# Patient Record
Sex: Female | Born: 1960 | Race: Black or African American | Hispanic: No
Health system: Midwestern US, Community
[De-identification: ages and names within clinical notes are randomized; demographics above are authoritative.]

## PROBLEM LIST (undated history)

## (undated) DIAGNOSIS — T451X5A Adverse effect of antineoplastic and immunosuppressive drugs, initial encounter: Secondary | ICD-10-CM

## (undated) DIAGNOSIS — J9801 Acute bronchospasm: Secondary | ICD-10-CM

## (undated) DIAGNOSIS — R8290 Unspecified abnormal findings in urine: Secondary | ICD-10-CM

## (undated) DIAGNOSIS — J4521 Mild intermittent asthma with (acute) exacerbation: Secondary | ICD-10-CM

## (undated) DIAGNOSIS — G620 Drug-induced polyneuropathy: Secondary | ICD-10-CM

## (undated) DIAGNOSIS — Z794 Long term (current) use of insulin: Secondary | ICD-10-CM

## (undated) DIAGNOSIS — R928 Other abnormal and inconclusive findings on diagnostic imaging of breast: Secondary | ICD-10-CM

## (undated) DIAGNOSIS — R748 Abnormal levels of other serum enzymes: Secondary | ICD-10-CM

## (undated) DIAGNOSIS — E1140 Type 2 diabetes mellitus with diabetic neuropathy, unspecified: Secondary | ICD-10-CM

## (undated) DIAGNOSIS — Z1231 Encounter for screening mammogram for malignant neoplasm of breast: Secondary | ICD-10-CM

## (undated) DIAGNOSIS — G8929 Other chronic pain: Secondary | ICD-10-CM

## (undated) DIAGNOSIS — Z79899 Other long term (current) drug therapy: Secondary | ICD-10-CM

## (undated) DIAGNOSIS — R600 Localized edema: Secondary | ICD-10-CM

## (undated) DIAGNOSIS — M79645 Pain in left finger(s): Secondary | ICD-10-CM

## (undated) DIAGNOSIS — D61818 Other pancytopenia: Secondary | ICD-10-CM

## (undated) DIAGNOSIS — R2689 Other abnormalities of gait and mobility: Secondary | ICD-10-CM

## (undated) DIAGNOSIS — E1165 Type 2 diabetes mellitus with hyperglycemia: Secondary | ICD-10-CM

## (undated) DIAGNOSIS — Z5181 Encounter for therapeutic drug level monitoring: Principal | ICD-10-CM

## (undated) DIAGNOSIS — C9101 Acute lymphoblastic leukemia, in remission: Secondary | ICD-10-CM

## (undated) DIAGNOSIS — E785 Hyperlipidemia, unspecified: Secondary | ICD-10-CM

## (undated) DIAGNOSIS — I10 Essential (primary) hypertension: Secondary | ICD-10-CM

## (undated) DIAGNOSIS — G4700 Insomnia, unspecified: Secondary | ICD-10-CM

## (undated) DIAGNOSIS — E559 Vitamin D deficiency, unspecified: Secondary | ICD-10-CM

## (undated) DIAGNOSIS — IMO0001 Reserved for inherently not codable concepts without codable children: Secondary | ICD-10-CM

## (undated) DIAGNOSIS — E119 Type 2 diabetes mellitus without complications: Secondary | ICD-10-CM

## (undated) DIAGNOSIS — J45909 Unspecified asthma, uncomplicated: Secondary | ICD-10-CM

## (undated) HISTORY — PX: MYOMECTOMY: SHX85

## (undated) HISTORY — PX: LIPOSUCTION: SHX10

---

## 2015-07-11 ENCOUNTER — Observation Stay (HOSPITAL_COMMUNITY)
Admission: EM | Admit: 2015-07-11 | Discharge: 2015-07-12 | Disposition: A | Discharge status: Home / Self Care | Payer: BC Managed Care – PPO | Attending: Internal Medicine | Admitting: Internal Medicine

## 2015-07-11 ENCOUNTER — Encounter (HOSPITAL_COMMUNITY): Payer: Self-pay | Admitting: Neurology

## 2015-07-11 ENCOUNTER — Observation Stay (HOSPITAL_BASED_OUTPATIENT_CLINIC_OR_DEPARTMENT_OTHER): Payer: BC Managed Care – PPO

## 2015-07-11 ENCOUNTER — Emergency Department (HOSPITAL_COMMUNITY): Payer: BC Managed Care – PPO

## 2015-07-11 DIAGNOSIS — R0609 Other forms of dyspnea: Secondary | ICD-10-CM | POA: Insufficient documentation

## 2015-07-11 DIAGNOSIS — R9431 Abnormal electrocardiogram [ECG] [EKG]: Secondary | ICD-10-CM | POA: Diagnosis not present

## 2015-07-11 DIAGNOSIS — I110 Hypertensive heart disease with heart failure: Secondary | ICD-10-CM | POA: Diagnosis not present

## 2015-07-11 DIAGNOSIS — R0789 Other chest pain: Secondary | ICD-10-CM | POA: Diagnosis not present

## 2015-07-11 DIAGNOSIS — I517 Cardiomegaly: Secondary | ICD-10-CM

## 2015-07-11 DIAGNOSIS — R0602 Shortness of breath: Secondary | ICD-10-CM

## 2015-07-11 DIAGNOSIS — R0600 Dyspnea, unspecified: Secondary | ICD-10-CM

## 2015-07-11 DIAGNOSIS — R079 Chest pain, unspecified: Secondary | ICD-10-CM | POA: Diagnosis present

## 2015-07-11 DIAGNOSIS — I5030 Unspecified diastolic (congestive) heart failure: Secondary | ICD-10-CM | POA: Diagnosis not present

## 2015-07-11 DIAGNOSIS — Z794 Long term (current) use of insulin: Secondary | ICD-10-CM | POA: Insufficient documentation

## 2015-07-11 DIAGNOSIS — Z7984 Long term (current) use of oral hypoglycemic drugs: Secondary | ICD-10-CM

## 2015-07-11 DIAGNOSIS — J4520 Mild intermittent asthma, uncomplicated: Secondary | ICD-10-CM | POA: Diagnosis not present

## 2015-07-11 DIAGNOSIS — I10 Essential (primary) hypertension: Secondary | ICD-10-CM

## 2015-07-11 DIAGNOSIS — Z8249 Family history of ischemic heart disease and other diseases of the circulatory system: Secondary | ICD-10-CM | POA: Diagnosis not present

## 2015-07-11 DIAGNOSIS — E119 Type 2 diabetes mellitus without complications: Secondary | ICD-10-CM

## 2015-07-11 DIAGNOSIS — E1165 Type 2 diabetes mellitus with hyperglycemia: Secondary | ICD-10-CM | POA: Diagnosis not present

## 2015-07-11 DIAGNOSIS — J45909 Unspecified asthma, uncomplicated: Secondary | ICD-10-CM

## 2015-07-11 HISTORY — DX: Reserved for inherently not codable concepts without codable children: IMO0001

## 2015-07-11 HISTORY — DX: Unspecified asthma, uncomplicated: J45.909

## 2015-07-11 HISTORY — DX: Essential (primary) hypertension: I10

## 2015-07-11 HISTORY — DX: Type 2 diabetes mellitus without complications: E11.9

## 2015-07-11 LAB — BASIC METABOLIC PANEL
ANION GAP: 12 (ref 5–15)
BUN: 12 mg/dL (ref 6–20)
CALCIUM: 9.3 mg/dL (ref 8.9–10.3)
CO2: 28 mmol/L (ref 22–32)
Chloride: 100 mmol/L — ABNORMAL LOW (ref 101–111)
Creatinine, Ser: 0.94 mg/dL (ref 0.44–1.00)
GFR calc Af Amer: >60 mL/min (ref >60)
GLUCOSE: 197 mg/dL — AB (ref 65–99)
Potassium: 3.9 mmol/L (ref 3.5–5.1)
Sodium: 140 mmol/L (ref 135–145)

## 2015-07-11 LAB — TSH: TSH: 1.962 u[IU]/mL (ref 0.350–4.500)

## 2015-07-11 LAB — GLUCOSE, CAPILLARY
GLUCOSE-CAPILLARY: 237 mg/dL — AB (ref 65–99)
GLUCOSE-CAPILLARY: 221 mg/dL — AB (ref 65–99)

## 2015-07-11 LAB — CBC
HEMATOCRIT: 39.8 % (ref 36.0–46.0)
HEMOGLOBIN: 13.8 g/dL (ref 12.0–15.0)
MCH: 29.9 pg (ref 26.0–34.0)
MCHC: 34.7 g/dL (ref 30.0–36.0)
MCV: 86.1 fL (ref 78.0–100.0)
PLATELETS: 216 10*3/uL (ref 150–400)
RBC: 4.62 MIL/uL (ref 3.87–5.11)
RDW: 14.5 % (ref 11.5–15.5)
WBC: 11.9 10*3/uL — ABNORMAL HIGH (ref 4.0–10.5)

## 2015-07-11 LAB — BRAIN NATRIURETIC PEPTIDE: B NATRIURETIC PEPTIDE 5: 16.3 pg/mL (ref 0.0–100.0)

## 2015-07-11 LAB — TROPONIN I

## 2015-07-11 LAB — I-STAT TROPONIN, ED: Troponin i, poc: 0 ng/mL (ref 0.00–0.08)

## 2015-07-11 LAB — D-DIMER, QUANTITATIVE (NOT AT ARMC): D DIMER QUANT: 0.35 ug{FEU}/mL (ref 0.00–0.50)

## 2015-07-11 MED ORDER — NITROGLYCERIN 0.4 MG SL SUBL
0.4000 mg | SUBLINGUAL_TABLET | SUBLINGUAL | Status: DC | PRN
Start: 2015-07-11 — End: 2015-07-12

## 2015-07-11 MED ORDER — INSULIN DEGLUDEC 200 UNIT/ML ~~LOC~~ SOPN: 20.0000 [IU] | PEN_INJECTOR | Freq: Every evening | SUBCUTANEOUS | Status: DC

## 2015-07-11 MED ORDER — GI COCKTAIL ~~LOC~~
30.0000 mL | Freq: Once | ORAL | Status: AC
  Administered 2015-07-11: 30 mL via ORAL
  Filled 2015-07-11: qty 30

## 2015-07-11 MED ORDER — ONDANSETRON HCL 4 MG/2ML IJ SOLN
4.0000 mg | Freq: Once | INTRAMUSCULAR | Status: AC
  Administered 2015-07-11: 4 mg via INTRAVENOUS
  Filled 2015-07-11: qty 2

## 2015-07-11 MED ORDER — HYDROCHLOROTHIAZIDE 25 MG PO TABS
25.0000 mg | ORAL_TABLET | Freq: Every day | ORAL | Status: DC
  Administered 2015-07-12: 25 mg via ORAL
  Filled 2015-07-11: qty 1

## 2015-07-11 MED ORDER — ENOXAPARIN SODIUM 40 MG/0.4ML ~~LOC~~ SOLN
40.0000 mg | SUBCUTANEOUS | Status: DC
  Administered 2015-07-11: 40 mg via SUBCUTANEOUS
  Filled 2015-07-11: qty 0.4

## 2015-07-11 MED ORDER — ASPIRIN 325 MG PO TABS
325.0000 mg | ORAL_TABLET | Freq: Once | ORAL | Status: AC
  Administered 2015-07-11: 325 mg via ORAL
  Filled 2015-07-11: qty 1

## 2015-07-11 MED ORDER — ALBUTEROL SULFATE (2.5 MG/3ML) 0.083% IN NEBU
2.5000 mg | INHALATION_SOLUTION | Freq: Four times a day (QID) | RESPIRATORY_TRACT | Status: DC | PRN
Start: 2015-07-11 — End: 2015-07-12
  Administered 2015-07-11: 2.5 mg via RESPIRATORY_TRACT
  Filled 2015-07-11: qty 3

## 2015-07-11 MED ORDER — MORPHINE SULFATE (PF) 4 MG/ML IV SOLN
4.0000 mg | Freq: Once | INTRAVENOUS | Status: AC
  Administered 2015-07-11: 4 mg via INTRAVENOUS
  Filled 2015-07-11: qty 1

## 2015-07-11 MED ORDER — IRBESARTAN 300 MG PO TABS
300.0000 mg | ORAL_TABLET | Freq: Every day | ORAL | Status: DC
  Administered 2015-07-12: 300 mg via ORAL
  Filled 2015-07-11: qty 1

## 2015-07-11 MED ORDER — ALBUTEROL SULFATE (2.5 MG/3ML) 0.083% IN NEBU
2.5000 mg | INHALATION_SOLUTION | Freq: Once | RESPIRATORY_TRACT | Status: AC
  Administered 2015-07-11: 2.5 mg via RESPIRATORY_TRACT
  Filled 2015-07-11: qty 3

## 2015-07-11 MED ORDER — MORPHINE SULFATE (PF) 2 MG/ML IV SOLN
2.0000 mg | INTRAVENOUS | Status: DC | PRN
  Administered 2015-07-11: 2 mg via INTRAVENOUS
  Filled 2015-07-11: qty 1

## 2015-07-11 MED ORDER — INSULIN ASPART 100 UNIT/ML ~~LOC~~ SOLN
0.0000 [IU] | Freq: Three times a day (TID) | SUBCUTANEOUS | Status: DC
  Administered 2015-07-11: 3 [IU] via SUBCUTANEOUS
  Administered 2015-07-12: 1 [IU] via SUBCUTANEOUS

## 2015-07-11 MED ORDER — ACETAMINOPHEN 325 MG PO TABS
650.0000 mg | ORAL_TABLET | Freq: Four times a day (QID) | ORAL | Status: DC | PRN
Start: 2015-07-11 — End: 2015-07-12

## 2015-07-11 MED ORDER — SODIUM CHLORIDE 0.9% FLUSH
3.0000 mL | Freq: Two times a day (BID) | INTRAVENOUS | Status: DC
  Administered 2015-07-11 – 2015-07-12 (×2): 3 mL via INTRAVENOUS

## 2015-07-11 MED ORDER — OLMESARTAN MEDOXOMIL-HCTZ 40-25 MG PO TABS: 1.0000 | ORAL_TABLET | Freq: Every day | ORAL | Status: DC

## 2015-07-11 MED ORDER — INSULIN GLARGINE 100 UNIT/ML ~~LOC~~ SOLN
20.0000 [IU] | Freq: Every evening | SUBCUTANEOUS | Status: DC
  Administered 2015-07-11: 20 [IU] via SUBCUTANEOUS
  Filled 2015-07-11 (×2): qty 0.2

## 2015-07-11 MED ORDER — ACETAMINOPHEN 650 MG RE SUPP: 650.0000 mg | Freq: Four times a day (QID) | RECTAL | Status: DC | PRN

## 2015-07-11 MED ORDER — ASPIRIN EC 81 MG PO TBEC
81.0000 mg | DELAYED_RELEASE_TABLET | Freq: Every day | ORAL | Status: DC
  Administered 2015-07-12: 81 mg via ORAL
  Filled 2015-07-11: qty 1

## 2015-07-11 MED ORDER — SODIUM CHLORIDE 0.9% FLUSH: 3.0000 mL | INTRAVENOUS | Status: DC | PRN

## 2015-07-11 NOTE — ED Notes (Signed)
Pt just now reports to RN 6-7 episodes diarrhea per day x 4 days. Now on enteric precautions.

## 2015-07-11 NOTE — ED Notes (Signed)
Pt also reports to RN that switched diabetes insulin pen 1 week ago and associated adverse effect is diarrhea. Ok to discontinue per [place], [place]

## 2015-07-11 NOTE — H&P (Signed)
Date: 07/11/2015               Patient Name:  Shelley Hendricks MRN: [id number]  DOB: 11-05-1960 Age / Sex: 55 y.o., female   PCP: No primary care provider on file.         Medical Service: Internal Medicine Teaching Service         Attending Physician: Dr. Oval Linsey, MD    First Contact: Dr. Viviano Simas Pager: [phone]  Second Contact: Dr. Jacques Earthly Pager: [phone]       After Hours (After 5p/  First Contact Pager: [phone]  weekends / holidays): Second Contact Pager: [phone]   Chief Complaint: chest pain and dyspnea  History of Present Illness: Shelley Hendricks is a 55 year old woman with PMH of HTN, DM and asthma here with c/o of chest pain and dyspnea.  Chest pain started 3-4 days ago.  Left sided, sharp, 8/10 intermittent pain beneath left breast.  Would occur q36min and then resolve. Also w/ central non-radiating chest tightness, dry cough, dyspnea (at rest and with exertion), diaphoresis since yesterday.  The pain is not positional or pleuritic.  She denies sore throat, rhinorrhea, wheezing, fever/chills, PND, orthopnea, LE edema, calf pain, lightheadedness or syncope.  Sharp pain resolved in ED.  She intially thought this was an asthma attack.  She has not had an asthma attack in about 3 years. She has not been hospitalized or intubated for asthma since childhood.  She has not required maintenance medications for asthma in over 20 years.  She has old Qvar and albuterol at home that are expired.    Highest value was 212. Recently changed from Lantus to [place] 1 week ago (insurance formulary change; this was approved by her previous PCP in [place]). She denies tobacco, alcholol or drug use.  Moved from [place] 2 months ago for new job.  Made the 5 hour drive at that time but no long drives or plane trips since then.  She denies cardiac hx, autoimmune hx, radiation exposure, occupational exposure to fumes.  She is menopausal.  No family hx of cardiac problem.    Meds: Current  Facility-Administered Medications  Medication Dose Route Frequency Provider Last Rate Last Dose  . nitroGLYCERIN (NITROSTAT) SL tablet 0.4 mg  0.4 mg Sublingual Q5 min PRN Mercedes Camprubi-Soms, PA-C       Current Outpatient Prescriptions  Medication Sig Dispense Refill  . BLACK COHOSH PO Take 1 tablet by mouth daily.    . cholecalciferol (VITAMIN D) 1000 units tablet Take 1,000 Units by mouth daily.    . Evening Primrose Oil 500 MG CAPS Take 500 mg by mouth daily.    . Insulin Degludec (TRESIBA FLEXTOUCH) 200 UNIT/ML SOPN Inject 30 Units into the skin daily.    . metFORMIN (GLUCOPHAGE-XR) 500 MG 24 hr tablet Take 500 mg by mouth 2 (two) times daily.    Marland Kitchen olmesartan-hydrochlorothiazide (BENICAR HCT) 40-25 MG tablet Take 1 tablet by mouth daily.    Marland Kitchen pyridOXINE (VITAMIN B-6) 100 MG tablet Take 100 mg by mouth daily.    Marland Kitchen saccharomyces boulardii (FLORASTOR) 250 MG capsule Take 250 mg by mouth 2 (two) times daily.    . vitamin C (ASCORBIC ACID) 250 MG tablet Take 250 mg by mouth daily.      Allergies: Allergies as of 07/11/2015 - Review Complete 07/11/2015  Allergen Reaction Noted  . Penicillins Hives 07/11/2015   Past Medical History  Diagnosis Date  . Diabetes mellitus without complication ([place])   .  Hypertension   . Asthma    Family Hx:  No heart attack, heart failure or sudden cardiac death.  Social History   Social History  . Marital Status: Single    Spouse Name: N/A  . Number of Children: N/A  . Years of Education: N/A   Occupational History  . Not on file.   Social History Main Topics  . Smoking status: Never Smoker   . Smokeless tobacco: Not on file  . Alcohol Use: No  . Drug Use: Not on file  . Sexual Activity: Not on file   Other Topics Concern  . Not on file   Social History Narrative  . No narrative on file    Review of Systems: General:  Denies fever, decreased appetite, weight loss, night sweats, fatigue.  She has gained about 7 pounds in past  month. HEENT:  Denies URI symptoms Cards:  Per HPI Pulm:  Per HPI GI:  Denies abdominal pain, N/V, dark or bloody stools; she reports diarrhea with BID metformin only GU:  Denies decreased urination, dysuria or hematuria. Neuro:  Denies weakness, numbness/tingling, gait abnormalities.  Physical Exam: Blood pressure 122/85, pulse 61, temperature 98.1 F (36.7 C), temperature source Oral, resp. rate 13, height 5' (1.524 m), weight 205 lb (92.987 kg), SpO2 100 %. General: resting in bed in NAD HEENT: PERRL, EOMI, no scleral icterus, OP clear and moist, no thyromegaly Cardiac: RRR, no rubs, murmurs or gallops, no chest tenderness, no carotid bruits, no JVD Pulm: speaking in full sentences without problem, no accessory muscle use or respiratory distress, clear to auscultation bilaterally, moving normal volumes of air Abd: soft, obese, nontender, nondistended, BS present Ext: warm and well perfused, no pedal edema, 2+ radials and DPs Neuro: alert and oriented X3, cranial nerves II-XII grossly intact, responding appropriately, following commands, 5/5 upper and lower extremity strength B/L, sensation grossly intact B/L  Lab results: Basic Metabolic Panel:  Recent Labs  07/11/15 0912  NA 140  K 3.9  CL 100*  CO2 28  GLUCOSE 197*  BUN 12  CREATININE 0.94  CALCIUM 9.3   CBC:  Recent Labs  07/11/15 0912  WBC 11.9*  HGB 13.8  HCT 39.8  MCV 86.1  PLT 216   Cardiac Enzymes:  poc troponin 0.00  BNP:  16.3  D-Dimer:  Recent Labs  07/11/15 0900  DDIMER 0.35   Imaging results:  Dg Chest 2 View  07/11/2015  CLINICAL DATA:  Chest pains. EXAM: CHEST  2 VIEW COMPARISON:  No prior. FINDINGS: Mediastinum and hilar structures normal. Low lung volumes. Cardiomegaly with normal pulmonary vascularity. No pleural effusion or pneumothorax. Degenerative changes thoracic spine. IMPRESSION: 1. Cardiomegaly.  No pulmonary venous congestion. 2. No acute pulmonary disease. Electronically Signed    By: Marcello Moores  Register   On: 07/11/2015 09:12    Other results: EKG:  NSR, 73 bpm, normal intervals, non-specific TW flattening inferior and lateral leads.  No prior EKG for comparison.  Assessment & Plan by Problem: 55 year old woman with PMH of HTN, DM and asthma here with c/o of chest pain and dyspnea.  Chest pain:  She describes two different types of pain:  Left sided sharp pain that has resolved and central chest pressure that is still present and associated with dyspnea.  HEART score 4 (12-16% six week MACE).  No tachypnea or wheezing to suggest asthma exacerbation.  D-dimer is negative which makes PE very unlikely.  No abnormalities on CXR to suggest pneumonia.  No GI  symptoms to suggest GI etiology.   Initial trop neg and EKG non-diagnostic but description of chest tightness and risk factors for ACS warranting admission for observation, trending troponin and consideration of inpatient stress. - telemetry monitoring  - trend troponin, check TSH and UDS - AM EKG - prn morphine, NTG - daily ASA - checking lipid panel, likely need statin in this patient with DM - 2D ECHO   Mild intermittent asthma:  No wheezing or vital sign abnormalities to suggest exacerbation.  Certainly asthma exacerbation could explain her chest tightness and dyspnea but she reports well controlled asthma w/o need for maintenance or rescue med in many years.  She says her "attacks" usually resolve with drinking water which would not be c/w true bronchospasm. - check peak flow - prn supplemental oxygen - prn albuterol nebs  Type 2 diabetes mellitus:  On Tresiba and metformin at home.  Last A1c 8.13 Apr 2015 and insulin increased 16-->30 and metformin. Skips 2nd metformin on most days due to diarrhea.  AM CBGs have been 150-200s.  She reports diarrhea when she takes BID metformin so she just takes it daily on most days of the week. - continue Tresiba at 2/3 home dose - add SSI-S - hold metformin in case she requires  contrast studies/intervention  HTN:  Normotensive - continue home [id number]  Diet:  Carb mod VTE ppx: [place] Lovenox Code status:  Full code confirmed with the patient.   Dispo: Disposition is deferred at this time, awaiting improvement of current medical problems. Anticipated discharge in approximately 1-2 day(s).   The patient does not have a current PCP (No primary care provider on file.) and does need an [facility] hospital follow-up appointment after discharge.  The patient does not have transportation limitations that hinder transportation to clinic appointments.  Signed: Francesca Oman, DO 07/11/2015, 10:43 AM

## 2015-07-11 NOTE — ED Notes (Signed)
Pt made aware of bed assignment 

## 2015-07-11 NOTE — Progress Notes (Signed)
Echocardiogram 2D Echocardiogram has been performed.  Shelley Hendricks 07/11/2015, 3:04 PM

## 2015-07-11 NOTE — ED Notes (Addendum)
Pt reports left sided cp under her left breast for 2 days, no radiation, feeling nauseated and sweaty. Pt is ambulatory, is a x 4. Denies cardiac hx. Is not having any cp at current.

## 2015-07-11 NOTE — ED Provider Notes (Signed)
CSN: [id number]     Arrival date & time 07/11/15  [id number] History   First MD Initiated Contact with Patient 07/11/15 [phone]     Chief Complaint  Patient presents with  . Chest Pain     (Consider location/radiation/quality/duration/timing/severity/associated sxs/prior Treatment) HPI Comments: Shelley Hendricks is a 55 y.o. female with a PMHx of DM2, HTN, and asthma, who presents to the ED with complaints of gradual onset chest pain 2-3 days. She states she cannot recall what she was doing when the pain started, but describes the pain as 8/10 sharp left-sided chest pain underneath her left breast, intermittent and nonradiating, currently resolved, with no known aggravating factors, unchanged with exertion/inspiration or movement, and with no treatments tried prior to arrival. Associated symptoms include constant shortness of breath, nausea, and intermittent diaphoresis which mostly occurs with exertion. She also reports 4 days of diarrhea which she describes as having 6-7 episodes daily of looser than normal but not watery, nonbloody stools. She is a nonsmoker. Positive family history of MI in her maternal aunt but no other family history of cardiac disease.  She denies any fevers, chills, cough, lightheadedness, claudication, orthopnea, leg swelling, recent travel/surgery/immobilization, estrogen use, personal or family history of DVT/PE, abdominal pain, vomiting, constipation, obstipation, melena, hematochezia, dysuria, hematuria, numbness, tingling, or weakness. She has no personal hx of cardiac disease that she knows of. She just recently moved to the area, does not have a PCP here yet.  Of note, after interview, pt told the nurse that she just started on a new diabetes med, which causes diarrhea, which would explain her diarrhea.  Patient is a 55 y.o. female presenting with chest pain. The history is provided by the patient. No language interpreter was used.  Chest Pain Pain location:  L chest Pain  quality: sharp   Pain radiates to:  Does not radiate Pain radiates to the back: no   Pain severity:  Moderate Onset quality:  Gradual Duration:  2 days Timing:  Intermittent Progression:  Waxing and waning Chronicity:  New Relieved by:  None tried Worsened by:  Nothing tried Ineffective treatments:  None tried Associated symptoms: diaphoresis (intermittent, mostly with exertion), nausea and shortness of breath   Associated symptoms: no abdominal pain, no claudication, no cough, no fever, no lower extremity edema, no numbness, no orthopnea, not vomiting and no weakness   Risk factors: diabetes mellitus, hypertension and obesity   Risk factors: no birth control, no coronary artery disease, no immobilization, no prior DVT/PE, no smoking and no surgery     Past Medical History  Diagnosis Date  . Diabetes mellitus without complication ([place])   . Hypertension   . Asthma    History reviewed. No pertinent past surgical history. No family history on file. Social History  Substance Use Topics  . Smoking status: Never Smoker   . Smokeless tobacco: None  . Alcohol Use: No   OB History    No data available     Review of Systems  Constitutional: Positive for diaphoresis (intermittent, mostly with exertion). Negative for fever and chills.  Respiratory: Positive for shortness of breath. Negative for cough.   Cardiovascular: Positive for chest pain. Negative for orthopnea, claudication and leg swelling.  Gastrointestinal: Positive for nausea and diarrhea. Negative for vomiting, abdominal pain, constipation and blood in stool.  Genitourinary: Negative for dysuria and hematuria.  Musculoskeletal: Negative for myalgias and arthralgias.  Skin: Negative for color change.  Allergic/Immunologic: Positive for immunocompromised state (diabetic).  Neurological: Negative for weakness,  light-headedness and numbness.  Psychiatric/Behavioral: Negative for confusion.   10 Systems reviewed and are  negative for acute change except as noted in the HPI.    Allergies  Penicillins  Home Medications   Prior to Admission medications   Medication Sig Start Date End Date Taking? Authorizing Provider  BLACK COHOSH PO Take 1 tablet by mouth daily.   Yes Historical Provider, MD  cholecalciferol (VITAMIN D) 1000 units tablet Take 1,000 Units by mouth daily.   Yes Historical Provider, MD  Evening Primrose Oil 500 MG CAPS Take 500 mg by mouth daily.   Yes Historical Provider, MD  Insulin Degludec (TRESIBA FLEXTOUCH) 200 UNIT/ML SOPN Inject 30 Units into the skin daily.   Yes Historical Provider, MD  metFORMIN (GLUCOPHAGE-XR) 500 MG 24 hr tablet Take 500 mg by mouth 2 (two) times daily.   Yes Historical Provider, MD  olmesartan-hydrochlorothiazide (BENICAR HCT) 40-25 MG tablet Take 1 tablet by mouth daily.   Yes Historical Provider, MD  pyridOXINE (VITAMIN B-6) 100 MG tablet Take 100 mg by mouth daily.   Yes Historical Provider, MD  saccharomyces boulardii (FLORASTOR) 250 MG capsule Take 250 mg by mouth 2 (two) times daily.   Yes Historical Provider, MD  vitamin C (ASCORBIC ACID) 250 MG tablet Take 250 mg by mouth daily.   Yes Historical Provider, MD   BP 129/83 mmHg  Pulse 75  Temp(Src) 98.1 F (36.7 C) (Oral)  Resp 14  Ht 5' (1.524 m)  Wt 92.987 kg  BMI 40.04 kg/m2  SpO2 95% Physical Exam  Constitutional: She is oriented to person, place, and time. Vital signs are normal. She appears well-developed and well-nourished.  Non-toxic appearance. No distress.  Afebrile, nontoxic, NAD  HENT:  Head: Normocephalic and atraumatic.  Mouth/Throat: Oropharynx is clear and moist and mucous membranes are normal.  Eyes: Conjunctivae and EOM are normal. Right eye exhibits no discharge. Left eye exhibits no discharge.  Neck: Normal range of motion. Neck supple.  Cardiovascular: Normal rate, regular rhythm, normal heart sounds and intact distal pulses.  Exam reveals no gallop and no friction rub.   No  murmur heard. RRR, nl s1/s2, no m/r/g, distal pulses intact, no pedal edema   Pulmonary/Chest: Effort normal and breath sounds normal. No respiratory distress. She has no decreased breath sounds. She has no wheezes. She has no rhonchi. She has no rales. She exhibits no tenderness, no crepitus, no deformity and no retraction.  CTAB in all lung fields, no w/r/r, no hypoxia or increased WOB, speaking in full sentences, SpO2 95-98% on RA  Chest wall nonTTP without crepitus, deformities, or retractions   Abdominal: Soft. Normal appearance and bowel sounds are normal. She exhibits no distension. There is no tenderness. There is no rigidity, no rebound, no guarding, no CVA tenderness, no tenderness at McBurney's point and negative Murphy's sign.  Musculoskeletal: Normal range of motion.  MAE x4 Strength and sensation grossly intact Distal pulses intact No pedal edema, neg homan's bilaterally   Neurological: She is alert and oriented to person, place, and time. She has normal strength. No sensory deficit.  Skin: Skin is warm, dry and intact. No rash noted.  Psychiatric: She has a normal mood and affect.  Nursing note and vitals reviewed.   ED Course  Procedures (including critical care time) Labs Review Labs Reviewed  BASIC METABOLIC PANEL - Abnormal; Notable for the following:    Chloride 100 (*)    Glucose, Bld 197 (*)    All other components within  normal limits  CBC - Abnormal; Notable for the following:    WBC 11.9 (*)    All other components within normal limits  D-DIMER, QUANTITATIVE (NOT AT [facility])  BRAIN NATRIURETIC PEPTIDE  Randolm Idol, ED    Imaging Review Dg Chest 2 View  07/11/2015  CLINICAL DATA:  Chest pains. EXAM: CHEST  2 VIEW COMPARISON:  No prior. FINDINGS: Mediastinum and hilar structures normal. Low lung volumes. Cardiomegaly with normal pulmonary vascularity. No pleural effusion or pneumothorax. Degenerative changes thoracic spine. IMPRESSION: 1. Cardiomegaly.  No  pulmonary venous congestion. 2. No acute pulmonary disease. Electronically Signed   By: Marcello Moores  Register   On: 07/11/2015 09:12   I have personally reviewed and evaluated these images and lab results as part of my medical decision-making.   EKG Interpretation   Date/Time:  Tuesday July 11 2015 08:51:52 EST Ventricular Rate:  73 PR Interval:  166 QRS Duration: 70 QT Interval:  372 QTC Calculation: 409 R Axis:   75 Text Interpretation:  Normal sinus rhythm Nonspecific T wave abnormality  Inferolateral leads Abnormal ECG No previous tracing Confirmed by KNOTT  MD, DANIEL [id number]) on 07/11/2015 9:23:16 AM      MDM   Final diagnoses:  Chest pain of uncertain etiology  SOB (shortness of breath)  Cardiomegaly  Type 2 diabetes mellitus with hyperglycemia, with long-term current use of insulin (HCC)  Abnormal EKG    55 y.o. female here with CP that began 2 days ago, gradual onset, with associated nausea and intermittent diaphoresis, has also had diarrhea x4 days (unclear if this is even related). EKG with NSR but flat T waves in inferolateral leads, some TWI in LL3, no prior EKG to compare. Pt with no hypoxia or tachycardia, no LE swelling, no RFs for PE, therefore low-risk, but given the CP and SOB will obtain Ddimer. Thus far, CBC with mildly elevated WBC at 11.9 (unclear etiology), trop neg (but pain has been intermittent so one troponin doesn't necessarily rule her out). BMP pending. CXR with mild cardiomegaly but no pulm congestion. Will add on BNP, dimer, and stool PCR if possible. Will give ASA and GI cocktail, then try morphine and NTG if pain persists (although pt with no pain at this time). +FHx of MI in her maternal aunt, and pt with several RFs for cardiac disease, therefore may consider overnight OBS for ACS r/o since pt's HEART score is 4, but will reassess shortly to see how she feels after remaining work up completed.   9:56 AM Pt just told the nurse that she just started on  a new diabetes med, which causes diarrhea, which would explain her diarrhea. Will cancel GI panel and enteric precautions. BMP returning which shows gluc 197s but otherwise WNL. Awaiting other labs. Of note, nurse also reports that when she went to medicate pt, the sharp CP had returned again. Will continue to monitor.   10:27 AM D-dimer neg, which is reassuring. BNP still pending, but given the HEART score of 4, with several cardiac risk factors, and intermittent CP, would like to admit for OBS for ACS r/o. Will page out for unassigned admission. Of note, pain is resolved after ASA/morphine/GI cocktail, still feels SOB but no pain returned. Discussed case with attending Dr. Laneta Simmers who agrees with plan  10:43 AM Dr. Redmond Pulling of IM residency practice returning page, agrees with admission to obs, tele bed requested. Holding orders placed. Please see admission notes for further documentation of care. Pt stable at this time.  BP 122/85 mmHg  Pulse 61  Temp(Src) 98.1 F (36.7 C) (Oral)  Resp 13  Ht 5' (1.524 m)  Wt 92.987 kg  BMI 40.04 kg/m2  SpO2 100%  Meds ordered this encounter  Medications  . aspirin tablet 325 mg    Sig:   . gi cocktail (Maalox,Lidocaine,Donnatal)    Sig:   . morphine 4 MG/ML injection 4 mg    Sig:   . nitroGLYCERIN (NITROSTAT) SL tablet 0.4 mg    Sig:   . ondansetron (ZOFRAN) injection 4 mg    Sig:      Shelley Santa Camprubi-Soms, PA-C 07/11/15 1045  Leo Grosser, MD 07/11/15 1104

## 2015-07-11 NOTE — ED Notes (Signed)
Attempted report x1. 

## 2015-07-12 ENCOUNTER — Telehealth: Payer: Self-pay | Admitting: Internal Medicine

## 2015-07-12 DIAGNOSIS — Z794 Long term (current) use of insulin: Secondary | ICD-10-CM

## 2015-07-12 DIAGNOSIS — E119 Type 2 diabetes mellitus without complications: Secondary | ICD-10-CM

## 2015-07-12 DIAGNOSIS — I5030 Unspecified diastolic (congestive) heart failure: Secondary | ICD-10-CM

## 2015-07-12 DIAGNOSIS — J45909 Unspecified asthma, uncomplicated: Secondary | ICD-10-CM | POA: Diagnosis not present

## 2015-07-12 DIAGNOSIS — R0789 Other chest pain: Secondary | ICD-10-CM

## 2015-07-12 DIAGNOSIS — R079 Chest pain, unspecified: Secondary | ICD-10-CM | POA: Insufficient documentation

## 2015-07-12 DIAGNOSIS — I110 Hypertensive heart disease with heart failure: Secondary | ICD-10-CM | POA: Diagnosis not present

## 2015-07-12 DIAGNOSIS — E1165 Type 2 diabetes mellitus with hyperglycemia: Secondary | ICD-10-CM | POA: Insufficient documentation

## 2015-07-12 LAB — LIPID PANEL
CHOL/HDL RATIO: 3.2 ratio
CHOLESTEROL: 154 mg/dL (ref 0–200)
HDL: 48 mg/dL (ref >40)
LDL Cholesterol: 82 mg/dL (ref 0–99)
TRIGLYCERIDES: 119 mg/dL (ref <150)
VLDL: 24 mg/dL (ref 0–40)

## 2015-07-12 LAB — HEMOGLOBIN A1C
Hgb A1c MFr Bld: 8.4 % — ABNORMAL HIGH (ref 4.8–5.6)
Mean Plasma Glucose: 194 mg/dL

## 2015-07-12 LAB — TROPONIN I: Troponin I: <0.03 ng/mL (ref <0.031)

## 2015-07-12 LAB — GLUCOSE, CAPILLARY: GLUCOSE-CAPILLARY: 141 mg/dL — AB (ref 65–99)

## 2015-07-12 MED ORDER — ALBUTEROL SULFATE HFA 108 (90 BASE) MCG/ACT IN AERS: 2.0000 | INHALATION_SPRAY | Freq: Four times a day (QID) | RESPIRATORY_TRACT | Status: AC | PRN

## 2015-07-12 MED ORDER — ASPIRIN 81 MG PO TBEC: 81.0000 mg | DELAYED_RELEASE_TABLET | Freq: Every day | ORAL | Status: AC

## 2015-07-12 MED ORDER — ATORVASTATIN CALCIUM 20 MG PO TABS: 20.0000 mg | ORAL_TABLET | Freq: Every day | ORAL | Status: DC

## 2015-07-12 NOTE — Discharge Instructions (Signed)
1. Use Albuterol 2 puffs every 6 hours as needed for difficulty breathing or chest tightness. 2. Start Atorvastatin 20 mg every evening for cholesterol. 3. Take Aspirin 81 mg daily. 4. Follow up with Cardiologist at [facility] for stress test. 5. Continue Metformin.  Try to take the medication twice daily.

## 2015-07-12 NOTE — Progress Notes (Signed)
Subjective: [name]. Patient reports resolution of breathing symptoms after her albuterol nebulizer.  She denies repeat sharp chest pain.  She feels good and is ready for discharge.  Objective: Vital signs in last 24 hours: Filed Vitals:   07/11/15 1359 07/11/15 2003 07/11/15 2217 07/12/15 0432  BP:  129/65  108/70  Pulse:  72  73  Temp:  98.4 F (36.9 C)  97.8 F (36.6 C)  TempSrc:  Oral  Oral  Resp:  16  16  Height:      Weight:      SpO2: 98% 97% 98% 97%   Weight change:   Intake/Output Summary (Last 24 hours) at 07/12/15 1105 Last data filed at 07/12/15 1023  Gross per 24 hour  Intake    480 ml  Output      0 ml  Net    480 ml   Physical Exam  Constitutional: She is oriented to person, place, and time and well-developed, well-nourished, and in no distress. No distress.  HENT:  Head: Normocephalic and atraumatic.  Eyes: EOM are normal. No scleral icterus.  Neck: No JVD present. No tracheal deviation present.  Cardiovascular: Normal rate, regular rhythm, normal heart sounds and intact distal pulses.   Pulmonary/Chest: Effort normal and breath sounds normal. No stridor. No respiratory distress. She has no wheezes.  Abdominal: Soft. She exhibits no distension. There is no tenderness. There is no rebound and no guarding.  Musculoskeletal: She exhibits no edema.  Neurological: She is alert and oriented to person, place, and time.  Skin: Skin is warm and dry. No rash noted. She is not diaphoretic.    Lab Results: Basic Metabolic Panel:  Recent Labs Lab 07/11/15 0912  NA 140  K 3.9  CL 100*  CO2 28  GLUCOSE 197*  BUN 12  CREATININE 0.94  CALCIUM 9.3   Liver Function Tests: No results for input(s): AST, ALT, ALKPHOS, BILITOT, PROT, ALBUMIN in the last 168 hours. No results for input(s): LIPASE, AMYLASE in the last 168 hours. No results for input(s): AMMONIA in the last 168 hours. CBC:  Recent Labs Lab 07/11/15 0912  WBC 11.9*  HGB 13.8  HCT 39.8  MCV  86.1  PLT 216   Cardiac Enzymes:  Recent Labs Lab 07/11/15 1229 07/11/15 1712 07/12/15  TROPONINI <0.03 <0.03 <0.03   BNP: No results for input(s): PROBNP in the last 168 hours. D-Dimer:  Recent Labs Lab 07/11/15 0900  DDIMER 0.35   CBG:  Recent Labs Lab 07/11/15 1624 07/11/15 2002 07/12/15 0600  GLUCAP 237* 221* 141*   Hemoglobin A1C:  Recent Labs Lab 07/11/15 1229  HGBA1C 8.4*   Fasting Lipid Panel:  Recent Labs Lab 07/12/15 0312  CHOL 154  HDL 48  LDLCALC 82  TRIG 119  CHOLHDL 3.2   Thyroid Function Tests:  Recent Labs Lab 07/11/15 1229  TSH 1.962   Coagulation: No results for input(s): LABPROT, INR in the last 168 hours. Anemia Panel: No results for input(s): VITAMINB12, FOLATE, FERRITIN, TIBC, IRON, RETICCTPCT in the last 168 hours. Urine Drug Screen: Drugs of Abuse  No results found for: LABOPIA, COCAINSCRNUR, LABBENZ, AMPHETMU, THCU, LABBARB  Alcohol Level: No results for input(s): ETH in the last 168 hours. Urinalysis: No results for input(s): COLORURINE, LABSPEC, PHURINE, GLUCOSEU, HGBUR, BILIRUBINUR, KETONESUR, PROTEINUR, UROBILINOGEN, NITRITE, LEUKOCYTESUR in the last 168 hours.  Invalid input(s): APPERANCEUR Misc. Labs:   Micro Results: No results found for this or any previous visit (from the past 240 hour(s)). Studies/Results:  Dg Chest 2 View  07/11/2015  CLINICAL DATA:  Chest pains. EXAM: CHEST  2 VIEW COMPARISON:  No prior. FINDINGS: Mediastinum and hilar structures normal. Low lung volumes. Cardiomegaly with normal pulmonary vascularity. No pleural effusion or pneumothorax. Degenerative changes thoracic spine. IMPRESSION: 1. Cardiomegaly.  No pulmonary venous congestion. 2. No acute pulmonary disease. Electronically Signed   By: Marcello Moores  Register   On: 07/11/2015 09:12   Medications: I have reviewed the patient's current medications. Scheduled Meds: . aspirin EC  81 mg Oral Daily  . enoxaparin (LOVENOX) injection  40 mg  Subcutaneous Q24H  . irbesartan  300 mg Oral Daily   And  . hydrochlorothiazide  25 mg Oral Daily  . insulin aspart  0-9 Units Subcutaneous TID WC  . insulin glargine  20 Units Subcutaneous QPM  . sodium chloride flush  3 mL Intravenous Q12H   Continuous Infusions:  PRN Meds:.acetaminophen **OR** acetaminophen, albuterol, morphine injection, nitroGLYCERIN, sodium chloride flush Assessment/Plan: Active Problems:   Chest pain   Asthma   Type 2 diabetes mellitus ([place])   Essential hypertension  [name] is a 55 year old woman with PMH of HTN, DM and asthma here with c/o of chest pain and dyspnea.  Atypical Chest pain: Troponins negative and ECG normal.  Chest pain resolved today.  Etiologies likely two different sources: asthma causing chest tightness and possibly resulting in the sharp, nonradiating, MSK chest pain.  However, echo concerning for grade 1 diastolic dysfunction.  Lipid panel is normal (ASCVD risk 5.4%).  - Cardiology outpatient follow up for possible stress test and management of HFpEF - daily ASA - Start Atorvastatin 20 mg daily  HFpEF: Echo showing grade 1 diastolic dysfunction and EF 65-70%.  Patient has h/o HTN, currently well controlled on Olmesartan-HCTZ.  Will have patient follow up with Cardiology. - ASA - Atorvastatin 20 mg - Resume Olmesartan-HCTZ on discharge  Mild intermittent asthma:Chest tightness relieved with albuterol.  Less likely asthma exacerbation than poor control.   Will discharge with new prescription for albuterol. - prn albuterol nebs  Type 2 diabetes mellitus: On Tresiba and metformin at home. Last A1c 8.13 Apr 2015 and insulin increased 16-->30 and metformin. Skips 2nd metformin on most days due to diarrhea. AM CBGs have been 150-200s. She reports diarrhea when she takes BID metformin so she just takes it daily on most days of the week. - continue Tresiba at 2/3 home dose and SSI  HTN: Normotensive - continue home ARB-HCTZ  Diet:  Carb mod VTE ppx: [place] Lovenox Code status: Full code confirmed with the patient.   Dispo: Disposition is deferred at this time, awaiting improvement of current medical problems.  Anticipated discharge in approximately 0 day(s).   The patient does not have a current PCP (No primary care provider on file.) and does need an [facility] hospital follow-up appointment after discharge.  The patient does not have transportation limitations that hinder transportation to clinic appointments.  .Services Needed at time of discharge: Y = Yes, Blank = No PT:   OT:   RN:   Equipment:   Other:       Shelley Oven, MD 07/12/2015, 11:05 AM

## 2015-07-12 NOTE — Discharge Summary (Signed)
Name: Shelley Hendricks MRN: [id number] DOB: 24-May-1960 55 y.o. PCP: No primary care provider on file.  Date of Admission: 07/11/2015  8:59 AM Date of Discharge: 07/12/2015 Attending Physician: Oval Linsey, MD  Discharge Diagnosis: 1. Atypical chest pain 2. Asthma 3. HFpEF   Active Problems:   Chest pain   Asthma   Type 2 diabetes mellitus ([place])   Essential hypertension  Discharge Medications:   Medication List    TAKE these medications        albuterol 108 (90 Base) MCG/ACT inhaler  Commonly known as:  PROVENTIL HFA;VENTOLIN HFA  Inhale 2 puffs into the lungs every 6 (six) hours as needed for wheezing or shortness of breath.     aspirin 81 MG EC tablet  Take 1 tablet (81 mg total) by mouth daily.     atorvastatin 20 MG tablet  Commonly known as:  LIPITOR  Take 1 tablet (20 mg total) by mouth daily.     BLACK COHOSH PO  Take 1 tablet by mouth daily.     cholecalciferol 1000 units tablet  Commonly known as:  VITAMIN D  Take 1,000 Units by mouth daily.     Evening Primrose Oil 500 MG Caps  Take 500 mg by mouth daily.     metFORMIN 500 MG 24 hr tablet  Commonly known as:  GLUCOPHAGE-XR  Take 500 mg by mouth 2 (two) times daily.     olmesartan-hydrochlorothiazide 40-25 MG tablet  Commonly known as:  BENICAR HCT  Take 1 tablet by mouth daily.     pyridOXINE 100 MG tablet  Commonly known as:  VITAMIN B-6  Take 100 mg by mouth daily.     saccharomyces boulardii 250 MG capsule  Commonly known as:  FLORASTOR  Take 250 mg by mouth 2 (two) times daily.     TRESIBA FLEXTOUCH 200 UNIT/ML Sopn  Generic drug:  Insulin Degludec  Inject 30 Units into the skin daily.     vitamin C 250 MG tablet  Commonly known as:  ASCORBIC ACID  Take 250 mg by mouth daily.        Disposition and follow-up:   Shelley Hendricks was discharged from [facility] in Good condition.  At the hospital follow up visit please address:  1.  Diabetes control, asthma  control, diabetes management, Cardiology follow up  2.  Labs / imaging needed at time of follow-up: BMP  3.  Pending labs/ test needing follow-up: none  Follow-up Appointments:     Follow-up Information    Follow up with Shelley Hendricks On 07/27/2015.   Why:  9:00 a for stress test   Contact information:   Shel[facility] [phone]      Follow up with Nahser, Wonda Cheng, MD On 08/02/2015.   Specialty:  Cardiology   Why:  2:30 pm   Contact information:   [facility] [address] [address] [phone]       Follow up with Shelley Nims, MD On 07/26/2015.   Specialty:  Internal Medicine   Why:  2:15 pm   Contact information:   [place] [address] [phone]       Discharge Instructions: Discharge Instructions    Call MD for:  difficulty breathing, headache or visual disturbances    Complete by:  As directed      Call MD for:  redness, tenderness, or signs of infection (pain,  swelling, redness, odor or green/yellow discharge around incision site)    Complete by:  As directed      Diet - low sodium heart healthy    Complete by:  As directed      Increase activity slowly    Complete by:  As directed            Consultations:    Procedures Performed:  Dg Chest 2 View  07/11/2015  CLINICAL DATA:  Chest pains. EXAM: CHEST  2 VIEW COMPARISON:  No prior. FINDINGS: Mediastinum and hilar structures normal. Low lung volumes. Cardiomegaly with normal pulmonary vascularity. No pleural effusion or pneumothorax. Degenerative changes thoracic spine. IMPRESSION: 1. Cardiomegaly.  No pulmonary venous congestion. 2. No acute pulmonary disease. Electronically Signed   By: Marcello Moores  Register   On: 07/11/2015 09:12    2D Echo: Study Conclusions  - Left ventricle: The cavity size was normal. Wall thickness was normal. Systolic function was vigorous. The estimated  ejection fraction was in the range of 65% to 70%. Wall motion was normal; there were no regional wall motion abnormalities. Doppler parameters are consistent with abnormal left ventricular relaxation (grade 1 diastolic dysfunction). LV mid-cavity gradient to peak 30 mmHg. - Aortic valve: There was no stenosis. - Mitral valve: There was no significant regurgitation. - Right ventricle: The cavity size was normal. Systolic function was normal. - Tricuspid valve: Peak RV-RA gradient (S): 31 mm Hg. - Pulmonary arteries: PA peak pressure: 34 mm Hg (S). - Inferior vena cava: The vessel was normal in size. The respirophasic diameter changes were in the normal range (>= 50%), consistent with normal central venous pressure.  Impressions:  - Normal LV size with vigorous systolic function, EF [id number]. There was an LV mid-cavity gradient to 30 mmHg. Normal RV size and systolic function. No valvular aortic stenosis.   Cardiac Cath:   Admission HPI: [name] is a 55 year old woman with PMH of HTN, DM and asthma here with c/o of chest pain and dyspnea. Chest pain started 3-4 days ago. Left sided, sharp, 8/10 intermittent pain beneath left breast. Would occur q83min and then resolve. Also w/ central non-radiating chest tightness, dry cough, dyspnea (at rest and with exertion), diaphoresis since yesterday. The pain is not positional or pleuritic. She denies sore throat, rhinorrhea, wheezing, fever/chills, PND, orthopnea, LE edema, calf pain, lightheadedness or syncope. Sharp pain resolved in ED. She intially thought this was an asthma attack. She has not had an asthma attack in about 3 years. She has not been hospitalized or intubated for asthma since childhood. She has not required maintenance medications for asthma in over 20 years. She has old Qvar and albuterol at home that are expired.   Highest value was 212. Recently changed from Lantus to [place] 1 week ago (insurance  formulary change; this was approved by her previous PCP in [place]). She denies tobacco, alcholol or drug use. Moved from [place] 2 months ago for new job. Made the 5 hour drive at that time but no long drives or plane trips since then. She denies cardiac hx, autoimmune hx, radiation exposure, occupational exposure to fumes. She is menopausal. No family hx of cardiac problem.    Hospital Course by problem list: Active Problems:   Chest pain   Asthma   Type 2 diabetes mellitus ([facility])   Essential hypertension   Atypical Chest Pain: Patient's chest tightness resolved with albuterol nebulizer.  Etiology of patient's left sided, intermittent, sharp chest pain unclear, but likely MSK  in etiology.  Troponin and ECG were negative, ruling out ACS.  Her chest pain had resolved by the following morning.  She was discharged with Cardiology follow up for possible stress test.  Asthma: Patient's chest tightness resolved with albuterol nebulizer.  She was discharged with albuterol inhaler.  At follow up, please address patient's asthma control and need for increased therapy.  HFpEF: Echo revealed EF 65-70% and grade 1 diastolic dysfunction.  Patient's HTN managed with ARB-HCTZ.  Lipid panel was normal and ASCVD risk was 5.4%.  Patient was discharged on her home medications, with the addition of Atorvastatin 20 mg daily.  DMII: Patient's A1c on admission 8.4%.  She was recently switched to [place] insulin, with ongoing titration.  Patient does not currently have a PCP in the area for further management. She was scheduled in [facility] for further insulin/medication of her DMII.  Discharge Vitals:   BP 108/70 mmHg  Pulse 73  Temp(Src) 97.8 F (36.6 C) (Oral)  Resp 16  Ht 5' (1.524 m)  Wt 205 lb (92.987 kg)  BMI 40.04 kg/m2  SpO2 97%  Discharge Labs:  Results for orders placed or performed during the hospital encounter of 07/11/15 (from the past 24 hour(s))  Troponin I (q 6hr x 3)     Status:  None   Collection Time: 07/11/15 12:29 PM  Result Value Ref Range   Troponin I <0.03 <0.031 ng/mL  Hemoglobin A1c     Status: Abnormal   Collection Time: 07/11/15 12:29 PM  Result Value Ref Range   Hgb A1c MFr Bld 8.4 (H) 4.8 - 5.6 %   Mean Plasma Glucose 194 mg/dL  TSH     Status: None   Collection Time: 07/11/15 12:29 PM  Result Value Ref Range   TSH 1.962 0.350 - 4.500 uIU/mL  Glucose, capillary     Status: Abnormal   Collection Time: 07/11/15  4:24 PM  Result Value Ref Range   Glucose-Capillary 237 (H) 65 - 99 mg/dL   Comment 1 Notify RN    Comment 2 Document in Chart   Troponin I (q 6hr x 3)     Status: None   Collection Time: 07/11/15  5:12 PM  Result Value Ref Range   Troponin I <0.03 <0.031 ng/mL  Glucose, capillary     Status: Abnormal   Collection Time: 07/11/15  8:02 PM  Result Value Ref Range   Glucose-Capillary 221 (H) 65 - 99 mg/dL  Troponin I (q 6hr x 3)     Status: None   Collection Time: 07/12/15 12:00 AM  Result Value Ref Range   Troponin I <0.03 <0.031 ng/mL  Lipid panel     Status: None   Collection Time: 07/12/15  3:12 AM  Result Value Ref Range   Cholesterol 154 0 - 200 mg/dL   Triglycerides 119 <150 mg/dL   HDL 48 >40 mg/dL   Total CHOL/HDL Ratio 3.2 RATIO   VLDL 24 0 - 40 mg/dL   LDL Cholesterol 82 0 - 99 mg/dL  Glucose, capillary     Status: Abnormal   Collection Time: 07/12/15  6:00 AM  Result Value Ref Range   Glucose-Capillary 141 (H) 65 - 99 mg/dL    Signed: Iline Oven, MD 07/12/2015, 11:23 AM    Services Ordered on Discharge: none Equipment Ordered on Discharge: none

## 2015-07-12 NOTE — Telephone Encounter (Signed)
Pt needs TOC. Pt scheduled for 07/26/15 at 2:15 with dr Genene Churn

## 2015-07-14 NOTE — Telephone Encounter (Signed)
No answer

## 2015-07-17 NOTE — Telephone Encounter (Signed)
Mailbox full today when called

## 2015-07-19 NOTE — Telephone Encounter (Signed)
Mailbox is full.

## 2015-07-26 ENCOUNTER — Ambulatory Visit: Payer: BC Managed Care – PPO | Admitting: Internal Medicine

## 2015-07-27 ENCOUNTER — Encounter (INDEPENDENT_AMBULATORY_CARE_PROVIDER_SITE_OTHER): Payer: BC Managed Care – PPO

## 2015-07-27 DIAGNOSIS — R079 Chest pain, unspecified: Secondary | ICD-10-CM | POA: Diagnosis not present

## 2015-07-27 DIAGNOSIS — R9431 Abnormal electrocardiogram [ECG] [EKG]: Secondary | ICD-10-CM | POA: Diagnosis not present

## 2015-07-27 DIAGNOSIS — R0602 Shortness of breath: Secondary | ICD-10-CM | POA: Diagnosis not present

## 2015-07-27 LAB — EXERCISE TOLERANCE TEST
CHL CUP STRESS STAGE 1 DBP: 82 mmHg
CHL CUP STRESS STAGE 1 SPEED: 0 mph
CHL CUP STRESS STAGE 2 HR: 96 {beats}/min
CHL CUP STRESS STAGE 3 GRADE: 0.1 %
CHL CUP STRESS STAGE 3 HR: 95 {beats}/min
CHL CUP STRESS STAGE 4 DBP: 80 mmHg
CHL CUP STRESS STAGE 5 GRADE: 12 %
CHL CUP STRESS STAGE 7 GRADE: 0 %
CHL CUP STRESS STAGE 7 HR: 115 {beats}/min
CHL CUP STRESS STAGE 7 SBP: 210 mmHg
CHL CUP STRESS STAGE 7 SPEED: 0 mph
CHL CUP STRESS STAGE 8 DBP: 83 mmHg
CHL CUP STRESS STAGE 8 SBP: 128 mmHg
CHL RATE OF PERCEIVED EXERTION: 17
CSEPHR: 89 %
Estimated workload: 7 METS
Exercise duration (min): 6 min
Exercise duration (sec): 0 s
MPHR: 166 {beats}/min
Peak HR: 148 {beats}/min
Percent of predicted max HR: 89 %
Rest HR: 75 {beats}/min
Stage 1 Grade: 0 %
Stage 1 HR: 86 {beats}/min
Stage 1 SBP: 139 mmHg
Stage 2 Grade: 0 %
Stage 2 Speed: 1 mph
Stage 3 Speed: 1 mph
Stage 4 Grade: 10 %
Stage 4 HR: 126 {beats}/min
Stage 4 SBP: 169 mmHg
Stage 4 Speed: 1.7 mph
Stage 5 DBP: 96 mmHg
Stage 5 HR: 148 {beats}/min
Stage 5 SBP: 216 mmHg
Stage 5 Speed: 2.5 mph
Stage 6 Grade: 12 %
Stage 6 HR: 148 {beats}/min
Stage 6 Speed: 2.5 mph
Stage 7 DBP: 93 mmHg
Stage 8 Grade: 0 %
Stage 8 HR: 96 {beats}/min
Stage 8 Speed: 0 mph

## 2015-07-28 ENCOUNTER — Encounter: Payer: Self-pay | Admitting: *Deleted

## 2015-08-02 ENCOUNTER — Encounter: Payer: BC Managed Care – PPO | Admitting: Cardiovascular Disease

## 2015-08-08 ENCOUNTER — Telehealth: Payer: Self-pay | Admitting: General Practice

## 2015-08-08 NOTE — Telephone Encounter (Signed)
APPT. REMINDER CALL, NO ANSWER, VOICE MAIL FULL °

## 2015-08-10 ENCOUNTER — Ambulatory Visit: Payer: BC Managed Care – PPO | Admitting: Internal Medicine

## 2015-08-18 ENCOUNTER — Ambulatory Visit: Payer: BC Managed Care – PPO | Admitting: Internal Medicine

## 2015-08-18 ENCOUNTER — Encounter: Payer: BC Managed Care – PPO | Admitting: Cardiovascular Disease

## 2015-09-01 ENCOUNTER — Ambulatory Visit: Payer: BC Managed Care – PPO | Admitting: Internal Medicine

## 2016-04-16 ENCOUNTER — Emergency Department (HOSPITAL_COMMUNITY): Payer: BC Managed Care – PPO

## 2016-04-16 ENCOUNTER — Encounter (HOSPITAL_COMMUNITY): Payer: Self-pay | Admitting: Emergency Medicine

## 2016-04-16 ENCOUNTER — Emergency Department (HOSPITAL_COMMUNITY)
Admission: EM | Admit: 2016-04-16 | Discharge: 2016-04-16 | Disposition: A | Discharge status: Home / Self Care | Payer: BC Managed Care – PPO | Attending: Emergency Medicine | Admitting: Emergency Medicine

## 2016-04-16 DIAGNOSIS — Z7982 Long term (current) use of aspirin: Secondary | ICD-10-CM | POA: Diagnosis not present

## 2016-04-16 DIAGNOSIS — Z794 Long term (current) use of insulin: Secondary | ICD-10-CM | POA: Insufficient documentation

## 2016-04-16 DIAGNOSIS — R1031 Right lower quadrant pain: Secondary | ICD-10-CM | POA: Insufficient documentation

## 2016-04-16 DIAGNOSIS — E119 Type 2 diabetes mellitus without complications: Secondary | ICD-10-CM | POA: Insufficient documentation

## 2016-04-16 DIAGNOSIS — I10 Essential (primary) hypertension: Secondary | ICD-10-CM | POA: Insufficient documentation

## 2016-04-16 DIAGNOSIS — R109 Unspecified abdominal pain: Secondary | ICD-10-CM

## 2016-04-16 DIAGNOSIS — J45909 Unspecified asthma, uncomplicated: Secondary | ICD-10-CM | POA: Diagnosis not present

## 2016-04-16 LAB — COMPREHENSIVE METABOLIC PANEL
ALBUMIN: 4.1 g/dL (ref 3.5–5.0)
ALT: 19 U/L (ref 14–54)
AST: 24 U/L (ref 15–41)
Alkaline Phosphatase: 83 U/L (ref 38–126)
Anion gap: 10 (ref 5–15)
BUN: 15 mg/dL (ref 6–20)
CHLORIDE: 101 mmol/L (ref 101–111)
CO2: 28 mmol/L (ref 22–32)
Calcium: 9.1 mg/dL (ref 8.9–10.3)
Creatinine, Ser: 0.86 mg/dL (ref 0.44–1.00)
GFR calc Af Amer: >60 mL/min (ref >60)
GLUCOSE: 128 mg/dL — AB (ref 65–99)
POTASSIUM: 3 mmol/L — AB (ref 3.5–5.1)
SODIUM: 139 mmol/L (ref 135–145)
Total Bilirubin: 0.6 mg/dL (ref 0.3–1.2)
Total Protein: 7.1 g/dL (ref 6.5–8.1)

## 2016-04-16 LAB — URINALYSIS, ROUTINE W REFLEX MICROSCOPIC
BACTERIA UA: NONE SEEN
BILIRUBIN URINE: NEGATIVE
Glucose, UA: NEGATIVE mg/dL
Hgb urine dipstick: NEGATIVE
KETONES UR: NEGATIVE mg/dL
Nitrite: NEGATIVE
Protein, ur: NEGATIVE mg/dL
SPECIFIC GRAVITY, URINE: 1.026 (ref 1.005–1.030)
pH: 5 (ref 5.0–8.0)

## 2016-04-16 LAB — CBC
HEMATOCRIT: 39.5 % (ref 36.0–46.0)
Hemoglobin: 13.3 g/dL (ref 12.0–15.0)
MCH: 28.1 pg (ref 26.0–34.0)
MCHC: 33.7 g/dL (ref 30.0–36.0)
MCV: 83.3 fL (ref 78.0–100.0)
Platelets: 262 10*3/uL (ref 150–400)
RBC: 4.74 MIL/uL (ref 3.87–5.11)
RDW: 14.6 % (ref 11.5–15.5)
WBC: 11.1 10*3/uL — AB (ref 4.0–10.5)

## 2016-04-16 LAB — LIPASE, BLOOD: LIPASE: 31 U/L (ref 11–51)

## 2016-04-16 MED ORDER — OXYCODONE-ACETAMINOPHEN 5-325 MG PO TABS
1.0000 | ORAL_TABLET | ORAL | Status: DC | PRN
  Administered 2016-04-16: 1 via ORAL
  Filled 2016-04-16: qty 1

## 2016-04-16 MED ORDER — HYDROCODONE-ACETAMINOPHEN 5-325 MG PO TABS: 1.0000 | ORAL_TABLET | Freq: Four times a day (QID) | ORAL | 0 refills | Status: DC | PRN

## 2016-04-16 NOTE — ED Notes (Signed)
Pt ambulated to the restroom with a steady gait to obtain urine sample.

## 2016-04-16 NOTE — ED Notes (Signed)
Pt being sent by PCP.  C/o R flank pain starting today.  Pain score 9/10.  PCP reports UA was negative.

## 2016-04-16 NOTE — Discharge Instructions (Signed)
Ibuprofen 600 mg 3 times daily for the next several days.  Hydrocodone as prescribed as needed for pain not relieved with ibuprofen.  Return to the emergency department if you develop worsening pain, high fever, bloody stools, or other new and concerning symptoms.

## 2016-04-16 NOTE — ED Triage Notes (Signed)
Patient is complaining of right lower abdominal pain and right flank pain starting yesterday morning. Denies n/v/d.

## 2016-04-16 NOTE — ED Provider Notes (Signed)
[facility] DEPT Provider Note   CSN: [id number] Arrival date & time: 04/16/16  1538     History   Chief Complaint Chief Complaint  Patient presents with  . Flank Pain  . Abdominal Pain    HPI Shelley Hendricks is a 55 y.o. female.  Patient is a 55 year old female with history of hypertension, diabetes. She presents for evaluation of right lower quadrant pain that started yesterday morning. This started suddenly and is worsening. Her pain is constant. She denies any fevers or chills. She denies any bowel or bladder complaints. She reports being menopausal for several years.   The history is provided by the patient.  Flank Pain  This is a new problem. The current episode started yesterday. The problem occurs constantly. The problem has been gradually worsening. Exacerbated by: Movement and palpation. Nothing relieves the symptoms. She has tried nothing for the symptoms.    Past Medical History:  Diagnosis Date  . Asthma   . Diabetes mellitus without complication ([place])    INSULIN DEPENDENT  . Hypertension   . Shortness of breath dyspnea     Patient Active Problem List   Diagnosis Date Noted  . Chest pain of uncertain etiology   . Type 2 diabetes mellitus with hyperglycemia, with long-term current use of insulin ([place])   . Chest pain 07/11/2015  . Asthma 07/11/2015  . Type 2 diabetes mellitus ([place]) 07/11/2015  . Essential hypertension 07/11/2015    Past Surgical History:  Procedure Laterality Date  . LIPOSUCTION    . MYOMECTOMY      OB History    No data available       Home Medications    Prior to Admission medications   Medication Sig Start Date End Date Taking? Authorizing Provider  albuterol (PROVENTIL HFA;VENTOLIN HFA) 108 (90 Base) MCG/ACT inhaler Inhale 2 puffs into the lungs every 6 (six) hours as needed for wheezing or shortness of breath. 07/12/15   Iline Oven, MD  aspirin EC 81 MG EC tablet Take 1 tablet (81 mg total) by mouth daily. 07/12/15    Iline Oven, MD  atorvastatin (LIPITOR) 20 MG tablet Take 1 tablet (20 mg total) by mouth daily. 07/12/15   Iline Oven, MD  BLACK COHOSH PO Take 1 tablet by mouth daily.    Historical Provider, MD  cholecalciferol (VITAMIN D) 1000 units tablet Take 1,000 Units by mouth daily.    Historical Provider, MD  Evening Primrose Oil 500 MG CAPS Take 500 mg by mouth daily.    Historical Provider, MD  Insulin Degludec (TRESIBA FLEXTOUCH) 200 UNIT/ML SOPN Inject 30 Units into the skin daily.    Historical Provider, MD  metFORMIN (GLUCOPHAGE-XR) 500 MG 24 hr tablet Take 500 mg by mouth 2 (two) times daily.    Historical Provider, MD  olmesartan-hydrochlorothiazide (BENICAR HCT) 40-25 MG tablet Take 1 tablet by mouth daily.    Historical Provider, MD  pyridOXINE (VITAMIN B-6) 100 MG tablet Take 100 mg by mouth daily.    Historical Provider, MD  saccharomyces boulardii (FLORASTOR) 250 MG capsule Take 250 mg by mouth 2 (two) times daily.    Historical Provider, MD  vitamin C (ASCORBIC ACID) 250 MG tablet Take 250 mg by mouth daily.    Historical Provider, MD    Family History Family History  Problem Relation Age of Onset  . Hypertension Mother   . Diabetes Mother   . Hypertension Father     Social History Social History  Substance Use  Topics  . Smoking status: Never Smoker  . Smokeless tobacco: Never Used  . Alcohol use No     Allergies   Penicillins   Review of Systems Review of Systems  Genitourinary: Positive for flank pain.  All other systems reviewed and are negative.    Physical Exam Updated Vital Signs BP 142/79 (BP Location: Left Arm)   Pulse 64   Temp 98.3 F (36.8 C) (Oral)   Resp 18   Ht 5' (1.524 m)   Wt 197 lb (89.4 kg)   SpO2 99%   BMI 38.47 kg/m   Physical Exam  Constitutional: She is oriented to person, place, and time. She appears well-developed and well-nourished. No distress.  HENT:  Head: Normocephalic and atraumatic.  Neck: Normal range of  motion. Neck supple.  Cardiovascular: Normal rate and regular rhythm.  Exam reveals no gallop and no friction rub.   No murmur heard. Pulmonary/Chest: Effort normal and breath sounds normal. No respiratory distress. She has no wheezes.  Abdominal: Soft. Bowel sounds are normal. She exhibits no distension. There is tenderness. There is no rebound and no guarding.  There is tenderness to palpation in the right lower quadrant and right lateral abdomen.  Musculoskeletal: Normal range of motion.  Neurological: She is alert and oriented to person, place, and time.  Skin: Skin is warm and dry. She is not diaphoretic.  Nursing note and vitals reviewed.    ED Treatments / Results  Labs (all labs ordered are listed, but only abnormal results are displayed) Labs Reviewed  CBC - Abnormal; Notable for the following:       Result Value   WBC 11.1 (*)    All other components within normal limits  LIPASE, BLOOD  COMPREHENSIVE METABOLIC PANEL  URINALYSIS, ROUTINE W REFLEX MICROSCOPIC    EKG  EKG Interpretation None       Radiology No results found.  Procedures Procedures (including critical care time)  Medications Ordered in ED Medications  oxyCODONE-acetaminophen (PERCOCET/ROXICET) 5-325 MG per tablet 1 tablet (1 tablet Oral Given 04/16/16 1628)     Initial Impression / Assessment and Plan / ED Course  I have reviewed the triage vital signs and the nursing notes.  Pertinent labs & imaging results that were available during my care of the patient were reviewed by me and considered in my medical decision making (see chart for details).  Clinical Course     Patient sent here from urgent care for further evaluation of right lower quadrant and flank pain. She was given a Percocet in triage and is now feeling somewhat better. Her workup reveals a slight leukocytosis, however there is no evidence for appendicitis, renal calculus, or other intra-abdominal pathology in her CAT scan. There  is also no evidence for infection in the urine. I am uncertain as to what is causing her pain, however nothing appears emergent at this time. She will be treated with pain medication, rest, and as needed return.  Final Clinical Impressions(s) / ED Diagnoses   Final diagnoses:  Right flank pain    New Prescriptions New Prescriptions   No medications on file     Veryl Speak, MD 04/16/16 1819

## 2016-04-16 NOTE — ED Notes (Signed)
Patient made aware medication can make her drowsy and informed patient it is not advised to drive after medication intake. Patient states she will have someone pick her up for the hospital when she leaves.

## 2016-07-15 ENCOUNTER — Encounter: Payer: BC Managed Care – PPO | Admitting: Obstetrics and Gynecology

## 2016-12-09 ENCOUNTER — Other Ambulatory Visit: Payer: Self-pay | Admitting: Family Medicine

## 2016-12-09 DIAGNOSIS — Z1231 Encounter for screening mammogram for malignant neoplasm of breast: Secondary | ICD-10-CM

## 2016-12-16 ENCOUNTER — Ambulatory Visit: Payer: BC Managed Care – PPO

## 2016-12-19 ENCOUNTER — Ambulatory Visit: Payer: BC Managed Care – PPO

## 2017-02-10 ENCOUNTER — Ambulatory Visit (HOSPITAL_BASED_OUTPATIENT_CLINIC_OR_DEPARTMENT_OTHER): Payer: BC Managed Care – PPO

## 2017-02-10 ENCOUNTER — Other Ambulatory Visit: Payer: Self-pay | Admitting: Family

## 2017-02-10 ENCOUNTER — Ambulatory Visit (HOSPITAL_BASED_OUTPATIENT_CLINIC_OR_DEPARTMENT_OTHER): Payer: BC Managed Care – PPO | Admitting: Family

## 2017-02-10 VITALS — BP 121/78 | HR 110 | Temp 98.2°F | Resp 16 | Wt 193.0 lb

## 2017-02-10 DIAGNOSIS — R799 Abnormal finding of blood chemistry, unspecified: Secondary | ICD-10-CM

## 2017-02-10 DIAGNOSIS — M79605 Pain in left leg: Secondary | ICD-10-CM

## 2017-02-10 DIAGNOSIS — I10 Essential (primary) hypertension: Secondary | ICD-10-CM

## 2017-02-10 DIAGNOSIS — R7989 Other specified abnormal findings of blood chemistry: Secondary | ICD-10-CM

## 2017-02-10 DIAGNOSIS — M79604 Pain in right leg: Secondary | ICD-10-CM

## 2017-02-10 DIAGNOSIS — R509 Fever, unspecified: Secondary | ICD-10-CM | POA: Diagnosis not present

## 2017-02-10 DIAGNOSIS — E119 Type 2 diabetes mellitus without complications: Secondary | ICD-10-CM

## 2017-02-10 DIAGNOSIS — C9590 Leukemia, unspecified not having achieved remission: Secondary | ICD-10-CM

## 2017-02-10 DIAGNOSIS — R0602 Shortness of breath: Secondary | ICD-10-CM | POA: Diagnosis not present

## 2017-02-10 LAB — CMP (CANCER CENTER ONLY)
ALK PHOS: 78 U/L (ref 26–84)
ALT: 24 U/L (ref 10–47)
AST: 30 U/L (ref 11–38)
Albumin: 3.6 g/dL (ref 3.3–5.5)
BILIRUBIN TOTAL: 1.5 mg/dL (ref 0.20–1.60)
BUN: 14 mg/dL (ref 7–22)
CALCIUM: 10.1 mg/dL (ref 8.0–10.3)
CO2: 29 mEq/L (ref 18–33)
CREATININE: 1.2 mg/dL (ref 0.6–1.2)
Chloride: 98 mEq/L (ref 98–108)
GLUCOSE: 175 mg/dL — AB (ref 73–118)
Potassium: 3.3 mEq/L (ref 3.3–4.7)
Sodium: 137 mEq/L (ref 128–145)
Total Protein: 7.4 g/dL (ref 6.4–8.1)

## 2017-02-10 LAB — CHCC SATELLITE - SMEAR

## 2017-02-10 LAB — MANUAL DIFFERENTIAL (CHCC SATELLITE)
ALC: 2.9 10*3/uL — AB (ref 0.6–2.2)
ANC (CHCC HP manual diff): 3.1 10*3/uL (ref 1.5–6.7)
BAND NEUTROPHILS: 5 % (ref 0–10)
BLASTS: 12 % — AB (ref 0–0)
LYMPH: 41 % (ref 14–48)
MONO: 2 % (ref 0–13)
Metamyelocytes: 4 % — ABNORMAL HIGH (ref 0–0)
Myelocytes: 2 % — ABNORMAL HIGH (ref 0–0)
PLT EST ~~LOC~~: ADEQUATE
PROMYELO: 1 % — ABNORMAL HIGH (ref 0–0)
SEG: 33 % — AB (ref 40–75)
nRBC: 1 % — ABNORMAL HIGH (ref 0–0)

## 2017-02-10 LAB — CBC WITH DIFFERENTIAL (CANCER CENTER ONLY)
HEMATOCRIT: 29.4 % — AB (ref 34.8–46.6)
HEMOGLOBIN: 10 g/dL — AB (ref 11.6–15.9)
MCH: 27.5 pg (ref 26.0–34.0)
MCHC: 34 g/dL (ref 32.0–36.0)
MCV: 81 fL (ref 81–101)
Platelets: 149 10*3/uL (ref 145–400)
RBC: 3.64 10*6/uL — ABNORMAL LOW (ref 3.70–5.32)
RDW: 16.1 % — AB (ref 11.1–15.7)
WBC: 7.1 10*3/uL (ref 3.9–10.0)

## 2017-02-10 MED ORDER — INDOMETHACIN ER 75 MG PO CPCR: 75.0000 mg | ORAL_CAPSULE | Freq: Two times a day (BID) | ORAL | 1 refills | Status: AC | PRN

## 2017-02-10 MED ORDER — TRAMADOL HCL 50 MG PO TABS: 50.0000 mg | ORAL_TABLET | Freq: Four times a day (QID) | ORAL | 1 refills | Status: AC | PRN

## 2017-02-10 NOTE — Progress Notes (Signed)
Hematology/Oncology Consultation   Name: Shelley Hendricks      MRN: [id number]    Location: Room/bed info not found  Date: 02/10/2017 Time:2:36 PM   REFERRING PHYSICIAN: Rochel Brome, MD  REASON FOR CONSULT: Abnormal blood smear   DIAGNOSIS: Blasts present on smear, working up for possible leukemia   HISTORY OF PRESENT ILLNESS: Shelley Hendricks is a very pleasant 56 yo African American female who was recently found to have blasts on her blood smear while at a visit with her PCP.  She is symptomatic with fatigue, weakness, SOB with any exertion, dizziness, bloated abdomen, recurring fever (up to 101) with chills, chest tightness, tingling in her her extremities and muscle/nerve pain and cramping.  She takes advil several times a day to help with the pain and fever.  Hgb is 10.0 with an MCV of 81. Platelets are 149, WBC count 7.1.  She denies n/v, cough, rash, chest pain, palpitations, abdominal pain or changes in bowel or bladder habits. She hs occasional constipation.  No past history of cancer. Her mother had breast cancer with bilateral mastectomy.  She had a biopsy of the right breast several years ago which was negative and states that she has not had a mammogram since. I advised that she follow-up with her PCP and schedule her annual exam.  Her appetite comes and goes. She is staying well hydrated. Her weight is stable.  She has history of 2 miscarriages and 1 abortion.  She is diabetic on both Janumet and Tresiba. Her blood sugars are not well controlled.  No sickle cell disease or trait.  Both her mother and sister have history of iron deficiency anemia.  She is not a smoker and does not drink alcohol.  She moved to [place] a year and a half ago from [place] for work. 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   PAST MEDICAL HISTORY:   Past Medical History:  Diagnosis  Date  . Asthma   . Diabetes mellitus without complication ([place])    INSULIN DEPENDENT  . Hypertension   . Shortness of breath dyspnea     ALLERGIES: Allergies  Allergen Reactions  . Penicillins Hives    Has patient had a PCN reaction causing immediate rash, facial/tongue/throat swelling, SOB or lightheadedness with hypotension: Yes Has patient had a PCN reaction causing severe rash involving mucus membranes or skin necrosis: No Has patient had a PCN reaction that required hospitalization No Has patient had a PCN reaction occurring within the last 10 years: No If all of the above answers are "NO", then may proceed with Cephalosporin use.       MEDICATIONS:  Current Outpatient Prescriptions on File Prior to Visit  Medication Sig Dispense Refill  . albuterol (PROVENTIL HFA;VENTOLIN HFA) 108 (90 Base) MCG/ACT inhaler Inhale 2 puffs into the lungs every 6 (six) hours as needed for wheezing or shortness of breath. 1 Inhaler 2  . aspirin EC 81 MG EC tablet Take 1 tablet (81 mg total) by mouth daily. 30 tablet 3  . atorvastatin (LIPITOR) 20 MG tablet Take 1 tablet (20 mg total) by mouth daily. 30 tablet 3  . BLACK COHOSH PO Take 1 tablet by mouth daily.    . cholecalciferol (VITAMIN D) 1000 units tablet Take 1,000 Units by mouth daily.    . Evening Primrose Oil 500 MG CAPS Take 500 mg by  mouth daily.    Marland Kitchen HYDROcodone-acetaminophen (NORCO) 5-325 MG tablet Take 1-2 tablets by mouth every 6 (six) hours as needed. 12 tablet 0  . Insulin Degludec (TRESIBA FLEXTOUCH) 200 UNIT/ML SOPN Inject 30 Units into the skin daily.    . metFORMIN (GLUCOPHAGE-XR) 500 MG 24 hr tablet Take 500 mg by mouth 2 (two) times daily.    Marland Kitchen olmesartan-hydrochlorothiazide (BENICAR HCT) 40-25 MG tablet Take 1 tablet by mouth daily.    Marland Kitchen pyridOXINE (VITAMIN B-6) 100 MG tablet Take 100 mg by mouth daily.    Marland Kitchen saccharomyces boulardii (FLORASTOR) 250 MG capsule Take 250 mg by mouth 2 (two) times daily.    . vitamin C (ASCORBIC  ACID) 250 MG tablet Take 250 mg by mouth daily.     No current facility-administered medications on file prior to visit.      PAST SURGICAL HISTORY Past Surgical History:  Procedure Laterality Date  . LIPOSUCTION    . MYOMECTOMY      FAMILY HISTORY: Family History  Problem Relation Age of Onset  . Hypertension Mother   . Diabetes Mother   . Hypertension Father     SOCIAL HISTORY:  reports that she has never smoked. She has never used smokeless tobacco. She reports that she does not drink alcohol or use drugs.  PERFORMANCE STATUS: The patient's performance status is 1 - Symptomatic but completely ambulatory  PHYSICAL EXAM: Most Recent Vital Signs: There were no vitals taken for this visit. There were no vitals taken for this visit.  General Appearance:    Alert, cooperative, no distress, appears stated age  Head:    Normocephalic, without obvious abnormality, atraumatic  Eyes:    PERRL, conjunctiva/corneas clear, EOM's intact, fundi    benign, both eyes        Throat:   Lips, mucosa, and tongue normal; teeth and gums normal  Neck:   Supple, symmetrical, trachea midline, no adenopathy;    thyroid:  no enlargement/tenderness/nodules; no carotid   bruit or JVD  Back:     Symmetric, no curvature, ROM normal, no CVA tenderness  Lungs:     Clear to auscultation bilaterally, respirations unlabored  Chest Wall:    No tenderness or deformity   Heart:    Regular rate and rhythm, S1 and S2 normal, no murmur, rub   or gallop     Abdomen:     Soft, non-tender, bowel sounds active all four quadrants,    no masses, no organomegaly        Extremities:   Extremities normal, atraumatic, no cyanosis or edema  Pulses:   2+ and symmetric all extremities  Skin:   Skin color, texture, turgor normal, no rashes or lesions  Lymph nodes:   Cervical, supraclavicular, and axillary nodes normal  Neurologic:   CNII-XII intact, normal strength, sensation and reflexes    throughout     LABORATORY DATA:  No results found for this or any previous visit (from the past 48 hour(s)).    RADIOGRAPHY: No results found.     PATHOLOGY: None   ASSESSMENT/PLAN: Shelley Hendricks is a very pleasant 56 yo [place] American female who was recently found to have blasts indicative of an acute leukemia. She is symptomatic as mentioned above.  We will get her set up for a bone marrow biopsy next week.  If this in fact an acute leukemia she would like to go back home to [place] and be seen at [facility].  She was given prescriptions today  for Ultram for pain as well as Indocin. She understands that she is to take the Indocin with food.  We will scheduled her follow-up once we have her biopsy report. She is in agreement with the plan  All questions were answered and she verbalized understanding. She will contact our office with any other questions or concerns. We can certainly see her sooner if needed.  The patient was discussed with and also seen by Dr. Marin Olp and he is in agreement with the aforementioned.   [facility]    ADDENDUM:  I saw and examined the patient with Shelley Hendricks. I agree with the above assessment. 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 I looked at her blood smear. She had blast. They look like myeloblasts. I do not see any Auer rods.  She really looks good. She has no leukopenia or thrombocytopenia.  I think that a bone marrow test is mandatory. This is only way that we will be able to tell what we are dealing with.  I cannot imagine that this is chronic myeloid leukemia. I suppose that ALL could always be considered.  We spent about 45 minutes with her. We gave her a prayer blanket.  She lives by herself down here. Her family is up in [place]. If we do find that she has acute leukemia, then we will see about referring her up to [facility].  She understands what might be going on. She is very intelligent. She is  very nice. I must say that she has a very strong faith.  We will try to get the bone marrow biopsy this week. The cytogenetics will be critical.  Lattie Haw, MD

## 2017-02-10 NOTE — Patient Instructions (Signed)
Chronic Lymphocytic Leukemia Chronic lymphocytic leukemia (CLL) is a type of cancer of the blood cells and soft tissue inside bones (bone marrow). CLL happens when your bone marrow makes too many abnormal white blood cells. The cells, called leukemia cells, do not function normally and accumulate in the blood. Eventually they crowd out other healthy blood cells. CLL usually gets worse slowly. It can cause complications in your organs, such as in your spleen. It can also weaken your immune system and lead to conditions in which your immune system attacks your body (autoimmune conditions). What are the causes? The cause of this condition is not known. What increases the risk? You are more likely to develop this condition if:  You are older than 50 years.  You are white.  You are female.  You have a family history of CLL or other cancers of the lymph system.  You are of [place] or [place] descent.  You have been exposed to certain chemicals, such as: ? Insecticides. ? Herbicides. These include Agent Orange, a herbicide used in the [place] war.  What are the signs or symptoms? At first, there may be no symptoms. After a while, symptoms may include:  Feeling more tired than usual, even after rest.  Unplanned weight loss.  Heavy sweating at night.  Fever.  Shortness of breath.  Paleness.  Painless, swollen lymph nodes.  A feeling of fullness in the upper left part of the abdomen.  Easy bruising or bleeding.  Frequent infections.  How is this diagnosed? This condition is diagnosed based on:  A physical exam to check for an enlarged spleen, liver, or lymph nodes.  Blood and bone marrow tests to check for leukemia cells. Tests may include: ? A complete blood count. ? Flow cytometry. This method uses light sensors and dyes to figure out the number of cells as well as their size, structure, and general health. ? Immunophenotyping. This method is used to  diagnose leukemia by identifying specific antibodies found in white blood cells. The test is used when a complete blood count shows the presence of immature cells or a high number of white blood cells. ? Fluorescence in situ hybridization (FISH). This test is used to examine defects in chromosomes and how those defects affect the functioning of the cell. Results from a [place] test will be used to determine treatment and assess the outcome of that treatment.  A CT scan to check for swelling or anything abnormal in your spleen, liver, and lymph nodes.  How is this treated? Treatment for this condition depends on the stage of the leukemia and whether you have symptoms. Treatment may include:  Observation.  Targeted drugs. These are medicines that interfere with the way leukemia cells grow and multiply. They identify and attack specific leukemia cells without harming normal cells.  Chemotherapy drugs. These are medicines that kill leukemia cells that are multiplying quickly.  Radiation.  Surgery to remove the spleen.  Biological therapy (immunotherapy). This treatment boosts the ability of your immune system to fight the leukemia cells.  Bone marrow or peripheral blood stem cell transplant. This treatment replaces your own bone marrow or stem cells with bone marrow or stem cells from a donor. This treatment may be done after you receive very high doses of chemotherapy or radiation that kill your stem cells and bone marrow.  New treatments through clinical trials.  Additional medicines may be needed to help manage symptoms. Follow these instructions at home: Medicines  Take  over-the-counter and prescription medicines only as told by your health care provider.  If you were prescribed an antibiotic medicine, take it as told by your health care provider. Do not stop taking the antibiotic even if you start to feel better. If you are on chemotherapy:  Wash your hands often, especially before  meals, after being outside, and after using the toilet. Have visitors do the same.  Keep your teeth and gums clean and well cared for. Use soft toothbrushes.  Protect your skin from the sun by using sunscreen and wearing protective clothing. General instructions  Avoid contact sports or other rough activities. Ask your health care provider what activities are safe for you.  Avoid crowded places and people who are sick.  Tell your cancer care team if you develop side effects. They may be able to recommend ways to relieve them.  Try to eat regular, healthy meals. Some of your treatments might affect your appetite.  Find healthy ways of coping with stress, such as by doing yoga or meditation or by joining a support group.  Keep all follow-up visits as told by your health care provider. This is important. Where to find more information:  American Cancer Society: www.cancer.org  Leukemia and Lymphoma Society: PreviewPal.pl  National Cancer Institute ([place]): www.cancer.gov Contact a health care provider if:  You have pain in your abdomen.  You develop new bruises that are getting bigger.  You have painful or more swollen lymph nodes.  You develop bleeding from your gums or nose.  You cannot eat or drink without vomiting.  You feel lightheaded. Get help right away if:  You have a fever or chills.  You develop chest pain.  You have trouble breathing or feel short of breath.  You faint.  There is blood in your urine or stool.  You have excessive bleeding.  You have any symptoms that are severe or uncontrolled. Summary  Chronic lymphocytic leukemia (CLL) is a type of cancer of the blood cells and bone marrow.  This condition can cause an enlarged spleen, swollen lymph nodes, a weakened immune system, low red blood cell and platelets counts, and autoimmune conditions.  Treatment for this condition depends on the stage of the cancer and whether you have  symptoms.  Chemotherapy, radiation, surgery to remove the spleen, and bone marrow transplant are some of the ways to treat CLL. This information is not intended to replace advice given to you by your health care provider. Make sure you discuss any questions you have with your health care provider. Document Released: 09/15/2008 Document Revised: 04/10/2016 Document Reviewed: 04/10/2016 Elsevier Interactive Patient Education  2017 [employer] Chronic Myelogenous Leukemia Chronic myelogenous leukemia (CML) is a rare form of cancer of the blood cells. The term "chronic" means that the leukemia develops more slowly than other kinds of leukemia. CML results from an abnormal chromosome called the [place] chromosome. The chromosome causes the body to make too many abnormal white blood cells, which keeps the body from making enough healthy blood cells such as red blood cells, white blood cells, and platelets. There are three phases of this condition:  Chronic phase. During this phase, leukemia cells grow slowly. There are minimal symptoms during this phase.  Accelerated phase. During this phase, there is an increase in the percentage of the leukemia cells in the bone marrow.  Blast phase. During this phase, there are many leukemia cells in the bone marrow. The leukemia cells are most aggressive and difficult to control during  this phase.  What are the causes? The cause of this condition is not known. What increases the risk? You are more likely to develop this condition if:  You are 75 years and older.  You are female.  You have had radiation treatment before.  You have the [place] chromosome.  What are the signs or symptoms? Symptoms of this condition include:  Feeling more tired than usual.  Pain in the joints.  Unplanned weight loss.  Feeling tired even after resting.  Heavy sweating at night.  Fever.  A feeling of fullness in the upper left part of the  abdomen.  Paleness.  Easy bruising or bleeding.  Frequent infections.  It takes a while for symptoms to occur. There may be no symptoms in the early stages of this condition. How is this diagnosed? This condition may be diagnosed with:  A physical exam. This will check for an enlarged spleen, liver, or lymph nodes.  Complete blood count with differential. This test measures the characteristics of white blood cells, red blood cells, and platelets.  Bone marrow tests.  Imaging test. A CT scan is used to check for swelling or anything abnormal in your spleen, liver, and lymph nodes.  Tests to screen for the [place] chromosome and other genetic abnormalities. These may include: ? Cytogenetic analysis. This is a test that is used to show the number and the appearance of chromosomes in the cell. Any damage to the chromosome can be discovered by this test. ? Fluorescence in situ hybridization. This test is used to examine defects in chromosomes and how those defects affect the functioning of the cell. Results from a [place] test will be used to determine treatment and assess the outcome of that treatment. ? The reverse transcription polymerase chain reaction test. This test is used to detect problems in the chromosome, including the presence of the [place] chromosome in the cell.  How is this treated? Treatment for this condition depends on the phase of the disease. Treatment may include:  Targeted medicines to treat specific gene mutations. These are medicines that work to stop the leukemia cells from growing and multiplying. They identify and attack specific leukemia cells without harming normal cells.  Chemotherapy. This treatment uses medicines to kill leukemia cells.  Biological therapy (immunotherapy). This treatment boosts the ability of your immune system to fight the leukemia cells.  Bone marrow or peripheral blood stem cell transplant. This treatment replaces your own bone  marrow or stem cells with bone marrow or stem cells from a donor. This treatment may be done if you received very high doses of chemotherapy or radiation that killed your stem cells and bone marrow.  Surgery to remove the spleen.  New treatments through clinical trials.  Additional medicines may be needed to help manage symptoms.  Additional medicines may be needed to help manage symptoms. Follow these instructions at home: Medicines  Take over-the-counter and prescription medicines only as told by your health care provider.  If you were prescribed an antibiotic medicine, take it as told by your health care provider. Do not stop taking the antibiotic even if you start to feel better. If you are on chemotherapy:  Wash your hands often, especially before meals, after being outside, and after using the toilet. Have visitors do the same.  Keep your teeth and gums clean and well cared for. Use soft toothbrushes.  Protect your skin from the sun by using sunscreen and wearing protective clothing.  Make sure that your family  members get a flu shot (influenza vaccine) every year. General instructions  Avoid contact sports or other rough activities. Ask your health care provider what activities are safe for you.  Avoid crowded places and people who are sick.  Tell your cancer care team if you develop side effects. They may be able to recommend ways to relieve them.  Try to eat regular, healthy meals. Some of your treatments might affect your appetite.  Find healthy ways of coping with stress, such as by doing yoga or meditation or by joining a support group.  Keep all follow-up visits as told by your health care provider. This is important. Where to find more information:  American Cancer Society: www.cancer.org  Leukemia and Lymphoma Society: PreviewPal.pl  National Cancer Institute ([place]): www.cancer.gov Contact a health care provider if:  You feel lightheaded.  You develop  bleeding from your gums or nose.  You cannot eat or drink without vomiting. Get help right away if:  You have a fever or chills.  You develop chest pain.  You have trouble breathing, or you feel short of breath.  You faint.  There is blood in your urine or stool.  You have excessive bleeding.  Have any severe or uncontrolled symptoms. Summary  CML is a chronic leukemia that produces abnormal white blood cells in the body.  This condition results from an abnormal chromosome called the [place] chromosome. The chromosome causes the body to make too many abnormal white blood cells, which keeps healthy blood cells from developing and working well in the body.  Symptoms are a result of a decreased number of normal blood cells and an enlarged spleen. Treatment is based on the phase of the disease. This information is not intended to replace advice given to you by your health care provider. Make sure you discuss any questions you have with your health care provider. Document Released: 04/11/2008 Document Revised: 04/10/2016 Document Reviewed: 04/10/2016 Elsevier Interactive Patient Education  2017 [employer] Inc. Acute Myelogenous Leukemia, Adult Acute myelogenous leukemia (AML) is a cancer of the blood and the soft tissue inside the bones (bone marrow). AML happens when your bone marrow makes abnormal myeloid cells, which are immature blood cells that go on to develop into blood cells that help fight infection, carry oxygen, and stop bleeding. The abnormal myeloid cells, called leukemia cells, grow rapidly and take up space in the blood, displacing healthy blood cells in the process. This cancer can grow quickly. There are several types of AML. The types vary depending on the characteristics of the leukemia cells and the genetic mutations of the cells. Other types of AML vary depending on whether the leukemia is related to a therapy, blood disorder, or Down syndrome. What are the  causes? The exact cause of this condition is not known. What increases the risk? You are more likely to develop this condition if:  You are 26 years and older.  You are female.  You smoke.  You have had chemotherapy or radiation therapy.  You have been exposed to certain chemicals, such as benzene.  You have other blood disorders.  You have a genetic disorder, such as Down syndrome.  What are the signs or symptoms? Symptoms of this condition include:  Poor appetite.  Tiring easily.  Weakness.  Shortness of breath.  Repeat infections.  Fever.  Nosebleeds and easy bleeding from minor cuts.  Slow healing of cuts.  Bruising.  Bone pain or aches.  Joint pain or aches.  Abdominal pain.  Pale skin.  Spots on the skin.  Weight loss.  Swollen gums.  How is this diagnosed? This condition is diagnosed with tests, such as:  Blood tests to check blood cell counts and the shape of the blood cells.  Genetic tests to help determine the best way to treat the disease.  Bone marrow aspiration test to check for leukemia cells.  Spinal fluid tests to check for leukemia cells.  Imaging tests, such as an X-ray, an ultrasound, or a CT scans.  How is this treated? Treatment for this condition depends on the type of AML. Treatment can last for several months, and for up to 2-3 years. Treatment aims to destroy leukemia cells as well as stop new diseased cells from growing. Treatment may include:  Chemotherapy. This is the most common treatment.  Targeted medicines to treat specific gene mutations. These are medicines that work to stop the leukemia cells from growing and multiplying. They identify and attack specific leukemia cells without harming normal cells.  High dose chemotherapy followed by a stem cell transplant to replace diseased bone marrow with healthy donor bone marrow.  Radiation therapy to kill cancer cells.  New treatments through clinical  trials.  Additional medicines may be needed to help manage symptoms. Follow these instructions at home: Medicines  Take over-the-counter and prescription medicines only as told by your health care provider.  If you were prescribed an antibiotic medicine, take it as told by your health care provider. Do not stop taking the antibiotic even if you start to feel better. If you are on chemotherapy:  Wash your hands often, especially before meals, after being outside, and after using the toilet. Have visitors do the same.  Keep your teeth and gums clean and well cared for. Use soft toothbrushes.  Protect your skin from the sun by using sunscreen and wearing protective clothing.  Make sure that your family members get a flu shot (influenza vaccine) every year. General instructions  Avoid contact sports or other rough activities. Ask your health care provider what activities are safe for you.  Avoid crowded places and people who are sick.  Tell your cancer care team if you develop side effects. They may be able to recommend ways to relieve them.  Try to eat regular, healthy meals. Some of your treatments might affect your appetite.  Find healthy ways of coping with stress, such as by doing yoga or meditation or by joining a support group.  Keep all follow-up visits as told by your health care provider. This is important. Where to find more information:  American Cancer Society: www.cancer.org  Leukemia and Lymphoma Society: PreviewPal.pl  National Cancer Institute ([place]): www.cancer.gov Contact a health care provider if:  You have a cough or cold symptoms.  You have a sore throat.  You have painful urination.  You have frequent diarrhea.  You cannot eat or drink without vomiting.  You have a skin rash.  You have been exposed to chickenpox or measles, especially if you are not immune to these illnesses. Get help right away if:  You have a fever or chills.  You have  trouble breathing.  You have blood in your urine or stool.  Your vision gets blurry.  You feel confused. Summary  AML is cancer of the blood and bone marrow. This cancer can grow quickly.  This condition is more likely to develop in older adults and in people who have other blood disorders, people who have received chemotherapy or radiation, people  who have had exposure to certain chemicals, or those who have Down syndrome.  This condition is usually treated with chemotherapy. Treatment may continue for years.  Some people with this condition require a stem cell transplant.  It is important to know your symptoms, and especially the symptoms that should prompt you to call your health care provider or to go to the emergency room. This information is not intended to replace advice given to you by your health care provider. Make sure you discuss any questions you have with your health care provider. Document Released: 02/17/2013 Document Revised: 04/10/2016 Document Reviewed: 04/10/2016 Elsevier Interactive Patient Education  2017 [employer].

## 2017-02-11 ENCOUNTER — Other Ambulatory Visit: Payer: Self-pay | Admitting: Radiology

## 2017-02-11 LAB — LACTATE DEHYDROGENASE: LDH: 474 U/L — ABNORMAL HIGH (ref 125–245)

## 2017-02-11 NOTE — Addendum Note (Signed)
Addended by: Burney Gauze R on: 02/11/2017 01:09 PM   Modules accepted: Level of Service

## 2017-02-12 ENCOUNTER — Encounter: Payer: Self-pay | Admitting: Hematology & Oncology

## 2017-02-12 ENCOUNTER — Other Ambulatory Visit: Payer: Self-pay | Admitting: Radiology

## 2017-02-12 NOTE — Progress Notes (Unsigned)
Faxed medical records to: [facility] P: [phone]     COPY SCANNED

## 2017-02-13 ENCOUNTER — Ambulatory Visit (HOSPITAL_COMMUNITY): Payer: BC Managed Care – PPO

## 2017-02-13 ENCOUNTER — Ambulatory Visit (HOSPITAL_COMMUNITY): Admission: RE | Admit: 2017-02-13 | Payer: BC Managed Care – PPO | Source: Ambulatory Visit

## 2017-02-15 LAB — APTT: aPTT: 32 s (ref 24–33)

## 2017-02-15 LAB — PROTHROMBIN TIME (PT)
INR: 1.1 (ref 0.8–1.2)
PROTHROMBIN TIME: 11.2 s (ref 9.1–12.0)

## 2017-02-20 ENCOUNTER — Encounter: Payer: Self-pay | Admitting: *Deleted

## 2017-02-24 ENCOUNTER — Encounter: Payer: Self-pay | Admitting: Hematology & Oncology

## 2017-04-01 ENCOUNTER — Telehealth: Payer: Self-pay | Admitting: Hematology & Oncology

## 2017-04-01 NOTE — Telephone Encounter (Signed)
Faxed medical records to: [facility]: [id number] [place]: [phone] F: [phone]       COPY SCANNED

## 2017-04-24 ENCOUNTER — Emergency Department: Admit: 2017-04-25 | Payer: BLUE CROSS/BLUE SHIELD | Primary: Internal Medicine

## 2017-04-24 ENCOUNTER — Inpatient Hospital Stay
Admit: 2017-04-24 | Discharge: 2017-04-25 | Disposition: A | Discharge status: Other Acute Inpatient Hospital | Payer: BLUE CROSS/BLUE SHIELD | Attending: Emergency Medicine

## 2017-04-24 ENCOUNTER — Emergency Department: Payer: BLUE CROSS/BLUE SHIELD | Primary: Internal Medicine

## 2017-04-24 DIAGNOSIS — I249 Acute ischemic heart disease, unspecified: Secondary | ICD-10-CM

## 2017-04-24 MED ORDER — OXYCODONE-ACETAMINOPHEN 5 MG-325 MG TAB
5-325 mg | ORAL | Status: AC
Start: 2017-04-24 — End: 2017-04-24
  Administered 2017-04-24: via ORAL

## 2017-04-24 MED ORDER — SODIUM CHLORIDE 0.9% BOLUS IV
0.9 % | Freq: Once | INTRAVENOUS | Status: AC
Start: 2017-04-24 — End: 2017-04-24
  Administered 2017-04-25: via INTRAVENOUS

## 2017-04-24 MED ORDER — KETOROLAC TROMETHAMINE 30 MG/ML INJECTION
30 mg/mL (1 mL) | INTRAMUSCULAR | Status: AC
Start: 2017-04-24 — End: 2017-04-24
  Administered 2017-04-25: via INTRAVENOUS

## 2017-04-24 MED FILL — KETOROLAC TROMETHAMINE 30 MG/ML INJECTION: 30 mg/mL (1 mL) | INTRAMUSCULAR | Qty: 1

## 2017-04-24 MED FILL — OXYCODONE-ACETAMINOPHEN 5 MG-325 MG TAB: 5-325 mg | ORAL | Qty: 1

## 2017-04-24 MED FILL — SODIUM CHLORIDE 0.9 % IV: INTRAVENOUS | Qty: 1000

## 2017-04-24 NOTE — ED Triage Notes (Signed)
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  States last chemo treatment was 2 weeks ago. Advises that medications via mail delivery have been delayed and have not arrived in past two days.  C/o generalized body pain.  Advises that her oncologist advised her to report to ER for hydration and pain control.

## 2017-04-24 NOTE — ED Notes (Signed)
Consult:  Discussed care with Dr. Hart Carwin Standard discussion; including history of patient???s chief complaint, available diagnostic results, and treatment course. Agrees to administer Lovenox 80 since plts are above 30.  11:31 PM, 04/24/2017

## 2017-04-24 NOTE — ED Provider Notes (Addendum)
EMERGENCY DEPARTMENT HISTORY AND PHYSICAL EXAM    7:18 PM      Date: 04/24/2017  Patient Name: [name] Wu    History of Presenting Illness     Chief Complaint   Patient presents with   ??? Generalized Body Aches   ??? Dehydration         History Provided By: Patient    7:18 PM Edelyn Heidel is a 56 y.o. female with h/o HTN, DM, leukemia who presents to ED complaining of acute on chronic Myalgia of moderate severity with associated symptoms of general abdominal pain (onset by therapy injections), dehydration, and mouth sores onset days ago. Patient is a chemotherapy patient who presents for therapy every 21 days, last treatment was 12/8. Patient notes running out of her Oxycodone yesterday causing her to be in constant pain. Magic mouth wash may help alleviate mouth sores. Patient denies fever, cough, N/V/D. Patient began chemotherapy in October. No other concerns or symptoms at this time.      PCP: Vergie Living, MD        Current Outpatient Medications   Medication Sig Dispense Refill   ??? oxyCODONE IR (ROXICODONE) 10 mg tab immediate release tablet Take 10 mg by mouth every four (4) hours as needed.         Past History     Past Medical History:  Past Medical History:   Diagnosis Date   ??? Diabetes ([place])    ??? Hx antineoplastic chemotherapy    ??? Hypertension    ??? Leukemia ([place])        Past Surgical History:  History reviewed. No pertinent surgical history.    Family History:  History reviewed. No pertinent family history.    Social History:  Social History     Tobacco Use   ??? Smoking status: Never Smoker   ??? Smokeless tobacco: Never Used   Substance Use Topics   ??? Alcohol use: No     Frequency: Never   ??? Drug use: No       Allergies:  Allergies   Allergen Reactions   ??? Pcn [Penicillins] Hives and Itching         Review of Systems     Review of Systems   Constitutional: Negative for fever.   HENT: Positive for mouth sores. Negative for congestion.    Respiratory: Negative for cough and shortness of breath.     Cardiovascular: Negative for chest pain.   Gastrointestinal: Positive for abdominal pain. Negative for vomiting.   Musculoskeletal: Positive for myalgias.   Skin: Negative for rash.   Neurological: Negative for light-headedness.   All other systems reviewed and are negative.        Physical Exam     Visit Vitals  BP 146/70   Pulse (!) 106   Temp 99.3 ??F (37.4 ??C)   Resp 22   Ht 5' (1.524 m)   Wt 77.1 kg (170 lb)   SpO2 100%   BMI 33.20 kg/m??       Physical Exam   Constitutional: She is oriented to person, place, and time. She appears well-developed.   HENT:   Head: Normocephalic.   Mouth/Throat: Oropharynx is clear and moist.   Diffuse ulcers   Eyes: Pupils are equal, round, and reactive to light.   Neck: Normal range of motion.   Cardiovascular: Regular rhythm and normal heart sounds. Tachycardia present.   No murmur heard.  Pulmonary/Chest: Effort normal. She has no wheezes. She has no rales.  Abdominal: Soft. There is no tenderness.   Musculoskeletal: Normal range of motion. She exhibits no edema.   Neurological: She is alert and oriented to person, place, and time.   Skin: Skin is warm and dry.   Nursing note and vitals reviewed.        Diagnostic Study Results     Vitals:  Patient Vitals for the past 12 hrs:   Temp Pulse Resp BP SpO2   04/24/17 2100 ??? (!) 106 22 146/70 100 %   04/24/17 2030 ??? (!) 105 22 145/73 98 %   04/24/17 1827 99.3 ??F (37.4 ??C) (!) 112 18 136/80 100 %       Medications ordered:   Medications   sodium chloride 0.9 % bolus infusion 1,000 mL (1,000 mL IntraVENous New Bag 04/24/17 1901)   oxyCODONE-acetaminophen (PERCOCET) 5-325 mg per tablet 1 Tab (1 Tab Oral Given 04/24/17 1843)   ketorolac (TORADOL) injection 15 mg (15 mg IntraVENous Given 04/24/17 1901)   oxyCODONE-acetaminophen (PERCOCET) 5-325 mg per tablet 1 Tab (1 Tab Oral Given 04/24/17 1927)   aspirin chewable tablet 81 mg (81 mg Oral Given 04/24/17 2159)   nitroglycerin (NITROBID) 2 % ointment 0.5 Inch (0.5 Inches Topical Given  04/24/17 2159)       Lab findings:  Recent Results (from the past 12 hour(s))   CBC WITH AUTOMATED DIFF    Collection Time: 04/24/17  7:05 PM   Result Value Ref Range    WBC 67.5 (HH) 4.6 - 13.2 K/uL    RBC 3.06 (L) 4.20 - 5.30 M/uL    HGB 9.2 (L) 12.0 - 16.0 g/dL    HCT 27.5 (L) 35.0 - 45.0 %    MCV 89.9 74.0 - 97.0 FL    MCH 30.1 24.0 - 34.0 PG    MCHC 33.5 31.0 - 37.0 g/dL    RDW 15.3 (H) 11.6 - 14.5 %    PLATELET 43 (L) 135 - 420 K/uL    MPV 8.7 (L) 9.2 - 11.8 FL    NEUTROPHILS 76 (H) 42 - 75 %    BAND NEUTROPHILS 6 (H) 0 - 5 %    LYMPHOCYTES 9 (L) 20 - 51 %    MONOCYTES 9 2 - 9 %    EOSINOPHILS 0 0 - 5 %    BASOPHILS 0 0 - 3 %    ABS. NEUTROPHILS 55.3 (H) 1.8 - 8.0 K/UL    ABS. LYMPHOCYTES 6.1 (H) 0.8 - 3.5 K/UL    ABS. MONOCYTES 6.1 (H) 0 - 1.0 K/UL    ABS. EOSINOPHILS 0.0 0.0 - 0.4 K/UL    ABS. BASOPHILS 0.0 0.0 - 0.06 K/UL    DF MANUAL      PLATELET COMMENTS DECREASED PLATELETS      RBC COMMENTS NORMOCYTIC, NORMOCHROMIC     METABOLIC PANEL, COMPREHENSIVE    Collection Time: 04/24/17  7:05 PM   Result Value Ref Range    Sodium 139 136 - 145 mmol/L    Potassium 3.8 3.5 - 5.5 mmol/L    Chloride 103 100 - 108 mmol/L    CO2 26 21 - 32 mmol/L    Anion gap 10 3.0 - 18 mmol/L    Glucose 151 (H) 74 - 99 mg/dL    BUN 8 7.0 - 18 MG/DL    Creatinine 0.85 0.6 - 1.3 MG/DL    BUN/Creatinine ratio 9 (L) 12 - 20      GFR est AA >60 >60 ml/min/1.55m  GFR est non-AA >60 >60 ml/min/1.28m    Calcium 9.6 8.5 - 10.1 MG/DL    Bilirubin, total 0.4 0.2 - 1.0 MG/DL    ALT (SGPT) 23 13 - 56 U/L    AST (SGOT) 43 (H) 15 - 37 U/L    Alk. phosphatase 166 (H) 45 - 117 U/L    Protein, total 6.1 (L) 6.4 - 8.2 g/dL    Albumin 3.3 (L) 3.4 - 5.0 g/dL    Globulin 2.8 2.0 - 4.0 g/dL    A-G Ratio 1.2 0.8 - 1.7     CARDIAC PANEL,(CK, CKMB & TROPONIN)    Collection Time: 04/24/17  7:05 PM   Result Value Ref Range    CK 91 26 - 192 U/L    CK - MB 1.9 <3.6 ng/ml    CK-MB Index 2.1 0.0 - 4.0 %    Troponin-I, Qt. 0.65 (H) 0.00 - 0.06 NG/ML    EKG, 12 LEAD, INITIAL    Collection Time: 04/24/17  9:07 PM   Result Value Ref Range    Ventricular Rate 107 BPM    Atrial Rate 107 BPM    P-R Interval 128 ms    QRS Duration 66 ms    Q-T Interval 358 ms    QTC Calculation (Bezet) 477 ms    Calculated P Axis 66 degrees    Calculated R Axis 23 degrees    Calculated T Axis 49 degrees    Diagnosis       Sinus tachycardia  Septal infarct , age undetermined  Abnormal ECG  No previous ECGs available           X-Ray, CT or other radiology findings or impressions:  No orders to display       Progress notes, Consult notes or additional Procedure notes:   Consult:  Discussed care with Dr. AHart Carwin PCP. Standard discussion; including history of patient???s chief complaint, available diagnostic results, and treatment course. Discussed prior history. Recommends holding Dasatatimib for 1-2 days and rec admitting physician to call him as needed, at his cell phone [phone] 9:47 PM, 04/24/2017     Pt w no chest pain.   Pt request tx to [facility] for [facility]    Consult:  Discussed care with Dr. HSandi Mealy EM Standard discussion; including history of patient???s chief complaint, available diagnostic results, and treatment course. [facility]  9:59 PM, 04/24/2017       11:32 PM d/w dr AHart Carwin agrees w lovenox even w slight dec plt  Disposition:  Diagnosis:   1. ACS [place]    2. Myalgia        Disposition: tx to [facility]    Follow-up Information    None             Medication List      ASK your doctor about these medications    oxyCODONE IR 10 mg Tab immediate release tablet  Commonly known as:  ROXICODONE              Scribe Attestation     Alaaeddine Talab acting as a sEducation administratorfor and in the presence of JDarlina Rumpf MD      April 24, 2017 at 7:18 PM       Provider Attestation:      I personally performed the services described in the documentation,  reviewed the documentation, as recorded by the scribe in my presence, and it accurately and completely  records my words and actions. April 24, 2017 at 7:18 PM - Darlina Rumpf, MD

## 2017-04-24 NOTE — ED Notes (Signed)
Patient instructed by Dr Earley Brooke not to take Dasatinib 140mg  on 04/25/2017.

## 2017-04-24 NOTE — ED Notes (Signed)
Bedside shift change report given to Joe RN  (oncoming nurse) by Nyoka Lint (offgoing nurse). Report included the following information SBAR.

## 2017-04-24 NOTE — ED Notes (Cosign Needed)
I performed a brief evaluation, including history and physical, of the patient here in triage and I have determined that pt will need further treatment and evaluation from the main side ER physician.  I have placed initial orders to help in expediting patients care.     April 24, 2017 at 6:34 PM - August Saucer, PA-C        Visit Vitals  BP 136/80 (BP 1 Location: Left arm, BP Patient Position: At rest)   Pulse (!) 112   Temp 99.3 ??F (37.4 ??C)   Resp 18   Ht 5' (1.524 m)   Wt 77.1 kg (170 lb)   SpO2 100%   BMI 33.20 kg/m??

## 2017-04-24 NOTE — ED Notes (Signed)
Socks given to patient at patient request.

## 2017-04-24 NOTE — ED Notes (Signed)
TRANSFER - OUT REPORT:    Verbal report given to Carlyon Shadow RN(name) on [name] Wu  being transferred to [facility], Emergency Department (unit) for routine progression of care       Report consisted of patient???s Situation, Background, Assessment and   Recommendations(SBAR).     Information from the following report(s) SBAR, Kardex, MAR, Recent Results and Cardiac Rhythm Sinus Tachycardia was reviewed with the receiving nurse.    Lines:   Peripheral IV 04/24/17 Left Antecubital (Active)   Site Assessment Clean, dry, & intact 04/24/2017  7:08 PM   Phlebitis Assessment 0 04/24/2017  7:08 PM   Infiltration Assessment 0 04/24/2017  7:08 PM   Dressing Status Clean, dry, & intact 04/24/2017  7:08 PM   Dressing Type Transparent 04/24/2017  7:08 PM   Hub Color/Line Status Blue;Patent 04/24/2017  7:08 PM        Opportunity for questions and clarification was provided.      Patient transported with:   Monitor  O2 @ 2 liters via Nasal Cannula.

## 2017-04-25 ENCOUNTER — Inpatient Hospital Stay
Admit: 2017-04-25 | Discharge: 2017-04-26 | Disposition: A | Discharge status: Home / Self Care | Payer: BLUE CROSS/BLUE SHIELD | Source: Other Acute Inpatient Hospital | Attending: Internal Medicine | Admitting: Internal Medicine

## 2017-04-25 ENCOUNTER — Emergency Department: Admit: 2017-04-25 | Payer: BLUE CROSS/BLUE SHIELD | Primary: Internal Medicine

## 2017-04-25 DIAGNOSIS — I248 Other forms of acute ischemic heart disease: Secondary | ICD-10-CM

## 2017-04-25 LAB — METABOLIC PANEL, COMPREHENSIVE
A-G Ratio: 1.2 (ref 0.8–1.7)
ALT (SGPT): 23 U/L (ref 13–56)
ALT (SGPT): 18 U/L (ref 12–78)
ALT (SGPT): 19 U/L (ref 12–78)
AST (SGOT): 43 U/L — ABNORMAL HIGH (ref 15–37)
AST (SGOT): 37 U/L (ref 15–37)
Albumin: 3.3 g/dL — ABNORMAL LOW (ref 3.4–5.0)
Albumin: 3.1 gm/dl — ABNORMAL LOW (ref 3.4–5.0)
Albumin: 3.2 gm/dl — ABNORMAL LOW (ref 3.4–5.0)
Alk. phosphatase: 166 U/L — ABNORMAL HIGH (ref 45–117)
Alk. phosphatase: 160 U/L — ABNORMAL HIGH (ref 45–117)
Alk. phosphatase: 211 U/L — ABNORMAL HIGH (ref 45–117)
Anion gap: 10 mmol/L (ref 3.0–18)
Anion gap: 10 mmol/L (ref 5–15)
Anion gap: 9 mmol/L (ref 5–15)
BUN/Creatinine ratio: 9 — ABNORMAL LOW (ref 12–20)
BUN: 8 MG/DL (ref 7.0–18)
BUN: 7 mg/dl (ref 7–25)
BUN: 4 mg/dl — ABNORMAL LOW (ref 7–25)
Bilirubin, total: 0.4 MG/DL (ref 0.2–1.0)
Bilirubin, total: 0.5 mg/dl (ref 0.2–1.0)
Bilirubin, total: 0.4 mg/dl (ref 0.2–1.0)
CO2: 26 mmol/L (ref 21–32)
CO2: 24 mEq/L (ref 21–32)
CO2: 26 mEq/L (ref 21–32)
Calcium: 9.6 MG/DL (ref 8.5–10.1)
Calcium: 8.3 mg/dl — ABNORMAL LOW (ref 8.5–10.1)
Calcium: 8.2 mg/dl — ABNORMAL LOW (ref 8.5–10.1)
Chloride: 103 mmol/L (ref 100–108)
Chloride: 107 mEq/L (ref 98–107)
Chloride: 106 mEq/L (ref 98–107)
Creatinine: 0.85 MG/DL (ref 0.6–1.3)
Creatinine: 0.7 mg/dl (ref 0.6–1.3)
Creatinine: 0.6 mg/dl (ref 0.6–1.3)
GFR est AA: >60 mL/min/{1.73_m2} (ref >60)
GFR est AA: >60
GFR est AA: >60
GFR est non-AA: >60 mL/min/{1.73_m2} (ref >60)
GFR est non-AA: >60
GFR est non-AA: >60
Globulin: 2.8 g/dL (ref 2.0–4.0)
Glucose: 151 mg/dL — ABNORMAL HIGH (ref 74–99)
Glucose: 156 mg/dl — ABNORMAL HIGH (ref 74–106)
Glucose: 162 mg/dl — ABNORMAL HIGH (ref 74–106)
Potassium: 3.8 mmol/L (ref 3.5–5.5)
Potassium: 3.8 mEq/L (ref 3.5–5.1)
Protein, total: 6.1 g/dL — ABNORMAL LOW (ref 6.4–8.2)
Protein, total: 5.7 gm/dl — ABNORMAL LOW (ref 6.4–8.2)
Protein, total: 5.5 gm/dl — ABNORMAL LOW (ref 6.4–8.2)
Sodium: 139 mmol/L (ref 136–145)
Sodium: 140 mEq/L (ref 136–145)
Sodium: 141 mEq/L (ref 136–145)

## 2017-04-25 LAB — URINALYSIS W/ RFLX MICROSCOPIC
Bilirubin: NEGATIVE
Glucose: NEGATIVE mg/dl
Ketone: 15 mg/dl — AB
Leukocyte Esterase: NEGATIVE
Nitrites: NEGATIVE
Protein: 30 mg/dl — AB
Specific gravity: 1.02 (ref 1.005–1.030)
Urobilinogen: 0.2 mg/dl (ref 0.0–1.0)
pH (UA): 5.5 (ref 5.0–9.0)

## 2017-04-25 LAB — CBC WITH AUTOMATED DIFF
ABS. BASOPHILS: 0 10*3/uL (ref 0.0–0.06)
ABS. EOSINOPHILS: 0 10*3/uL (ref 0.0–0.4)
ABS. LYMPHOCYTES: 6.1 10*3/uL — ABNORMAL HIGH (ref 0.8–3.5)
ABS. MONOCYTES: 6.1 10*3/uL — ABNORMAL HIGH (ref 0–1.0)
ABS. NEUTROPHILS: 55.3 10*3/uL — ABNORMAL HIGH (ref 1.8–8.0)
BAND NEUTROPHILS: 6 % — ABNORMAL HIGH (ref 0–5)
BAND NEUTROPHILS: 7.5 % (ref 0–11)
BAND NEUTROPHILS: 15.2 % — ABNORMAL HIGH (ref 0–11)
BASOPHILS: 0 % (ref 0–3)
BASOPHILS: 0.1 % (ref 0–3)
EOSINOPHILS: 0 % (ref 0–5)
EOSINOPHILS: 0 % (ref 0–5)
HCT: 27.5 % — ABNORMAL LOW (ref 35.0–45.0)
HCT: 24.5 % — ABNORMAL LOW (ref 37.0–50.0)
HCT: 24.2 % — ABNORMAL LOW (ref 37.0–50.0)
HGB: 9.2 g/dL — ABNORMAL LOW (ref 12.0–16.0)
HGB: 8.1 gm/dl — ABNORMAL LOW (ref 13.0–17.2)
HGB: 8.1 gm/dl — ABNORMAL LOW (ref 13.0–17.2)
IMMATURE GRANULOCYTES: 13.2 % — CR (ref 0.0–3.0)
LYMPHOCYTES: 9 % — ABNORMAL LOW (ref 20–51)
LYMPHOCYTES: 3.8 % — ABNORMAL LOW (ref 28–48)
LYMPHOCYTES: 0.9 % — ABNORMAL LOW (ref 28–48)
MCH: 30.1 PG (ref 24.0–34.0)
MCH: 29.7 pg (ref 25.4–34.6)
MCH: 29.6 pg (ref 25.4–34.6)
MCHC: 33.5 g/dL (ref 31.0–37.0)
MCHC: 33.1 gm/dl (ref 30.0–36.0)
MCHC: 33.5 gm/dl (ref 30.0–36.0)
MCV: 89.9 FL (ref 74.0–97.0)
MCV: 89.7 fL (ref 80.0–98.0)
MCV: 88.3 fL (ref 80.0–98.0)
METAMYELOCYTES: 3.8 % — ABNORMAL HIGH (ref 0–0)
METAMYELOCYTES: 6.7 % — ABNORMAL HIGH (ref 0–0)
MONOCYTES: 9 % (ref 2–9)
MONOCYTES: 3.8 % (ref 1–13)
MONOCYTES: 4.8 % (ref 1–13)
MPV: 8.7 FL — ABNORMAL LOW (ref 9.2–11.8)
MPV: 10.8 fL — ABNORMAL HIGH (ref 6.0–10.0)
MPV: 9.5 fL (ref 6.0–10.0)
MYELOCYTES: 2.8 % — ABNORMAL HIGH (ref 0–0)
MYELOCYTES: 1.9 % — ABNORMAL HIGH (ref 0–0)
Macrocytes: 1
Microcytes: 2
NEUTROPHILS: 76 % — ABNORMAL HIGH (ref 42–75)
NEUTROPHILS: 72.6 % — ABNORMAL HIGH (ref 34–64)
NEUTROPHILS: 69.5 % — ABNORMAL HIGH (ref 34–64)
NRBC: 2
PLATELET COMMENTS: DECREASED
PLATELET COMMENTS: DECREASED
PLATELET COMMENTS: DECREASED
PLATELET: 43 10*3/uL — ABNORMAL LOW (ref 135–420)
PLATELET: 35 10*3/uL — CL (ref 140–450)
PLATELET: 34 10*3/uL — CL (ref 140–450)
PROMYELOCYTES: 4
RBC: 3.06 M/uL — ABNORMAL LOW (ref 4.20–5.30)
RBC: 2.73 M/uL — ABNORMAL LOW (ref 3.60–5.20)
RBC: 2.74 M/uL — ABNORMAL LOW (ref 3.60–5.20)
RDW-SD: 48.6 — ABNORMAL HIGH (ref 36.4–46.3)
RDW-SD: 49.3 — ABNORMAL HIGH (ref 36.4–46.3)
RDW: 15.3 % — ABNORMAL HIGH (ref 11.6–14.5)
Smudge cells: 4.8
UNIDENTIFIED MONONUCLEAR CELL: 1.9 % — ABNORMAL HIGH (ref 0–0)
UNIDENTIFIED MONONUCLEAR CELL: 1 % — ABNORMAL HIGH (ref 0–0)
WBC: 67.5 10*3/uL — CR (ref 4.6–13.2)
WBC: 50.4 10*3/uL — CR (ref 4.0–11.0)
WBC: 70.2 10*3/uL — CR (ref 4.0–11.0)

## 2017-04-25 LAB — EKG, 12 LEAD, INITIAL
Atrial Rate: 107 {beats}/min
Calculated P Axis: 66 degrees
Calculated R Axis: 23 degrees
Calculated T Axis: 49 degrees
P-R Interval: 128 ms
Q-T Interval: 358 ms
QRS Duration: 66 ms
QTC Calculation (Bezet): 477 ms
Ventricular Rate: 107 {beats}/min

## 2017-04-25 LAB — HEMOGLOBIN A1C W/O EAG: Hemoglobin A1c: 6.4 % — ABNORMAL HIGH (ref 4.2–6.3)

## 2017-04-25 LAB — PROTHROMBIN TIME + INR
INR: 1.1 (ref 0.1–1.1)
Prothrombin time: 12.5 seconds (ref 10.2–12.9)

## 2017-04-25 LAB — MAGNESIUM: Magnesium: 1.9 mg/dl (ref 1.6–2.6)

## 2017-04-25 LAB — LACTIC ACID: Lactic Acid: 0.8 mmol/L (ref 0.4–2.0)

## 2017-04-25 LAB — LIPID PANEL
CHOL/HDL Ratio: 1.6 Ratio (ref 0.0–4.4)
Cholesterol, total: 110 mg/dl — ABNORMAL LOW (ref 140–199)
HDL Cholesterol: 68 mg/dl (ref 40–96)
LDL, calculated: 5 mg/dl (ref 0–130)
Triglyceride: 185 mg/dl — ABNORMAL HIGH (ref 29–150)

## 2017-04-25 LAB — TROPONIN I
Troponin-I, QT: 0.53 NG/ML — ABNORMAL HIGH (ref 0.00–0.06)
Troponin-I: 0.52 ng/ml — ABNORMAL HIGH (ref 0.000–0.045)
Troponin-I: 0.282 ng/ml — ABNORMAL HIGH (ref 0.000–0.045)

## 2017-04-25 LAB — POC URINE MICROSCOPIC

## 2017-04-25 LAB — PROCALCITONIN: PROCALCITONIN: 0.88 ng/ml — ABNORMAL HIGH (ref 0.00–0.50)

## 2017-04-25 LAB — CARDIAC PANEL,(CK, CKMB & TROPONIN)
CK - MB: 1.9 ng/ml (ref <3.6)
CK-MB Index: 2.1 % (ref 0.0–4.0)
CK: 91 U/L (ref 26–192)
Troponin-I, QT: 0.65 NG/ML — ABNORMAL HIGH (ref 0.00–0.06)

## 2017-04-25 LAB — PHOSPHORUS: Phosphorus: 3.7 mg/dl (ref 2.5–4.9)

## 2017-04-25 LAB — D DIMER: D DIMER: 3.7 ug/ml(FEU) — ABNORMAL HIGH (ref <0.46)

## 2017-04-25 LAB — CALCIUM: Calcium: 7.4 mg/dl — ABNORMAL LOW (ref 8.5–10.1)

## 2017-04-25 LAB — T4, FREE: Free T4: 1.14 ng/dl (ref 0.76–1.46)

## 2017-04-25 LAB — TSH 3RD GENERATION: TSH: 2.34 u[IU]/mL (ref 0.358–3.740)

## 2017-04-25 LAB — EKG 12-LEAD
Atrial Rate: 107 {beats}/min
P Axis: 66 degrees
P-R Interval: 128 ms
Q-T Interval: 358 ms
QRS Duration: 66 ms
QTc Calculation (Bazett): 477 ms
R Axis: 23 degrees
T Axis: 49 degrees
Ventricular Rate: 107 {beats}/min

## 2017-04-25 LAB — D-DIMER, QUANTITATIVE: D-Dimer, Quant: 3.7 ug/ml(FEU) — ABNORMAL HIGH (ref <0.46)

## 2017-04-25 MED ORDER — ENOXAPARIN 80 MG/0.8 ML SUB-Q SYRINGE
80 mg/0.8 mL | SUBCUTANEOUS | Status: AC
Start: 2017-04-25 — End: 2017-04-24
  Administered 2017-04-25: 05:00:00 via SUBCUTANEOUS

## 2017-04-25 MED ORDER — OXYCODONE 5 MG TAB
5 mg | ORAL | Status: AC
Start: 2017-04-25 — End: 2017-04-25
  Administered 2017-04-25: 07:00:00 via ORAL

## 2017-04-25 MED ORDER — ELECTROLYTE REPLACEMENT PROTOCOL
Status: DC | PRN
Start: 2017-04-25 — End: 2017-04-26

## 2017-04-25 MED ORDER — MORPHINE 4 MG/ML SYRINGE
4 mg/mL | Freq: Once | INTRAMUSCULAR | Status: AC
Start: 2017-04-25 — End: 2017-04-25
  Administered 2017-04-25: 14:00:00 via INTRAVENOUS

## 2017-04-25 MED ORDER — IOPAMIDOL 76 % IV SOLN
370 mg iodine /mL (76 %) | Freq: Once | INTRAVENOUS | Status: AC
Start: 2017-04-25 — End: 2017-04-25
  Administered 2017-04-25: 07:00:00 via INTRAVENOUS

## 2017-04-25 MED ORDER — OXYCODONE 5 MG TAB
5 mg | Freq: Four times a day (QID) | ORAL | Status: DC | PRN
Start: 2017-04-25 — End: 2017-04-25
  Administered 2017-04-25: 18:00:00 via ORAL

## 2017-04-25 MED ORDER — ACYCLOVIR 200 MG CAP
200 mg | Freq: Two times a day (BID) | ORAL | Status: DC
Start: 2017-04-25 — End: 2017-04-26
  Administered 2017-04-25: 18:00:00 via ORAL

## 2017-04-25 MED ORDER — ACYCLOVIR 800 MG TAB
800 mg | Freq: Two times a day (BID) | ORAL | Status: DC
Start: 2017-04-25 — End: 2017-04-25

## 2017-04-25 MED ORDER — NITROGLYCERIN 2 % TRANSDERMAL OINTMENT
2 % | TRANSDERMAL | Status: AC
Start: 2017-04-25 — End: 2017-04-24
  Administered 2017-04-25: 03:00:00 via TOPICAL

## 2017-04-25 MED ORDER — NALOXONE 0.4 MG/ML INJECTION
0.4 mg/mL | INTRAMUSCULAR | Status: DC | PRN
Start: 2017-04-25 — End: 2017-04-26

## 2017-04-25 MED ORDER — SODIUM CHLORIDE 0.9% BOLUS IV
0.9 % | INTRAVENOUS | Status: AC
Start: 2017-04-25 — End: 2017-04-25
  Administered 2017-04-25: 08:00:00 via INTRAVENOUS

## 2017-04-25 MED ORDER — OXYCODONE-ACETAMINOPHEN 5 MG-325 MG TAB
5-325 mg | Freq: Four times a day (QID) | ORAL | Status: DC | PRN
Start: 2017-04-25 — End: 2017-04-25
  Administered 2017-04-26: via ORAL

## 2017-04-25 MED ORDER — OXYCODONE-ACETAMINOPHEN 5 MG-325 MG TAB
5-325 mg | ORAL | Status: AC
Start: 2017-04-25 — End: 2017-04-24
  Administered 2017-04-25: via ORAL

## 2017-04-25 MED ORDER — ASPIRIN 81 MG CHEWABLE TAB
81 mg | ORAL | Status: AC
Start: 2017-04-25 — End: 2017-04-24
  Administered 2017-04-25: 03:00:00 via ORAL

## 2017-04-25 MED ORDER — MORPHINE 4 MG/ML SYRINGE
4 mg/mL | INTRAMUSCULAR | Status: AC
Start: 2017-04-25 — End: 2017-04-25
  Administered 2017-04-25: 08:00:00 via INTRAVENOUS

## 2017-04-25 MED FILL — ACYCLOVIR 200 MG CAP: 200 mg | ORAL | Qty: 2

## 2017-04-25 MED FILL — ACYCLOVIR 800 MG TAB: 800 mg | ORAL | Qty: 1

## 2017-04-25 MED FILL — ELECTROLYTE REPLACEMENT PROTOCOL: Qty: 1

## 2017-04-25 MED FILL — NITRO-BID 2 % TRANSDERMAL OINTMENT: 2 % | TRANSDERMAL | Qty: 1

## 2017-04-25 MED FILL — OXYCODONE 5 MG TAB: 5 mg | ORAL | Qty: 2

## 2017-04-25 MED FILL — MORPHINE 4 MG/ML SYRINGE: 4 mg/mL | INTRAMUSCULAR | Qty: 2

## 2017-04-25 MED FILL — LOVENOX 80 MG/0.8 ML SUBCUTANEOUS SYRINGE: 80 mg/0.8 mL | SUBCUTANEOUS | Qty: 0.8

## 2017-04-25 MED FILL — MORPHINE 4 MG/ML SYRINGE: 4 mg/mL | INTRAMUSCULAR | Qty: 1

## 2017-04-25 MED FILL — ISOVUE-370  76 % INTRAVENOUS SOLUTION: 370 mg iodine /mL (76 %) | INTRAVENOUS | Qty: 80

## 2017-04-25 MED FILL — ASPIRIN 81 MG CHEWABLE TAB: 81 mg | ORAL | Qty: 1

## 2017-04-25 MED FILL — OXYCODONE-ACETAMINOPHEN 5 MG-325 MG TAB: 5-325 mg | ORAL | Qty: 1

## 2017-04-25 NOTE — ED Notes (Signed)
Had the secretary page Dr. Maebelle Munroe and we spoke on the phone about he patient pain complaint. He came down and saw the patient and prescribe some pain medication. He is currently talking to the patient.

## 2017-04-25 NOTE — ED Notes (Signed)
Patient refuse ultrasound because of the pressure that was being applied druing the procedure to her thigh. She states that her oncologist told her to no apply pressure to her bone because she is neutropenic.

## 2017-04-25 NOTE — ED Provider Notes (Signed)
[facility]  Emergency Department Treatment Report      Patient: [name] Wu Age: 56 y.o. Sex: female    Date of Birth: 01/21/1961 Admit Date: 04/25/2017 PCP: Lacey Jensen, MD   MRN: [id number]  CSN: [id number]     Room: ER32/ER32 Time Dictated: 1:19 AM      Attending MD: Minna Merritts, MD  APC:  Finis Bud, NP-C    Chief Complaint   Chief Complaint   Patient presents with   ??? Generalized Body Aches   ??? Abnormal Lab Results       History of Present Illness   56 y.o. female Who originally presented to the emergency department at [facility] with concern for body aches, dehydration and mouth sores.  She is a chemotherapy patient with a history of AML who gets her treatment in [place].  She states that her pain medication is managed to her doctors in [place] and she ran out of her oxycodone yesterday and now she is in constant pain.  She denies any recent fever cough, nausea or vomiting.  Also states that she had been having some intermittent chest pain on the left side that she describes as a pressure sensation that was nonradiating and not causing any shortness of breath.  At [facility] labs were obtained that did show she had an elevated white blood cell count over 60,000 and an elevated d-dimer and troponin.  She was sent to our emergency department for CTA of her chest and further evaluation and management.    Review of Systems   Constitutional: No fever or chills  ENT: No sore throat, difficulty swallowing, runny nose or ear pain.  Neck: No pain, stiffness, or swollen glands.   Respiratory: No cough, dyspnea or wheezing.  Cardiovascular: Chest pressure  Gastrointestinal: No vomiting, diarrhea or abdominal pain.  Genitourinary: No dysuria, frequency, or urgency.   Musculoskeletal: Generalized body aches  Integumentary: No rashes or wounds.  Neurological: No headaches or sensory motor symptoms    Denies complaints in all other systems.    Past Medical/Surgical History      Past Medical History:   Diagnosis Date   ??? Diabetes ([place])    ??? Hx antineoplastic chemotherapy    ??? Hypertension    ??? Leukemia ([place])      History reviewed. No pertinent surgical history.    Social History     Social History     Socioeconomic History   ??? Marital status: DIVORCED     Spouse name: Not on file   ??? Number of children: Not on file   ??? Years of education: Not on file   ??? Highest education level: Not on file   Social Needs   ??? Financial resource strain: Not on file   ??? Food insecurity - worry: Not on file   ??? Food insecurity - inability: Not on file   ??? Transportation needs - medical: Not on file   ??? Transportation needs - non-medical: Not on file   Occupational History   ??? Not on file   Tobacco Use   ??? Smoking status: Never Smoker   ??? Smokeless tobacco: Never Used   Substance and Sexual Activity   ??? Alcohol use: No     Frequency: Never   ??? Drug use: No   ??? Sexual activity: Not on file   Other Topics Concern   ??? Not on file   Social History Narrative   ??? Not on file  Family History   History reviewed. No pertinent family history.    Current Medications     Prior to Admission Medications   Prescriptions Last Dose Informant Patient Reported? Taking?   oxyCODONE IR (ROXICODONE) 10 mg tab immediate release tablet   Yes No   Sig: Take 10 mg by mouth every four (4) hours as needed.      Facility-Administered Medications: None       Allergies     Allergies   Allergen Reactions   ??? Pcn [Penicillins] Hives and Itching       Physical Exam      ED Triage Vitals [04/25/17 0045]   ED Encounter Vitals Group      BP 142/85      Pulse (Heart Rate) 90      Resp Rate 18      Temp 98.8 ??F (37.1 ??C)      Temp src       O2 Sat (%) 100 %      Weight 170 lb      Height 5'       Constitutional: Patient appears well developed and well nourished. Appearance and behavior are age and situation appropriate.   HEENT: Conjunctiva clear. EOMs intact.  PERRL. Mucous membranes moist, non-erythematous.    Respiratory: lungs clear to auscultation, nonlabored respirations. No tachypnea or accessory muscle use.  Cardiovascular: heart regular rate tachycardic and rhythm without murmur rubs or gallops. No peripheral edema. Distal pulses intact +2 bilaterally.   Gastrointestinal:  Abdomen soft, bowel sounds present x4, nontender without complaint of pain to palpation  Musculoskeletal:  No joint erythema or edema. Nail beds pink with prompt capillary refill  Integumentary: warm and dry without rashes or lesions  Neurologic: alert and oriented.     Impression and Management Plan   Patient with generalized pain due to her cancer diagnosis and chemotherapy treatments.  First 2 troponins were elevated, we will obtain a third troponin.  We will also obtain CT of chest that she had an elevated d-dimer at the previous hospital.  Will medicate supportively.    Diagnostic Studies   Lab:   Recent Results (from the past 12 hour(s))   CBC WITH AUTOMATED DIFF    Collection Time: 04/24/17  7:05 PM   Result Value Ref Range    WBC 67.5 (HH) 4.6 - 13.2 K/uL    RBC 3.06 (L) 4.20 - 5.30 M/uL    HGB 9.2 (L) 12.0 - 16.0 g/dL    HCT 27.5 (L) 35.0 - 45.0 %    MCV 89.9 74.0 - 97.0 FL    MCH 30.1 24.0 - 34.0 PG    MCHC 33.5 31.0 - 37.0 g/dL    RDW 15.3 (H) 11.6 - 14.5 %    PLATELET 43 (L) 135 - 420 K/uL    MPV 8.7 (L) 9.2 - 11.8 FL    NEUTROPHILS 76 (H) 42 - 75 %    BAND NEUTROPHILS 6 (H) 0 - 5 %    LYMPHOCYTES 9 (L) 20 - 51 %    MONOCYTES 9 2 - 9 %    EOSINOPHILS 0 0 - 5 %    BASOPHILS 0 0 - 3 %    ABS. NEUTROPHILS 55.3 (H) 1.8 - 8.0 K/UL    ABS. LYMPHOCYTES 6.1 (H) 0.8 - 3.5 K/UL    ABS. MONOCYTES 6.1 (H) 0 - 1.0 K/UL    ABS. EOSINOPHILS 0.0 0.0 - 0.4 K/UL    ABS. BASOPHILS 0.0  0.0 - 0.06 K/UL    DF MANUAL      PLATELET COMMENTS DECREASED PLATELETS      RBC COMMENTS NORMOCYTIC, NORMOCHROMIC     METABOLIC PANEL, COMPREHENSIVE    Collection Time: 04/24/17  7:05 PM   Result Value Ref Range    Sodium 139 136 - 145 mmol/L     Potassium 3.8 3.5 - 5.5 mmol/L    Chloride 103 100 - 108 mmol/L    CO2 26 21 - 32 mmol/L    Anion gap 10 3.0 - 18 mmol/L    Glucose 151 (H) 74 - 99 mg/dL    BUN 8 7.0 - 18 MG/DL    Creatinine 0.85 0.6 - 1.3 MG/DL    BUN/Creatinine ratio 9 (L) 12 - 20      GFR est AA >60 >60 ml/min/1.34m    GFR est non-AA >60 >60 ml/min/1.734m   Calcium 9.6 8.5 - 10.1 MG/DL    Bilirubin, total 0.4 0.2 - 1.0 MG/DL    ALT (SGPT) 23 13 - 56 U/L    AST (SGOT) 43 (H) 15 - 37 U/L    Alk. phosphatase 166 (H) 45 - 117 U/L    Protein, total 6.1 (L) 6.4 - 8.2 g/dL    Albumin 3.3 (L) 3.4 - 5.0 g/dL    Globulin 2.8 2.0 - 4.0 g/dL    A-G Ratio 1.2 0.8 - 1.7     CARDIAC PANEL,(CK, CKMB & TROPONIN)    Collection Time: 04/24/17  7:05 PM   Result Value Ref Range    CK 91 26 - 192 U/L    CK - MB 1.9 <3.6 ng/ml    CK-MB Index 2.1 0.0 - 4.0 %    Troponin-I, Qt. 0.65 (H) 0.00 - 0.06 NG/ML   EKG, 12 LEAD, INITIAL    Collection Time: 04/24/17  9:07 PM   Result Value Ref Range    Ventricular Rate 107 BPM    Atrial Rate 107 BPM    P-R Interval 128 ms    QRS Duration 66 ms    Q-T Interval 358 ms    QTC Calculation (Bezet) 477 ms    Calculated P Axis 66 degrees    Calculated R Axis 23 degrees    Calculated T Axis 49 degrees    Diagnosis       Sinus tachycardia  Septal infarct , age undetermined  Abnormal ECG  No previous ECGs available     D DIMER    Collection Time: 04/24/17 10:30 PM   Result Value Ref Range    D DIMER 3.70 (H) <0.46 ug/ml(FEU)   TROPONIN I    Collection Time: 04/24/17 10:30 PM   Result Value Ref Range    Troponin-I, Qt. 0.53 (H) 0.00 - 0.06 NG/ML   TROPONIN I    Collection Time: 04/25/17  1:25 AM   Result Value Ref Range    Troponin-I 0.520 (H) 0.000 - 0.045 ng/ml     Labs Reviewed   TROPONIN I - Abnormal; Notable for the following components:       Result Value    Troponin-I 0.520 (*)     All other components within normal limits       Imaging:    No results found.  CTA Chest  FINDINGS:   Pulmonary arteries: No pulmonary embolism identified.  Aorta: No thoracic aortic aneurysm or dissection.  Thyroid: Thyroid gland partially excluded from view but normal in size and appearance through its  visualized portion.  Lungs: Mild dependent atelectasis at the lung bases. No pulmonary consolidation.  Pleural space: No pleural effusion or pneumothorax.  Heart: Cardiomegaly.  Lymph nodes: No pathologically enlarged mediastinal or hilar lymph nodes.  Bones/joints: Lower ribs partially excluded from view and incompletely evaluated. Otherwise, no  acute fracture seen among the bones of the chest. Spinal degenerative changes with anterior  osteophytes at several mid-lower thoracic levels.  Soft tissues: Unremarkable.  IMPRESSION:  No active disease is seen in the chest.      ED Course/ Medical Decision Making   Patient remained stable in the emergency department.  She was given a dose of her home oxycodone to help with her pain.  CTA was obtained which was negative for any active disease and pulmonary embolus.  Third troponin was obtained which was 0.52 so this level is downtrending.  Patient had no further complaints of chest pressure in the emergency department.  She had no posterior calf pain or swelling.  Initially was tachycardic upon arrival but this is decreased after the ministration of the oxycodone.  However feel she require inpatient admission for further cardiac evaluation.  Her case was discussed with Dr. Lynnette Caffey who was accepted her for admission.  Patient has been concerned about giving herself her dose of Neupogen, however her white count is 67,000 at this time.  Will discuss with her oncologist if she needs her dose of this medication tonight.  She will be admitted in stable condition.  Medications   morphine injection 6 mg (not administered)   oxyCODONE IR (ROXICODONE) tablet 10 mg (10 mg Oral Given 04/25/17 0151)   iopamidol (ISOVUE-370) 76 % injection 80 mL (80 mL IntraVENous Given  04/25/17 0133)   sodium chloride 0.9 % bolus infusion 1,000 mL (1,000 mL IntraVENous New Bag 04/25/17 0300)         Final Diagnosis       ICD-10-CM ICD-9-CM   1. Chest pressure R07.89 786.59   2. Elevated troponin R74.8 790.6         Disposition   AdmissionCurrent Discharge Medication List        The patient was personally evaluated by myself and [facility], EMILY A, MD who agrees with the above assessment and plan.    Finis Bud, NP-C  April 25, 2017    My signature above authenticates this document and my orders, the final ??  diagnosis (es), discharge prescription (s), and instructions in the Epic ??  record.  If you have any questions please contact [phone].  ??  Nursing notes have been reviewed by the physician/ advanced practice ??  Clinician.    Dragon medical dictation software was used for portions of this report. Unintended voice recognition errors may occur.

## 2017-04-25 NOTE — ED Notes (Signed)
Rounded on patient and she is in bed in a position of comfort. Cal light is within reach and family is at bedside. Patient complain of a 7 out of 10 body pain. Will let the doctor know.

## 2017-04-25 NOTE — Progress Notes (Signed)
CD06 Ms Adderley - Can I pls have a diet order?  Also her WBC now is 70.2 and Platelet is 34  Thank you  Lerma

## 2017-04-25 NOTE — ED Notes (Signed)
Pt refusing redraw of labs. Dr. Lynnette Caffey paged.

## 2017-04-25 NOTE — Progress Notes (Signed)
Text page sent to MD    CD06, [name] Wu -   The brother came with her pain med bottle that just came in the mail. It is Oxycodone HCL 5 mg - 2 tabs every 6 hrs.   They also want to let you know that she has a flight scheduled at 12 noon Sunday to [facility] for her chemo. Wondering if she can be dcd tonight????  Thank you

## 2017-04-25 NOTE — ED Notes (Signed)
[name] husband at the nurses station states the patient feels sob. Pt was repositioned in bed. VSS. Pt is 100% on RA. Pt placed on [facility] for comfort and states she felt better after that.

## 2017-04-25 NOTE — Progress Notes (Signed)
Problem: Pain  Goal: *Control of Pain  Outcome: Progressing Towards Goal  Monitor pain and medicate patient to keep her comfortable

## 2017-04-25 NOTE — Other (Signed)
TRANSFER - OUT REPORT:    Verbal report given to [place](name) on [name] Wu  being transferred to CD6(unit) for routine progression of care       Report consisted of patient???s Situation, Background, Assessment and   Recommendations(SBAR).     Information from the following report(s) SBAR was reviewed with the receiving nurse.    Lines:   Peripheral IV 04/24/17 Left Antecubital (Active)   Site Assessment Clean, dry, & intact 04/25/2017  5:02 AM   Phlebitis Assessment 0 04/25/2017  5:02 AM   Infiltration Assessment 0 04/25/2017  5:02 AM   Dressing Status Clean, dry, & intact 04/25/2017  5:02 AM   Hub Color/Line Status Blue 04/25/2017  5:02 AM   Alcohol Cap Used Yes 04/25/2017  5:02 AM        Opportunity for questions and clarification was provided.      Patient transported with:   Registered Nurse

## 2017-04-25 NOTE — Progress Notes (Signed)
Attempted PVL exam at 8:45a.  Patient refused compressions and then refused exam until the hospital physician calls her oncologist to get confirmation that she can have exam. Patient is worried about her low white blood count.  Cara Dzendzel RVT,RVS

## 2017-04-25 NOTE — Progress Notes (Signed)
Please see Dr. Lynnette Caffey H&P from this morning.  Patient examined. Restarted morphine -- patient's brother when home to get her medical records including her medication list from her oncologist.  Repeated full labs due to incomplete results.  Talked with and consulted Dr. Edd Arbour from oncology.  PVL negative for DVT.  Patient afebrile and her BP stable -- although HR slightly high.

## 2017-04-25 NOTE — H&P (Signed)
Chart reviewed.  Patient seen and examined on 04/25/2017.  Complete H&P to follow.

## 2017-04-25 NOTE — Progress Notes (Signed)
PAGER ID: [phone]   MESSAGE: CD 06 Wynn, Shaylon(elevated troponin/chest pressure) pt states she takes oxycodone 10 mg q4 hrs prn. she has order Percocet 5/325 2 tablets Q 4 hrs prn. could it be change to just the oxycodone. [phone]. Thanks. Kenney Houseman RN

## 2017-04-25 NOTE — ED Triage Notes (Signed)
Pt arrives via medical transport from [place] er. Pt is currently under chemo treatment for leukemia. Pt has to be treated in [place]. Pt states she was prescribed oxycodone and was given a verbal order to increase her oxycodone, pt ran out of oxycodone early, the doctor in [place] placed a shipment in for her oxycodone to be delivered on Wednesday, which had still not arrived. Pt states her last dose of oxycodone was on Wednesday. Pt states she was advised to go to the er. Pt went to [facility] to c/o general abd pain, dehydration and mouth sores.. [facility] placed a 22G to the L AC. Pt states she does not have a port. Pt was transferred to this er due to ACS, elevated trop of 0.65 which was drawn at 1905, repeat trop @ 2230 was 0.53, D-Dimer was elevated 3.70. Pt was given 103ml NS bolus, 5/325 percocet x 2 doses (last dose @ 1927), 15mg  toradol, 81mg  asa, 0.5 inch nitro paste which is present to the R chest. Pt denies any chest pain, pt states that she has a chest pressure which started 3 days ago when she ran out of her oxycodone. Pt is NSR/Sinus Tach on the CM.

## 2017-04-25 NOTE — H&P (Signed)
History and Physical    DOS: April 25, 2017    Patient: [name] Wu               Sex: female          DOA: 04/25/2017       Date of Birth:  05/23/1960      Age:  56 y.o.        LOS:  LOS: 0 days       MRN: [id number]       Impression/Plan       56 y.o. female with hx of HTN, DM, and AML on Chemotherapy with the following:       1.  Leukocytosis   2.  Elevated D Dimer    3.  Elevated Troponin, suspect demand ischemia r/o NSTEMI   4.  Thrombocytopenia    5.  AML on Chemotherapy   6.  Generalized Myalgias      PLAN:    Admit to INPT, Stepdown for critical care management and monitoring.  O2 as needed.  GI and DVT prophylaxis.  Replace electrolytes as needed.  Cultures have been sent and are pending.  Trend lactic acid levels.  Monitor for signs of a developing infectious process and add antibiotic therapy if needed.  A hematology oncology consult has been initiated in the ED - pt is being treated at [facility] in [place] and ED provider will attempt to contact her provider  Consider local heme onc consult in AM if ED unable to reach pt's heme onc provider.                                                                               Serial cardiac isoenzymes.  Check 2-D echocardiogram.  Consider cardiology consult if cardiac enzymes remain elevated.  Check bilateral lower extremity PVL studies in the a.m.  Monitor platelets.  Transfuse if needed.  Symptomatic treatment for pain and nausea.  Continue home medications when possible.  Further recommendations based on test results, response to treatment, and clinical course.        Chief Complaint:     Chief Complaint   Patient presents with   ??? Generalized Body Aches   ??? Abnormal Lab Results       HPI:       [name] Wu is a 56 y.o. female with hx of HTN, DM, and AML on Chemotherapy who presents to the ED on transfer from [facility] after being evaluated initially for body aches, dehydration, mouth sores.   Patient has a history of AML and she is on chemotherapy through hematology oncology in [place].  She states that she ran out of her oxycodone yesterday and is now in constant pain.  She complains of chest pressure to the left side of her chest which is nonradiating.  She denies any fever or chills.  No nausea, vomiting, or diarrhea.  She denies any shortness of breath.  Her labs from [facility] demonstrate a white blood count of 67.5 and an elevated d-dimer and troponin level.  Pt states that she has been taking Neupogen s/p Chemotherapy treatments.  She was sent to [facility] for CTA of  her chest in addition to further evaluation and treatment.      Past Medical History:   Diagnosis Date   ??? Diabetes ([place])    ??? Hx antineoplastic chemotherapy    ??? Hypertension    ??? Leukemia ([place])        History reviewed. No pertinent surgical history.    History reviewed. No pertinent family history.    Social History     Socioeconomic History   ??? Marital status: DIVORCED     Spouse name: Not on file   ??? Number of children: Not on file   ??? Years of education: Not on file   ??? Highest education level: Not on file   Tobacco Use   ??? Smoking status: Never Smoker   ??? Smokeless tobacco: Never Used   Substance and Sexual Activity   ??? Alcohol use: No     Frequency: Never   ??? Drug use: No       Prior to Admission medications    Medication Sig Start Date End Date Taking? Authorizing Provider   acyclovir (ZOVIRAX) 400 mg tablet Take 400 mg by mouth two (2) times a day.   Yes Other, Phys, MD   tbo-filgrastim (GRANIX) 480 mcg/0.8 mL syrg injection 480 mcg by SubCUTAneous route once.   Yes Other, Phys, MD   insulin lispro (HUMALOG JUNIOR KWIKPEN U-100) 100 unit/mL inph by SubCUTAneous route three (3) times daily.   Yes Other, Phys, MD   potassium chloride SR (K-TAB) 20 mEq tablet Take 20 mEq by mouth two (2) times a day.   Yes Other, Phys, MD   levoFLOXacin (LEVAQUIN) 500 mg tablet Take 500 mg by mouth daily.   Yes  Other, Phys, MD   posaconazole (NOXAFIL) 100 mg DR tablet Take 300 mg by mouth daily (with breakfast).   Yes Other, Phys, MD   traMADol (ULTRAM) 50 mg tablet Take 50 mg by mouth every eight (8) hours as needed for Pain.   Yes Other, Phys, MD   insulin degludec (TRESIBA FLEXTOUCH U-200) 200 unit/mL (3 mL) inpn 20 Units by SubCUTAneous route daily.   Yes Other, Phys, MD   zolpidem (AMBIEN) 5 mg tablet Take 5 mg by mouth nightly.   Yes Other, Phys, MD   oxyCODONE IR (ROXICODONE) 10 mg tab immediate release tablet Take 10 mg by mouth every four (4) hours as needed.    Other, Phys, MD       Allergies   Allergen Reactions   ??? Pcn [Penicillins] Hives and Itching       Review of Systems:    1) See above. 2) A 12 point review of systems has been obtained. ROS is otherwise noncontributory.      Physical Exam:      Visit Vitals  BP 144/61   Pulse 96   Temp 98.8 ??F (37.1 ??C)   Resp 12   Ht 5' (1.524 m)   Wt 77.1 kg (170 lb)   SpO2 100%   BMI 33.20 kg/m??       No intake or output data in the 24 hours ending 04/25/17 0314    Physical Exam:    HEENT: NC/AT, PERRLA, EOMI, dry mucous membranes  Neck: No JVD no bruits, supple, nontender, no significant LAD  LYMPH: No supraclavicular or cervical or axillary nodes on both sides  Cardiovascular: Heart, RRR, no M, R, G  Lungs: decreased breath sounds at bases, coarse upper airway sounds, no wheezes or crackles  Abd: Soft, non  tender, not distended, No guarding, No rigidity, BS normal  NEURO:  Non focal, normal strength. CN II - XII intact bilaterally   Extrm: no leg edema   Skin: No rashes or lesions     Labs Reviewed:     Recent Results (from the past 24 hour(s))   CBC WITH AUTOMATED DIFF    Collection Time: 04/24/17  7:05 PM   Result Value Ref Range    WBC 67.5 (HH) 4.6 - 13.2 K/uL    RBC 3.06 (L) 4.20 - 5.30 M/uL    HGB 9.2 (L) 12.0 - 16.0 g/dL    HCT 27.5 (L) 35.0 - 45.0 %    MCV 89.9 74.0 - 97.0 FL    MCH 30.1 24.0 - 34.0 PG    MCHC 33.5 31.0 - 37.0 g/dL     RDW 15.3 (H) 11.6 - 14.5 %    PLATELET 43 (L) 135 - 420 K/uL    MPV 8.7 (L) 9.2 - 11.8 FL    NEUTROPHILS 76 (H) 42 - 75 %    BAND NEUTROPHILS 6 (H) 0 - 5 %    LYMPHOCYTES 9 (L) 20 - 51 %    MONOCYTES 9 2 - 9 %    EOSINOPHILS 0 0 - 5 %    BASOPHILS 0 0 - 3 %    ABS. NEUTROPHILS 55.3 (H) 1.8 - 8.0 K/UL    ABS. LYMPHOCYTES 6.1 (H) 0.8 - 3.5 K/UL    ABS. MONOCYTES 6.1 (H) 0 - 1.0 K/UL    ABS. EOSINOPHILS 0.0 0.0 - 0.4 K/UL    ABS. BASOPHILS 0.0 0.0 - 0.06 K/UL    DF MANUAL      PLATELET COMMENTS DECREASED PLATELETS      RBC COMMENTS NORMOCYTIC, NORMOCHROMIC     METABOLIC PANEL, COMPREHENSIVE    Collection Time: 04/24/17  7:05 PM   Result Value Ref Range    Sodium 139 136 - 145 mmol/L    Potassium 3.8 3.5 - 5.5 mmol/L    Chloride 103 100 - 108 mmol/L    CO2 26 21 - 32 mmol/L    Anion gap 10 3.0 - 18 mmol/L    Glucose 151 (H) 74 - 99 mg/dL    BUN 8 7.0 - 18 MG/DL    Creatinine 0.85 0.6 - 1.3 MG/DL    BUN/Creatinine ratio 9 (L) 12 - 20      GFR est AA >60 >60 ml/min/1.44m    GFR est non-AA >60 >60 ml/min/1.772m   Calcium 9.6 8.5 - 10.1 MG/DL    Bilirubin, total 0.4 0.2 - 1.0 MG/DL    ALT (SGPT) 23 13 - 56 U/L    AST (SGOT) 43 (H) 15 - 37 U/L    Alk. phosphatase 166 (H) 45 - 117 U/L    Protein, total 6.1 (L) 6.4 - 8.2 g/dL    Albumin 3.3 (L) 3.4 - 5.0 g/dL    Globulin 2.8 2.0 - 4.0 g/dL    A-G Ratio 1.2 0.8 - 1.7     CARDIAC PANEL,(CK, CKMB & TROPONIN)    Collection Time: 04/24/17  7:05 PM   Result Value Ref Range    CK 91 26 - 192 U/L    CK - MB 1.9 <3.6 ng/ml    CK-MB Index 2.1 0.0 - 4.0 %    Troponin-I, Qt. 0.65 (H) 0.00 - 0.06 NG/ML   EKG, 12 LEAD, INITIAL    Collection Time: 04/24/17  9:07 PM  Result Value Ref Range    Ventricular Rate 107 BPM    Atrial Rate 107 BPM    P-R Interval 128 ms    QRS Duration 66 ms    Q-T Interval 358 ms    QTC Calculation (Bezet) 477 ms    Calculated P Axis 66 degrees    Calculated R Axis 23 degrees    Calculated T Axis 49 degrees    Diagnosis       Sinus tachycardia   Septal infarct , age undetermined  Abnormal ECG  No previous ECGs available     D DIMER    Collection Time: 04/24/17 10:30 PM   Result Value Ref Range    D DIMER 3.70 (H) <0.46 ug/ml(FEU)   TROPONIN I    Collection Time: 04/24/17 10:30 PM   Result Value Ref Range    Troponin-I, Qt. 0.53 (H) 0.00 - 0.06 NG/ML   TROPONIN I    Collection Time: 04/25/17  1:25 AM   Result Value Ref Range    Troponin-I 0.520 (H) 0.000 - 0.045 ng/ml           Diagnostic:       CTA Chest:     CTA Chest  FINDINGS:  Pulmonary arteries: No pulmonary embolism identified.  Aorta: No thoracic aortic aneurysm or dissection.  Thyroid: Thyroid gland partially excluded from view but normal in size and appearance through its  visualized portion.  Lungs: Mild dependent atelectasis at the lung bases. No pulmonary consolidation.  Pleural space: No pleural effusion or pneumothorax.  Heart: Cardiomegaly.  Lymph nodes: No pathologically enlarged mediastinal or hilar lymph nodes.  Bones/joints: Lower ribs partially excluded from view and incompletely evaluated. Otherwise, no  acute fracture seen among the bones of the chest. Spinal degenerative changes with anterior  osteophytes at several mid-lower thoracic levels.  Soft tissues: Unremarkable.    IMPRESSION:  No active disease is seen in the chest.      Dragon medical dictation software was used for portions of this report.  Efforts have been made to edit the dictations, but occasionally words are mis-transcribed.    Critical Care time:  50 minutes       Knox Saliva, MD  04/25/2017  3:14 AM

## 2017-04-25 NOTE — ED Notes (Signed)
Ultrasound is in the room

## 2017-04-25 NOTE — Progress Notes (Signed)
PVL Bilateral Lower Extremity Venous Preliminary Results    Bilateral:  No evidence of thrombus identified in the deep veins and the proximal greater saphenous vein.    Final MD report to follow.  Exam Performed by: Cara Dzendzel, RVT,RVS

## 2017-04-25 NOTE — ED Notes (Signed)
Bedside and Verbal shift change report given to [place] (Soil scientist) by Colletta Dunkirk (offgoing nurse). Report included the following information SBAR.

## 2017-04-26 LAB — CULTURE, URINE
CULTURE RESULT: 25000 — AB
Culture result: 25000 — AB

## 2017-04-26 LAB — GLUCOSE, POC
Glucose (POC): 210 mg/dL — ABNORMAL HIGH (ref 65–105)
Glucose (POC): 177 mg/dL — ABNORMAL HIGH (ref 65–105)

## 2017-04-26 MED ORDER — ONDANSETRON (PF) 4 MG/2 ML INJECTION
4 mg/2 mL | INTRAMUSCULAR | Status: DC | PRN
Start: 2017-04-26 — End: 2017-04-26
  Administered 2017-04-26 (×2): via INTRAVENOUS

## 2017-04-26 MED ORDER — ZOLPIDEM 5 MG TAB
5 mg | Freq: Every evening | ORAL | Status: DC | PRN
Start: 2017-04-26 — End: 2017-04-26

## 2017-04-26 MED ORDER — OXYCODONE 5 MG TAB
5 mg | ORAL_TABLET | ORAL | 0 refills | Status: DC | PRN
Start: 2017-04-26 — End: 2019-12-28

## 2017-04-26 MED ORDER — TRAMADOL 50 MG TAB
50 mg | Freq: Four times a day (QID) | ORAL | Status: DC | PRN
Start: 2017-04-26 — End: 2017-04-26

## 2017-04-26 MED ORDER — OXYCODONE-ACETAMINOPHEN 5 MG-325 MG TAB
5-325 mg | ORAL | Status: DC | PRN
Start: 2017-04-26 — End: 2017-04-25

## 2017-04-26 MED ORDER — PANTOPRAZOLE 40 MG TAB, DELAYED RELEASE
40 mg | Freq: Every day | ORAL | Status: DC
Start: 2017-04-26 — End: 2017-04-25

## 2017-04-26 MED ORDER — ONDANSETRON (PF) 4 MG/2 ML INJECTION
4 mg/2 mL | Freq: Once | INTRAMUSCULAR | Status: AC | PRN
Start: 2017-04-26 — End: 2017-04-26
  Administered 2017-04-26: 08:00:00 via INTRAVENOUS

## 2017-04-26 MED ORDER — SODIUM CHLORIDE 0.9 % IV
INTRAVENOUS | Status: DC
Start: 2017-04-26 — End: 2017-04-26
  Administered 2017-04-26: 15:00:00 via INTRAVENOUS

## 2017-04-26 MED ORDER — DEXTROSE 5%-1/2 NORMAL SALINE IV
INTRAVENOUS | Status: DC
Start: 2017-04-26 — End: 2017-04-26
  Administered 2017-04-26: 02:00:00 via INTRAVENOUS

## 2017-04-26 MED ORDER — OXYCODONE 5 MG TAB
5 mg | ORAL | Status: DC | PRN
Start: 2017-04-26 — End: 2017-04-25

## 2017-04-26 MED ORDER — PANTOPRAZOLE 40 MG IV SOLR
40 mg | Freq: Two times a day (BID) | INTRAVENOUS | Status: DC
Start: 2017-04-26 — End: 2017-04-25

## 2017-04-26 MED ORDER — PANTOPRAZOLE 40 MG IV SOLR
40 mg | Freq: Two times a day (BID) | INTRAVENOUS | Status: DC
Start: 2017-04-26 — End: 2017-04-26
  Administered 2017-04-26 (×2): via INTRAVENOUS

## 2017-04-26 MED ORDER — MORPHINE 4 MG/ML SYRINGE
4 mg/mL | INTRAMUSCULAR | Status: DC | PRN
Start: 2017-04-26 — End: 2017-04-26
  Administered 2017-04-26 (×5): via INTRAVENOUS

## 2017-04-26 MED ORDER — OXYCODONE 5 MG TAB
5 mg | ORAL | Status: DC | PRN
Start: 2017-04-26 — End: 2017-04-26
  Administered 2017-04-26 (×4): via ORAL

## 2017-04-26 MED FILL — ONDANSETRON (PF) 4 MG/2 ML INJECTION: 4 mg/2 mL | INTRAMUSCULAR | Qty: 2

## 2017-04-26 MED FILL — OXYCODONE 5 MG TAB: 5 mg | ORAL | Qty: 1

## 2017-04-26 MED FILL — OXYCODONE 5 MG TAB: 5 mg | ORAL | Qty: 2

## 2017-04-26 MED FILL — OXYCODONE-ACETAMINOPHEN 5 MG-325 MG TAB: 5-325 mg | ORAL | Qty: 2

## 2017-04-26 MED FILL — MORPHINE 4 MG/ML SYRINGE: 4 mg/mL | INTRAMUSCULAR | Qty: 1

## 2017-04-26 MED FILL — DEXTROSE 5%-1/2 NORMAL SALINE IV: INTRAVENOUS | Qty: 1000

## 2017-04-26 MED FILL — PANTOPRAZOLE 40 MG IV SOLR: 40 mg | INTRAVENOUS | Qty: 40

## 2017-04-26 MED FILL — ACYCLOVIR 200 MG CAP: 200 mg | ORAL | Qty: 2

## 2017-04-26 MED FILL — SODIUM CHLORIDE 0.9 % IV: INTRAVENOUS | Qty: 1000

## 2017-04-26 NOTE — Discharge Summary (Signed)
Discharge Summary    Patient: [name] Wu Age: 56 y.o. Sex: female    Date of Birth: 06-17-1960 Admit Date: 04/25/2017 PCP: Lacey Jensen, MD   MRN: [id number]  CSN: [id number]       Author: Hilton Cork. [place] Peacock, MD  Service: [facility]  Pager: [phone]  Date: April 26, 2017    Encounter Summary:     Admission Date: 04/25/2017  Discharge Date: April 26, 2017  Length of Stay: 1 Days  Encounter Type: Inpatient  Consultants: Oncology  Procedures: None  Disposition: Home  Condition: Stable  Diet: Low-salt, low fat  Activities: No restrictions  Code Status: Full-Code  Follow-up: Oncologist in [place]  Discharge Time: < 30 minutes.    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  Patient was found to have elevated D-dimer and underwent PE workup.  Had a slightly elevated Troponin I and was not considered AMI. Patient responded to IV fluids and resumption of pain medications. Scheduled to fly to [facility] tomorrow for chemotherapy.    Per Oncology: "The patient is a 56 year old female who is from [place], [place] and was diagnosed to have acute lymphoblastic leukemia.  The patient went to [place] because she has family over there and her diagnosis was confirmed.  She was seen and treated at [facility] in [place].  She was started on Rituxan and Hyper-CVAD, also together with Sprycel.  She has so far received 2 cycles of her chemotherapy.  She is also being given spine intrathecal chemotherapy, which I presume to be methotrexate. The patient has just received her second cycle of chemotherapy.  She is now back here to be with her family and has been getting daily Neupogen.  She received her  7th dose of Neupogen on 04/25/2017.  She came in because of increasing chest pain.  She was found to have a white count of 50,000.  A CT scan of her chest was unremarkable with no evidence of PE.  The patient is now being evaluated to make sure her cardiac status is okay.  She is on telemetry.  She is also being given pain medicine for her bone pains.The patient is now referred for evaluation regarding her ALL and her severe pain which is secondary to her Neupogen."    T98.2, BP 172/85, HR 107  Gen: Alert x 3  Chest: CTA  Cardiac: Rapid rate, regular  GI: Soft, NT/ND, +BS  Ext: No edema    12/14: Na 141, K 3.8, Cl 106, CO2 26, BUN 4, Cr 0.6, Glu 162, AG 9, eGFR >60  12/14: Ca 8.2, Mg 1.9, TB 0.4, ALT 19, AST 37, A0 211, TP 5.5, Alb 3.2  12/14: TrpI 0.282  12/14: WBC 70.2, Hb 8.1, Hct 24.2, Plts 34 (LL), MCV 88.3, N% BAND 15.2, L% 0.9, E% 0.0, Bands 15.2  12/14: PT 12.5, INR 1.1  12/14: U/A: SG 1.020, pH 5.5, Protein 30, Blood Sml, LE -, NI -, WBC 1-4, Bacteria 1+  12/14: Glu 156  12/14: Na 140, K FTNT, Cl 107, CO2 24, BUN 7, Cr 0.7, Glu 156, AG 10, eGFR >60  12/14: Ca 8.3, Mg FTNT, TB 0.5, ALT  18, AST FTNT, A0 160, TP 5.7, Alb 3.1  12/14: Cholesterol 110, LDL 5, HDL 68, Triglycerides 185, Chol/HDL Ratio 1.6  12/14: HbA1c 6.4  12/14: TSH 2.340, Free T4 1.14  12/14: WBC 50.4, Hb 8.1, Hct 24.5, Plts 35 (LL), MCV 89.7, N% BAND 7.5, L% 3.8, Bands 7.5  12/14: WBC 50.4, Hb 8.1, Hct 24.5, Plts 35 (LL), MCV 89.7, N% BAND 7.5, L% 3.8, Bands 7.5  12/14: Lactate 0.8, Procalcitonin 0.88  12/14: TrpI 0.520  12/13: D-Dimer 3.70    2D ECHO COMPLETE WO CONTRAST (04/25/17 16:12)    The left ventricle is normal in size with moderate concentric left ventricular hypertrophy.  Left ventricular systolic function is normal. Ejection fraction is 63%.  Grade I diastolic dysfunction.  Normal right ventricular size and systolic function.  Mild pulmonic valvular regurgitation.  Mildly stenotic gradient noted on aortic valve. Peak gradient is  70mHg, mean 131mg, however valve opens well. Valve area calculated at 3.3cm2  Insufficient Tricuspid regurgitation to evaluate for PASP/ RVSP.    EKG, 12 LEAD (04/24/17 21:12)    Sinus tachycardia  Cannot exclude Septal infarct , age undetermined  Abnormal ECG  No previous ECGs available      DUPLEX LOWER EXT VENOUS BILAT (04/25/17 17:12)    No evidence of acute superficial or deep vein thrombosis noted in either lower extremity.    CTA CHEST W OR W WO CONT (04/25/17 01:12)    No pulmonary embolism.  Mild cardiomegaly.  Bibasilar discoid atelectasis.    XR CHEST PORT (04/24/17 23:12)    Both lungs are clear.  No pleural effusion or pneumothorax is detected.  The cardiac silhouette, mediastinum and hilar regions are unremarkable.    CULTURE, URINE (04/25/17 05:12)    25,000 CFU/mL  Skin and/or genital contamination    Discharge Medications:     Current Discharge Medication List      CONTINUE these medications which have CHANGED    Details   oxyCODONE IR (ROXICODONE) 5 mg immediate release tablet Take 2 Tabs by mouth every four (4) hours as needed. Max Daily Amount: 60 mg.  Qty: 60 Tab, Refills: 0    Associated Diagnoses: Chest pressure         CONTINUE these medications which have NOT CHANGED    Details   acyclovir (ZOVIRAX) 400 mg tablet Take 400 mg by mouth two (2) times a day.      tbo-filgrastim (GRANIX) 480 mcg/0.8 mL syrg injection 480 mcg by SubCUTAneous route once.      insulin lispro (HUMALOG JUNIOR KWIKPEN U-100) 100 unit/mL inph by SubCUTAneous route three (3) times daily.      potassium chloride SR (K-TAB) 20 mEq tablet Take 20 mEq by mouth two (2) times a day.      levoFLOXacin (LEVAQUIN) 500 mg tablet Take 500 mg by mouth daily.      posaconazole (NOXAFIL) 100 mg DR tablet Take 300 mg by mouth daily (with breakfast).      insulin degludec (TRESIBA FLEXTOUCH U-200) 200 unit/mL (3 mL) inpn 20 Units by SubCUTAneous route daily.      zolpidem (AMBIEN) 5 mg tablet Take 5 mg by mouth nightly.              Discharge Instructions:     Call Dr. TaEdd Arbourf have any issues while here locally.    Lab Results (Most Recent):      Results:   Chemistry Recent Labs     04/25/17  1709 04/25/17  0455 04/24/17  1905   HBA1C  --  6.4*  --    GLU 162* 156* 151*   NA 141 140 139   K 3.8 FTNT 3.8   CL 106 107 103   CO2 26 24 26    BUN 4* 7 8   CREA 0.6 0.7 0.85   CA 8.2* 8.3*   7.4* 9.6   AGAP 9 10 10    BUCR  --   --  9*   AP 211* 160* 166*   TP 5.5* 5.7* 6.1*   ALB 3.2* 3.1* 3.3*   GLOB  --   --  2.8   AGRAT  --   --  1.2      CBC w/Diff No results found for this visit on 04/25/17 (from the past 12 hour(s)).   Cardiac Enzymes Lab Results   Component Value Date/Time    CK 91 04/24/2017 07:05 PM    CK - MB 1.9 04/24/2017 07:05 PM    CK-MB Index 2.1 04/24/2017 07:05 PM    Troponin-I 0.282 (H) 04/25/2017 05:09 PM    Troponin-I, Qt. 0.53 (H) 04/24/2017 10:30 PM      Coagulation Lab Results   Component Value Date/Time    INR 1.1 04/25/2017 07:04 AM    Prothrombin time 12.5 04/25/2017 07:04 AM      Lipid Panel Lab Results   Component Value Date/Time    Cholesterol, total 110 (L) 04/25/2017 04:55 AM    HDL Cholesterol 68 04/25/2017 04:55 AM    LDL, calculated 5 04/25/2017 04:55 AM    Triglyceride 185 (H) 04/25/2017 04:55 AM    CHOL/HDL Ratio 1.6 04/25/2017 04:55 AM      BNP No results found for: BNP, BNPP, BNPPPOC, XBNPT, BNPNT   Liver Enzymes Recent Labs     04/25/17  1709   TP 5.5*   ALB 3.2*   AP 211*   SGOT 37      Vitamins No results found for: VITD3, XQVID2, XQVID3, XQVID, VD3RIA    No results found for: B12LT, FOL, RBCF   Thyroid Studies Lab Results   Component Value Date/Time    TSH 2.340 04/25/2017 04:55 AM            Radiology Results (Most Recent):     X-Ray:  Results from [place] encounter on 04/24/17   XR CHEST PORT    Narrative EXAM:  Chest Portable.    INDICATION:  Acute on chronic myalgia.  General abdominal pain.  Chemotherapy.     COMPARISON:  None.     TECHNIQUE:  Portable AP chest study obtained in upright  position.    FINDINGS:      - Both lungs are clear.   - No pleural effusion or pneumothorax is detected.   - The cardiac silhouette, mediastinum and hilar regions are unremarkable.       Impression IMPRESSION:    Negative study.          Ultrasound:  No results found for this or any previous visit.    Vascular / Ultrasound:  Results from [place] encounter on 04/25/17   DUPLEX LOWER EXT VENOUS BILAT    Narrative  Study ID: [facility]                                            [address] [place],                                         [place]                                  Lower Extremity Venous Report    Name: [name] Wu, [name] Wu            Study Date: 04/25/2017 08:46 AM  MRN: [id number]                     Patient Location: CD06  DOB: 1960-12-16                  Age: 23 yrs  Gender: Female                   Account #: [id number]  Reason For Study: Elevated D-dimer  Ordering Physician: Meredeth Ide  Performed By: Wannetta Sender    Interpretation Summary  No evidence of acute superficial or deep vein thrombosis noted in either  lower extremity .    _____________________________________________________________________________  Verneda Skill  Complete bilateral venous duplex performed. ICD 10: R78.87.    HISTORY/SYMPTOMS  Cancer. Elevated d-dimer.    RIGHT LEG  The right common femoral, femoral, popliteal, posterior tibial and peroneal  veins  were examined with duplex ultrasound. The deep veins were patent and  compressible with no evidence of intraluminal thrombus. Spontaneous, phasic  venous flow with  normal augmentation was noted throughout the right leg.  RIGHT SAPHENOUS VEINS  Compression of the right proximal greater saphenous vein is complete.  Spectral Doppler exam demonstrates normal and spontaneous venous flow in the  right proximal greater saphenous vein.    LEFT LEG  The left common femoral, femoral, popliteal, posterior tibial and peroneal  veins were examined with duplex ultrasound. The deep veins were patent and  compressible with no evidence of intraluminal thrombus. Spontaneous, phasic  venous flow with normal augmentation was noted throughout the left leg.    LEFT LEG SAPHENOUS VEINS  Compression of the left proximal greater saphenous vein is complete. Spectral  Doppler exam demonstrates normal and spontaneous venous flow in the left  proximal greater saphenous vein.      Electronically signed byDr. Judithe Modest, M.D   04/25/2017 05:31 PM         CT:  Results from Hospital Encounter encounter on 04/25/17   CTA CHEST W OR W WO CONT    Narrative Indication: Dyspnea. Leukemia. Diabetes. Hypertension. Chemotherapy. Elevated  troponin and chest pressure.      Impression IMPRESSION: No pulmonary embolism. Mild cardiomegaly. Bibasilar discoid  atelectasis.    Comment: 3-D CTA of the chest was performed following administration of  intravenous contrast. Multiplanar PE protocol and MIPS projections were  obtained. The initial report was rendered by [facility] radiology services.    The heart is mildly enlarged. There is no pulmonary embolism or aortic  dissection. The aorta is of normal caliber.    No pleural or pericardial effusion.    There is dependent subsegmental atelectasis at both lung bases. No pulmonary  consolidation or mass.    No adenopathy.         MRI:  No results found for this or any previous visit.    Nuclear Medicine:  No results found for this or any previous visit.    Interventional Radiology:  No results found for this or any previous visit.

## 2017-04-26 NOTE — Consults (Signed)
[facility] REPORT  NAME:  [name] Wu, [name] Wu  SEX:   F  ADMIT: 04/25/2017  DATE OF CONSULT: 04/26/2017  REFERRING PHYSICIAN:    DOB: 20-Apr-1961  MR#    [id number]  ROOM:  CD06  ACCT#  [id number]        HISTORY OF PRESENT ILLNESS:  The patient is a 56 year old female who is from [place], [place] and was diagnosed to have acute lymphoblastic leukemia.  The patient went to [place] because she has family over there and her diagnosis was confirmed.  She was seen and treated at [facility] in [place].  She was started on Rituxan and Hyper-CVAD, also together with Sprycel.  She has so far received 2 cycles of her chemotherapy.  She is also being given spine intrathecal chemotherapy, which I presume to be methotrexate.    The patient has just received her second cycle of chemotherapy.  She is now back here to be with her family and has been getting daily Neupogen.  She received her 7th dose of Neupogen on 04/25/2017.  She came in because of increasing chest pain.  She was found to have a white count of 50,000.  A CT scan of her chest was unremarkable with no evidence of PE.  The patient is now being evaluated to make sure her cardiac status is okay.  She is on telemetry.  She is also being given pain medicine for her bone pains.    The patient is now referred for evaluation regarding her ALL and her severe pain which is secondary to her Neupogen.    REVIEW OF SYSTEMS:  The patient has no fever, chills or night sweats.  She has fatigue, decreased appetite, has some headaches.  Had some mouth sore, which is now better.  No dizziness, no diplopia.  Had some chest pain and rib pain.  No hemoptysis, pleurisy or wheezing.  Had some palpitations.  No angina, no syncope, no orthopnea.  Had some nausea and vomiting, had some abdominal pain and reflux.  No rectal bleeding, melena or hematemesis.  No dysuria or hematuria, no incontinence.  No delusion or hallucination, no seizure.  Has diffuse bone aches  and pains.    PAST MEDICAL HISTORY:  Diabetes, hypertension and ALL.    SOCIAL HISTORY:  The patient lives with her family.  She is a nonsmoker, no alcohol abuse.    FAMILY HISTORY:  Mother had breast cancer.    CURRENT MEDICATIONS:  Oxycodone, Protonix, acyclovir and her Sprycel is currently on hold.    PHYSICAL EXAMINATION:  GENERAL APPEARANCE:  A middle-aged female who looks uncomfortable and in pain, not in respiratory distress.  HEENT:  PERRLA, anicteric sclerae, no oral thrush, no mucositis.  NECK:  No palpable cervical or supraclavicular lymph nodes.  CHEST AND LUNGS:  Clear breath sounds.  No rales, no wheezing.  HEART:  S1, S2, no murmur.  ABDOMEN:  Soft, nontender, no hepatosplenomegaly.  EXTREMITIES:  No pedal edema.  NEUROLOGIC:  No focal deficits.    PERTINENT LABORATORY DATA:  WBC 17, hemoglobin 8, hematocrit 24, platelet count of 34,000 with 69% neutrophils and 15% bands.  Has 6% metamyelocytes and 2% myelocytes.    Sodium 141, potassium 3.8, BUN 4, creatinine 0.6, ALT 19, AST 37, alkaline phosphatase 211.    Venous Doppler of bilateral lower extremity was negative.    A 2-D echocardiogram showed normal ejection fraction of 63%.    Troponin is mildly elevated to 2.28.    ASSESSMENT:  A 57 year old female with acute lymphoblastic leukemia and currently has leukocytosis secondary to her Neupogen.  She also has significant bone pains due to her Neupogen.    PLAN:  1.  Will continue giving her pain medication.    2.  Add Claritin to help with her bone pain.    3.  Give her IV fluid hydration to help.    4.  Continue symptomatic treatment for her nausea, vomiting, dehydration and bone pains.  5.  The patient will fly back to [facility] to resume her treatment once she is feeling better for her next cycle of chemotherapy.  6.  Continue cardiac workup and monitor as per hospitalist.    Thank you for requesting consultation on this patient.      ___________________  [name] M.D.  Dictated By:.    SC  D:04/26/2017 09:14:42  T: 04/26/2017 10:27:01  [id number]

## 2017-04-26 NOTE — Progress Notes (Signed)
PAGER ID: [phone]   MESSAGE: [phone] Howeth, Jazmarie(chest pressure/high trop) pt c/o upset stomach/feeling like want to throw up. Could we have Zofran order prn please. [phone]. Thanks. Kenney Houseman RN

## 2017-04-26 NOTE — Discharge Summary (Signed)
Discharge Summary  Patient: [name] Wu Age: 56 y.o. Sex: female    Date of Birth: 1961-02-07 Admit Date: 04/25/2017 PCP: Lacey Jensen, MD   MRN: [id number]  CSN: [id number]       Author: Hilton Cork. Jemila Camille, MD  Service: [facility]  Pager: [phone]  Date: April 26, 2017    Encounter Summary:     Admission Date: 04/25/2017  Discharge Date: April 26, 2017  Length of Stay: 1 Days  Encounter Type: Inpatient  Consultants: Oncology  Procedures: None  Disposition: Home  Condition: Stable  Diet: Low-salt, low fat  Activities: No restrictions  Code Status: Full-Code  Follow-up: Oncologist in [place]  Discharge Time: < 30 minutes.    Discharge Diagnoses:     Acute lymphoblastic leukemia on Chemotherapy  Generalized Myalgias  Elevated D Dimer   Elevated Troponin, suspect demand ischemia r/o NSTEMI  Thrombocytopenia     Hospital Course:   Patient is a 56 year old with AML on chemotherapy (see below) who presented with myalgias and bone pain after starting neupogen and running out of oxycodone.  Patient was found to have elevated D-dimer and underwent PE workup.  Had a slightly elevated Troponin I and was not considered AMI. Patient responded to IV fluids and resumption of pain medications. Scheduled to fly to [facility] tomorrow for chemotherapy.    Per Oncology: "The patient is a 56 year old female who is from [place], [place] and was diagnosed to have acute lymphoblastic leukemia.  The patient went to [place] because she has family over there and her diagnosis was confirmed.  She was seen and treated at [facility] in [place].  She was started on Rituxan and Hyper-CVAD, also together with Sprycel.  She has so far received 2 cycles of her chemotherapy.  She is also being given spine intrathecal chemotherapy, which I presume to be methotrexate. The patient has just received her second cycle of chemotherapy.  She is now back here to be with her family and has been getting daily Neupogen.   She received her 7th dose of Neupogen on 04/25/2017.  She came in because of increasing chest pain.  She was found to have a white count of 50,000.  A CT scan of her chest was unremarkable with no evidence of PE.  The patient is now being evaluated to make sure her cardiac status is okay.  She is on telemetry.  She is also being given pain medicine for her bone pains.The patient is now referred for evaluation regarding her ALL and her severe pain which is secondary to her Neupogen."    T98.2, BP 172/85, HR 107  Gen: Alert x 3  Chest: CTA  Cardiac: Rapid rate, regular  GI: Soft, NT/ND, +BS  Ext: No edema    12/14: Na 141, K 3.8, Cl 106, CO2 26, BUN 4, Cr 0.6, Glu 162, AG 9, eGFR >60  12/14: Ca 8.2, Mg 1.9, TB 0.4, ALT 19, AST 37, A0 211, TP 5.5, Alb 3.2  12/14: TrpI 0.282  12/14: WBC 70.2, Hb 8.1, Hct 24.2, Plts 34 (LL), MCV 88.3, N% BAND 15.2, L% 0.9, E% 0.0, Bands 15.2  12/14: PT 12.5, INR 1.1  12/14: U/A: SG 1.020, pH 5.5, Protein 30, Blood Sml, LE -, NI -, WBC 1-4, Bacteria 1+  12/14: Glu 156  12/14: Na 140, K FTNT, Cl 107, CO2 24, BUN 7, Cr 0.7, Glu 156, AG 10, eGFR >60  12/14: Ca 8.3, Mg FTNT, TB 0.5, ALT 18, AST FTNT, A0  160, TP 5.7, Alb 3.1  12/14: Cholesterol 110, LDL 5, HDL 68, Triglycerides 185, Chol/HDL Ratio 1.6  12/14: HbA1c 6.4  12/14: TSH 2.340, Free T4 1.14  12/14: WBC 50.4, Hb 8.1, Hct 24.5, Plts 35 (LL), MCV 89.7, N% BAND 7.5, L% 3.8, Bands 7.5  12/14: WBC 50.4, Hb 8.1, Hct 24.5, Plts 35 (LL), MCV 89.7, N% BAND 7.5, L% 3.8, Bands 7.5  12/14: Lactate 0.8, Procalcitonin 0.88  12/14: TrpI 0.520  12/13: D-Dimer 3.70    2D ECHO COMPLETE WO CONTRAST (04/25/17 16:12)    The left ventricle is normal in size with moderate concentric left ventricular hypertrophy.  Left ventricular systolic function is normal. Ejection fraction is 63%.  Grade I diastolic dysfunction.  Normal right ventricular size and systolic function.  Mild pulmonic valvular regurgitation.   Mildly stenotic gradient noted on aortic valve. Peak gradient is 34mHg, mean 129mg, however valve opens well. Valve area calculated at 3.3cm2  Insufficient Tricuspid regurgitation to evaluate for PASP/ RVSP.    EKG, 12 LEAD (04/24/17 21:12)    Sinus tachycardia  Cannot exclude Septal infarct , age undetermined  Abnormal ECG  No previous ECGs available      DUPLEX LOWER EXT VENOUS BILAT (04/25/17 17:12)    No evidence of acute superficial or deep vein thrombosis noted in either lower extremity.    CTA CHEST W OR W WO CONT (04/25/17 01:12)    No pulmonary embolism.  Mild cardiomegaly.  Bibasilar discoid atelectasis.    XR CHEST PORT (04/24/17 23:12)    Both lungs are clear.  No pleural effusion or pneumothorax is detected.  The cardiac silhouette, mediastinum and hilar regions are unremarkable.    CULTURE, URINE (04/25/17 05:12)    25,000 CFU/mL  Skin and/or genital contamination    Discharge Medications:     Current Discharge Medication List      CONTINUE these medications which have CHANGED    Details   oxyCODONE IR (ROXICODONE) 5 mg immediate release tablet Take 2 Tabs by mouth every four (4) hours as needed. Max Daily Amount: 60 mg.  Qty: 60 Tab, Refills: 0    Associated Diagnoses: Chest pressure         CONTINUE these medications which have NOT CHANGED    Details   acyclovir (ZOVIRAX) 400 mg tablet Take 400 mg by mouth two (2) times a day.      tbo-filgrastim (GRANIX) 480 mcg/0.8 mL syrg injection 480 mcg by SubCUTAneous route once.      insulin lispro (HUMALOG JUNIOR KWIKPEN U-100) 100 unit/mL inph by SubCUTAneous route three (3) times daily.      potassium chloride SR (K-TAB) 20 mEq tablet Take 20 mEq by mouth two (2) times a day.      levoFLOXacin (LEVAQUIN) 500 mg tablet Take 500 mg by mouth daily.      posaconazole (NOXAFIL) 100 mg DR tablet Take 300 mg by mouth daily (with breakfast).      insulin degludec (TRESIBA FLEXTOUCH U-200) 200 unit/mL (3 mL) inpn 20 Units by SubCUTAneous route daily.       zolpidem (AMBIEN) 5 mg tablet Take 5 mg by mouth nightly.             Discharge Instructions:     Call Dr. TaEdd Arbourf have any issues while here locally.    Lab Results (Most Recent):      Results:   Chemistry Recent Labs     04/25/17  1709 04/25/17  0455 04/24/17  1905  HBA1C  --  6.4*  --    GLU 162* 156* 151*   NA 141 140 139   K 3.8 FTNT 3.8   CL 106 107 103   CO2 '26 24 26   '$ BUN 4* 7 8   CREA 0.6 0.7 0.85   CA 8.2* 8.3*   7.4* 9.6   AGAP '9 10 10   '$ BUCR  --   --  9*   AP 211* 160* 166*   TP 5.5* 5.7* 6.1*   ALB 3.2* 3.1* 3.3*   GLOB  --   --  2.8   AGRAT  --   --  1.2      CBC w/Diff No results found for this visit on 04/25/17 (from the past 12 hour(s)).   Cardiac Enzymes Lab Results   Component Value Date/Time    CK 91 04/24/2017 07:05 PM    CK - MB 1.9 04/24/2017 07:05 PM    CK-MB Index 2.1 04/24/2017 07:05 PM    Troponin-I 0.282 (H) 04/25/2017 05:09 PM    Troponin-I, Qt. 0.53 (H) 04/24/2017 10:30 PM      Coagulation Lab Results   Component Value Date/Time    INR 1.1 04/25/2017 07:04 AM    Prothrombin time 12.5 04/25/2017 07:04 AM      Lipid Panel Lab Results   Component Value Date/Time    Cholesterol, total 110 (L) 04/25/2017 04:55 AM    HDL Cholesterol 68 04/25/2017 04:55 AM    LDL, calculated 5 04/25/2017 04:55 AM    Triglyceride 185 (H) 04/25/2017 04:55 AM    CHOL/HDL Ratio 1.6 04/25/2017 04:55 AM      BNP No results found for: BNP, BNPP, BNPPPOC, XBNPT, BNPNT   Liver Enzymes Recent Labs     04/25/17  1709   TP 5.5*   ALB 3.2*   AP 211*   SGOT 37      Vitamins No results found for: VITD3, XQVID2, XQVID3, XQVID, VD3RIA    No results found for: B12LT, FOL, RBCF   Thyroid Studies Lab Results   Component Value Date/Time    TSH 2.340 04/25/2017 04:55 AM            Radiology Results (Most Recent):     X-Ray:  Results from [facility] encounter on 04/24/17   XR CHEST PORT    Narrative EXAM:  Chest Portable.    INDICATION:  Acute on chronic myalgia.  General abdominal pain.  Chemotherapy.      COMPARISON:  None.     TECHNIQUE:  Portable AP chest study obtained in upright position.    FINDINGS:      - Both lungs are clear.   - No pleural effusion or pneumothorax is detected.   - The cardiac silhouette, mediastinum and hilar regions are unremarkable.       Impression IMPRESSION:    Negative study.          Ultrasound:  No results found for this or any previous visit.    Vascular / Ultrasound:  Results from [facility] encounter on 04/25/17   DUPLEX LOWER EXT VENOUS BILAT    Narrative  Study ID: [facility]                                            [address] [place],                                         [place]                                  Lower Extremity Venous Report    Name: MARTIZA, SPETH            Study Date: 04/25/2017 08:46 AM  MRN: [id number]                     Patient Location: CD06  DOB: 1960-12-16                  Age: 23 yrs  Gender: Female                   Account #: [id number]  Reason For Study: Elevated D-dimer  Ordering Physician: Meredeth Ide  Performed By: Wannetta Sender    Interpretation Summary  No evidence of acute superficial or deep vein thrombosis noted in either  lower extremity .    _____________________________________________________________________________  Verneda Skill  Complete bilateral venous duplex performed. ICD 10: R78.87.    HISTORY/SYMPTOMS  Cancer. Elevated d-dimer.    RIGHT LEG  The right common femoral, femoral, popliteal, posterior tibial and peroneal  veins  were examined with duplex ultrasound. The deep veins were patent and   compressible with no evidence of intraluminal thrombus. Spontaneous, phasic  venous flow with normal augmentation was noted throughout the right leg.  RIGHT SAPHENOUS VEINS  Compression of the right proximal greater saphenous vein is complete.  Spectral Doppler exam demonstrates normal and spontaneous venous flow in the  right proximal greater saphenous vein.    LEFT LEG  The left common femoral, femoral, popliteal, posterior tibial and peroneal  veins were examined with duplex ultrasound. The deep veins were patent and  compressible with no evidence of intraluminal thrombus. Spontaneous, phasic  venous flow with normal augmentation was noted throughout the left leg.    LEFT LEG SAPHENOUS VEINS  Compression of the left proximal greater saphenous vein is complete. Spectral  Doppler exam demonstrates normal and spontaneous venous flow in the left  proximal greater saphenous vein.      Electronically signed byDr. Judithe Modest, M.D   04/25/2017 05:31 PM         CT:  Results from Hospital Encounter encounter on 04/25/17   CTA CHEST W OR W WO CONT    Narrative Indication: Dyspnea. Leukemia. Diabetes. Hypertension. Chemotherapy. Elevated  troponin and chest pressure.      Impression IMPRESSION: No pulmonary embolism. Mild cardiomegaly. Bibasilar discoid  atelectasis.    Comment: 3-D CTA of the chest was performed following administration of  intravenous contrast. Multiplanar PE protocol and MIPS projections were  obtained. The initial report was rendered by [facility] radiology services.    The heart is mildly enlarged. There is no pulmonary embolism or aortic  dissection. The aorta is of normal caliber.    No pleural or pericardial effusion.    There is dependent subsegmental atelectasis at both lung bases. No pulmonary  consolidation or mass.    No adenopathy.         MRI:  No results found for this or any previous visit.    Nuclear Medicine:  No results found for this or any previous visit.    Interventional Radiology:   No results found for this or any previous visit.

## 2017-04-26 NOTE — Progress Notes (Signed)
Problem: Falls - Risk of  Goal: *Absence of Falls  Document Schmid Fall Risk and appropriate interventions in the flowsheet.  Outcome: Progressing Towards Goal  Fall Risk Interventions:            Medication Interventions: Evaluate medications/consider consulting pharmacy, Patient to call before getting OOB, Teach patient to arise slowly

## 2017-04-26 NOTE — Consults (Signed)
[facility] REPORT  NAME:  [name] Wu, [name] Wu  SEX:   F  ADMIT: 04/25/2017  DATE OF CONSULT: 04/26/2017  REFERRING PHYSICIAN:    DOB: 1961/01/04  MR#    [id number]  ROOM:  CD06  ACCT#  [id number]        HISTORY OF PRESENT ILLNESS:  The patient is a 56 year old female who is from [place], [place] and was diagnosed to have acute lymphoblastic leukemia.  The patient went to [place] because she has family over there and her diagnosis was confirmed.  She was seen and treated at [facility] in [place].  She was started on Rituxan and Hyper-CVAD, also together with Sprycel.  She has so far received 2 cycles of her chemotherapy.  She is also being given spine intrathecal chemotherapy, which I presume to be methotrexate.    The patient has just received her second cycle of chemotherapy.  She is now back here to be with her family and has been getting daily Neupogen.  She received her 7th dose of Neupogen on 04/25/2017.  She came in because of increasing chest pain.  She was found to have a white count of 50,000.  A CT scan of her chest was unremarkable with no evidence of PE.  The patient is now being evaluated to make sure her cardiac status is okay.  She is on telemetry.  She is also being given pain medicine for her bone pains.    The patient is now referred for evaluation regarding her ALL and her severe pain which is secondary to her Neupogen.    REVIEW OF SYSTEMS:  The patient has no fever, chills or night sweats.  She has fatigue, decreased appetite, has some headaches.  Had some mouth sore, which is now better.  No dizziness, no diplopia.  Had some chest pain and rib pain.  No hemoptysis, pleurisy or wheezing.  Had some palpitations.  No angina, no syncope, no orthopnea.  Had some nausea and vomiting, had some abdominal pain and reflux.  No rectal bleeding, melena or hematemesis.  No dysuria or hematuria, no incontinence.  No delusion or hallucination, no seizure.   Has diffuse bone aches and pains.    PAST MEDICAL HISTORY:  Diabetes, hypertension and ALL.    SOCIAL HISTORY:  The patient lives with her family.  She is a nonsmoker, no alcohol abuse.    FAMILY HISTORY:  Mother had breast cancer.    CURRENT MEDICATIONS:  Oxycodone, Protonix, acyclovir and her Sprycel is currently on hold.    PHYSICAL EXAMINATION:  GENERAL APPEARANCE:  A middle-aged female who looks uncomfortable and in pain, not in respiratory distress.  HEENT:  PERRLA, anicteric sclerae, no oral thrush, no mucositis.  NECK:  No palpable cervical or supraclavicular lymph nodes.  CHEST AND LUNGS:  Clear breath sounds.  No rales, no wheezing.  HEART:  S1, S2, no murmur.  ABDOMEN:  Soft, nontender, no hepatosplenomegaly.  EXTREMITIES:  No pedal edema.  NEUROLOGIC:  No focal deficits.    PERTINENT LABORATORY DATA:  WBC 17, hemoglobin 8, hematocrit 24, platelet count of 34,000 with 69% neutrophils and 15% bands.  Has 6% metamyelocytes and 2% myelocytes.    Sodium 141, potassium 3.8, BUN 4, creatinine 0.6, ALT 19, AST 37, alkaline phosphatase 211.    Venous Doppler of bilateral lower extremity was negative.    A 2-D echocardiogram showed normal ejection fraction of 63%.    Troponin is mildly elevated to 2.28.    ASSESSMENT:  A 56 year old female with acute lymphoblastic leukemia and currently has leukocytosis secondary to her Neupogen.  She also has significant bone pains due to her Neupogen.    PLAN:  1.  Will continue giving her pain medication.    2.  Add Claritin to help with her bone pain.    3.  Give her IV fluid hydration to help.    4.  Continue symptomatic treatment for her nausea, vomiting, dehydration and bone pains.  5.  The patient will fly back to [facility] to resume her treatment once she is feeling better for her next cycle of chemotherapy.  6.  Continue cardiac workup and monitor as per hospitalist.    Thank you for requesting consultation on this patient.      ___________________  [name] M.D.   Dictated By:.   SC  D:04/26/2017 09:14:42  T: 04/26/2017 10:27:01  [id number]

## 2017-04-26 NOTE — Progress Notes (Signed)
CM attempted to discharge plan interview with patient but she requested for CM to come back due to she was in pain and just started on fluids. Patient did inform she lives with family.

## 2017-04-26 NOTE — Progress Notes (Signed)
NUTRITION RECOMMENDATIONS:   Med CCHO diet (4 portions - medium)  Glucerna shake BID with breakfast/lunch (220 kcals, 10 gm protein each)   Magic Cup once daily (290 kcals, 9 gm protein each)     NUTRITION INITIAL EVALUATION    NUTRITION ASSESSMENT:       Reason for assessment: MST    Admitting diagnosis: Elevated troponin  Chest pressure      PMH:   Past Medical History:   Diagnosis Date   ??? Diabetes ([place])    ??? Hx antineoplastic chemotherapy    ??? Hypertension    ??? Leukemia ([place])        Code Status: Full Code      Anthropometrics:Height:   Ht Readings from Last 3 Encounters:   04/25/17 5' (1.524 m)   04/24/17 5' (1.524 m)       Weight:   Wt Readings from Last 3 Encounters:   04/26/17 83 kg (182 lb 15.7 oz)   04/24/17 77.1 kg (170 lb)       ?? IBW: 46 kg      ?? % IBW: 180%    ?? BMI: Body mass index is 35.74 kg/m??.    ?? UBW: unknown     ?? Wt change: pt reports she thinks she has lost weight recently, but could not quantify how much (perhaps would meet ASPEN criteria for malnutrition; however, pt is unsure of extent of wt loss at this time)          []  significant       []  not significant         []  intended         []  not intended Diet and intake history:?? Current diet order: DIET REGULAR    ?? Food allergies: none noted at this time, wants to continue with regular diet; pt notes prior hx about 2-3 years ago of doing elimination diet for about 6 months, reports hives rxn after consuming wheat/gluten/soy/milk, had been avoiding some of these foods more recently 2/2 prior rxn; however, pt currently wants to continue w/ reg diet, no restrictions     ?? Diet/intake history: po intake very poor over past 1 week 2/2 mouth sores and muscle soreness/weakness; had chemo couple of weeks ago. Prior to a  Week ago, pt's po intake seemed more adequate, was eating 2-3 meals per day, feels she would benefit from additional supplement            [x]  <50% intake x >5 days        []  <50% intake x >1 month         []  <75% intake x >7 days         []  <75% intake x 1 month         []  <75% intake x 3 months    ?? Current appetite/PO intake:         [x]   Poor - pt reports "not ready" to eat yet, in a lot of pain          []   fair        []   good        []   N/A- NPO    ?? Assessment of current MNT: Current diet appropriate for patient, adequate to meet nutrition goals if po intake improves to 75% or more with meals; will add high calorie/protein supplements with meals for additional intake opportunity ([employer] [place] and [employer] per request)     ??  Cultural, religious, and ethnic food preferences identified: none     Physical Assessment:GI symptoms: mouth sores x 1 week; abd soft, active BS; LBM 12/10 ; c/o nausea since admission, vomited yesterday after drinking liquid    ?? Chewing/swallowing issues: none noted     ?? Skin integrity: intact     ?? Muscle wasting:  [x]  none  []  mild:  []  moderate:  []  severe:    ?? Fat wasting:  [x]  none  []  mild:  []  moderate:  []  severe:    ?? Fluid accumulation: none    ?? Mental status: alert/oriented  Intake and output:    Intake/Output Summary (Last 24 hours) at 04/26/2017 1132  Last data filed at 04/25/2017 1643  Gross per 24 hour   Intake 240 ml   Output ???   Net 240 ml     Estimated daily nutrition intake needs:  ?? 1250 - 1600 kcals (27-35 kcals/kg IBW)    ?? 85 - 100 g protein (1-1.2 gm/kg ABW)    ?? 2100-2500 ml fluid (25-30 ml/kg ABW)     Living situation: []  alone, little/no support    [x]  alone, family nearby/supportive      []  with family/caregiver     []  in NH/SNF     []  homeless    Current pertinent medications: acyclovir, protonix, D5/0.45% NaCl @ 75 ml/hr     Pertinent labs: 12/14: Glu 162-210 (no insulin regimen ordered at this time, hx DM), A1C 6.4    Does patient meet ASPEN/AND criteria for malnutrition diagnosis: insufficient evidence to meet ASPEN criteria at this time - can update or dx more properly if further evidence of quantity of wt loss found/gathered      NUTRITION DIAGNOSIS:     1. 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    2. 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      NUTRITION INTERVENTION:     Recommended diet:   Med CCHO diet (4 portions - medium)  Glucerna shake BID with breakfast/lunch (220 kcals, 10 gm protein each)   Magic Cup once daily (290 kcals, 9 gm protein each)    Recommended nutrition supplement: Glucerna shake (provides 220 kcals, 10 g protein each)  Magic Cup (provides 290 kcals, 9 gm protein each)    NUTRITION MONITORING AND EVALUATION:     Nutrition level of care:  []  low       [x]  moderate      []  high    Nutrition monitoring: PO intake, diet tolerance and compliance, wt, BS, CMP, hydration, medical changes    Nutrition goals: PO intake >75% with  Meals/supplements, wt stable through LOS, nutrition related lab values WNL, BS 140-180    Murlean Hark, RD  04/26/17

## 2017-04-26 NOTE — Other (Signed)
Pt SBP running 170's-180's MD pages and made aware. PT currently not on any BP meds.

## 2017-04-26 NOTE — Other (Addendum)
----------  DocumentID: [id number]------------------------------------------------              [facility]                       Patient Education Report         Name: IKESHA, SILLER                  Date: 04/25/2017    MRN: [id number]                    Time: 1:45:39 PM         Patient ordered video: 'Patient Safety: Stay Safe While you are in the Hospital'    from [id number] via phone number: [phone] at 1:45:39 PM    Description: This program outlines some of the precautions patients can take to ensure a speedy recovery without extra complications. The video emphasizes the importance of communicating with the healthcare team.    ----------DocumentID: [id number]------------------------------------------------                       [facility]          Patient Education Report - Discharge Summary        Date: 04/26/2017   Time: 5:51:27 PM   Name: CLEMENTINE, SOULLIERE   MRN: [id number]      Account Number: [id number]      Education History:        Patient ordered video: 'Patient Safety: Stay Safe While you are in the Hospital' from [id number] on 04/25/2017 01:45:39 PM

## 2017-05-13 ENCOUNTER — Emergency Department: Admit: 2017-05-13 | Payer: BLUE CROSS/BLUE SHIELD | Primary: Internal Medicine

## 2017-05-13 ENCOUNTER — Inpatient Hospital Stay
Admit: 2017-05-13 | Discharge: 2017-05-15 | Disposition: A | Discharge status: Home / Self Care | Payer: BLUE CROSS/BLUE SHIELD | Attending: Internal Medicine | Admitting: Internal Medicine

## 2017-05-13 DIAGNOSIS — A419 Sepsis, unspecified organism: Principal | ICD-10-CM

## 2017-05-13 LAB — CBC WITH AUTOMATED DIFF
HCT: 16.1 % — CL (ref 37.0–50.0)
HGB: 5.6 gm/dl — CL (ref 13.0–17.2)
LYMPHOCYTES: 90.9 % — ABNORMAL HIGH (ref 28–48)
MCH: 30.3 pg (ref 25.4–34.6)
MCHC: 34.8 gm/dl (ref 30.0–36.0)
MCV: 87 fL (ref 80.0–98.0)
NEUTROPHILS: 9.1 % — ABNORMAL LOW (ref 34–64)
PLATELET COMMENTS: DECREASED
PLATELET: 1 10*3/uL — CL (ref 140–450)
RBC Morphology: NORMAL
RBC: 1.85 M/uL — ABNORMAL LOW (ref 3.60–5.20)
RDW-SD: 50.4 — ABNORMAL HIGH (ref 36.4–46.3)
WBC: 0.1 10*3/uL — CL (ref 4.0–11.0)

## 2017-05-13 LAB — URINALYSIS W/ RFLX MICROSCOPIC
Bilirubin: NEGATIVE
Glucose: NEGATIVE mg/dl
Ketone: NEGATIVE mg/dl
Nitrites: POSITIVE — AB
Protein: >=300 mg/dl — CR
Specific gravity: 1.01 (ref 1.005–1.030)
Urobilinogen: 1 mg/dl (ref 0.0–1.0)
pH (UA): 7.5 (ref 5.0–9.0)

## 2017-05-13 LAB — METABOLIC PANEL, BASIC
Anion gap: 7 mmol/L (ref 5–15)
BUN: 13 mg/dl (ref 7–25)
CO2: 26 mEq/L (ref 21–32)
Calcium: 8.9 mg/dl (ref 8.5–10.1)
Chloride: 108 mEq/L — ABNORMAL HIGH (ref 98–107)
Creatinine: 0.8 mg/dl (ref 0.6–1.3)
GFR est AA: >60
GFR est non-AA: >60
Glucose: 151 mg/dl — ABNORMAL HIGH (ref 74–106)
Potassium: 3.6 mEq/L (ref 3.5–5.1)
Sodium: 141 mEq/L (ref 136–145)

## 2017-05-13 LAB — LACTIC ACID: Lactic Acid: 1.1 mmol/L (ref 0.4–2.0)

## 2017-05-13 LAB — PROTHROMBIN TIME + INR
INR: 1.2 — ABNORMAL HIGH (ref 0.1–1.1)
Prothrombin time: 14.1 seconds — ABNORMAL HIGH (ref 10.2–12.9)

## 2017-05-13 LAB — POC URINE MICROSCOPIC: RBC: >50 /HPF

## 2017-05-13 LAB — INFLUENZA A/B, BY PCR
Influenza A PCR: NEGATIVE
Influenza B PCR: NEGATIVE

## 2017-05-13 LAB — ANTIBODY SCREEN: Antibody screen: NEGATIVE

## 2017-05-13 LAB — BLOOD TYPE, (ABO+RH)
ABO/Rh(D): A POS
ABO/Rh: A POS

## 2017-05-13 LAB — PROCALCITONIN: PROCALCITONIN: 0.63 ng/ml — ABNORMAL HIGH (ref 0.00–0.50)

## 2017-05-13 LAB — GLUCOSE, POC: Glucose (POC): 96 mg/dL (ref 65–105)

## 2017-05-13 MED ORDER — DIPHENHYDRAMINE HCL 50 MG/ML IJ SOLN
50 mg/mL | Freq: Once | INTRAMUSCULAR | Status: AC
Start: 2017-05-13 — End: 2017-05-13
  Administered 2017-05-13: 22:00:00 via INTRAVENOUS

## 2017-05-13 MED ORDER — ZOLPIDEM 5 MG TAB
5 mg | Freq: Every evening | ORAL | Status: DC
Start: 2017-05-13 — End: 2017-05-15
  Administered 2017-05-14 – 2017-05-15 (×2): via ORAL

## 2017-05-13 MED ORDER — ACYCLOVIR 200 MG CAP
200 mg | Freq: Two times a day (BID) | ORAL | Status: DC
Start: 2017-05-13 — End: 2017-05-15
  Administered 2017-05-14 – 2017-05-15 (×4): via ORAL

## 2017-05-13 MED ORDER — INSULIN LISPRO 100 UNIT/ML INJECTION
100 unit/mL | SUBCUTANEOUS | Status: DC | PRN
Start: 2017-05-13 — End: 2017-05-15

## 2017-05-13 MED ORDER — INSULIN GLARGINE 100 UNIT/ML INJECTION
100 unit/mL | Freq: Every evening | SUBCUTANEOUS | Status: DC
Start: 2017-05-13 — End: 2017-05-15
  Administered 2017-05-14 – 2017-05-15 (×2): via SUBCUTANEOUS

## 2017-05-13 MED ORDER — SODIUM CHLORIDE 0.9 % IV
INTRAVENOUS | Status: DC
Start: 2017-05-13 — End: 2017-05-15
  Administered 2017-05-13 – 2017-05-15 (×3): via INTRAVENOUS

## 2017-05-13 MED ORDER — PANTOPRAZOLE 40 MG IV SOLR
40 mg | INTRAVENOUS | Status: AC
Start: 2017-05-13 — End: 2017-05-13
  Administered 2017-05-13: 14:00:00 via INTRAVENOUS

## 2017-05-13 MED ORDER — PANTOPRAZOLE 40 MG IV SOLR
40 mg | INTRAVENOUS | Status: DC
Start: 2017-05-13 — End: 2017-05-13
  Administered 2017-05-13: 15:00:00 via INTRAVENOUS

## 2017-05-13 MED ORDER — ACETAMINOPHEN (TYLENOL) SOLUTION 32MG/ML
ORAL | Status: DC | PRN
Start: 2017-05-13 — End: 2017-05-15

## 2017-05-13 MED ORDER — PANTOPRAZOLE 40 MG IV SOLR
40 mg | Freq: Every day | INTRAVENOUS | Status: DC
Start: 2017-05-13 — End: 2017-05-13

## 2017-05-13 MED ORDER — SODIUM CHLORIDE 0.9 % INJECTION
40 mg | Freq: Two times a day (BID) | INTRAMUSCULAR | Status: DC
Start: 2017-05-13 — End: 2017-05-15
  Administered 2017-05-14 – 2017-05-15 (×4): via INTRAVENOUS

## 2017-05-13 MED ORDER — CEFEPIME 2 GRAM SOLUTION FOR INJECTION
2 gram | Freq: Three times a day (TID) | INTRAMUSCULAR | Status: DC
Start: 2017-05-13 — End: 2017-05-15
  Administered 2017-05-13 – 2017-05-15 (×7): via INTRAVENOUS

## 2017-05-13 MED ORDER — SODIUM CHLORIDE 0.9 % IJ SYRG
Freq: Three times a day (TID) | INTRAMUSCULAR | Status: DC
Start: 2017-05-13 — End: 2017-05-15
  Administered 2017-05-13 – 2017-05-15 (×6): via INTRAVENOUS

## 2017-05-13 MED ORDER — DEXTROSE 50% IN WATER (D50W) IV SYRG
INTRAVENOUS | Status: DC | PRN
Start: 2017-05-13 — End: 2017-05-15

## 2017-05-13 MED ORDER — SODIUM CHLORIDE 0.9% BOLUS IV
0.9 % | INTRAVENOUS | Status: AC
Start: 2017-05-13 — End: 2017-05-13
  Administered 2017-05-13: 14:00:00 via INTRAVENOUS

## 2017-05-13 MED ORDER — SODIUM CHLORIDE 0.9 % IV
10 gram | Freq: Once | INTRAVENOUS | Status: AC
Start: 2017-05-13 — End: 2017-05-13
  Administered 2017-05-13: 16:00:00 via INTRAVENOUS

## 2017-05-13 MED ORDER — ACETAMINOPHEN 500 MG TAB
500 mg | ORAL | Status: AC
Start: 2017-05-13 — End: 2017-05-13
  Administered 2017-05-13: 16:00:00 via ORAL

## 2017-05-13 MED ORDER — SODIUM CHLORIDE 0.9 % IJ SYRG
Freq: Once | INTRAMUSCULAR | Status: AC
Start: 2017-05-13 — End: 2017-05-13
  Administered 2017-05-13: 14:00:00 via INTRAVENOUS

## 2017-05-13 MED ORDER — NALOXONE 0.4 MG/ML INJECTION
0.4 mg/mL | INTRAMUSCULAR | Status: DC | PRN
Start: 2017-05-13 — End: 2017-05-15

## 2017-05-13 MED ORDER — INSULIN LISPRO 100 UNIT/ML INJECTION
100 unit/mL | Freq: Four times a day (QID) | SUBCUTANEOUS | Status: DC
Start: 2017-05-13 — End: 2017-05-15
  Administered 2017-05-13 – 2017-05-15 (×4): via SUBCUTANEOUS

## 2017-05-13 MED ORDER — PHARMACY VANCOMYCIN NOTE
Freq: Once | Status: DC
Start: 2017-05-13 — End: 2017-05-15

## 2017-05-13 MED ORDER — GLUCAGON 1 MG INJECTION
1 mg | INTRAMUSCULAR | Status: DC | PRN
Start: 2017-05-13 — End: 2017-05-15

## 2017-05-13 MED ORDER — ACYCLOVIR 800 MG TAB
800 mg | Freq: Two times a day (BID) | ORAL | Status: DC
Start: 2017-05-13 — End: 2017-05-13

## 2017-05-13 MED ORDER — OXYCODONE 5 MG TAB
5 mg | ORAL | Status: DC | PRN
Start: 2017-05-13 — End: 2017-05-15
  Administered 2017-05-13 – 2017-05-15 (×8): via ORAL

## 2017-05-13 MED ORDER — OXYCODONE 5 MG TAB
5 mg | ORAL | Status: AC
Start: 2017-05-13 — End: 2017-05-13
  Administered 2017-05-13: 16:00:00 via ORAL

## 2017-05-13 MED ORDER — POTASSIUM CHLORIDE SR 10 MEQ TAB
10 mEq | Freq: Two times a day (BID) | ORAL | Status: DC
Start: 2017-05-13 — End: 2017-05-15
  Administered 2017-05-13 – 2017-05-15 (×4): via ORAL

## 2017-05-13 MED ORDER — ACETAMINOPHEN 650 MG RECTAL SUPPOSITORY
650 mg | RECTAL | Status: DC | PRN
Start: 2017-05-13 — End: 2017-05-15

## 2017-05-13 MED ORDER — SODIUM CHLORIDE 0.9 % IJ SYRG
INTRAMUSCULAR | Status: DC | PRN
Start: 2017-05-13 — End: 2017-05-15

## 2017-05-13 MED ORDER — TBO-FILGRASTIM 300 MCG/0.5 ML SUBCUTANEOUS SYRINGE
300 mcg/0.5 mL | Freq: Every day | SUBCUTANEOUS | Status: DC
Start: 2017-05-13 — End: 2017-05-14

## 2017-05-13 MED ORDER — VANCOMYCIN 10 GRAM IV SOLR
10 gram | Freq: Two times a day (BID) | INTRAVENOUS | Status: DC
Start: 2017-05-13 — End: 2017-05-15
  Administered 2017-05-14 – 2017-05-15 (×3): via INTRAVENOUS

## 2017-05-13 MED ORDER — ONDANSETRON (PF) 4 MG/2 ML INJECTION
4 mg/2 mL | Freq: Four times a day (QID) | INTRAMUSCULAR | Status: DC | PRN
Start: 2017-05-13 — End: 2017-05-15

## 2017-05-13 MED ORDER — ACETAMINOPHEN 325 MG TABLET
325 mg | ORAL | Status: DC | PRN
Start: 2017-05-13 — End: 2017-05-15
  Administered 2017-05-13 – 2017-05-14 (×4): via ORAL

## 2017-05-13 MED ORDER — PHARMACY VANCOMYCIN NOTE
Status: DC
Start: 2017-05-13 — End: 2017-05-15

## 2017-05-13 MED FILL — OXYCODONE 5 MG TAB: 5 mg | ORAL | Qty: 2

## 2017-05-13 MED FILL — POTASSIUM CHLORIDE SR 10 MEQ TAB: 10 mEq | ORAL | Qty: 2

## 2017-05-13 MED FILL — GRANIX 300 MCG/0.5 ML SUBCUTANEOUS SYRINGE: 300 mcg/0.5 mL | SUBCUTANEOUS | Qty: 0.5

## 2017-05-13 MED FILL — PHARMACY VANCOMYCIN NOTE: Qty: 1

## 2017-05-13 MED FILL — CEFEPIME 2 GRAM SOLUTION FOR INJECTION: 2 gram | INTRAMUSCULAR | Qty: 2

## 2017-05-13 MED FILL — DIPHENHYDRAMINE HCL 50 MG/ML IJ SOLN: 50 mg/mL | INTRAMUSCULAR | Qty: 1

## 2017-05-13 MED FILL — VANCOMYCIN 10 GRAM IV SOLR: 10 gram | INTRAVENOUS | Qty: 1250

## 2017-05-13 MED FILL — ACETAMINOPHEN 500 MG TAB: 500 mg | ORAL | Qty: 2

## 2017-05-13 MED FILL — VANCOMYCIN 10 GRAM IV SOLR: 10 gram | INTRAVENOUS | Qty: 1500

## 2017-05-13 MED FILL — ACYCLOVIR 800 MG TAB: 800 mg | ORAL | Qty: 1

## 2017-05-13 MED FILL — ACETAMINOPHEN 325 MG TABLET: 325 mg | ORAL | Qty: 2

## 2017-05-13 MED FILL — PANTOPRAZOLE 40 MG IV SOLR: 40 mg | INTRAVENOUS | Qty: 80

## 2017-05-13 MED FILL — PROTONIX 40 MG INTRAVENOUS SOLUTION: 40 mg | INTRAVENOUS | Qty: 80

## 2017-05-13 MED FILL — SODIUM CHLORIDE 0.9 % IV: INTRAVENOUS | Qty: 1000

## 2017-05-13 MED FILL — ACYCLOVIR 200 MG CAP: 200 mg | ORAL | Qty: 2

## 2017-05-13 NOTE — H&P (Signed)
Admission History and Physical      Date of note:      May 13, 2017  Patient:               [name] Wu, 57 y.o., female  Admit Date:        05/13/2017    Chief Complaint: Blood in urine    History of present illness:  Patient is a very pleasant 57 year old [place] American female, she has a history of known acute lymphoblastic leukemia. She presents to hospital with chief complaint of blood in urine. She was admitted to hospital roughly 2 weeks ago for evaluation of shortness of breath. She was discharged without any clear findings. She is currently receiving chemotherapy and oncology care in [place] at [facility] [name]. She last received her chemotherapy dose on 12/25. She will return to [place] on 12/26 and has since felt unwell, weakness, anorexia, poor by mouth intake. She has noticed some blood in her sputum after cough, did notice some darkness to her stools. Yesterday she had onset of bright red blood in the urine which alarmed her so she presented to the emergency room. In the emergency room CBC showed severe pancytopenia with a platelet count of 1000. She was also noted to spike a fever in the emergency room. Patient without diarrhea, no burning on urination, no fever or chills at home. No productive cough. No sick contacts. Her next chemotherapy is scheduled for either January 10 or January 18 and back in [place].    Assessment:     1. Neutropenic fever/severe sepsis (POA)  2. Spontaneous bleeding due to thrombocytopenia  3. Severe pancytopenia  1. Severe thrombocytopenia - 1K on admit  2. Severe leukopenia - 0.1 on admit  3. Severe anemia - Hb 5.6 on admit  4. Abnormal procalcitonin  5. Cannot r/o UTI  6. Hematuria, likely spontaneous due to platelet  7. ALL, on chemotherapy  8. DM 2  9. HTN    Plan:     - Patient spiked fever in emergency room. Treat empirically for neutropenic fever, continue IV vancomycin, IV cefepime. Patient has a  normal chest x-ray. Influenza test is negative. Blood cultures have been obtained and will follow. Urine culture also has been obtained. Her urinalysis is abnormal but suspect this to be secondary to spontaneous hematuria.    - Severe pancytopenia, 2 units of platelets as well as 2 units of red blood cells have been ordered. Likely lingering effect of recent chemotherapy. Repeat CBC after transfusion. Oncology consultation.    - Hold oral chemotherapy.    - Suspect multiple sites of blood loss due to spontaneous bleeding from severe thrombocytopenia. Continue to monitor stool and urine. If blood loss continues will follow up appropriately. Suspect this should improve after platelet transfusion.    - Glucomander    - Pain control    - Admitted to stepdown    - Discussed with patient and her brother at bedside.    DVT Prophylaxis: SCD's or Sequential Compression Device    Dispo: Stepdown unit    Code Status: Full Code    Estimated Discharge Date: 3-4 days      Past Medical History:   Diagnosis Date   ??? Diabetes ([place])    ??? Hx antineoplastic chemotherapy    ??? Hypertension    ??? Leukemia ([place])      History reviewed. No pertinent surgical history.  Social History     Socioeconomic History   ??? Marital status:  DIVORCED     Spouse name: Not on file   ??? Number of children: Not on file   ??? Years of education: Not on file   ??? Highest education level: Not on file   Social Needs   ??? Financial resource strain: Not on file   ??? Food insecurity - worry: Not on file   ??? Food insecurity - inability: Not on file   ??? Transportation needs - medical: Not on file   ??? Transportation needs - non-medical: Not on file   Occupational History   ??? Not on file   Tobacco Use   ??? Smoking status: Never Smoker   ??? Smokeless tobacco: Never Used   Substance and Sexual Activity   ??? Alcohol use: No     Frequency: Never   ??? Drug use: No   ??? Sexual activity: Not on file   Other Topics Concern   ??? Not on file   Social History Narrative   ??? Not on file      Social History     Tobacco Use   Smoking Status Never Smoker   Smokeless Tobacco Never Used     History reviewed. No pertinent family history.  Allergies   Allergen Reactions   ??? Pcn [Penicillins] Hives and Itching       Home Medications:     Prior to Admission medications    Medication Sig Start Date End Date Taking? Authorizing Provider   dasatinib (SPRYCEL) 140 mg tab Take 140 mg by mouth daily.   Yes Other, Phys, MD   famotidine (PEPCID) 20 mg tablet Take 20 mg by mouth two (2) times a day.   Yes Other, Phys, MD   prochlorperazine (COMPAZINE) 10 mg tablet Take 5 mg by mouth every six (6) hours as needed.   Yes Other, Phys, MD   polyethylene glycol (MIRALAX) 17 gram/dose powder Take 17 g by mouth daily.   Yes Other, Phys, MD   oxyCODONE IR (ROXICODONE) 5 mg immediate release tablet Take 2 Tabs by mouth every four (4) hours as needed. Max Daily Amount: 60 mg. 04/26/17  Yes Tonny Bollman, MD   acyclovir (ZOVIRAX) 400 mg tablet Take 400 mg by mouth two (2) times a day.   Yes Other, Phys, MD   tbo-filgrastim (GRANIX) 480 mcg/0.8 mL syrg injection 480 mcg by SubCUTAneous route once.   Yes Other, Phys, MD   insulin lispro (HUMALOG JUNIOR KWIKPEN U-100) 100 unit/mL inph by SubCUTAneous route three (3) times daily.   Yes Other, Phys, MD   potassium chloride SR (K-TAB) 20 mEq tablet Take 20 mEq by mouth two (2) times a day.   Yes Other, Phys, MD   levoFLOXacin (LEVAQUIN) 500 mg tablet Take 500 mg by mouth daily.   Yes Other, Phys, MD   posaconazole (NOXAFIL) 100 mg DR tablet Take 300 mg by mouth daily (with breakfast).   Yes Other, Phys, MD   insulin degludec (TRESIBA FLEXTOUCH U-200) 200 unit/mL (3 mL) inpn 20 Units by SubCUTAneous route daily.   Yes Other, Phys, MD   zolpidem (AMBIEN) 5 mg tablet Take 5 mg by mouth nightly.   Yes Other, Phys, MD       Review of Systems:     POSITIVES ARE HIGHLIGHTED IN RED    General: fevers, chills, sweats, fatigue, malaise, wt loss, wt gain, weakness  Skin: skin changes   HEENT: bleeding gums, cataracts, decreased hearing, dizziness, dry mouth, ear discharge, ear pain, epistaxis, eye discharge, eye itching,  eye pain, eye redness, headache, head injury, hoarseness, nasal congestion, oral ulcers, postnasal drip, rhinitis, ringing in the ears, sinus pain, throat itchiness, throat pain, throat soreness, toothache, vertigo, vision changes  Neck: decreased ROM, neck mass, neck pain, neck stiffness, swollen glands  Respiratory: cough, dyspnea, sputum, wheezing, pleurisy  CV: chest pain, edema, orthopnea, palpitations, PND  GI: abd pain, anorexia, constipation, diarrhea, dysphagia, hemorrhoids, hematochezia, melena, nausea, vomiting, reflux  GU: dysuria, frequency, hesitancy, incontinence, nocturia, polyuria, urgency, hematuria  MS:  pain, stiffness, decreased ROM, leg cramps  Neuro: LOC, paralysis, paresthesias, seizures, syncope, stroke, tremor    Physical Assessment:     Visit Vitals  BP 129/60   Pulse (!) 116   Temp 99.4 ??F (37.4 ??C)   Resp 18   Ht 5' (1.524 m)   Wt 76.7 kg (169 lb)   SpO2 100%   BMI 33.01 kg/m??     GEN - AAOx3, NAD  HEENT - NC/AT, PERRL, EOMI, mucous membranes moist  Neck - supple, no JVD  Cardiac - RRR, S1, S2, no murmurs  Chest/Lungs - clear to auscultation without wheezes or rhonchi  Abdomen - soft, nontender, nondistended, normal bowel sounds  Extremities - no C/C/E  Neuro - nonfocal, intact; CN2-12 normal; motor - equal, symmetric strength; sensation - intact; cerebellar function intact finger to nose and heel to shin; patellar DTR 2/4  Skin - no rashes or lesions    Labs:   Recent Results (from the past 24 hour(s))   PROTHROMBIN TIME + INR    Collection Time: 05/13/17  8:33 AM   Result Value Ref Range    Prothrombin time 14.1 (H) 10.2 - 12.9 seconds    INR 1.2 (H) 0.1 - 1.1     METABOLIC PANEL, BASIC    Collection Time: 05/13/17  8:33 AM   Result Value Ref Range    Sodium 141 136 - 145 mEq/L    Potassium 3.6 3.5 - 5.1 mEq/L    Chloride 108 (H) 98 - 107 mEq/L     CO2 26 21 - 32 mEq/L    Glucose 151 (H) 74 - 106 mg/dl    BUN 13 7 - 25 mg/dl    Creatinine 0.8 0.6 - 1.3 mg/dl    GFR est AA >60.0      GFR est non-AA >60      Calcium 8.9 8.5 - 10.1 mg/dl    Anion gap 7 5 - 15 mmol/L   LACTIC ACID    Collection Time: 05/13/17  8:33 AM   Result Value Ref Range    Lactic Acid 1.1 0.4 - 2.0 mmol/L   PROCALCITONIN    Collection Time: 05/13/17  8:33 AM   Result Value Ref Range    PROCALCITONIN 0.63 (H) 0.00 - 0.50 ng/ml   URINALYSIS W/ RFLX MICROSCOPIC    Collection Time: 05/13/17  8:33 AM   Result Value Ref Range    Color BLOODY (A) YELLOW,STRAW      Appearance GROSSLY BLOODY (A) CLEAR      Glucose NEGATIVE NEGATIVE,Negative mg/dl    Bilirubin NEGATIVE NEGATIVE,Negative      Ketone NEGATIVE NEGATIVE,Negative mg/dl    Specific gravity 1.010 1.005 - 1.030      Blood LARGE (A) NEGATIVE,Negative      pH (UA) 7.5 5.0 - 9.0      Protein >=300 (AA) NEGATIVE,Negative mg/dl    Urobilinogen 1.0 0.0 - 1.0 mg/dl    Nitrites POSITIVE (A) NEGATIVE,Negative  Leukocyte Esterase TRACE (A) NEGATIVE,Negative     POC URINE MICROSCOPIC    Collection Time: 05/13/17  8:33 AM   Result Value Ref Range    Epithelial cells, squamous 1-4 /LPF    RBC >50 /HPF    Bacteria OCCASIONAL /HPF   INFLUENZA A/B, BY PCR    Collection Time: 05/13/17  9:20 AM   Result Value Ref Range    Influenza A PCR NEGATIVE NEGATIVE      Influenza B PCR NEGATIVE NEGATIVE     CBC WITH AUTOMATED DIFF    Collection Time: 05/13/17  9:50 AM   Result Value Ref Range    WBC 0.1 (LL) 4.0 - 11.0 1000/mm3    NEUTROPHILS 9.1 (L) 34 - 64 %    LYMPHOCYTES 90.9 (H) 28 - 48 %    RBC 1.85 (L) 3.60 - 5.20 M/uL    HGB 5.6 (LL) 13.0 - 17.2 gm/dl    HCT 16.1 (LL) 37.0 - 50.0 %    MCV 87.0 80.0 - 98.0 fL    MCH 30.3 25.4 - 34.6 pg    MCHC 34.8 30.0 - 36.0 gm/dl    PLATELET 1 (LL) 140 - 450 1000/mm3    RDW-SD 50.4 (H) 36.4 - 46.3      RBC Morphology NORMAL      PLATELET COMMENTS DECREASED      Smudge cells MODERATE         Radiology:     XR Results:   Results from Hospital Encounter encounter on 05/13/17   XR CHEST PA LAT    Narrative PA and lateral chest    Comparison: None    Indication: Fever    Findings: Chest reveals normal Cardiac silhouette. Lungs are clear of any  failure, acute infiltrates, or other abnormality. Bony structures are normal.  Costophrenic angles are clear.       Impression Impression: Negative chest.         CT Results:  Results from Hospital Encounter encounter on 04/25/17   CTA CHEST W OR W WO CONT    Narrative Indication: Dyspnea. Leukemia. Diabetes. Hypertension. Chemotherapy. Elevated  troponin and chest pressure.      Impression IMPRESSION: No pulmonary embolism. Mild cardiomegaly. Bibasilar discoid  atelectasis.    Comment: 3-D CTA of the chest was performed following administration of  intravenous contrast. Multiplanar PE protocol and MIPS projections were  obtained. The initial report was rendered by [facility] radiology services.    The heart is mildly enlarged. There is no pulmonary embolism or aortic  dissection. The aorta is of normal caliber.    No pleural or pericardial effusion.    There is dependent subsegmental atelectasis at both lung bases. No pulmonary  consolidation or mass.    No adenopathy.         MRI Results:  No results found for this or any previous visit.    Nuclear Medicine Results:  No results found for this or any previous visit.    [place] Results:  No results found for this or any previous visit.    IR Results (maximum last 3):  No results found for this or any previous visit.    VAS/US Results (maximum last 3):  Results from [place] encounter on 04/25/17   DUPLEX LOWER EXT VENOUS BILAT    Narrative  Study ID: [facility]                                            [address] Home,                                         [place]                                  Lower Extremity Venous Report    Name: ADREANNE, YONO            Study Date: 04/25/2017 08:46 AM  MRN: [id number]                     Patient Location: CD06  DOB: 07-12-1960                  Age: 58 yrs  Gender: Female                   Account #: [id number]  Reason For Study: Elevated D-dimer  Ordering Physician: Meredeth Ide  Performed By: Wannetta Sender    Interpretation Summary  No evidence of acute superficial or deep vein thrombosis noted in either  lower extremity .    _____________________________________________________________________________  Verneda Skill  Complete bilateral venous duplex performed. ICD 10: R78.87.    HISTORY/SYMPTOMS  Cancer. Elevated d-dimer.    RIGHT LEG  The right common femoral, femoral, popliteal, posterior tibial and peroneal  veins were examined with duplex ultrasound. The deep veins were patent and  compressible with no evidence of intraluminal thrombus. Spontaneous, phasic  venous flow with normal augmentation was noted throughout the right leg.  RIGHT SAPHENOUS VEINS  Compression of the right proximal greater saphenous vein is complete.  Spectral Doppler exam demonstrates normal and spontaneous venous flow in the  right proximal greater saphenous vein.    LEFT LEG  The left common femoral, femoral, popliteal, posterior tibial and peroneal  veins were examined with duplex ultrasound. The deep veins were patent and  compressible with no evidence of intraluminal thrombus. Spontaneous, phasic  venous flow with normal augmentation was noted throughout the left leg.    LEFT LEG SAPHENOUS VEINS  Compression of the left proximal greater saphenous vein is complete. Spectral   Doppler exam demonstrates normal and spontaneous venous flow in the left  proximal greater saphenous vein.      Electronically signed byDr. Judithe Modest, M.D   04/25/2017 05:31 PM           Total Critical Care clinical care time is 60 minutes, including review of chart including all labs, radiology, past medical history, and discussion with patient and family.     Sundra Aland, MD  [facility]  May 13, 2017, 12:52 PM

## 2017-05-13 NOTE — ED Triage Notes (Signed)
Fever x 2 days and this am had slight blood noted in stool, sputum and nasal drainage.  Also states her gums are bleeding

## 2017-05-13 NOTE — Progress Notes (Signed)
Problem: Falls - Risk of  Goal: *Absence of Falls  Document Schmid Fall Risk and appropriate interventions in the flowsheet.  Outcome: Progressing Towards Goal  Fall Risk Interventions:            Medication Interventions: Bed/chair exit alarm, Patient to call before getting OOB, Teach patient to arise slowly

## 2017-05-13 NOTE — Progress Notes (Addendum)
2030 Patient requesting review of some of her PTA meds which are not ordered to be added to current medication list. Page sent to [facility] hospitalist. Awaiting return call. [place] RN    PAGER ID: [phone]   MESSAGE: [facility] ICU4 bed 11-Admitted for pancytopenia. Requesting PTA meds Levaquin 500mg  QD, Noxafil 300mg  qAC, Senna 2 tabs QHS PRN, Magic Mouthwash 35ml QID PRN. Ext Marge Duncans    2110 Return call from Dr. Lynnette Caffey. Orders received for PTA meds to be ordered. Continue to monitor. DCJ RN

## 2017-05-13 NOTE — ED Provider Notes (Addendum)
[facility]  Emergency Department Treatment Report    Patient: [name] Wu Age: 57 y.o. Sex: female    Date of Birth: 04-07-1961 Admit Date: 05/13/2017 PCP: Lacey Jensen, MD   MRN: [id number]  CSN: [id number]  Attending: Martin Majestic, MD   Room: WG95/AO13 Time Dictated: 7:04 AM APP: Enrique Sack, PA-C     I hereby certify this patient for admission based upon medical necessity as ??  noted below:     Chief Complaint   Chief Complaint   Patient presents with   ??? Fever   ??? Cough       History of Present Illness   57 y.o. 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  Fever was as high as 102.7 ??F.  She did not take any medication this morning for symptoms.  She states she has not had a bowel movement in approximately 6 days but she also has not been eating and drinking well.  Denies abdominal pain, chest pain, shortness of breath or other complaints.  She is not on anticoagulants.    Review of Systems   Review of Systems   Constitutional: Positive for fever and malaise/fatigue. Negative for chills.   HENT: Negative for congestion and sore throat.    Eyes: Negative for discharge and redness.   Respiratory: Positive for cough. Negative for hemoptysis, sputum production and shortness of breath.    Cardiovascular: Negative for chest pain.   Gastrointestinal: Positive for constipation, nausea and vomiting. Negative for abdominal pain, blood in stool, diarrhea and melena.   Genitourinary: Positive for hematuria. Negative for dysuria and frequency.   Musculoskeletal: Negative for myalgias.   Skin: Negative for rash.   Neurological: Negative for dizziness and headaches.       Past Medical/Surgical History     Past Medical History:   Diagnosis Date   ??? Diabetes ([place])    ??? Hx antineoplastic chemotherapy    ??? Hypertension     ??? Leukemia ([place])      History reviewed. No pertinent surgical history.    Social History     Social History     Socioeconomic History   ??? Marital status: DIVORCED     Spouse name: Not on file   ??? Number of children: Not on file   ??? Years of education: Not on file   ??? Highest education level: Not on file   Tobacco Use   ??? Smoking status: Never Smoker   ??? Smokeless tobacco: Never Used   Substance and Sexual Activity   ??? Alcohol use: No     Frequency: Never   ??? Drug use: No       Family History   History reviewed. No pertinent family history.    Current Medications     Prior to Admission Medications   Prescriptions Last Dose Informant Patient Reported? Taking?   acyclovir (ZOVIRAX) 400 mg tablet 05/12/2017 at Unknown time  Yes Yes   Sig: Take 400 mg by mouth two (2) times a day.   dasatinib (SPRYCEL) 140 mg tab 05/12/2017 at Unknown time  Yes Yes   Sig: Take 140 mg by mouth daily.   famotidine (PEPCID) 20 mg tablet 05/12/2017 at Unknown time  Yes Yes   Sig: Take 20 mg by mouth two (2) times a day.   insulin degludec (TRESIBA  FLEXTOUCH U-200) 200 unit/mL (3 mL) inpn 05/12/2017 at Unknown time  Yes Yes   Sig: 20 Units by SubCUTAneous route daily.   insulin lispro (HUMALOG JUNIOR KWIKPEN U-100) 100 unit/mL inph 05/12/2017 at Unknown time  Yes Yes   Sig: by SubCUTAneous route three (3) times daily.   levoFLOXacin (LEVAQUIN) 500 mg tablet 05/12/2017 at Unknown time  Yes Yes   Sig: Take 500 mg by mouth daily.   oxyCODONE IR (ROXICODONE) 5 mg immediate release tablet 05/13/2017 at Unknown time  No Yes   Sig: Take 2 Tabs by mouth every four (4) hours as needed. Max Daily Amount: 60 mg.   posaconazole (NOXAFIL) 100 mg DR tablet 04/12/2017 at Unknown time  Yes Yes   Sig: Take 300 mg by mouth daily (with breakfast).   potassium chloride SR (K-TAB) 20 mEq tablet 05/12/2017 at Unknown time  Yes Yes   Sig: Take 20 mEq by mouth two (2) times a day.   prochlorperazine (COMPAZINE) 10 mg tablet 05/12/2017 at Unknown time  Yes Yes    Sig: Take 5 mg by mouth every six (6) hours as needed.   tbo-filgrastim (GRANIX) 480 mcg/0.8 mL syrg injection 04/12/2017 at Unknown time  Yes Yes   Sig: 480 mcg by SubCUTAneous route once.   zolpidem (AMBIEN) 5 mg tablet 05/12/2017 at Unknown time  Yes Yes   Sig: Take 5 mg by mouth nightly.      Facility-Administered Medications: None       Allergies     Allergies   Allergen Reactions   ??? Pcn [Penicillins] Hives and Itching       Physical Exam     ED Triage Vitals [05/13/17 0648]   Enc Vitals Group      BP 130/59      Pulse (Heart Rate) (!) 116      Resp Rate 18      Temp (!) 101.6 ??F (38.7 ??C)      Temp src       O2 Sat (%) 100 %      Weight 169 lb      Height 5'     Physical Exam   Constitutional: She is oriented to person, place, and time.   Well developed and well nourished. Appearance and behavior are appropriate for age and situation.  Nontoxic in appearance.   HENT:   Head: Normocephalic.   Nose: No rhinorrhea. No epistaxis.   Mouth/Throat: Uvula is midline, oropharynx is clear and moist and mucous membranes are normal. Mucous membranes are not pale, not dry and not cyanotic. No oral lesions. No trismus in the jaw. No uvula swelling. No oropharyngeal exudate, posterior oropharyngeal edema, posterior oropharyngeal erythema or tonsillar abscesses.   Cerumen in ear canals bilaterally.  TMs not visible.  External ears normal.   Eyes: Conjunctivae are normal. No scleral icterus.   Neck: Normal range of motion. Neck supple.   Cardiovascular: Regular rhythm and normal heart sounds. Tachycardia present.   No murmur heard.  Pulses:       Radial pulses are 2+ on the right side, and 2+ on the left side.   No peripheral edema   Pulmonary/Chest: Effort normal and breath sounds normal. No accessory muscle usage. No tachypnea. No respiratory distress. She has no decreased breath sounds. She has no wheezes. She has no rhonchi. She has no rales.   Abdominal: Soft. She exhibits no distension and no mass. There is no  hepatosplenomegaly. There is no tenderness. There is no  rigidity, no rebound and no guarding.   Musculoskeletal: She exhibits no deformity.   Lymphadenopathy:     She has no cervical adenopathy.   Neurological: She is alert and oriented to person, place, and time. She displays facial symmetry and normal speech.   Answering questions appropriately.    Skin: Skin is warm and dry. No rash noted. She is not diaphoretic.   Nursing note and vitals reviewed.      Impression and Management Plan   This is a 57 y.o. female presenting to the ED for evaluation of fevers, epistaxis, bleeding gums, cough, hematuria, and hematochezia.  She is febrile.  She is tachycardic.  Given that she is a cancer patient, septic workup will be initiated.    Diagnostic Studies   Lab:   Recent Results (from the past 12 hour(s))   PROTHROMBIN TIME + INR    Collection Time: 05/13/17  8:33 AM   Result Value Ref Range    Prothrombin time 14.1 (H) 10.2 - 12.9 seconds    INR 1.2 (H) 0.1 - 1.1     METABOLIC PANEL, BASIC    Collection Time: 05/13/17  8:33 AM   Result Value Ref Range    Sodium 141 136 - 145 mEq/L    Potassium 3.6 3.5 - 5.1 mEq/L    Chloride 108 (H) 98 - 107 mEq/L    CO2 26 21 - 32 mEq/L    Glucose 151 (H) 74 - 106 mg/dl    BUN 13 7 - 25 mg/dl    Creatinine 0.8 0.6 - 1.3 mg/dl    GFR est AA >60.0      GFR est non-AA >60      Calcium 8.9 8.5 - 10.1 mg/dl    Anion gap 7 5 - 15 mmol/L   LACTIC ACID    Collection Time: 05/13/17  8:33 AM   Result Value Ref Range    Lactic Acid 1.1 0.4 - 2.0 mmol/L   PROCALCITONIN    Collection Time: 05/13/17  8:33 AM   Result Value Ref Range    PROCALCITONIN 0.63 (H) 0.00 - 0.50 ng/ml   URINALYSIS W/ RFLX MICROSCOPIC    Collection Time: 05/13/17  8:33 AM   Result Value Ref Range    Color BLOODY (A) YELLOW,STRAW      Appearance GROSSLY BLOODY (A) CLEAR      Glucose NEGATIVE NEGATIVE,Negative mg/dl    Bilirubin NEGATIVE NEGATIVE,Negative      Ketone NEGATIVE NEGATIVE,Negative mg/dl     Specific gravity 1.010 1.005 - 1.030      Blood LARGE (A) NEGATIVE,Negative      pH (UA) 7.5 5.0 - 9.0      Protein >=300 (AA) NEGATIVE,Negative mg/dl    Urobilinogen 1.0 0.0 - 1.0 mg/dl    Nitrites POSITIVE (A) NEGATIVE,Negative      Leukocyte Esterase TRACE (A) NEGATIVE,Negative     POC URINE MICROSCOPIC    Collection Time: 05/13/17  8:33 AM   Result Value Ref Range    Epithelial cells, squamous 1-4 /LPF    RBC >50 /HPF    Bacteria OCCASIONAL /HPF   INFLUENZA A/B, BY PCR    Collection Time: 05/13/17  9:20 AM   Result Value Ref Range    Influenza A PCR NEGATIVE NEGATIVE      Influenza B PCR NEGATIVE NEGATIVE     CBC WITH AUTOMATED DIFF    Collection Time: 05/13/17  9:50 AM   Result Value Ref Range  WBC 0.1 (LL) 4.0 - 11.0 1000/mm3    NEUTROPHILS 9.1 (L) 34 - 64 %    LYMPHOCYTES 90.9 (H) 28 - 48 %    RBC 1.85 (L) 3.60 - 5.20 M/uL    HGB 5.6 (LL) 13.0 - 17.2 gm/dl    HCT 16.1 (LL) 37.0 - 50.0 %    MCV 87.0 80.0 - 98.0 fL    MCH 30.3 25.4 - 34.6 pg    MCHC 34.8 30.0 - 36.0 gm/dl    PLATELET 1 (LL) 140 - 450 1000/mm3    RDW-SD 50.4 (H) 36.4 - 46.3      RBC Morphology NORMAL      PLATELET COMMENTS DECREASED      Smudge cells MODERATE     TYPE + CROSSMATCH    Collection Time: 05/13/17 11:45 AM   Result Value Ref Range    Product ID Red Blood Cells      Unit number [id number]      Crossmatch result Compatible      Status info. Ready      Product code [phone]      Blood type code 6200      Specimen expiration date [id number]     PLATELETS, ALLOCATE    Collection Time: 05/13/17 11:45 AM   Result Value Ref Range    Product ID Platelets      Unit number [id number]      Status info. Ready      Product code [phone]      Blood type code 6200     BLOOD TYPE, (ABO+RH)    Collection Time: 05/13/17 12:15 PM   Result Value Ref Range    ABO/Rh(D) A Rh Positive     ANTIBODY SCREEN    Collection Time: 05/13/17 12:15 PM   Result Value Ref Range    Antibody screen NEG     TYPE + CROSSMATCH     Collection Time: 05/13/17  1:50 PM   Result Value Ref Range    Product ID Red Blood Cells      Unit number [id number]      Crossmatch result Compatible      Status info. Ready      Product code [phone]      Blood type code 6200      Specimen expiration date [id number]     PLATELETS, ALLOCATE    Collection Time: 05/13/17  2:14 PM   Result Value Ref Range    Product ID Platelets      Unit number [id number]      Status info. Issued      Product code [phone]      Blood type code 6200      Issue date/time [id number]         Imaging:    Xr Chest Pa Lat    Result Date: 05/13/2017  PA and lateral chest Comparison: None Indication: Fever Findings: Chest reveals normal Cardiac silhouette. Lungs are clear of any failure, acute infiltrates, or other abnormality. Bony structures are normal. Costophrenic angles are clear.     Impression: Negative chest.       ED Course / Medical Decision Making     ED Course as of May 14 1607   Tue May 13, 2017   1301 I spoke with Dr. Josie Saunders who agrees to admit to inpatient stepdown and asked that we get oncology on board.  Patient saw Dr. Edd Arbour when she was admitted in the past.   [KC]  ED Course User Index  [KC] Gwenith Daily, PA-C       Medications   cefepime (MAXIPIME) 2 g in 0.9% sodium chloride (MBP/ADV) 100 mL MBP (2 g IntraVENous New Bag 05/13/17 1040)   acyclovir (ZOVIRAX) tablet 400 mg (not administered)   oxyCODONE IR (ROXICODONE) tablet 10 mg (10 mg Oral Given 05/13/17 1545)   potassium chloride SR (KLOR-CON 10) tablet 20 mEq (not administered)   zolpidem (AMBIEN) tablet 5 mg (not administered)   ondansetron (ZOFRAN) injection 4 mg (not administered)   pantoprazole (PROTONIX) 40 mg in sodium chloride 0.9% 10 mL injection (not administered)   0.9% sodium chloride infusion (not administered)   sodium chloride (NS) flush 5-10 mL (not administered)   sodium chloride (NS) flush 5-10 mL (not administered)   acetaminophen (TYLENOL) tablet 650 mg (not administered)     Or    acetaminophen (TYLENOL) solution 650 mg (not administered)     Or   acetaminophen (TYLENOL) suppository 650 mg (not administered)   naloxone (NARCAN) injection 0.1 mg (not administered)   dextrose (D50W) injection syrg 5-25 g (not administered)   glucagon (GLUCAGEN) injection 1 mg (not administered)   insulin glargine (LANTUS) injection 1-100 Units (not administered)   insulin lispro (HUMALOG) injection 1-100 Units (not administered)   insulin lispro (HUMALOG) injection 1-100 Units (not administered)   tbo-filgrastim (GRANIX) injection 300 mcg (300 mcg SubCUTAneous Refused 05/13/17 1432)   vancomycin (VANCOCIN) 1,250 mg in 0.9% sodium chloride 250 mL IVPB (not administered)   Vanco trough due (not administered)   *Vancomycin per Pharmacy Dosing (not administered)   sodium chloride (NS) flush 5-10 mL (10 mL IntraVENous Given 05/13/17 0913)   sodium chloride 0.9 % bolus infusion 1,000 mL (1,000 mL IntraVENous New Bag 05/13/17 0913)   pantoprazole (PROTONIX) 80 mg in sodium chloride 0.9% 20 mL injection (80 mg IntraVENous Given 05/13/17 0913)   vancomycin (VANCOCIN) 1,500 mg in 0.9% sodium chloride 500 mL IVPB (1,500 mg IntraVENous New Bag 05/13/17 1115)   acetaminophen (TYLENOL) tablet 1,000 mg (1,000 mg Oral Given 05/13/17 1041)   oxyCODONE IR (ROXICODONE) tablet 10 mg (10 mg Oral Given 05/13/17 1041)        Patient remained stable during her stay in the ED, she not develop any new or worsening symptoms.  Laboratory studies and radiology results reviewed.  WBC count of 0.1, hemoglobin of 5.6, hematocrit of 16.1, platelets of 1, neutrophils and 9.1 and lymphocytes of 90.9.  Neutropenic precautions started.  She was started on broad-spectrum antibiotics.  Tylenol given for fever which improved.  Patient signed informed consent for platelets and packed red blood cells.  2 units of each ordered.  She does have a urinary tract infection.  She is already been started on broad-spectrum  antibiotics.  Culture pending.  Influenza negative.  She was started on IV Protonix.  Patient later denied having vomiting or hematemesis.  PT and INR slightly elevated.  Pro-calcitonin slightly elevated with a normal lactic acid.  Results of lab/radiology tests were discussed with the patient.  All questions were answered and concerns addressed.  I discussed the patient's case with Dr. Josie Saunders who agrees to admit to inpatient stepdown.  I spoke with oncology who will consult on the patient.  Patient agrees with the plan for admission.    Chart completion by Dr. Lenoard Aden:  I, Martin Majestic, MD , have personally seen and examined this patient; I have fully participated in the care of this patient with the advanced  practice provider.  I have reviewed and agree with all pertinent clinical information including history, physical exam, labs, radiographic studies and the plan.  I have also reviewed and agree with the medications, allergies and past medical history sections for this patient.      Briefly, this is a 57 y.o. female who presents to the ED with fever.  She is on chemo.  Also hematuria and gum bleeding.  On physical exam, patient Is lying in bed and looks like she does not feel well.  She is febrile and tachycardic.  Heart is tachycardic but regular.  Lungs are clear to auscultation bilaterally.  Abdomen is soft and nontender.  Given that she is febrile and tachycardic and is on chemo, she was treated empirically with vancomycin and Zosyn.  Blood and urine cultures sent.  Patient is found to be thrombocytopenic as well as neutropenic and anemic.  She is already treated with antibiotics for her neutropenic fever.  She was given 2 units of packed red blood cells as well as 2 units of platelets.  She has a UTI which is treated with the above antibiotics.  She'll be admitted for ongoing    The total Critical Care time caring for this patient with a life  threatening illness is 35 minutes.  This includes direct patient care, reviewing medical records, evaluating results of diagnostic testing, discussions with family members, and consulting with physicians, and excludes separately billable procedures.    Please note that portions of this note were completed with a voice recognition program.  Efforts were made to edit the dictations but occasionally words are mis-transcribed.    Martin Majestic, MD  May 13, 2017        Final Diagnosis       ICD-10-CM ICD-9-CM   1. Neutropenic fever ([place]) D70.9 288.00    R50.81 780.61   2. Thrombocytopenia ([facility]) D69.6 287.5   3. Anemia, unspecified type D64.9 285.9   4. Cough R05 786.2   5. Tachycardia R00.0 785.0       Disposition     Admission      The patient was personally evaluated by myself and Loleta Frommelt, Clent Ridges, MD who agrees with the above assessment and plan.    Dragon medical dictation software was used for portions of this report. Unintended transcription errors may occur.     Gwenith Daily, PA-C  May 13, 2017      My signature above authenticates this document and my orders, the final ??  diagnosis (es), discharge prescription (s), and instructions in the Epic record.  If you have any questions please contact [phone].  ??  Nursing notes have been reviewed by the physician/ advanced practice Clinician.

## 2017-05-13 NOTE — Other (Signed)
Bedside shift change report given to [facility] (Soil scientist) by Cassell Smiles (offgoing nurse). Report included the following information SBAR.

## 2017-05-13 NOTE — Other (Signed)
Bedside and Verbal shift change report given to [place] (oncoming nurse) by Cassell Smiles RN (offgoing nurse). Report included the following information SBAR, Kardex, Intake/Output, MAR and Accordion.

## 2017-05-13 NOTE — ED Notes (Signed)
TRANSFER - OUT REPORT:    Verbal report given to Cassell Smiles, Therapist, sports (name) on Maryfrances Portugal  being transferred to 1411 (unit) for routine progression of care       Report consisted of patient???s Situation, Background, Assessment and   Recommendations(SBAR).     Information from the following report(s) SBAR was reviewed with the receiving nurse.    Lines:   Peripheral IV 05/13/17 Left Antecubital (Active)   Site Assessment Clean, dry, & intact 05/13/2017  8:30 AM   Phlebitis Assessment 0 05/13/2017  8:30 AM   Infiltration Assessment 0 05/13/2017  8:30 AM   Dressing Status Clean, dry, & intact 05/13/2017  8:30 AM   Alcohol Cap Used Yes 05/13/2017  8:30 AM       Peripheral IV 05/13/17 Left;Distal Forearm (Active)        Opportunity for questions and clarification was provided.      Patient transported with:   Registered Nurse  Tech

## 2017-05-13 NOTE — Progress Notes (Signed)
[facility] Dosing Services: Vancomycin    Consult for Vancomycin Dosing by Pharmacy by Enrique Sack, PA  Consult provided for this 57 y.o. year old female , for indication of sepsis.  Day of Therapy New Start    Ht Readings from Last 1 Encounters:   05/13/17 152.4 cm (60")        Wt Readings from Last 1 Encounters:   05/13/17 76.7 kg (169 lb)        Previous Regimen    Last Level    Other Current Antibiotics Cefepime, Acyclovir   Significant Cultures    Serum Creatinine Lab Results   Component Value Date/Time    Creatinine 0.8 05/13/2017 08:33 AM      Creatinine Clearance Estimated Creatinine Clearance: 71.9 mL/min (based on SCr of 0.8 mg/dL).   BUN Lab Results   Component Value Date/Time    BUN 13 05/13/2017 08:33 AM      WBC Lab Results   Component Value Date/Time    WBC 0.1 (LL) 05/13/2017 09:50 AM      H/H Lab Results   Component Value Date/Time    HGB 5.6 (LL) 05/13/2017 09:50 AM      Platelets Lab Results   Component Value Date/Time    PLATELET 1 (LL) 05/13/2017 09:50 AM      Temp 99.4 ??F (37.4 ??C)     Start Vancomycin therapy, with loading dose of 1500 (mg) at 05/13/17 at 1100 (time/date).  Follow with maintenance dose of 1250(mg) at 05/13/17 at 2300 (time/date), every 12 hours (frequency).    Dose calculated to approximate a therapeutic trough of 15-20 mcg/mL.      Pharmacy to follow daily and will make changes to dose and/or frequency based on clinical status.    Additional recommendations regarding antibiotic therapy:  _________________________________     Pharmacist Livingston Diones, PHARMD

## 2017-05-14 LAB — CBC WITH AUTOMATED DIFF
BASOPHILS: 0 % (ref 0–3)
EOSINOPHILS: 6.7 % — ABNORMAL HIGH (ref 0–5)
HCT: 20.1 % — CL (ref 37.0–50.0)
HGB: 6.9 gm/dl — CL (ref 13.0–17.2)
IMMATURE GRANULOCYTES: 0 % (ref 0.0–3.0)
LYMPHOCYTES: 66.7 % — ABNORMAL HIGH (ref 28–48)
MCH: 29.1 pg (ref 25.4–34.6)
MCHC: 34.3 gm/dl (ref 30.0–36.0)
MCV: 84.8 fL (ref 80.0–98.0)
MONOCYTES: 0 % — ABNORMAL LOW (ref 1–13)
MPV: 9.4 fL (ref 6.0–10.0)
NEUTROPHILS: 26.6 % — ABNORMAL LOW (ref 34–64)
NRBC: 0 (ref 0–0)
PLATELET COMMENTS: DECREASED
PLATELET: 37 10*3/uL — CL (ref 140–450)
RBC: 2.37 M/uL — ABNORMAL LOW (ref 3.60–5.20)
RDW-SD: 48.6 — ABNORMAL HIGH (ref 36.4–46.3)
Smudge cells: 6.7
WBC: 0.2 10*3/uL — CL (ref 4.0–11.0)

## 2017-05-14 LAB — TYPE + CROSSMATCH
Blood type code: 6200
Blood type code: 6200
Issue date/time: 201901011812
Issue date/time: 201901012101
Specimen expiration date: 201901042359
Specimen expiration date: 201901042359
Status info.: TRANSFUSED
Status info.: TRANSFUSED

## 2017-05-14 LAB — METABOLIC PANEL, COMPREHENSIVE
ALT (SGPT): 21 U/L (ref 12–78)
AST (SGOT): 15 U/L (ref 15–37)
Albumin: 2.7 gm/dl — ABNORMAL LOW (ref 3.4–5.0)
Alk. phosphatase: 70 U/L (ref 45–117)
Anion gap: 7 mmol/L (ref 5–15)
BUN: 10 mg/dl (ref 7–25)
Bilirubin, total: 1.4 mg/dl — ABNORMAL HIGH (ref 0.2–1.0)
CO2: 25 mEq/L (ref 21–32)
Calcium: 8.4 mg/dl — ABNORMAL LOW (ref 8.5–10.1)
Chloride: 113 mEq/L — ABNORMAL HIGH (ref 98–107)
Creatinine: 0.7 mg/dl (ref 0.6–1.3)
GFR est AA: >60
GFR est non-AA: >60
Glucose: 118 mg/dl — ABNORMAL HIGH (ref 74–106)
Potassium: 3.7 mEq/L (ref 3.5–5.1)
Protein, total: 5.6 gm/dl — ABNORMAL LOW (ref 6.4–8.2)
Sodium: 145 mEq/L (ref 136–145)

## 2017-05-14 LAB — PLATELETS, ALLOCATE
Blood type code: 6200
Blood type code: 6200
Issue date/time: 201901011527
Issue date/time: 201901011720
Status info.: TRANSFUSED
Status info.: TRANSFUSED

## 2017-05-14 LAB — CULTURE, URINE
CULTURE RESULT: NO GROWTH
Culture result: NO GROWTH

## 2017-05-14 LAB — GLUCOSE, POC
Glucose (POC): 205 mg/dL — ABNORMAL HIGH (ref 65–105)
Glucose (POC): 121 mg/dL — ABNORMAL HIGH (ref 65–105)
Glucose (POC): 181 mg/dL — ABNORMAL HIGH (ref 65–105)
Glucose (POC): 157 mg/dL — ABNORMAL HIGH (ref 65–105)

## 2017-05-14 LAB — BLOOD TYPE, (ABO+RH)
ABO/Rh(D): A POS
ABO/Rh: A POS

## 2017-05-14 LAB — ANTIBODY SCREEN: Antibody screen: NEGATIVE

## 2017-05-14 MED ORDER — SENNOSIDES-DOCUSATE SODIUM 8.6 MG-50 MG TAB
Freq: Four times a day (QID) | ORAL | Status: DC | PRN
Start: 2017-05-14 — End: 2017-05-15
  Administered 2017-05-14: 16:00:00 via ORAL

## 2017-05-14 MED ORDER — MAGIC MOUTHWASH COMPOUNDING KIT
25-200-400-40 mg/30mL | Freq: Four times a day (QID) | Status: DC | PRN
Start: 2017-05-14 — End: 2017-05-15

## 2017-05-14 MED ORDER — DIPHENHYDRAMINE HCL 50 MG/ML IJ SOLN
50 mg/mL | INTRAMUSCULAR | Status: DC | PRN
Start: 2017-05-14 — End: 2017-05-15

## 2017-05-14 MED ORDER — DEXTROSE 50% IN WATER (D50W) IV SYRG
INTRAVENOUS | Status: DC | PRN
Start: 2017-05-14 — End: 2017-05-14

## 2017-05-14 MED ORDER — DIPHENHYDRAMINE 25 MG CAP
25 mg | Freq: Four times a day (QID) | ORAL | Status: DC | PRN
Start: 2017-05-14 — End: 2017-05-14

## 2017-05-14 MED ORDER — POSACONAZOLE 100 MG TABLET,DELAYED RELEASE
100 mg | Freq: Every day | ORAL | Status: DC
Start: 2017-05-14 — End: 2017-05-15
  Administered 2017-05-15: 03:00:00 via ORAL

## 2017-05-14 MED ORDER — TBO-FILGRASTIM 480 MCG/0.8 ML SUBCUTANEOUS SYRINGE
480 mcg/0.8 mL | Freq: Every day | SUBCUTANEOUS | Status: DC
Start: 2017-05-14 — End: 2017-05-15

## 2017-05-14 MED ORDER — DIPHENHYDRAMINE HCL 50 MG/ML IJ SOLN
50 mg/mL | Freq: Four times a day (QID) | INTRAMUSCULAR | Status: DC | PRN
Start: 2017-05-14 — End: 2017-05-14
  Administered 2017-05-14: 20:00:00 via INTRAVENOUS

## 2017-05-14 MED ORDER — SODIUM CHLORIDE 0.9 % IV
INTRAVENOUS | Status: DC | PRN
Start: 2017-05-14 — End: 2017-05-15

## 2017-05-14 MED ORDER — DIPHENHYDRAMINE HCL 50 MG/ML IJ SOLN
50 mg/mL | Freq: Once | INTRAMUSCULAR | Status: AC
Start: 2017-05-14 — End: 2017-05-14
  Administered 2017-05-14: via INTRAVENOUS

## 2017-05-14 MED ORDER — LEVOFLOXACIN 500 MG TAB
500 mg | ORAL | Status: DC
Start: 2017-05-14 — End: 2017-05-15
  Administered 2017-05-14 – 2017-05-15 (×2): via ORAL

## 2017-05-14 MED FILL — PANTOPRAZOLE 40 MG IV SOLR: 40 mg | INTRAVENOUS | Qty: 40

## 2017-05-14 MED FILL — CEFEPIME 2 GRAM SOLUTION FOR INJECTION: 2 gram | INTRAMUSCULAR | Qty: 2

## 2017-05-14 MED FILL — OXYCODONE 5 MG TAB: 5 mg | ORAL | Qty: 2

## 2017-05-14 MED FILL — DIPHENHYDRAMINE HCL 50 MG/ML IJ SOLN: 50 mg/mL | INTRAMUSCULAR | Qty: 1

## 2017-05-14 MED FILL — ACYCLOVIR 200 MG CAP: 200 mg | ORAL | Qty: 2

## 2017-05-14 MED FILL — ACETAMINOPHEN 325 MG TABLET: 325 mg | ORAL | Qty: 2

## 2017-05-14 MED FILL — GRANIX 480 MCG/0.8 ML SUBCUTANEOUS SYRINGE: 480 mcg/0.8 mL | SUBCUTANEOUS | Qty: 0.8

## 2017-05-14 MED FILL — VANCOMYCIN 10 GRAM IV SOLR: 10 gram | INTRAVENOUS | Qty: 1250

## 2017-05-14 MED FILL — POTASSIUM CHLORIDE SR 10 MEQ TAB: 10 mEq | ORAL | Qty: 2

## 2017-05-14 MED FILL — ZOLPIDEM 5 MG TAB: 5 mg | ORAL | Qty: 1

## 2017-05-14 MED FILL — DOK PLUS 8.6 MG-50 MG TABLET: ORAL | Qty: 1

## 2017-05-14 MED FILL — BD POSIFLUSH NORMAL SALINE 0.9 % INJECTION SYRINGE: INTRAMUSCULAR | Qty: 40

## 2017-05-14 MED FILL — LEVOFLOXACIN 500 MG TAB: 500 mg | ORAL | Qty: 1

## 2017-05-14 MED FILL — NOXAFIL 100 MG TABLET,DELAYED RELEASE: 100 mg | ORAL | Qty: 3

## 2017-05-14 MED FILL — FIRST-MOUTHWASH BLM 200 MG-25 MG-400 MG-40MG/30ML: 200-25-400-40 mg/30 mL | Qty: 237

## 2017-05-14 MED FILL — GRANIX 300 MCG/0.5 ML SUBCUTANEOUS SYRINGE: 300 mcg/0.5 mL | SUBCUTANEOUS | Qty: 0.5

## 2017-05-14 MED FILL — SODIUM CHLORIDE 0.9 % IV: INTRAVENOUS | Qty: 250

## 2017-05-14 NOTE — Other (Signed)
Bedside shift change report given to Maria Nena D Snak Rn   (oncoming nurse) by Lauren Rn (offgoing nurse). Report included the following information SBAR, Kardex, Intake/Output, MAR and Recent Results.

## 2017-05-14 NOTE — Other (Signed)
Bedside and Verbal shift change report given to Amy RN (oncoming nurse) by Cheryle Horsfall RN (offgoing nurse). Report included the following information SBAR, Kardex, Intake/Output, MAR, Accordion, Recent Results and Cardiac Rhythm ST.

## 2017-05-14 NOTE — Progress Notes (Signed)
Problem: Falls - Risk of  Goal: *Absence of Falls  Document Schmid Fall Risk and appropriate interventions in the flowsheet.  Outcome: Progressing Towards Goal  Fall Risk Interventions:            Medication Interventions: Bed/chair exit alarm, Patient to call before getting OOB, Teach patient to arise slowly         History of Falls Interventions: Bed/chair exit alarm

## 2017-05-14 NOTE — Other (Addendum)
0315 Temp 101.3 tympanic. S/P 2 unit PRBCs. Patient room temperature has been 85+ degrees due to complaints of being cold. Page out to hospitalist. Awaiting return call. Continue to monitor. [place] RN    PAGER ID: [phone]   MESSAGE: [facility] ICU4bed 11-Temp 101.3 s/p 2 unit PRBCs. Patient has been in room with temperature 85+ degrees with door closed due to complaint of being cold. Admitted for pancytopenia. Ext. [phone], Dee RN    0400 Return call from Dr. Valetta Mole. Orders to complete workup for delayed transfusion reaction. Blood bank notified and will begin transfusion reaction process. Will medicate with Tylenol. Continue to monitor. DCJ RN

## 2017-05-14 NOTE — Progress Notes (Signed)
INTERNAL MEDICINE PROGRESS NOTE    Date of note:      May 14, 2017    Patient:               [name] Wu, 57 y.o., female  Admit Date:        05/13/2017  Length of Stay:  1 day(s)    Subjective/24 hr Interval events:     Clinically appears better, no bleeding since admit.     Assessment:     1. Neutropenic fever/severe sepsis (POA)  2. Spontaneous bleeding due to thrombocytopenia  3. Severe pancytopenia  1. Severe thrombocytopenia - 1K on admit  2. Severe leukopenia - 0.1 on admit  3. Severe anemia - Hb 5.6 on admit  4. Abnormal procalcitonin  5. Cannot r/o UTI  6. Hematuria, likely spontaneous due to platelet  7. ALL, on chemotherapy  8. DM 2  9. HTN    Plan:     - Repeat 2 unit pRBC; repeat 2 unit platelet    - Broad spectum IV ABx Vanco/Cefepime, oral noxafil/levaquin (per patient's outpatient Onc recommendations). Cultures NGTD, await another 24 hours to determine ABx course.    - Oncology consulted    - Bleeding has stopped, suspect related to low platelets on admission.    - Transfer to medsurg    - Neutropenic precautions    Objective:     BP Readings from Last 1 Encounters:   05/14/17 170/66     Pulse Readings from Last 1 Encounters:   05/14/17 (!) 108     Resp Readings from Last 1 Encounters:   05/14/17 10     Temp Readings from Last 1 Encounters:   05/14/17 98.4 ??F (36.9 ??C)     '@LASTSAO2' (1)@       Intake/Output Summary (Last 24 hours) at 05/14/2017 1125  Last data filed at 05/14/2017 1100  Gross per 24 hour   Intake 3865.35 ml   Output 1950 ml   Net 1915.35 ml       Physical Exam:     GEN - AAOx3, NAD  HEENT - NC/AT, PERRL, EOMI, mucous membranes moist  Neck - supple, no JVD  Cardiac - RRR, S1, S2, no murmurs  Chest/Lungs - clear to auscultation without wheezes or rhonchi  Abdomen - soft, nontender, nondistended, normal bowel sounds  Extremities - no C/C/E  Neuro - CN 2-12 intact. No focal deficits. No motor or sensory deficit appreciated.   Skin - no rashes or lesions    Current medications:        Current Facility-Administered Medications:   ???  tbo-filgrastim (GRANIX) injection 480 mcg, 480 mcg, SubCUTAneous, DAILY, Tan, Valiant D, MD  ???  0.9% sodium chloride infusion 250 mL, 250 mL, IntraVENous, PRN, Sundra Aland, MD  ???  cefepime (MAXIPIME) 2 g in 0.9% sodium chloride (MBP/ADV) 100 mL MBP, 2 g, IntraVENous, Q8H, Callens, Kate E, PA-C, Last Rate: 200 mL/hr at 05/14/17 1036, 2 g at 05/14/17 1036  ???  oxyCODONE IR (ROXICODONE) tablet 10 mg, 10 mg, Oral, Q4H PRN, Sundra Aland, MD, 10 mg at 05/14/17 1123  ???  potassium chloride SR (KLOR-CON 10) tablet 20 mEq, 20 mEq, Oral, BID, Sundra Aland, MD, 20 mEq at 05/14/17 0851  ???  zolpidem (AMBIEN) tablet 5 mg, 5 mg, Oral, QHS, Sundra Aland, MD, 5 mg at 05/13/17 2138  ???  ondansetron (ZOFRAN) injection 4 mg, 4 mg, IntraVENous, Q6H PRN, Josie Saunders, Anari Evitt, MD  ???  pantoprazole (PROTONIX) 40 mg in  sodium chloride 0.9% 10 mL injection, 40 mg, IntraVENous, Q12H, Josie Saunders, Aja Whitehair, MD, 40 mg at 05/14/17 0851  ???  0.9% sodium chloride infusion, 100 mL/hr, IntraVENous, CONTINUOUS, Sundra Aland, MD, Last Rate: 100 mL/hr at 05/14/17 1037, 100 mL/hr at 05/14/17 1037  ???  sodium chloride (NS) flush 5-10 mL, 5-10 mL, IntraVENous, Q8H, Josie Saunders, Zaylei Mullane, MD, 10 mL at 05/13/17 2351  ???  sodium chloride (NS) flush 5-10 mL, 5-10 mL, IntraVENous, PRN, Sundra Aland, MD  ???  acetaminophen (TYLENOL) tablet 650 mg, 650 mg, Oral, Q4H PRN, 650 mg at 05/14/17 0408 **OR** acetaminophen (TYLENOL) solution 650 mg, 650 mg, Oral, Q4H PRN **OR** acetaminophen (TYLENOL) suppository 650 mg, 650 mg, Rectal, Q4H PRN, Sundra Aland, MD  ???  naloxone (NARCAN) injection 0.1 mg, 0.1 mg, IntraVENous, PRN, Sundra Aland, MD  ???  dextrose (D50W) injection syrg 5-25 g, 10-50 mL, IntraVENous, PRN, Josie Saunders, Xena Propst, MD  ???  glucagon (GLUCAGEN) injection 1 mg, 1 mg, IntraMUSCular, PRN, Sundra Aland, MD  ???  insulin glargine (LANTUS) injection 1-100 Units, 1-100 Units, SubCUTAneous, QHS, Deairra Halleck, MD, 12 Units at 05/13/17 2223   ???  insulin lispro (HUMALOG) injection 1-100 Units, 1-100 Units, SubCUTAneous, AC&HS, Sundra Aland, MD, Stopped at 05/14/17 0730  ???  insulin lispro (HUMALOG) injection 1-100 Units, 1-100 Units, SubCUTAneous, PRN, Sundra Aland, MD  ???  vancomycin (VANCOCIN) 1,250 mg in 0.9% sodium chloride 250 mL IVPB, 1,250 mg, IntraVENous, Q12H, Callens, Verlan Friends, PA-C, Last Rate: 166.7 mL/hr at 05/14/17 1123, 1,250 mg at 05/14/17 1123  ???  [START ON 05/15/2017] Vanco trough due, , Other, ONCE, Callens, Kate E, PA-C  ???  *Vancomycin per Pharmacy Dosing, 1 Each, Other, Rx Dosing/Monitoring, Callens, Verlan Friends, PA-C  ???  acyclovir (ZOVIRAX) capsule 400 mg, 400 mg, Oral, BID, Sundra Aland, MD, 400 mg at 05/14/17 0907  ???  senna-docusate (PERICOLACE) 8.6-50 mg per tablet 1 Tab, 1 Tab, Oral, QID PRN, Knox Saliva, MD, 1 Tab at 05/14/17 1123  ???  posaconazole (NOXAFIL) DR tablet 300 mg (Patient Supplied), 300 mg, Oral, DAILY WITH BREAKFAST, Morris, Derrek Gu, MD  ???  magic mouthwash (FIRST-MOUTHWASH BLM) oral suspension 5 mL, 5 mL, Oral, QID PRN, Knox Saliva, MD  ???  diphenhydrAMINE (BENADRYL) capsule 25 mg, 25 mg, Oral, Q6H PRN, Jasarevic, Muhamed, MD    Labs:     Recent Results (from the past 24 hour(s))   TYPE + CROSSMATCH    Collection Time: 05/13/17 11:45 AM   Result Value Ref Range    Product ID Red Blood Cells      Unit number [id number]      Crossmatch result Compatible      Status info. Transfused      Product code [id number]      Blood type code 6200      Issue date/time 440347425956      Specimen expiration date [id number]     PLATELETS, ALLOCATE    Collection Time: 05/13/17 11:45 AM   Result Value Ref Range    Product ID Platelets      Unit number [id number]      Status info. Transfused      Product code [id number]      Blood type code 6200      Issue date/time 732202542706     BLOOD TYPE, (ABO+RH)    Collection Time: 05/13/17 12:15 PM   Result Value Ref Range    ABO/Rh(D) A Rh Positive     ANTIBODY SCREEN  Collection Time: 05/13/17 12:15 PM   Result Value Ref Range    Antibody screen NEG     TYPE + CROSSMATCH    Collection Time: 05/13/17  1:50 PM   Result Value Ref Range    Product ID Red Blood Cells      Unit number [id number]      Crossmatch result Compatible      Status info. Transfused      Product code [id number]      Blood type code 6200      Issue date/time 630160109323      Specimen expiration date [id number]     PLATELETS, ALLOCATE    Collection Time: 05/13/17  2:14 PM   Result Value Ref Range    Product ID Platelets      Unit number [id number]      Status info. Transfused      Product code [id number]      Blood type code 6200      Issue date/time 062694854627     GLUCOSE, POC    Collection Time: 05/13/17  4:40 PM   Result Value Ref Range    Glucose (POC) 96 65 - 105 mg/dL   GLUCOSE, POC    Collection Time: 05/13/17 10:11 PM   Result Value Ref Range    Glucose (POC) 205 (H) 65 - 105 mg/dL   CBC WITH AUTOMATED DIFF    Collection Time: 05/14/17  4:41 AM   Result Value Ref Range    WBC 0.2 (LL) 4.0 - 11.0 1000/mm3    RBC 2.37 (L) 3.60 - 5.20 M/uL    HGB 6.9 (LL) 13.0 - 17.2 gm/dl    HCT 20.1 (LL) 37.0 - 50.0 %    MCV 84.8 80.0 - 98.0 fL    MCH 29.1 25.4 - 34.6 pg    MCHC 34.3 30.0 - 36.0 gm/dl    PLATELET 37 (LL) 140 - 450 1000/mm3    MPV 9.4 6.0 - 10.0 fL    RDW-SD 48.6 (H) 36.4 - 46.3      NRBC 0 0 - 0      IMMATURE GRANULOCYTES 0.0 0.0 - 3.0 %    NEUTROPHILS 26.6 (L) 34 - 64 %    LYMPHOCYTES 66.7 (H) 28 - 48 %    MONOCYTES 0.0 (L) 1 - 13 %    EOSINOPHILS 6.7 (H) 0 - 5 %    BASOPHILS 0.0 0 - 3 %    Smudge cells 6.7      PLATELET COMMENTS DECREASED     METABOLIC PANEL, COMPREHENSIVE    Collection Time: 05/14/17  4:41 AM   Result Value Ref Range    Sodium 145 136 - 145 mEq/L    Potassium 3.7 3.5 - 5.1 mEq/L    Chloride 113 (H) 98 - 107 mEq/L    CO2 25 21 - 32 mEq/L    Glucose 118 (H) 74 - 106 mg/dl    BUN 10 7 - 25 mg/dl    Creatinine 0.7 0.6 - 1.3 mg/dl    GFR est AA >60.0      GFR est non-AA >60       Calcium 8.4 (L) 8.5 - 10.1 mg/dl    AST (SGOT) 15 15 - 37 U/L    ALT (SGPT) 21 12 - 78 U/L    Alk. phosphatase 70 45 - 117 U/L    Bilirubin, total 1.4 (H) 0.2 - 1.0 mg/dl    Protein, total 5.6 (L) 6.4 -  8.2 gm/dl    Albumin 2.7 (L) 3.4 - 5.0 gm/dl    Anion gap 7 5 - 15 mmol/L   GLUCOSE, POC    Collection Time: 05/14/17  8:59 AM   Result Value Ref Range    Glucose (POC) 121 (H) 65 - 105 mg/dL       XR Results:  Results from [facility] encounter on 05/13/17   XR CHEST PA LAT    Narrative PA and lateral chest    Comparison: None    Indication: Fever    Findings: Chest reveals normal Cardiac silhouette. Lungs are clear of any  failure, acute infiltrates, or other abnormality. Bony structures are normal.  Costophrenic angles are clear.       Impression Impression: Negative chest.         CT Results:  Results from Hospital Encounter encounter on 04/25/17   CTA CHEST W OR W WO CONT    Narrative Indication: Dyspnea. Leukemia. Diabetes. Hypertension. Chemotherapy. Elevated  troponin and chest pressure.      Impression IMPRESSION: No pulmonary embolism. Mild cardiomegaly. Bibasilar discoid  atelectasis.    Comment: 3-D CTA of the chest was performed following administration of  intravenous contrast. Multiplanar PE protocol and MIPS projections were  obtained. The initial report was rendered by [facility] radiology services.    The heart is mildly enlarged. There is no pulmonary embolism or aortic  dissection. The aorta is of normal caliber.    No pleural or pericardial effusion.    There is dependent subsegmental atelectasis at both lung bases. No pulmonary  consolidation or mass.    No adenopathy.         MRI Results:  No results found for this or any previous visit.    Nuclear Medicine Results:  No results found for this or any previous visit.    [place] Results:  No results found for this or any previous visit.    IR Results:  No results found for this or any previous visit.    VAS/US Results:   Results from Hospital Encounter encounter on 04/25/17   DUPLEX LOWER EXT VENOUS BILAT    Narrative                                                              Study ID: [id number]                                                [facility]  [place],                                         [place]                                  Lower Extremity Venous Report    Name: THEDA, PAYER            Study Date: 04/25/2017 08:46 AM  MRN: [id number]                     Patient Location: CD06  DOB: Apr 01, 1961                  Age: 38 yrs  Gender: Female                   Account #: [id number]  Reason For Study: Elevated D-dimer  Ordering Physician: Meredeth Ide  Performed By: Wannetta Sender    Interpretation Summary  No evidence of acute superficial or deep vein thrombosis noted in either  lower extremity .    _____________________________________________________________________________  Verneda Skill  Complete bilateral venous duplex performed. ICD 10: R78.87.    HISTORY/SYMPTOMS  Cancer. Elevated d-dimer.    RIGHT LEG  The right common femoral, femoral, popliteal, posterior tibial and peroneal  veins were examined with duplex ultrasound. The deep veins were patent and  compressible with no evidence of intraluminal thrombus. Spontaneous, phasic  venous flow with normal augmentation was noted throughout the right leg.  RIGHT SAPHENOUS VEINS  Compression of the right proximal greater saphenous vein is complete.  Spectral Doppler exam demonstrates normal and spontaneous venous flow in the  right proximal greater saphenous vein.    LEFT LEG   The left common femoral, femoral, popliteal, posterior tibial and peroneal  veins were examined with duplex ultrasound. The deep veins were patent and  compressible with no evidence of intraluminal thrombus. Spontaneous, phasic  venous flow with normal augmentation was noted throughout the left leg.    LEFT LEG SAPHENOUS VEINS  Compression of the left proximal greater saphenous vein is complete. Spectral  Doppler exam demonstrates normal and spontaneous venous flow in the left  proximal greater saphenous vein.      Electronically signed byDr. Judithe Modest, M.D   04/25/2017 05:31 PM  Total Clinical time 35 minutes, including examining patient, review of current and past medical history in chart (labs, imaging, notes) and extensive discussion with family.       Sundra Aland, MD  Hospitalist  [facility]  May 14, 2017  11:25 AM

## 2017-05-14 NOTE — Progress Notes (Signed)
NUTRITION RECOMMENDATIONS:   ?? Rec adjusting diet to NDD1 w/ Diabetic with 4 portions w/ Chocolate Glucerna TID and Magic Cup, No Sugar Added, BID     ?? Will honor snack preferences     NUTRITION INITIAL EVALUATION    NUTRITION ASSESSMENT:       Reason for assessment: Verbal Consult per RN    Admitting diagnosis: Neutropenic fever ([place])  Thrombocytopenia ([place])     PMH:   Past Medical History:   Diagnosis Date   ??? Diabetes ([place])    ??? Hx antineoplastic chemotherapy    ??? Hypertension    ??? Leukemia ([place])        Code Status: Full Code      Anthropometrics:Height:   Ht Readings from Last 3 Encounters:   05/13/17 5' (1.524 m)   04/25/17 5' (1.524 m)   04/24/17 5' (1.524 m)       Weight:   Wt Readings from Last 3 Encounters:   05/14/17 85.7 kg (188 lb 15 oz)   04/26/17 83 kg (182 lb 15.7 oz)   04/24/17 77.1 kg (170 lb)      ?? IBW: 45 kg    ?? % IBW: 190%    ?? BMI: Body mass index is 36.9 kg/m??.    ?? UBW: 76 kg    ?? Wt change: pt reports a 13.6 kg wt loss x  ~1-week; wt hx not consistent with reported wt loss         []  significant       []  not significant         []  intended         []  not intended Diet and intake history:?? Current diet order: DIET NUTRITIONAL SUPPLEMENTS All Meals; Ensure Enlive  ?? DIET PUREED High Pro/Cal Protein; Neutropenic    ?? Food allergies: soy, gluten, unable to drink milk but can tolerate milk-containing products    ?? Diet/intake history: consumes 6 small meals a day + 2 protein premiers. After chemo, pt usually starts to have a poor appetite sec to  mouth sores & dysgeusia (diminished taste buds and/or heightened metallic taste) for up to 2-weeks after tx's. Pt will therefore substitute a meal with a protein primer.           [x]  <50% intake x >5 days        []  <50% intake x >1 month        []  <75% intake x >7 days         []  <75% intake x 1 month         []  <75% intake x 3 months    ?? Current appetite/PO intake:         []    N/A- NPO        []    Very poor (<25% of meals)         []    Poor (<50% of meals)        []    Fair (50-75% of meals)         []    Good (>75% of meals)    Assessment of current MNT: Diet is adequate if intake averages 75% at most meals - recommend adding oral supplement x5, to increase protein -calorie intake opportunity.     ?? Cultural, religious, and ethnic food preferences identified: none   Physical Assessment:?? GI symptoms: LBM PTA    ?? Chewing/swallowing issues: after chemo, pt will usually start to experience mouth sores for  up to 2-weeks after tx's. Pt requesting pureed diet d/t healing mouth sores    ?? Skin integrity: Braden scale 23-no risk     ?? Muscle wasting:  [x]  none  []  mild:  []  moderate:  []  severe:    ?? Fat wasting:  [x]  none  []  mild:  []  moderate:  []  severe:    ?? Fluid accumulation: none noted   ?? Mental status: A&O x4    ?? Other: Intake and output:    Intake/Output Summary (Last 24 hours) at 05/14/2017 1303  Last data filed at 05/14/2017 1200  Gross per 24 hour   Intake 4115.35 ml   Output 2250 ml   Net 1865.35 ml     Estimated daily nutrition intake needs:  ?? ~1215 - 1575 kcals (27-35 kcal/kg IBW)    ?? ~85 - 105 g protein (1.0-1.2 g/kg BW)    ?? ~2140-2570 ml fluid (25-30 mL/kg BW)     Living situation: []  alone, little/no support    []  alone, family nearby/supportive      [x]  with family/caregiver     []  in NH/SNF     []  homeless    Current pertinent medications: lantus, SSI, protonix, KCI, vanco, granix, IVF @ 100 mL/hr    Pertinent labs: 1/2  BS 118-WNL; on Glucommander  Corrected Cal 9.4-WNL  H&H- s/p PRBC  Procal 0.63-leukemia/neutropenic fever   HbA1c 6.4     Does patient meet ASPEN/AND criteria for malnutrition diagnosis: no (meets only one out of two needed criteria for malnutrition diagnosis)    NUTRITION DIAGNOSIS:     1. 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    NUTRITION INTERVENTION:   Recommended diet: NDD1 w/ Diabetic with 4 portions  ??   Recommended nutrition supplement: Chocolate Glucerna TID (provides 220 kcals, 10 g protein each) and Magic Cup, No Sugar Added, BID (provides 250 kcals, 9 g protein each)    NUTRITION MONITORING AND EVALUATION:     Nutrition level of care:  []  low       [x]  moderate      []  high    Nutrition monitoring: PO intake, nutrition-related labs, wt, medical change of condition    Nutrition goals:  PO intake >75%, nutrition-related labs WNL, wt stable through LOS    NUTRITION EDUCATION:   Diet instructed on: Oncology Nutrition Therapy w/ focus on tips for managing mouth sores/dysguesia     Person(s) educated:  [x]  patient  []  family/signficant other  []  guardian/caregiver  []  other:  Education modifications:   []  visual (font size)     []  language     []  age appropriate  []  other:    Barriers to learning/intervention:   []  emotional     []  desire/motivation      []  financial  []  physical and/or cognitive barriers     []  other:   Method of Instruction:   [x]  verbal      [x]  written         [x]  teachback       []  interpreter:          []  other:     Patient comprehension:    []  poor     [x]  fair    [x]  good    Expected compliance:   []  poor      [x]  fair        [x]  good  Wallie Renshaw, RD   Pager # [phone]  05/14/17

## 2017-05-14 NOTE — Progress Notes (Signed)
TRANSFER - OUT REPORT:    Verbal report given to [name] Baas, RN(name) on [name] Wu  being transferred to 5 east(unit) for routine progression of care       Report consisted of patient???s Situation, Background, Assessment and   Recommendations(SBAR).     Information from the following report(s) SBAR, Kardex, ED Summary, OR Summary, Procedure Summary, Intake/Output, MAR and Recent Results was reviewed with the receiving nurse.    Lines:   Peripheral IV 05/13/17 Left Antecubital (Active)   Site Assessment Clean, dry, & intact 05/14/2017  8:00 AM   Phlebitis Assessment 0 05/14/2017  8:00 AM   Infiltration Assessment 0 05/14/2017  8:00 AM   Dressing Status Clean, dry, & intact 05/14/2017  8:00 AM   Dressing Type Transparent 05/14/2017  8:00 AM   Hub Color/Line Status Pink;Flushed 05/14/2017  8:00 AM   Action Taken Open ports on tubing capped 05/14/2017  4:01 AM   Alcohol Cap Used Yes 05/14/2017  8:00 AM       Peripheral IV 05/13/17 Left;Distal Forearm (Active)   Site Assessment Clean, dry, & intact 05/14/2017  8:00 AM   Phlebitis Assessment 0 05/14/2017  8:00 AM   Infiltration Assessment 0 05/14/2017  8:00 AM   Dressing Status Clean, dry, & intact 05/14/2017  8:00 AM   Dressing Type Transparent 05/14/2017  8:00 AM   Hub Color/Line Status Pink;Infusing 05/14/2017  8:00 AM   Action Taken Open ports on tubing capped 05/14/2017  8:00 AM   Alcohol Cap Used Yes 05/14/2017  8:00 AM        Opportunity for questions and clarification was provided.      Patient transported with:   Patient-specific medications from Pharmacy  [facility]

## 2017-05-14 NOTE — Progress Notes (Addendum)
0532 Critical lab values from lab. Oncall hospitalist paged with results. Awaiting return call. Kalona RN    PAGER ID: [phone]   MESSAGE: [facility] ICU4 bed 11-HGB/HCT 6.9/20.1. Platelets 37. No active bleeding noted. Admitted for pancytopenia. Ext [id number], Dee RN    [phone] Received return call from Dr. Valetta Mole. No new orders received. Will discuss with Oncologist on rounds. Continue to monitor. DCJ RN

## 2017-05-15 LAB — TYPE + CROSSMATCH
Blood type code: 6200
Blood type code: 6200
Issue date/time: 201901021518
Issue date/time: 201901021840
Specimen expiration date: 201901052359
Specimen expiration date: 201901052359
Status info.: TRANSFUSED
Status info.: TRANSFUSED

## 2017-05-15 LAB — GLUCOSE, POC
Glucose (POC): 143 mg/dL — ABNORMAL HIGH (ref 65–105)
Glucose (POC): 118 mg/dL — ABNORMAL HIGH (ref 65–105)
Glucose (POC): 164 mg/dL — ABNORMAL HIGH (ref 65–105)

## 2017-05-15 LAB — CBC WITH AUTOMATED DIFF
EOSINOPHILS: 6.3 % — ABNORMAL HIGH (ref 0–5)
HCT: 27.1 % — ABNORMAL LOW (ref 37.0–50.0)
HGB: 9.2 gm/dl — ABNORMAL LOW (ref 13.0–17.2)
IMMATURE GRANULOCYTES: 0 % (ref 0.0–3.0)
LYMPHOCYTES: 87.5 % — ABNORMAL HIGH (ref 28–48)
MCH: 28.8 pg (ref 25.4–34.6)
MCHC: 33.9 gm/dl (ref 30.0–36.0)
MCV: 85 fL (ref 80.0–98.0)
MPV: 9.3 fL (ref 6.0–10.0)
NEUTROPHILS: 6.2 % — ABNORMAL LOW (ref 34–64)
NRBC: 0 (ref 0–0)
PLATELET COMMENTS: DECREASED
PLATELET: 79 10*3/uL — ABNORMAL LOW (ref 140–450)
RBC Morphology: NORMAL
RBC: 3.19 M/uL — ABNORMAL LOW (ref 3.60–5.20)
RDW-SD: 47.8 — ABNORMAL HIGH (ref 36.4–46.3)
WBC: 0.2 10*3/uL — CL (ref 4.0–11.0)

## 2017-05-15 LAB — HGB & HCT
HCT: 26 % — ABNORMAL LOW (ref 37.0–50.0)
HGB: 9 gm/dl — ABNORMAL LOW (ref 13.0–17.2)

## 2017-05-15 MED ORDER — POLYETHYLENE GLYCOL 3350 17 GRAM (100 %) ORAL POWDER PACKET
17 gram | Freq: Once | ORAL | Status: AC
Start: 2017-05-15 — End: 2017-05-15
  Administered 2017-05-15: 11:00:00 via ORAL

## 2017-05-15 MED FILL — PANTOPRAZOLE 40 MG IV SOLR: 40 mg | INTRAVENOUS | Qty: 40

## 2017-05-15 MED FILL — BD POSIFLUSH NORMAL SALINE 0.9 % INJECTION SYRINGE: INTRAMUSCULAR | Qty: 10

## 2017-05-15 MED FILL — VANCOMYCIN 10 GRAM IV SOLR: 10 gram | INTRAVENOUS | Qty: 1250

## 2017-05-15 MED FILL — POLYETHYLENE GLYCOL 3350 17 GRAM (100 %) ORAL POWDER PACKET: 17 gram | ORAL | Qty: 1

## 2017-05-15 MED FILL — ACYCLOVIR 200 MG CAP: 200 mg | ORAL | Qty: 2

## 2017-05-15 MED FILL — OXYCODONE 5 MG TAB: 5 mg | ORAL | Qty: 2

## 2017-05-15 MED FILL — ZOLPIDEM 5 MG TAB: 5 mg | ORAL | Qty: 1

## 2017-05-15 MED FILL — POTASSIUM CHLORIDE SR 10 MEQ TAB: 10 mEq | ORAL | Qty: 2

## 2017-05-15 MED FILL — GRANIX 480 MCG/0.8 ML SUBCUTANEOUS SYRINGE: 480 mcg/0.8 mL | SUBCUTANEOUS | Qty: 0.8

## 2017-05-15 MED FILL — CEFEPIME 2 GRAM SOLUTION FOR INJECTION: 2 gram | INTRAMUSCULAR | Qty: 2

## 2017-05-15 MED FILL — ACETAMINOPHEN 325 MG TABLET: 325 mg | ORAL | Qty: 2

## 2017-05-15 MED FILL — DIPHENHYDRAMINE HCL 50 MG/ML IJ SOLN: 50 mg/mL | INTRAMUSCULAR | Qty: 1

## 2017-05-15 MED FILL — LEVOFLOXACIN 500 MG TAB: 500 mg | ORAL | Qty: 1

## 2017-05-15 NOTE — Discharge Summary (Signed)
Discharge Summary by Sundra Aland, MD at 05/15/17 1212                Author: Sundra Aland, MD  Service: Hospitalist  Author Type: Physician       Filed: 05/16/17 1410  Date of Service: 05/15/17 1212  Status: Signed          Editor: Sundra Aland, MD (Physician)                  Discharge Summary                   Patient ID:   [name] Wu, 57 y.o.,  female   DOB: 07/22/1960      PCP:  Lacey Jensen, MD      Admit Date: 05/13/2017  6:57 AM   Discharge Date:  05/15/2017  2:00 PM   Length of stay: 2 day(s)   Discharge Disposition: Home or Self Care          Chief Complaint       Patient presents with        ?  Fever        ?  Cough           Discharge Diagnosis:    1.  Neutropenic fever/severe sepsis (POA) - CULTURES NEGATIVE   2.  Spontaneous bleeding due to thrombocytopenia   3.  Severe pancytopenia   1.  Severe thrombocytopenia - 1K on admit   2.  Severe leukopenia - 0.1 on admit   3.  Severe anemia - Hb 5.6 on admit   4.  UTI Ruled out   5.  Hematuria, likely spontaneous due to platelet   6.  ALL, on chemotherapy   7.  DM 2   8.  HTN      CONCERNS WHICH NEED CLOSE FOLLOW-UP:        Follow-up Information               Follow up With  Specialties  Details  Why  Contact Info              [name], MD  Internal Medicine, Hematology, Oncology  Call  office will call , for follow-up appointment withy our Oncology   [address]   [address]   [facility]   [place] [address]   [phone]                 Transitional [facility] Clinic    Go on 05/22/2017  Follow up appointment January 10 at 8:45AM.  Bring ID, insurance card and all medications  [place].   [facility])   [place]   [phone]                HPI on Admission (per admitting physician):   Patient is a very pleasant 57 year old African American female, she has a history of known acute lymphoblastic leukemia. She presents to hospital with chief complaint  of blood in urine. She was admitted to  hospital roughly 2 weeks ago for evaluation of shortness of breath. She was discharged without any clear findings. She is currently receiving chemotherapy and oncology care in [place] at [facility] [name].  She last received her chemotherapy dose on 12/25. She will return to [place] on 12/26 and has since felt unwell, weakness, anorexia, poor by mouth intake. She has noticed some blood in her sputum after cough, did notice some darkness  to her stools.  Yesterday she had onset of bright red blood in the urine which alarmed her so she presented to the emergency room. In the emergency room CBC showed severe pancytopenia with a platelet count of 1000. She was also noted to spike a fever in the emergency  room. Patient without diarrhea, no burning on urination, no fever or chills at home. No productive cough. No sick contacts. Her next chemotherapy is scheduled for either January 10 or January 18 and back in [facility] Course:     Patient admitted to hospital with fever and blood in urine. On admission to hospital she was febrile and markedly pancytopenic. On admission it was suspected that this is likely related to recent chemotherapy on 12/25, however she was admitted for 48  hours for evaluation. She was started on broad-spectrum antibiotics. By the time of discharge culture remained negative and no source of fever found. She was markedly pancytopenic, she was followed by in-house oncology. Patient transfused packed blood  cells as well as multiple bags of platelets. These counts have improved. Patient had neutropenia, however with discussion with oncology she did not wish to take further GSF stimulating factors at this time. She prefers to follow up with [facility] [name] whom she follows with in [place], and frequent travels back and forth. Attempt to discuss with her oncologist in [place] was made, however no return call received. Stable for discharge with outpatient follow-up.       Condition at discharge:   Afebrile   Ambulating   Eating, Drinking, Voiding   Stable      Operative Procedures:     None.      Consultants/Treatment Team:     Treatment Team: Consulting Provider: Other, Phys, MD; Consulting Provider: Sundra Aland, MD; Consulting Provider: Dorrene German, MD      Physical Exam on Discharge:   Visit Vitals      BP  136/82 (BP 1 Location: Right arm, BP Patient Position: Sitting)     Pulse  (!) 106     Temp  98.8 ??F (37.1 ??C)     Resp  16     Ht  5' (1.524 m)     Wt  85.7 kg (188 lb 15 oz)     SpO2  100%     Breastfeeding?  No        BMI  36.90 kg/m??        Gen - AAOx3, NAD   HEENT - NC/AT, PERRL, EOMI, mucous membranes moist   Neck - supple, no JVD   Cardiac - RRR, S1, S2, no murmurs   Chest/Lungs - clear to auscultation without wheezes or rhonchi   Abdomen - soft, nontender, nondistended, normal bowel sounds   Extremities - no C/C/E   Neuro - CN 2-12 intact. No motor or sensory deficit.   Skin - no rashes or lesions        Discharge Medication List as of 05/15/2017  1:29 PM              CONTINUE these medications which have NOT CHANGED          Details        famotidine (PEPCID) 20 mg tablet  Take 20 mg by mouth two (2) times a day., Historical Med               prochlorperazine (COMPAZINE) 10 mg tablet  Take 5 mg by mouth every  six (6) hours as needed., Historical Med               polyethylene glycol (MIRALAX) 17 gram/dose powder  Take 17 g by mouth daily., Historical Med               oxyCODONE IR (ROXICODONE) 5 mg immediate release tablet  Take 2 Tabs by mouth every four (4) hours as needed. Max Daily Amount: 60 mg., Print, Disp-60 Tab, R-0               acyclovir (ZOVIRAX) 400 mg tablet  Take 400 mg by mouth two (2) times a day., Historical Med               tbo-filgrastim (GRANIX) 480 mcg/0.8 mL syrg injection  480 mcg by SubCUTAneous route once., Historical Med               insulin lispro (HUMALOG JUNIOR KWIKPEN U-100) 100 unit/mL inph  by SubCUTAneous route three (3) times  daily., Historical Med               potassium chloride SR (K-TAB) 20 mEq tablet  Take 20 mEq by mouth two (2) times a day., Historical Med               levoFLOXacin (LEVAQUIN) 500 mg tablet  Take 500 mg by mouth daily., Historical Med               posaconazole (NOXAFIL) 100 mg DR tablet  Take 300 mg by mouth daily (with breakfast)., Historical Med               insulin degludec (TRESIBA FLEXTOUCH U-200) 200 unit/mL (3 mL) inpn  20 Units by SubCUTAneous route daily., Historical Med               zolpidem (AMBIEN) 5 mg tablet  Take 5 mg by mouth nightly., Historical Med                     STOP taking these medications                  dasatinib (SPRYCEL) 140 mg tab  Comments:    Reason for Stopping:                             Most Recent Labs:   No results found for this or any previous visit (from the past 24 hour(s)).         XR Results:     Results from Hospital Encounter encounter on 05/13/17     XR CHEST PA LAT           Narrative  PA and lateral chest      Comparison: None      Indication: Fever      Findings: Chest reveals normal Cardiac silhouette. Lungs are clear of any   failure, acute infiltrates, or other abnormality. Bony structures are normal.   Costophrenic angles are clear.               Impression  Impression: Negative chest.              CT Results:     Results from Hospital Encounter encounter on 04/25/17     CTA CHEST W OR W WO CONT           Narrative  Indication: Dyspnea. Leukemia. Diabetes. Hypertension. Chemotherapy. Elevated  troponin and chest pressure.              Impression  IMPRESSION: No pulmonary embolism. Mild cardiomegaly. Bibasilar discoid   atelectasis.      Comment: 3-D CTA of the chest was performed following administration of   intravenous contrast. Multiplanar PE protocol and MIPS projections were   obtained. The initial report was rendered by [facility] radiology services.      The heart is mildly enlarged. There is no pulmonary embolism or aortic   dissection. The aorta  is of normal caliber.      No pleural or pericardial effusion.      There is dependent subsegmental atelectasis at both lung bases. No pulmonary   consolidation or mass.      No adenopathy.              MRI Results:   No results found for this or any previous visit.      Nuclear Medicine Results:   No results found for this or any previous visit.      [place] Results:   No results found for this or any previous visit.      IR Results:   No results found for this or any previous visit.      VAS/US Results:     Results from Hospital Encounter encounter on 04/25/17     DUPLEX LOWER EXT VENOUS BILAT           Narrative                                                               Study ID: [id number]                                                     [facility]                                             [address] [employer]  [place],                                          [place]                                       Lower Extremity Venous Report      Name: SENTORIA, BRENT Date: 04/25/2017 08:46 AM   MRN: [id number]                     Patient Location: CD06   DOB: 01/11/1961                  Age: 81 yrs   Gender: Female                   Account #: [id number]   Reason For Study: Elevated D-dimer   Ordering Physician: Meredeth Ide   Performed By: Wannetta Sender      Interpretation Summary   No evidence of acute superficial or deep vein thrombosis noted in either   lower extremity .      _____________________________________________________________________________   Verneda Skill   Complete bilateral venous duplex performed. ICD 10: R78.87.      HISTORY/SYMPTOMS   Cancer. Elevated d-dimer.      RIGHT LEG   The right  common femoral, femoral, popliteal, posterior tibial and peroneal   veins were examined with duplex ultrasound. The deep veins were patent and   compressible with no evidence of intraluminal thrombus. Spontaneous, phasic   venous flow with normal augmentation was noted throughout the right leg.   RIGHT SAPHENOUS VEINS   Compression of the right proximal greater saphenous vein is complete.   Spectral Doppler exam demonstrates normal and spontaneous venous flow in the   right proximal greater saphenous vein.      LEFT LEG   The left common femoral, femoral, popliteal, posterior tibial and peroneal   veins were examined with duplex ultrasound. The deep veins were patent and   compressible with no evidence of intraluminal thrombus. Spontaneous, phasic   venous flow with normal augmentation was noted throughout the left leg.      LEFT LEG SAPHENOUS VEINS   Compression of the left proximal greater saphenous vein is complete. Spectral   Doppler exam demonstrates normal and spontaneous venous flow in the left   proximal greater saphenous vein.         Electronically signed byDr. Judithe Modest, M.D   04/25/2017 05:31 PM                 Total discharge time 45 minutes.          Sundra Aland, MD   Hospitalist   [facility]   May 16, 2017   12:13 PM

## 2017-05-15 NOTE — Progress Notes (Signed)
Bedside and Verbal shift change report given to [place] (oncoming nurse) by Jobie Quaker RN (offgoing nurse). Report included the following information SBAR and Kardex.

## 2017-05-15 NOTE — Progress Notes (Signed)
NUTRITION glycemic control evaluation    RD consult for DM diet education - for full initial RD assessment see RD note 1/2     NUTRITION RECOMMENDATIONS: D/C "pureed" diet texture - pt did not tolerate yesterday, is able to tolerate reg textures and is requesting diet adjustment. Note intolerance to soy and gluten. Also avoids fluid milk to drink, but can tolerate milk containing products.    Education history: Has seen RD in the past for DM diet education, has had DM for past 10 or so years     Current diet order: DIET NUTRITIONAL SUPPLEMENTS Lunch, Holiday representative; Optician, dispensing  DIET NUTRITIONAL SUPPLEMENTS All Meals; Glucerna Shake  DIET DIABETIC WITH OPTIONS 4 Portions, Med; Neutropenic    Diet history: 6 small meals per day + Premier protein shake x2; diminished appetite s/p chemo 2/2 mouth sores and dysgeusia (pt still currently c/o metallic taste with foods), pt may substitute meal with Premier Protein shake if not eating well    [name] po intake much improved yesterday - ate full breakfast (cream of wheat x2, Ensure Enlive shake, yogurt, apple juice), disliked and did not tolerate pureed food, was brought [employer] from outside hospital - tried to eat small order of french fries - tasted metallic, but was able to tolerate chicken tortilla soup. Tried [employer] (vanilla) in the past, but thinks it tasted "like paste," may be willing to try alternate flavor (if available).    Compliance history:    [x]  Compliant     []  Inconsistent/limited compliance     []  Noncompliant  []  Unknown     Biochemical data:  ?? BS at hospital: Glu 1/2-1/3: range of 118-181 mg/dl   HgbA1c:   Lab Results   Component Value Date/Time    Hemoglobin A1c 6.4 (H) 04/25/2017 04:55 AM   ??   ?? Average BS at home: Pt reports her BG levels vary since dx leukemia and chemo tx; however, avg reading in AM is 110 mg/dl, avg reading at night is 130 mg/dl (checks BG regularly, at least once at night time, if not twice  per day). Compliant with insulin regimen at home.    Current meds affecting BS:   []  Insulin (drip)  [x]  Insulin (subq)  []  Insulin (pump)  []  Oral meds  []  Steroids  []  IV dextrose fluids  []  Other:    Home meds affecting BS:   [x]  Insulin (long acting)  [x]  Insulin (short/intermediate acting)  []  Insulin (pump)  []  Oral meds  []  Steroids  []  Other:  Education needed:  [x]  yes - although, DM diet education is mostly review for patient, is knowledgeable about DM diet guidelines  []  no______________________________________________________________________  Diet instructed on: CCHO diet     Person(s) educated:  [x]  patient  []  family/signficant other  []  guardian/caregiver  []  other:  Education modifications:   []  visual (font size)     []  language     []  age appropriate  []  other:    Barriers to learning/intervention:   []  emotional     []  desire/motivation      []  financial  []  physical and/or cognitive barriers     []  other:   Method of Instruction:   [x]  verbal      [x]  written         []  teachback       []  interpreter:          []  other:     Patient comprehension:    []   poor     []  fair    [x]  good    Expected compliance:   []  poor      []  fair        [x]  good       Murlean Hark, RD  05/15/17

## 2017-05-15 NOTE — Other (Addendum)
----------DocumentID: [id number]------------------------------------------------              [facility]                       Patient Education Report         Name: [name] Wu, [name] Wu                  Date: 05/13/2017    MRN: [id number]                    Time: 6:42:06 PM         Patient ordered video: 'Patient Safety: Stay Safe While you are in the Hospital'    from [id number] via phone number: [phone] at 6:42:06 PM    Description: This program outlines some of the precautions patients can take to ensure a speedy recovery without extra complications. The video emphasizes the importance of communicating with the healthcare team.    ----------DocumentID: [id number]------------------------------------------------              [facility]                       Patient Education Report         Name: [name] Wu, [name] Wu                  Date: 05/14/2017    MRN: [id number]                    Time: 12:51:48 PM         Patient ordered video: 'Patient Safety: Stay Safe While you are in the Hospital'    from [id number] via phone number: [phone] at 12:51:48 PM    Description: This program outlines some of the precautions patients can take to ensure a speedy recovery without extra complications. The video emphasizes the importance of communicating with the healthcare team.    ----------DocumentID: [id number]------------------------------------------------              [facility]                       Patient Education Report         Name: [name] Wu, [name] Wu                  Date: 05/14/2017    MRN: [id number]                    Time: 1:11:33 PM         Patient ordered video: 'Patient Safety: Stay Safe While you are in the Hospital'    from [id number] via phone number: [phone] at 1:11:33 PM    Description: This program outlines some of the precautions patients can take to ensure a speedy recovery without extra complications. The video  emphasizes the importance of communicating with the healthcare team.    ----------DocumentID: [id number]------------------------------------------------                       [facility]          Patient Education Report - Discharge Summary        Date: 05/15/2017   Time: 1:54:09 PM   Name: [name] Wu, [name] Wu   MRN: [id number]      Account Number: [id number]      Education History:        Patient ordered  video: 'Patient Safety: Stay Safe While you are in the Hospital' from [id number] on 05/13/2017 06:42:06 PM    Patient ordered video: 'Patient Safety: Stay Safe While you are in the Hospital' from [id number] on 05/14/2017 12:51:48 PM    Patient ordered video: 'Patient Safety: Stay Safe While you are in the Hospital' from [id number] on 05/14/2017 01:11:33 PM

## 2017-05-15 NOTE — Progress Notes (Signed)
Hematology / Oncology Progress Note  [facility]        Patient: [name] Wu  Gender: female   MRN: [id number]    Date of Birth: [birth year] Age: 57 y.o.   CSN: [id number]    LOS:  LOS: 2 days   Admit Date: 05/13/2017    Assessment:     Active Problems:    Thrombocytopenia ([place]) (05/13/2017)      Neutropenic fever ([place]) (05/13/2017)        Plan:     Mucositis- better.  Low plts- also recovering.  Neutropenia- had neulasta. Does not want neupogen, will DC.  Pt insists on going home.  If so, will cont po levaquin at home.  She will go back to [facility] for her transplant soon.    Subjective:     Admitted for low plt, mucositis and NF. Now feels better.    Objective:     Visit Vitals  BP 129/66 (BP 1 Location: Right arm, BP Patient Position: Supine)   Pulse (!) 104   Temp 98.8 ??F (37.1 ??C)   Resp 17   Ht 5' (1.524 m)   Wt 85.7 kg (188 lb 15 oz)   SpO2 97%   Breastfeeding? No   BMI 36.90 kg/m??             Temp (24hrs), Avg:98.9 ??F (37.2 ??C), Min:98.3 ??F (36.8 ??C), Max:99.6 ??F (37.6 ??C)        Intake/Output Summary (Last 24 hours) at 05/15/2017 0910  Last data filed at 05/15/2017 0610  Gross per 24 hour   Intake 2749.6 ml   Output 300 ml   Net 2449.6 ml       Review of Systems:   Constitutional: had fevers, chills, sweats and fatigue. Now better  Eyes: negative for visual disturbance, redness and icterus  Ears, Nose, Mouth, Throat, and Face: negative for tinnitus, epistaxis, sore mouth and hoarseness  Respiratory: negative for cough, sputum, hemoptysis, pleurisy/chest pain or wheezing  Cardiovascular: negative for chest pain, chest pressure/discomfort, palpitations, irregular heart beats, syncope, paroxysmal nocturnal dyspnea  Gastrointestinal: negative for reflux symptoms, nausea, vomiting, change in bowel habits, melena, diarrhea, constipation and abdominal pain  Genitourinary:negative for dysuria, nocturia, urinary incontinence, hesitancy. No more hematuria   Integument: negative for rash, skin lesion(s) and pruritus  Hematologic/Lymphatic: negative for easy bruising, bleeding and lymphadenopathy  Musculoskeletal:negative for myalgias, arthralgias and bone pain  Neurological: negative for headaches, dizziness, seizures, paresthesia and weakness    Physical Exam:  Constitutional: Alert, oriented, not in distress  Eyes: PERRLA, anicteric, no redness  Ears, nose, mouth, throat, and face: no palpable Lymph nodes, no mucositis, no thrush.  Respiratory: symmetrical expansion, no rales, no rhonchi, no wheezing.  Cardiovascular: S1S2, no pathologic murmur, no rub.  Gastrointestinal: soft, benign, non tender, no HSM, normal bowel sounds, no mass.  Integument: no rash, no petechiae, no ecchymosis.  Musculoskeletal: no edema, no cyanosis, no clubbing.  Neurological: intact, cranial nerves, no focal motor or sensory deficits.    Labs:  Basic Metabolic Profile   Recent Labs     05/14/17  0441 05/13/17  0833   NA 145 141   K 3.7 3.6   CL 113* 108*   CO2 25 26   BUN 10 13   CREA 0.7 0.8   GLU 118* 151*   CA 8.4* 8.9        CBC w/Diff    Recent Labs     05/15/17  0557 05/14/17  2343 05/14/17  0441 05/13/17  0950   WBC 0.2*  --  0.2* 0.1*   HGB 9.2* 9.0* 6.9* 5.6*   HCT 27.1* 26.0* 20.1* 16.1*   PLT 79*  --  37* 1*    Recent Labs     05/15/17  0557 05/14/17  0441 05/13/17  0950   GRANS 6.2* 26.6* 9.1*        CHEMISTRY   Lab Results   Component Value Date    ALB 2.7 (L) 05/14/2017    TP 5.6 (L) 05/14/2017    TBILI 1.4 (H) 05/14/2017    ALT 21 05/14/2017    SGOT 15 05/14/2017    AP 70 05/14/2017         Coagulation   Lab Results   Component Value Date/Time    Prothrombin time 14.1 (H) 05/13/2017 08:33 AM    Prothrombin time 12.5 04/25/2017 07:04 AM    INR 1.2 (H) 05/13/2017 08:33 AM    INR 1.1 04/25/2017 07:04 AM          Current Medications:   Current Facility-Administered Medications:   ???  tbo-filgrastim (GRANIX) injection 480 mcg, 480 mcg, SubCUTAneous, DAILY, Tayquan Gassman D, MD   ???  0.9% sodium chloride infusion 250 mL, 250 mL, IntraVENous, PRN, Josie Saunders, Anuj, MD  ???  levoFLOXacin (LEVAQUIN) tablet 500 mg, 500 mg, Oral, Q24H, Josie Saunders, Anuj, MD, 500 mg at 05/14/17 1215  ???  diphenhydrAMINE (BENADRYL) injection 12.5 mg, 12.5 mg, IntraVENous, Q4H PRN, Sundra Aland, MD  ???  cefepime (MAXIPIME) 2 g in 0.9% sodium chloride (MBP/ADV) 100 mL MBP, 2 g, IntraVENous, Q8H, Callens, Kate E, PA-C, Last Rate: 200 mL/hr at 05/15/17 0235, 2 g at 05/15/17 0235  ???  oxyCODONE IR (ROXICODONE) tablet 10 mg, 10 mg, Oral, Q4H PRN, Sundra Aland, MD, 10 mg at 05/15/17 0544  ???  potassium chloride SR (KLOR-CON 10) tablet 20 mEq, 20 mEq, Oral, BID, Sundra Aland, MD, 20 mEq at 05/14/17 1754  ???  zolpidem (AMBIEN) tablet 5 mg, 5 mg, Oral, QHS, Sundra Aland, MD, 5 mg at 05/14/17 2136  ???  ondansetron (ZOFRAN) injection 4 mg, 4 mg, IntraVENous, Q6H PRN, Sundra Aland, MD  ???  pantoprazole (PROTONIX) 40 mg in sodium chloride 0.9% 10 mL injection, 40 mg, IntraVENous, Q12H, Josie Saunders, Anuj, MD, 40 mg at 05/14/17 2135  ???  0.9% sodium chloride infusion, 100 mL/hr, IntraVENous, CONTINUOUS, Sundra Aland, MD, Last Rate: 100 mL/hr at 05/15/17 0239, 100 mL/hr at 05/15/17 0239  ???  sodium chloride (NS) flush 5-10 mL, 5-10 mL, IntraVENous, Q8H, Josie Saunders, Anuj, MD, 10 mL at 05/15/17 0614  ???  sodium chloride (NS) flush 5-10 mL, 5-10 mL, IntraVENous, PRN, Sundra Aland, MD  ???  acetaminophen (TYLENOL) tablet 650 mg, 650 mg, Oral, Q4H PRN, 650 mg at 05/14/17 1831 **OR** acetaminophen (TYLENOL) solution 650 mg, 650 mg, Oral, Q4H PRN **OR** acetaminophen (TYLENOL) suppository 650 mg, 650 mg, Rectal, Q4H PRN, Sundra Aland, MD  ???  naloxone (NARCAN) injection 0.1 mg, 0.1 mg, IntraVENous, PRN, Sundra Aland, MD  ???  dextrose (D50W) injection syrg 5-25 g, 10-50 mL, IntraVENous, PRN, Josie Saunders, Anuj, MD  ???  glucagon (GLUCAGEN) injection 1 mg, 1 mg, IntraMUSCular, PRN, Sundra Aland, MD  ???  insulin glargine (LANTUS) injection 1-100 Units, 1-100 Units,  SubCUTAneous, QHS, Khanna, Anuj, MD, 22 Units at 05/14/17 2149  ???  insulin lispro (HUMALOG) injection 1-100 Units, 1-100 Units, SubCUTAneous, AC&HS, Sundra Aland, MD, 2 Units at 05/14/17 2150  ???  insulin lispro (HUMALOG) injection  1-100 Units, 1-100 Units, SubCUTAneous, PRN, Sundra Aland, MD  ???  vancomycin (VANCOCIN) 1,250 mg in 0.9% sodium chloride 250 mL IVPB, 1,250 mg, IntraVENous, Q12H, Callens, Verlan Friends, PA-C, Last Rate: 166.7 mL/hr at 05/14/17 2320, 1,250 mg at 05/14/17 2320  ???  Vanco trough due, , Other, ONCE, Callens, Kate E, PA-C  ???  *Vancomycin per Pharmacy Dosing, 1 Each, Other, Rx Dosing/Monitoring, Callens, Verlan Friends, PA-C  ???  acyclovir (ZOVIRAX) capsule 400 mg, 400 mg, Oral, BID, Sundra Aland, MD, 400 mg at 05/14/17 2136  ???  senna-docusate (PERICOLACE) 8.6-50 mg per tablet 1 Tab, 1 Tab, Oral, QID PRN, Knox Saliva, MD, 1 Tab at 05/14/17 1123  ???  posaconazole (NOXAFIL) DR tablet 300 mg (Patient Supplied), 300 mg, Oral, DAILY WITH BREAKFAST, Knox Saliva, MD, 300 mg at 05/14/17 2134  ???  magic mouthwash (FIRST-MOUTHWASH BLM) oral suspension 5 mL, 5 mL, Oral, QID PRN, Morris, Derrek Gu, MD    [name], MD   [facility]  Office [phone]

## 2017-05-15 NOTE — Progress Notes (Signed)
Discharge Plan:   Home    Discharge Date:     05/15/2017     [facility] given : Yes    Home Health Needed:     No    DME needed and ordered for Discharge:    No    TCC Referral:     Yes, pt instructed to call and make appt., if appt not scheduled prior to dc.    Transportation: Family or friend.

## 2017-05-15 NOTE — Other (Signed)
Pt requesting Miralax po no meds ordered .Dr Lynnette Caffey made aware.Orders received Milarax po 17 g See MAR  .Will continue to monitor pt.

## 2017-05-15 NOTE — Other (Signed)
Pt received 2" U"Platelets for,Plt 37.V/S were stable before,during,after blood  Administration.Will continue to monitor pt.

## 2017-05-15 NOTE — Progress Notes (Signed)
Vanc trough and dose this am was refused- being discharged pending RN.  No further level ordered by me- I'm following tomorrow if she's still here.

## 2017-05-15 NOTE — Progress Notes (Signed)
Patient admitted on 05/13/2017 from Home with   Chief Complaint   Patient presents with   ??? Fever   ??? Cough        The patient has been admitted to the hospital 1 times in the past 12 months.    Previous 4 Admission Dates Admission and Discharge Diagnosis Interventions Barriers Disposition                                   Tentative dc plan:    Home, no dc anticipated at this time.  Referral put in for TCC as pt doesn't have a PCP.    Anticipated Discharge Date:   Today    PCP: Other, Phys, MD     Specialists:     [facility]    Face sheet information, address, contact info and insurance verified Yes    Pharmacy:     [facility]  Any issues getting medications-No    Home Environment:    Lives at [address]  [place] [address]    [email or web address] @.     Prior to admission open services:     No    Extended Emergency Contact Information  Primary Emergency ContactLorelle Formosa  Mobile Phone: [phone]  Relation: Brother      Transportation:     Family or friend will transport home    Case Management Assessment    ABUSE/NEGLECT SCREENING   Physical Abuse/Neglect: Denies   Sexual Abuse: Denies   Sexual Abuse: Denies   Other Abuse/Issues: Denies          PRIMARY DECISION MAKER                                   CARE MANAGEMENT INTERVENTIONS       PCP Verified by CM: No           Mode of Transport at Discharge: Self       Transition of Care Consult (CM Consult): Discharge Planning           MyChart Signup: No   Discharge Durable Medical Equipment: No   Physical Therapy Consult: No   Occupational Therapy Consult: No   Speech Therapy Consult: No       Reason for Referral: DCP Rounds   History Provided By: Patient   Patient Orientation: Alert and Oriented   Cognition: Alert   Support System Response: Concerned           Prior Functional Level: Independent in ADLs/IADLs   Current Functional Level: Independent in ADLs/IADLs   Primary Language: English   Can patient return to prior living arrangement: Yes    Ability to make needs known:: Good   Family able to assist with home care needs:: Yes               Types of Needs Identified: Disease Management Education, Treatment Education   Anticipated Discharge Needs: TCC   Confirm Follow Up Transport: Self       Plan discussed with Pt/Family/Caregiver: Yes(Pt)   Freedom of Choice Offered: Yes      DISCHARGE LOCATION   Discharge Placement: Home

## 2017-05-15 NOTE — Discharge Summary (Signed)
Discharge Summary         Patient ID:  [name] Wu, 57 y.o., female  DOB: [birth year]    PCP:  Lacey Jensen, MD    Admit Date: 05/13/2017  6:57 AM  Discharge Date:  05/15/2017  2:00 PM  Length of stay: 2 day(s)  Discharge Disposition: Home or Self Care     Chief Complaint   Patient presents with   ??? Fever   ??? Cough       Discharge Diagnosis:   1. Neutropenic fever/severe sepsis (POA) - CULTURES NEGATIVE  2. Spontaneous bleeding due to thrombocytopenia  3. Severe pancytopenia  1. Severe thrombocytopenia - 1K on admit  2. Severe leukopenia - 0.1 on admit  3. Severe anemia - Hb 5.6 on admit  4. UTI Ruled out  5. Hematuria, likely spontaneous due to platelet  6. ALL, on chemotherapy  7. DM 2  8. HTN    CONCERNS WHICH NEED CLOSE FOLLOW-UP:    Follow-up Information     Follow up With Specialties Details Why Contact Info    [name], MD Internal Medicine, Hematology, Oncology Call office will call , for follow-up appointment withy our Oncology  [address]  [address]  [facility]  [place] [address]  [phone]      Transitional Care Clinic  Go on 05/22/2017 Follow up appointment January 10 at 8:45AM.  Bring ID, insurance card and all medications [place].  [facility])  [place]  [phone]          HPI on Admission (per admitting physician):  Patient is a very pleasant 57 year old African American female, she has a history of known acute lymphoblastic leukemia. She presents to hospital with chief complaint of blood in urine. She was admitted to hospital roughly 2 weeks ago for evaluation of shortness of breath. She was discharged without any clear findings. She is currently receiving chemotherapy and oncology care in [place] at [facility] [name]. She last received her chemotherapy dose on 12/25. She will return to [place] on 12/26 and has since felt unwell, weakness, anorexia, poor by  mouth intake. She has noticed some blood in her sputum after cough, did notice some darkness to her stools. Yesterday she had onset of bright red blood in the urine which alarmed her so she presented to the emergency room. In the emergency room CBC showed severe pancytopenia with a platelet count of 1000. She was also noted to spike a fever in the emergency room. Patient without diarrhea, no burning on urination, no fever or chills at home. No productive cough. No sick contacts. Her next chemotherapy is scheduled for either January 10 or January 18 and back in [facility] Course:    Patient admitted to hospital with fever and blood in urine. On admission to hospital she was febrile and markedly pancytopenic. On admission it was suspected that this is likely related to recent chemotherapy on 12/25, however she was admitted for 48 hours for evaluation. She was started on broad-spectrum antibiotics. By the time of discharge culture remained negative and no source of fever found. She was markedly pancytopenic, she was followed by in-house oncology. Patient transfused packed blood cells as well as multiple bags of platelets. These counts have improved. Patient had neutropenia, however with discussion with oncology she did not wish to take further GSF stimulating factors at this time. She prefers to follow up with [facility] [name] whom she  follows with in [place], and frequent travels back and forth. Attempt to discuss with her oncologist in [place] was made, however no return call received. Stable for discharge with outpatient follow-up.    Condition at discharge:  Afebrile  Ambulating  Eating, Drinking, Voiding  Stable    Operative Procedures:    None.    Consultants/Treatment Team:    Treatment Team: Consulting Provider: Other, Phys, MD; Consulting Provider: Sundra Aland, MD; Consulting Provider: Dorrene German, MD  Physical Exam on Discharge:  Visit Vitals   BP 136/82 (BP 1 Location: Right arm, BP Patient Position: Sitting)   Pulse (!) 106   Temp 98.8 ??F (37.1 ??C)   Resp 16   Ht 5' (1.524 m)   Wt 85.7 kg (188 lb 15 oz)   SpO2 100%   Breastfeeding? No   BMI 36.90 kg/m??     Gen - AAOx3, NAD  HEENT - NC/AT, PERRL, EOMI, mucous membranes moist  Neck - supple, no JVD  Cardiac - RRR, S1, S2, no murmurs  Chest/Lungs - clear to auscultation without wheezes or rhonchi  Abdomen - soft, nontender, nondistended, normal bowel sounds  Extremities - no C/C/E  Neuro - CN 2-12 intact. No motor or sensory deficit.  Skin - no rashes or lesions    Discharge Medication List as of 05/15/2017  1:29 PM      CONTINUE these medications which have NOT CHANGED    Details   famotidine (PEPCID) 20 mg tablet Take 20 mg by mouth two (2) times a day., Historical Med      prochlorperazine (COMPAZINE) 10 mg tablet Take 5 mg by mouth every six (6) hours as needed., Historical Med      polyethylene glycol (MIRALAX) 17 gram/dose powder Take 17 g by mouth daily., Historical Med      oxyCODONE IR (ROXICODONE) 5 mg immediate release tablet Take 2 Tabs by mouth every four (4) hours as needed. Max Daily Amount: 60 mg., Print, Disp-60 Tab, R-0      acyclovir (ZOVIRAX) 400 mg tablet Take 400 mg by mouth two (2) times a day., Historical Med      tbo-filgrastim (GRANIX) 480 mcg/0.8 mL syrg injection 480 mcg by SubCUTAneous route once., Historical Med      insulin lispro (HUMALOG JUNIOR KWIKPEN U-100) 100 unit/mL inph by SubCUTAneous route three (3) times daily., Historical Med      potassium chloride SR (K-TAB) 20 mEq tablet Take 20 mEq by mouth two (2) times a day., Historical Med      levoFLOXacin (LEVAQUIN) 500 mg tablet Take 500 mg by mouth daily., Historical Med      posaconazole (NOXAFIL) 100 mg DR tablet Take 300 mg by mouth daily (with breakfast)., Historical Med      insulin degludec (TRESIBA FLEXTOUCH U-200) 200 unit/mL (3 mL) inpn 20 Units by SubCUTAneous route daily., Historical Med       zolpidem (AMBIEN) 5 mg tablet Take 5 mg by mouth nightly., Historical Med         STOP taking these medications       dasatinib (SPRYCEL) 140 mg tab Comments:   Reason for Stopping:               Most Recent Labs:  No results found for this or any previous visit (from the past 24 hour(s)).      XR Results:  Results from [place] encounter on 05/13/17   XR CHEST PA LAT    Narrative PA and lateral  chest    Comparison: None    Indication: Fever    Findings: Chest reveals normal Cardiac silhouette. Lungs are clear of any  failure, acute infiltrates, or other abnormality. Bony structures are normal.  Costophrenic angles are clear.       Impression Impression: Negative chest.         CT Results:  Results from Hospital Encounter encounter on 04/25/17   CTA CHEST W OR W WO CONT    Narrative Indication: Dyspnea. Leukemia. Diabetes. Hypertension. Chemotherapy. Elevated  troponin and chest pressure.      Impression IMPRESSION: No pulmonary embolism. Mild cardiomegaly. Bibasilar discoid  atelectasis.    Comment: 3-D CTA of the chest was performed following administration of  intravenous contrast. Multiplanar PE protocol and MIPS projections were  obtained. The initial report was rendered by [facility] radiology services.    The heart is mildly enlarged. There is no pulmonary embolism or aortic  dissection. The aorta is of normal caliber.    No pleural or pericardial effusion.    There is dependent subsegmental atelectasis at both lung bases. No pulmonary  consolidation or mass.    No adenopathy.         MRI Results:  No results found for this or any previous visit.    Nuclear Medicine Results:  No results found for this or any previous visit.    [place] Results:  No results found for this or any previous visit.    IR Results:  No results found for this or any previous visit.    VAS/US Results:  Results from Hospital Encounter encounter on 04/25/17   DUPLEX LOWER EXT VENOUS BILAT     Narrative                                                              Study ID: [id number]                                                [facility]                                            [address]  [place]anne [place],                                         [place]                                  Lower Extremity Venous Report    Name: CARIN, SHIPP            Study Date: 04/25/2017 08:46 AM  MRN: [id number]                     Patient Location: CD06  DOB: 08-28-1960                  Age: 75 yrs  Gender: Female                   Account #: [id number]  Reason For Study: Elevated D-dimer  Ordering Physician: Meredeth Ide  Performed By: Wannetta Sender    Interpretation Summary  No evidence of acute superficial or deep vein thrombosis noted in either  lower extremity .    _____________________________________________________________________________  Verneda Skill  Complete bilateral venous duplex performed. ICD 10: R78.87.    HISTORY/SYMPTOMS  Cancer. Elevated d-dimer.    RIGHT LEG  The right common femoral, femoral, popliteal, posterior tibial and peroneal  veins were examined with duplex ultrasound. The deep veins were patent and  compressible with no evidence of intraluminal thrombus. Spontaneous, phasic  venous flow with normal augmentation was noted throughout the right leg.  RIGHT SAPHENOUS VEINS  Compression of the right proximal greater saphenous vein is complete.  Spectral Doppler exam demonstrates normal and spontaneous venous flow in the  right proximal greater saphenous vein.    LEFT LEG  The left common femoral, femoral, popliteal, posterior tibial and peroneal   veins were examined with duplex ultrasound. The deep veins were patent and  compressible with no evidence of intraluminal thrombus. Spontaneous, phasic  venous flow with normal augmentation was noted throughout the left leg.    LEFT LEG SAPHENOUS VEINS  Compression of the left proximal greater saphenous vein is complete. Spectral  Doppler exam demonstrates normal and spontaneous venous flow in the left  proximal greater saphenous vein.      Electronically signed byDr. Judithe Modest, M.D   04/25/2017 05:31 PM           Total discharge time 45 minutes.       Sundra Aland, MD  Hospitalist  [facility]  May 16, 2017  12:13 PM

## 2017-05-16 LAB — PLATELETS, ALLOCATE
Blood type code: 6200
Blood type code: 6200
Issue date/time: 201901022332
Issue date/time: 201901030341
Status info.: TRANSFUSED
Status info.: TRANSFUSED

## 2017-05-19 LAB — CULTURE, BLOOD
Blood Culture Result: NO GROWTH
Blood Culture Result: NO GROWTH

## 2017-05-20 ENCOUNTER — Encounter

## 2017-05-20 ENCOUNTER — Inpatient Hospital Stay: Admit: 2017-05-20 | Payer: BLUE CROSS/BLUE SHIELD | Attending: Nurse Practitioner | Primary: Internal Medicine

## 2017-05-20 DIAGNOSIS — R918 Other nonspecific abnormal finding of lung field: Secondary | ICD-10-CM

## 2017-05-26 IMAGING — CT CT RENAL STONE PROTOCOL
2 of 3 series · 17 of 46 positions shown, 19 images · non-contrast
Comparison: None.

CLINICAL DATA: Initial evaluation for acute right lower abdominal
pain and right flank pain since yesterday.

EXAM:
CT ABDOMEN AND PELVIS WITHOUT CONTRAST
TECHNIQUE: Multidetector CT imaging of the abdomen and pelvis was performed
following the standard protocol without IV contrast.

[Series 4: lung · axial · 0.83mm/px · z∈[-454,-350]mm · 14 of 60 slices shown, 16 images]
[im 4/60  soft-tissue]
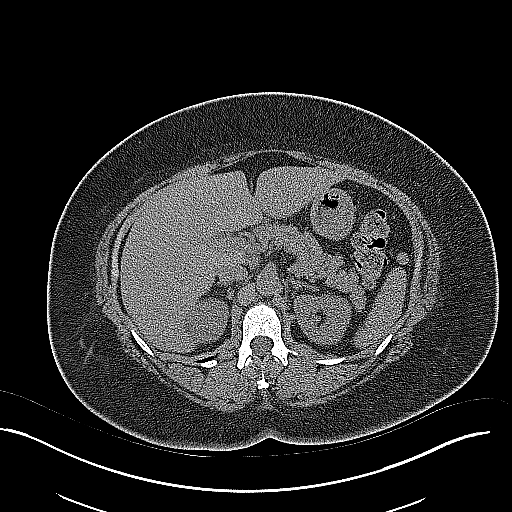
[im 4/60  bone]
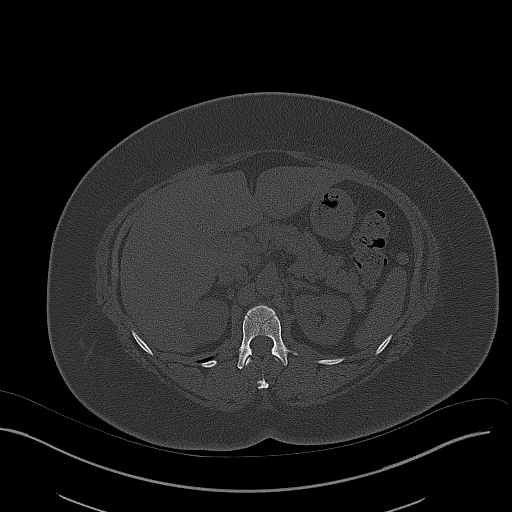
[im 8/60  soft-tissue]
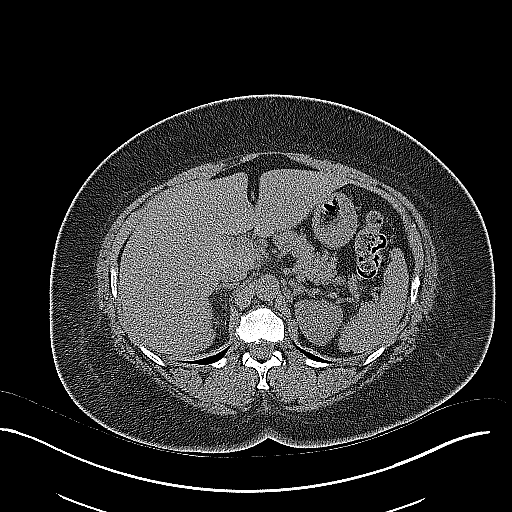
[im 12/60  soft-tissue]
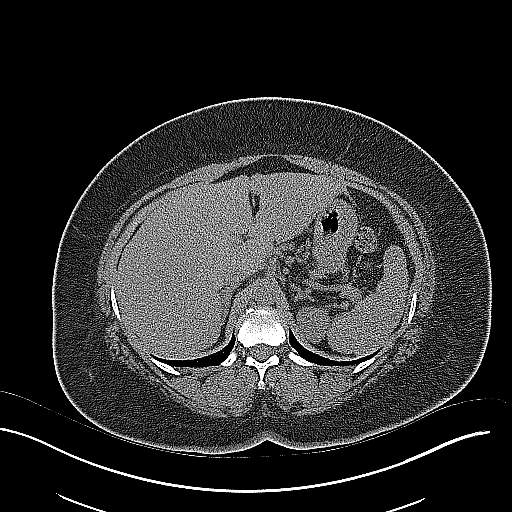
[im 16/60  soft-tissue]
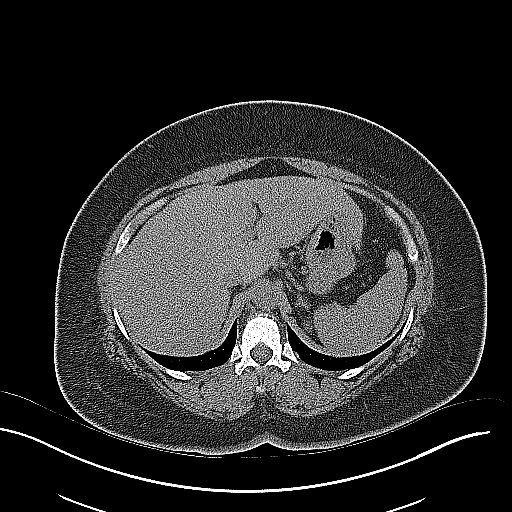
[im 20/60  soft-tissue]
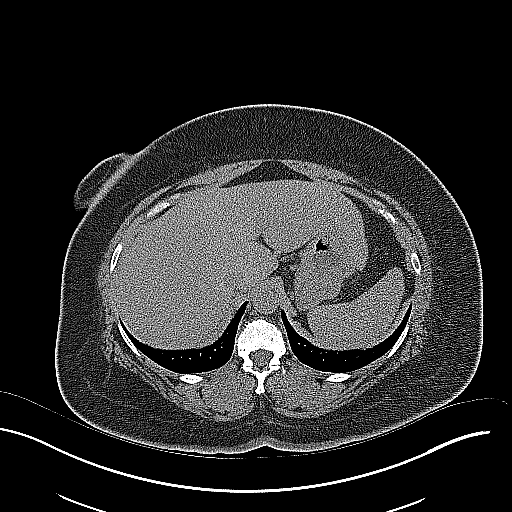
[im 23/60  soft-tissue]
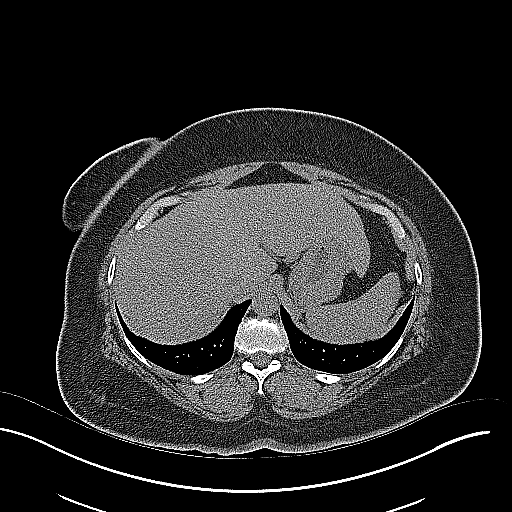
[im 27/60  soft-tissue]
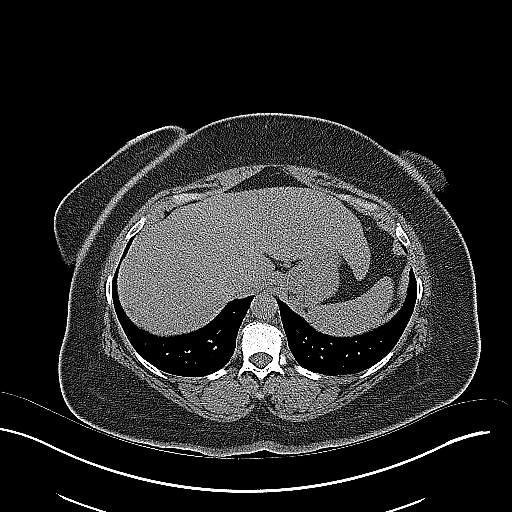
[im 33/60  soft-tissue]
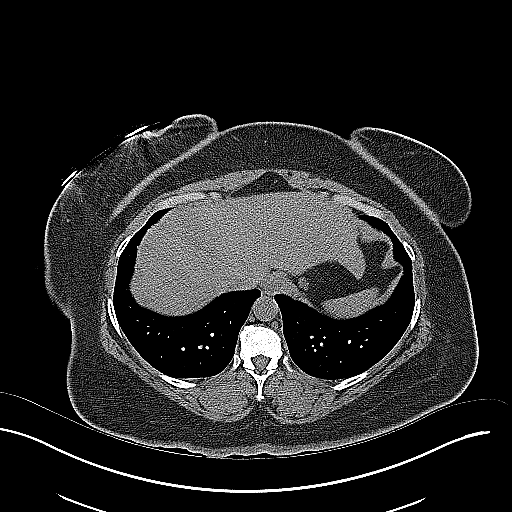
[im 37/60  soft-tissue]
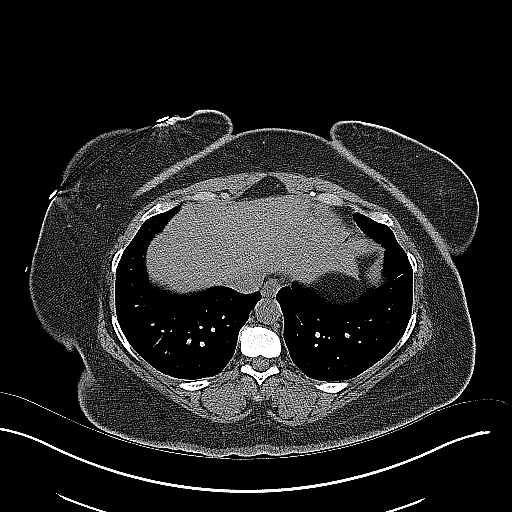
[im 37/60  bone]
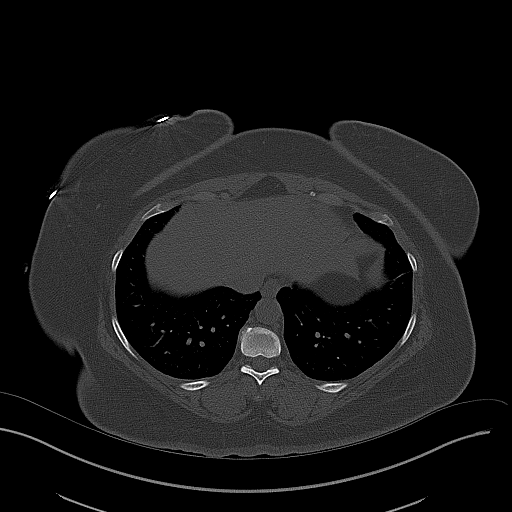
[im 40/60  soft-tissue]
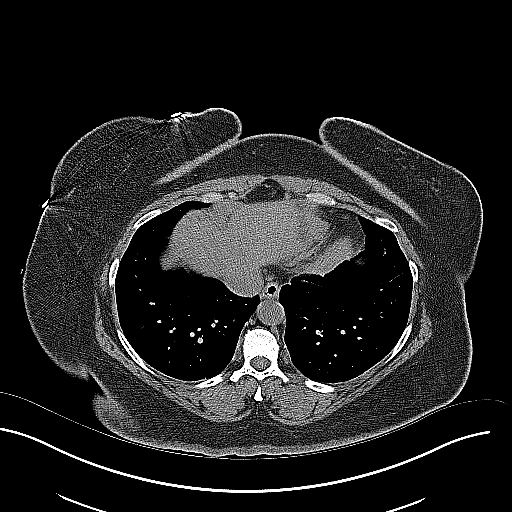
[im 44/60  soft-tissue]
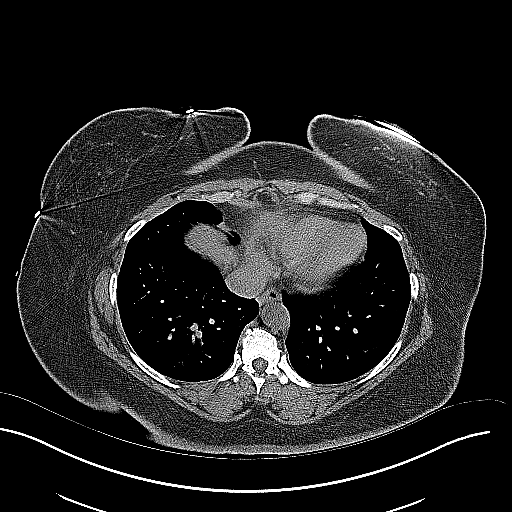
[im 48/60  soft-tissue]
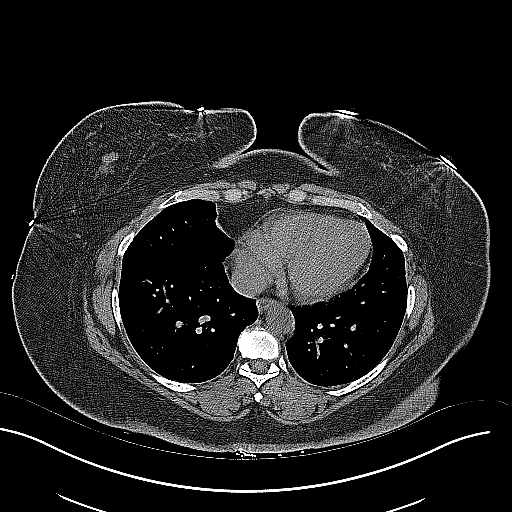
[im 52/60  soft-tissue]
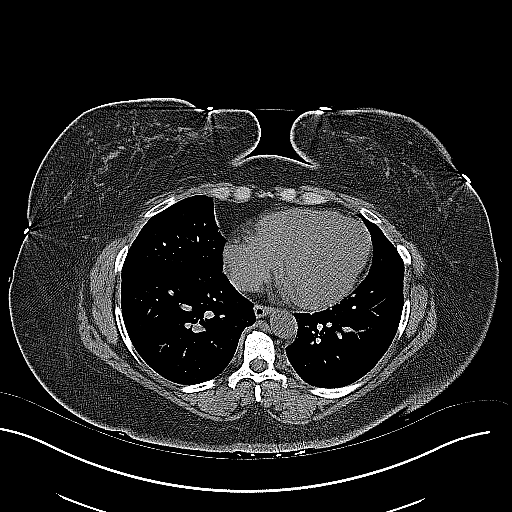
[im 56/60  soft-tissue]
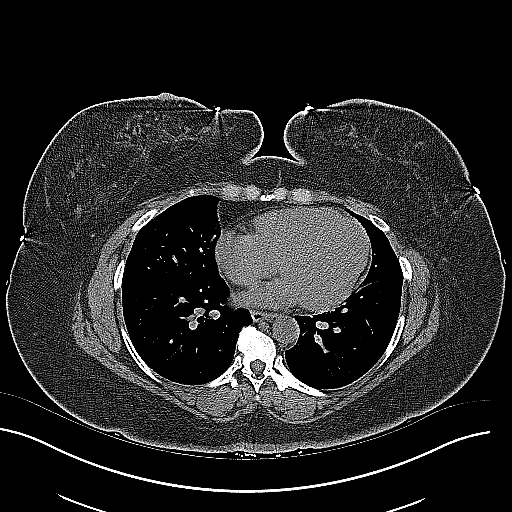

[Series 5: coronal · coronal · 0.82mm/px · 3 of 150 slices shown]
[im 50/150  soft-tissue]
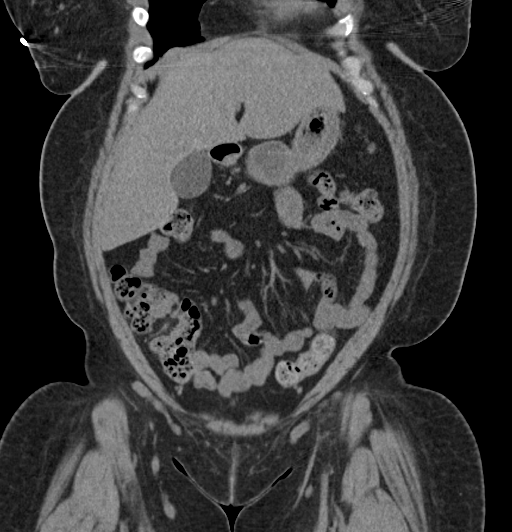
[im 67/150  soft-tissue]
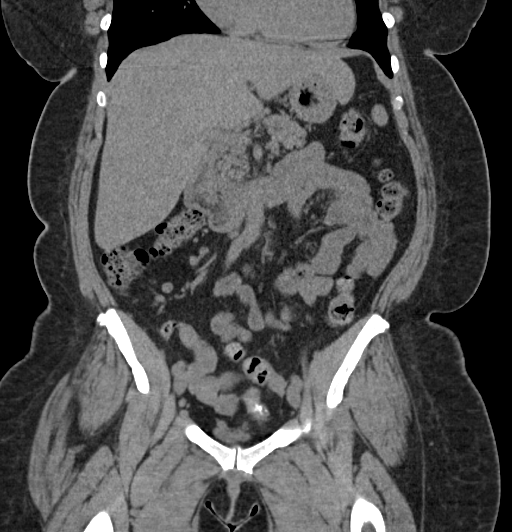
[im 83/150  soft-tissue]
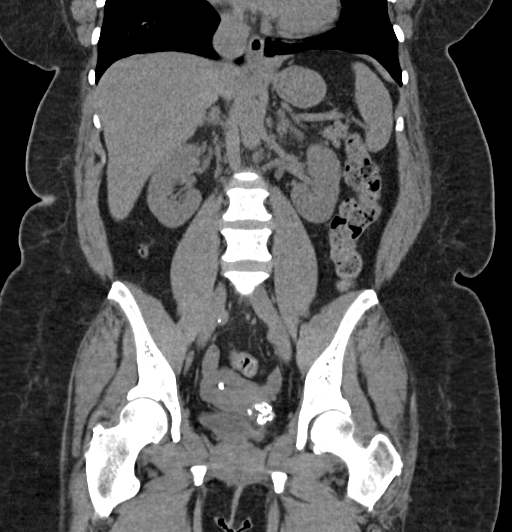

[17 of 46 positions shown; findings below may reference images not displayed]

FINDINGS: Lower chest: Minimal atelectatic changes present within the left
lung base. Visualized lungs are otherwise clear.

Hepatobiliary: Limited noncontrast evaluation of the liver is
unremarkable. Gallbladder within normal limits. No biliary
dilatation.

Pancreas: Pancreas within normal limits.

Spleen: Spleen within normal limits.

Adrenals/Urinary Tract: Adrenal glands within normal limits. Kidneys
equal in size. No nephrolithiasis or hydronephrosis. No radiopaque
calculi seen along the course of either ureter. There is no
hydroureter. Bladder largely decompressed without acute abnormality.
No definite layering stones within the bladder lumen.

Stomach/Bowel: Stomach within normal limits. Small bowel of normal
caliber without evidence for obstruction or acute inflammation.
Appendix visualized within the right lower quadrant and is of normal
caliber and appearance without associated inflammatory changes to
suggest acute appendicitis. No acute inflammatory changes seen
elsewhere about the bowels.

Vascular/Lymphatic: Minimal atheromatous plaque within the
intra-abdominal aorta. No aneurysm. No adenopathy.

Reproductive: Multiple calcified fibroids noted within the uterus.
Ovaries within normal limits.

Other: No free air or fluid. Small fat containing paraumbilical
hernia noted.

Musculoskeletal: No acute osseous abnormality. No worrisome lytic or
blastic osseous lesions. Bilateral facet arthropathy noted within
the lower lumbar spine.
IMPRESSION: 1. No CT evidence for acute intra-abdominal or pelvic process.
2. No nephrolithiasis or evidence for obstructive uropathy.
3. Normal appendix.
4. Fibroid uterus.

## 2017-12-04 ENCOUNTER — Inpatient Hospital Stay: Admit: 2017-12-04 | Payer: BLUE CROSS/BLUE SHIELD | Primary: Internal Medicine

## 2017-12-04 DIAGNOSIS — C9100 Acute lymphoblastic leukemia not having achieved remission: Secondary | ICD-10-CM

## 2017-12-04 LAB — METABOLIC PANEL, COMPREHENSIVE
ALT (SGPT): 38 U/L (ref 12–78)
AST (SGOT): 21 U/L (ref 15–37)
Albumin: 3 gm/dl — ABNORMAL LOW (ref 3.4–5.0)
Alk. phosphatase: 87 U/L (ref 45–117)
Anion gap: 8 mmol/L (ref 5–15)
BUN: 7 mg/dl (ref 7–25)
Bilirubin, total: 0.8 mg/dl (ref 0.2–1.0)
CO2: 24 mEq/L (ref 21–32)
Calcium: 8.6 mg/dl (ref 8.5–10.1)
Chloride: 110 mEq/L — ABNORMAL HIGH (ref 98–107)
Creatinine: 0.5 mg/dl — ABNORMAL LOW (ref 0.6–1.3)
GFR est AA: >60
GFR est non-AA: >60
Glucose: 156 mg/dl — ABNORMAL HIGH (ref 74–106)
Potassium: 3.4 mEq/L — ABNORMAL LOW (ref 3.5–5.1)
Protein, total: 5.8 gm/dl — ABNORMAL LOW (ref 6.4–8.2)
Sodium: 142 mEq/L (ref 136–145)

## 2017-12-04 LAB — CBC WITH AUTOMATED DIFF
HCT: 31.7 % — ABNORMAL LOW (ref 37.0–50.0)
HGB: 10.5 gm/dl — ABNORMAL LOW (ref 13.0–17.2)
IMMATURE GRANULOCYTES: 5.8 % — CR (ref 0.0–3.0)
LYMPHOCYTES: 23.8 % — ABNORMAL LOW (ref 28–48)
MCH: 33.7 pg (ref 25.4–34.6)
MCHC: 33.1 gm/dl (ref 30.0–36.0)
MCV: 101.6 fL — ABNORMAL HIGH (ref 80.0–98.0)
MONOCYTES: 3.8 % (ref 1–13)
MPV: 10.5 fL — ABNORMAL HIGH (ref 6.0–10.0)
NEUTROPHILS: 72.4 % — ABNORMAL HIGH (ref 34–64)
NRBC: 0 (ref 0–0)
PLATELET COMMENTS: DECREASED
PLATELET: 74 10*3/uL — ABNORMAL LOW (ref 140–450)
RBC: 3.12 M/uL — ABNORMAL LOW (ref 3.60–5.20)
RDW-SD: 69.7 — ABNORMAL HIGH (ref 36.4–46.3)
WBC: 3.6 10*3/uL — ABNORMAL LOW (ref 4.0–11.0)

## 2017-12-04 LAB — MAGNESIUM
Magnesium: 1.8 mg/dl (ref 1.6–2.6)
Magnesium: 1.8 mg/dl (ref 1.6–2.6)

## 2017-12-04 LAB — CBC WITH AUTO DIFFERENTIAL
Hematocrit: 31.7 % — ABNORMAL LOW (ref 37.0–50.0)
Hemoglobin: 10.5 gm/dl — ABNORMAL LOW (ref 13.0–17.2)
Immature Granulocytes %: 5.8 % (ref 0.0–3.0)
Lymphocytes %: 23.8 % — ABNORMAL LOW (ref 28–48)
MCH: 33.7 pg (ref 25.4–34.6)
MCHC: 33.1 gm/dl (ref 30.0–36.0)
MCV: 101.6 fL — ABNORMAL HIGH (ref 80.0–98.0)
MPV: 10.5 fL — ABNORMAL HIGH (ref 6.0–10.0)
Monocytes %: 3.8 % (ref 1–13)
Neutrophils %: 72.4 % — ABNORMAL HIGH (ref 34–64)
Nucleated RBCs: 0 (ref 0–0)
Platelet Comment: DECREASED
Platelets: 74 10*3/uL — ABNORMAL LOW (ref 140–450)
RBC: 3.12 M/uL — ABNORMAL LOW (ref 3.60–5.20)
RDW-SD: 69.7 — ABNORMAL HIGH (ref 36.4–46.3)
WBC: 3.6 10*3/uL — ABNORMAL LOW (ref 4.0–11.0)

## 2017-12-04 LAB — COMPREHENSIVE METABOLIC PANEL
ALT: 38 U/L (ref 12–78)
AST: 21 U/L (ref 15–37)
Albumin: 3 gm/dl — ABNORMAL LOW (ref 3.4–5.0)
Alkaline Phosphatase: 87 U/L (ref 45–117)
Anion Gap: 8 mmol/L (ref 5–15)
BUN: 7 mg/dl (ref 7–25)
CO2: 24 mEq/L (ref 21–32)
Calcium: 8.6 mg/dl (ref 8.5–10.1)
Chloride: 110 mEq/L — ABNORMAL HIGH (ref 98–107)
Creatinine: 0.5 mg/dl — ABNORMAL LOW (ref 0.6–1.3)
EGFR IF NonAfrican American: >60
GFR African American: >60
Glucose: 156 mg/dl — ABNORMAL HIGH (ref 74–106)
Potassium: 3.4 mEq/L — ABNORMAL LOW (ref 3.5–5.1)
Sodium: 142 mEq/L (ref 136–145)
Total Bilirubin: 0.8 mg/dl (ref 0.2–1.0)
Total Protein: 5.8 gm/dl — ABNORMAL LOW (ref 6.4–8.2)

## 2017-12-08 ENCOUNTER — Inpatient Hospital Stay: Admit: 2017-12-08 | Payer: BLUE CROSS/BLUE SHIELD | Primary: Internal Medicine

## 2017-12-08 DIAGNOSIS — C9100 Acute lymphoblastic leukemia not having achieved remission: Secondary | ICD-10-CM

## 2017-12-08 LAB — METABOLIC PANEL, COMPREHENSIVE
ALT (SGPT): 28 U/L (ref 12–78)
AST (SGOT): 18 U/L (ref 15–37)
Albumin: 3.2 gm/dl — ABNORMAL LOW (ref 3.4–5.0)
Alk. phosphatase: 96 U/L (ref 45–117)
Anion gap: 8 mmol/L (ref 5–15)
BUN: 7 mg/dL (ref 7–25)
Bilirubin, total: 0.7 mg/dL (ref 0.2–1.0)
CO2: 27 meq/L (ref 21–32)
Calcium: 8.7 mg/dL (ref 8.5–10.1)
Chloride: 109 mEq/L — ABNORMAL HIGH (ref 98–107)
Creatinine: 0.6 mg/dL (ref 0.6–1.3)
GFR est AA: >60
GFR est non-AA: >60
Glucose: 110 mg/dl — ABNORMAL HIGH (ref 74–106)
Potassium: 3.4 mEq/L — ABNORMAL LOW (ref 3.5–5.1)
Protein, total: 5.7 gm/dl — ABNORMAL LOW (ref 6.4–8.2)
Sodium: 144 meq/L (ref 136–145)

## 2017-12-08 LAB — CBC WITH AUTOMATED DIFF
ATYPICAL LYMPHS: 0.9 % — ABNORMAL HIGH (ref 0–0)
EOSINOPHILS: 0.9 % (ref 0–5)
HCT: 32.5 % — ABNORMAL LOW (ref 37.0–50.0)
HGB: 10.8 gm/dl — ABNORMAL LOW (ref 13.0–17.2)
LYMPHOCYTES: 13.3 % — ABNORMAL LOW (ref 28–48)
MCH: 33.8 pg (ref 25.4–34.6)
MCHC: 33.2 g/dL (ref 30.0–36.0)
MCV: 101.6 fL — ABNORMAL HIGH (ref 80.0–98.0)
METAMYELOCYTES: 1 % — ABNORMAL HIGH (ref 0–0)
MONOCYTES: 6.7 % (ref 1–13)
MPV: 10.6 fL — ABNORMAL HIGH (ref 6.0–10.0)
NEUTROPHILS: 78.1 % — ABNORMAL HIGH (ref 34–64)
NRBC: 0 (ref 0–0)
Ovalocytes-DIF: 1
PLATELET COMMENTS: DECREASED
PLATELET: 92 10*3/uL — ABNORMAL LOW (ref 140–450)
RBC: 3.2 M/uL — ABNORMAL LOW (ref 3.60–5.20)
RDW-SD: 67.1 — ABNORMAL HIGH (ref 36.4–46.3)
WBC: 3.3 10*3/uL — ABNORMAL LOW (ref 4.0–11.0)

## 2017-12-08 LAB — MAGNESIUM
Magnesium: 1.4 mg/dl — ABNORMAL LOW (ref 1.6–2.6)
Magnesium: 1.4 mg/dl — ABNORMAL LOW (ref 1.6–2.6)

## 2017-12-08 LAB — COMPREHENSIVE METABOLIC PANEL
ALT: 28 U/L (ref 12–78)
AST: 18 U/L (ref 15–37)
Albumin: 3.2 gm/dl — ABNORMAL LOW (ref 3.4–5.0)
Alkaline Phosphatase: 96 U/L (ref 45–117)
Anion Gap: 8 mmol/L (ref 5–15)
BUN: 7 mg/dl (ref 7–25)
CO2: 27 mEq/L (ref 21–32)
Calcium: 8.7 mg/dl (ref 8.5–10.1)
Chloride: 109 mEq/L — ABNORMAL HIGH (ref 98–107)
Creatinine: 0.6 mg/dl (ref 0.6–1.3)
GFR African American: >60
Glucose: 110 mg/dl — ABNORMAL HIGH (ref 74–106)
Potassium: 3.4 mEq/L — ABNORMAL LOW (ref 3.5–5.1)
Sodium: 144 mEq/L (ref 136–145)
Total Bilirubin: 0.7 mg/dl (ref 0.2–1.0)
Total Protein: 5.7 gm/dl — ABNORMAL LOW (ref 6.4–8.2)
eGFR NON-AA: >60

## 2017-12-08 LAB — CBC WITH AUTO DIFFERENTIAL
Atypical Lymphocytes: 0.9 % — ABNORMAL HIGH (ref 0–0)
Eosinophils %: 0.9 % (ref 0–5)
Hematocrit: 32.5 % — ABNORMAL LOW (ref 37.0–50.0)
Hemoglobin: 10.8 gm/dl — ABNORMAL LOW (ref 13.0–17.2)
Lymphocytes %: 13.3 % — ABNORMAL LOW (ref 28–48)
MCH: 33.8 pg (ref 25.4–34.6)
MCHC: 33.2 gm/dl (ref 30.0–36.0)
MCV: 101.6 fL — ABNORMAL HIGH (ref 80.0–98.0)
MPV: 10.6 fL — ABNORMAL HIGH (ref 6.0–10.0)
Metamyelocytes Relative: 1 % — ABNORMAL HIGH (ref 0–0)
Monocytes %: 6.7 % (ref 1–13)
Neutrophils %: 78.1 % — ABNORMAL HIGH (ref 34–64)
Nucleated RBCs: 0 (ref 0–0)
OVALOCYTES-DIF: 1
Platelet Comment: DECREASED
Platelets: 92 10*3/uL — ABNORMAL LOW (ref 140–450)
RBC: 3.2 M/uL — ABNORMAL LOW (ref 3.60–5.20)
RDW-SD: 67.1 — ABNORMAL HIGH (ref 36.4–46.3)
WBC: 3.3 10*3/uL — ABNORMAL LOW (ref 4.0–11.0)

## 2017-12-10 LAB — CMV BY PCR, QT
CMV IU/mL: <200 [IU]/mL (ref <200)
CMV log IU/mL: <2.3 {Log} (ref <2.30)

## 2017-12-10 LAB — CMV BY PCR QUANTITATIVE
CMV IU/ML: <200 IU/mL (ref <200)
CMV LOG IU/ML: <2.3 log IU/mL (ref <2.30)

## 2017-12-11 ENCOUNTER — Inpatient Hospital Stay: Admit: 2017-12-11 | Payer: BLUE CROSS/BLUE SHIELD | Primary: Internal Medicine

## 2017-12-11 DIAGNOSIS — C9100 Acute lymphoblastic leukemia not having achieved remission: Secondary | ICD-10-CM

## 2017-12-11 LAB — CBC WITH AUTOMATED DIFF
BAND NEUTROPHILS: 9.6 % (ref 0–11)
BASOPHILS: 1 % (ref 0–3)
EOSINOPHILS: 0.9 % (ref 0–5)
Elliptocytes: 1
HCT: 33.4 % — ABNORMAL LOW (ref 37.0–50.0)
HGB: 11.2 gm/dl — ABNORMAL LOW (ref 13.0–17.2)
IMMATURE GRANULOCYTES: 8 % — CR (ref 0.0–3.0)
LYMPHOCYTES: 18.3 % — ABNORMAL LOW (ref 28–48)
MCH: 33.8 pg (ref 25.4–34.6)
MCHC: 33.5 gm/dl (ref 30.0–36.0)
MCV: 100.9 fL — ABNORMAL HIGH (ref 80.0–98.0)
METAMYELOCYTES: 2.9 % — ABNORMAL HIGH (ref 0–0)
MONOCYTES: 9.6 % (ref 1–13)
MPV: 10.5 fL — ABNORMAL HIGH (ref 6.0–10.0)
MYELOCYTES: 1 % — ABNORMAL HIGH (ref 0–0)
NEUTROPHILS: 56.7 % (ref 34–64)
NRBC: 0 (ref 0–0)
PLATELET COMMENTS: DECREASED
PLATELET: 106 10*3/uL — ABNORMAL LOW (ref 140–450)
Poikilocytosis: 1
RBC: 3.31 M/uL — ABNORMAL LOW (ref 3.60–5.20)
RDW-SD: 64 — ABNORMAL HIGH (ref 36.4–46.3)
WBC: 3.9 10*3/uL — ABNORMAL LOW (ref 4.0–11.0)

## 2017-12-11 LAB — METABOLIC PANEL, COMPREHENSIVE
ALT (SGPT): 30 U/L (ref 12–78)
AST (SGOT): 17 U/L (ref 15–37)
Albumin: 3.1 gm/dl — ABNORMAL LOW (ref 3.4–5.0)
Alk. phosphatase: 104 U/L (ref 45–117)
Anion gap: 7 mmol/L (ref 5–15)
BUN: 8 mg/dl (ref 7–25)
Bilirubin, total: 0.7 mg/dl (ref 0.2–1.0)
CO2: 26 mEq/L (ref 21–32)
Calcium: 8.9 mg/dl (ref 8.5–10.1)
Chloride: 107 mEq/L (ref 98–107)
Creatinine: 0.6 mg/dl (ref 0.6–1.3)
GFR est AA: >60
GFR est non-AA: >60
Glucose: 251 mg/dl — ABNORMAL HIGH (ref 74–106)
Potassium: 3.7 mEq/L (ref 3.5–5.1)
Protein, total: 5.8 gm/dl — ABNORMAL LOW (ref 6.4–8.2)
Sodium: 140 mEq/L (ref 136–145)

## 2017-12-11 LAB — MAGNESIUM
Magnesium: 1.6 mg/dl (ref 1.6–2.6)
Magnesium: 1.6 mg/dl (ref 1.6–2.6)

## 2017-12-11 LAB — CBC WITH AUTO DIFFERENTIAL
Band Neutrophils: 9.6 % (ref 0–11)
Basophils %: 1 % (ref 0–3)
ELLIPTOCYTES: 1
Eosinophils %: 0.9 % (ref 0–5)
Hematocrit: 33.4 % — ABNORMAL LOW (ref 37.0–50.0)
Hemoglobin: 11.2 gm/dl — ABNORMAL LOW (ref 13.0–17.2)
Immature Granulocytes %: 8 % (ref 0.0–3.0)
Lymphocytes %: 18.3 % — ABNORMAL LOW (ref 28–48)
MCH: 33.8 pg (ref 25.4–34.6)
MCHC: 33.5 gm/dl (ref 30.0–36.0)
MCV: 100.9 fL — ABNORMAL HIGH (ref 80.0–98.0)
MPV: 10.5 fL — ABNORMAL HIGH (ref 6.0–10.0)
Metamyelocytes Relative: 2.9 % — ABNORMAL HIGH (ref 0–0)
Monocytes %: 9.6 % (ref 1–13)
Myelocyte Percent: 1 % — ABNORMAL HIGH (ref 0–0)
Neutrophils %: 56.7 % (ref 34–64)
Nucleated RBCs: 0 (ref 0–0)
Platelet Comment: DECREASED
Platelets: 106 10*3/uL — ABNORMAL LOW (ref 140–450)
Poikilocytosis: 1
RBC: 3.31 M/uL — ABNORMAL LOW (ref 3.60–5.20)
RDW-SD: 64 — ABNORMAL HIGH (ref 36.4–46.3)
WBC: 3.9 10*3/uL — ABNORMAL LOW (ref 4.0–11.0)

## 2017-12-11 LAB — COMPREHENSIVE METABOLIC PANEL
ALT: 30 U/L (ref 12–78)
AST: 17 U/L (ref 15–37)
Albumin: 3.1 gm/dl — ABNORMAL LOW (ref 3.4–5.0)
Alkaline Phosphatase: 104 U/L (ref 45–117)
Anion Gap: 7 mmol/L (ref 5–15)
BUN: 8 mg/dl (ref 7–25)
CO2: 26 mEq/L (ref 21–32)
Calcium: 8.9 mg/dl (ref 8.5–10.1)
Chloride: 107 mEq/L (ref 98–107)
Creatinine: 0.6 mg/dl (ref 0.6–1.3)
GFR African American: >60
Glucose: 251 mg/dl — ABNORMAL HIGH (ref 74–106)
Potassium: 3.7 mEq/L (ref 3.5–5.1)
Sodium: 140 mEq/L (ref 136–145)
Total Bilirubin: 0.7 mg/dl (ref 0.2–1.0)
Total Protein: 5.8 gm/dl — ABNORMAL LOW (ref 6.4–8.2)
eGFR NON-AA: >60

## 2017-12-15 ENCOUNTER — Inpatient Hospital Stay: Admit: 2017-12-15 | Payer: BLUE CROSS/BLUE SHIELD | Primary: Internal Medicine

## 2017-12-15 DIAGNOSIS — C9100 Acute lymphoblastic leukemia not having achieved remission: Secondary | ICD-10-CM

## 2017-12-15 LAB — METABOLIC PANEL, COMPREHENSIVE
ALT (SGPT): 25 U/L (ref 12–78)
AST (SGOT): 16 U/L (ref 15–37)
Albumin: 3.2 gm/dl — ABNORMAL LOW (ref 3.4–5.0)
Alk. phosphatase: 101 U/L (ref 45–117)
Anion gap: 9 mmol/L (ref 5–15)
BUN: 11 mg/dl (ref 7–25)
Bilirubin, total: 0.9 mg/dl (ref 0.2–1.0)
CO2: 24 mEq/L (ref 21–32)
Calcium: 9.4 mg/dl (ref 8.5–10.1)
Chloride: 108 mEq/L — ABNORMAL HIGH (ref 98–107)
Creatinine: 0.6 mg/dl (ref 0.6–1.3)
GFR est AA: >60
GFR est non-AA: >60
Glucose: 163 mg/dl — ABNORMAL HIGH (ref 74–106)
Potassium: 3.9 mEq/L (ref 3.5–5.1)
Protein, total: 6 gm/dl — ABNORMAL LOW (ref 6.4–8.2)
Sodium: 142 mEq/L (ref 136–145)

## 2017-12-15 LAB — CBC WITH AUTOMATED DIFF
Anisocytosis: 1
HCT: 33.4 % — ABNORMAL LOW (ref 37.0–50.0)
HGB: 11.3 gm/dl — ABNORMAL LOW (ref 13.0–17.2)
LYMPHOCYTES: 15.2 % — ABNORMAL LOW (ref 28–48)
MCH: 34.1 pg (ref 25.4–34.6)
MCHC: 33.8 gm/dl (ref 30.0–36.0)
MCV: 100.9 fL — ABNORMAL HIGH (ref 80.0–98.0)
MONOCYTES: 7.6 % (ref 1–13)
MPV: 9.8 fL (ref 6.0–10.0)
NEUTROPHILS: 77.2 % — ABNORMAL HIGH (ref 34–64)
NRBC: 0 (ref 0–0)
PLATELET COMMENTS: DECREASED
PLATELET: 108 10*3/uL — ABNORMAL LOW (ref 140–450)
RBC: 3.31 M/uL — ABNORMAL LOW (ref 3.60–5.20)
RDW-SD: 63.5 — ABNORMAL HIGH (ref 36.4–46.3)
WBC: 5.1 10*3/uL (ref 4.0–11.0)

## 2017-12-15 LAB — MAGNESIUM
Magnesium: 1.5 mg/dl — ABNORMAL LOW (ref 1.6–2.6)
Magnesium: 1.5 mg/dl — ABNORMAL LOW (ref 1.6–2.6)

## 2017-12-15 LAB — CBC WITH AUTO DIFFERENTIAL
Anisocytosis: 1
Hematocrit: 33.4 % — ABNORMAL LOW (ref 37.0–50.0)
Hemoglobin: 11.3 gm/dl — ABNORMAL LOW (ref 13.0–17.2)
Lymphocytes %: 15.2 % — ABNORMAL LOW (ref 28–48)
MCH: 34.1 pg (ref 25.4–34.6)
MCHC: 33.8 gm/dl (ref 30.0–36.0)
MCV: 100.9 fL — ABNORMAL HIGH (ref 80.0–98.0)
MPV: 9.8 fL (ref 6.0–10.0)
Monocytes %: 7.6 % (ref 1–13)
Neutrophils %: 77.2 % — ABNORMAL HIGH (ref 34–64)
Nucleated RBCs: 0 (ref 0–0)
Platelet Comment: DECREASED
Platelets: 108 10*3/uL — ABNORMAL LOW (ref 140–450)
RBC: 3.31 M/uL — ABNORMAL LOW (ref 3.60–5.20)
RDW-SD: 63.5 — ABNORMAL HIGH (ref 36.4–46.3)
WBC: 5.1 10*3/uL (ref 4.0–11.0)

## 2017-12-15 LAB — COMPREHENSIVE METABOLIC PANEL
ALT: 25 U/L (ref 12–78)
AST: 16 U/L (ref 15–37)
Albumin: 3.2 gm/dl — ABNORMAL LOW (ref 3.4–5.0)
Alkaline Phosphatase: 101 U/L (ref 45–117)
Anion Gap: 9 mmol/L (ref 5–15)
BUN: 11 mg/dl (ref 7–25)
CO2: 24 mEq/L (ref 21–32)
Calcium: 9.4 mg/dl (ref 8.5–10.1)
Chloride: 108 mEq/L — ABNORMAL HIGH (ref 98–107)
Creatinine: 0.6 mg/dl (ref 0.6–1.3)
GFR African American: >60
Glucose: 163 mg/dl — ABNORMAL HIGH (ref 74–106)
Potassium: 3.9 mEq/L (ref 3.5–5.1)
Sodium: 142 mEq/L (ref 136–145)
Total Bilirubin: 0.9 mg/dl (ref 0.2–1.0)
Total Protein: 6 gm/dl — ABNORMAL LOW (ref 6.4–8.2)
eGFR NON-AA: >60

## 2017-12-16 LAB — CMV BY PCR, QT
CMV IU/mL: <200 IU/mL (ref <200)
CMV log IU/mL: <2.3 log IU/mL (ref <2.30)

## 2017-12-16 LAB — CMV BY PCR QUANTITATIVE
CMV IU/ML: <200 IU/mL (ref <200)
CMV LOG IU/ML: <2.3 log IU/mL (ref <2.30)

## 2017-12-17 LAB — TACROLIMUS, WHOLE BLOOD
TACROLIMUS,XTACR1: 10.8 mcg/L
Tacrolimus level: 10.8 mcg/L

## 2017-12-22 ENCOUNTER — Inpatient Hospital Stay: Admit: 2017-12-22 | Payer: BLUE CROSS/BLUE SHIELD | Primary: Internal Medicine

## 2017-12-22 DIAGNOSIS — C9100 Acute lymphoblastic leukemia not having achieved remission: Secondary | ICD-10-CM

## 2017-12-22 LAB — CBC WITH AUTOMATED DIFF
BASOPHILS: 0.3 % (ref 0–3)
EOSINOPHILS: 0.6 % (ref 0–5)
HCT: 34.1 % — ABNORMAL LOW (ref 37.0–50.0)
HGB: 11.8 gm/dl — ABNORMAL LOW (ref 13.0–17.2)
IMMATURE GRANULOCYTES: 1.2 % (ref 0.0–3.0)
LYMPHOCYTES: 18.5 % — ABNORMAL LOW (ref 28–48)
MCH: 34.4 pg (ref 25.4–34.6)
MCHC: 34.6 gm/dl (ref 30.0–36.0)
MCV: 99.4 fL — ABNORMAL HIGH (ref 80.0–98.0)
MONOCYTES: 2.9 % (ref 1–13)
MPV: 10.2 fL — ABNORMAL HIGH (ref 6.0–10.0)
NEUTROPHILS: 76.5 % — ABNORMAL HIGH (ref 34–64)
NRBC: 0 (ref 0–0)
PLATELET: 107 10*3/uL — ABNORMAL LOW (ref 140–450)
RBC: 3.43 M/uL — ABNORMAL LOW (ref 3.60–5.20)
RDW-SD: 58.4 — ABNORMAL HIGH (ref 36.4–46.3)
WBC: 3.5 10*3/uL — ABNORMAL LOW (ref 4.0–11.0)

## 2017-12-22 LAB — METABOLIC PANEL, COMPREHENSIVE
ALT (SGPT): 36 U/L (ref 12–78)
AST (SGOT): 20 U/L (ref 15–37)
Albumin: 3.5 gm/dl (ref 3.4–5.0)
Alk. phosphatase: 103 U/L (ref 45–117)
Anion gap: 11 mmol/L (ref 5–15)
BUN: 12 mg/dl (ref 7–25)
Bilirubin, total: 1.1 mg/dl — ABNORMAL HIGH (ref 0.2–1.0)
CO2: 23 mEq/L (ref 21–32)
Calcium: 8.8 mg/dl (ref 8.5–10.1)
Chloride: 106 mEq/L (ref 98–107)
Creatinine: 0.9 mg/dl (ref 0.6–1.3)
GFR est AA: >60
GFR est non-AA: >60
Glucose: 155 mg/dl — ABNORMAL HIGH (ref 74–106)
Potassium: 4.1 mEq/L (ref 3.5–5.1)
Protein, total: 6 gm/dl — ABNORMAL LOW (ref 6.4–8.2)
Sodium: 140 mEq/L (ref 136–145)

## 2017-12-22 LAB — MAGNESIUM
Magnesium: 1.4 mg/dl — ABNORMAL LOW (ref 1.6–2.6)
Magnesium: 1.4 mg/dl — ABNORMAL LOW (ref 1.6–2.6)

## 2017-12-22 LAB — COMPREHENSIVE METABOLIC PANEL
ALT: 36 U/L (ref 12–78)
AST: 20 U/L (ref 15–37)
Albumin: 3.5 gm/dl (ref 3.4–5.0)
Alkaline Phosphatase: 103 U/L (ref 45–117)
Anion Gap: 11 mmol/L (ref 5–15)
BUN: 12 mg/dl (ref 7–25)
CO2: 23 mEq/L (ref 21–32)
Calcium: 8.8 mg/dl (ref 8.5–10.1)
Chloride: 106 mEq/L (ref 98–107)
Creatinine: 0.9 mg/dl (ref 0.6–1.3)
EGFR IF NonAfrican American: >60
GFR African American: >60
Glucose: 155 mg/dl — ABNORMAL HIGH (ref 74–106)
Potassium: 4.1 mEq/L (ref 3.5–5.1)
Sodium: 140 mEq/L (ref 136–145)
Total Bilirubin: 1.1 mg/dl — ABNORMAL HIGH (ref 0.2–1.0)
Total Protein: 6 gm/dl — ABNORMAL LOW (ref 6.4–8.2)

## 2017-12-22 LAB — CBC WITH AUTO DIFFERENTIAL
Basophils %: 0.3 % (ref 0–3)
Eosinophils %: 0.6 % (ref 0–5)
Hematocrit: 34.1 % — ABNORMAL LOW (ref 37.0–50.0)
Hemoglobin: 11.8 gm/dl — ABNORMAL LOW (ref 13.0–17.2)
Immature Granulocytes: 1.2 % (ref 0.0–3.0)
Lymphocytes %: 18.5 % — ABNORMAL LOW (ref 28–48)
MCH: 34.4 pg (ref 25.4–34.6)
MCHC: 34.6 gm/dl (ref 30.0–36.0)
MCV: 99.4 fL — ABNORMAL HIGH (ref 80.0–98.0)
MPV: 10.2 fL — ABNORMAL HIGH (ref 6.0–10.0)
Monocytes %: 2.9 % (ref 1–13)
Neutrophils %: 76.5 % — ABNORMAL HIGH (ref 34–64)
Nucleated RBCs: 0 (ref 0–0)
Platelets: 107 10*3/uL — ABNORMAL LOW (ref 140–450)
RBC: 3.43 M/uL — ABNORMAL LOW (ref 3.60–5.20)
RDW-SD: 58.4 — ABNORMAL HIGH (ref 36.4–46.3)
WBC: 3.5 10*3/uL — ABNORMAL LOW (ref 4.0–11.0)

## 2017-12-23 LAB — CMV BY PCR, QT
CMV IU/mL: <200 IU/mL (ref <200)
CMV log IU/mL: <2.3 log IU/mL (ref <2.30)

## 2017-12-23 LAB — CMV BY PCR QUANTITATIVE
CMV IU/ML: <200 IU/mL (ref <200)
CMV LOG IU/ML: <2.3 log IU/mL (ref <2.30)

## 2017-12-24 ENCOUNTER — Emergency Department: Payer: BLUE CROSS/BLUE SHIELD | Primary: Internal Medicine

## 2017-12-24 ENCOUNTER — Emergency Department: Admit: 2017-12-25 | Payer: BLUE CROSS/BLUE SHIELD | Primary: Internal Medicine

## 2017-12-24 DIAGNOSIS — T80211A Bloodstream infection due to central venous catheter, initial encounter: Secondary | ICD-10-CM

## 2017-12-24 LAB — TACROLIMUS, WHOLE BLOOD
TACROLIMUS,XTACR1: 8.4 mcg/L
Tacrolimus level: 8.4 mcg/L

## 2017-12-24 NOTE — ED Provider Notes (Signed)
ED Provider Notes by Rolinda Roan, DO at 12/24/17 2109                Author: Rolinda Roan, DO  Service: Emergency Medicine  Author Type: Resident       Filed: 12/25/17 0011  Date of Service: 12/24/17 2109  Status: Attested           Editor: Rolinda Roan, DO (Resident)  Cosigner: Lindajo Royal, MD at 12/25/17 1634          Attestation signed by Lindajo Royal, MD at 12/25/17 1634          I, Lindajo Royal, M.D., interviewed and examined the patient. I discussed with the resident and agree with the evaluation and plans as documented here.      Diplopia/vision changes beginning 2 days prior to arrival.  Patient also had transient speech difficulty and left-sided weakness/sensory change at onset.  The symptoms have since resolved.  Leukemia status post bone marrow transplant.  On immunosuppressants.   CT head shows no urgent pathology.  Concern with TIA/CVA.  Patient is admitted to hospitalist for further evaluation.                                 [facility]   Emergency Department Treatment Report          Patient: [name] Wu  Age: 57 y.o.  Sex: female          Date of Birth: 08/07/1960  Admit Date: 12/24/2017  PCP: Lacey Jensen, MD     MRN: [id number]   CSN: [id number]            Room: ER27/ER27  Time Dictated: 9:09 PM             Chief Complaint      Chief Complaint       Patient presents with        ?  Nausea     ?  Double Vision        ?  Diarrhea             History of Present Illness     57 y.o. female  presents with 72 hours of diplopia, N/V/D and throat pain.  She reports that 3 days ago she had acute onset of diplopia, difficulty speaking, and numbness involving the whole L-side of her body.  She took her blood sugar and it was 70, she had 2 glasses  of orange juice and reported improvement in her sx, although the diplopia persisted.  She has also had N/V/D that started 2 days prior to her neuro sx.  She endorses some throat pain as well.  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  She has had many complications with GVHD since, with both skin manifestations and esophagitis.  She follows with an oncologist here, Dr. Edd Arbour.        Review of Systems     Review of Systems    Constitutional: Negative for chills and fever.    HENT: Negative for facial swelling and voice change.     Eyes: Positive for visual disturbance.    Respiratory: Negative for cough, chest tightness, shortness of breath, wheezing and stridor.     Cardiovascular: Negative for chest pain and leg swelling.  Gastrointestinal: Positive for diarrhea, nausea  and vomiting. Negative for abdominal distention and abdominal pain.    Genitourinary: Negative for difficulty urinating, dysuria, flank pain and hematuria.    Musculoskeletal: Negative for arthralgias and myalgias.    Skin: Negative for rash.    Neurological: Negative for seizures and syncope.    Psychiatric/Behavioral: Negative for agitation and confusion.    All other systems reviewed and are negative.            Past Medical/Surgical History          Past Medical History:        Diagnosis  Date         ?  Diabetes ([place])       ?  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  H/O stem cell transplant ([place])  041/04/19     ?  Hx antineoplastic chemotherapy       ?  Hypertension           ?  Leukemia ([place])          History reviewed. No pertinent surgical history.        Social History          Social History          Socioeconomic History         ?  Marital status:  DIVORCED              Spouse name:  Not on file         ?  Number of children:  Not on file     ?  Years of education:  Not on file     ?  Highest education level:  Not on file       Tobacco Use         ?  Smoking status:  Never Smoker     ?  Smokeless tobacco:  Never Used       Substance and Sexual Activity         ?  Alcohol use:  No              Frequency:  Never         ?  Drug use:  No             Family History     History reviewed. No  pertinent family history.        Current Medications          Prior to Admission Medications     Prescriptions  Last Dose  Informant  Patient Reported?  Taking?      acyclovir (ZOVIRAX) 400 mg tablet  12/24/2017 at Unknown time    Yes  Yes      Sig: Take 400 mg by mouth two (2) times a day.      insulin degludec (TRESIBA FLEXTOUCH U-200) 200 unit/mL (3 mL) inpn  12/23/2017 at Unknown time    Yes  Yes      Sig: 30 Units by SubCUTAneous route daily.      insulin lispro (HUMALOG JUNIOR KWIKPEN U-100) 100 unit/mL inph  12/24/2017 at Unknown time    Yes  Yes      Sig: by SubCUTAneous route three (3) times daily.      oxyCODONE IR (ROXICODONE) 5 mg immediate release tablet  11/23/2017 at Unknown time    No  Yes      Sig: Take 2 Tabs by  mouth every four (4) hours as needed. Max Daily Amount: 60 mg.      potassium chloride SR (K-TAB) 20 mEq tablet  12/24/2017 at Unknown time    Yes  Yes      Sig: Take 40 mEq by mouth two (2) times a day.      zolpidem (AMBIEN) 5 mg tablet  11/23/2017 at Unknown time    Yes  Yes      Sig: Take 5 mg by mouth nightly.               Facility-Administered Medications: None          Allergies          Allergies        Allergen  Reactions         ?  Pcn [Penicillins]  Hives and Itching          Physical Exam          ED Triage Vitals     ED Encounter Vitals Group            BP  12/24/17 2051  113/71        Pulse (Heart Rate)  12/24/17 2051  (!) 112        Resp Rate  12/24/17 2051  24        Temp  12/24/17 2051  98.6 ??F (37 ??C)        Temp src  --          O2 Sat (%)  12/24/17 2051  100 %        Weight  12/24/17 2034  144 lb            Height  12/24/17 2034  5'            Physical Exam    Constitutional: She is oriented to person, place, and time. She appears well-developed and well-nourished.  Non-toxic appearance. She does not appear ill. No distress.    HENT:    Head: Normocephalic and atraumatic.    Eyes: Pupils are equal, round, and reactive to light. EOM are normal.    Neck: Neck supple. No JVD  present. No tracheal deviation present.    Cardiovascular: Regular rhythm, normal heart sounds and intact distal pulses. Tachycardia present.    Pulmonary/Chest: Effort normal and breath sounds normal. No stridor. No respiratory distress. She has no wheezes. She has no rales.    Abdominal: Soft. Normal appearance and bowel sounds are normal. She exhibits no distension. There is no tenderness. There is no rigidity, no rebound, no CVA tenderness, no tenderness at McBurney's point and negative Murphy's sign.   Musculoskeletal: She exhibits no edema.    Neurological: She is alert and oriented to person, place, and time. No cranial nerve deficit.  Coordination abnormal.   Ataxic finger to nose    Skin: Skin is warm and dry. No rash noted. She is not diaphoretic. No erythema.   Psychiatric: She has a normal mood and affect.     Nursing note and vitals reviewed.         Diagnostic Studies     Lab:      Recent Results (from the past 12 hour(s))     METABOLIC PANEL, BASIC          Collection Time: 12/24/17 10:30 PM         Result  Value  Ref Range  Sodium  139  136 - 145 mEq/L       Potassium  4.4  3.5 - 5.1 mEq/L       Chloride  105  98 - 107 mEq/L       CO2  25  21 - 32 mEq/L       Glucose  239 (H)  74 - 106 mg/dl       BUN  10  7 - 25 mg/dl       Creatinine  0.8  0.6 - 1.3 mg/dl       GFR est AA  >60.0          GFR est non-AA  >60          Calcium  8.4 (L)  8.5 - 10.1 mg/dl       Anion gap  9  5 - 15 mmol/L       HEPATIC FUNCTION PANEL          Collection Time: 12/24/17 10:30 PM         Result  Value  Ref Range            AST (SGOT)  16  15 - 37 U/L       ALT (SGPT)  29  12 - 78 U/L       Alk. phosphatase  96  45 - 117 U/L       Bilirubin, total  0.4  0.2 - 1.0 mg/dl       Protein, total  5.5 (L)  6.4 - 8.2 gm/dl       Albumin  3.3 (L)  3.4 - 5.0 gm/dl       Bilirubin, direct  0.1  0.0 - 0.2 mg/dl       LIPASE          Collection Time: 12/24/17 10:30 PM         Result  Value  Ref Range            Lipase  41 (L)   73 - 393 U/L       MAGNESIUM          Collection Time: 12/24/17 10:30 PM         Result  Value  Ref Range            Magnesium  2.2  1.6 - 2.6 mg/dl       LACTIC ACID          Collection Time: 12/24/17 10:30 PM         Result  Value  Ref Range            Lactic Acid  1.7  0.4 - 2.0 mmol/L       TROPONIN I          Collection Time: 12/24/17 10:30 PM         Result  Value  Ref Range            Troponin-I  <0.015  0.000 - 0.045 ng/ml       TSH 3RD GENERATION          Collection Time: 12/24/17 10:30 PM         Result  Value  Ref Range            TSH  1.050  0.358 - 3.740 uIU/mL       POC URINE MACROSCOPIC          Collection Time: 12/24/17 10:56 PM  Result  Value  Ref Range            Glucose  100 (A)  NEGATIVE,Negative mg/dl       Bilirubin  Negative  NEGATIVE,Negative         Ketone  Negative  NEGATIVE,Negative mg/dl       Specific gravity  1.015  1.005 - 1.030         Blood  Negative  NEGATIVE,Negative         pH (UA)  6.5  5 - 9         Protein  Negative  NEGATIVE,Negative mg/dl       Urobilinogen  0.2  0.0 - 1.0 EU/dl       Nitrites  Negative  NEGATIVE,Negative         Leukocyte Esterase  Trace (A)  NEGATIVE,Negative         Color  Yellow          Appearance  Clear          POC URINE MICROSCOPIC          Collection Time: 12/24/17 10:56 PM         Result  Value  Ref Range            Epithelial cells, squamous  5-9  /LPF       WBC  1-4  /HPF       Bacteria  OCCASIONAL  /HPF       Hyaline cast  OCCASIONAL  /LPF       POC CREATININE          Collection Time: 12/24/17 10:56 PM         Result  Value  Ref Range            Creatinine  0.7  0.6 - 1.3 mg/dl           Imaging:     Xr Chest Pa Lat      Result Date: 12/24/2017   Chest Indication: hx of GVHD with N/V/D.    Comparison: 05/20/2017 Findings: Frontal and lateral views of the chest demonstrate that the cardiac silhouette is not enlarged.  There is no focal infiltrate, pleural effusion, vascular congestion, or pneumothorax.   Left PICC with tip overlying the  distal superior vena cava.       Impression: No acute cardiopulmonary disease.          EKG: sinus tachycardia.        ED Course/Medical Decision Making        NIHSS 0, evaluated by stroke RN.  Sx onset 72 hours ago.      Pt well appearing and non-toxic on exam.      ECHO 04/2017, EF 17%, grade I diastolic dysfunction      Trop neg.  CMP WNL.  UA without evidence of UTI.  CXR without acute intrathoracic process.      Low suspicion for sepsis at this time.  CBC still pending.      D/W hospitalist for admission for TIA W/U.         Medications       iopamidol (ISOVUE-370) 76 % injection 80 mL (has no administration in time range)     aspirin chewable tablet 324 mg (has no administration in time range)       sodium chloride 0.9 % bolus infusion 1,000 mL (1,000 mL IntraVENous New Bag 12/24/17 2256)        Procedures  Final Diagnosis                 ICD-10-CM  ICD-9-CM          1.  Diplopia  H53.2  368.2          2.  TIA (transient ischemic attack)  G45.9  435.9             Disposition     Stable, admitted      Rolinda Roan, DO   December 25, 2017         Dragon dictation software was used for portions of this report. Unintended errors in transcription may occur.      My signature above authenticates this document and my orders, the final     diagnosis (es), discharge prescription (s), and instructions in the Epic     record.   If you have any questions please contact [phone].       Nursing notes have been reviewed by the physician/ advanced practice     Clinician.

## 2017-12-24 NOTE — ED Notes (Signed)
Patient is in remission from ALL and received stem cell transplant 08/14/17. 2 days ago had episode of nausea, sweating, vomiting, diarrhea, double vision, palpitations, imbalance, and throat constriction. BGL  Was 56. Also was unable to speak with left side of face and left arm numbness.

## 2017-12-24 NOTE — Progress Notes (Signed)
 Initial Stroke Team Assessment:  Patient arrived in the ER this PM with complaints of Diplopia, nausea, vomiting and diarrhea. States that she had not feeling well since Saturday, but that the stroke like symptoms began on Monday of this week. States that she had double vision, L sided weakness, difficulty with her speech, and had trouble maintaining her balance, but feels it was more related to her vision and not being able to see well. States that she checked her glucose levels and they were 70, she drank 2 glasses of OJ and symptoms seemed to improve, except for the diplopia, which she still states she is experiencing.   On assessment, patient is grossly intact neurologically, denies headache, is able to see without difficulty on exam except some squinting noted when she was signing her name. No ataxia noted, no speech related issues, and was able to follow all commands without difficulty.  Discussed assessment findings with Emeline Sobers, DO.    LKW: 12/22/2017    NIH = 0    Dysphagia screen passed, patient states that she has been having some difficulty swallowing recently. States that it feels like her throat is constricting sometimes when eating, drinking and sometimes just talking she has a hard time swallowing her own saliva.    Education completed.     Will follow up this admission.

## 2017-12-24 NOTE — Progress Notes (Signed)
Initial Stroke Team Assessment:  Patient arrived in the ER this PM with complaints of Diplopia, nausea, vomiting and diarrhea. States that she had not feeling well since Saturday, but that the "stroke like symptoms" began on Monday of this week. States that she had double vision, L sided weakness, difficulty with her speech, and had trouble maintaining her balance, but feels it was more related to her vision and not being able to see well. States that she checked her glucose levels and they were 70, she drank 2 glasses of OJ and symptoms seemed to improve, except for the diplopia, which she still states she is experiencing.   On assessment, patient is grossly intact neurologically, denies headache, is able to see without difficulty on exam except some squinting noted when she was signing her name. No ataxia noted, no speech related issues, and was able to follow all commands without difficulty.  Discussed assessment findings with Milly Jakob, DO.    LKW: 12/22/2017    NIH = 0    Dysphagia screen passed, patient states that she has been having some difficulty swallowing recently. States that it feels like her throat is constricting sometimes when eating, drinking and sometimes just talking she has a hard time swallowing her own saliva.    Education completed.     Will follow up this admission.

## 2017-12-24 NOTE — ED Triage Notes (Signed)
Patient is in remission from ALL and received stem cell transplant 08/14/17. 2 days ago had episode of nausea, sweating, vomiting, diarrhea, double vision, palpitations, imbalance, and throat constriction. BGL  Was 81. Also was unable to speak with left side of face and left arm numbness.

## 2017-12-24 NOTE — ED Provider Notes (Signed)
[place]  Emergency Department Treatment Report    Patient: [name] Wu Age: 57 y.o. Sex: female    Date of Birth: 08-Feb-1961 Admit Date: 12/24/2017 PCP: Lacey Jensen, MD   MRN: [id number]  CSN: [id number]     Room: ER27/ER27 Time Dictated: 9:09 PM        Chief Complaint   Chief Complaint   Patient presents with   ??? Nausea   ??? Double Vision   ??? Diarrhea       History of Present Illness   57 y.o. female presents with 72 hours of diplopia, N/V/D and throat pain.  She reports that 3 days ago she had acute onset of diplopia, difficulty speaking, and numbness involving the whole L-side of her body.  She took her blood sugar and it was 70, she had 2 glasses of orange juice and reported improvement in her sx, although the diplopia persisted.  She has also had N/V/D that started 2 days prior to her neuro sx.  She endorses some throat pain as well.  She has a hx of ALL, recently tx with BMT in April 2019 at [facility] in [place].  She has had many complications with GVHD since, with both skin manifestations and esophagitis.  She follows with an oncologist here, Dr. Edd Arbour.    Review of Systems   Review of Systems   Constitutional: Negative for chills and fever.   HENT: Negative for facial swelling and voice change.    Eyes: Positive for visual disturbance.   Respiratory: Negative for cough, chest tightness, shortness of breath, wheezing and stridor.    Cardiovascular: Negative for chest pain and leg swelling.   Gastrointestinal: Positive for diarrhea, nausea and vomiting. Negative for abdominal distention and abdominal pain.   Genitourinary: Negative for difficulty urinating, dysuria, flank pain and hematuria.   Musculoskeletal: Negative for arthralgias and myalgias.   Skin: Negative for rash.   Neurological: Negative for seizures and syncope.   Psychiatric/Behavioral: Negative for agitation and confusion.   All other systems reviewed and are negative.       Past Medical/Surgical History      Past Medical History:   Diagnosis Date   ??? Diabetes ([place])    ??? 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 H/O stem cell transplant ([place]) 041/04/19   ??? Hx antineoplastic chemotherapy    ??? Hypertension    ??? Leukemia ([place])      History reviewed. No pertinent surgical history.    Social History     Social History     Socioeconomic History   ??? Marital status: DIVORCED     Spouse name: Not on file   ??? Number of children: Not on file   ??? Years of education: Not on file   ??? Highest education level: Not on file   Tobacco Use   ??? Smoking status: Never Smoker   ??? Smokeless tobacco: Never Used   Substance and Sexual Activity   ??? Alcohol use: No     Frequency: Never   ??? Drug use: No       Family History   History reviewed. No pertinent family history.    Current Medications     Prior to Admission Medications   Prescriptions Last Dose Informant Patient Reported? Taking?   acyclovir (ZOVIRAX) 400 mg tablet 12/24/2017 at Unknown time  Yes Yes   Sig: Take 400 mg by mouth two (2) times a day.   insulin degludec (  TRESIBA FLEXTOUCH U-200) 200 unit/mL (3 mL) inpn 12/23/2017 at Unknown time  Yes Yes   Sig: 30 Units by SubCUTAneous route daily.   insulin lispro (HUMALOG JUNIOR KWIKPEN U-100) 100 unit/mL inph 12/24/2017 at Unknown time  Yes Yes   Sig: by SubCUTAneous route three (3) times daily.   oxyCODONE IR (ROXICODONE) 5 mg immediate release tablet 11/23/2017 at Unknown time  No Yes   Sig: Take 2 Tabs by mouth every four (4) hours as needed. Max Daily Amount: 60 mg.   potassium chloride SR (K-TAB) 20 mEq tablet 12/24/2017 at Unknown time  Yes Yes   Sig: Take 40 mEq by mouth two (2) times a day.   zolpidem (AMBIEN) 5 mg tablet 11/23/2017 at Unknown time  Yes Yes   Sig: Take 5 mg by mouth nightly.      Facility-Administered Medications: None     Allergies     Allergies   Allergen Reactions   ??? Pcn [Penicillins] Hives and Itching     Physical Exam     ED Triage Vitals   ED Encounter Vitals Group      BP 12/24/17 2051 113/71       Pulse (Heart Rate) 12/24/17 2051 (!) 112      Resp Rate 12/24/17 2051 24      Temp 12/24/17 2051 98.6 ??F (37 ??C)      Temp src --       O2 Sat (%) 12/24/17 2051 100 %      Weight 12/24/17 2034 144 lb      Height 12/24/17 2034 5'        Physical Exam   Constitutional: She is oriented to person, place, and time. She appears well-developed and well-nourished.  Non-toxic appearance. She does not appear ill. No distress.   HENT:   Head: Normocephalic and atraumatic.   Eyes: Pupils are equal, round, and reactive to light. EOM are normal.   Neck: Neck supple. No JVD present. No tracheal deviation present.   Cardiovascular: Regular rhythm, normal heart sounds and intact distal pulses. Tachycardia present.   Pulmonary/Chest: Effort normal and breath sounds normal. No stridor. No respiratory distress. She has no wheezes. She has no rales.   Abdominal: Soft. Normal appearance and bowel sounds are normal. She exhibits no distension. There is no tenderness. There is no rigidity, no rebound, no CVA tenderness, no tenderness at McBurney's point and negative Murphy's sign.   Musculoskeletal: She exhibits no edema.   Neurological: She is alert and oriented to person, place, and time. No cranial nerve deficit. Coordination abnormal.   Ataxic finger to nose   Skin: Skin is warm and dry. No rash noted. She is not diaphoretic. No erythema.   Psychiatric: She has a normal mood and affect.   Nursing note and vitals reviewed.     Diagnostic Studies   Lab:   Recent Results (from the past 12 hour(s))   METABOLIC PANEL, BASIC    Collection Time: 12/24/17 10:30 PM   Result Value Ref Range    Sodium 139 136 - 145 mEq/L    Potassium 4.4 3.5 - 5.1 mEq/L    Chloride 105 98 - 107 mEq/L    CO2 25 21 - 32 mEq/L    Glucose 239 (H) 74 - 106 mg/dl    BUN 10 7 - 25 mg/dl    Creatinine 0.8 0.6 - 1.3 mg/dl    GFR est AA >60.0      GFR est non-AA >60  Calcium 8.4 (L) 8.5 - 10.1 mg/dl    Anion gap 9 5 - 15 mmol/L   HEPATIC FUNCTION PANEL     Collection Time: 12/24/17 10:30 PM   Result Value Ref Range    AST (SGOT) 16 15 - 37 U/L    ALT (SGPT) 29 12 - 78 U/L    Alk. phosphatase 96 45 - 117 U/L    Bilirubin, total 0.4 0.2 - 1.0 mg/dl    Protein, total 5.5 (L) 6.4 - 8.2 gm/dl    Albumin 3.3 (L) 3.4 - 5.0 gm/dl    Bilirubin, direct 0.1 0.0 - 0.2 mg/dl   LIPASE    Collection Time: 12/24/17 10:30 PM   Result Value Ref Range    Lipase 41 (L) 73 - 393 U/L   MAGNESIUM    Collection Time: 12/24/17 10:30 PM   Result Value Ref Range    Magnesium 2.2 1.6 - 2.6 mg/dl   LACTIC ACID    Collection Time: 12/24/17 10:30 PM   Result Value Ref Range    Lactic Acid 1.7 0.4 - 2.0 mmol/L   TROPONIN I    Collection Time: 12/24/17 10:30 PM   Result Value Ref Range    Troponin-I <0.015 0.000 - 0.045 ng/ml   TSH 3RD GENERATION    Collection Time: 12/24/17 10:30 PM   Result Value Ref Range    TSH 1.050 0.358 - 3.740 uIU/mL   POC URINE MACROSCOPIC    Collection Time: 12/24/17 10:56 PM   Result Value Ref Range    Glucose 100 (A) NEGATIVE,Negative mg/dl    Bilirubin Negative NEGATIVE,Negative      Ketone Negative NEGATIVE,Negative mg/dl    Specific gravity 1.015 1.005 - 1.030      Blood Negative NEGATIVE,Negative      pH (UA) 6.5 5 - 9      Protein Negative NEGATIVE,Negative mg/dl    Urobilinogen 0.2 0.0 - 1.0 EU/dl    Nitrites Negative NEGATIVE,Negative      Leukocyte Esterase Trace (A) NEGATIVE,Negative      Color Yellow      Appearance Clear     POC URINE MICROSCOPIC    Collection Time: 12/24/17 10:56 PM   Result Value Ref Range    Epithelial cells, squamous 5-9 /LPF    WBC 1-4 /HPF    Bacteria OCCASIONAL /HPF    Hyaline cast OCCASIONAL /LPF   POC CREATININE    Collection Time: 12/24/17 10:56 PM   Result Value Ref Range    Creatinine 0.7 0.6 - 1.3 mg/dl       Imaging:    Xr Chest Pa Lat    Result Date: 12/24/2017  Chest Indication: hx of GVHD with N/V/D.    Comparison: 05/20/2017 Findings: Frontal and lateral views of the chest demonstrate that the cardiac  silhouette is not enlarged.  There is no focal infiltrate, pleural effusion, vascular congestion, or pneumothorax.  Left PICC with tip overlying the distal superior vena cava.     Impression: No acute cardiopulmonary disease.       EKG: sinus tachycardia.    ED Course/Medical Decision Making     NIHSS 0, evaluated by stroke RN.  Sx onset 72 hours ago.    Pt well appearing and non-toxic on exam.    ECHO 04/2017, EF 82%, grade I diastolic dysfunction    Trop neg.  CMP WNL.  UA without evidence of UTI.  CXR without acute intrathoracic process.    Low suspicion for sepsis at  this time.  CBC still pending.    D/W hospitalist for admission for TIA W/U.     Medications   iopamidol (ISOVUE-370) 76 % injection 80 mL (has no administration in time range)   aspirin chewable tablet 324 mg (has no administration in time range)   sodium chloride 0.9 % bolus infusion 1,000 mL (1,000 mL IntraVENous New Bag 12/24/17 2256)     Procedures   Final Diagnosis       ICD-10-CM ICD-9-CM   1. Diplopia H53.2 368.2   2. TIA (transient ischemic attack) G45.9 435.9       Disposition   Stable, admitted    Rolinda Roan, DO  December 25, 2017      Dragon dictation software was used for portions of this report. Unintended errors in transcription may occur.    My signature above authenticates this document and my orders, the final    diagnosis (es), discharge prescription (s), and instructions in the Epic    record.  If you have any questions please contact [phone].     Nursing notes have been reviewed by the physician/ advanced practice    Clinician.

## 2017-12-25 ENCOUNTER — Inpatient Hospital Stay
Admit: 2017-12-25 | Discharge: 2018-01-02 | Disposition: A | Discharge status: Home Health | Payer: BLUE CROSS/BLUE SHIELD | Attending: Internal Medicine | Admitting: Internal Medicine

## 2017-12-25 ENCOUNTER — Inpatient Hospital Stay: Payer: BLUE CROSS/BLUE SHIELD | Primary: Internal Medicine

## 2017-12-25 LAB — EKG, 12 LEAD, INITIAL
Atrial Rate: 104 {beats}/min
Calculated P Axis: 18 degrees
Calculated R Axis: 17 degrees
Calculated T Axis: 16 degrees
P-R Interval: 148 ms
Q-T Interval: 328 ms
QRS Duration: 66 ms
QTC Calculation (Bezet): 431 ms
Ventricular Rate: 104 {beats}/min

## 2017-12-25 LAB — HEPATIC FUNCTION PANEL
ALT (SGPT): 29 U/L (ref 12–78)
ALT: 29 U/L (ref 12–78)
AST (SGOT): 16 U/L (ref 15–37)
AST: 16 U/L (ref 15–37)
Albumin: 3.3 gm/dl — ABNORMAL LOW (ref 3.4–5.0)
Albumin: 3.3 gm/dl — ABNORMAL LOW (ref 3.4–5.0)
Alk. phosphatase: 96 U/L (ref 45–117)
Alkaline Phosphatase: 96 U/L (ref 45–117)
Bilirubin, Direct: 0.1 mg/dl (ref 0.0–0.2)
Bilirubin, direct: 0.1 mg/dl (ref 0.0–0.2)
Bilirubin, total: 0.4 mg/dl (ref 0.2–1.0)
Protein, total: 5.5 gm/dl — ABNORMAL LOW (ref 6.4–8.2)
Total Bilirubin: 0.4 mg/dl (ref 0.2–1.0)
Total Protein: 5.5 gm/dl — ABNORMAL LOW (ref 6.4–8.2)

## 2017-12-25 LAB — CBC WITH AUTOMATED DIFF
BASOPHILS: 0 % (ref 0–3)
EOSINOPHILS: 1 % (ref 0–5)
HCT: 29.5 % — ABNORMAL LOW (ref 37.0–50.0)
HGB: 10.4 gm/dl — ABNORMAL LOW (ref 13.0–17.2)
IMMATURE GRANULOCYTES: 1.4 % (ref 0.0–3.0)
LYMPHOCYTES: 25.2 % — ABNORMAL LOW (ref 28–48)
MCH: 34.4 pg (ref 25.4–34.6)
MCHC: 35.3 gm/dl (ref 30.0–36.0)
MCV: 97.7 fL (ref 80.0–98.0)
MONOCYTES: 9.1 % (ref 1–13)
MPV: 10.5 fL — ABNORMAL HIGH (ref 6.0–10.0)
NEUTROPHILS: 63.3 % (ref 34–64)
NRBC: 0 (ref 0–0)
PLATELET: 55 10*3/uL — ABNORMAL LOW (ref 140–450)
RBC: 3.02 M/uL — ABNORMAL LOW (ref 3.60–5.20)
RDW-SD: 54.7 — ABNORMAL HIGH (ref 36.4–46.3)
WBC: 2.9 10*3/uL — ABNORMAL LOW (ref 4.0–11.0)

## 2017-12-25 LAB — SED RATE (ESR): Sed rate (ESR): 45 mm/Hr — ABNORMAL HIGH (ref 0–30)

## 2017-12-25 LAB — POC URINE MICROSCOPIC

## 2017-12-25 LAB — METABOLIC PANEL, BASIC
Anion gap: 9 mmol/L (ref 5–15)
BUN: 10 mg/dl (ref 7–25)
CO2: 25 mEq/L (ref 21–32)
Calcium: 8.4 mg/dl — ABNORMAL LOW (ref 8.5–10.1)
Chloride: 105 mEq/L (ref 98–107)
Creatinine: 0.8 mg/dl (ref 0.6–1.3)
GFR est AA: >60
GFR est non-AA: >60
Glucose: 239 mg/dl — ABNORMAL HIGH (ref 74–106)
Potassium: 4.4 mEq/L (ref 3.5–5.1)
Sodium: 139 mEq/L (ref 136–145)

## 2017-12-25 LAB — POC URINE MACROSCOPIC
Bilirubin, Urine: NEGATIVE
Bilirubin: NEGATIVE
Blood, Urine: NEGATIVE
Blood: NEGATIVE
Glucose, Ur: 100 mg/dl — AB
Glucose: 100 mg/dl — AB
Ketone: NEGATIVE mg/dl
Ketones, Urine: NEGATIVE mg/dl
Nitrite, Urine: NEGATIVE
Nitrites: NEGATIVE
Protein, UA: NEGATIVE mg/dl
Protein: NEGATIVE mg/dl
Specific Gravity, UA: 1.015 (ref 1.005–1.030)
Specific gravity: 1.015 (ref 1.005–1.030)
Urobilinogen, UA, POCT: 0.2 EU/dl (ref 0.0–1.0)
Urobilinogen: 0.2 EU/dl (ref 0.0–1.0)
pH (UA): 6.5 (ref 5–9)
pH, UA: 6.5 (ref 5–9)

## 2017-12-25 LAB — TSH 3RD GENERATION
TSH: 1.05 u[IU]/mL (ref 0.358–3.740)
TSH: 1.05 u[IU]/mL (ref 0.358–3.740)

## 2017-12-25 LAB — GLUCOSE, POC
Glucose (POC): 133 mg/dL — ABNORMAL HIGH (ref 65–105)
Glucose (POC): 153 mg/dL — ABNORMAL HIGH (ref 65–105)
Glucose (POC): 213 mg/dL — ABNORMAL HIGH (ref 65–105)

## 2017-12-25 LAB — MAGNESIUM
Magnesium: 2.2 mg/dl (ref 1.6–2.6)
Magnesium: 2.2 mg/dl (ref 1.6–2.6)

## 2017-12-25 LAB — TACROLIMUS, WHOLE BLOOD
TACROLIMUS,XTACR1: 8.9 mcg/L
Tacrolimus level: 8.9 mcg/L

## 2017-12-25 LAB — CK
CK: 24 U/L — ABNORMAL LOW (ref 26–192)
Total CK: 24 U/L — ABNORMAL LOW (ref 26–192)

## 2017-12-25 LAB — LACTIC ACID
Lactic Acid: 1.7 mmol/L (ref 0.4–2.0)
Lactic Acid: 1.7 mmol/L (ref 0.4–2.0)

## 2017-12-25 LAB — PROTHROMBIN TIME + INR
INR: 1 (ref 0.1–1.1)
Prothrombin time: 12.1 seconds (ref 10.2–12.9)

## 2017-12-25 LAB — CREATININE, POC: Creatinine: 0.7 mg/dl (ref 0.6–1.3)

## 2017-12-25 LAB — LIPASE
Lipase: 41 U/L — ABNORMAL LOW (ref 73–393)
Lipase: 41 U/L — ABNORMAL LOW (ref 73–393)

## 2017-12-25 LAB — RHEUMATOID FACTOR, QL: Rheumatoid factor: <10 IU/ml (ref 0–14)

## 2017-12-25 LAB — TROPONIN I: Troponin-I: <0.015 ng/ml (ref 0.000–0.045)

## 2017-12-25 LAB — EKG 12-LEAD
Atrial Rate: 104 {beats}/min
P Axis: 18 degrees
P-R Interval: 148 ms
Q-T Interval: 328 ms
QRS Duration: 66 ms
QTc Calculation (Bazett): 431 ms
R Axis: 17 degrees
T Axis: 16 degrees
Ventricular Rate: 104 {beats}/min

## 2017-12-25 LAB — CBC WITH AUTO DIFFERENTIAL
Basophils %: 0 % (ref 0–3)
Eosinophils %: 1 % (ref 0–5)
Hematocrit: 29.5 % — ABNORMAL LOW (ref 37.0–50.0)
Hemoglobin: 10.4 gm/dl — ABNORMAL LOW (ref 13.0–17.2)
Immature Granulocytes %: 1.4 % (ref 0.0–3.0)
Lymphocytes %: 25.2 % — ABNORMAL LOW (ref 28–48)
MCH: 34.4 pg (ref 25.4–34.6)
MCHC: 35.3 gm/dl (ref 30.0–36.0)
MCV: 97.7 fL (ref 80.0–98.0)
MPV: 10.5 fL — ABNORMAL HIGH (ref 6.0–10.0)
Monocytes %: 9.1 % (ref 1–13)
Neutrophils %: 63.3 % (ref 34–64)
Nucleated RBCs: 0 (ref 0–0)
Platelets: 55 10*3/uL — ABNORMAL LOW (ref 140–450)
RBC: 3.02 M/uL — ABNORMAL LOW (ref 3.60–5.20)
RDW-SD: 54.7 — ABNORMAL HIGH (ref 36.4–46.3)
WBC: 2.9 10*3/uL — ABNORMAL LOW (ref 4.0–11.0)

## 2017-12-25 LAB — BASIC METABOLIC PANEL
Anion Gap: 9 mmol/L (ref 5–15)
BUN: 10 mg/dl (ref 7–25)
CO2: 25 mEq/L (ref 21–32)
Calcium: 8.4 mg/dl — ABNORMAL LOW (ref 8.5–10.1)
Chloride: 105 mEq/L (ref 98–107)
Creatinine: 0.8 mg/dl (ref 0.6–1.3)
GFR African American: >60
Glucose: 239 mg/dl — ABNORMAL HIGH (ref 74–106)
Potassium: 4.4 mEq/L (ref 3.5–5.1)
Sodium: 139 mEq/L (ref 136–145)
eGFR NON-AA: >60

## 2017-12-25 LAB — TROPONIN: Troponin I: <0.015 ng/ml (ref 0.000–0.045)

## 2017-12-25 LAB — POCT GLUCOSE
POC Glucose: 133 mg/dL — ABNORMAL HIGH (ref 65–105)
POC Glucose: 153 mg/dL — ABNORMAL HIGH (ref 65–105)
POC Glucose: 213 mg/dL — ABNORMAL HIGH (ref 65–105)

## 2017-12-25 LAB — RHEUMATOID FACTOR, QUALITATIVE: Rheumatoid Factor: <10 IU/ml (ref 0–14)

## 2017-12-25 LAB — SEDIMENTATION RATE: Sed Rate, Automated: 45 mm/h — ABNORMAL HIGH (ref 0–30)

## 2017-12-25 LAB — PROTIME-INR
INR: 1 (ref 0.1–1.1)
Protime: 12.1 seconds (ref 10.2–12.9)

## 2017-12-25 LAB — AMB POC CREATININE: Creatinine: 0.7 mg/dl (ref 0.6–1.3)

## 2017-12-25 MED ORDER — PHARMACY VANCOMYCIN NOTE
Freq: Once | Status: DC
Start: 2017-12-25 — End: 2017-12-26

## 2017-12-25 MED ORDER — ASPIRIN 81 MG CHEWABLE TAB
81 mg | ORAL | Status: AC
Start: 2017-12-25 — End: 2017-12-25
  Administered 2017-12-25: 05:00:00 via ORAL

## 2017-12-25 MED ORDER — LORAZEPAM 0.5 MG TAB
0.5 mg | ORAL | Status: DC
Start: 2017-12-25 — End: 2017-12-25

## 2017-12-25 MED ORDER — ACETAMINOPHEN 325 MG TABLET
325 mg | ORAL | Status: DC | PRN
Start: 2017-12-25 — End: 2018-01-02

## 2017-12-25 MED ORDER — VANCOMYCIN IN 0.9 % SODIUM CHLORIDE 1 GRAM/250 ML IV
1 gram/250 mL | Freq: Two times a day (BID) | INTRAVENOUS | Status: DC
Start: 2017-12-25 — End: 2017-12-28
  Administered 2017-12-26 – 2017-12-28 (×5): via INTRAVENOUS

## 2017-12-25 MED ORDER — BUDESONIDE SR 3 MG 24 HR CAP
3 mg | Freq: Three times a day (TID) | ORAL | Status: DC
Start: 2017-12-25 — End: 2017-12-25
  Administered 2017-12-25 (×2): via ORAL

## 2017-12-25 MED ORDER — VANCOMYCIN 10 GRAM IV SOLR
10 gram | Freq: Once | INTRAVENOUS | Status: AC
Start: 2017-12-25 — End: 2017-12-26

## 2017-12-25 MED ORDER — LORAZEPAM 0.5 MG TAB
0.5 mg | ORAL | Status: DC
Start: 2017-12-25 — End: 2018-01-02

## 2017-12-25 MED ORDER — INSULIN LISPRO 100 UNIT/ML INJECTION
100 unit/mL | SUBCUTANEOUS | Status: DC | PRN
Start: 2017-12-25 — End: 2018-01-02
  Administered 2017-12-26 – 2018-01-01 (×3): via SUBCUTANEOUS

## 2017-12-25 MED ORDER — INSULIN LISPRO 100 UNIT/ML INJECTION
100 unit/mL | Freq: Four times a day (QID) | SUBCUTANEOUS | Status: DC
Start: 2017-12-25 — End: 2018-01-02
  Administered 2017-12-26 – 2018-01-02 (×19): via SUBCUTANEOUS

## 2017-12-25 MED ORDER — NILOTINIB 200 MG CAPSULE
200 mg | Freq: Two times a day (BID) | ORAL | Status: DC
Start: 2017-12-25 — End: 2017-12-25

## 2017-12-25 MED ORDER — SODIUM CHLORIDE 0.9% BOLUS IV
0.9 % | Freq: Once | INTRAVENOUS | Status: AC
Start: 2017-12-25 — End: 2017-12-24
  Administered 2017-12-25: 03:00:00 via INTRAVENOUS

## 2017-12-25 MED ORDER — ACYCLOVIR 800 MG TAB
800 mg | Freq: Two times a day (BID) | ORAL | Status: DC
Start: 2017-12-25 — End: 2018-01-02
  Administered 2017-12-25 – 2018-01-02 (×16): via ORAL

## 2017-12-25 MED ORDER — IOPAMIDOL 76 % IV SOLN
370 mg iodine /mL (76 %) | Freq: Once | INTRAVENOUS | Status: AC
Start: 2017-12-25 — End: 2017-12-25

## 2017-12-25 MED ORDER — POTASSIUM CHLORIDE SR 20 MEQ TAB, PARTICLES/CRYSTALS
20 mEq | Freq: Two times a day (BID) | ORAL | Status: DC
Start: 2017-12-25 — End: 2018-01-02
  Administered 2017-12-25 – 2018-01-02 (×16): via ORAL

## 2017-12-25 MED ORDER — OXYCODONE 5 MG TAB
5 mg | ORAL | Status: DC | PRN
Start: 2017-12-25 — End: 2018-01-02
  Administered 2017-12-26: 19:00:00 via ORAL

## 2017-12-25 MED ORDER — BUDESONIDE SR 3 MG 24 HR CAP
3 mg | Freq: Three times a day (TID) | ORAL | Status: DC
Start: 2017-12-25 — End: 2018-01-02
  Administered 2017-12-25 – 2018-01-02 (×25): via ORAL

## 2017-12-25 MED ORDER — TACROLIMUS 1 MG CAP
1 mg | Freq: Two times a day (BID) | ORAL | Status: DC
Start: 2017-12-25 — End: 2018-01-02
  Administered 2017-12-25 – 2018-01-02 (×17): via ORAL

## 2017-12-25 MED ORDER — ZOLPIDEM 5 MG TAB
5 mg | Freq: Every evening | ORAL | Status: DC | PRN
Start: 2017-12-25 — End: 2018-01-02
  Administered 2017-12-26 – 2018-01-02 (×8): via ORAL

## 2017-12-25 MED ORDER — CHOLECALCIFEROL (VITAMIN D3) 1,000 UNIT (25 MCG) TAB
Freq: Every day | ORAL | Status: DC
Start: 2017-12-25 — End: 2018-01-02
  Administered 2017-12-25 – 2018-01-02 (×9): via ORAL

## 2017-12-25 MED ORDER — ACETAMINOPHEN 650 MG RECTAL SUPPOSITORY
650 mg | RECTAL | Status: DC | PRN
Start: 2017-12-25 — End: 2018-01-02

## 2017-12-25 MED ORDER — NILOTINIB 200 MG CAPSULE
200 mg | Freq: Two times a day (BID) | ORAL | Status: DC
Start: 2017-12-25 — End: 2018-01-02
  Administered 2017-12-25 – 2018-01-02 (×16): via ORAL

## 2017-12-25 MED ORDER — ACETAMINOPHEN (TYLENOL) SOLUTION 32MG/ML
ORAL | Status: DC | PRN
Start: 2017-12-25 — End: 2018-01-02

## 2017-12-25 MED ORDER — GLUCAGON 1 MG INJECTION
1 mg | INTRAMUSCULAR | Status: DC | PRN
Start: 2017-12-25 — End: 2018-01-02

## 2017-12-25 MED ORDER — DEXTROSE 50% IN WATER (D50W) IV
INTRAVENOUS | Status: DC | PRN
Start: 2017-12-25 — End: 2018-01-02

## 2017-12-25 MED ORDER — TRIMETHOPRIM-SULFAMETHOXAZOLE 160 MG-800 MG TAB
160-800 mg | ORAL | Status: DC
Start: 2017-12-25 — End: 2018-01-02
  Administered 2017-12-26 – 2018-01-02 (×4): via ORAL

## 2017-12-25 MED ORDER — SODIUM CHLORIDE 0.9 % IV PIGGY BACK
500 mg | Freq: Three times a day (TID) | INTRAVENOUS | Status: DC
Start: 2017-12-25 — End: 2017-12-26
  Administered 2017-12-26: 22:00:00 via INTRAVENOUS

## 2017-12-25 MED ORDER — NALOXONE 0.4 MG/ML INJECTION
0.4 mg/mL | INTRAMUSCULAR | Status: DC | PRN
Start: 2017-12-25 — End: 2018-01-02

## 2017-12-25 MED ORDER — PHARMACY VANCOMYCIN NOTE
Status: DC
Start: 2017-12-25 — End: 2018-01-02

## 2017-12-25 MED ORDER — PROCHLORPERAZINE MALEATE 5 MG TAB
5 mg | ORAL | Status: DC | PRN
Start: 2017-12-25 — End: 2018-01-02
  Administered 2017-12-25: 16:00:00 via ORAL

## 2017-12-25 MED ORDER — INSULIN GLARGINE 100 UNIT/ML INJECTION
100 unit/mL | Freq: Every evening | SUBCUTANEOUS | Status: DC
Start: 2017-12-25 — End: 2018-01-02
  Administered 2017-12-26 – 2018-01-02 (×8): via SUBCUTANEOUS

## 2017-12-25 MED FILL — VANCOMYCIN 10 GRAM IV SOLR: 10 gram | INTRAVENOUS | Qty: 1500

## 2017-12-25 MED FILL — PHARMACY VANCOMYCIN NOTE: Qty: 1

## 2017-12-25 MED FILL — BUDESONIDE SR 3 MG 24 HR CAP: 3 mg | ORAL | Qty: 2

## 2017-12-25 MED FILL — ACYCLOVIR 800 MG TAB: 800 mg | ORAL | Qty: 1

## 2017-12-25 MED FILL — POTASSIUM CHLORIDE SR 20 MEQ TAB, PARTICLES/CRYSTALS: 20 mEq | ORAL | Qty: 2

## 2017-12-25 MED FILL — TRIMETHOPRIM-SULFAMETHOXAZOLE 160 MG-800 MG TAB: 160-800 mg | ORAL | Qty: 1

## 2017-12-25 MED FILL — PROCHLORPERAZINE MALEATE 10 MG TAB: 10 mg | ORAL | Qty: 1

## 2017-12-25 MED FILL — ASPIRIN 81 MG CHEWABLE TAB: 81 mg | ORAL | Qty: 4

## 2017-12-25 MED FILL — TACROLIMUS 1 MG CAP: 1 mg | ORAL | Qty: 4

## 2017-12-25 MED FILL — VANCOMYCIN IN 0.9 % SODIUM CHLORIDE 1 GRAM/250 ML IV: 1 gram/250 mL | INTRAVENOUS | Qty: 250

## 2017-12-25 MED FILL — TASIGNA 200 MG CAPSULE: 200 mg | ORAL | Qty: 1

## 2017-12-25 MED FILL — ACYCLOVIR 800 MG TAB: 800 mg | ORAL | Qty: 0.5

## 2017-12-25 MED FILL — ISOVUE-370  76 % INTRAVENOUS SOLUTION: 370 mg iodine /mL (76 %) | INTRAVENOUS | Qty: 80

## 2017-12-25 MED FILL — CHOLECALCIFEROL (VITAMIN D3) 1,000 UNIT (25 MCG) TAB: ORAL | Qty: 5

## 2017-12-25 MED FILL — INSULIN LISPRO 100 UNIT/ML INJECTION: 100 unit/mL | SUBCUTANEOUS | Qty: 1

## 2017-12-25 NOTE — ED Notes (Signed)
[name] own supply of med is hand delivered by tech to pharmacy for verification.

## 2017-12-25 NOTE — ED Notes (Signed)
 TRANSFER - OUT REPORT:    Verbal report given to [name] on [name] Wu  being transferred to [facility](unit) for routine progression of care       Report consisted of patient's Situation, Background, Assessment and   Recommendations(SBAR).     Information from the following report(s) SBAR, Kardex, ED Summary, MAR, Recent Results and Cardiac Rhythm NSR was reviewed with the receiving nurse.    Lines:       Opportunity for questions and clarification was provided.      Patient transported with:   Patient-specific medications from Pharmacy  Tech

## 2017-12-25 NOTE — Consults (Signed)
patient seen, orders reviewed and new orders entered

## 2017-12-25 NOTE — Progress Notes (Signed)
Dr. Josie Saunders paged to let him know the blood culture report waiting for him to call back.

## 2017-12-25 NOTE — Progress Notes (Signed)
Pt has asked me to not wear mask in her room because she claims she is not neutropenic and is actually a bit angry about her isolation precautions. FYI.

## 2017-12-25 NOTE — ED Notes (Signed)
 TRANSFER - IN REPORT:    Verbal report received from Jaleesa(name) on [name] Wu  being received from ED OBS(unit) for routine progression of care      Report consisted of patient's Situation, Background, Assessment and   Recommendations(SBAR).     Information from the following report(s) SBAR, Kardex, ED Summary, MAR, Recent Results and Cardiac Rhythm NSR was reviewed with the receiving nurse.    Opportunity for questions and clarification was provided.      Assessment completed upon patient's care assumed.

## 2017-12-25 NOTE — ED Notes (Signed)
 TRANSFER - IN REPORT:    Verbal report received from Judy(name) on [name] Wu  being received from er (unit) for routine progression of care      Report consisted of patient's Situation, Background, Assessment and   Recommendations(SBAR).     Information from the following report(s) SBAR was reviewed with the receiving nurse.    Opportunity for questions and clarification was provided.      Assessment completed upon patient's arrival to unit and care assumed.

## 2017-12-25 NOTE — Progress Notes (Signed)
Bedside shift change report given to Dion RN (oncoming nurse) by Rebekah RN (offgoing nurse). Report included the following information SBAR, Kardex, MAR and Recent Results.

## 2017-12-25 NOTE — Progress Notes (Signed)
Progress Notes by Adele Schilder at 12/25/17 1847                Author: Adele Schilder  Service: --  Author Type: Registered Nurse       Filed: 12/25/17 1857  Date of Service: 12/25/17 1847  Status: Addendum          Editor: Adele Schilder (Registered Nurse)          Related Notes: Original Note by Adele Schilder (Registered Nurse) filed at 12/25/17  1847               1847  Page Sent             PAGER ID: [phone]   MESSAGE: [phone]. Laurina Bustle. FYI blood cultures + for Gram stain Gram Positive  Cocci In Clusters. Lupe Carney, RN [phone] Page Sent             PAGER ID: [phone]   MESSAGE: [phone]. Laurina Bustle. Dietary called to report patient has neurotronic  precautions for their dietary and can only have certain things. Is patient on neutropenic precautions? Lupe Carney, RN [phone]

## 2017-12-25 NOTE — Progress Notes (Signed)
 [facility]: Medication History    Medication History Completed Prior to Order Reconciliation?  YES  If no and discrepancies were noted please contact attending physician or pharmacist to follow-up.    Information obtained from (list all that apply, 2 sources preferred): Patient and Other: RX Query    If a history was not reviewed directly with patient/caregiver please comment with the reason why.     Antibiotic use in the last 90 days (3 months): YES      Missing Medication Identified  NO  Number of medications: -  Indicate action taken: Other -       Wrong Medication Identified NO   Number of medications: -  Indicate action taken: Other -       Wrong Dose/Interval/Route Identified NO              Number of medications: -   Indicate action taken: Other -         Warfarin   NO  Warfarin managed by: -  Most recent INR and date (if known): -  Warfarin tab strength: -  Warfarin dosing regimen: -  Last dose taken: -      Medication Compliance Issues and/or Medication Concerns: N/A      Allergies: Pcn [penicillins]    Prior to Admission Medications:    Prior to Admission Medications   Prescriptions Last Dose Informant Patient Reported? Taking?   0.9 % sodium chloride (SODIUM CHLORIDE 0.9 % IV)   Yes Yes   Sig: 1,000 mL by IntraVENous route See Admin Instructions. 1 L IV up to 2 times a week.  IVF to be given at 250 ml/hr over 4 hours if creatinine > or = 1.3 when drawn on Mondays and Thursdays   Magnesium  Oxide-Mg AA Chelate (MG-PLUS) 133 mg tab 12/24/2017 at Unknown time  Yes Yes   Sig: Take 1 Tab by mouth three (3) times daily.   acyclovir  (ZOVIRAX ) 400 mg tablet   Yes Yes   Sig: Take 400 mg by mouth two (2) times a day.   budesonide (ENTOCORT EC) 3 mg capsule 12/24/2017 at Unknown time  Yes Yes   Sig: Take 6 mg by mouth three (3) times daily.   cholecalciferol, VITAMIN D3, (VITAMIN D3) 5,000 unit tab tablet   Yes Yes   Sig: Take 5,000 Units by mouth daily.   insulin  degludec (TRESIBA  FLEXTOUCH U-200) 200  unit/mL (3 mL) inpn 12/23/2017 at Unknown time  Yes Yes   Sig: 30 Units by SubCUTAneous route daily.   insulin  lispro (HUMALOG  JUNIOR KWIKPEN U-100) 100 unit/mL inph 12/24/2017 at Unknown time  Yes Yes   Sig: by SubCUTAneous route three (3) times daily.   magnesium  sulfate in water (MAGNESIUM  SULFATE 2 G/50 ML) 2 gram/50 mL (4 %) IVPB   Yes Yes   Sig: 2-4 g by IntraVENous route as needed. 2 gm IV over 2 hours for magnesium  level 1.5-1.7  4gm IV over 4 hours for magnesium  level < 1.5  HOLD if magnesium  >= 1.8  Infusion based on results drawn on Mondays and Thursdays. May be given daily up to 2x/week   Indications: low amount of magnesium  in the blood   nilotinib (TASIGNA) 200 mg cap capsule 12/24/2017 at Unknown time  Yes Yes   Sig: Take 200 mg by mouth two (2) times a day.   oxyCODONE IR (ROXICODONE) 5 mg immediate release tablet 11/23/2017 at Unknown time  No Yes   Sig: Take 2 Tabs by mouth  every four (4) hours as needed. Max Daily Amount: 60 mg.   potassium chloride  SR (K-TAB ) 20 mEq tablet 12/24/2017 at Unknown time  Yes Yes   Sig: Take 40 mEq by mouth two (2) times a day.   prochlorperazine (COMPAZINE) 10 mg tablet 12/24/2017 at Unknown time  Yes Yes   Sig: Take 10 mg by mouth every four (4) hours as needed.   tacrolimus (PROGRAF) 0.5 mg capsule   Yes Yes   Sig: Take 4 mg by mouth two (2) times a day.   trimethoprim-sulfamethoxazole (BACTRIM DS) 160-800 mg per tablet   Yes Yes   Sig: Take 1 Tab by mouth See Admin Instructions. 1 tab orally Monday, Wednesday, Friday   zolpidem  (AMBIEN ) 5 mg tablet 11/23/2017 at Unknown time  Yes Yes   Sig: Take 5 mg by mouth nightly.      Facility-Administered Medications: None         Alexis N. Lang, CPHT   Contact: [phone]

## 2017-12-25 NOTE — Progress Notes (Signed)
Pt has meds that are missing from the [facility] - will pass on information to AM nurse.   Cymbalta 20mg   Mag OX 400mg   MagOx AA 133mg   Bactrim 160mg  MWF

## 2017-12-25 NOTE — Progress Notes (Signed)
Pt refused merrem and vanco tonight stating that she will not take any additional meds without authorization from her primary doctors caring for her leukemia. I spoke with Dr. Hart Carwin and he has asked to repeat her cultures. If labs still show gram positive cocci clusters he said we can proceed with antibiotics.     Lennie Muckle [phone] or [phone]  Abutalibe [phone]

## 2017-12-25 NOTE — Progress Notes (Signed)
Problem: Falls - Risk of  Goal: *Absence of Falls  Description  Document Schmid Fall Risk and appropriate interventions in the flowsheet.  Outcome: Progressing Towards Goal  Note:   Fall Risk Interventions:            Medication Interventions: Bed/chair exit alarm, Patient to call before getting OOB, Teach patient to arise slowly

## 2017-12-25 NOTE — ED Notes (Signed)
PIV inserted and locked.  MRI checklist completed.  Family brought in breakfast.  Pt denies any pain at this time.  Pt is awaiting on CT and MRI.  Call bell within reach and no distress noted.

## 2017-12-25 NOTE — H&P (Signed)
H&P by Einar Grad, MD at  12/25/17 [phone]                Author: Einar Grad, MD  Service: Hospitalist  Author Type: Physician       Filed: 12/25/17 2000  Date of Service: 12/25/17 0950  Status: Addendum          Editor: Einar Grad, MD (Physician)          Related Notes: Original Note by Einar Grad, MD (Physician) filed at 12/25/17 1011                          Admission History and Physical         Date of note:      December 25, 2017   Patient:               [name] Wu, 57 y.o., female   Admit Date:        12/24/2017        Chief Complaint:         Diplopia        History of Present Illness:         Patient is a 57 year old female with 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  She stated this started on Monday with initial blurry vision and left arm weakness with mild slurred speech.   At the same time, she noted blood sugar around 70.  She contacted her oncologist at the [facility] in [place] who thought this was hypoglycemic related and advised her to increase her glucose intake.  Her slurred speech and left  arm weakness improved however had continued blurred vision that transitioned to double vision. In addition, she has gait disturbances associated with her diplopia.  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  She follows up with an oncologist at [facility] in [place] and has seen Dr. Edd Arbour here at [facility].  No chest pain, shortness of breath, abdominal pain, vomiting, leg pain, or swelling.  No  rashes.        Assessment:        1.    Diplopia with transient slurred speech, left upper extremity weakness, gait disturbances.  Concern for TIA versus CVA   2.  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  Pancytopenia secondary to above   5.  Insulin-dependent type 2 diabetes   6.  Chronic pain          Plan:        ??    CT head negative however given ongoing diplopia, MRI brain ordered   ??  CTA head and neck pending.  Echocardiogram ordered.   ??  Neurology consulted.  Frequent neurochecks.   ??  Continue home immunosuppressive medications and antibiotic prophylaxis   ??  Oncology has been consulted since she has ALL and has complications from it.     ??  Given thrombocytopenia, defer subcu heparin at this time.  Continue SCDs      ADDENDUM: Patient's blood cultures grew GPC in clusters. I have started Vancomycin and Meropenem. Day hospitalist  to consult ID.       DVT Prophylaxis:  SCDs   Code status:  Full code   Disposition:  Placed in observation.   Estimated Discharge Date: 12/26/2017          Past Medical History:          Past Medical History:        Diagnosis  Date         ?  Diabetes ([facility])       ?  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  H/O stem cell transplant ([facility])  041/04/19     ?  Hx antineoplastic chemotherapy       ?  Hypertension           ?  Leukemia [facility])               Past Social History:        Patient denies any alcohol, tobacco, or illicit drug use. Patient is divorced and has no children.  She currently does not work.  She is a fan of the [employer] and does not like the [employer]        Family History:        Reviewed and noncontributory        Allergies:          Allergies        Allergen  Reactions         ?  Pcn [Penicillins]  Hives and Itching             Home Medications:          Prior to Admission medications             Medication  Sig  Start Date  End Date  Taking?  Authorizing Provider            prochlorperazine (COMPAZINE) 10 mg tablet  Take 10 mg by mouth every four (4) hours as needed.      Yes  Other, Phys, MD     budesonide (ENTOCORT EC) 3 mg capsule  Take 6 mg by  mouth three (3) times daily.      Yes  Other, Phys, MD     nilotinib (TASIGNA) 200 mg cap capsule  Take 200 mg by mouth two (2) times a day.      Yes  Other, Phys, MD     Magnesium Oxide-Mg AA Chelate (MG-PLUS) 133 mg tab  Take 1 Tab by mouth three (3) times daily.      Yes  Other, Phys, MD            magnesium sulfate in water (MAGNESIUM SULFATE 2 G/50 ML) 2 gram/50 mL (4 %) IVPB  2-4 g by IntraVENous route as needed. 2 gm IV over 2 hours for magnesium level 1.5-1.7   4gm IV over 4 hours for magnesium level < 1.5   HOLD if magnesium >= 1.8   Infusion based on results drawn on Mondays and Thursdays. May be given daily up to 2x/week   Indications: low amount of magnesium in the blood      Yes  Other, Phys, MD            cholecalciferol, VITAMIN D3, (VITAMIN D3) 5,000 unit tab tablet  Take 5,000 Units by mouth daily.      Yes  Other, Phys, MD     0.9 % sodium chloride (SODIUM CHLORIDE 0.9 %  IV)  1,000 mL by IntraVENous route See Admin Instructions. 1 L IV up to 2 times a week.   IVF to be given at 250 ml/hr over 4 hours if creatinine > or = 1.3 when drawn on Mondays and Thursdays      Yes  Other, Phys, MD     tacrolimus (PROGRAF) 0.5 mg capsule  Take 4 mg by mouth two (2) times a day.      Yes  Other, Phys, MD     trimethoprim-sulfamethoxazole (BACTRIM DS) 160-800 mg per tablet  Take 1 Tab by mouth See Admin Instructions. 1 tab orally Monday, Wednesday, Friday      Yes  Other, Phys, MD     oxyCODONE IR (ROXICODONE) 5 mg immediate release tablet  Take 2 Tabs by mouth every four (4) hours as needed. Max Daily Amount: 60 mg.  04/26/17    Yes  Tonny Bollman, MD     acyclovir (ZOVIRAX) 400 mg tablet  Take 400 mg by mouth two (2) times a day.      Yes  Other, Phys, MD     insulin lispro (HUMALOG JUNIOR KWIKPEN U-100) 100 unit/mL inph  by SubCUTAneous route three (3) times daily.      Yes  Other, Phys, MD     potassium chloride SR (K-TAB) 20 mEq tablet  Take 40 mEq by mouth two (2) times a day.      Yes  Other, Phys, MD      insulin degludec (TRESIBA FLEXTOUCH U-200) 200 unit/mL (3 mL) inpn  30 Units by SubCUTAneous route daily.      Yes  Other, Phys, MD            zolpidem (AMBIEN) 5 mg tablet  Take 5 mg by mouth nightly.      Yes  Other, Phys, MD             Review of Systems:        POSITIVES ARE HIGHLIGHTED IN RED      General: fevers, chills, sweats, fatigue, malaise, wt loss, wt gain, weakness   Skin: skin changes   HEENT: bleeding gums, cataracts, decreased hearing, dizziness, dry mouth, ear discharge, ear pain, epistaxis, eye discharge, eye itching,  eye pain, eye redness, headache, head injury, hoarseness, nasal congestion, oral ulcers, postnasal drip, rhinitis, ringing in the ears, sinus pain, throat itchiness, throat pain, throat soreness, toothache, vertigo,  vision changes   Neck: decreased ROM, neck mass, neck pain, neck stiffness, swollen glands   Respiratory: cough, dyspnea, sputum, wheezing, pleurisy   CV: chest pain, edema, orthopnea, palpitations, PND   GI: abd pain, anorexia, constipation, diarrhea, dysphagia, hemorrhoids, hematochezia, melena,  nausea, vomiting, reflux   GU: dysuria, frequency, hesitancy, incontinence, nocturia, polyuria, urgency   MS:  pain, stiffness, decreased ROM, leg cramps   Neuro: LOC, paralysis, paresthesias, seizures, syncope, stroke, tremor        Physical Examination:        Visit Vitals      BP  119/77     Pulse  89     Temp  98.8 ??F (37.1 ??C)     Resp  18     Ht  5' (1.524 m)     Wt  65.3 kg (144 lb)     SpO2  99%        BMI  28.12 kg/m??           General  No acute distress.  Alert and oriented x3  Head/face  Normocephalic.  Atraumatic     Neck  Supple.  No evidence of jugular venous distention     Eyes  No scleral icterus noted.  No conjunctival pallor observed     Cardiac  Regular rate and rhythm.  S1 and S2 heard.  No murmurs noted     Chest  Symmetrical chest rise.  No tenderness to palpation     Respiratory  Clear to auscultation bilaterally.  No wheezing or crackles noted      Abdomen  Soft.  Nontender and nondistended.  Bowel sounds present     Extremities  No gross lower or upper extremity edema noted.  No obvious erythema noted     Skin  No rashes or lesions noted on face, arms, or legs     Back  No focal back tenderness on palpation.  No gross bony deformities noted        Neurological  No focal neurological deficits noted. No cranial nerves grossly intact                 Labs:          Recent Results (from the past 24 hour(s))     METABOLIC PANEL, BASIC          Collection Time: 12/24/17 10:30 PM         Result  Value  Ref Range            Sodium  139  136 - 145 mEq/L       Potassium  4.4  3.5 - 5.1 mEq/L       Chloride  105  98 - 107 mEq/L       CO2  25  21 - 32 mEq/L       Glucose  239 (H)  74 - 106 mg/dl       BUN  10  7 - 25 mg/dl       Creatinine  0.8  0.6 - 1.3 mg/dl       GFR est AA  >60.0          GFR est non-AA  >60          Calcium  8.4 (L)  8.5 - 10.1 mg/dl       Anion gap  9  5 - 15 mmol/L       HEPATIC FUNCTION PANEL          Collection Time: 12/24/17 10:30 PM         Result  Value  Ref Range            AST (SGOT)  16  15 - 37 U/L       ALT (SGPT)  29  12 - 78 U/L       Alk. phosphatase  96  45 - 117 U/L       Bilirubin, total  0.4  0.2 - 1.0 mg/dl       Protein, total  5.5 (L)  6.4 - 8.2 gm/dl       Albumin  3.3 (L)  3.4 - 5.0 gm/dl       Bilirubin, direct  0.1  0.0 - 0.2 mg/dl       LIPASE          Collection Time: 12/24/17 10:30 PM         Result  Value  Ref Range            Lipase  41 (L)  73 - 393 U/L       MAGNESIUM          Collection Time: 12/24/17 10:30 PM         Result  Value  Ref Range            Magnesium  2.2  1.6 - 2.6 mg/dl       LACTIC ACID          Collection Time: 12/24/17 10:30 PM         Result  Value  Ref Range            Lactic Acid  1.7  0.4 - 2.0 mmol/L       TROPONIN I          Collection Time: 12/24/17 10:30 PM         Result  Value  Ref Range            Troponin-I  <0.015  0.000 - 0.045 ng/ml       TSH 3RD GENERATION          Collection Time:  12/24/17 10:30 PM         Result  Value  Ref Range            TSH  1.050  0.358 - 3.740 uIU/mL       CULTURE, BLOOD          Collection Time: 12/24/17 10:30 PM         Result  Value  Ref Range            Blood Culture Result  Culture In Progress, Daily Updates To Follow          CULTURE, BLOOD          Collection Time: 12/24/17 10:55 PM         Result  Value  Ref Range            Blood Culture Result  Culture In Progress, Daily Updates To Follow          CULTURE, URINE          Collection Time: 12/24/17 10:55 PM         Result  Value  Ref Range            Culture result  No Growth To Date          POC URINE MACROSCOPIC          Collection Time: 12/24/17 10:56 PM         Result  Value  Ref Range            Glucose  100 (A)  NEGATIVE,Negative mg/dl       Bilirubin  Negative  NEGATIVE,Negative         Ketone  Negative  NEGATIVE,Negative mg/dl       Specific gravity  1.015  1.005 - 1.030         Blood  Negative  NEGATIVE,Negative         pH (UA)  6.5  5 - 9         Protein  Negative  NEGATIVE,Negative mg/dl       Urobilinogen  0.2  0.0 - 1.0 EU/dl       Nitrites  Negative  NEGATIVE,Negative         Leukocyte Esterase  Trace (A)  NEGATIVE,Negative         Color  Yellow  Appearance  Clear          POC URINE MICROSCOPIC          Collection Time: 12/24/17 10:56 PM         Result  Value  Ref Range            Epithelial cells, squamous  5-9  /LPF       WBC  1-4  /HPF       Bacteria  OCCASIONAL  /HPF       Hyaline cast  OCCASIONAL  /LPF       POC CREATININE          Collection Time: 12/24/17 10:56 PM         Result  Value  Ref Range            Creatinine  0.7  0.6 - 1.3 mg/dl       EKG, 12 LEAD, INITIAL          Collection Time: 12/24/17 11:09 PM         Result  Value  Ref Range            Ventricular Rate  104  BPM       Atrial Rate  104  BPM       P-R Interval  148  ms       QRS Duration  66  ms       Q-T Interval  328  ms       QTC Calculation (Bezet)  431  ms       Calculated P Axis  18  degrees       Calculated R  Axis  17  degrees       Calculated T Axis  16  degrees       Diagnosis                 Sinus tachycardia   Otherwise normal ECG   No previous ECGs available   Confirmed by Marrion Coy, M.D., Ellin Saba. (30) on 12/25/2017 7:41:35 AM          CBC WITH AUTOMATED DIFF          Collection Time: 12/25/17 12:10 AM         Result  Value  Ref Range            WBC  2.9 (L)  4.0 - 11.0 1000/mm3       RBC  3.02 (L)  3.60 - 5.20 M/uL       HGB  10.4 (L)  13.0 - 17.2 gm/dl       HCT  29.5 (L)  37.0 - 50.0 %       MCV  97.7  80.0 - 98.0 fL       MCH  34.4  25.4 - 34.6 pg       MCHC  35.3  30.0 - 36.0 gm/dl       PLATELET  55 (L)  140 - 450 1000/mm3       MPV  10.5 (H)  6.0 - 10.0 fL       RDW-SD  54.7 (H)  36.4 - 46.3         NRBC  0  0 - 0         IMMATURE GRANULOCYTES  1.4  0.0 - 3.0 %       NEUTROPHILS  63.3  34 - 64 %       LYMPHOCYTES  25.2 (L)  28 - 48 %       MONOCYTES  9.1  1 - 13 %       EOSINOPHILS  1.0  0 - 5 %       BASOPHILS  0.0  0 - 3 %       PROTHROMBIN TIME + INR          Collection Time: 12/25/17  3:18 AM         Result  Value  Ref Range            Prothrombin time  12.1  10.2 - 12.9 seconds            INR  1.0  0.1 - 1.1               Radiology:        XR Results:     Results from Hospital Encounter encounter on 12/24/17     XR CHEST PA LAT           Narrative  Chest      Indication: hx of GVHD with N/V/D.       Comparison: 05/20/2017      Findings:      Frontal and lateral views of the chest demonstrate that the cardiac silhouette   is not enlarged.  There is no focal infiltrate, pleural effusion, vascular   congestion, or pneumothorax.  Left PICC with tip overlying the distal superior   vena cava.              Impression  Impression:       No acute cardiopulmonary disease.              CT Results:     Results from Hospital Encounter encounter on 12/24/17     CT HEAD WO CONT           Narrative  DICOM format image data is available to non-affiliated external healthcare   facilities or entities on a secure, media free,  reciprocally searchable basis   with patient authorization for 12 months following the date of the study.      Clinical history: Diplopia      EXAMINATION:   Noncontrast CT scan of the head 12/24/2017. 4 mm axial scanning is performed from   the skull base to the vertex. Coronal and sagittal reconstruction imaging has   been obtained.      Correlation: None      FINDINGS:   Visualized paranasal sinuses and mastoid air cells are clear. No acute   intracranial hemorrhage, mass or infarction.              Impression  IMPRESSION:   Normal unenhanced CT scan of the head.      Initial interpretation: [facility]              MRI Results:   No results found for this or any previous visit.      Nuclear Medicine Results:   No results found for this or any previous visit.      [place] Results:   No results found for this or any previous visit.      IR Results (maximum last 3):   No results found for this or any previous visit.      VAS/US Results (maximum last 3):     Results from [facility] encounter on 04/25/17     DUPLEX LOWER EXT VENOUS BILAT  Narrative                                                               Study ID: [id number]                                                     [facility]                                             [address] [place],                                          [place]                                       Lower Extremity Venous Report      Name: SERRIA, SLOMA Date: 04/25/2017 08:46 AM   MRN: [id number]                     Patient Location: CD06   DOB: [birth year]                  Age: 2 yrs   Gender: Female                   Account #:  [id number]   Reason For Study:  Elevated D-dimer   Ordering Physician: Meredeth Ide   Performed By: Wannetta Sender      Interpretation Summary   No evidence of acute superficial or deep vein thrombosis noted in either   lower extremity .      _____________________________________________________________________________   Verneda Skill   Complete bilateral venous duplex performed. ICD 10: R78.87.      HISTORY/SYMPTOMS   Cancer. Elevated d-dimer.      RIGHT LEG   The right common femoral, femoral, popliteal, posterior tibial and peroneal   veins were examined with duplex ultrasound. The deep veins were patent and   compressible with no evidence of intraluminal thrombus. Spontaneous, phasic   venous flow with normal augmentation was noted throughout the right leg.   RIGHT SAPHENOUS VEINS   Compression of the right proximal greater saphenous vein is complete.   Spectral Doppler exam demonstrates normal and spontaneous venous flow in the   right proximal greater saphenous vein.      LEFT LEG   The left common femoral, femoral, popliteal, posterior tibial and peroneal   veins were examined with duplex ultrasound. The deep veins were patent and   compressible with no evidence of intraluminal thrombus. Spontaneous, phasic   venous flow with normal augmentation was noted throughout the left leg.      LEFT LEG SAPHENOUS VEINS   Compression of the left proximal greater saphenous vein is complete. Spectral   Doppler exam demonstrates normal and spontaneous venous flow in the left   proximal greater saphenous vein.         Electronically signed byDr. Judithe Modest, M.D   04/25/2017 05:31 PM                 Total clinical care time is 70 minutes, including review of chart including all labs, radiology, past medical history, and discussion with patient and family. Greater than 50% of my time was spent  in [place] care and counseling      Portions of this electronic record were dictated using Systems analyst. Unintended errors  in translation may occur.            Einar Grad, MD   [facility]   December 25, 2017, 9:50 AM

## 2017-12-25 NOTE — ED Notes (Signed)
Dr. Posey Pronto is at the bedside with pt and family.

## 2017-12-25 NOTE — Progress Notes (Signed)
[facility] Dosing Services: Vancomycin    Consult for Vancomycin Dosing by Pharmacy by Dr. Linward Natal  Consult provided for this 58 y.o. year old female , for indication of bacteremia.  Day of Therapy new start    Ht Readings from Last 1 Encounters:   12/24/17 152.4 cm (60")        Wt Readings from Last 1 Encounters:   12/25/17 66.2 kg (145 lb 15.1 oz)        Previous Regimen    Last Level    Other Current Antibiotics merrem, bactrim for prophylaxis   Significant Cultures Gram positive cocci in clusters in blood cx   Serum Creatinine Lab Results   Component Value Date/Time    Creatinine 0.7 12/24/2017 10:56 PM      Creatinine Clearance Estimated Creatinine Clearance: 75.3 mL/min (based on SCr of 0.7 mg/dL).   BUN Lab Results   Component Value Date/Time    BUN 10 12/24/2017 10:30 PM      WBC Lab Results   Component Value Date/Time    WBC 2.9 (L) 12/25/2017 12:10 AM      H/H Lab Results   Component Value Date/Time    HGB 10.4 (L) 12/25/2017 12:10 AM      Platelets Lab Results   Component Value Date/Time    PLATELET 55 (L) 12/25/2017 12:10 AM      Temp 98.7 F (37.1 C)     Start Vancomycin therapy, with loading dose of 1500 mg at 2000 on 12/25/17.  Follow with maintenance dose of 1000 mg at 0800 on 12/26/17, every 12 hours.      Pharmacy to follow daily and will make changes to dose and/or frequency based on clinical status.    Additional recommendations regarding antibiotic therapy:  Vanco trough pending on 8/17 @ 0700.    Thank you for the consult,   Marguerita Merles, PharmD

## 2017-12-25 NOTE — ED Notes (Signed)
Pt arrived rm 16. Via wheelchair. NAd. Ambulatory to Br. Pt placed on monitor.

## 2017-12-25 NOTE — Progress Notes (Signed)
Paged Dr. Allena Katz about the blood culture results and ordered vancomycin and zosyn pharmacy to dose.

## 2017-12-25 NOTE — Progress Notes (Addendum)
[facility] Pharmacy Dosing Services: Vancomycin    Consult for Vancomycin Dosing by Pharmacy by Dr. Einar Grad  Consult provided for this 57 y.o. year old female , for indication of bacteremia.  Day of Therapy new start    Ht Readings from Last 1 Encounters:   12/24/17 152.4 cm (60")        Wt Readings from Last 1 Encounters:   12/25/17 66.2 kg (145 lb 15.1 oz)        Previous Regimen    Last Level    Other Current Antibiotics merrem, bactrim for prophylaxis   Significant Cultures Gram positive cocci in clusters in blood cx   Serum Creatinine Lab Results   Component Value Date/Time    Creatinine 0.7 12/24/2017 10:56 PM      Creatinine Clearance Estimated Creatinine Clearance: 75.3 mL/min (based on SCr of 0.7 mg/dL).   BUN Lab Results   Component Value Date/Time    BUN 10 12/24/2017 10:30 PM      WBC Lab Results   Component Value Date/Time    WBC 2.9 (L) 12/25/2017 12:10 AM      H/H Lab Results   Component Value Date/Time    HGB 10.4 (L) 12/25/2017 12:10 AM      Platelets Lab Results   Component Value Date/Time    PLATELET 55 (L) 12/25/2017 12:10 AM      Temp 98.7 ??F (37.1 ??C)     Start Vancomycin therapy, with loading dose of 1500 mg at 2000 on 12/25/17.  Follow with maintenance dose of 1000 mg at 0800 on 12/26/17, every 12 hours.      Pharmacy to follow daily and will make changes to dose and/or frequency based on clinical status.    Additional recommendations regarding antibiotic therapy:  Vanco trough pending on 8/17 @ 0700.    Thank you for the consult,   Al Corpus, PharmD

## 2017-12-25 NOTE — ED Notes (Signed)
TRANSFER - OUT REPORT:    Verbal report given to [name] Wu(name) on [name] Wu  being transferred to [address](unit) for routine progression of care       Report consisted of patient???s Situation, Background, Assessment and   Recommendations(SBAR).     Information from the following report(s) SBAR, Kardex, ED Summary, MAR, Recent Results and Cardiac Rhythm NSR was reviewed with the receiving nurse.    Lines:       Opportunity for questions and clarification was provided.      Patient transported with:   Patient-specific medications from Pharmacy  [facility]

## 2017-12-25 NOTE — Progress Notes (Signed)
Pt has meds that are missing from the [facility] - will pass on information to AM nurse.   Cymbalta 20mg   Mag OX 400mg   MagOx AA 133mg   Bactrim 160mg  MWF

## 2017-12-25 NOTE — H&P (Addendum)
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  She stated this started on Monday with initial blurry vision and left arm weakness with mild slurred speech.  At the same time, she noted blood sugar around 70.  She contacted her oncologist at the [facility] in [place] who thought this was hypoglycemic related and advised her to increase her glucose intake.  Her slurred speech and left arm weakness improved however had continued blurred vision that transitioned to double vision. In addition, she has gait disturbances associated with her diplopia.  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  She follows up with an oncologist at [facility] in [place] and has seen Dr. Edd Arbour here at [facility].  No chest pain, shortness of breath, abdominal pain, vomiting, leg pain, or swelling.  No rashes.    Assessment:     1. Diplopia with transient slurred speech, left upper extremity weakness, gait disturbances.  Concern for TIA versus CVA  2. 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 Pancytopenia secondary to above  5. Insulin-dependent type 2 diabetes  6. Chronic pain     Plan:     ?? CT head negative however given ongoing diplopia, MRI brain ordered  ?? CTA head and neck pending.  Echocardiogram ordered.  ?? Neurology consulted.  Frequent neurochecks.   ?? Continue home immunosuppressive medications and antibiotic prophylaxis  ?? Oncology has been consulted since she has ALL and has complications from it.    ?? Given thrombocytopenia, defer subcu heparin at this time.  Continue SCDs    ADDENDUM: Patient's blood cultures grew GPC in clusters. I have started Vancomycin and Meropenem. Day hospitalist to consult ID.     DVT Prophylaxis:  SCDs  Code status:  Full code  Disposition:  Placed in observation.  Estimated Discharge Date: 12/26/2017     Past Medical History:     Past Medical History:   Diagnosis Date   ??? Diabetes ([place])    ??? 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 H/O stem cell transplant ([place]) 041/04/19   ??? Hx antineoplastic chemotherapy    ??? Hypertension    ??? Leukemia ([place])        Past Social History:     Patient denies any alcohol, tobacco, or illicit drug use. Patient is divorced and has no children.  She currently does not work.  She is a fan of the [employer] and does not like the [employer]    Family History:     Reviewed and noncontributory    Allergies:     Allergies   Allergen Reactions   ??? Pcn [Penicillins] Hives and Itching       Home  Medications:     Prior to Admission medications    Medication Sig Start Date End Date Taking? Authorizing Provider   prochlorperazine (COMPAZINE) 10 mg tablet Take 10 mg by mouth every four (4) hours as needed.   Yes Other, Phys, MD   budesonide (ENTOCORT EC) 3 mg capsule Take 6 mg by mouth three (3) times daily.   Yes Other, Phys, MD   nilotinib (TASIGNA) 200 mg cap capsule Take 200 mg by mouth two (2) times a day.   Yes Other, Phys, MD   Magnesium Oxide-Mg AA Chelate (MG-PLUS) 133 mg tab Take 1 Tab by mouth three (3) times daily.   Yes Other, Phys, MD   magnesium sulfate in water (MAGNESIUM SULFATE 2 G/50 ML) 2 gram/50 mL (4 %) IVPB 2-4 g by IntraVENous route as needed. 2 gm IV over 2 hours for magnesium level 1.5-1.7  4gm IV over 4 hours for magnesium level < 1.5  HOLD if magnesium >= 1.8   Infusion based on results drawn on Mondays and Thursdays. May be given daily up to 2x/week   Indications: low amount of magnesium in the blood   Yes Other, Phys, MD   cholecalciferol, VITAMIN D3, (VITAMIN D3) 5,000 unit tab tablet Take 5,000 Units by mouth daily.   Yes Other, Phys, MD   0.9 % sodium chloride (SODIUM CHLORIDE 0.9 % IV) 1,000 mL by IntraVENous route See Admin Instructions. 1 L IV up to 2 times a week.  IVF to be given at 250 ml/hr over 4 hours if creatinine > or = 1.3 when drawn on Mondays and Thursdays   Yes Other, Phys, MD   tacrolimus (PROGRAF) 0.5 mg capsule Take 4 mg by mouth two (2) times a day.   Yes Other, Phys, MD   trimethoprim-sulfamethoxazole (BACTRIM DS) 160-800 mg per tablet Take 1 Tab by mouth See Admin Instructions. 1 tab orally Monday, Wednesday, Friday   Yes Other, Phys, MD   oxyCODONE IR (ROXICODONE) 5 mg immediate release tablet Take 2 Tabs by mouth every four (4) hours as needed. Max Daily Amount: 60 mg. 04/26/17  Yes Tonny Bollman, MD   acyclovir (ZOVIRAX) 400 mg tablet Take 400 mg by mouth two (2) times a day.   Yes Other, Phys, MD   insulin lispro (HUMALOG JUNIOR KWIKPEN U-100) 100 unit/mL inph by SubCUTAneous route three (3) times daily.   Yes Other, Phys, MD   potassium chloride SR (K-TAB) 20 mEq tablet Take 40 mEq by mouth two (2) times a day.   Yes Other, Phys, MD   insulin degludec (TRESIBA FLEXTOUCH U-200) 200 unit/mL (3 mL) inpn 30 Units by SubCUTAneous route daily.   Yes Other, Phys, MD   zolpidem (AMBIEN) 5 mg tablet Take 5 mg by mouth nightly.   Yes Other, Phys, MD       Review of Systems:     POSITIVES ARE HIGHLIGHTED IN RED    General: fevers, chills, sweats, fatigue, malaise, wt loss, wt gain, weakness  Skin: skin changes  HEENT: bleeding gums, cataracts, decreased hearing, dizziness, dry mouth, ear discharge, ear pain, epistaxis, eye discharge, eye itching, eye pain, eye redness, headache, head injury, hoarseness, nasal congestion, oral  ulcers, postnasal drip, rhinitis, ringing in the ears, sinus pain, throat itchiness, throat pain, throat soreness, toothache, vertigo, vision changes  Neck: decreased ROM, neck mass, neck pain, neck stiffness, swollen glands  Respiratory: cough, dyspnea, sputum, wheezing, pleurisy  CV: chest pain, edema, orthopnea, palpitations, PND  GI: abd pain, anorexia,  constipation, diarrhea, dysphagia, hemorrhoids, hematochezia, melena, nausea, vomiting, reflux  GU: dysuria, frequency, hesitancy, incontinence, nocturia, polyuria, urgency  MS:  pain, stiffness, decreased ROM, leg cramps  Neuro: LOC, paralysis, paresthesias, seizures, syncope, stroke, tremor    Physical Examination:     Visit Vitals  BP 119/77   Pulse 89   Temp 98.8 ??F (37.1 ??C)   Resp 18   Ht 5' (1.524 m)   Wt 65.3 kg (144 lb)   SpO2 99%   BMI 28.12 kg/m??     General No acute distress.  Alert and oriented x3   Head/face Normocephalic.  Atraumatic   Neck Supple.  No evidence of jugular venous distention   Eyes No scleral icterus noted.  No conjunctival pallor observed   Cardiac Regular rate and rhythm.  S1 and S2 heard.  No murmurs noted   Chest Symmetrical chest rise.  No tenderness to palpation   Respiratory Clear to auscultation bilaterally.  No wheezing or crackles noted   Abdomen Soft.  Nontender and nondistended.  Bowel sounds present   Extremities No gross lower or upper extremity edema noted.  No obvious erythema noted   Skin No rashes or lesions noted on face, arms, or legs   Back No focal back tenderness on palpation.  No gross bony deformities noted   Neurological No focal neurological deficits noted. No cranial nerves grossly intact          Labs:     Recent Results (from the past 24 hour(s))   METABOLIC PANEL, BASIC    Collection Time: 12/24/17 10:30 PM   Result Value Ref Range    Sodium 139 136 - 145 mEq/L    Potassium 4.4 3.5 - 5.1 mEq/L    Chloride 105 98 - 107 mEq/L    CO2 25 21 - 32 mEq/L    Glucose 239 (H) 74 - 106 mg/dl     BUN 10 7 - 25 mg/dl    Creatinine 0.8 0.6 - 1.3 mg/dl    GFR est AA >60.0      GFR est non-AA >60      Calcium 8.4 (L) 8.5 - 10.1 mg/dl    Anion gap 9 5 - 15 mmol/L   HEPATIC FUNCTION PANEL    Collection Time: 12/24/17 10:30 PM   Result Value Ref Range    AST (SGOT) 16 15 - 37 U/L    ALT (SGPT) 29 12 - 78 U/L    Alk. phosphatase 96 45 - 117 U/L    Bilirubin, total 0.4 0.2 - 1.0 mg/dl    Protein, total 5.5 (L) 6.4 - 8.2 gm/dl    Albumin 3.3 (L) 3.4 - 5.0 gm/dl    Bilirubin, direct 0.1 0.0 - 0.2 mg/dl   LIPASE    Collection Time: 12/24/17 10:30 PM   Result Value Ref Range    Lipase 41 (L) 73 - 393 U/L   MAGNESIUM    Collection Time: 12/24/17 10:30 PM   Result Value Ref Range    Magnesium 2.2 1.6 - 2.6 mg/dl   LACTIC ACID    Collection Time: 12/24/17 10:30 PM   Result Value Ref Range    Lactic Acid 1.7 0.4 - 2.0 mmol/L   TROPONIN I    Collection Time: 12/24/17 10:30 PM   Result Value Ref Range    Troponin-I <0.015 0.000 - 0.045 ng/ml   TSH 3RD GENERATION    Collection Time: 12/24/17 10:30 PM   Result Value Ref Range  TSH 1.050 0.358 - 3.740 uIU/mL   CULTURE, BLOOD    Collection Time: 12/24/17 10:30 PM   Result Value Ref Range    Blood Culture Result Culture In Progress, Daily Updates To Follow     CULTURE, BLOOD    Collection Time: 12/24/17 10:55 PM   Result Value Ref Range    Blood Culture Result Culture In Progress, Daily Updates To Follow     CULTURE, URINE    Collection Time: 12/24/17 10:55 PM   Result Value Ref Range    Culture result No Growth To Date     POC URINE MACROSCOPIC    Collection Time: 12/24/17 10:56 PM   Result Value Ref Range    Glucose 100 (A) NEGATIVE,Negative mg/dl    Bilirubin Negative NEGATIVE,Negative      Ketone Negative NEGATIVE,Negative mg/dl    Specific gravity 1.015 1.005 - 1.030      Blood Negative NEGATIVE,Negative      pH (UA) 6.5 5 - 9      Protein Negative NEGATIVE,Negative mg/dl    Urobilinogen 0.2 0.0 - 1.0 EU/dl    Nitrites Negative NEGATIVE,Negative       Leukocyte Esterase Trace (A) NEGATIVE,Negative      Color Yellow      Appearance Clear     POC URINE MICROSCOPIC    Collection Time: 12/24/17 10:56 PM   Result Value Ref Range    Epithelial cells, squamous 5-9 /LPF    WBC 1-4 /HPF    Bacteria OCCASIONAL /HPF    Hyaline cast OCCASIONAL /LPF   POC CREATININE    Collection Time: 12/24/17 10:56 PM   Result Value Ref Range    Creatinine 0.7 0.6 - 1.3 mg/dl   EKG, 12 LEAD, INITIAL    Collection Time: 12/24/17 11:09 PM   Result Value Ref Range    Ventricular Rate 104 BPM    Atrial Rate 104 BPM    P-R Interval 148 ms    QRS Duration 66 ms    Q-T Interval 328 ms    QTC Calculation (Bezet) 431 ms    Calculated P Axis 18 degrees    Calculated R Axis 17 degrees    Calculated T Axis 16 degrees    Diagnosis       Sinus tachycardia  Otherwise normal ECG  No previous ECGs available  Confirmed by Marrion Coy, M.D., Ellin Saba. ([id number]) on 12/25/2017 7:41:35 AM     CBC WITH AUTOMATED DIFF    Collection Time: 12/25/17 12:10 AM   Result Value Ref Range    WBC 2.9 (L) 4.0 - 11.0 1000/mm3    RBC 3.02 (L) 3.60 - 5.20 M/uL    HGB 10.4 (L) 13.0 - 17.2 gm/dl    HCT 29.5 (L) 37.0 - 50.0 %    MCV 97.7 80.0 - 98.0 fL    MCH 34.4 25.4 - 34.6 pg    MCHC 35.3 30.0 - 36.0 gm/dl    PLATELET 55 (L) 140 - 450 1000/mm3    MPV 10.5 (H) 6.0 - 10.0 fL    RDW-SD 54.7 (H) 36.4 - 46.3      NRBC 0 0 - 0      IMMATURE GRANULOCYTES 1.4 0.0 - 3.0 %    NEUTROPHILS 63.3 34 - 64 %    LYMPHOCYTES 25.2 (L) 28 - 48 %    MONOCYTES 9.1 1 - 13 %    EOSINOPHILS 1.0 0 - 5 %    BASOPHILS 0.0 0 - 3 %  PROTHROMBIN TIME + INR    Collection Time: 12/25/17  3:18 AM   Result Value Ref Range    Prothrombin time 12.1 10.2 - 12.9 seconds    INR 1.0 0.1 - 1.1         Radiology:     XR Results:  Results from [facility] encounter on 12/24/17   XR CHEST PA LAT    Narrative Chest    Indication: hx of GVHD with N/V/D.      Comparison: 05/20/2017    Findings:    Frontal and lateral views of the chest demonstrate that the cardiac silhouette   is not enlarged.  There is no focal infiltrate, pleural effusion, vascular  congestion, or pneumothorax.  Left PICC with tip overlying the distal superior  vena cava.      Impression Impression:     No acute cardiopulmonary disease.         CT Results:  Results from Hospital Encounter encounter on 12/24/17   CT HEAD WO CONT    Narrative DICOM format image data is available to non-affiliated external healthcare  facilities or entities on a secure, media free, reciprocally searchable basis  with patient authorization for 12 months following the date of the study.    Clinical history: Diplopia    EXAMINATION:  Noncontrast CT scan of the head 12/24/2017. 4 mm axial scanning is performed from  the skull base to the vertex. Coronal and sagittal reconstruction imaging has  been obtained.    Correlation: None    FINDINGS:  Visualized paranasal sinuses and mastoid air cells are clear. No acute  intracranial hemorrhage, mass or infarction.      Impression IMPRESSION:  Normal unenhanced CT scan of the head.    Initial interpretation: [facility]         MRI Results:  No results found for this or any previous visit.    Nuclear Medicine Results:  No results found for this or any previous visit.    [place] Results:  No results found for this or any previous visit.    IR Results (maximum last 3):  No results found for this or any previous visit.    VAS/US Results (maximum last 3):  Results from [facility] encounter on 04/25/17   DUPLEX LOWER EXT VENOUS BILAT    Narrative                                                              Study ID: [id number]                                                [facility]  [place],                                          [place]                                  Lower Extremity Venous Report    Name: JOSELYNN, AMOROSO            Study Date: 04/25/2017 08:46 AM  MRN: [id number]                     Patient Location: CD06  DOB: 01/25/1961                  Age: 51 yrs  Gender: Female                   Account #: [id number]  Reason For Study: Elevated D-dimer  Ordering Physician: Meredeth Ide  Performed By: Wannetta Sender    Interpretation Summary  No evidence of acute superficial or deep vein thrombosis noted in either  lower extremity .    _____________________________________________________________________________  Verneda Skill  Complete bilateral venous duplex performed. ICD 10: R78.87.    HISTORY/SYMPTOMS  Cancer. Elevated d-dimer.    RIGHT LEG  The right common femoral, femoral, popliteal, posterior tibial and peroneal  veins were examined with duplex ultrasound. The deep veins were patent and  compressible with no evidence of intraluminal thrombus. Spontaneous, phasic  venous flow with normal augmentation was noted throughout the right leg.  RIGHT SAPHENOUS VEINS  Compression of the right proximal greater saphenous vein is complete.  Spectral Doppler exam demonstrates normal and spontaneous venous flow in the  right proximal greater saphenous vein.    LEFT LEG  The left common femoral, femoral, popliteal, posterior tibial and peroneal  veins were examined with duplex ultrasound. The deep veins were patent and  compressible with no evidence of intraluminal thrombus. Spontaneous, phasic  venous flow with normal augmentation was noted throughout the left leg.    LEFT LEG SAPHENOUS VEINS  Compression of the left proximal greater saphenous vein is complete. Spectral  Doppler exam demonstrates normal and spontaneous venous flow in the left  proximal greater saphenous vein.       Electronically signed byDr. Judithe Modest, M.D   04/25/2017 05:31 PM  Total clinical care time is 70 minutes, including review of chart including all labs, radiology, past medical history, and discussion with patient and family. Greater than 50% of my time was spent in [place] care and counseling    Portions of this electronic record were dictated using Systems analyst. Unintended errors in translation may occur.        Einar Grad, MD  [facility]  December 25, 2017, 9:50 AM

## 2017-12-25 NOTE — ED Notes (Signed)
TRANSFER - IN REPORT:    Verbal report received from Judy(name) on [name] Wu  being received from er (unit) for routine progression of care      Report consisted of patient???s Situation, Background, Assessment and   Recommendations(SBAR).     Information from the following report(s) SBAR was reviewed with the receiving nurse.    Opportunity for questions and clarification was provided.      Assessment completed upon patient???s arrival to unit and care assumed.

## 2017-12-25 NOTE — ED Notes (Signed)
TRANSFER - IN REPORT:    Verbal report received from Jaleesa(name) on [name] Wu  being received from ED OBS(unit) for routine progression of care      Report consisted of patient???s Situation, Background, Assessment and   Recommendations(SBAR).     Information from the following report(s) SBAR, Kardex, ED Summary, MAR, Recent Results and Cardiac Rhythm NSR was reviewed with the receiving nurse.    Opportunity for questions and clarification was provided.      Assessment completed upon patient???s care assumed.

## 2017-12-25 NOTE — Other (Signed)
TRANSFER - OUT REPORT:    Verbal report given to [place], RN(name) on [name] Wu  being transferred to ED OBS(unit) for routine progression of care       Report consisted of patient???s Situation, Background, Assessment and   Recommendations(SBAR).     Information from the following report(s) SBAR was reviewed with the receiving nurse.    Lines:       Opportunity for questions and clarification was provided.      Patient transported with:   [employer]

## 2017-12-25 NOTE — Progress Notes (Addendum)
[id number]  Page Sent      PAGER ID: [phone]   MESSAGE: [phone]. Marlyn Corporal. FYI blood cultures + for Gram stain Gram Positive Cocci In Clusters. Eugene Garnet, RN [phone] Page Sent      PAGER ID: [phone]   MESSAGE: [phone]. Marlyn Corporal. Dietary called to report patient has neurotronic precautions for their dietary and can only have certain things. Is patient on neutropenic precautions? Eugene Garnet, [place]

## 2017-12-25 NOTE — Progress Notes (Signed)
[facility] Services: Medication History    Medication History Completed Prior to Order Reconciliation?  YES  If "no" and discrepancies were noted please contact attending physician or pharmacist to follow-up.    Information obtained from (list all that apply, 2 sources preferred): Patient and Other: RX Query    If a history was not reviewed directly with patient/caregiver please comment with the reason why.     Antibiotic use in the last 90 days (3 months): YES      Missing Medication Identified  NO  Number of medications: -  Indicate action taken: Other -       Wrong Medication Identified NO   Number of medications: -  Indicate action taken: Other -       Wrong Dose/Interval/Route Identified NO              Number of medications: -   Indicate action taken: Other -         Warfarin   NO  Warfarin managed by: -  Most recent INR and date (if known): -  Warfarin tab strength: -  Warfarin dosing regimen: -  Last dose taken: -      Medication Compliance Issues and/or Medication Concerns: N/A      Allergies: Pcn [penicillins]    Prior to Admission Medications:    Prior to Admission Medications   Prescriptions Last Dose Informant Patient Reported? Taking?   0.9 % sodium chloride (SODIUM CHLORIDE 0.9 % IV)   Yes Yes   Sig: 1,000 mL by IntraVENous route See Admin Instructions. 1 L IV up to 2 times a week.  IVF to be given at 250 ml/hr over 4 hours if creatinine > or = 1.3 when drawn on Mondays and Thursdays   Magnesium Oxide-Mg AA Chelate (MG-PLUS) 133 mg tab 12/24/2017 at Unknown time  Yes Yes   Sig: Take 1 Tab by mouth three (3) times daily.   acyclovir (ZOVIRAX) 400 mg tablet   Yes Yes   Sig: Take 400 mg by mouth two (2) times a day.   budesonide (ENTOCORT EC) 3 mg capsule 12/24/2017 at Unknown time  Yes Yes   Sig: Take 6 mg by mouth three (3) times daily.   cholecalciferol, VITAMIN D3, (VITAMIN D3) 5,000 unit tab tablet   Yes Yes   Sig: Take 5,000 Units by mouth daily.    insulin degludec (TRESIBA FLEXTOUCH U-200) 200 unit/mL (3 mL) inpn 12/23/2017 at Unknown time  Yes Yes   Sig: 30 Units by SubCUTAneous route daily.   insulin lispro (HUMALOG JUNIOR KWIKPEN U-100) 100 unit/mL inph 12/24/2017 at Unknown time  Yes Yes   Sig: by SubCUTAneous route three (3) times daily.   magnesium sulfate in water (MAGNESIUM SULFATE 2 G/50 ML) 2 gram/50 mL (4 %) IVPB   Yes Yes   Sig: 2-4 g by IntraVENous route as needed. 2 gm IV over 2 hours for magnesium level 1.5-1.7  4gm IV over 4 hours for magnesium level < 1.5  HOLD if magnesium >= 1.8  Infusion based on results drawn on Mondays and Thursdays. May be given daily up to 2x/week   Indications: low amount of magnesium in the blood   nilotinib (TASIGNA) 200 mg cap capsule 12/24/2017 at Unknown time  Yes Yes   Sig: Take 200 mg by mouth two (2) times a day.   oxyCODONE IR (ROXICODONE) 5 mg immediate release tablet 11/23/2017 at Unknown time  No Yes   Sig: Take 2 Tabs by mouth  every four (4) hours as needed. Max Daily Amount: 60 mg.   potassium chloride SR (K-TAB) 20 mEq tablet 12/24/2017 at Unknown time  Yes Yes   Sig: Take 40 mEq by mouth two (2) times a day.   prochlorperazine (COMPAZINE) 10 mg tablet 12/24/2017 at Unknown time  Yes Yes   Sig: Take 10 mg by mouth every four (4) hours as needed.   tacrolimus (PROGRAF) 0.5 mg capsule   Yes Yes   Sig: Take 4 mg by mouth two (2) times a day.   trimethoprim-sulfamethoxazole (BACTRIM DS) 160-800 mg per tablet   Yes Yes   Sig: Take 1 Tab by mouth See Admin Instructions. 1 tab orally Monday, Wednesday, Friday   zolpidem (AMBIEN) 5 mg tablet 11/23/2017 at Unknown time  Yes Yes   Sig: Take 5 mg by mouth nightly.      Facility-Administered Medications: None         Alexis N. Quentin Cornwall, CPHT   Contact: [phone]

## 2017-12-25 NOTE — Progress Notes (Signed)
Paged Dr. Posey Pronto about the blood culture results and ordered vancomycin and zosyn pharmacy to dose.

## 2017-12-25 NOTE — Progress Notes (Signed)
Pt refused merrem and vanco tonight stating that she will not take any additional meds without authorization from her primary doctors caring for her leukemia. I spoke with Dr. Hart Carwin and he has asked to repeat her cultures. If labs still show gram positive cocci clusters he said we can proceed with antibiotics.     Lennie Muckle [phone] or [phone]  Abutalibe [phone]

## 2017-12-26 ENCOUNTER — Observation Stay: Admit: 2017-12-26 | Payer: BLUE CROSS/BLUE SHIELD | Primary: Internal Medicine

## 2017-12-26 LAB — CBC W/O DIFF
HCT: 29 % — ABNORMAL LOW (ref 37.0–50.0)
HGB: 9.8 gm/dl — ABNORMAL LOW (ref 13.0–17.2)
MCH: 34.6 pg (ref 25.4–34.6)
MCHC: 33.8 gm/dl (ref 30.0–36.0)
MCV: 102.5 fL — ABNORMAL HIGH (ref 80.0–98.0)
MPV: 9.3 fL (ref 6.0–10.0)
PLATELET: 53 10*3/uL — ABNORMAL LOW (ref 140–450)
RBC: 2.83 M/uL — ABNORMAL LOW (ref 3.60–5.20)
RDW-SD: 56.6 — ABNORMAL HIGH (ref 36.4–46.3)
WBC: 3.6 10*3/uL — ABNORMAL LOW (ref 4.0–11.0)

## 2017-12-26 LAB — METABOLIC PANEL, BASIC
Anion gap: 6 mmol/L (ref 5–15)
BUN: 6 mg/dl — ABNORMAL LOW (ref 7–25)
CO2: 27 mEq/L (ref 21–32)
Calcium: 8.9 mg/dl (ref 8.5–10.1)
Chloride: 108 mEq/L — ABNORMAL HIGH (ref 98–107)
Creatinine: 0.7 mg/dl (ref 0.6–1.3)
GFR est AA: >60
GFR est non-AA: >60
Glucose: 207 mg/dl — ABNORMAL HIGH (ref 74–106)
Potassium: 4.7 mEq/L (ref 3.5–5.1)
Sodium: 140 mEq/L (ref 136–145)

## 2017-12-26 LAB — GLUCOSE, POC
Glucose (POC): 180 mg/dL — ABNORMAL HIGH (ref 65–105)
Glucose (POC): 198 mg/dL — ABNORMAL HIGH (ref 65–105)
Glucose (POC): 214 mg/dL — ABNORMAL HIGH (ref 65–105)
Glucose (POC): 282 mg/dL — ABNORMAL HIGH (ref 65–105)

## 2017-12-26 LAB — LIPID PANEL
CHOL/HDL Ratio: 3.1 Ratio (ref 0.0–4.4)
Chol/HDL Ratio: 3.1 Ratio (ref 0.0–4.4)
Cholesterol, Total: 174 mg/dl (ref 140–199)
Cholesterol, total: 174 mg/dl (ref 140–199)
HDL Cholesterol: 57 mg/dl (ref 40–96)
HDL: 57 mg/dl (ref 40–96)
LDL Calculated: 88 mg/dl (ref 0–130)
LDL, calculated: 88 mg/dl (ref 0–130)
Triglyceride: 145 mg/dl (ref 29–150)
Triglycerides: 145 mg/dl (ref 29–150)

## 2017-12-26 LAB — CULTURE, URINE
CULTURE RESULT: 30000 — AB
Culture result: 30000 — AB

## 2017-12-26 LAB — CULTURE, BLOOD FOR MRSA/SA BY PCR
Blood culture S. aureus by PCR: NEGATIVE
Blood culture, MRSA by PCR: NEGATIVE
Blood culture, MRSA by PCR: NEGATIVE
Blood culture, S. aureus by PCR: NEGATIVE

## 2017-12-26 LAB — HEMOGLOBIN A1C W/O EAG
Hemoglobin A1C: 7 % — ABNORMAL HIGH (ref 4.2–6.3)
Hemoglobin A1c: 7 % — ABNORMAL HIGH (ref 4.2–6.3)

## 2017-12-26 LAB — MAGNESIUM
Magnesium: 1.4 mg/dl — ABNORMAL LOW (ref 1.6–2.6)
Magnesium: 1.4 mg/dl — ABNORMAL LOW (ref 1.6–2.6)

## 2017-12-26 LAB — BASIC METABOLIC PANEL
Anion Gap: 6 mmol/L (ref 5–15)
BUN: 6 mg/dl — ABNORMAL LOW (ref 7–25)
CO2: 27 mEq/L (ref 21–32)
Calcium: 8.9 mg/dl (ref 8.5–10.1)
Chloride: 108 mEq/L — ABNORMAL HIGH (ref 98–107)
Creatinine: 0.7 mg/dl (ref 0.6–1.3)
GFR African American: >60
Glucose: 207 mg/dl — ABNORMAL HIGH (ref 74–106)
Potassium: 4.7 mEq/L (ref 3.5–5.1)
Sodium: 140 mEq/L (ref 136–145)
eGFR NON-AA: >60

## 2017-12-26 LAB — CBC
Hematocrit: 29 % — ABNORMAL LOW (ref 37.0–50.0)
Hemoglobin: 9.8 gm/dl — ABNORMAL LOW (ref 13.0–17.2)
MCH: 34.6 pg (ref 25.4–34.6)
MCHC: 33.8 gm/dl (ref 30.0–36.0)
MCV: 102.5 fL — ABNORMAL HIGH (ref 80.0–98.0)
MPV: 9.3 fL (ref 6.0–10.0)
Platelets: 53 10*3/uL — ABNORMAL LOW (ref 140–450)
RBC: 2.83 M/uL — ABNORMAL LOW (ref 3.60–5.20)
RDW-SD: 56.6 — ABNORMAL HIGH (ref 36.4–46.3)
WBC: 3.6 10*3/uL — ABNORMAL LOW (ref 4.0–11.0)

## 2017-12-26 LAB — POCT GLUCOSE
POC Glucose: 180 mg/dL — ABNORMAL HIGH (ref 65–105)
POC Glucose: 198 mg/dL — ABNORMAL HIGH (ref 65–105)
POC Glucose: 214 mg/dL — ABNORMAL HIGH (ref 65–105)
POC Glucose: 282 mg/dL — ABNORMAL HIGH (ref 65–105)

## 2017-12-26 MED ORDER — MAGNESIUM OXIDE 400 MG TAB
400 mg | Freq: Two times a day (BID) | ORAL | Status: DC
Start: 2017-12-26 — End: 2018-01-02
  Administered 2017-12-26 – 2018-01-02 (×15): via ORAL

## 2017-12-26 MED ORDER — DULOXETINE 20 MG CAP, DELAYED RELEASE
20 mg | Freq: Every day | ORAL | Status: DC
Start: 2017-12-26 — End: 2018-01-02
  Administered 2017-12-27 – 2017-12-31 (×4): via ORAL

## 2017-12-26 MED ORDER — PANTOPRAZOLE 40 MG TAB, DELAYED RELEASE
40 mg | Freq: Every day | ORAL | Status: DC
Start: 2017-12-26 — End: 2018-01-02
  Administered 2017-12-26 – 2018-01-02 (×8): via ORAL

## 2017-12-26 MED ORDER — SODIUM CHLORIDE 0.9 % IJ SYRG
Freq: Every day | INTRAMUSCULAR | Status: DC
Start: 2017-12-26 — End: 2017-12-31
  Administered 2017-12-26 – 2017-12-31 (×4)

## 2017-12-26 MED ORDER — MEROPENEM 500 MG IV SOLR
500 mg | Freq: Three times a day (TID) | INTRAVENOUS | Status: DC
Start: 2017-12-26 — End: 2017-12-27
  Administered 2017-12-27 (×2): via INTRAVENOUS

## 2017-12-26 MED ORDER — LORAZEPAM 2 MG/ML IJ SOLN
2 mg/mL | Freq: Once | INTRAMUSCULAR | Status: AC
Start: 2017-12-26 — End: 2017-12-26
  Administered 2017-12-26: 13:00:00 via INTRAVENOUS

## 2017-12-26 MED ORDER — SODIUM CHLORIDE 0.9 % IJ SYRG
INTRAMUSCULAR | Status: DC | PRN
Start: 2017-12-26 — End: 2018-01-02
  Administered 2018-01-01: 18:00:00

## 2017-12-26 MED ORDER — HEPARIN, PORCINE (PF) 10 UNIT/ML IV SYRINGE
10 unit/mL | INTRAVENOUS | Status: DC | PRN
Start: 2017-12-26 — End: 2018-01-02

## 2017-12-26 MED ORDER — HEPARIN, PORCINE (PF) 10 UNIT/ML IV SYRINGE
10 unit/mL | Freq: Every day | INTRAVENOUS | Status: DC
Start: 2017-12-26 — End: 2017-12-31
  Administered 2017-12-26 – 2017-12-31 (×3)

## 2017-12-26 MED ORDER — IOPAMIDOL 76 % IV SOLN
370 mg iodine /mL (76 %) | Freq: Once | INTRAVENOUS | Status: AC
Start: 2017-12-26 — End: 2017-12-26
  Administered 2017-12-26: 14:00:00 via INTRAVENOUS

## 2017-12-26 MED ORDER — .PHARMACY TO SUBSTITUTE PER PROTOCOL
Status: DC | PRN
Start: 2017-12-26 — End: 2017-12-26

## 2017-12-26 MED ORDER — IOPAMIDOL 76 % IV SOLN
370 mg iodine /mL (76 %) | Freq: Once | INTRAVENOUS | Status: AC
Start: 2017-12-26 — End: 2017-12-26
  Administered 2017-12-26: 15:00:00 via INTRAVENOUS

## 2017-12-26 MED FILL — BD POSIFLUSH NORMAL SALINE 0.9 % INJECTION SYRINGE: INTRAMUSCULAR | Qty: 10

## 2017-12-26 MED FILL — MAGNESIUM OXIDE 400 MG TAB: 400 mg | ORAL | Qty: 1

## 2017-12-26 MED FILL — VANCOMYCIN IN 0.9 % SODIUM CHLORIDE 1 GRAM/250 ML IV: 1 gram/250 mL | INTRAVENOUS | Qty: 250

## 2017-12-26 MED FILL — BUDESONIDE SR 3 MG 24 HR CAP: 3 mg | ORAL | Qty: 2

## 2017-12-26 MED FILL — HEPARIN, PORCINE (PF) 10 UNIT/ML IV SYRINGE: 10 unit/mL | INTRAVENOUS | Qty: 5

## 2017-12-26 MED FILL — MEROPENEM 500 MG IV SOLR: 500 mg | INTRAVENOUS | Qty: 500

## 2017-12-26 MED FILL — POTASSIUM CHLORIDE SR 20 MEQ TAB, PARTICLES/CRYSTALS: 20 mEq | ORAL | Qty: 2

## 2017-12-26 MED FILL — ISOVUE-370  76 % INTRAVENOUS SOLUTION: 370 mg iodine /mL (76 %) | INTRAVENOUS | Qty: 80

## 2017-12-26 MED FILL — PANTOPRAZOLE 40 MG TAB, DELAYED RELEASE: 40 mg | ORAL | Qty: 1

## 2017-12-26 MED FILL — ACYCLOVIR 800 MG TAB: 800 mg | ORAL | Qty: 1

## 2017-12-26 MED FILL — LORAZEPAM 2 MG/ML IJ SOLN: 2 mg/mL | INTRAMUSCULAR | Qty: 1

## 2017-12-26 MED FILL — TACROLIMUS 1 MG CAP: 1 mg | ORAL | Qty: 4

## 2017-12-26 MED FILL — TRIMETHOPRIM-SULFAMETHOXAZOLE 160 MG-800 MG TAB: 160-800 mg | ORAL | Qty: 1

## 2017-12-26 MED FILL — ISOVUE-370  76 % INTRAVENOUS SOLUTION: 370 mg iodine /mL (76 %) | INTRAVENOUS | Qty: 70

## 2017-12-26 MED FILL — CHOLECALCIFEROL (VITAMIN D3) 1,000 UNIT (25 MCG) TAB: ORAL | Qty: 5

## 2017-12-26 MED FILL — OXYCODONE 5 MG TAB: 5 mg | ORAL | Qty: 2

## 2017-12-26 MED FILL — ZOLPIDEM 5 MG TAB: 5 mg | ORAL | Qty: 1

## 2017-12-26 NOTE — Progress Notes (Signed)
Problem: Falls - Risk of  Goal: *Absence of Falls  Description  Document Schmid Fall Risk and appropriate interventions in the flowsheet.  Outcome: Progressing Towards Goal  Note: Fall Risk Interventions:            Medication Interventions: Patient to call before getting OOB

## 2017-12-26 NOTE — Progress Notes (Signed)
Stroke rounds:    VTE Prophylaxis: Yes: SCD's    Antiplatelet: No    Statin if LDL Greater Than or Equal [id number]: No    BP Parameters: Notify MD for systolic blood pressure greater than 180 mmHg or less than 90 mmHg    Controlled With: None    Dysphagia Screen Completed: Yes: Pass  Dysphagia Screening  Vocal Quality/Secretions: Normal  History of Dysphagia: (!) Yes(states she has difficulty swallowing at times)  O2 Saturation: Normal  Alertness: Normal  Pre-Swallow Assessment Score: 1  Purees: No difficulty noted  Water by Cup: No difficulty noted  Water by Straw: No difficulty noted    Patient has PEG, NG Tube, Feeding Tube: No    Medication orders per above route: Yes    Nutrition Status: PO    NIH Stroke Scale Complete: Yes: 0    Frequency of Vital Signs: Every 4 hours     Frequency of Neuro Checks: Every 4 hours    Daily Education/Care Plan Updated: Yes    Rinaldo Cloud, RN

## 2017-12-26 NOTE — Progress Notes (Signed)
Neurology Progress Note    Patient ID:  [name] [name]  [id number]  57 y.o.  1960/12/21    Subjective:   HISTORY:    57 years old female admitted to the hospital with complaint of 3 to 4 days history of blurred vision double vision, left face and left arm numbness and unsteady gait. The symptoms had improved immediately after taking glucose except had continued having double vision.     INTERVAL CHANGE/ROS:  Since admission she feels better she did not have any further episode of facial left arm numbness or unsteady gait. She continues to have double vision change when she looks to either side up or down.  She also complains of mild dull aching pressure type headache over her forehead but denies vomiting or dizziness. She denies any symptoms of speech disturbances confusion numbness or weakness in the arms or legs or significant gait or sphincter disturbances. She denies droopiness of eyelids, difficulty chewing, swallowing or breathing.    Physical examination:  HEENT normocephalic atraumatic  General appearance well-built well-nourished  Neck supple  Cardiovascular rate and rhythm regular  Dermatology: no skin rashes seen   Musculoskeletal no joint deformities seen  Psychiatric mood and affect appropriate    Neurological examination:  She is awake alert and oriented x3 her speech is clear comprehension is intact cranial nerves II through XII are intact field of vision is intact bilaterally pupils both equal and reacting to light extraocular movements are full no ptosis or nystagmus is seen no facial weakness is the remainder of cranial nerves are intact she has normal muscle tone and normal muscle strength in all 4 extremities no arm drift is seen no sensory deficit is elicited deep tendon reflexes 1+ bilateral symmetrical both plantar reflexes are flexors gait not tested    Data review:  MRI brain without contrast on 12/26/2017 has revealed nonspecific white matter hypo  Intensities from chronic microvascular ischemic  changes other differentials include graft versus host, inflammatory versus infectious, drug toxicity demyelinating process trauma chronic headaches or vasculitis.  I personally reviewed MRI films and agree with the interpretation  CTA of neck and head has not revealed any significant large vessel stenosis on either side  Sed rate 45 mm/h; BMP normal; lipid panel triglycerides 145 cholesterol 174 HDL 57 LDL 88 CPK 24 hemoglobin A1c 7.0 RA test negative ANA pending myasthenia gravis panel pending musk antibodies pending    Impression:  3 to 4 days history of blurred vision double vision left face and left arm numbness and unsteady gait of unclear etiology objective neurological examination remains normal at present MRI brain has not revealed any acute brainstem ischemic infarct or mass lesion with given history of leukemia and bone marrow transplantation possibility of meningeal carcinomatosis needs to be ruled out  Possible etiology for a double vision will include ocular myasthenia gravis or typical inflammatory radicular neuritis likely Idamae Schuller syndrome not be excluded  Muscle contraction headaches    Plan:  Discussed the findings of lab work, MRI of brain and CTA of neck and head with her  Check pending lab reports  She is seen by Dr. Kellie Moor from infectious disease for positive blood cultures in immunocompromised patient.  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 Edd Arbour oncologist  Dr. Harriette Ohara will take over neurology services from this afternoon and will continue to follow her      Objective:     Current Facility-Administered Medications   Medication Dose Route Frequency   ???  sodium chloride (NS) flush 10 mL  10 mL InterCATHeter DAILY   ??? sodium chloride (NS) flush 10-20 mL  10-20 mL InterCATHeter PRN   ??? heparin (porcine) pf 50 Units  50 Units InterCATHeter PRN   ??? heparin (porcine) pf 50 Units  50 Units InterCATHeter DAILY   ??? iopamidol (ISOVUE-370) 76 % injection 80 mL  80 mL IntraVENous RAD ONCE   ??? DULoxetine (CYMBALTA) capsule 20 mg  20 mg Oral DAILY   ??? magnesium oxide (MAG-OX) tablet 400 mg  400 mg Oral BID   ??? pantoprazole (PROTONIX) tablet 40 mg  40 mg Oral DAILY   ??? acyclovir (ZOVIRAX) tablet 400 mg  400 mg Oral BID   ??? cholecalciferol (VITAMIN D3) tablet 5,000 Units  5,000 Units Oral DAILY   ??? dextrose (D50) infusion 5-25 g  10-50 mL IntraVENous PRN   ??? glucagon (GLUCAGEN) injection 1 mg  1 mg IntraMUSCular PRN   ??? insulin glargine (LANTUS) injection 1-100 Units  1-100 Units SubCUTAneous QHS   ??? insulin lispro (HUMALOG) injection 1-100 Units  1-100 Units SubCUTAneous AC&HS   ??? insulin lispro (HUMALOG) injection 1-100 Units  1-100 Units SubCUTAneous PRN   ??? oxyCODONE IR (ROXICODONE) tablet 10 mg  10 mg Oral Q4H PRN   ??? potassium chloride (K-DUR, KLOR-CON) SR tablet 40 mEq  40 mEq Oral BID   ??? tacrolimus (PROGRAF) capsule 4 mg  4 mg Oral BID   ??? prochlorperazine (COMPAZINE) tablet 10 mg  10 mg Oral Q4H PRN   ??? zolpidem (AMBIEN) tablet 5 mg  5 mg Oral QHS PRN   ??? trimethoprim-sulfamethoxazole (BACTRIM DS, SEPTRA DS) 160-800 mg per tablet 1 Tab  1 Tab Oral Q MON, WED & FRI   ??? naloxone (NARCAN) injection 0.1 mg  0.1 mg IntraVENous PRN   ??? acetaminophen (TYLENOL) tablet 650 mg  650 mg Oral Q4H PRN    Or   ??? acetaminophen (TYLENOL) solution 650 mg  650 mg Oral Q4H PRN    Or   ??? acetaminophen (TYLENOL) suppository 650 mg  650 mg Rectal Q4H PRN   ??? nilotinib (TASIGNA) capsule cap 200 mg  (Patient Supplied)  200 mg Oral BID   ??? LORazepam (ATIVAN) tablet 1 mg  1 mg Oral ON CALL   ??? budesonide (ENTOCORT EC) capsule 6 mg  6 mg Oral TID   ??? vancomycin (VANCOCIN) 1000 mg in NS 250 ml  infusion  1,000 mg IntraVENous Q12H   ??? *Pharmacy dosing vancomycin  1 Each Other Rx Dosing/Monitoring   ??? meropenem (MERREM) 500 mg in 0.9% sodium chloride (MBP/ADV) 50 mL MBP  0.5 g IntraVENous Q8H        Patient Vitals for the past 8 hrs:   BP Temp Pulse Resp SpO2   12/26/17 1600 132/79 98.6 ??F (37 ??C) 94 13 100 %   12/26/17 1109 138/76 97.7 ??F (36.5 ??C) 94 14 100 %       Visit Vitals  BP 132/79 (BP 1 Location: Right arm, BP Patient Position: Supine)   Pulse 94   Temp 98.6 ??F (37 ??C)   Resp 13   Ht 5' (1.524 m)   Wt 66.1 kg (145 lb 11.6 oz)  SpO2 100%   BMI 22.16 kg/m??       Lab Review   Recent Results (from the past 24 hour(s))   SED RATE (ESR)    Collection Time: 12/25/17  5:25 PM   Result Value Ref Range    Sed rate (ESR) 45 (H) 0 - 30 mm/Hr   RHEUMATOID FACTOR, QL    Collection Time: 12/25/17  5:25 PM   Result Value Ref Range    Rheumatoid factor <10 0 - 14 IU/ml   CK    Collection Time: 12/25/17  5:25 PM   Result Value Ref Range    CK 24 (L) 26 - 192 U/L   GLUCOSE, POC    Collection Time: 12/25/17  7:44 PM   Result Value Ref Range    Glucose (POC) 213 (H) 65 - 105 mg/dL   CBC W/O DIFF    Collection Time: 12/26/17  4:40 AM   Result Value Ref Range    WBC 3.6 (L) 4.0 - 11.0 1000/mm3    RBC 2.83 (L) 3.60 - 5.20 M/uL    HGB 9.8 (L) 13.0 - 17.2 gm/dl    HCT 29.0 (L) 37.0 - 50.0 %    MCV 102.5 (H) 80.0 - 98.0 fL    MCH 34.6 25.4 - 34.6 pg    MCHC 33.8 30.0 - 36.0 gm/dl    PLATELET 53 (L) 140 - 450 1000/mm3    MPV 9.3 6.0 - 10.0 fL    RDW-SD 56.6 (H) 36.4 - 37.6     METABOLIC PANEL, BASIC    Collection Time: 12/26/17  4:40 AM   Result Value Ref Range    Sodium 140 136 - 145 mEq/L    Potassium 4.7 3.5 - 5.1 mEq/L    Chloride 108 (H) 98 - 107 mEq/L    CO2 27 21 - 32 mEq/L    Glucose 207 (H) 74 - 106 mg/dl    BUN 6 (L) 7 - 25 mg/dl    Creatinine 0.7 0.6 - 1.3 mg/dl    GFR est AA >60.0      GFR est non-AA >60      Calcium 8.9 8.5 - 10.1 mg/dl    Anion gap 6 5 - 15 mmol/L   MAGNESIUM    Collection Time: 12/26/17  4:40  AM   Result Value Ref Range    Magnesium 1.4 (L) 1.6 - 2.6 mg/dl   LIPID PANEL    Collection Time: 12/26/17  4:40 AM   Result Value Ref Range    Cholesterol, total 174 140 - 199 mg/dl    HDL Cholesterol 57 40 - 96 mg/dl    Triglyceride 145 29 - 150 mg/dl    LDL, calculated 88 0 - 130 mg/dl    CHOL/HDL Ratio 3.1 0.0 - 4.4 Ratio   HEMOGLOBIN A1C W/O EAG    Collection Time: 12/26/17  4:40 AM   Result Value Ref Range    Hemoglobin A1c 7.0 (H) 4.2 - 6.3 %   GLUCOSE, POC    Collection Time: 12/26/17 11:10 AM   Result Value Ref Range    Glucose (POC) 180 (H) 65 - 105 mg/dL   GLUCOSE, POC    Collection Time: 12/26/17  2:43 PM   Result Value Ref Range    Glucose (POC) 198 (H) 65 - 105 mg/dL     Results from Hospital Encounter encounter on 12/24/17   MRI BRAIN WO CONT    Narrative EXAMINATION: MRI BRAIN WO CONT  CLINICAL INDICATION: Diplopia; TIA bone marrow transplant. Blurred vision     COMPARISON:  None.    TECHNIQUE: T1 and T2-weighted sequences of the brain were obtained in multiple  planes without contrast.    FINDINGS:   No restricted diffusion.  No masses, no midline shift. No hydrocephalus. No extra-axial fluid collections.  Visualized portions of the orbits grossly unremarkable.  Mastoid air cells well aerated. Visualized portions of paranasal sinuses  unremarkable.    Intracranial flow voids of great vessels, grossly intact. Diffuse low signal  marrow.  Age-appropriate atrophy. No susceptibility artifact on gradient echo imaging to  suggest hemorrhage.  Cerebellar tonsil extends into the foramen magnum 2 mm or so. Pituitary gland  normal. Orbits appear normal. No compression of optic chiasm extraocular muscles  are normal in size. No orbital masses. Several foci of T2 prolongation in corona  radiata and centrum semiovale. Largest in the right frontal lobe measures 8 mm  and in the left parietal lobe millimeters 10 mm. Several additional smaller  lesions are noted in the frontal and parietal lobes.        Impression IMPRESSION:  1.   Nonspecific white matter hyperintensities likely due to chronic  microvascular ischemic changes. Iatrogenic drug toxicity, graft-versus-host,  inflammatory/infectious, hypertension, demyelinating process, trauma, chronic  headaches, sequelae of vasculitis are other considerations.  2. Low-lying cerebellar tonsils.  3. No orbital masses. No infarction.         Mri Brain Wo Cont    Result Date: 12/26/2017  EXAMINATION: MRI BRAIN WO CONT CLINICAL INDICATION: Diplopia; TIA bone marrow transplant. Blurred vision COMPARISON:  None. TECHNIQUE: T1 and T2-weighted sequences of the brain were obtained in multiple planes without contrast. FINDINGS: No restricted diffusion. No masses, no midline shift. No hydrocephalus. No extra-axial fluid collections. Visualized portions of the orbits grossly unremarkable. Mastoid air cells well aerated. Visualized portions of paranasal sinuses unremarkable. Intracranial flow voids of great vessels, grossly intact. Diffuse low signal marrow. Age-appropriate atrophy. No susceptibility artifact on gradient echo imaging to suggest hemorrhage. Cerebellar tonsil extends into the foramen magnum 2 mm or so. Pituitary gland normal. Orbits appear normal. No compression of optic chiasm extraocular muscles are normal in size. No orbital masses. Several foci of T2 prolongation in corona radiata and centrum semiovale. Largest in the right frontal lobe measures 8 mm and in the left parietal lobe millimeters 10 mm. Several additional smaller lesions are noted in the frontal and parietal lobes.     IMPRESSION: 1.   Nonspecific white matter hyperintensities likely due to chronic microvascular ischemic changes. Iatrogenic drug toxicity, graft-versus-host, inflammatory/infectious, hypertension, demyelinating process, trauma, chronic headaches, sequelae of vasculitis are other considerations. 2. Low-lying cerebellar tonsils. 3. No orbital masses. No infarction.     Cta Head    Result  Date: 12/26/2017  CTA OF THE HEAD INDICATION: Nausea, sweating, vomiting, diarrhea COMPARISON: MRI 12/26/2017, CT 12/24/2017 TECHNIQUE: Noncontrast CT the head performed followed by CTA of the head with intravenous contrast. Multiplanar two-dimensional and three-dimensional maximum intensity projection reformats performed and reviewed. DICOM format image data is available to non-affiliated external healthcare facilities or entities on a secure, media free, reciprocally searchable basis with patient authorization for 12 months following the date of the study. FINDINGS: Noncontrast CT of the head demonstrates no acute segmental infarct, intracranial hemorrhage, mass, midline shift, extra-axial fluid collection, or hydrocephalus. The vertebral arteries are codominant. There is no focal hemodynamically significant stenosis within the anterior or posterior circulation. The anterior and posterior communicating arteries appear  patent. There is no definitive aneurysm.     IMPRESSION: No focal hemodynamically significant stenosis.     Cta Neck    Result Date: 12/26/2017  CTA OF THE NECK WITH CONTRAST INDICATION: Speech difficulty, and balance COMPARISON: No relevant studies. TECHNIQUE: CTA of the neck was performed with nonionic intravenous contrast with multiplanar 2-dimensional and maximum intensity projection three-dimensional images. Stenosis was evaluated using the NASCET criteria. DICOM format image data is available to non-affiliated external healthcare facilities or entities on a secure, media free, reciprocally searchable basis with patient authorization for 12 months following the date of the study. FINDINGS: There is no hemodynamically significant stenosis within the carotid or vertebral arteries. There is no evidence of dissection. CAROTID STENOSIS REFERENCE USING NASCET CRITERIA % Stenosis = (1 - narrowest diameter/diameter of distal artery) x100 Mild: < 50% stenosis Moderate: 50-69% stenosis. Severe: 70-94%  stenosis. Near occlusion: 95-99% stenosis. Occluded: 100% stenosis     IMPRESSION: No focal hemodynamically significant stenosis.         Signed:  Clifton Custard, MD  12/26/2017  5:04 PM

## 2017-12-26 NOTE — Consults (Signed)
[place] REPORT  NAME:  [name], [name]  SEX:   F  ADMIT: 12/24/2017  DATE OF CONSULT: 12/26/2017  REFERRING PHYSICIAN:    DOB: Sep 20, 1960  MR#    [id number]  ROOM:  [id number]  ACCT#  [id number]    cc: [name] Aland MD    ATTENDING PHYSICIAN REQUESTING CONSULTATION:  Dr. Josie [name].    PATIENT LOCATION:  [place].    REASON FOR CONSULTATION:  Positive blood cultures immunocompromised patient.    IMPRESSION:  1.  Positive blood cultures, gram-positive cocci in clusters, 2 of 2 sets from 08/14 -- labs indicates from peripheral, patient does not recall but does have a PICC line.  She apparently refused to start IV vancomycin and meropenem ordered yesterday.  In addition to her PICC line with CNS symptoms, agree with need to rule out a CVA, possibly embolic from line or true endovascular such as endocarditis.  Agree also with Dr. Edd [name] that we will need LP if no CVA found.  2.  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  The chart indicates treaters there   [name] [name] [phone] or [phone], and Dr. Hart [name] [phone].  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  It appears she is also on Bactrim 3 times a week and acyclovir prophylaxis.  She presents with pancytopenia, chronic thrombocytopenia, but is not neutropenic with ANC yesterday of 1800.  3.  Left numbness, weakness, nausea and vision change with persistent diplopia beginning Monday, 08/12 -- CT head negative.  Further workup in progress.  4.  Diabetes mellitus on insulin.  5.  Hypertension.  6.  PENICILLIN ALLERGY.    RECOMMENDATIONS:  1.  Repeat blood cultures, line and peripheral, now and label site.  2.  Discussed with the patient.  She now agrees to start antibiotics pending more information.  3.  Discussed with the  microbiology labs with requested a PCR on gram-positive cocci in clusters for Staphylococcus aureus and MRSA per routine.  4.  Await MRI of brain, echocardiogram and neurology consult, which I understand is in progress.  5.  Agree with Dr. Edd [name] of oncology would pursue LP after platelets if workup negative for CVA.  Would include in CSF studies, AFB fungal, Niger ink and cryptococcal as well as routine cultures along with other studies as per oncology and neurology.  6.  Discussed with Dr. Josie [name].    Thank you for this consultation.  [facility] will follow with you.    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  The patient apparently had last intrathecal chemotherapy 1 month ago.  She tells me her PICC line has been in place since April.  Approximately 3 days ago, she developed nausea, vomiting, some sweating but also left arm numbness and weakness and vision change.  The symptoms improved somewhat with glucose but the diplopia persisted and she presented to the emergency room 8/14.  Blood cultures were drawn.  She was admitted by Dr. Morrie [name] who noted a negative echocardiogram, consulted neurology and oncology.  Her blood cultures were positive for gram-positive cocci in  clusters and he ordered vancomycin and meropenem.  The patient apparently declined antibiotics and an outside physician was contacted who recommended repeat blood cultures.  Today, infectious disease consultation was requested.  The patient is seen after MRI and echocardiogram.  She received Ativan apparently and is very drowsy now.  She does report some headache but no photophobia or neck pain or stiffness but persistent diplopia.  She also had some  abdominal discomfort but no shaking chills or rigors.    PAST MEDICAL HISTORY:  Medical illnesses:  Diabetes mellitus, AML, GVH, hypertension.    PAST SURGICAL HISTORY:  Left arm PICC April.    ALLERGIES:  PENICILLIN WITH ITCHING, RASH REPORTED.    MEDICATIONS:  Vancomycin, acyclovir, Entocort, vitamin D, insulin, meropenem which patient has not yet started, tacrolimus, Bactrim and vancomycin.      FAMILY HISTORY:  Noncontributory.    SOCIAL HISTORY:  The patient apparently lives at home.    REVIEW OF SYSTEMS:  Limited by patient's current sedation.    HEENT:  She reports some headache, no photophobia, no mouth ulcerations.  PULMONARY:  The patient denies shortness of breath or cough.  CARDIOVASCULAR:  The patient denies chest pain.  The patient denies history of murmur.  GASTROINTESTINAL:  The patient had some nausea and vomiting, no diarrhea, no cramping.  GENITOURINARY:  The patient denies dysuria.  NEUROMUSCULOSKELETAL:  The patient denies new rash or joint pain.    PHYSICAL EXAMINATION:  GENERAL:  The patient is a sedated woman who appears stated age, lying in bed in no acute distress.  VITAL SIGNS:  Temperature max 98.8, temperature 98.6, blood pressure 106/67, pulse 87, respiratory rate 14, O2 sat 100%.  HEENT:  Sclerae anicteric.  Conjunctivae pink.  Fundi not visualized.  Oropharynx with moist oral mucosa without ulcerations.  NECK:  Supple, without lymphadenopathy or thyromegaly.  CHEST:  Clear to auscultation and percussion.  CARDIOVASCULAR:  Regular rate and rhythm without rub, murmur or gallop.  ABDOMEN:  Soft and nontender, without hepatosplenomegaly, masses, CVA or suprapubic tenderness.  EXTREMITIES:  No clubbing, cyanosis or edema.  NEUROLOGIC:  The patient is easily aroused but drifts back to sleep.    LABORATORY DATA:  Sodium 140, potassium 4.7, chloride 108, bicarbonate 27, glucose 207, BUN 6, creatinine 0.7, AST 16, ALT 29, alkaline phosphatase 96, bilirubin 0.4, total protein 5.5, albumin 3.3,  ESR 45.  Hemoglobin A1c is 7, white count 3600, hematocrit 29, platelet count 53.  Differential yesterday 63 polys, 25 lymphs, 9 monos, 1 eosinophil.  08/14 blood cultures times 2 gram-positive cocci in clusters times 2, PCR requested.  Urine culture no growth to date.  CT of head normal 08/14.  Chest x-ray showed no acute disease with PICC in place.  Urinalysis showed 1 to 4 white cells, occasional bacteria.      ___________________  Lenna Sciara. Lovett Calender MD  Dictated By:.   [place]  D:12/26/2017 12:10:35  T: 12/26/2017 13:24:58  [id number]

## 2017-12-26 NOTE — Progress Notes (Signed)
Bedside shift change report given to Dion RN (oncoming nurse) by Rebekah RN (offgoing nurse). Report included the following information SBAR, Kardex, MAR and Recent Results.

## 2017-12-26 NOTE — Progress Notes (Signed)
Progress  Notes by Sundra Aland, MD at 12/26/17 [phone]                Author: Sundra Aland, MD  Service: Hospitalist  Author Type: Physician       Filed: 12/26/17 1254  Date of Service: 12/26/17 0835  Status: Addendum          Editor: Sundra Aland, MD (Physician)          Related Notes: Original Note by Sundra Aland, MD (Physician) filed at 12/26/17 1111                          INTERNAL MEDICINE PROGRESS NOTE      Date of note:      December 26, 2017      Patient:               [name] [name],  57 y.o., female   Admit Date:        12/24/2017   Length of Stay:  0 day(s)        Subjective/24 hr Interval events:        Stable.         Assessment:        1.  Diplopia with transient slurred speech, left upper extremity weakness, gait disturbances.  Concern for TIA/CVA, consider meningeal  malignancy   2.  Bacteremia, GPC clusters x 2 bottles, concern for blood stream infection   3.  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   5.  SIRS   6.  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  Pancytopenia secondary to above   8.  Insulin-dependent type 2 diabetes   9.  Chronic pain        Plan:        - MRI Brain      - Oncology and Neurology consulted      - IV ABx empirically for 2 of 2 GPC clusters. ID consulted.      - PTOT      - Continue home medications      - Patient follows at [place], Dr. Vinnie Level        Objective:          BP Readings from Last 1 Encounters:        12/26/17  106/67          Pulse Readings from Last 1 Encounters:        12/26/17  87          Resp Readings from Last 1 Encounters:        12/26/17  14          Temp Readings from Last 1 Encounters:        12/26/17  98.6 ??F (37 ??C)        @LASTSAO2 (1)@          Intake/Output Summary (Last 24 hours) at 12/26/2017  0835   Last data filed at 12/26/2017 0616     Gross per 24 hour        Intake  720 ml        Output  --        Net  720 ml  Physical Exam:        GEN - AAOx3, NAD   HEENT - NC/AT, PERRL, EOMI, mucous membranes moist   Neck - supple, no JVD   Cardiac - RRR, S1, S2, no murmurs   Chest/Lungs - clear to auscultation without wheezes or rhonchi   Abdomen - soft, nontender, nondistended, normal bowel sounds   Extremities - no C/C/E   Neuro - CN 2-12 intact. No focal deficits. No motor or sensory deficit appreciated.    Skin - no rashes or lesions        Current medications:           Current Facility-Administered Medications:    ?  acyclovir (ZOVIRAX) tablet 400 mg, 400 mg, Oral, BID, Einar Grad, MD, 400 mg at 12/25/17 2036   ?  cholecalciferol (VITAMIN D3) tablet 5,000 Units, 5,000 Units, Oral, DAILY, Einar Grad, MD, 5,000 Units at 12/25/17 1206   ?  dextrose (D50) infusion 5-25 g, 10-50 mL, IntraVENous, PRN, Einar Grad, MD   ?  glucagon (GLUCAGEN) injection 1 mg, 1 mg, IntraMUSCular, PRN, Einar Grad, MD   ?  insulin glargine (LANTUS) injection 1-100 Units, 1-100 Units, SubCUTAneous, QHS, Einar Grad, MD, 17 Units at 12/25/17 2129   ?  insulin lispro (HUMALOG) injection 1-100 Units, 1-100 Units, SubCUTAneous, AC&HS, Einar Grad, MD, Stopped at 12/25/17 1203   ?  insulin lispro (HUMALOG) injection 1-100 Units, 1-100 Units, SubCUTAneous, PRN, Einar Grad, MD, 1 Units at 12/25/17 2129   ?  oxyCODONE IR (ROXICODONE) tablet 10 mg, 10 mg, Oral, Q4H PRN, Einar Grad, MD   ?  potassium chloride (K-DUR, KLOR-CON) SR tablet 40 mEq, 40 mEq, Oral, BID, Einar Grad, MD, 40 mEq at 12/25/17 2035   ?  tacrolimus (PROGRAF) capsule 4 mg, 4 mg, Oral, BID, Einar Grad, MD, 4 mg at 12/25/17 2035   ?  prochlorperazine (COMPAZINE) tablet 10 mg, 10 mg, Oral, Q4H PRN, Einar Grad, MD, 10 mg at 12/25/17 1228   ?  zolpidem (AMBIEN) tablet 5 mg, 5 mg, Oral, QHS PRN, Einar Grad, MD, 5 mg at 12/25/17 2035   ?   trimethoprim-sulfamethoxazole (BACTRIM DS, SEPTRA DS) 160-800 mg per tablet 1 Tab, 1 Tab, Oral, Q MON, WED & Ludwig Clarks, Einar Grad, MD   ?  naloxone [facility]) injection 0.1 mg, 0.1 mg, IntraVENous, PRN, Einar Grad, MD   ?  acetaminophen (TYLENOL) tablet 650 mg, 650 mg, Oral, Q4H PRN **OR** acetaminophen (TYLENOL) solution 650 mg, 650 mg, Oral, Q4H PRN **OR** acetaminophen (TYLENOL) suppository 650 mg, 650 mg, Rectal, Q4H PRN, Einar Grad, MD   ?  nilotinib (TASIGNA) capsule cap 200 mg  (Patient Supplied), 200 mg, Oral, BID, Einar Grad, MD, 200 mg at 12/25/17 2100   ?  LORazepam (ATIVAN) tablet 1 mg, 1 mg, Oral, ON CALL, Einar Grad, MD   ?  budesonide (ENTOCORT EC) capsule 6 mg, 6 mg, Oral, TID, Jasarevic, Muhamed, MD, 6 mg at 12/25/17 2130   ?  vancomycin (VANCOCIN) 1000 mg in NS 250 ml infusion, 1,000 mg, IntraVENous, Q12H, Einar Grad, MD   ?  Derrill Memo ON 12/27/2017] Vancomycin trough due, , Other, ONCE, Einar Grad, MD   ?  *Pharmacy dosing vancomycin, 1 Each, Other, Rx Dosing/Monitoring, Einar Grad, MD   ?  meropenem (MERREM) 500 mg in 0.9% sodium chloride (MBP/ADV) 50 mL MBP, 0.5 g, IntraVENous, Q8H, Einar Grad, MD        Labs:          Recent Results (  from the past 24 hour(s))     GLUCOSE, POC          Collection Time: 12/25/17 10:53 AM         Result  Value  Ref Range            Glucose (POC)  133 (H)  65 - 105 mg/dL       GLUCOSE, POC          Collection Time: 12/25/17  4:42 PM         Result  Value  Ref Range            Glucose (POC)  153 (H)  65 - 105 mg/dL       SED RATE (ESR)          Collection Time: 12/25/17  5:25 PM         Result  Value  Ref Range            Sed rate (ESR)  45 (H)  0 - 30 mm/Hr       RHEUMATOID FACTOR, QL          Collection Time: 12/25/17  5:25 PM         Result  Value  Ref Range            Rheumatoid factor  <10  0 - 14 IU/ml       CK          Collection Time: 12/25/17  5:25 PM         Result  Value  Ref Range            CK  24 (L)  26 - 192 U/L       GLUCOSE, POC           Collection Time: 12/25/17  7:44 PM         Result  Value  Ref Range            Glucose (POC)  213 (H)  65 - 105 mg/dL       CBC W/O DIFF          Collection Time: 12/26/17  4:40 AM         Result  Value  Ref Range            WBC  3.6 (L)  4.0 - 11.0 1000/mm3       RBC  2.83 (L)  3.60 - 5.20 M/uL       HGB  9.8 (L)  13.0 - 17.2 gm/dl       HCT  29.0 (L)  37.0 - 50.0 %       MCV  102.5 (H)  80.0 - 98.0 fL       MCH  34.6  25.4 - 34.6 pg       MCHC  33.8  30.0 - 36.0 gm/dl       PLATELET  53 (L)  140 - 450 1000/mm3       MPV  9.3  6.0 - 10.0 fL       RDW-SD  56.6 (H)  36.4 - 84.1         METABOLIC PANEL, BASIC          Collection Time: 12/26/17  4:40 AM         Result  Value  Ref Range            Sodium  140  136 - 145 mEq/L  Potassium  4.7  3.5 - 5.1 mEq/L       Chloride  108 (H)  98 - 107 mEq/L       CO2  27  21 - 32 mEq/L       Glucose  207 (H)  74 - 106 mg/dl       BUN  6 (L)  7 - 25 mg/dl       Creatinine  0.7  0.6 - 1.3 mg/dl       GFR est AA  >60.0          GFR est non-AA  >60          Calcium  8.9  8.5 - 10.1 mg/dl       Anion gap  6  5 - 15 mmol/L       MAGNESIUM          Collection Time: 12/26/17  4:40 AM         Result  Value  Ref Range            Magnesium  1.4 (L)  1.6 - 2.6 mg/dl       LIPID PANEL          Collection Time: 12/26/17  4:40 AM         Result  Value  Ref Range            Cholesterol, total  174  140 - 199 mg/dl       HDL Cholesterol  57  40 - 96 mg/dl       Triglyceride  145  29 - 150 mg/dl       LDL, calculated  88  0 - 130 mg/dl       CHOL/HDL Ratio  3.1  0.0 - 4.4 Ratio       HEMOGLOBIN A1C W/O EAG          Collection Time: 12/26/17  4:40 AM         Result  Value  Ref Range            Hemoglobin A1c  7.0 (H)  4.2 - 6.3 %           XR Results:     Results from Hospital Encounter encounter on 12/24/17     XR CHEST PA LAT           Narrative  Chest      Indication: hx of GVHD with N/V/D.       Comparison: 05/20/2017      Findings:      Frontal and lateral views of the chest demonstrate  that the cardiac silhouette   is not enlarged.  There is no focal infiltrate, pleural effusion, vascular   congestion, or pneumothorax.  Left PICC with tip overlying the distal superior   vena cava.              Impression  Impression:       No acute cardiopulmonary disease.              CT Results:     Results from Hospital Encounter encounter on 12/24/17     CT HEAD WO CONT           Narrative  DICOM format image data is available to non-affiliated external healthcare   facilities or entities on a secure, media free, reciprocally searchable basis   with patient authorization for 12 months following the date of the study.  Clinical history: Diplopia      EXAMINATION:   Noncontrast CT scan of the head 12/24/2017. 4 mm axial scanning is performed from   the skull base to the vertex. Coronal and sagittal reconstruction imaging has   been obtained.      Correlation: None      FINDINGS:   Visualized paranasal sinuses and mastoid air cells are clear. No acute   intracranial hemorrhage, mass or infarction.              Impression  IMPRESSION:   Normal unenhanced CT scan of the head.      Initial interpretation: [facility]              MRI Results:   No results found for this or any previous visit.      Nuclear Medicine Results:   No results found for this or any previous visit.      [place] Results:   No results found for this or any previous visit.      IR Results:   No results found for this or any previous visit.      VAS/US Results:     Results from Hospital Encounter encounter on 04/25/17     DUPLEX LOWER EXT VENOUS BILAT           Narrative                                                               Study ID: [id number]                                                     [facility]                                             [address] [employer]  [place],                                          Needles                                       Lower Extremity Venous Report      Name: FELISE, GEORGIA Date: 04/25/2017 08:46 AM   MRN: [id number]                     Patient Location: CD06   DOB: [birth year]                  Age: 24 yrs   Gender: Female                   Account #: [id number]   Reason For Study: Elevated D-dimer   Ordering Physician: Meredeth Ide   Performed By: Wannetta Sender      Interpretation Summary   No evidence of acute superficial or deep vein thrombosis noted in either   lower extremity .      _____________________________________________________________________________   Verneda Skill   Complete bilateral venous duplex performed. ICD 10: R78.87.      HISTORY/SYMPTOMS   Cancer. Elevated d-dimer.      RIGHT LEG   The right common femoral, femoral, popliteal, posterior tibial and peroneal   veins were examined with duplex ultrasound. The deep veins were patent and   compressible with no evidence of intraluminal thrombus. Spontaneous, phasic   venous flow with normal augmentation was noted throughout the right leg.   RIGHT SAPHENOUS VEINS   Compression of the right proximal greater saphenous vein is complete.   Spectral Doppler exam demonstrates normal and spontaneous venous flow in the   right proximal greater saphenous vein.      LEFT LEG   The left common femoral, femoral, popliteal, posterior tibial and peroneal   veins were examined with duplex ultrasound. The deep veins were patent and   compressible with no evidence of intraluminal thrombus. Spontaneous, phasic   venous flow with normal augmentation was noted throughout the left leg.      LEFT LEG SAPHENOUS VEINS   Compression of the left proximal greater saphenous vein is complete. Spectral   Doppler exam demonstrates normal and spontaneous venous  flow in the left   proximal greater saphenous vein.         Electronically signed byDr. Judithe Modest, M.D   04/25/2017 05:31 PM                 Total Clinical time 25 minutes, including examining patient, review of current and past medical history in chart (labs, imaging, notes) and extensive discussion with family.          Sundra Aland, MD   Hospitalist   [facility]   December 26, 2017   8:35 AM

## 2017-12-26 NOTE — Progress Notes (Signed)
Prelim ID Consult (see Dictation [id number] [phone])    Patient: [name] [name] CSN: [id number]     Date of Birth: 1960-10-29  Age: 57 y.o.  Sex: female        Current abx Prior abx   Prophylactic Bactrim tiw, ACV vanco/meropenem 8/16-0       Assessment:     Pos bctx's GPC cl 2 of 2 8/14- lab indicates from peripheral, pt not recall   -refused iv vanco/meropenem ordered yesterday  -has picc, with CNS sx need to r/o CVA poss embolic, line infection or true endovascular; agree with Dr. Edd Arbour will need LP if no CVA found   AML sp chemo/stem cell xplant  -h/o meningeal leukemia on IT chemo in [place] in [place], last ~1 mo ago per Tan's note -- Lennie Muckle [phone] or [phone]; Dr. Hart Carwin [phone]  -on tassigra comp GVH skin, esophagus   -bactrim tiw, acyclovir prophylaxis  -pancytopenia ch thrombocytopenia, not neutropenic (ANC 8/15 1800)    L numbness, weakness n/v vision change diplopia began Monday 8/12- CTH neg   DM on insulin    HTN             PCN allergy       Rec:   ->repeat bctx's line and peripheral now  And label site  ->d/w patient now agree to start abx  ->d/w lab to do PCR on GPC cl   ->await MRI  ->await echo   ->Neurol to see  ->agree with Dr. Edd Arbour would pursue LP p plt if w/u neg for CVA.   Include afb fungal II CrAG routine cultures along with other studies per Onc/Neurology   -d/w Dr. Josie Saunders     MICROBIOLOGY:   8/14 bctx x 2 GPC cl   uctx ngtd    LINES AND CATHETERS:   PICC    Thanks -- [facility] will follow with you    Enijah Furr. Lovett Calender, MD  [facility]  December 26, 2017  [phone]

## 2017-12-26 NOTE — Progress Notes (Signed)
2D echocardiogram was completed.

## 2017-12-26 NOTE — Progress Notes (Signed)
1922: RN took patient's blood sugar. RN educated patient to notify oncoming RN when she is finished eating so she can receive her dinner insulin. Patient stated she did not want to receive two different shots (one to cover meal and one to cover correction for her blood sugar). Oncoming RN made aware.

## 2017-12-26 NOTE — Consults (Signed)
[facility] REPORT  NAME:  [name] [name], [name] [name]  SEX:   F  ADMIT: 12/24/2017  DATE OF CONSULT: 12/25/2017  REFERRING PHYSICIAN:    DOB: 17-Jun-1960  MR#    [id number]  ROOM:  [id number]  ACCT#  [id number]        NEUROLOGY CONSULTATION    HISTORY OF PRESENT ILLNESS:  The patient is a 57 year old female with history of AML, status post bone marrow transplant in April 2019 who presented to the hospital with 3 to 4 days' history of blurred vision, double vision, left face and left arm numbness, nausea and vomiting.  When her symptoms started on Monday with blurry vision, slurred speech and left arm numbness and weakness, she noted her blood sugar was around 70.  She contacted her oncologist at the [facility] in [place], who thought she may have hypoglycemia related symptoms and advised to increase glucose intake.  Her slurred speech and left arm weakness had gone away.  However, she continued to have blurred vision that gradually transitioned to double vision.  She was also having some unsteady gait.  She therefore presented to the emergency room and admitted for further management.    REVIEW OF SYSTEMS:  NEUROLOGY:  She also complains of dull, aching, pressure type headache over her forehead.  She continues to have double vision.  Looking on either gaze, up or down, the images are side by side.  She denies droopiness of her eyelids or ocular pain.  She denies any symptoms of speech disturbances like difficulty expressing herself or understanding others, numbness or weakness in her arms or legs, any significant gait or sphincter disturbances, blackout episode or seizure activity.  EYES:  Double vision as described.  ENT:  No dizziness.  CARDIOLOGY:  No chest pain.  RESPIRATORY:  No shortness of breath.  GASTROENTEROLOGY:  Nausea and vomiting as described.    GENITOURINARY:  History of diabetes.  HEMATOLOGY:  No bleeding disorder.  DERMATOLOGY:  No skin rash.  CONSTITUTIONAL:  No  recent infection or trauma.  PSYCHIATRY:  No history of anxiety or depression.    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  She does not have any history of hypertension, hyperlipidemia, coronary artery disease, stroke or seizure disorder.    PAST SURGICAL HISTORY:  Bone marrow transplantation in April 2019.    PERSONAL AND SOCIAL HISTORY:  No smoking, alcohol or recreational drug use.    CURRENT MEDICATIONS:  Zovirax, Cymbalta, Lantus and Humalog insulin, Ativan, meropenem, Prograf, Bactrim DS, oxycodone and Compazine.    DRUG ALLERGIES:  PENICILLIN.    FAMILY HISTORY:  Noncontributory.    PHYSICAL EXAMINATION:  VITAL SIGNS:  Blood pressure 63, pulse 91, respirations 16, temperature 97.7 degrees Fahrenheit.  GENERAL APPEARANCE:  Well built, well nourished.  HEENT:  Normocephalic, atraumatic.  NECK:  Supple.  DERMATOLOGIC:  No skin rash is seen.  MUSCULOSKELETAL:  No joint deformity is seen.  PSYCHIATRY:  Mood and affect appropriate.    NEUROLOGICAL EXAMINATION:    She is awake, alert and oriented times 3.  Her speech is clear.  Her comprehension is intact.  No aphasia is elicited.  Cranial nerves II-XII are intact, both pupils equal and reacting to light.  Field of vision is intact bilaterally.  Extraocular movements are full.  No nystagmus is seen.  No ptosis is seen.  No facial weakness is seen.  The remainder of  cranial nerves are intact.  She had normal muscle tone and normal muscle strength in all 4 extremities.  No sensory deficit is elicited.  Deep tendon reflexes are 1+ bilaterally and symmetrical.  Both plantar reflexes are flexor.  Finger-to-nose and heel-to-shin test intact on both sides.  Gait not tested.    DATA REVIEW:  CBC:  WBC 2.9, hemoglobin 10.4, hematocrit 29.5, platelets 55.  Comprehensive metabolic panel normal except glucose 239.  Hemoglobin A1c 7.  Lipid profile:   Triglycerides 145, cholesterol 174, HDL 57, LDL 88.    CT scan of brain without contrast was obtained on 12/24/2017 that was normal.  No acute ischemic infarct, bleed or mass lesion was seen.    IMPRESSION:  1.  Acute onset of left face and left arm numbness, some unsteadiness of gait and double vision, etiology probably vascular in nature at the level of brainstem in the vertebrobasilar artery distribution.  However, peripheral etiology for her symptoms like ocular myasthenia gravis or atypical Idamae Schuller syndrome cannot be excluded.  1.  History of diabetes mellitus.  2.  History of acute myelocytic leukemia, status post bone marrow transplantation, followed by oncologist at [facility].    PLAN:  1.  Neuro checks and seizure precautions.  2.  I discussed with her about various possible etiology for symptoms.  3.  I will recommend obtaining MRI of brain without contrast to look for any evidence of acute ischemic infarct in the brainstem.  4.  I will recommend to obtain CT angiogram of neck and head for evaluation of intra- and extracranial circulation.  5.  I will recommend to check her blood for CPK, TSH, RA test, ANA, myasthenia gravis panel and MuSK antibody titer.  6.  Continue other medical management as per Dr. Morrie Sheldon, the hospitalist.    I once again thank you for requesting me to see the patient on neurological consultation for my neurological opinion and advice.      ___________________  Clifton Custard MD  Dictated By:.   MLT  D:12/26/2017 17:03:14  T: 12/26/2017 17:31:23  [id number]

## 2017-12-26 NOTE — Progress Notes (Signed)
 Progress Notes by Conny Pleasant Anette Jenkins at 12/26/17 1333                Author: Conny Pleasant Anette Jenkins  Service: --  Author Type: Registered Nurse       Filed: 12/26/17 1659  Date of Service: 12/26/17 1333  Status: Addendum          Editor: Conny Pleasant Anette Jenkins (Registered Nurse)          Related Notes: Original Note by Conny Pleasant Anette Jenkins (Registered Nurse) filed at 12/26/17  1659               Page Sent 1333            PAGER ID: [phone]   MESSAGE: [phone]. Jerrilyn Holzer. Pt. agitated, stating she is not neutropenic.  Demanding to have certain foods that she cannot have on neutropenic precautions? Rebekah, RN [phone]         -->Patient is refusing all PO medications until she gets her trey.       1339: MD ordered regular diet and to d/c neutropenic precautions. Patient on phone with patient advocate.       1342: RN called dietary to inform them patient's diet had been changed.       1533 Page Sent             PAGER ID: [phone]   MESSAGE: [phone]. Amery Keim.Pt. is requesting to have Cymbalta , protonix,  magnesium  ox and MG-PLUS ordered. RN updated med list. Pt. med list however states the MG-PLUS is discontinued. Patient states she still takes this. Thanks. Pleasant, RN [phone]      1535 Page Sent             PAGER ID: [phone]   MESSAGE: [phone]. Alzena Studnicka. Pt. requesting for someone who knows how to  do it insert her IV. IV she has now is infiltrated and she states we are not allowed to use PICC d/t her chemo. Thanks. Pleasant, RN [phone]      1659   Page Sent             PAGER ID: [phone]   MESSAGE: [phone]. Margi Tobler. IV poss. infiltrate. Pt. c/o pain every time  RN flushes and no blood return. PICC cannot place any more IVs d/t pt. condition. Patient refusing to allow use of PICC. IV antibiotics cannot be administered. Pleasant, RN [phone]

## 2017-12-26 NOTE — Progress Notes (Signed)
 Patient admitted on 12/24/2017 from home with   Chief Complaint   Patient presents with   . Nausea   . Double Vision   . Diarrhea        The patient has been admitted to the hospital 2 times in the past 12 months.    Tentative dc plan:    home    Anticipated Discharge Date:   12/28/2017    Would you like any one involved in your discharge plan Brother and mother.    PCP: Declined TCC referral     Face sheet information, address, contact info and insurance verified  yes    [facility]  Any issues getting medications  no    Home Environment:    Lives at [address] [place] [address]    @[email or web address] @.     Prior to admission open services:     na    Extended Emergency Contact Information  Primary Emergency Contact: LAMB, THOMAS  Mobile Phone: [phone]  Relation: Brother      Transportation:     Family will transport home    Therapy Recommendations: na    Case Management Assessment    ABUSE/NEGLECT SCREENING   Physical Abuse/Neglect: Denies   Sexual Abuse: Denies   Sexual Abuse: Denies   Other Abuse/Issues: Denies          PRIMARY DECISION MAKER                                   CARE MANAGEMENT INTERVENTIONS       PCP Verified by CM: No(Declined TCC referral)                                                   Current Support Network: Relative's Home   Reason for Referral: DCP Rounds   History Provided By: Patient   Patient Orientation: Alert and Oriented   Cognition: Alert   Support System Response: Concerned   Previous Living Arrangement: Lives with Family Independent       Prior Functional Level: Independent in ADLs/IADLs   Current Functional Level: Independent in ADLs/IADLs   Primary Language: English   Can patient return to prior living arrangement: Yes   Ability to make needs known:: Good   Family able to assist with home care needs:: Yes                       Confirm Follow Up Transport: Family       Plan discussed with Pt/Family/Caregiver: Yes          DISCHARGE LOCATION   Discharge Placement: Home

## 2017-12-26 NOTE — Progress Notes (Signed)
Progress  Notes by [name], MD at 12/26/17 [phone]                Author: [name], MD  Service: ONCOLOGY  Author Type: Physician       Filed: 12/26/17 0817  Date of Service: 12/26/17 0814  Status: Signed          Editor: [name], MD (Physician)                     Hematology / Oncology Progress Note         Patient: [name] [name]  Gender:  female   MRN: [id number]      Date of Birth: [birth year] Age: 57 y.o.    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  Currently on to tasiigna.   History of prior meningeal  leukemia.  Currently on intrathecal chemotherapy being given in cancer treatment [facility] of [name] in [place].  Last dose was about a month or so ago.   ? TIA/stroke.  Continue treatment as per medicine and neurology.  If work-up is negative she will need a spinal tap to rule out meningeal leukemia.   Thrombocytopenia chronic.  If she needs LP she will need platelet transfusion prior to the spinal tap.        Subjective:     Admitted for diplopia and possible stroke.        Objective:        Visit Vitals      BP  106/67 (BP 1 Location: Right arm, BP Patient Position: Supine)     Pulse  87     Temp  98.6 ??F (37 ??C)     Resp  14     Ht  5' (1.524 m)     Wt  66.1 kg (145 lb 11.6 oz)     SpO2  100%        BMI  22.16 kg/m??                 Intake/Output Summary (Last 24 hours) at 12/26/2017 0815   Last data filed at 12/26/2017 0616     Gross per 24 hour        Intake  720 ml        Output  --        Net  720 ml             Review of Systems:     Constitutional: negative for fevers, chills, sweats. some fatigue   Eyes: negative for visual disturbance, redness and icterus   Ears, Nose, Mouth, Throat, and Face: negative for tinnitus, epistaxis,  sore mouth and hoarseness   Respiratory: negative for cough, sputum, hemoptysis, pleurisy/chest pain or wheezing   Cardiovascular: negative for chest pain, chest pressure/discomfort, palpitations, irregular heart beats, syncope, paroxysmal nocturnal dyspnea   Gastrointestinal: negative for reflux symptoms, vomiting, change in bowel habits, melena, diarrhea, constipation and abdominal pain.  Has some nausea.   Genitourinary:negative for dysuria, nocturia, urinary incontinence, hesitancy and hematuria  Integument: negative for rash, skin lesion(s) and pruritus   Hematologic/Lymphatic: negative for easy bruising, bleeding and lymphadenopathy   Musculoskeletal:negative for myalgias, arthralgias and bone pain   Neurological: negative for seizures, paresthesia and weakness.  Has headaches dizziness and also diplopia.  No blind spots no blurred vision.        Physical Assessment:     Constitutional: Alert, oriented, not in distress   Eyes: PERRLA, anicteric, no redness   Ears, nose, mouth, throat, and face: no palpable Lymph nodes, no mucositis, no thrush.   Respiratory: symmetrical expansion, no rales, no rhonchi, no wheezing.   Cardiovascular: S1S2, no pathologic murmur, no rub.   Gastrointestinal: soft, benign, non tender, no HSM, normal bowel sounds, no mass.   Integument: no rash, no petechiae, no ecchymosis.   Musculoskeletal: no edema, no cyanosis, no clubbing.   Neurological: intact, cranial nerves, no focal motor or sensory deficits.        Diagnostic test:          Basic Metabolic Profile     Recent Labs         12/26/17   0440  12/24/17   2256  12/24/17   2230      NA  140   --   139      K  4.7   --   4.4      CL  108*   --   105      CO2  27   --   25      BUN  6*   --   10      CREA  0.7  0.7  0.8      GLU  207*   --   239*      CA  8.9   --   8.4*      MG  1.4*   --   2.2                   CBC w/Diff       Recent Labs         12/26/17   0440  12/25/17   0010      WBC  3.6*  2.9*      HGB  9.8*  10.4*       HCT  29.0*  29.5*      PLT  53*  55*           Recent Labs         12/25/17   0010      GRANS  63.3                   CHEMISTRY     Lab Results      Component  Value  Date        ALB  3.3 (L)  12/24/2017        TP  5.5 (L)  12/24/2017        CBIL  0.1  12/24/2017        TBILI  0.4  12/24/2017        ALT  29  12/24/2017        SGOT  16  12/24/2017        AP  96  12/24/2017        LPSE  41 (L)  12/24/2017                   Coagulation  Lab Results      Component  Value  Date/Time        Prothrombin time  12.1  12/25/2017 03:18 AM        Prothrombin time  14.1 (H)  05/13/2017 08:33 AM        Prothrombin time  12.5  04/25/2017 07:04 AM        INR  1.0  12/25/2017 03:18 AM        INR  1.2 (H)  05/13/2017 08:33 AM        INR  1.1  04/25/2017 07:04 AM                  Current Medications:        Current Facility-Administered Medications:    ?  acyclovir (ZOVIRAX) tablet 400 mg, 400 mg, Oral, BID, Einar Grad, MD, 400 mg at 12/25/17 2036   ?  cholecalciferol (VITAMIN D3) tablet 5,000 Units, 5,000 Units, Oral, DAILY, Einar Grad, MD, 5,000 Units at 12/25/17 1206   ?  dextrose (D50) infusion 5-25 g, 10-50 mL, IntraVENous, PRN, Einar Grad, MD   ?  glucagon (GLUCAGEN) injection 1 mg, 1 mg, IntraMUSCular, PRN, Einar Grad, MD   ?  insulin glargine (LANTUS) injection 1-100 Units, 1-100 Units, SubCUTAneous, QHS, Einar Grad, MD, 17 Units at 12/25/17 2129   ?  insulin lispro (HUMALOG) injection 1-100 Units, 1-100 Units, SubCUTAneous, AC&HS, Einar Grad, MD, Stopped at 12/25/17 1203   ?  insulin lispro (HUMALOG) injection 1-100 Units, 1-100 Units, SubCUTAneous, PRN, Einar Grad, MD, 1 Units at 12/25/17 2129   ?  oxyCODONE IR (ROXICODONE) tablet 10 mg, 10 mg, Oral, Q4H PRN, Einar Grad, MD   ?  potassium chloride (K-DUR, KLOR-CON) SR tablet 40 mEq, 40 mEq, Oral, BID, Einar Grad, MD, 40 mEq at 12/25/17 2035   ?  tacrolimus (PROGRAF) capsule 4 mg, 4 mg, Oral, BID, Einar Grad, MD, 4 mg at 12/25/17 2035   ?   prochlorperazine (COMPAZINE) tablet 10 mg, 10 mg, Oral, Q4H PRN, Einar Grad, MD, 10 mg at 12/25/17 1228   ?  zolpidem (AMBIEN) tablet 5 mg, 5 mg, Oral, QHS PRN, Einar Grad, MD, 5 mg at 12/25/17 2035   ?  trimethoprim-sulfamethoxazole (BACTRIM DS, SEPTRA DS) 160-800 mg per tablet 1 Tab, 1 Tab, Oral, Q MON, WED & Ludwig Clarks, Einar Grad, MD   ?  naloxone [facility]) injection 0.1 mg, 0.1 mg, IntraVENous, PRN, Einar Grad, MD   ?  acetaminophen (TYLENOL) tablet 650 mg, 650 mg, Oral, Q4H PRN **OR** acetaminophen (TYLENOL) solution 650 mg, 650 mg, Oral, Q4H PRN **OR** acetaminophen (TYLENOL) suppository 650 mg, 650 mg, Rectal, Q4H PRN, Einar Grad, MD   ?  nilotinib (TASIGNA) capsule cap 200 mg  (Patient Supplied), 200 mg, Oral, BID, Einar Grad, MD, 200 mg at 12/25/17 2100   ?  LORazepam (ATIVAN) tablet 1 mg, 1 mg, Oral, ON CALL, Einar Grad, MD   ?  budesonide (ENTOCORT EC) capsule 6 mg, 6 mg, Oral, TID, Jasarevic, Muhamed, MD, 6 mg at 12/25/17 2130   ?  vancomycin (VANCOCIN) 1000 mg in NS 250 ml infusion, 1,000 mg, IntraVENous, Q12H, Einar Grad, MD   ?  Derrill Memo ON 12/27/2017] Vancomycin trough due, , Other, ONCE, Einar Grad, MD   ?  *Pharmacy dosing vancomycin, 1 Each, Other, Rx Dosing/Monitoring, Einar Grad, MD   ?  meropenem (MERREM) 500 mg in 0.9% sodium chloride (MBP/ADV) 50 mL MBP, 0.5 g, IntraVENous, Q8H, Einar Grad, MD  [name], MD    [facility]   Office [phone]

## 2017-12-26 NOTE — Progress Notes (Addendum)
INTERNAL MEDICINE PROGRESS NOTE    Date of note:      December 26, 2017    Patient:               [name] [name], 57 y.o., female  Admit Date:        12/24/2017  Length of Stay:  0 day(s)    Subjective/24 hr Interval events:     Stable.     Assessment:     1. Diplopia with transient slurred speech, left upper extremity weakness, gait disturbances.  Concern for TIA/CVA, consider meningeal malignancy  2. Bacteremia, GPC clusters x 2 bottles, concern for blood stream infection  3. 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  5. SIRS  6. 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 Pancytopenia secondary to above  8. Insulin-dependent type 2 diabetes  9. Chronic pain    Plan:     - MRI Brain    - Oncology and Neurology consulted    - IV ABx empirically for 2 of 2 GPC clusters. ID consulted.    - PTOT    - Continue home medications    - Patient follows at [facility], Dr. Vinnie Level    Objective:     BP Readings from Last 1 Encounters:   12/26/17 106/67     Pulse Readings from Last 1 Encounters:   12/26/17 87     Resp Readings from Last 1 Encounters:   12/26/17 14     Temp Readings from Last 1 Encounters:   12/26/17 98.6 ??F (37 ??C)     '@LASTSAO2'$ (1)@       Intake/Output Summary (Last 24 hours) at 12/26/2017 0835  Last data filed at 12/26/2017 0616  Gross per 24 hour   Intake 720 ml   Output ???   Net 720 ml       Physical Exam:     GEN - AAOx3, NAD  HEENT - NC/AT, PERRL, EOMI, mucous membranes moist  Neck - supple, no JVD  Cardiac - RRR, S1, S2, no murmurs  Chest/Lungs - clear to auscultation without wheezes or rhonchi  Abdomen - soft, nontender, nondistended, normal bowel sounds  Extremities - no C/C/E  Neuro - CN 2-12 intact. No focal deficits. No motor or sensory deficit appreciated.    Skin - no rashes or lesions    Current medications:       Current Facility-Administered Medications:   ???  acyclovir (ZOVIRAX) tablet 400 mg, 400 mg, Oral, BID, Einar Grad, MD, 400 mg at 12/25/17 2036  ???  cholecalciferol (VITAMIN D3) tablet 5,000 Units, 5,000 Units, Oral, DAILY, Einar Grad, MD, 5,000 Units at 12/25/17 1206  ???  dextrose (D50) infusion 5-25 g, 10-50 mL, IntraVENous, PRN, Einar Grad, MD  ???  glucagon (GLUCAGEN) injection 1 mg, 1 mg, IntraMUSCular, PRN, Einar Grad, MD  ???  insulin glargine (LANTUS) injection 1-100 Units, 1-100 Units, SubCUTAneous, QHS, Einar Grad, MD, 17 Units at 12/25/17 2129  ???  insulin lispro (HUMALOG) injection 1-100 Units, 1-100 Units, SubCUTAneous, AC&HS, Einar Grad, MD, Stopped at 12/25/17 1203  ???  insulin lispro (HUMALOG) injection 1-100 Units, 1-100 Units, SubCUTAneous, PRN, Einar Grad, MD, 1 Units at 12/25/17 2129  ???  oxyCODONE IR (ROXICODONE) tablet 10 mg, 10 mg, Oral, Q4H PRN, Einar Grad, MD  ???  potassium chloride (  K-DUR, KLOR-CON) SR tablet 40 mEq, 40 mEq, Oral, BID, Einar Grad, MD, 40 mEq at 12/25/17 2035  ???  tacrolimus (PROGRAF) capsule 4 mg, 4 mg, Oral, BID, Einar Grad, MD, 4 mg at 12/25/17 2035  ???  prochlorperazine (COMPAZINE) tablet 10 mg, 10 mg, Oral, Q4H PRN, Einar Grad, MD, 10 mg at 12/25/17 1228  ???  zolpidem (AMBIEN) tablet 5 mg, 5 mg, Oral, QHS PRN, Einar Grad, MD, 5 mg at 12/25/17 2035  ???  trimethoprim-sulfamethoxazole (BACTRIM DS, SEPTRA DS) 160-800 mg per tablet 1 Tab, 1 Tab, Oral, Q MON, WED & Gara Kroner, MD  ???  naloxone (NARCAN) injection 0.1 mg, 0.1 mg, IntraVENous, PRN, Einar Grad, MD  ???  acetaminophen (TYLENOL) tablet 650 mg, 650 mg, Oral, Q4H PRN **OR** acetaminophen (TYLENOL) solution 650 mg, 650 mg, Oral, Q4H PRN **OR** acetaminophen (TYLENOL) suppository 650 mg, 650 mg, Rectal, Q4H PRN, Einar Grad, MD  ???  nilotinib (TASIGNA) capsule cap 200 mg  (Patient Supplied), 200 mg,  Oral, BID, Einar Grad, MD, 200 mg at 12/25/17 2100  ???  LORazepam (ATIVAN) tablet 1 mg, 1 mg, Oral, ON CALL, Einar Grad, MD  ???  budesonide (ENTOCORT EC) capsule 6 mg, 6 mg, Oral, TID, Dawson Bills, Muhamed, MD, 6 mg at 12/25/17 2130  ???  vancomycin (VANCOCIN) 1000 mg in NS 250 ml infusion, 1,000 mg, IntraVENous, Q12H, Einar Grad, MD  ???  [START ON 12/27/2017] Vancomycin trough due, , Other, ONCE, Einar Grad, MD  ???  *Pharmacy dosing vancomycin, 1 Each, Other, Rx Dosing/Monitoring, Einar Grad, MD  ???  meropenem (MERREM) 500 mg in 0.9% sodium chloride (MBP/ADV) 50 mL MBP, 0.5 g, IntraVENous, Q8H, Einar Grad, MD    Labs:     Recent Results (from the past 24 hour(s))   GLUCOSE, POC    Collection Time: 12/25/17 10:53 AM   Result Value Ref Range    Glucose (POC) 133 (H) 65 - 105 mg/dL   GLUCOSE, POC    Collection Time: 12/25/17  4:42 PM   Result Value Ref Range    Glucose (POC) 153 (H) 65 - 105 mg/dL   SED RATE (ESR)    Collection Time: 12/25/17  5:25 PM   Result Value Ref Range    Sed rate (ESR) 45 (H) 0 - 30 mm/Hr   RHEUMATOID FACTOR, QL    Collection Time: 12/25/17  5:25 PM   Result Value Ref Range    Rheumatoid factor <10 0 - 14 IU/ml   CK    Collection Time: 12/25/17  5:25 PM   Result Value Ref Range    CK 24 (L) 26 - 192 U/L   GLUCOSE, POC    Collection Time: 12/25/17  7:44 PM   Result Value Ref Range    Glucose (POC) 213 (H) 65 - 105 mg/dL   CBC W/O DIFF    Collection Time: 12/26/17  4:40 AM   Result Value Ref Range    WBC 3.6 (L) 4.0 - 11.0 1000/mm3    RBC 2.83 (L) 3.60 - 5.20 M/uL    HGB 9.8 (L) 13.0 - 17.2 gm/dl    HCT 29.0 (L) 37.0 - 50.0 %    MCV 102.5 (H) 80.0 - 98.0 fL    MCH 34.6 25.4 - 34.6 pg    MCHC 33.8 30.0 - 36.0 gm/dl    PLATELET 53 (L) 140 - 450 1000/mm3    MPV 9.3 6.0 - 10.0 fL    RDW-SD 56.6 (H) 36.4 - 46.3  METABOLIC PANEL, BASIC    Collection Time: 12/26/17  4:40 AM   Result Value Ref Range    Sodium 140 136 - 145 mEq/L    Potassium 4.7 3.5 - 5.1 mEq/L    Chloride 108 (H) 98 - 107 mEq/L     CO2 27 21 - 32 mEq/L    Glucose 207 (H) 74 - 106 mg/dl    BUN 6 (L) 7 - 25 mg/dl    Creatinine 0.7 0.6 - 1.3 mg/dl    GFR est AA >60.0      GFR est non-AA >60      Calcium 8.9 8.5 - 10.1 mg/dl    Anion gap 6 5 - 15 mmol/L   MAGNESIUM    Collection Time: 12/26/17  4:40 AM   Result Value Ref Range    Magnesium 1.4 (L) 1.6 - 2.6 mg/dl   LIPID PANEL    Collection Time: 12/26/17  4:40 AM   Result Value Ref Range    Cholesterol, total 174 140 - 199 mg/dl    HDL Cholesterol 57 40 - 96 mg/dl    Triglyceride 145 29 - 150 mg/dl    LDL, calculated 88 0 - 130 mg/dl    CHOL/HDL Ratio 3.1 0.0 - 4.4 Ratio   HEMOGLOBIN A1C W/O EAG    Collection Time: 12/26/17  4:40 AM   Result Value Ref Range    Hemoglobin A1c 7.0 (H) 4.2 - 6.3 %       XR Results:  Results from Hospital Encounter encounter on 12/24/17   XR CHEST PA LAT    Narrative Chest    Indication: hx of GVHD with N/V/D.      Comparison: 05/20/2017    Findings:    Frontal and lateral views of the chest demonstrate that the cardiac silhouette  is not enlarged.  There is no focal infiltrate, pleural effusion, vascular  congestion, or pneumothorax.  Left PICC with tip overlying the distal superior  vena cava.      Impression Impression:     No acute cardiopulmonary disease.         CT Results:  Results from Hospital Encounter encounter on 12/24/17   CT HEAD WO CONT    Narrative DICOM format image data is available to non-affiliated external healthcare  facilities or entities on a secure, media free, reciprocally searchable basis  with patient authorization for 12 months following the date of the study.    Clinical history: Diplopia    EXAMINATION:  Noncontrast CT scan of the head 12/24/2017. 4 mm axial scanning is performed from  the skull base to the vertex. Coronal and sagittal reconstruction imaging has  been obtained.    Correlation: None    FINDINGS:  Visualized paranasal sinuses and mastoid air cells are clear. No acute  intracranial hemorrhage, mass or infarction.       Impression IMPRESSION:  Normal unenhanced CT scan of the head.    Initial interpretation: [facility]         MRI Results:  No results found for this or any previous visit.    Nuclear Medicine Results:  No results found for this or any previous visit.    [place] Results:  No results found for this or any previous visit.    IR Results:  No results found for this or any previous visit.    VAS/US Results:  Results from Hospital Encounter encounter on 04/25/17   DUPLEX LOWER EXT VENOUS BILAT    Narrative  Study ID: [facility]                                            [address] [place],                                         [place]                                  Lower Extremity Venous Report    Name: MARTIZA, SPETH            Study Date: 04/25/2017 08:46 AM  MRN: [id number]                     Patient Location: CD06  DOB: 1960-12-16                  Age: 23 yrs  Gender: Female                   Account #: [id number]  Reason For Study: Elevated D-dimer  Ordering Physician: Meredeth Ide  Performed By: Wannetta Sender    Interpretation Summary  No evidence of acute superficial or deep vein thrombosis noted in either  lower extremity .    _____________________________________________________________________________  Verneda Skill  Complete bilateral venous duplex performed. ICD 10: R78.87.    HISTORY/SYMPTOMS  Cancer. Elevated d-dimer.    RIGHT LEG  The right common femoral, femoral, popliteal, posterior tibial and peroneal  veins  were examined with duplex ultrasound. The deep veins were patent and   compressible with no evidence of intraluminal thrombus. Spontaneous, phasic  venous flow with normal augmentation was noted throughout the right leg.  RIGHT SAPHENOUS VEINS  Compression of the right proximal greater saphenous vein is complete.  Spectral Doppler exam demonstrates normal and spontaneous venous flow in the  right proximal greater saphenous vein.    LEFT LEG  The left common femoral, femoral, popliteal, posterior tibial and peroneal  veins were examined with duplex ultrasound. The deep veins were patent and  compressible with no evidence of intraluminal thrombus. Spontaneous, phasic  venous flow with normal augmentation was noted throughout the left leg.    LEFT LEG SAPHENOUS VEINS  Compression of the left proximal greater saphenous vein is complete. Spectral  Doppler exam demonstrates normal and spontaneous venous flow in the left  proximal greater saphenous vein.      Electronically signed byDr. Judithe Modest, M.D   04/25/2017 05:31 PM           Total Clinical time 25 minutes, including examining patient, review of current and past medical history in chart (labs, imaging, notes) and extensive discussion with family.       Sundra Aland, MD  Hospitalist  [facility]  December 26, 2017  8:35 AM

## 2017-12-26 NOTE — Consults (Signed)
[place] REPORT  NAME:  [name] [name], [name] [name]  SEX:   F  ADMIT: 12/24/2017  DATE OF CONSULT: 12/26/2017  REFERRING PHYSICIAN:    DOB: 1961-04-28  MR#    [id number]  ROOM:  [id number]  ACCT#  [id number]    cc: Sundra Aland MD    ATTENDING PHYSICIAN REQUESTING CONSULTATION:  Dr. Josie Saunders.    PATIENT LOCATION:  [facility].    REASON FOR CONSULTATION:  Positive blood cultures immunocompromised patient.    IMPRESSION:  1.  Positive blood cultures, gram-positive cocci in clusters, 2 of 2 sets from 08/14 -- labs indicates from peripheral, patient does not recall but does have a PICC line.  She apparently refused to start IV vancomycin and meropenem ordered yesterday.  In addition to her PICC line with CNS symptoms, agree with need to rule out a CVA, possibly embolic from line or true endovascular such as endocarditis.  Agree also with Dr. Edd Arbour that we will need LP if no CVA found.  2.  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  The chart indicates treaters there   Noemi Chapel [phone] or [phone], and Dr. Hart Carwin [phone].  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  It appears she is also on Bactrim 3 times a week and acyclovir prophylaxis.  She presents with pancytopenia, chronic thrombocytopenia, but is not neutropenic with ANC yesterday of 1800.  3.  Left numbness, weakness, nausea and vision change with persistent diplopia beginning Monday, 08/12 -- CT head negative.  Further workup in progress.  4.  Diabetes mellitus on insulin.  5.  Hypertension.  6.  PENICILLIN ALLERGY.    RECOMMENDATIONS:  1.  Repeat blood cultures, line and peripheral, now and label site.  2.  Discussed with the patient.  She now agrees to start antibiotics pending more information.   3.  Discussed with the microbiology labs with requested a PCR on gram-positive cocci in clusters for Staphylococcus aureus and MRSA per routine.  4.  Await MRI of brain, echocardiogram and neurology consult, which I understand is in progress.  5.  Agree with Dr. Edd Arbour of oncology would pursue LP after platelets if workup negative for CVA.  Would include in CSF studies, AFB fungal, Niger ink and cryptococcal as well as routine cultures along with other studies as per oncology and neurology.  6.  Discussed with Dr. Josie Saunders.    Thank you for this consultation.  [facility] will follow with you.    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  The patient apparently had last intrathecal chemotherapy 1 month ago.  She tells me her PICC line has been in place since April.  Approximately 3 days ago, she developed nausea, vomiting, some sweating but also left arm numbness and weakness and vision change.  The symptoms improved somewhat with glucose but the diplopia persisted and she presented to the emergency room 8/14.  Blood cultures were drawn.  She was admitted by Dr. Morrie Sheldon who noted a negative echocardiogram, consulted neurology and oncology.  Her blood cultures were positive for gram-positive cocci in  clusters and he ordered vancomycin and meropenem.  The patient apparently declined antibiotics and an outside physician was contacted who recommended repeat blood cultures.  Today, infectious disease consultation was requested.  The patient is seen after MRI and echocardiogram.  She received Ativan apparently and is very drowsy now.  She does report some headache but no photophobia or neck pain or stiffness  but persistent diplopia.  She also had some abdominal discomfort but no shaking chills or rigors.    PAST MEDICAL HISTORY:  Medical illnesses:  Diabetes mellitus, AML, GVH, hypertension.    PAST SURGICAL HISTORY:  Left arm PICC April.    ALLERGIES:  PENICILLIN WITH ITCHING, RASH REPORTED.    MEDICATIONS:  Vancomycin, acyclovir, Entocort, vitamin D, insulin, meropenem which patient has not yet started, tacrolimus, Bactrim and vancomycin.      FAMILY HISTORY:  Noncontributory.    SOCIAL HISTORY:  The patient apparently lives at home.    REVIEW OF SYSTEMS:  Limited by patient's current sedation.    HEENT:  She reports some headache, no photophobia, no mouth ulcerations.  PULMONARY:  The patient denies shortness of breath or cough.  CARDIOVASCULAR:  The patient denies chest pain.  The patient denies history of murmur.  GASTROINTESTINAL:  The patient had some nausea and vomiting, no diarrhea, no cramping.  GENITOURINARY:  The patient denies dysuria.  NEUROMUSCULOSKELETAL:  The patient denies new rash or joint pain.    PHYSICAL EXAMINATION:  GENERAL:  The patient is a sedated woman who appears stated age, lying in bed in no acute distress.  VITAL SIGNS:  Temperature max 98.8, temperature 98.6, blood pressure 106/67, pulse 87, respiratory rate 14, O2 sat 100%.  HEENT:  Sclerae anicteric.  Conjunctivae pink.  Fundi not visualized.  Oropharynx with moist oral mucosa without ulcerations.  NECK:  Supple, without lymphadenopathy or thyromegaly.  CHEST:  Clear to auscultation and percussion.  CARDIOVASCULAR:  Regular rate and rhythm without rub, murmur or gallop.  ABDOMEN:  Soft and nontender, without hepatosplenomegaly, masses, CVA or suprapubic tenderness.  EXTREMITIES:  No clubbing, cyanosis or edema.  NEUROLOGIC:  The patient is easily aroused but drifts back to sleep.    LABORATORY DATA:  Sodium 140, potassium 4.7, chloride 108, bicarbonate 27, glucose 207, BUN  6, creatinine 0.7, AST 16, ALT 29, alkaline phosphatase 96, bilirubin 0.4, total protein 5.5, albumin 3.3, ESR 45.  Hemoglobin A1c is 7, white count 3600, hematocrit 29, platelet count 53.  Differential yesterday 63 polys, 25 lymphs, 9 monos, 1 eosinophil.  08/14 blood cultures times 2 gram-positive cocci in clusters times 2, PCR requested.  Urine culture no growth to date.  CT of head normal 08/14.  Chest x-ray showed no acute disease with PICC in place.  Urinalysis showed 1 to 4 white cells, occasional bacteria.      ___________________  Lenna Sciara. Lovett Calender MD  Dictated By:.   [place]  D:12/26/2017 12:10:35  T: 12/26/2017 13:24:58  [id number]

## 2017-12-26 NOTE — Progress Notes (Addendum)
Page Sent 1333     PAGER ID: [phone]   MESSAGE: [phone]. [name] [name]. Pt. agitated, stating she is not neutropenic. Demanding to have certain foods that she cannot have on neutropenic precautions? Rebekah, [place]      -->Patient is refusing all PO medications until she gets her trey.     1339: MD ordered regular diet and to d/c neutropenic precautions. Patient on phone with patient advocate.     1342: RN called dietary to inform them patient's diet had been changed.     [place] Page Sent      PAGER ID: [phone]   MESSAGE: [phone]. [name] [name].Pt. is requesting to have Cymbalta, protonix, magnesium ox and MG-PLUS ordered. RN updated med list. Pt. med list however states the MG-PLUS is discontinued. Patient states she still takes this. Thanks. Eugene Garnet, [place]    [place] Page Sent      PAGER ID: [phone]   MESSAGE: [phone]. [name] [name]. Pt. requesting for "someone who knows how to do it" insert her IV. IV she has now is infiltrated and she states we are not allowed to use PICC d/t her chemo. Thanks. Eugene Garnet, [place]    [place]   Page Sent      PAGER ID: [phone]   MESSAGE: [phone]. [name] [name]. IV poss. infiltrate. Pt. c/o pain every time RN flushes and no blood return. PICC cannot place any more IVs d/t pt. condition. Patient refusing to allow use of PICC. IV antibiotics cannot be administered. Eugene Garnet, [place]

## 2017-12-26 NOTE — Progress Notes (Signed)
Hematology / Oncology Progress Note      Patient: [name] [name]  Gender: female   MRN: [id number]    Date of Birth: 1960/12/06 Age: 57 y.o.   CSN: [id number]    LOS:  LOS: 0 days   Admit Date: 12/24/2017     PCP: Other, Phys, MD    Assessment:   Active Problems:    TIA (transient ischemic attack) (12/25/2017)        Plan:   [place] chromosome positive AML.  Status post high-dose chemotherapy and stem cell transplant.  Currently on to tasiigna.  History of prior meningeal  leukemia.  Currently on intrathecal chemotherapy being given in cancer treatment centers of [name] in [place].  Last dose was about a month or so ago.  ? TIA/stroke.  Continue treatment as per medicine and neurology.  If work-up is negative she will need a spinal tap to rule out meningeal leukemia.  Thrombocytopenia chronic.  If she needs LP she will need platelet transfusion prior to the spinal tap.    Subjective:   Admitted for diplopia and possible stroke.    Objective:     Visit Vitals  BP 106/67 (BP 1 Location: Right arm, BP Patient Position: Supine)   Pulse 87   Temp 98.6 ??F (37 ??C)   Resp 14   Ht 5' (1.524 m)   Wt 66.1 kg (145 lb 11.6 oz)   SpO2 100%   BMI 22.16 kg/m??             Intake/Output Summary (Last 24 hours) at 12/26/2017 0815  Last data filed at 12/26/2017 0616  Gross per 24 hour   Intake 720 ml   Output ???   Net 720 ml       Review of Systems:   Constitutional: negative for fevers, chills, sweats. some fatigue  Eyes: negative for visual disturbance, redness and icterus  Ears, Nose, Mouth, Throat, and Face: negative for tinnitus, epistaxis, sore mouth and hoarseness  Respiratory: negative for cough, sputum, hemoptysis, pleurisy/chest pain or wheezing  Cardiovascular: negative for chest pain, chest pressure/discomfort, palpitations, irregular heart beats, syncope, paroxysmal nocturnal dyspnea  Gastrointestinal: negative for reflux symptoms, vomiting, change in bowel  habits, melena, diarrhea, constipation and abdominal pain.  Has some nausea.  Genitourinary:negative for dysuria, nocturia, urinary incontinence, hesitancy and hematuria  Integument: negative for rash, skin lesion(s) and pruritus  Hematologic/Lymphatic: negative for easy bruising, bleeding and lymphadenopathy  Musculoskeletal:negative for myalgias, arthralgias and bone pain  Neurological: negative for seizures, paresthesia and weakness.  Has headaches dizziness and also diplopia.  No blind spots no blurred vision.    Physical Assessment:   Constitutional: Alert, oriented, not in distress  Eyes: PERRLA, anicteric, no redness  Ears, nose, mouth, throat, and face: no palpable Lymph nodes, no mucositis, no thrush.  Respiratory: symmetrical expansion, no rales, no rhonchi, no wheezing.  Cardiovascular: S1S2, no pathologic murmur, no rub.  Gastrointestinal: soft, benign, non tender, no HSM, normal bowel sounds, no mass.  Integument: no rash, no petechiae, no ecchymosis.  Musculoskeletal: no edema, no cyanosis, no clubbing.  Neurological: intact, cranial nerves, no focal motor or sensory deficits.    Diagnostic test:     Basic Metabolic Profile   Recent Labs     12/26/17  0440 12/24/17  2256 12/24/17  2230   NA 140  --  139   K 4.7  --  4.4   CL 108*  --  105   CO2 27  --  25  BUN 6*  --  10   CREA 0.7 0.7 0.8   GLU 207*  --  239*   CA 8.9  --  8.4*   MG 1.4*  --  2.2        CBC w/Diff    Recent Labs     12/26/17  0440 12/25/17  0010   WBC 3.6* 2.9*   HGB 9.8* 10.4*   HCT 29.0* 29.5*   PLT 53* 55*    Recent Labs     12/25/17  0010   GRANS 63.3        CHEMISTRY   Lab Results   Component Value Date    ALB 3.3 (L) 12/24/2017    TP 5.5 (L) 12/24/2017    CBIL 0.1 12/24/2017    TBILI 0.4 12/24/2017    ALT 29 12/24/2017    SGOT 16 12/24/2017    AP 96 12/24/2017    LPSE 41 (L) 12/24/2017         Coagulation   Lab Results   Component Value Date/Time    Prothrombin time 12.1 12/25/2017 03:18 AM     Prothrombin time 14.1 (H) 05/13/2017 08:33 AM    Prothrombin time 12.5 04/25/2017 07:04 AM    INR 1.0 12/25/2017 03:18 AM    INR 1.2 (H) 05/13/2017 08:33 AM    INR 1.1 04/25/2017 07:04 AM        Current Medications:     Current Facility-Administered Medications:   ???  acyclovir (ZOVIRAX) tablet 400 mg, 400 mg, Oral, BID, Einar Grad, MD, 400 mg at 12/25/17 2036  ???  cholecalciferol (VITAMIN D3) tablet 5,000 Units, 5,000 Units, Oral, DAILY, Einar Grad, MD, 5,000 Units at 12/25/17 1206  ???  dextrose (D50) infusion 5-25 g, 10-50 mL, IntraVENous, PRN, Einar Grad, MD  ???  glucagon (GLUCAGEN) injection 1 mg, 1 mg, IntraMUSCular, PRN, Einar Grad, MD  ???  insulin glargine (LANTUS) injection 1-100 Units, 1-100 Units, SubCUTAneous, QHS, Einar Grad, MD, 17 Units at 12/25/17 2129  ???  insulin lispro (HUMALOG) injection 1-100 Units, 1-100 Units, SubCUTAneous, AC&HS, Einar Grad, MD, Stopped at 12/25/17 1203  ???  insulin lispro (HUMALOG) injection 1-100 Units, 1-100 Units, SubCUTAneous, PRN, Einar Grad, MD, 1 Units at 12/25/17 2129  ???  oxyCODONE IR (ROXICODONE) tablet 10 mg, 10 mg, Oral, Q4H PRN, Einar Grad, MD  ???  potassium chloride (K-DUR, KLOR-CON) SR tablet 40 mEq, 40 mEq, Oral, BID, Einar Grad, MD, 40 mEq at 12/25/17 2035  ???  tacrolimus (PROGRAF) capsule 4 mg, 4 mg, Oral, BID, Einar Grad, MD, 4 mg at 12/25/17 2035  ???  prochlorperazine (COMPAZINE) tablet 10 mg, 10 mg, Oral, Q4H PRN, Einar Grad, MD, 10 mg at 12/25/17 1228  ???  zolpidem (AMBIEN) tablet 5 mg, 5 mg, Oral, QHS PRN, Einar Grad, MD, 5 mg at 12/25/17 2035  ???  trimethoprim-sulfamethoxazole (BACTRIM DS, SEPTRA DS) 160-800 mg per tablet 1 Tab, 1 Tab, Oral, Q MON, WED & Gara Kroner, MD  ???  naloxone (NARCAN) injection 0.1 mg, 0.1 mg, IntraVENous, PRN, Einar Grad, MD  ???  acetaminophen (TYLENOL) tablet 650 mg, 650 mg, Oral, Q4H PRN **OR** acetaminophen (TYLENOL) solution 650 mg, 650 mg, Oral, Q4H PRN **OR**  acetaminophen (TYLENOL) suppository 650 mg, 650 mg, Rectal, Q4H PRN, Einar Grad, MD  ???  nilotinib (TASIGNA) capsule cap 200 mg  (Patient Supplied), 200 mg, Oral, BID, Einar Grad, MD, 200 mg at 12/25/17 2100  ???  LORazepam (ATIVAN) tablet 1 mg, 1  mg, Oral, ON CALL, Einar Grad, MD  ???  budesonide (ENTOCORT EC) capsule 6 mg, 6 mg, Oral, TID, Dawson Bills, Muh[place], MD, 6 mg at 12/25/17 2130  ???  vancomycin (VANCOCIN) 1000 mg in NS 250 ml infusion, 1,000 mg, IntraVENous, Q12H, Einar Grad, MD  ???  [START ON 12/27/2017] Vancomycin trough due, , Other, ONCE, Einar Grad, MD  ???  *Pharmacy dosing vancomycin, 1 Each, Other, Rx Dosing/Monitoring, Einar Grad, MD  ???  meropenem (MERREM) 500 mg in 0.9% sodium chloride (MBP/ADV) 50 mL MBP, 0.5 g, IntraVENous, Q8H, Einar Grad, MD        [name], MD   [facility]  Office [phone]

## 2017-12-26 NOTE — Progress Notes (Addendum)
1922: RN took patient's blood sugar. RN educated patient to notify oncoming RN when she is finished eating so she can receive her dinner insulin. Patient stated she did not want to receive two different shots (one to cover meal and one to cover correction for her blood sugar). Oncoming RN made aware.

## 2017-12-26 NOTE — Progress Notes (Signed)
Prelim ID Consult (see Dictation [phone])    Patient: [name] [name] CSN: [id number]     Date of Birth: May 02, 1961  Age: 57 y.o.  Sex: female        Current abx Prior abx   Prophylactic Bactrim tiw, ACV vanco/meropenem 8/16-0       Assessment:     Pos bctx's GPC cl 2 of 2 8/14- lab indicates from peripheral, pt not recall   -refused iv vanco/meropenem ordered yesterday  -has picc, with CNS sx need to r/o CVA poss embolic, line infection or true endovascular; agree with Dr. Edd Arbour will need LP if no CVA found   AML sp chemo/stem cell xplant  -h/o meningeal leukemia on IT chemo in [place] in [place], last ~1 mo ago per [facility]'s note -- Lennie Muckle [phone] or [phone]; Dr. Hart Carwin [phone]  -on tassigra comp GVH skin, esophagus   -bactrim tiw, acyclovir prophylaxis  -pancytopenia ch thrombocytopenia, not neutropenic (ANC 8/15 1800)    L numbness, weakness n/v vision change diplopia began Monday 8/12- CTH neg   DM on insulin    HTN             PCN allergy       Rec:   ->repeat bctx's line and peripheral now  And label site  ->d/w patient now agree to start abx  ->d/w lab to do PCR on GPC cl   ->await MRI  ->await echo   ->Neurol to see  ->agree with Dr. Edd Arbour would pursue LP p plt if w/u neg for CVA.   Include afb fungal II CrAG routine cultures along with other studies per Onc/Neurology   -d/w Dr. Josie Saunders     MICROBIOLOGY:   8/14 bctx x 2 GPC cl   uctx ngtd    LINES AND CATHETERS:   PICC    Thanks -- [facility] will follow with you    Arrion Burruel. Lovett Calender, MD  [facility]  December 26, 2017  [phone]

## 2017-12-26 NOTE — Progress Notes (Signed)
Problem: Falls - Risk of  Goal: *Absence of Falls  Description  Document Schmid Fall Risk and appropriate interventions in the flowsheet.  Outcome: Progressing Towards Goal  Note:   Fall Risk Interventions:            Medication Interventions: Patient to call before getting OOB

## 2017-12-26 NOTE — Progress Notes (Signed)
Patient admitted on 12/24/2017 from home with   Chief Complaint   Patient presents with   ??? Nausea   ??? Double Vision   ??? Diarrhea        The patient has been admitted to the hospital 2 times in the past 12 months.    Tentative dc plan:    home    Anticipated Discharge Date:   12/28/2017    Would you like any one involved in your discharge plan Brother and mother.    PCP: Declined TCC referral     Face sheet information, address, contact info and insurance verified  yes    [facility]  Any issues getting medications  no    Home Environment:    Lives at [address]  [place] [address]    @HOMEPHONE @.     Prior to admission open services:     na    Extended Emergency Contact Information  Primary Emergency Contact: [name] [name]  Mobile Phone: [phone]  Relation: Brother      Transportation:     Family will transport home    Therapy Recommendations: na    Case Management Assessment    ABUSE/NEGLECT SCREENING   Physical Abuse/Neglect: Denies   Sexual Abuse: Denies   Sexual Abuse: Denies   Other Abuse/Issues: Denies          [place]       PCP Verified by CM: No(Declined TCC referral)                                                   Current Support Network: [place]   Reason for Referral: DCP Rounds   History Provided By: Patient   Patient Orientation: Alert and Oriented   Cognition: Alert   Support System Response: Concerned   Previous Living Arrangement: Lives with Family Independent       Prior Functional Level: Independent in ADLs/IADLs   Current Functional Level: Independent in ADLs/IADLs   Primary Language: English   Can patient return to prior living arrangement: Yes   Ability to make needs known:: Good   Family able to assist with home care needs:: Yes                       Confirm Follow Up Transport: Family       Plan discussed with Pt/Family/Caregiver: Yes          DISCHARGE LOCATION   Discharge Placement: Home

## 2017-12-26 NOTE — Progress Notes (Signed)
Neurology Progress Note    Patient ID:  [name] [name]  [id number]  57 y.o.  05-Aug-1960    Subjective:   HISTORY:    57 years old female admitted to the hospital with complaint of 3 to 4 days history of blurred vision double vision, left face and left arm numbness and unsteady gait. The symptoms had improved immediately after taking glucose except had continued having double vision.     INTERVAL CHANGE/ROS:  Since admission she feels better she did not have any further episode of facial left arm numbness or unsteady gait. She continues to have double vision change when she looks to either side up or down.  She also complains of mild dull aching pressure type headache over her forehead but denies vomiting or dizziness. She denies any symptoms of speech disturbances confusion numbness or weakness in the arms or legs or significant gait or sphincter disturbances. She denies droopiness of eyelids, difficulty chewing, swallowing or breathing.    Physical examination:  HEENT normocephalic atraumatic  General appearance well-built well-nourished  Neck supple  Cardiovascular rate and rhythm regular  Dermatology: no skin rashes seen   Musculoskeletal no joint deformities seen  Psychiatric mood and affect appropriate    Neurological examination:  She is awake alert and oriented x3 her speech is clear comprehension is intact cranial nerves II through XII are intact field of vision is intact bilaterally pupils both equal and reacting to light extraocular movements are full no ptosis or nystagmus is seen no facial weakness is the remainder of cranial nerves are intact she has normal muscle tone and normal muscle strength in all 4 extremities no arm drift is seen no sensory deficit is elicited deep tendon reflexes 1+ bilateral symmetrical both plantar reflexes are flexors gait not tested    Data review:  MRI brain without contrast on 12/26/2017 has revealed nonspecific white matter hypo   Intensities from chronic microvascular ischemic changes other differentials include graft versus host, inflammatory versus infectious, drug toxicity demyelinating process trauma chronic headaches or vasculitis.  I personally reviewed MRI films and agree with the interpretation  CTA of neck and head has not revealed any significant large vessel stenosis on either side  Sed rate 45 mm/h; BMP normal; lipid panel triglycerides 145 cholesterol 174 HDL 57 LDL 88 CPK 24 hemoglobin A1c 7.0 RA test negative ANA pending myasthenia gravis panel pending musk antibodies pending    Impression:  3 to 4 days history of blurred vision double vision left face and left arm numbness and unsteady gait of unclear etiology objective neurological examination remains normal at present MRI brain has not revealed any acute brainstem ischemic infarct or mass lesion with given history of leukemia and bone marrow transplantation possibility of meningeal carcinomatosis needs to be ruled out  Possible etiology for a double vision will include ocular myasthenia gravis or typical inflammatory radicular neuritis likely Idamae Schuller syndrome not be excluded  Muscle contraction headaches    Plan:  Discussed the findings of lab work, MRI of brain and CTA of neck and head with her  Check pending lab reports  She is seen by Dr. Kellie Moor from infectious disease for positive blood cultures in immunocompromised patient.  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 Edd Arbour oncologist  Dr. Harriette Ohara will take over neurology services from this afternoon and will continue to follow her      Objective:      Current Facility-Administered Medications   Medication Dose Route Frequency   ??? sodium chloride (NS) flush 10 mL  10 mL InterCATHeter DAILY   ??? sodium chloride (NS) flush 10-20 mL  10-20 mL InterCATHeter PRN   ??? heparin (porcine) pf 50 Units  50 Units InterCATHeter PRN   ??? heparin (porcine) pf 50 Units  50 Units InterCATHeter DAILY   ??? iopamidol (ISOVUE-370) 76 % injection 80 mL  80 mL IntraVENous RAD ONCE   ??? DULoxetine (CYMBALTA) capsule 20 mg  20 mg Oral DAILY   ??? magnesium oxide (MAG-OX) tablet 400 mg  400 mg Oral BID   ??? pantoprazole (PROTONIX) tablet 40 mg  40 mg Oral DAILY   ??? acyclovir (ZOVIRAX) tablet 400 mg  400 mg Oral BID   ??? cholecalciferol (VITAMIN D3) tablet 5,000 Units  5,000 Units Oral DAILY   ??? dextrose (D50) infusion 5-25 g  10-50 mL IntraVENous PRN   ??? glucagon (GLUCAGEN) injection 1 mg  1 mg IntraMUSCular PRN   ??? insulin glargine (LANTUS) injection 1-100 Units  1-100 Units SubCUTAneous QHS   ??? insulin lispro (HUMALOG) injection 1-100 Units  1-100 Units SubCUTAneous AC&HS   ??? insulin lispro (HUMALOG) injection 1-100 Units  1-100 Units SubCUTAneous PRN   ??? oxyCODONE IR (ROXICODONE) tablet 10 mg  10 mg Oral Q4H PRN   ??? potassium chloride (K-DUR, KLOR-CON) SR tablet 40 mEq  40 mEq Oral BID   ??? tacrolimus (PROGRAF) capsule 4 mg  4 mg Oral BID   ??? prochlorperazine (COMPAZINE) tablet 10 mg  10 mg Oral Q4H PRN   ??? zolpidem (AMBIEN) tablet 5 mg  5 mg Oral QHS PRN   ??? trimethoprim-sulfamethoxazole (BACTRIM DS, SEPTRA DS) 160-800 mg per tablet 1 Tab  1 Tab Oral Q MON, WED & FRI   ??? naloxone (NARCAN) injection 0.1 mg  0.1 mg IntraVENous PRN   ??? acetaminophen (TYLENOL) tablet 650 mg  650 mg Oral Q4H PRN    Or   ??? acetaminophen (TYLENOL) solution 650 mg  650 mg Oral Q4H PRN    Or   ??? acetaminophen (TYLENOL) suppository 650 mg  650 mg Rectal Q4H PRN   ??? nilotinib (TASIGNA) capsule cap 200 mg  (Patient Supplied)  200 mg Oral BID   ??? LORazepam (ATIVAN) tablet 1 mg  1 mg Oral ON CALL    ??? budesonide (ENTOCORT EC) capsule 6 mg  6 mg Oral TID   ??? vancomycin (VANCOCIN) 1000 mg in NS 250 ml infusion  1,000 mg IntraVENous Q12H   ??? *Pharmacy dosing vancomycin  1 Each Other Rx Dosing/Monitoring   ??? meropenem (MERREM) 500 mg in 0.9% sodium chloride (MBP/ADV) 50 mL MBP  0.5 g IntraVENous Q8H        Patient Vitals for the past 8 hrs:   BP Temp Pulse Resp SpO2   12/26/17 1600 132/79 98.6 ??F (37 ??C) 94 13 100 %   12/26/17 1109 138/76 97.7 ??F (36.5 ??C) 94 14 100 %       Visit Vitals  BP 132/79 (BP 1 Location: Right arm, BP Patient Position: Supine)   Pulse 94   Temp 98.6 ??F (37 ??C)   Resp 13   Ht 5' (1.524 m)   Wt 66.1 kg (145 lb 11.6 oz)  SpO2 100%   BMI 22.16 kg/m??       Lab Review   Recent Results (from the past 24 hour(s))   SED RATE (ESR)    Collection Time: 12/25/17  5:25 PM   Result Value Ref Range    Sed rate (ESR) 45 (H) 0 - 30 mm/Hr   RHEUMATOID FACTOR, QL    Collection Time: 12/25/17  5:25 PM   Result Value Ref Range    Rheumatoid factor <10 0 - 14 IU/ml   CK    Collection Time: 12/25/17  5:25 PM   Result Value Ref Range    CK 24 (L) 26 - 192 U/L   GLUCOSE, POC    Collection Time: 12/25/17  7:44 PM   Result Value Ref Range    Glucose (POC) 213 (H) 65 - 105 mg/dL   CBC W/O DIFF    Collection Time: 12/26/17  4:40 AM   Result Value Ref Range    WBC 3.6 (L) 4.0 - 11.0 1000/mm3    RBC 2.83 (L) 3.60 - 5.20 M/uL    HGB 9.8 (L) 13.0 - 17.2 gm/dl    HCT 29.0 (L) 37.0 - 50.0 %    MCV 102.5 (H) 80.0 - 98.0 fL    MCH 34.6 25.4 - 34.6 pg    MCHC 33.8 30.0 - 36.0 gm/dl    PLATELET 53 (L) 140 - 450 1000/mm3    MPV 9.3 6.0 - 10.0 fL    RDW-SD 56.6 (H) 36.4 - 93.7     METABOLIC PANEL, BASIC    Collection Time: 12/26/17  4:40 AM   Result Value Ref Range    Sodium 140 136 - 145 mEq/L    Potassium 4.7 3.5 - 5.1 mEq/L    Chloride 108 (H) 98 - 107 mEq/L    CO2 27 21 - 32 mEq/L    Glucose 207 (H) 74 - 106 mg/dl    BUN 6 (L) 7 - 25 mg/dl    Creatinine 0.7 0.6 - 1.3 mg/dl    GFR est AA >60.0      GFR est non-AA >60       Calcium 8.9 8.5 - 10.1 mg/dl    Anion gap 6 5 - 15 mmol/L   MAGNESIUM    Collection Time: 12/26/17  4:40 AM   Result Value Ref Range    Magnesium 1.4 (L) 1.6 - 2.6 mg/dl   LIPID PANEL    Collection Time: 12/26/17  4:40 AM   Result Value Ref Range    Cholesterol, total 174 140 - 199 mg/dl    HDL Cholesterol 57 40 - 96 mg/dl    Triglyceride 145 29 - 150 mg/dl    LDL, calculated 88 0 - 130 mg/dl    CHOL/HDL Ratio 3.1 0.0 - 4.4 Ratio   HEMOGLOBIN A1C W/O EAG    Collection Time: 12/26/17  4:40 AM   Result Value Ref Range    Hemoglobin A1c 7.0 (H) 4.2 - 6.3 %   GLUCOSE, POC    Collection Time: 12/26/17 11:10 AM   Result Value Ref Range    Glucose (POC) 180 (H) 65 - 105 mg/dL   GLUCOSE, POC    Collection Time: 12/26/17  2:43 PM   Result Value Ref Range    Glucose (POC) 198 (H) 65 - 105 mg/dL     Results from Hospital Encounter encounter on 12/24/17   MRI BRAIN WO CONT    Narrative EXAMINATION: MRI BRAIN WO CONT  CLINICAL INDICATION: Diplopia; TIA bone marrow transplant. Blurred vision     COMPARISON:  None.    TECHNIQUE: T1 and T2-weighted sequences of the brain were obtained in multiple  planes without contrast.    FINDINGS:   No restricted diffusion.  No masses, no midline shift. No hydrocephalus. No extra-axial fluid collections.  Visualized portions of the orbits grossly unremarkable.  Mastoid air cells well aerated. Visualized portions of paranasal sinuses  unremarkable.    Intracranial flow voids of great vessels, grossly intact. Diffuse low signal  marrow.  Age-appropriate atrophy. No susceptibility artifact on gradient echo imaging to  suggest hemorrhage.  Cerebellar tonsil extends into the foramen magnum 2 mm or so. Pituitary gland  normal. Orbits appear normal. No compression of optic chiasm extraocular muscles  are normal in size. No orbital masses. Several foci of T2 prolongation in corona  radiata and centrum semiovale. Largest in the right frontal lobe measures 8 mm   and in the left parietal lobe millimeters 10 mm. Several additional smaller  lesions are noted in the frontal and parietal lobes.       Impression IMPRESSION:  1.   Nonspecific white matter hyperintensities likely due to chronic  microvascular ischemic changes. Iatrogenic drug toxicity, graft-versus-host,  inflammatory/infectious, hypertension, demyelinating process, trauma, chronic  headaches, sequelae of vasculitis are other considerations.  2. Low-lying cerebellar tonsils.  3. No orbital masses. No infarction.         Mri Brain Wo Cont    Result Date: 12/26/2017  EXAMINATION: MRI BRAIN WO CONT CLINICAL INDICATION: Diplopia; TIA bone marrow transplant. Blurred vision COMPARISON:  None. TECHNIQUE: T1 and T2-weighted sequences of the brain were obtained in multiple planes without contrast. FINDINGS: No restricted diffusion. No masses, no midline shift. No hydrocephalus. No extra-axial fluid collections. Visualized portions of the orbits grossly unremarkable. Mastoid air cells well aerated. Visualized portions of paranasal sinuses unremarkable. Intracranial flow voids of great vessels, grossly intact. Diffuse low signal marrow. Age-appropriate atrophy. No susceptibility artifact on gradient echo imaging to suggest hemorrhage. Cerebellar tonsil extends into the foramen magnum 2 mm or so. Pituitary gland normal. Orbits appear normal. No compression of optic chiasm extraocular muscles are normal in size. No orbital masses. Several foci of T2 prolongation in corona radiata and centrum semiovale. Largest in the right frontal lobe measures 8 mm and in the left parietal lobe millimeters 10 mm. Several additional smaller lesions are noted in the frontal and parietal lobes.     IMPRESSION: 1.   Nonspecific white matter hyperintensities likely due to chronic microvascular ischemic changes. Iatrogenic drug toxicity, graft-versus-host, inflammatory/infectious, hypertension, demyelinating  process, trauma, chronic headaches, sequelae of vasculitis are other considerations. 2. Low-lying cerebellar tonsils. 3. No orbital masses. No infarction.     Cta Head    Result Date: 12/26/2017  CTA OF THE HEAD INDICATION: Nausea, sweating, vomiting, diarrhea COMPARISON: MRI 12/26/2017, CT 12/24/2017 TECHNIQUE: Noncontrast CT the head performed followed by CTA of the head with intravenous contrast. Multiplanar two-dimensional and three-dimensional maximum intensity projection reformats performed and reviewed. DICOM format image data is available to non-affiliated external healthcare facilities or entities on a secure, media free, reciprocally searchable basis with patient authorization for 12 months following the date of the study. FINDINGS: Noncontrast CT of the head demonstrates no acute segmental infarct, intracranial hemorrhage, mass, midline shift, extra-axial fluid collection, or hydrocephalus. The vertebral arteries are codominant. There is no focal hemodynamically significant stenosis within the anterior or posterior circulation. The anterior and posterior communicating arteries appear  patent. There is no definitive aneurysm.     IMPRESSION: No focal hemodynamically significant stenosis.     Cta Neck    Result Date: 12/26/2017  CTA OF THE NECK WITH CONTRAST INDICATION: Speech difficulty, and balance COMPARISON: No relevant studies. TECHNIQUE: CTA of the neck was performed with nonionic intravenous contrast with multiplanar 2-dimensional and maximum intensity projection three-dimensional images. Stenosis was evaluated using the NASCET criteria. DICOM format image data is available to non-affiliated external healthcare facilities or entities on a secure, media free, reciprocally searchable basis with patient authorization for 12 months following the date of the study. FINDINGS: There is no hemodynamically significant stenosis within the carotid or vertebral  arteries. There is no evidence of dissection. CAROTID STENOSIS REFERENCE USING NASCET CRITERIA % Stenosis = (1 - narrowest diameter/diameter of distal artery) x100 Mild: < 50% stenosis Moderate: 50-69% stenosis. Severe: 70-94% stenosis. Near occlusion: 95-99% stenosis. Occluded: 100% stenosis     IMPRESSION: No focal hemodynamically significant stenosis.         Signed:  Clifton Custard, MD  12/26/2017  5:04 PM

## 2017-12-26 NOTE — Progress Notes (Signed)
Stroke rounds:    VTE Prophylaxis: Yes: SCD's    Antiplatelet: No    Statin if LDL Greater Than or Equal to100: No    BP Parameters: Notify MD for systolic blood pressure greater than 180 mmHg or less than 90 mmHg    Controlled With: None    Dysphagia Screen Completed: Yes: Pass  Dysphagia Screening  Vocal Quality/Secretions: Normal  History of Dysphagia: (!) Yes(states she has difficulty swallowing at times)  O2 Saturation: Normal  Alertness: Normal  Pre-Swallow Assessment Score: 1  Purees: No difficulty noted  Water by Cup: No difficulty noted  Water by Straw: No difficulty noted    Patient has PEG, NG Tube, Feeding Tube: No    Medication orders per above route: Yes    Nutrition Status: PO    NIH Stroke Scale Complete: Yes: 0    Frequency of Vital Signs: Every 4 hours     Frequency of Neuro Checks: Every 4 hours    Daily Education/Care Plan Updated: Yes    Darryl Lent, RN

## 2017-12-26 NOTE — Consults (Signed)
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  When her symptoms started on Monday with blurry vision, slurred speech and left arm numbness and weakness, she noted her blood sugar was around 70.  She contacted her oncologist at the [facility] in [place], who thought she may have hypoglycemia related symptoms and advised to increase glucose intake.  Her slurred speech and left arm weakness had gone away.  However, she continued to have blurred vision that gradually transitioned to double vision.  She was also having some unsteady gait.  She therefore presented to the emergency room and admitted for further management.    REVIEW OF SYSTEMS:  NEUROLOGY:  She also complains of dull, aching, pressure type headache over her forehead.  She continues to have double vision.  Looking on either gaze, up or down, the images are side by side.  She denies droopiness of her eyelids or ocular pain.  She denies any symptoms of speech disturbances like difficulty expressing herself or understanding others, numbness or weakness in her arms or legs, any significant gait or sphincter disturbances, blackout episode or seizure activity.  EYES:  Double vision as described.  ENT:  No dizziness.  CARDIOLOGY:  No chest pain.  RESPIRATORY:  No shortness of breath.  GASTROENTEROLOGY:  Nausea and vomiting as described.    GENITOURINARY:  History of diabetes.  HEMATOLOGY:  No bleeding disorder.  DERMATOLOGY:  No skin rash.   CONSTITUTIONAL:  No recent infection or trauma.  PSYCHIATRY:  No history of anxiety or depression.    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  She does not have any history of hypertension, hyperlipidemia, coronary artery disease, stroke or seizure disorder.    PAST SURGICAL HISTORY:  Bone marrow transplantation in April 2019.    PERSONAL AND SOCIAL HISTORY:  No smoking, alcohol or recreational drug use.    CURRENT MEDICATIONS:  Zovirax, Cymbalta, Lantus and Humalog insulin, Ativan, meropenem, Prograf, Bactrim DS, oxycodone and Compazine.    DRUG ALLERGIES:  PENICILLIN.    FAMILY HISTORY:  Noncontributory.    PHYSICAL EXAMINATION:  VITAL SIGNS:  Blood pressure 63, pulse 91, respirations 16, temperature 97.7 degrees Fahrenheit.  GENERAL APPEARANCE:  Well built, well nourished.  HEENT:  Normocephalic, atraumatic.  NECK:  Supple.  DERMATOLOGIC:  No skin rash is seen.  MUSCULOSKELETAL:  No joint deformity is seen.  PSYCHIATRY:  Mood and affect appropriate.    NEUROLOGICAL EXAMINATION:    She is awake, alert and oriented times 3.  Her speech is clear.  Her comprehension is intact.  No aphasia is elicited.  Cranial nerves II-XII are intact, both pupils equal and reacting to light.  Field of vision is intact bilaterally.  Extraocular movements are full.  No nystagmus is seen.  No ptosis is seen.  No facial weakness is seen.  The remainder of  cranial nerves are intact.  She had normal muscle tone and normal muscle strength in all 4 extremities.  No sensory deficit is elicited.  Deep tendon reflexes are 1+ bilaterally and symmetrical.  Both plantar reflexes are flexor.  Finger-to-nose and heel-to-shin test intact on both sides.  Gait not tested.    DATA REVIEW:  CBC:  WBC 2.9, hemoglobin 10.4, hematocrit 29.5, platelets 55.  Comprehensive metabolic panel normal except glucose 239.  Hemoglobin A1c  7.  Lipid profile:  Triglycerides 145, cholesterol 174, HDL 57, LDL 88.    CT scan of brain without contrast was obtained on 12/24/2017 that was normal.  No acute ischemic infarct, bleed or mass lesion was seen.    IMPRESSION:  1.  Acute onset of left face and left arm numbness, some unsteadiness of gait and double vision, etiology probably vascular in nature at the level of brainstem in the vertebrobasilar artery distribution.  However, peripheral etiology for her symptoms like ocular myasthenia gravis or atypical Idamae Schuller syndrome cannot be excluded.  1.  History of diabetes mellitus.  2.  History of acute myelocytic leukemia, status post bone marrow transplantation, followed by oncologist at [facility].    PLAN:  1.  Neuro checks and seizure precautions.  2.  I discussed with her about various possible etiology for symptoms.  3.  I will recommend obtaining MRI of brain without contrast to look for any evidence of acute ischemic infarct in the brainstem.  4.  I will recommend to obtain CT angiogram of neck and head for evaluation of intra- and extracranial circulation.  5.  I will recommend to check her blood for CPK, TSH, RA test, ANA, myasthenia gravis panel and MuSK antibody titer.  6.  Continue other medical management as per Dr. Morrie Sheldon, the hospitalist.    I once again thank you for requesting me to see the patient on neurological consultation for my neurological opinion and advice.      ___________________  Clifton Custard MD  Dictated By:.   MLT  D:12/26/2017 17:03:14  T: 12/26/2017 17:31:23  [id number]

## 2017-12-27 ENCOUNTER — Inpatient Hospital Stay: Admit: 2017-12-27 | Payer: BLUE CROSS/BLUE SHIELD | Primary: Internal Medicine

## 2017-12-27 LAB — GLUCOSE, POC
Glucose (POC): 224 mg/dL — ABNORMAL HIGH (ref 65–105)
Glucose (POC): 205 mg/dL — ABNORMAL HIGH (ref 65–105)
Glucose (POC): 162 mg/dL — ABNORMAL HIGH (ref 65–105)

## 2017-12-27 LAB — CULTURE, BLOOD

## 2017-12-27 LAB — TACROLIMUS, WHOLE BLOOD
TACROLIMUS,XTACR1: 5.9 mcg/L
Tacrolimus level: 5.9 mcg/L

## 2017-12-27 LAB — POCT GLUCOSE
POC Glucose: 224 mg/dL — ABNORMAL HIGH (ref 65–105)
POC Glucose: 205 mg/dL — ABNORMAL HIGH (ref 65–105)
POC Glucose: 162 mg/dL — ABNORMAL HIGH (ref 65–105)

## 2017-12-27 LAB — CULTURE, BLOOD 1

## 2017-12-27 MED ORDER — PHARMACY VANCOMYCIN NOTE
Freq: Once | Status: DC
Start: 2017-12-27 — End: 2017-12-28

## 2017-12-27 MED ORDER — LORAZEPAM 2 MG/ML IJ SOLN
2 mg/mL | Freq: Once | INTRAMUSCULAR | Status: AC
Start: 2017-12-27 — End: 2017-12-27
  Administered 2017-12-27: 17:00:00 via INTRAVENOUS

## 2017-12-27 MED ORDER — GADOBUTROL 1 MMOL/ML (604.72 MG/ML) IV
1 mmol/mL (604.72 mg/mL) | Freq: Once | INTRAVENOUS | Status: AC
Start: 2017-12-27 — End: 2017-12-27
  Administered 2017-12-27: 18:00:00 via INTRAVENOUS

## 2017-12-27 MED FILL — LORAZEPAM 2 MG/ML IJ SOLN: 2 mg/mL | INTRAMUSCULAR | Qty: 1

## 2017-12-27 MED FILL — ZOLPIDEM 5 MG TAB: 5 mg | ORAL | Qty: 1

## 2017-12-27 MED FILL — DULOXETINE 20 MG CAP, DELAYED RELEASE: 20 mg | ORAL | Qty: 1

## 2017-12-27 MED FILL — BUDESONIDE SR 3 MG 24 HR CAP: 3 mg | ORAL | Qty: 2

## 2017-12-27 MED FILL — HEPARIN, PORCINE (PF) 10 UNIT/ML IV SYRINGE: 10 unit/mL | INTRAVENOUS | Qty: 5

## 2017-12-27 MED FILL — MAGNESIUM OXIDE 400 MG TAB: 400 mg | ORAL | Qty: 1

## 2017-12-27 MED FILL — ACYCLOVIR 800 MG TAB: 800 mg | ORAL | Qty: 1

## 2017-12-27 MED FILL — VANCOMYCIN IN 0.9 % SODIUM CHLORIDE 1 GRAM/250 ML IV: 1 gram/250 mL | INTRAVENOUS | Qty: 250

## 2017-12-27 MED FILL — POTASSIUM CHLORIDE SR 20 MEQ TAB, PARTICLES/CRYSTALS: 20 mEq | ORAL | Qty: 2

## 2017-12-27 MED FILL — TACROLIMUS 1 MG CAP: 1 mg | ORAL | Qty: 4

## 2017-12-27 MED FILL — MEROPENEM 500 MG IV SOLR: 500 mg | INTRAVENOUS | Qty: 500

## 2017-12-27 MED FILL — PANTOPRAZOLE 40 MG TAB, DELAYED RELEASE: 40 mg | ORAL | Qty: 1

## 2017-12-27 MED FILL — PHARMACY VANCOMYCIN NOTE: Qty: 1

## 2017-12-27 MED FILL — BD POSIFLUSH NORMAL SALINE 0.9 % INJECTION SYRINGE: INTRAMUSCULAR | Qty: 10

## 2017-12-27 MED FILL — CHOLECALCIFEROL (VITAMIN D3) 1,000 UNIT (25 MCG) TAB: ORAL | Qty: 5

## 2017-12-27 MED FILL — GADAVIST 1 MMOL/ML (604.72 MG/ML) INTRAVENOUS SOLUTION: 1 mmol/mL (604.72 mg/mL) | INTRAVENOUS | Qty: 6

## 2017-12-27 NOTE — Progress Notes (Signed)
Neurology Note    History [name] presents with the history of having left side paresthesias and speech difficulties which resolved, started on last Monday. She is also complaining of blurred and double vision.       Neurological Examination:    Mental status: Patient is alert and oriented times three, speech and language intact, repetition intact,Fund of knowledge and cognition intact.     Cranial Nerves:   I.   Not tested.  II.   Visual fields are full to confrontation, could not check fundus  III, IV, VI.     Both pupils equal and reactive, full conjugate movements, no nystagmus.  V.    Sensation on the face symmetrical and normal.  VII.                  No facial weakness.  VIII.   Hearing normal to finger rub bilaterally.  IX, X.   Voice and articulation are normal, palate movement symmetrical  XI.   Bilateral shoulder shrug strength normal.  XII.   Tongue normal in bulk and strength, no fasciculations    Motor:  Strength in bilateral upper extremities is 5/5. Strength in bilateral lower extremities is 5/5.  Tone and bulk normal. Gait normal.   Co-ordination:  Finger to nose normal. No dysmetria.  Sensory examination: Sensation to pin prick and temperature is bilaterally symmetrical and normal.   Deep tendon reflexes: DTRs are bilaterally 0. Bilateral toes downgoing.    IMPRESSION [name] presents with the history of having left sided paresthesias and speech difficulties which resolved, and could be TIA. She does complain of double vision, which gets better closing one eye. It is possible that she might have small ischemia in 6th or 4th cranial nerve, seen commonly with diabetes.     There is full conjugate movement on examination at this time. There is no ptosis or any other focal weakness at this time. MRI brain showed small vessel changes but no acute changes. She just had LP done 3-4 days before her symptoms in [place] which did not show any malignant cells as per the patient.     Sometimes prograf can  also cause neurotoxicity, which can present with diplopia. However, MRI brain was done without contrast    PLAN  - continue mx of stroke risk factors  - could consider aspirin if not contraindicated  - will get f.up MRI with contrast  - Pt wants [place] to call her oncologist in [place], will get details from Dr. Edd Arbour  - will defer LP as she just had that done  - will eventually need eval by ophthalmology as well          Medications:    Current Facility-Administered Medications:   ???  [START ON 12/28/2017] VANCOMYCIN TROUGH DUE, , Other, ONCE, Riles, Jr. Francene Boyers, MD  ???  sodium chloride (NS) flush 10 mL, 10 mL, InterCATHeter, DAILY, Josie Saunders, Anuj, MD, 10 mL at 12/26/17 1306  ???  sodium chloride (NS) flush 10-20 mL, 10-20 mL, InterCATHeter, PRN, Josie Saunders, Anuj, MD  ???  heparin (porcine) pf 50 Units, 50 Units, InterCATHeter, PRN, Josie Saunders, Anuj, MD  ???  heparin (porcine) pf 50 Units, 50 Units, InterCATHeter, DAILY, Sundra Aland, MD, 50 Units at 12/26/17 1304  ???  DULoxetine (CYMBALTA) capsule 20 mg, 20 mg, Oral, DAILY, Sundra Aland, MD, 20 mg at 12/26/17 2216  ???  magnesium oxide (MAG-OX) tablet 400 mg, 400 mg, Oral, BID, Sundra Aland, MD, 400 mg at 12/26/17  2209  ???  pantoprazole (PROTONIX) tablet 40 mg, 40 mg, Oral, DAILY, Sundra Aland, MD, 40 mg at 12/26/17 1748  ???  meropenem (MERREM) 500 mg in 0.9% sodium chloride (MBP/ADV) 50 mL MBP, 0.5 g, IntraVENous, Q8H, Einar Grad, MD, Last Rate: 16.7 mL/hr at 12/27/17 0941, 500 mg at 12/27/17 0941  ???  acyclovir (ZOVIRAX) tablet 400 mg, 400 mg, Oral, BID, Einar Grad, MD, 400 mg at 12/26/17 2209  ???  cholecalciferol (VITAMIN D3) tablet 5,000 Units, 5,000 Units, Oral, DAILY, Einar Grad, MD, 5,000 Units at 12/26/17 1511  ???  dextrose (D50) infusion 5-25 g, 10-50 mL, IntraVENous, PRN, Einar Grad, MD  ???  glucagon (GLUCAGEN) injection 1 mg, 1 mg, IntraMUSCular, PRN, Einar Grad, MD  ???  insulin glargine (LANTUS) injection 1-100 Units, 1-100 Units, SubCUTAneous, QHS, Einar Grad, MD, 19 Units  at 12/26/17 2203  ???  insulin lispro (HUMALOG) injection 1-100 Units, 1-100 Units, SubCUTAneous, AC&HS, Einar Grad, MD, Stopped at 12/26/17 2200  ???  insulin lispro (HUMALOG) injection 1-100 Units, 1-100 Units, SubCUTAneous, PRN, Einar Grad, MD, 2 Units at 12/26/17 2203  ???  oxyCODONE IR (ROXICODONE) tablet 10 mg, 10 mg, Oral, Q4H PRN, Einar Grad, MD, 10 mg at 12/26/17 1506  ???  potassium chloride (K-DUR, KLOR-CON) SR tablet 40 mEq, 40 mEq, Oral, BID, Einar Grad, MD, 40 mEq at 12/26/17 2208  ???  tacrolimus (PROGRAF) capsule 4 mg, 4 mg, Oral, BID, Einar Grad, MD, 4 mg at 12/26/17 2208  ???  prochlorperazine (COMPAZINE) tablet 10 mg, 10 mg, Oral, Q4H PRN, Einar Grad, MD, 10 mg at 12/25/17 1228  ???  zolpidem (AMBIEN) tablet 5 mg, 5 mg, Oral, QHS PRN, Einar Grad, MD, 5 mg at 12/26/17 2212  ???  trimethoprim-sulfamethoxazole (BACTRIM DS, SEPTRA DS) 160-800 mg per tablet 1 Tab, 1 Tab, Oral, Q MON, WED & Gara Kroner, MD, 1 Tab at 12/26/17 1512  ???  naloxone (NARCAN) injection 0.1 mg, 0.1 mg, IntraVENous, PRN, Einar Grad, MD  ???  acetaminophen (TYLENOL) tablet 650 mg, 650 mg, Oral, Q4H PRN **OR** acetaminophen (TYLENOL) solution 650 mg, 650 mg, Oral, Q4H PRN **OR** acetaminophen (TYLENOL) suppository 650 mg, 650 mg, Rectal, Q4H PRN, Einar Grad, MD  ???  nilotinib (TASIGNA) capsule cap 200 mg  (Patient Supplied), 200 mg, Oral, BID, Einar Grad, MD, 200 mg at 12/27/17 0951  ???  LORazepam (ATIVAN) tablet 1 mg, 1 mg, Oral, ON CALL, Einar Grad, MD  ???  budesonide (ENTOCORT EC) capsule 6 mg, 6 mg, Oral, TID, Dawson Bills, Muhamed, MD, 6 mg at 12/26/17 2208  ???  vancomycin (VANCOCIN) 1000 mg in NS 250 ml infusion, 1,000 mg, IntraVENous, Q12H, Einar Grad, MD, Last Rate: 250 mL/hr at 12/27/17 0112, 1,000 mg at 12/27/17 0112  ???  *Pharmacy dosing vancomycin, 1 Each, Other, Rx Dosing/Monitoring, Einar Grad, MD    Vitals  Patient Vitals for the past 24 hrs:   Temp Pulse Resp BP SpO2   12/27/17 0730 98.6 ??F (37 ??C) 89 18  109/66 100 %   12/27/17 0419 97.4 ??F (36.3 ??C) 95 16 117/70 99 %   12/27/17 0017 98.4 ??F (36.9 ??C) 99 18 120/70 100 %   12/26/17 2026 98.5 ??F (36.9 ??C) 90 16 112/65 99 %   12/26/17 1600 98.6 ??F (37 ??C) 94 13 132/79 100 %   12/26/17 1109 97.7 ??F (36.5 ??C) 94 14 138/76 100 %         Imaging  Cta Head    Result Date:  12/26/2017  CTA OF THE HEAD INDICATION: Nausea, sweating, vomiting, diarrhea COMPARISON: MRI 12/26/2017, CT 12/24/2017 TECHNIQUE: Noncontrast CT the head performed followed by CTA of the head with intravenous contrast. Multiplanar two-dimensional and three-dimensional maximum intensity projection reformats performed and reviewed. DICOM format image data is available to non-affiliated external healthcare facilities or entities on a secure, media free, reciprocally searchable basis with patient authorization for 12 months following the date of the study. FINDINGS: Noncontrast CT of the head demonstrates no acute segmental infarct, intracranial hemorrhage, mass, midline shift, extra-axial fluid collection, or hydrocephalus. The vertebral arteries are codominant. There is no focal hemodynamically significant stenosis within the anterior or posterior circulation. The anterior and posterior communicating arteries appear patent. There is no definitive aneurysm.     IMPRESSION: No focal hemodynamically significant stenosis.     Cta Neck    Result Date: 12/26/2017  CTA OF THE NECK WITH CONTRAST INDICATION: Speech difficulty, and balance COMPARISON: No relevant studies. TECHNIQUE: CTA of the neck was performed with nonionic intravenous contrast with multiplanar 2-dimensional and maximum intensity projection three-dimensional images. Stenosis was evaluated using the NASCET criteria. DICOM format image data is available to non-affiliated external healthcare facilities or entities on a secure, media free, reciprocally searchable basis with patient authorization for 12 months following the date of the study. FINDINGS: There  is no hemodynamically significant stenosis within the carotid or vertebral arteries. There is no evidence of dissection. CAROTID STENOSIS REFERENCE USING NASCET CRITERIA % Stenosis = (1 - narrowest diameter/diameter of distal artery) x100 Mild: < 50% stenosis Moderate: 50-69% stenosis. Severe: 70-94% stenosis. Near occlusion: 95-99% stenosis. Occluded: 100% stenosis     IMPRESSION: No focal hemodynamically significant stenosis.      Echo Results  (Last 48 hours)               12/26/17 1341  2D ECHO COMPLETE ADULT WO CONTRAST Final result    Narrative:                                                                Study ID:    [id number]                                                   [facility]                                         [address]                                                 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   The study was technically adequate.       Small left ventricle, norla wall thickness with normal systolic function and    a   calculated ejectio fraction of 77%.   No  regional wall motion abnormalites are noted.   Grade 1 diastolic dysfunction.   Normal size right ventricle, normal systolic function.   No hemodynamic valvular pathology or signficiant regurgitation.   Estimated right ventricular systolic pressure: [id number].   Negative bubble study for interatrial shunting.   Other findings are noted below.       Prior echo was done 04/25/2017 the EF was reported at 63%. Grade 1 diastolic   dysfunction. RVSP 64mmHg.           _____________________________________________________________________________   __           Left Ventricle   The left ventricular cavity is small. There is normal left ventricular wall   thickness. Left ventricular systolic function is normal. The calculated left   ventricular ejection fraction is 77%. Based on the transmitral spectral   Doppler flow pattern the diastolic function is Grade 1. No regional wall   motion abnormalities noted.       Right Ventricle   The right ventricle is normal size. Right ventricular systolic function is   normal: TAPSE: 2.1cm. Tissue Doppler Interrogation of RV S' wave correlates   with TAPSE.       Atria   Left atrial  volume index: 36ml/m2. Right atrial volume index 14ml/m2.    Injection   of Saline Contrast was negative for interatrial shunting.           Mitral Valve   The mitral valve leaflets appear normal. There is no evidence of stenosis,   fluttering, or prolapse. There is no evidence of mitral valve prolapse. There   is no mitral regurgitation noted.       Tricuspid Valve   The tricuspid valve is normal. There is no tricuspid valve prolapse.    Tricuspid   regurgitation is not well seen with Colorflow Doppler. Tricuspid    regurgitation   peak flow velocity is documented at 2.2 meters per second. Based on the peak   tricuspid regurgitation velocity the estimated right ventricular systolic   pressure is 23 mmHg.       Aortic Valve   The aortic valve is normal in structure and function. No aortic regurgitation   is  present.       Pulmonic Valve   The pulmonic valve leaflets are thin and pliable; valve motion is normal.   Trivial pulmonic valvular regurgitation.       Great Vessels   The aortic root is normal size.           Pericardium/Pleural   There is no pericardial effusion.       MEASUREMENTS/CALCULATIONS:       MMode/2D Measurements & Calculations   RVDd: 2.6 cm                           LVIDd: 3.6 cm   IVSd: 1.0 cm                           LVIDs: 2.3 cm                                          LVPWd: 0.99 cm           _______________________________________________________________       FS: 35.6 %                             Ao root diam: 3.4 cm                                          Ao root area: 8.8 cm2               _______________________________________________________________   TAPSE: 2.1 cm                                          RA A4Cs: 9.3 cm2           _______________________________________________________________   LA ESV (BP): 19.4 ml                                          LA ESV Index (BP): 11.5 ml/m2           _______________________________________________________________  RA ESV Index (A4C): 9.3 ml/m2       Doppler Measurements & Calculations   MV A dur: 0.09 sec                      MV dec time: 0.27 sec   MV E max vel: 74.1 cm/sec   MV A max vel: 83.3 cm/sec   MV E/A: 0.89   LV IVRT: 0.09 sec               _______________________________________________________________   Lat Peak E' Vel: 8.0 cm/sec             Med Peak E' Vel: 4.9 cm/sec               _______________________________________________________________   Ao V2 max: 158.6 cm/sec                 LV V1 max PG: 6.6 mmHg   Ao max PG: 10.1 mmHg                    LV V1 max: 128.6 cm/sec           _______________________________________________________________       TR max vel: 222.4 cm/sec                RAP systole: 3.0 mmHg   TR max PG: 19.8 mmHg   RVSP(TR): 22.8 mmHg            _______________________________________________________________       Fatima Sanger A Revs Vel: 32.8 cm/sec            AVA Dim Index: 0.81   Pulm A Revs Dur: 0.10 sec           _______________________________________________________________       E/E' lat: 9.3                           E/E' med: 15.2           Electronically signed byDr. Loyal Buba. Herma Mering., MD   12/26/2017 01:41                           PM               Labs  No results found for this visit on 12/24/17 (from the past 12 hour(s)).  Lab Results   Component Value Date/Time    Cholesterol, total 174 12/26/2017 04:40 AM    HDL Cholesterol 57 12/26/2017 04:40 AM    LDL, calculated 88 12/26/2017 04:40 AM    Triglyceride 145 12/26/2017 04:40 AM    CHOL/HDL Ratio 3.1 12/26/2017 04:40 AM           Delora Fuel, MD

## 2017-12-27 NOTE — Progress Notes (Signed)
pt in rm [id number] [name] Wu, [name] Wu pt of [facility]  lab results returned positive blood cultures anaerobic bottle 6   gram positive cocci clusters  pt is currently on Meropenam and vanomycin for results from 8/14  Sherrie Sport RN [id number]    03:29 Dr. Clydene Fake returned page regarding critical labs and said to continue with antibotics and monitor.

## 2017-12-27 NOTE — Progress Notes (Signed)
Progress  Notes by [name], MD at 12/27/17 1096                Author: [name], MD  Service: ONCOLOGY  Author Type: Physician       Filed: 12/27/17 0804  Date of Service: 12/27/17 0801  Status: Signed          Editor: [name], MD (Physician)                     Hematology / Oncology Progress Note         Patient: [name] Wu  Gender:  female   MRN: [id number]      Date of Birth: March 31, 1961 Age: 57 y.o.    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  Continue prophylactic medications.  Continue with to [place].  Thrombocytopenia.  Repeat CBC on Monday.   Diplopia.  ?  Etiology.  Neuro work-up in progress.  May need spinal tap.   May need to transfuse platelets prior to spinal tap if thrombocytopenia still less than 100,000.        Subjective:     Patient still has diplopia.  Unchanged.  No progression of symptoms but no improvement either.  MRI of the brain so far is unremarkable for acute process        Objective:        Visit Vitals      BP  109/66     Pulse  89     Temp  98.6 ??F (37 ??C)     Resp  18     Ht  5' (1.524 m)     Wt  66.3 kg (146 lb 2.6 oz)     SpO2  100%        BMI  22.22 kg/m??                 Intake/Output Summary (Last 24 hours) at 12/27/2017 0801   Last data filed at 12/26/2017 1603     Gross per 24 hour        Intake  480 ml        Output  --        Net  480 ml             Review of Systems:     Constitutional: negative for fevers, chills, sweats.  Mild fatigue   Eyes: negative for  redness and icterus.  Has diplopia.  No blurred vision no blind spots.   Ears, Nose, Mouth, Throat, and Face: negative for tinnitus, epistaxis, sore mouth and hoarseness   Respiratory: negative for cough, sputum, hemoptysis, pleurisy/chest pain or  wheezing   Cardiovascular: negative for chest pain, chest pressure/discomfort, palpitations, irregular heart beats, syncope, paroxysmal nocturnal dyspnea   Gastrointestinal: negative for reflux symptoms, nausea, vomiting, change in bowel habits, melena, diarrhea, constipation and abdominal pain   Genitourinary:negative for dysuria, nocturia, urinary incontinence, hesitancy and hematuria   Integument: negative for rash, skin lesion(s) and pruritus   Hematologic/Lymphatic:  negative for easy bruising, bleeding and lymphadenopathy   Musculoskeletal:negative for myalgias, arthralgias and bone pain   Neurological: negative for headaches, dizziness, seizures, paresthesia and weakness        Physical Assessment:     Constitutional: Alert, oriented, not in distress   Eyes: PERRLA, anicteric, no redness   Ears, nose, mouth, throat, and face: no palpable Lymph nodes, no mucositis, no thrush.   Respiratory: symmetrical expansion, no rales, no rhonchi, no wheezing.   Cardiovascular: S1S2, no pathologic murmur, no rub.   Gastrointestinal: soft, benign, non tender, no HSM, normal bowel sounds, no mass.   Integument: no rash, no petechiae, no ecchymosis.   Musculoskeletal: no edema, no cyanosis, no clubbing.   Neurological: intact,  no focal motor or sensory deficits.  Has diplopia.        Diagnostic test:          Basic Metabolic Profile     Recent Labs         12/26/17   0440  12/24/17   2256  12/24/17   2230      NA  140   --   139      K  4.7   --   4.4      CL  108*   --   105      CO2  27   --   25      BUN  6*   --   10      CREA  0.7  0.7  0.8      GLU  207*   --   239*      CA  8.9   --   8.4*      MG  1.4*   --   2.2                   CBC w/Diff       Recent Labs         12/26/17   0440  12/25/17   0010      WBC  3.6*  2.9*      HGB  9.8*  10.4*      HCT  29.0*  29.5*      PLT  53*  55*           Recent Labs         12/25/17   0010      GRANS  63.3                   CHEMISTRY     Lab Results      Component  Value  Date         ALB  3.3 (L)  12/24/2017        TP  5.5 (L)  12/24/2017        CBIL  0.1  12/24/2017        TBILI  0.4  12/24/2017        ALT  29  12/24/2017        SGOT  16  12/24/2017        AP  96  12/24/2017        LPSE  41 (L)  12/24/2017                   Coagulation     Lab Results      Component  Value  Date/Time  Prothrombin time  12.1  12/25/2017 03:18 AM        Prothrombin time  14.1 (H)  05/13/2017 08:33 AM        Prothrombin time  12.5  04/25/2017 07:04 AM        INR  1.0  12/25/2017 03:18 AM        INR  1.2 (H)  05/13/2017 08:33 AM        INR  1.1  04/25/2017 07:04 AM                  Current Medications:        Current Facility-Administered Medications:    ?  sodium chloride (NS) flush 10 mL, 10 mL, InterCATHeter, DAILY, Josie Saunders, Anuj, MD, 10 mL at 12/26/17 1306   ?  sodium chloride (NS) flush 10-20 mL, 10-20 mL, InterCATHeter, PRN, Sundra Aland, MD   ?  heparin (porcine) pf 50 Units, 50 Units, InterCATHeter, PRN, Sundra Aland, MD   ?  heparin (porcine) pf 50 Units, 50 Units, InterCATHeter, DAILY, Sundra Aland, MD, 50 Units at 12/26/17 1304   ?  DULoxetine (CYMBALTA) capsule 20 mg, 20 mg, Oral, DAILY, Josie Saunders, Anuj, MD, 20 mg at 12/26/17 2216   ?  magnesium oxide (MAG-OX) tablet 400 mg, 400 mg, Oral, BID, Sundra Aland, MD, 400 mg at 12/26/17 2209   ?  pantoprazole (PROTONIX) tablet 40 mg, 40 mg, Oral, DAILY, Sundra Aland, MD, 40 mg at 12/26/17 1748   ?  meropenem (MERREM) 500 mg in 0.9% sodium chloride (MBP/ADV) 50 mL MBP, 0.5 g, IntraVENous, Q8H, Einar Grad, MD, Last Rate: 16.7 mL/hr at 12/27/17 0242, 500 mg at 12/27/17 0242   ?  acyclovir (ZOVIRAX) tablet 400 mg, 400 mg, Oral, BID, Einar Grad, MD, 400 mg at 12/26/17 2209   ?  cholecalciferol (VITAMIN D3) tablet 5,000 Units, 5,000 Units, Oral, DAILY, Einar Grad, MD, 5,000 Units at 12/26/17 1511   ?  dextrose (D50) infusion 5-25 g, 10-50 mL, IntraVENous, PRN, Einar Grad, MD   ?  glucagon (GLUCAGEN) injection 1 mg, 1 mg, IntraMUSCular, PRN, Einar Grad, MD   ?  insulin glargine (LANTUS) injection 1-100 Units, 1-100 Units, SubCUTAneous, QHS, Einar Grad, MD, 19 Units at 12/26/17 2203   ?  insulin lispro (HUMALOG) injection 1-100 Units, 1-100 Units, SubCUTAneous, AC&HS, Einar Grad, MD, Stopped at 12/26/17 2200   ?  insulin lispro (HUMALOG) injection 1-100 Units, 1-100 Units, SubCUTAneous, PRN, Einar Grad, MD, 2 Units at 12/26/17 2203   ?  oxyCODONE IR (ROXICODONE) tablet 10 mg, 10 mg, Oral, Q4H PRN, Einar Grad, MD, 10 mg at 12/26/17 1506   ?  potassium chloride (K-DUR, KLOR-CON) SR tablet 40 mEq, 40 mEq, Oral, BID, Einar Grad, MD, 40 mEq at 12/26/17 2208   ?  tacrolimus (PROGRAF) capsule 4 mg, 4 mg, Oral, BID, Einar Grad, MD, 4 mg at 12/26/17 2208   ?  prochlorperazine (COMPAZINE) tablet 10 mg, 10 mg, Oral, Q4H PRN, Einar Grad, MD, 10 mg at 12/25/17 1228   ?  zolpidem (AMBIEN) tablet 5 mg, 5 mg, Oral, QHS PRN, Einar Grad, MD, 5 mg at 12/26/17 2212   ?  trimethoprim-sulfamethoxazole (BACTRIM DS, SEPTRA DS) 160-800 mg per tablet 1 Tab, 1 Tab, Oral, Q MON, WED & Gara Kroner, MD, 1 Tab at 12/26/17 1512   ?  naloxone [facility]) injection 0.1 mg, 0.1 mg, IntraVENous, PRN, Einar Grad, MD   ?  acetaminophen (TYLENOL) tablet 650 mg, 650 mg, Oral, Q4H  PRN **OR** acetaminophen (TYLENOL) solution 650 mg, 650 mg, Oral, Q4H PRN **OR** acetaminophen (TYLENOL) suppository 650 mg, 650 mg, Rectal, Q4H PRN, Einar Grad, MD   ?  nilotinib (TASIGNA) capsule cap 200 mg  (Patient Supplied), 200 mg, Oral, BID, Einar Grad, MD, 200 mg at 12/26/17 2210   ?  LORazepam (ATIVAN) tablet 1 mg, 1 mg, Oral, ON CALL, Einar Grad, MD   ?  budesonide (ENTOCORT EC) capsule 6 mg, 6 mg, Oral, TID, Jasarevic, Muhamed, MD, 6 mg at 12/26/17 2208   ?  vancomycin (VANCOCIN) 1000 mg in NS 250 ml infusion, 1,000 mg, IntraVENous, Q12H, Einar Grad, MD, Last Rate: 250 mL/hr at 12/27/17 0112, 1,000 mg at 12/27/17 0112   ?  *Pharmacy dosing vancomycin, 1 Each, Other, Rx  Dosing/Monitoring, Einar Grad, MD            [name], MD    [facility] [phone]

## 2017-12-27 NOTE — Progress Notes (Signed)
Hospitalist Progress Note    Patient: [name] Wu MRN: [id number]  CSN: [id number]    Date of Birth: 09-25-1960  Age: 57 y.o.  Sex: female    DOA: 12/24/2017 LOS:  LOS: 1 day          Chief Complaint:   Chief Complaint   Patient presents with   ??? Nausea   ??? Double Vision   ??? Diarrhea       Assessment/Plan     1. Diplopia with transient slurred speech, left upper extremity weakness, gait disturbances. ??Concern for TIA/CVA, consider meningeal malignancy  2. Bacteremia, GPC clusters x 2 bottles, concern for blood stream infection  3. 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  5. SIRS  6. 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 Pancytopenia secondary to above  8. Insulin-dependent type 2 diabetes  9. Chronic pain    Patient Active Problem List   Diagnosis Code   ??? Chest pressure R07.89   ??? Elevated troponin R74.8   ??? Thrombocytopenia (Agency) D69.6   ??? Neutropenic fever (HCC) D70.9, R50.81   ??? TIA (transient ischemic attack) G45.9   ??? Bacteremia R78.81       Subjective: 57 yo female patient with ALL who has been followed locally by Dr. Edd Arbour and overall by [facility] in [place] for care who was admitted for evaluation of diplopia and slurred speech.  Appreciate all consultants and will follow.  At present pt's diplopia has cleared and she notes that as soon as the PICC line came out that there started to be an improvement.  Her appetite and general well being has improved as well.        Review of systems:   []  Unable to obtain  ROS due to  [] mental status change  [] sedated   [] intubated    Constitutional: No fever, chills, or weight loss  Eyes: No visual symptoms.  ENT: No sore throat, runny nose or ear pain.  Respiratory: No cough, dyspnea or wheezing.  Cardiovascular: No  chest pain, pressure, palpitations, tightness or heaviness.  Gastrointestinal: No vomiting, diarrhea or abdominal pain.  Genitourinary: No dysuria, frequency, or urgency.  Musculoskeletal: No joint pain or swelling.  Integumentary: No rashes.  Neurological: No headaches, sensory or motor symptoms.    Vital signs/Intake and Output:  Visit Vitals  BP 132/82   Pulse 90   Temp 98.1 ??F (36.7 ??C)   Resp 20   Ht 5' (1.524 m)   Wt 66.3 kg (146 lb 2.6 oz)   SpO2 100%   BMI 22.22 kg/m??     Current Shift:  No intake/output data recorded.  Last three shifts:  08/16 0701 - 08/17 1900  In: 6270 [P.O.:840; I.V.:850]  Out: -     Exam:    General:  Alert, cooperative, no distress, appears stated age.   Head:  Normocephalic, without obvious abnormality, atraumatic.   Eyes:  Conjunctivae/corneas clear. PERRL, EOMs intact. Fundi benign       Nose: Nares normal. Septum midline. Mucosa normal. No drainage or sinus tenderness.   Throat: Lips, mucosa, and tongue normal.    Neck: Supple, symmetrical, trachea midline, no adenopathy, thyroid: no enlargement/tenderness/nodules, no carotid bruit and no JVD.   Back:   Symmetric, no curvature. ROM normal. No CVA tenderness.   Lungs:  Clear to auscultation bilaterally.   Chest wall:  No tenderness or deformity.   Heart:  Regular rate and rhythm, S1, S2 normal, no murmur, click, rub or gallop.   Abdomen:   Soft, non-tender. Bowel sounds normal. No masses,  No organomegaly.           Extremities: Extremities normal, atraumatic, no cyanosis or edema.       Skin: Skin color, texture, turgor normal. No rashes or lesions       Neurologic: CNII-XII intact. Normal strength, sensation  throughout.             Labs:    Sodium   Date Value Ref Range Status   12/26/2017 140 136 - 145 mEq/L Final     Potassium   Date Value Ref Range Status   12/26/2017 4.7 3.5 - 5.1 mEq/L Final     BUN   Date Value Ref Range Status   12/26/2017 6 (L) 7 - 25 mg/dl Final     Calcium   Date Value Ref Range Status   12/26/2017  8.9 8.5 - 10.1 mg/dl Final     Chloride   Date Value Ref Range Status   12/26/2017 108 (H) 98 - 107 mEq/L Final     CO2   Date Value Ref Range Status   12/26/2017 27 21 - 32 mEq/L Final     Glucose   Date Value Ref Range Status   12/26/2017 207 (H) 74 - 106 mg/dl Final     HGB   Date Value Ref Range Status   12/26/2017 9.8 (L) 13.0 - 17.2 gm/dl Final     HCT   Date Value Ref Range Status   12/26/2017 29.0 (L) 37.0 - 50.0 % Final     WBC   Date Value Ref Range Status   12/26/2017 3.6 (L) 4.0 - 11.0 1000/mm3 Final     PLATELET   Date Value Ref Range Status   12/26/2017 53 (L) 140 - 450 1000/mm3 Final         Procedures/imaging: see electronic medical records for all procedures/Xrays and details which were not copied into this note but were reviewed prior to creation of Plan      Jr. Westley Chandler, MD  December 27, 2017

## 2017-12-27 NOTE — Progress Notes (Signed)
Progress Notes by Orma Flaming. Heloise Purpura, MD at 12/27/17 1257                Author: Orma Flaming. Heloise Purpura, MD  Service: Infectious Disease  Author Type: Physician       Filed: 12/27/17 1507  Date of Service: 12/27/17 1257  Status: Signed          Editor: Orma Flaming. Heloise Purpura, MD (Physician)                            Infectious Disease Follow-up       Admit Date: 12/24/2017      Today's Date: 12/27/17            Current abx  Prior abx        Prophylactic Bactrim tiw, ACV vanco/meropenem 8/16-0             Assessment:          Pos bctx's GPC cl 2 of 2 8/14-MRSE, suspect early picc infection    -NEW repeat bctx's 8/16 2 of 2 GPC cl incl from picc    -NEW TTE no reported vegetation, bubble study negative      AML sp chemo/stem cell xplant   -h/o meningeal leukemia on IT chemo in [place], last ~1 mo ago per [facility]'s note -- Lennie Muckle [phone] or [phone]; Dr. Hart Carwin [phone]   -on tassigra comp GVH skin, esophagus    -bactrim tiw, acyclovir prophylaxis   -pancytopenia ch thrombocytopenia, not neutropenic (ANC 8/15 1800)      L numbness, weakness n/v vision change diplopia began Monday 8/12- CTH neg       DM on insulin        HTN                       PCN allergy             Rec:     ->continue iv vancomycin   ->stop iv meropenem    -> d/c picc line and culture tip -- pt asked me to call [name] physicians before agreeing; I d/w on-call physician Dr. Keene Breath [phone] who agreed with this; pt called him in my presence to confirm    ->repeat bctx's x 2 in am to confirm clearance of bacteremia.  If neg after 2-3 days may replace picc if needed soon; if persistent bsi would likely need TEE.     ->await repeat MRI and defer w/u for diplopia to Neurology, appears holding on LP for now   -d/w Dr. Ceasar Lund         MICROBIOLOGY:     8/14 bctx x 2 MRSE    uctx ngtd   8/16 bctx x 2 GPC cl        LINES AND CATHETERS:     PICC        Active Hospital Problems            Diagnosis  Date Noted         ?  Bacteremia  12/26/2017         ?  TIA (transient ischemic attack)  12/25/2017                Subjective:        Interval notes reviewed. D/w pt repeat bctx's before abx also growing GPC cl, prior cultures were MRSE, suspect early  picc line infection but with high grade BSI and immunosuppressed patient rec removal over attempt to salvage here.  She refused picc  removal until after I called her transplant physicians, d/w Dr. Keene Breath who agreed with plan, and patient called him while I was in room to confirm what I said.  D/w Dr. Ceasar Lund today and will plan line holiday and repeat bctx's in am        Current Facility-Administered Medications          Medication  Dose  Route  Frequency           ?  [START ON 12/28/2017] VANCOMYCIN TROUGH DUE     Other  ONCE     ?  sodium chloride (NS) flush 10 mL   10 mL  InterCATHeter  DAILY     ?  sodium chloride (NS) flush 10-20 mL   10-20 mL  InterCATHeter  PRN     ?  heparin (porcine) pf 50 Units   50 Units  InterCATHeter  PRN     ?  heparin (porcine) pf 50 Units   50 Units  InterCATHeter  DAILY     ?  DULoxetine (CYMBALTA) capsule 20 mg   20 mg  Oral  DAILY     ?  magnesium oxide (MAG-OX) tablet 400 mg   400 mg  Oral  BID     ?  pantoprazole (PROTONIX) tablet 40 mg   40 mg  Oral  DAILY     ?  meropenem (MERREM) 500 mg in 0.9% sodium chloride (MBP/ADV) 50 mL MBP   0.5 g  IntraVENous  Q8H     ?  acyclovir (ZOVIRAX) tablet 400 mg   400 mg  Oral  BID     ?  cholecalciferol (VITAMIN D3) tablet 5,000 Units   5,000 Units  Oral  DAILY     ?  dextrose (D50) infusion 5-25 g   10-50 mL  IntraVENous  PRN           ?  glucagon (GLUCAGEN) injection 1 mg   1 mg  IntraMUSCular  PRN           ?  insulin glargine (LANTUS) injection 1-100 Units   1-100 Units  SubCUTAneous  QHS     ?  insulin lispro (HUMALOG) injection 1-100 Units   1-100 Units  SubCUTAneous  AC&HS     ?  insulin lispro (HUMALOG) injection 1-100 Units   1-100 Units  SubCUTAneous  PRN     ?  oxyCODONE  IR (ROXICODONE) tablet 10 mg   10 mg  Oral  Q4H PRN     ?  potassium chloride (K-DUR, KLOR-CON) SR tablet 40 mEq   40 mEq  Oral  BID     ?  tacrolimus (PROGRAF) capsule 4 mg   4 mg  Oral  BID     ?  prochlorperazine (COMPAZINE) tablet 10 mg   10 mg  Oral  Q4H PRN     ?  zolpidem (AMBIEN) tablet 5 mg   5 mg  Oral  QHS PRN     ?  trimethoprim-sulfamethoxazole (BACTRIM DS, SEPTRA DS) 160-800 mg per tablet 1 Tab   1 Tab  Oral  Q MON, WED & FRI     ?  naloxone (NARCAN) injection 0.1 mg   0.1 mg  IntraVENous  PRN     ?  acetaminophen (TYLENOL) tablet 650 mg   650 mg  Oral  Q4H PRN  Or           ?  acetaminophen (TYLENOL) solution 650 mg   650 mg  Oral  Q4H PRN          Or           ?  acetaminophen (TYLENOL) suppository 650 mg   650 mg  Rectal  Q4H PRN     ?  nilotinib (TASIGNA) capsule cap 200 mg  (Patient Supplied)   200 mg  Oral  BID     ?  LORazepam (ATIVAN) tablet 1 mg   1 mg  Oral  ON CALL     ?  budesonide (ENTOCORT EC) capsule 6 mg   6 mg  Oral  TID           ?  vancomycin (VANCOCIN) 1000 mg in NS 250 ml infusion   1,000 mg  IntraVENous  Q12H           ?  *Pharmacy dosing vancomycin   1 Each  Other  Rx Dosing/Monitoring                Objective:        Visit Vitals      BP  109/69     Pulse  81     Temp  97.9 ??F (36.6 ??C)     Resp  18     Ht  5' (1.524 m)     Wt  66.3 kg (146 lb 2.6 oz)     SpO2  98%        BMI  22.22 kg/m??           Temp (24hrs), Avg:98.2 ??F (36.8 ??C), Min:97.4 ??F (36.3 ??C), Max:98.6 ??F (37 ??C)      GEN: WFWN BF appears stated age lying in bed NAD wearing mask   HEENT: anicteric sclerae moist orophyarynx   NECK: Supple    CHEST: no rales rhonchi or wheeze   CVS: S1'S2' no rub murmur or gallop   ABD: soft non-tender (+) BS no masses non-distended   EXT: no edema RUE picc in place          Labs:  Results:        Chemistry  Recent Labs         12/26/17   0440  12/24/17   2256  12/24/17   2230      GLU  207*   --   239*      NA  140   --   139      K  4.7   --   4.4      CL  108*   --   105       CO2  27   --   25      BUN  6*   --   10      CREA  0.7  0.7  0.8      CA  8.9   --   8.4*      AGAP  6   --   9      AP   --    --   96      TP   --    --   5.5*      ALB   --    --   3.3*                 CBC w/Diff  Recent Labs         12/26/17   0440  12/25/17   0010      WBC  3.6*  2.9*      RBC  2.83*  3.02*      HGB  9.8*  10.4*      HCT  29.0*  29.5*      PLT  53*  55*      GRANS   --   63.3      LYMPH   --   25.2*      EOS   --   1.0                 Mri brain 8/16 IMPRESSION:   1.   Nonspecific white matter hyperintensities likely due to chronic   microvascular ischemic changes. 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   2. Low-lying cerebellar tonsils.   3. No orbital masses. No infarction      Echo 8/16 Interpretation Summary  A complete two-dimensional transthoracic echocardiogram was performed (2D, M-  mode, Doppler and color flow Doppler).  The study was technically adequate.    Small left ventricle, norla wall thickness  with normal systolic function and   a  calculated ejectio fraction of 77%.  No regional wall motion abnormalites are noted.  Grade 1 diastolic dysfunction.  Normal size right ventricle, normal systolic function.  No hemodynamic  valvular pathology or signficiant regurgitation.  Estimated right ventricular systolic pressure: 81OFBP.  Negative bubble study for interatrial shunting.      Microbiology Results   No results for input(s): SDES, CULT in the last 72 hours.      Estelle Grumbles, MD   December 27, 2017   [facility]   [phone]

## 2017-12-27 NOTE — Other (Signed)
Bedside and Verbal shift change report given to Savannah K Sloop-Stone   (oncoming nurse) by Dion, RN  (offgoing nurse). Report included the following information SBAR, Kardex, Recent Results and Cardiac Rhythm NSR.

## 2017-12-27 NOTE — Progress Notes (Addendum)
pt in rm [id number], [facility] pt of [facility]  lab results returned positive blood cultures anaerobic bottle 6   gram positive cocci clusters  pt is currently on Meropenam and vanomycin for results from 8/14  Sherrie Sport RN [id number]    03:29 Dr. Clydene Fake returned page regarding critical labs and said to continue with antibotics and monitor.

## 2017-12-27 NOTE — Progress Notes (Signed)
Hematology / Oncology Progress Note      Patient: [name] Wu  Gender: female   MRN: [id number]    Date of Birth: 1961/03/11 Age: 57 y.o.   CSN: [id number]    LOS:  LOS: 1 day   Admit Date: 12/24/2017     PCP: Lacey Jensen, MD    Assessment:   Active Problems:    TIA (transient ischemic attack) (12/25/2017)      Bacteremia (12/26/2017)        Plan:   [place]adelphia chromosome positive ALL.  Status post stem cell.  Continue prophylactic medications.  Continue with to [place].  Thrombocytopenia.  Repeat CBC on Monday.  Diplopia.  ?  Etiology.  Neuro work-up in progress.  May need spinal tap.  May need to transfuse platelets prior to spinal tap if thrombocytopenia still less than 100,000.    Subjective:   Patient still has diplopia.  Unchanged.  No progression of symptoms but no improvement either.  MRI of the brain so far is unremarkable for acute process    Objective:     Visit Vitals  BP 109/66   Pulse 89   Temp 98.6 ??F (37 ??C)   Resp 18   Ht 5' (1.524 m)   Wt 66.3 kg (146 lb 2.6 oz)   SpO2 100%   BMI 22.22 kg/m??             Intake/Output Summary (Last 24 hours) at 12/27/2017 0801  Last data filed at 12/26/2017 1603  Gross per 24 hour   Intake 480 ml   Output ???   Net 480 ml       Review of Systems:   Constitutional: negative for fevers, chills, sweats.  Mild fatigue  Eyes: negative for  redness and icterus.  Has diplopia.  No blurred vision no blind spots.  Ears, Nose, Mouth, Throat, and Face: negative for tinnitus, epistaxis, sore mouth and hoarseness  Respiratory: negative for cough, sputum, hemoptysis, pleurisy/chest pain or wheezing  Cardiovascular: negative for chest pain, chest pressure/discomfort, palpitations, irregular heart beats, syncope, paroxysmal nocturnal dyspnea  Gastrointestinal: negative for reflux symptoms, nausea, vomiting, change in bowel habits, melena, diarrhea, constipation and abdominal pain  Genitourinary:negative for dysuria, nocturia, urinary incontinence, hesitancy and hematuria   Integument: negative for rash, skin lesion(s) and pruritus  Hematologic/Lymphatic: negative for easy bruising, bleeding and lymphadenopathy  Musculoskeletal:negative for myalgias, arthralgias and bone pain  Neurological: negative for headaches, dizziness, seizures, paresthesia and weakness    Physical Assessment:   Constitutional: Alert, oriented, not in distress  Eyes: PERRLA, anicteric, no redness  Ears, nose, mouth, throat, and face: no palpable Lymph nodes, no mucositis, no thrush.  Respiratory: symmetrical expansion, no rales, no rhonchi, no wheezing.  Cardiovascular: S1S2, no pathologic murmur, no rub.  Gastrointestinal: soft, benign, non tender, no HSM, normal bowel sounds, no mass.  Integument: no rash, no petechiae, no ecchymosis.  Musculoskeletal: no edema, no cyanosis, no clubbing.  Neurological: intact,  no focal motor or sensory deficits.  Has diplopia.    Diagnostic test:     Basic Metabolic Profile   Recent Labs     12/26/17  0440 12/24/17  2256 12/24/17  2230   NA 140  --  139   K 4.7  --  4.4   CL 108*  --  105   CO2 27  --  25   BUN 6*  --  10   CREA 0.7 0.7 0.8   GLU 207*  --  239*  CA 8.9  --  8.4*   MG 1.4*  --  2.2        CBC w/Diff    Recent Labs     12/26/17  0440 12/25/17  0010   WBC 3.6* 2.9*   HGB 9.8* 10.4*   HCT 29.0* 29.5*   PLT 53* 55*    Recent Labs     12/25/17  0010   GRANS 63.3        CHEMISTRY   Lab Results   Component Value Date    ALB 3.3 (L) 12/24/2017    TP 5.5 (L) 12/24/2017    CBIL 0.1 12/24/2017    TBILI 0.4 12/24/2017    ALT 29 12/24/2017    SGOT 16 12/24/2017    AP 96 12/24/2017    LPSE 41 (L) 12/24/2017         Coagulation   Lab Results   Component Value Date/Time    Prothrombin time 12.1 12/25/2017 03:18 AM    Prothrombin time 14.1 (H) 05/13/2017 08:33 AM    Prothrombin time 12.5 04/25/2017 07:04 AM    INR 1.0 12/25/2017 03:18 AM    INR 1.2 (H) 05/13/2017 08:33 AM    INR 1.1 04/25/2017 07:04 AM        Current Medications:      Current Facility-Administered Medications:   ???  sodium chloride (NS) flush 10 mL, 10 mL, InterCATHeter, DAILY, Josie Saunders, Anuj, MD, 10 mL at 12/26/17 1306  ???  sodium chloride (NS) flush 10-20 mL, 10-20 mL, InterCATHeter, PRN, Josie Saunders, Anuj, MD  ???  heparin (porcine) pf 50 Units, 50 Units, InterCATHeter, PRN, Josie Saunders, Anuj, MD  ???  heparin (porcine) pf 50 Units, 50 Units, InterCATHeter, DAILY, Sundra Aland, MD, 50 Units at 12/26/17 1304  ???  DULoxetine (CYMBALTA) capsule 20 mg, 20 mg, Oral, DAILY, Sundra Aland, MD, 20 mg at 12/26/17 2216  ???  magnesium oxide (MAG-OX) tablet 400 mg, 400 mg, Oral, BID, Sundra Aland, MD, 400 mg at 12/26/17 2209  ???  pantoprazole (PROTONIX) tablet 40 mg, 40 mg, Oral, DAILY, Sundra Aland, MD, 40 mg at 12/26/17 1748  ???  meropenem (MERREM) 500 mg in 0.9% sodium chloride (MBP/ADV) 50 mL MBP, 0.5 g, IntraVENous, Q8H, Einar Grad, MD, Last Rate: 16.7 mL/hr at 12/27/17 0242, 500 mg at 12/27/17 0242  ???  acyclovir (ZOVIRAX) tablet 400 mg, 400 mg, Oral, BID, Einar Grad, MD, 400 mg at 12/26/17 2209  ???  cholecalciferol (VITAMIN D3) tablet 5,000 Units, 5,000 Units, Oral, DAILY, Einar Grad, MD, 5,000 Units at 12/26/17 1511  ???  dextrose (D50) infusion 5-25 g, 10-50 mL, IntraVENous, PRN, Einar Grad, MD  ???  glucagon (GLUCAGEN) injection 1 mg, 1 mg, IntraMUSCular, PRN, Einar Grad, MD  ???  insulin glargine (LANTUS) injection 1-100 Units, 1-100 Units, SubCUTAneous, QHS, Einar Grad, MD, 19 Units at 12/26/17 2203  ???  insulin lispro (HUMALOG) injection 1-100 Units, 1-100 Units, SubCUTAneous, AC&HS, Einar Grad, MD, Stopped at 12/26/17 2200  ???  insulin lispro (HUMALOG) injection 1-100 Units, 1-100 Units, SubCUTAneous, PRN, Einar Grad, MD, 2 Units at 12/26/17 2203  ???  oxyCODONE IR (ROXICODONE) tablet 10 mg, 10 mg, Oral, Q4H PRN, Einar Grad, MD, 10 mg at 12/26/17 1506  ???  potassium chloride (K-DUR, KLOR-CON) SR tablet 40 mEq, 40 mEq, Oral, BID, Einar Grad, MD, 40 mEq at 12/26/17 2208   ???  tacrolimus (PROGRAF) capsule 4 mg, 4 mg, Oral, BID, Einar Grad, MD, 4 mg at 12/26/17 2208  ???  prochlorperazine (COMPAZINE) tablet 10 mg, 10 mg, Oral, Q4H PRN, Einar Grad, MD, 10 mg at 12/25/17 1228  ???  zolpidem (AMBIEN) tablet 5 mg, 5 mg, Oral, QHS PRN, Einar Grad, MD, 5 mg at 12/26/17 2212  ???  trimethoprim-sulfamethoxazole (BACTRIM DS, SEPTRA DS) 160-800 mg per tablet 1 Tab, 1 Tab, Oral, Q MON, WED & Gara Kroner, MD, 1 Tab at 12/26/17 1512  ???  naloxone (NARCAN) injection 0.1 mg, 0.1 mg, IntraVENous, PRN, Einar Grad, MD  ???  acetaminophen (TYLENOL) tablet 650 mg, 650 mg, Oral, Q4H PRN **OR** acetaminophen (TYLENOL) solution 650 mg, 650 mg, Oral, Q4H PRN **OR** acetaminophen (TYLENOL) suppository 650 mg, 650 mg, Rectal, Q4H PRN, Einar Grad, MD  ???  nilotinib (TASIGNA) capsule cap 200 mg  (Patient Supplied), 200 mg, Oral, BID, Einar Grad, MD, 200 mg at 12/26/17 2210  ???  LORazepam (ATIVAN) tablet 1 mg, 1 mg, Oral, ON CALL, Einar Grad, MD  ???  budesonide (ENTOCORT EC) capsule 6 mg, 6 mg, Oral, TID, Dawson Bills, Muhamed, MD, 6 mg at 12/26/17 2208  ???  vancomycin (VANCOCIN) 1000 mg in NS 250 ml infusion, 1,000 mg, IntraVENous, Q12H, Einar Grad, MD, Last Rate: 250 mL/hr at 12/27/17 0112, 1,000 mg at 12/27/17 0112  ???  *Pharmacy dosing vancomycin, 1 Each, Other, Rx Dosing/Monitoring, Einar Grad, MD        [name], MD   [facility] [phone]

## 2017-12-27 NOTE — Other (Signed)
Bedside and Verbal shift change report given to Yetta Barre, LPN   (oncoming nurse) by Linna Darner  (offgoing nurse). Report included the following information SBAR, Kardex and Cardiac Rhythm NSR .

## 2017-12-27 NOTE — Progress Notes (Signed)
Neurology Note    History [name] Wu presents with the history of having left side paresthesias and speech difficulties which resolved, started on last Monday. She is also complaining of blurred and double vision.       Neurological Examination:    Mental status: Patient is alert and oriented times three, speech and language intact, repetition intact,Fund of knowledge and cognition intact.     Cranial Nerves:   I.   Not tested.  II.   Visual fields are full to confrontation, could not check fundus  III, IV, VI.     Both pupils equal and reactive, full conjugate movements, no nystagmus.  V.    Sensation on the face symmetrical and normal.  VII.                  No facial weakness.  VIII.   Hearing normal to finger rub bilaterally.  IX, X.   Voice and articulation are normal, palate movement symmetrical  XI.   Bilateral shoulder shrug strength normal.  XII.   Tongue normal in bulk and strength, no fasciculations    Motor:  Strength in bilateral upper extremities is 5/5. Strength in bilateral lower extremities is 5/5.  Tone and bulk normal. Gait normal.   Co-ordination:  Finger to nose normal. No dysmetria.  Sensory examination: Sensation to pin prick and temperature is bilaterally symmetrical and normal.   Deep tendon reflexes: DTRs are bilaterally 0. Bilateral toes downgoing.    IMPRESSION [name] Wu presents with the history of having left sided paresthesias and speech difficulties which resolved, and could be TIA. She does complain of double vision, which gets better closing one eye. It is possible that she might have small ischemia in 6th or 4th cranial nerve, seen commonly with diabetes.     There is full conjugate movement on examination at this time. There is no ptosis or any other focal weakness at this time. MRI brain showed small vessel changes but no acute changes. She just had LP done 3-4 days before her symptoms in [place] which did not show any malignant cells as per the patient.      Sometimes prograf can also cause neurotoxicity, which can present with diplopia. However, MRI brain was done without contrast    PLAN  - continue mx of stroke risk factors  - could consider aspirin if not contraindicated  - will get f.up MRI with contrast  - Pt wants [place] to call her oncologist in [place], will get details from Dr. Edd Arbour  - will defer LP as she just had that done  - will eventually need eval by ophthalmology as well          Medications:    Current Facility-Administered Medications:   ???  [START ON 12/28/2017] VANCOMYCIN TROUGH DUE, , Other, ONCE, Riles, Jr. Francene Boyers, MD  ???  sodium chloride (NS) flush 10 mL, 10 mL, InterCATHeter, DAILY, Josie Saunders, Anuj, MD, 10 mL at 12/26/17 1306  ???  sodium chloride (NS) flush 10-20 mL, 10-20 mL, InterCATHeter, PRN, Josie Saunders, Anuj, MD  ???  heparin (porcine) pf 50 Units, 50 Units, InterCATHeter, PRN, Josie Saunders, Anuj, MD  ???  heparin (porcine) pf 50 Units, 50 Units, InterCATHeter, DAILY, Sundra Aland, MD, 50 Units at 12/26/17 1304  ???  DULoxetine (CYMBALTA) capsule 20 mg, 20 mg, Oral, DAILY, Sundra Aland, MD, 20 mg at 12/26/17 2216  ???  magnesium oxide (MAG-OX) tablet 400 mg, 400 mg, Oral, BID, Sundra Aland, MD, 400 mg at 12/26/17  2209  ???  pantoprazole (PROTONIX) tablet 40 mg, 40 mg, Oral, DAILY, Sundra Aland, MD, 40 mg at 12/26/17 1748  ???  meropenem (MERREM) 500 mg in 0.9% sodium chloride (MBP/ADV) 50 mL MBP, 0.5 g, IntraVENous, Q8H, Einar Grad, MD, Last Rate: 16.7 mL/hr at 12/27/17 0941, 500 mg at 12/27/17 0941  ???  acyclovir (ZOVIRAX) tablet 400 mg, 400 mg, Oral, BID, Einar Grad, MD, 400 mg at 12/26/17 2209  ???  cholecalciferol (VITAMIN D3) tablet 5,000 Units, 5,000 Units, Oral, DAILY, Einar Grad, MD, 5,000 Units at 12/26/17 1511  ???  dextrose (D50) infusion 5-25 g, 10-50 mL, IntraVENous, PRN, Einar Grad, MD  ???  glucagon (GLUCAGEN) injection 1 mg, 1 mg, IntraMUSCular, PRN, Einar Grad, MD  ???  insulin glargine (LANTUS) injection 1-100 Units, 1-100 Units,  SubCUTAneous, QHS, Einar Grad, MD, 19 Units at 12/26/17 2203  ???  insulin lispro (HUMALOG) injection 1-100 Units, 1-100 Units, SubCUTAneous, AC&HS, Einar Grad, MD, Stopped at 12/26/17 2200  ???  insulin lispro (HUMALOG) injection 1-100 Units, 1-100 Units, SubCUTAneous, PRN, Einar Grad, MD, 2 Units at 12/26/17 2203  ???  oxyCODONE IR (ROXICODONE) tablet 10 mg, 10 mg, Oral, Q4H PRN, Einar Grad, MD, 10 mg at 12/26/17 1506  ???  potassium chloride (K-DUR, KLOR-CON) SR tablet 40 mEq, 40 mEq, Oral, BID, Einar Grad, MD, 40 mEq at 12/26/17 2208  ???  tacrolimus (PROGRAF) capsule 4 mg, 4 mg, Oral, BID, Einar Grad, MD, 4 mg at 12/26/17 2208  ???  prochlorperazine (COMPAZINE) tablet 10 mg, 10 mg, Oral, Q4H PRN, Einar Grad, MD, 10 mg at 12/25/17 1228  ???  zolpidem (AMBIEN) tablet 5 mg, 5 mg, Oral, QHS PRN, Einar Grad, MD, 5 mg at 12/26/17 2212  ???  trimethoprim-sulfamethoxazole (BACTRIM DS, SEPTRA DS) 160-800 mg per tablet 1 Tab, 1 Tab, Oral, Q MON, WED & Gara Kroner, MD, 1 Tab at 12/26/17 1512  ???  naloxone (NARCAN) injection 0.1 mg, 0.1 mg, IntraVENous, PRN, Einar Grad, MD  ???  acetaminophen (TYLENOL) tablet 650 mg, 650 mg, Oral, Q4H PRN **OR** acetaminophen (TYLENOL) solution 650 mg, 650 mg, Oral, Q4H PRN **OR** acetaminophen (TYLENOL) suppository 650 mg, 650 mg, Rectal, Q4H PRN, Einar Grad, MD  ???  nilotinib (TASIGNA) capsule cap 200 mg  (Patient Supplied), 200 mg, Oral, BID, Einar Grad, MD, 200 mg at 12/27/17 0951  ???  LORazepam (ATIVAN) tablet 1 mg, 1 mg, Oral, ON CALL, Einar Grad, MD  ???  budesonide (ENTOCORT EC) capsule 6 mg, 6 mg, Oral, TID, Dawson Bills, Muhamed, MD, 6 mg at 12/26/17 2208  ???  vancomycin (VANCOCIN) 1000 mg in NS 250 ml infusion, 1,000 mg, IntraVENous, Q12H, Einar Grad, MD, Last Rate: 250 mL/hr at 12/27/17 0112, 1,000 mg at 12/27/17 0112  ???  *Pharmacy dosing vancomycin, 1 Each, Other, Rx Dosing/Monitoring, Einar Grad, MD    Vitals  Patient Vitals for the past 24 hrs:    Temp Pulse Resp BP SpO2   12/27/17 0730 98.6 ??F (37 ??C) 89 18 109/66 100 %   12/27/17 0419 97.4 ??F (36.3 ??C) 95 16 117/70 99 %   12/27/17 0017 98.4 ??F (36.9 ??C) 99 18 120/70 100 %   12/26/17 2026 98.5 ??F (36.9 ??C) 90 16 112/65 99 %   12/26/17 1600 98.6 ??F (37 ??C) 94 13 132/79 100 %   12/26/17 1109 97.7 ??F (36.5 ??C) 94 14 138/76 100 %         Imaging  Cta Head    Result Date:  12/26/2017  CTA OF THE HEAD INDICATION: Nausea, sweating, vomiting, diarrhea COMPARISON: MRI 12/26/2017, CT 12/24/2017 TECHNIQUE: Noncontrast CT the head performed followed by CTA of the head with intravenous contrast. Multiplanar two-dimensional and three-dimensional maximum intensity projection reformats performed and reviewed. DICOM format image data is available to non-affiliated external healthcare facilities or entities on a secure, media free, reciprocally searchable basis with patient authorization for 12 months following the date of the study. FINDINGS: Noncontrast CT of the head demonstrates no acute segmental infarct, intracranial hemorrhage, mass, midline shift, extra-axial fluid collection, or hydrocephalus. The vertebral arteries are codominant. There is no focal hemodynamically significant stenosis within the anterior or posterior circulation. The anterior and posterior communicating arteries appear patent. There is no definitive aneurysm.     IMPRESSION: No focal hemodynamically significant stenosis.     Cta Neck    Result Date: 12/26/2017  CTA OF THE NECK WITH CONTRAST INDICATION: Speech difficulty, and balance COMPARISON: No relevant studies. TECHNIQUE: CTA of the neck was performed with nonionic intravenous contrast with multiplanar 2-dimensional and maximum intensity projection three-dimensional images. Stenosis was evaluated using the NASCET criteria. DICOM format image data is available to non-affiliated external healthcare facilities or entities on a secure,  media free, reciprocally searchable basis with patient authorization for 12 months following the date of the study. FINDINGS: There is no hemodynamically significant stenosis within the carotid or vertebral arteries. There is no evidence of dissection. CAROTID STENOSIS REFERENCE USING NASCET CRITERIA % Stenosis = (1 - narrowest diameter/diameter of distal artery) x100 Mild: < 50% stenosis Moderate: 50-69% stenosis. Severe: 70-94% stenosis. Near occlusion: 95-99% stenosis. Occluded: 100% stenosis     IMPRESSION: No focal hemodynamically significant stenosis.      Echo Results  (Last 48 hours)               12/26/17 1341  2D ECHO COMPLETE ADULT WO CONTRAST Final result    Narrative:                                                                Study ID:    [id number]                                                   [facility]                                         [address]                                                 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    The study was technically adequate.       Small left ventricle, norla wall thickness with normal systolic function and    a   calculated ejectio fraction of 77%.   No regional wall motion abnormalites are noted.   Grade 1 diastolic dysfunction.   Normal size right ventricle, normal systolic function.   No hemodynamic valvular pathology or signficiant regurgitation.   Estimated right ventricular systolic pressure: 56OZHY.   Negative bubble study for interatrial shunting.   Other findings are noted below.       Prior echo was done 04/25/2017 the EF was reported at 63%. Grade 1 diastolic   dysfunction. RVSP 32mmHg.           _____________________________________________________________________________   __           Left Ventricle   The left ventricular cavity is small. There is normal left ventricular wall   thickness. Left ventricular systolic function is normal. The calculated left   ventricular ejection fraction is 77%. Based on the transmitral spectral   Doppler flow pattern the diastolic function is Grade 1. No regional wall   motion abnormalities noted.       Right Ventricle   The right ventricle is normal size. Right ventricular systolic function is   normal: TAPSE: 2.1cm. Tissue Doppler Interrogation of RV S' wave correlates   with TAPSE.       Atria   Left atrial  volume index: 89ml/m2. Right atrial volume index 37ml/m2.    Injection   of Saline Contrast was negative for interatrial shunting.           Mitral Valve   The mitral valve leaflets appear normal. There is no evidence of stenosis,   fluttering, or prolapse. There is no evidence of mitral valve prolapse. There   is no mitral regurgitation noted.       Tricuspid Valve   The tricuspid valve is normal. There is no tricuspid valve prolapse.    Tricuspid   regurgitation is not well seen with Colorflow Doppler. Tricuspid    regurgitation   peak flow velocity is documented at 2.2 meters per second. Based on the peak    tricuspid regurgitation velocity the estimated right ventricular systolic   pressure is 23 mmHg.       Aortic Valve   The aortic valve is normal in structure and function. No aortic regurgitation   is present.       Pulmonic Valve   The pulmonic valve leaflets are thin and pliable; valve motion is normal.   Trivial pulmonic valvular regurgitation.       Great Vessels   The aortic root is normal size.           Pericardium/Pleural   There is no pericardial effusion.       MEASUREMENTS/CALCULATIONS:       MMode/2D Measurements & Calculations   RVDd: 2.6 cm                           LVIDd: 3.6 cm   IVSd: 1.0 cm                           LVIDs: 2.3 cm                                          LVPWd: 0.99 cm           _______________________________________________________________       FS: 35.6 %                             Ao root diam: 3.4 cm                                          Ao root area: 8.8 cm2               _______________________________________________________________   TAPSE: 2.1 cm                                          RA A4Cs: 9.3 cm2           _______________________________________________________________   LA ESV (BP): 19.4 ml                                          LA ESV Index (BP): 11.5 ml/m2           _______________________________________________________________  RA ESV Index (A4C): 9.3 ml/m2       Doppler Measurements & Calculations   MV A dur: 0.09 sec                      MV dec time: 0.27 sec   MV E max vel: 74.1 cm/sec   MV A max vel: 83.3 cm/sec   MV E/A: 0.89   LV IVRT: 0.09 sec               _______________________________________________________________   Lat Peak E' Vel: 8.0 cm/sec             Med Peak E' Vel: 4.9 cm/sec               _______________________________________________________________   Ao V2 max: 158.6 cm/sec                 LV V1 max PG: 6.6 mmHg   Ao max PG: 10.1 mmHg                    LV V1 max: 128.6 cm/sec            _______________________________________________________________       TR max vel: 222.4 cm/sec                RAP systole: 3.0 mmHg   TR max PG: 19.8 mmHg   RVSP(TR): 22.8 mmHg           _______________________________________________________________       Fatima Sanger A Revs Vel: 32.8 cm/sec            AVA Dim Index: 0.81   Pulm A Revs Dur: 0.10 sec           _______________________________________________________________       E/E' lat: 9.3                           E/E' med: 15.2           Electronically signed byDr. Loyal Buba. Herma Mering., MD   12/26/2017 01:41                           PM               Labs  No results found for this visit on 12/24/17 (from the past 12 hour(s)).  Lab Results   Component Value Date/Time    Cholesterol, total 174 12/26/2017 04:40 AM    HDL Cholesterol 57 12/26/2017 04:40 AM    LDL, calculated 88 12/26/2017 04:40 AM    Triglyceride 145 12/26/2017 04:40 AM    CHOL/HDL Ratio 3.1 12/26/2017 04:40 AM           Delora Fuel, MD

## 2017-12-27 NOTE — Progress Notes (Signed)
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 Hart Carwin [phone]  -on tassigra comp GVH skin, esophagus   -bactrim tiw, acyclovir prophylaxis  -pancytopenia ch thrombocytopenia, not neutropenic (ANC 8/15 1800)    L numbness, weakness n/v vision change diplopia began Monday 8/12- CTH neg   DM on insulin    HTN             PCN allergy       Rec:   ->continue iv vancomycin  ->stop iv meropenem   -> d/c picc line and culture tip -- pt asked me to call [place] [facility] [name] physicians before agreeing; I d/w on-call physician Dr. Keene Breath [phone] who agreed with this; pt called him in my presence to confirm   ->repeat bctx's x 2 in am to confirm clearance of bacteremia.  If neg after 2-3 days may replace picc if needed soon; if persistent bsi would likely need TEE.    ->await repeat MRI and defer w/u for diplopia to Neurology, appears holding on LP for now   -d/w Dr. Ceasar Lund     MICROBIOLOGY:   8/14 bctx x 2 MRSE   uctx ngtd  8/16 bctx x 2 GPC cl    LINES AND CATHETERS:   PICC    Active Hospital Problems    Diagnosis Date Noted   ??? Bacteremia 12/26/2017   ??? TIA (transient ischemic attack) 12/25/2017         Subjective:     Interval notes reviewed. D/w pt repeat bctx's before abx also growing GPC cl, prior cultures were MRSE, suspect early picc line infection but with high grade BSI and immunosuppressed patient rec removal over attempt to  salvage here.  She refused picc removal until after I called her transplant physicians, d/w Dr. Keene Breath who agreed with plan, and patient called him while I was in room to confirm what I said.  D/w Dr. Ceasar Lund today and will plan line holiday and repeat bctx's in am    Current Facility-Administered Medications   Medication Dose Route Frequency   ??? [START ON 12/28/2017] VANCOMYCIN TROUGH DUE   Other ONCE   ??? sodium chloride (NS) flush 10 mL  10 mL InterCATHeter DAILY   ??? sodium chloride (NS) flush 10-20 mL  10-20 mL InterCATHeter PRN   ??? heparin (porcine) pf 50 Units  50 Units InterCATHeter PRN   ??? heparin (porcine) pf 50 Units  50 Units InterCATHeter DAILY   ??? DULoxetine (CYMBALTA) capsule 20 mg  20 mg Oral DAILY   ??? magnesium oxide (MAG-OX) tablet 400 mg  400 mg Oral BID   ??? pantoprazole (PROTONIX) tablet 40 mg  40 mg Oral DAILY   ??? meropenem (MERREM) 500 mg in 0.9% sodium chloride (MBP/ADV) 50 mL MBP  0.5 g IntraVENous Q8H   ??? acyclovir (ZOVIRAX) tablet 400 mg  400 mg Oral  BID   ??? cholecalciferol (VITAMIN D3) tablet 5,000 Units  5,000 Units Oral DAILY   ??? dextrose (D50) infusion 5-25 g  10-50 mL IntraVENous PRN   ??? glucagon (GLUCAGEN) injection 1 mg  1 mg IntraMUSCular PRN   ??? insulin glargine (LANTUS) injection 1-100 Units  1-100 Units SubCUTAneous QHS   ??? insulin lispro (HUMALOG) injection 1-100 Units  1-100 Units SubCUTAneous AC&HS   ??? insulin lispro (HUMALOG) injection 1-100 Units  1-100 Units SubCUTAneous PRN   ??? oxyCODONE IR (ROXICODONE) tablet 10 mg  10 mg Oral Q4H PRN   ??? potassium chloride (K-DUR, KLOR-CON) SR tablet 40 mEq  40 mEq Oral BID   ??? tacrolimus (PROGRAF) capsule 4 mg  4 mg Oral BID   ??? prochlorperazine (COMPAZINE) tablet 10 mg  10 mg Oral Q4H PRN   ??? zolpidem (AMBIEN) tablet 5 mg  5 mg Oral QHS PRN   ??? trimethoprim-sulfamethoxazole (BACTRIM DS, SEPTRA DS) 160-800 mg per tablet 1 Tab  1 Tab Oral Q MON, WED & FRI   ??? naloxone (NARCAN) injection 0.1 mg  0.1 mg IntraVENous PRN    ??? acetaminophen (TYLENOL) tablet 650 mg  650 mg Oral Q4H PRN    Or   ??? acetaminophen (TYLENOL) solution 650 mg  650 mg Oral Q4H PRN    Or   ??? acetaminophen (TYLENOL) suppository 650 mg  650 mg Rectal Q4H PRN   ??? nilotinib (TASIGNA) capsule cap 200 mg  (Patient Supplied)  200 mg Oral BID   ??? LORazepam (ATIVAN) tablet 1 mg  1 mg Oral ON CALL   ??? budesonide (ENTOCORT EC) capsule 6 mg  6 mg Oral TID   ??? vancomycin (VANCOCIN) 1000 mg in NS 250 ml infusion  1,000 mg IntraVENous Q12H   ??? *Pharmacy dosing vancomycin  1 Each Other Rx Dosing/Monitoring         Objective:     Visit Vitals  BP 109/69   Pulse 81   Temp 97.9 ??F (36.6 ??C)   Resp 18   Ht 5' (1.524 m)   Wt 66.3 kg (146 lb 2.6 oz)   SpO2 98%   BMI 22.22 kg/m??       Temp (24hrs), Avg:98.2 ??F (36.8 ??C), Min:97.4 ??F (36.3 ??C), Max:98.6 ??F (37 ??C)    GEN: WFWN BF appears stated age lying in bed NAD wearing mask  HEENT: anicteric sclerae moist orophyarynx  NECK: Supple   CHEST: no rales rhonchi or wheeze  CVS: S1'S2' no rub murmur or gallop  ABD: soft non-tender (+) BS no masses non-distended  EXT: no edema RUE picc in place     Labs: Results:   Chemistry Recent Labs     12/26/17  0440 12/24/17  2256 12/24/17  2230   GLU 207*  --  239*   NA 140  --  139   K 4.7  --  4.4   CL 108*  --  105   CO2 27  --  25   BUN 6*  --  10   CREA 0.7 0.7 0.8   CA 8.9  --  8.4*   AGAP 6  --  9   AP  --   --  96   TP  --   --  5.5*   ALB  --   --  3.3*      CBC w/Diff Recent Labs     12/26/17  0440 12/25/17  0010   WBC 3.6* 2.9*  RBC 2.83* 3.02*   HGB 9.8* 10.4*   HCT 29.0* 29.5*   PLT 53* 55*   GRANS  --  63.3   LYMPH  --  25.2*   EOS  --  1.0        Mri brain 8/16 IMPRESSION:  1.   Nonspecific white matter hyperintensities likely due to chronic  microvascular ischemic changes. Iatrogenic drug toxicity, graft-versus-host,  inflammatory/infectious, hypertension, demyelinating process, trauma, chronic  headaches, sequelae of vasculitis are other considerations.   2. Low-lying cerebellar tonsils.  3. No orbital masses. No infarction    Echo 8/16 Interpretation Summary  A complete two-dimensional transthoracic echocardiogram was performed (2D, M-  mode, Doppler and color flow Doppler).  The study was technically adequate.    Small left ventricle, norla wall thickness with normal systolic function and   a  calculated ejectio fraction of 77%.  No regional wall motion abnormalites are noted.  Grade 1 diastolic dysfunction.  Normal size right ventricle, normal systolic function.  No hemodynamic valvular pathology or signficiant regurgitation.  Estimated right ventricular systolic pressure: [id number].  Negative bubble study for interatrial shunting.    Microbiology Results  No results for input(s): SDES, CULT in the last 72 hours.    Estelle Grumbles, MD  December 27, 2017  [facility]  [phone]

## 2017-12-27 NOTE — Progress Notes (Signed)
Hospitalist Progress Note    Patient: [name] Wu MRN: [id number]  CSN: [id number]    Date of Birth: Dec 09, 1960  Age: 57 y.o.  Sex: female    DOA: 12/24/2017 LOS:  LOS: 1 day          Chief Complaint:   Chief Complaint   Patient presents with   ??? Nausea   ??? Double Vision   ??? Diarrhea       Assessment/Plan     1. Diplopia with transient slurred speech, left upper extremity weakness, gait disturbances. ??Concern for TIA/CVA, consider meningeal malignancy  2. Bacteremia, GPC clusters x 2 bottles, concern for blood stream infection  3. 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  5. SIRS  6. 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 Pancytopenia secondary to above  8. Insulin-dependent type 2 diabetes  9. Chronic pain    Patient Active Problem List   Diagnosis Code   ??? Chest pressure R07.89   ??? Elevated troponin R74.8   ??? Thrombocytopenia ([place]) D69.6   ??? Neutropenic fever ([facility]) D70.9, R50.81   ??? TIA (transient ischemic attack) G45.9   ??? Bacteremia R78.81       Subjective: 57 yo female patient with ALL who has been followed locally by Dr. Edd Arbour and overall by [facility] [place] for care who was admitted for evaluation of diplopia and slurred speech.  Appreciate all consultants and will follow.  At present pt's diplopia has cleared and she notes that as soon as the PICC line came out that there started to be an improvement.  Her appetite and general well being has improved as well.        Review of systems:   []  Unable to obtain  ROS due to  [] mental status change  [] sedated   [] intubated    Constitutional: No fever, chills, or weight loss  Eyes: No visual symptoms.  ENT: No sore throat, runny nose or ear pain.  Respiratory: No cough, dyspnea or wheezing.   Cardiovascular: No chest pain, pressure, palpitations, tightness or heaviness.  Gastrointestinal: No vomiting, diarrhea or abdominal pain.  Genitourinary: No dysuria, frequency, or urgency.  Musculoskeletal: No joint pain or swelling.  Integumentary: No rashes.  Neurological: No headaches, sensory or motor symptoms.    Vital signs/Intake and Output:  Visit Vitals  BP 132/82   Pulse 90   Temp 98.1 ??F (36.7 ??C)   Resp 20   Ht 5' (1.524 m)   Wt 66.3 kg (146 lb 2.6 oz)   SpO2 100%   BMI 22.22 kg/m??     Current Shift:  No intake/output data recorded.  Last three shifts:  08/16 0701 - 08/17 1900  In: 9509 [P.O.:840; I.V.:850]  Out: -     Exam:    General:  Alert, cooperative, no distress, appears stated age.   Head:  Normocephalic, without obvious abnormality, atraumatic.   Eyes:  Conjunctivae/corneas clear. PERRL, EOMs intact. Fundi benign       Nose: Nares normal. Septum midline. Mucosa normal. No drainage or sinus tenderness.   Throat: Lips, mucosa, and tongue normal.    Neck: Supple, symmetrical, trachea midline, no adenopathy, thyroid: no enlargement/tenderness/nodules, no carotid bruit and no JVD.   Back:   Symmetric, no curvature. ROM normal. No CVA tenderness.   Lungs:  Clear to auscultation bilaterally.   Chest wall:  No tenderness or deformity.   Heart:  Regular rate and rhythm, S1, S2 normal, no murmur, click, rub or gallop.   Abdomen:   Soft, non-tender. Bowel sounds normal. No masses,  No organomegaly.           Extremities: Extremities normal, atraumatic, no cyanosis or edema.       Skin: Skin color, texture, turgor normal. No rashes or lesions       Neurologic: CNII-XII intact. Normal strength, sensation  throughout.             Labs:    Sodium   Date Value Ref Range Status   12/26/2017 140 136 - 145 mEq/L Final     Potassium   Date Value Ref Range Status   12/26/2017 4.7 3.5 - 5.1 mEq/L Final     BUN   Date Value Ref Range Status   12/26/2017 6 (L) 7 - 25 mg/dl Final     Calcium    Date Value Ref Range Status   12/26/2017 8.9 8.5 - 10.1 mg/dl Final     Chloride   Date Value Ref Range Status   12/26/2017 108 (H) 98 - 107 mEq/L Final     CO2   Date Value Ref Range Status   12/26/2017 27 21 - 32 mEq/L Final     Glucose   Date Value Ref Range Status   12/26/2017 207 (H) 74 - 106 mg/dl Final     HGB   Date Value Ref Range Status   12/26/2017 9.8 (L) 13.0 - 17.2 gm/dl Final     HCT   Date Value Ref Range Status   12/26/2017 29.0 (L) 37.0 - 50.0 % Final     WBC   Date Value Ref Range Status   12/26/2017 3.6 (L) 4.0 - 11.0 1000/mm3 Final     PLATELET   Date Value Ref Range Status   12/26/2017 53 (L) 140 - 450 1000/mm3 Final         Procedures/imaging: see electronic medical records for all procedures/Xrays and details which were not copied into this note but were reviewed prior to creation of Plan      Jr. Westley Chandler, MD  December 27, 2017

## 2017-12-28 LAB — CULTURE, BLOOD

## 2017-12-28 LAB — GLUCOSE, POC
Glucose (POC): 259 mg/dL — ABNORMAL HIGH (ref 65–105)
Glucose (POC): 164 mg/dL — ABNORMAL HIGH (ref 65–105)
Glucose (POC): 268 mg/dL — ABNORMAL HIGH (ref 65–105)

## 2017-12-28 LAB — VANCOMYCIN, TROUGH: Vancomycin,trough: 12.3 ug/mL — ABNORMAL LOW (ref 15.0–20.0)

## 2017-12-28 LAB — CREATININE
Creatinine: 0.8 mg/dl (ref 0.6–1.3)
Creatinine: 0.8 mg/dl (ref 0.6–1.3)
GFR African American: >60
GFR est AA: >60
GFR est non-AA: >60
eGFR NON-AA: >60

## 2017-12-28 LAB — CULTURE, BLOOD 1

## 2017-12-28 LAB — VANCOMYCIN TROUGH: Vancomycin Tr: 12.3 ug/mL — ABNORMAL LOW (ref 15.0–20.0)

## 2017-12-28 LAB — POCT GLUCOSE
POC Glucose: 259 mg/dL — ABNORMAL HIGH (ref 65–105)
POC Glucose: 164 mg/dL — ABNORMAL HIGH (ref 65–105)
POC Glucose: 268 mg/dL — ABNORMAL HIGH (ref 65–105)

## 2017-12-28 MED ORDER — VANCOMYCIN 10 GRAM IV SOLR
10 gram | Freq: Two times a day (BID) | INTRAVENOUS | Status: DC
Start: 2017-12-28 — End: 2017-12-31
  Administered 2017-12-29 – 2017-12-31 (×4): via INTRAVENOUS

## 2017-12-28 MED FILL — TACROLIMUS 1 MG CAP: 1 mg | ORAL | Qty: 4

## 2017-12-28 MED FILL — VANCOMYCIN IN 0.9 % SODIUM CHLORIDE 1 GRAM/250 ML IV: 1 gram/250 mL | INTRAVENOUS | Qty: 250

## 2017-12-28 MED FILL — MAGNESIUM OXIDE 400 MG TAB: 400 mg | ORAL | Qty: 1

## 2017-12-28 MED FILL — ACYCLOVIR 800 MG TAB: 800 mg | ORAL | Qty: 1

## 2017-12-28 MED FILL — POTASSIUM CHLORIDE SR 20 MEQ TAB, PARTICLES/CRYSTALS: 20 mEq | ORAL | Qty: 2

## 2017-12-28 MED FILL — BUDESONIDE SR 3 MG 24 HR CAP: 3 mg | ORAL | Qty: 2

## 2017-12-28 MED FILL — DULOXETINE 20 MG CAP, DELAYED RELEASE: 20 mg | ORAL | Qty: 1

## 2017-12-28 MED FILL — CHOLECALCIFEROL (VITAMIN D3) 1,000 UNIT (25 MCG) TAB: ORAL | Qty: 5

## 2017-12-28 MED FILL — ZOLPIDEM 5 MG TAB: 5 mg | ORAL | Qty: 1

## 2017-12-28 MED FILL — VANCOMYCIN 10 GRAM IV SOLR: 10 gram | INTRAVENOUS | Qty: 1250

## 2017-12-28 MED FILL — PANTOPRAZOLE 40 MG TAB, DELAYED RELEASE: 40 mg | ORAL | Qty: 1

## 2017-12-28 MED FILL — BD POSIFLUSH NORMAL SALINE 0.9 % INJECTION SYRINGE: INTRAMUSCULAR | Qty: 10

## 2017-12-28 NOTE — Progress Notes (Signed)
PHARMACY VANCOMYCIN DOSING  SCR=0.8  VANCO TROUGH =12.3 (GOAL 15-20)  PLAN TO CHANGE VANCO 1250 MG IV Q 12 HOURS  NEXT TROUGH DUE 8/20  1200

## 2017-12-28 NOTE — Progress Notes (Signed)
Hospitalist Progress Note    Patient: [name] Wu MRN: [id number]  CSN: [id number]    Date of Birth: 12/29/1960  Age: 57 y.o.  Sex: female    DOA: 12/24/2017 LOS:  LOS: 2 days          Chief Complaint:   Chief Complaint   Patient presents with   ??? Nausea   ??? Double Vision   ??? Diarrhea       Assessment/Plan     1. Diplopia with transient slurred speech, left upper extremity weakness, gait disturbances. ??Concern for TIA.  This has abated completely so there is no blurred vision.  Concern about the use of ASA as a medication for pt. Bone marrow transplant. Pt. Desires [place] to call her specialist in [place], Monday.  2. Bacteremia, GPC clusters x 2 bottles, concern for blood stream infection. Pending results of cultures and PICC line tip.  3. 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  5. SIRS  6. 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  Appetite has improved.  7. Pancytopenia secondary to above  8. Insulin-dependent type 2 diabetes  9. Chronic pain    Patient Active Problem List   Diagnosis Code   ??? Chest pressure R07.89   ??? Elevated troponin R74.8   ??? Thrombocytopenia ([place]) D69.6   ??? Neutropenic fever ([facility]) D70.9, R50.81   ??? TIA (transient ischemic attack) G45.9   ??? Bacteremia R78.81       Subjective: Pt. Feels well today at the present time and whereas yesterday the diplopia improved there was a blurred vision as well that also improved. Appreciate specialists notes and comments.        Review of systems:   []  Unable to obtain  ROS due to  [] mental status change  [] sedated   [] intubated    Constitutional: No fever, chills, or weight loss  Eyes: No visual symptoms.  ENT: No sore throat, runny nose or ear pain.  Respiratory: No cough, dyspnea or wheezing.  Cardiovascular: No chest pain, pressure,  palpitations, tightness or heaviness.  Gastrointestinal: No vomiting, diarrhea or abdominal pain.  Genitourinary: No dysuria, frequency, or urgency.  Musculoskeletal: No joint pain or swelling.  Integumentary: No rashes.  Neurological: No headaches, sensory or motor symptoms.    Vital signs/Intake and Output:  Visit Vitals  BP 116/70 (BP 1 Location: Right arm, BP Patient Position: Sitting)   Pulse 85   Temp 99.4 ??F (37.4 ??C)   Resp 16   Ht 5' (1.524 m)   Wt 67.5 kg (148 lb 13 oz)   SpO2 100%   BMI 22.63 kg/m??     Current Shift:  08/18 0701 - 08/18 1900  In: 360 [P.O.:360]  Out: -   Last three shifts:  08/16 1901 - 08/18 0700  In: 1330 [P.O.:480; I.V.:850]  Out: -     Exam:    General:  Alert, cooperative, no distress, appears stated age.   Head:  Normocephalic, without obvious abnormality, atraumatic.   Eyes:  Conjunctivae/corneas clear. PERRL, EOMs intact.                    Back:   Symmetric, no curvature. ROM normal. No CVA tenderness.   Lungs:   Clear to auscultation bilaterally.   Chest wall:  No tenderness or deformity.   Heart:  Regular rate and rhythm, S1, S2 normal, no murmur, click, rub or gallop.               Extremities: Extremities normal, atraumatic, no cyanosis or edema.   Pulses: 2+ and symmetric all extremities.   Skin: Skin color, texture, turgor normal. No rashes or lesions   Lymph nodes: Cervical, supraclavicular, and axillary nodes normal.   Neurologic: CNII-XII intact. Normal strength, sensation  throughout.             Labs:    Sodium   Date Value Ref Range Status   12/26/2017 140 136 - 145 mEq/L Final     Potassium   Date Value Ref Range Status   12/26/2017 4.7 3.5 - 5.1 mEq/L Final     BUN   Date Value Ref Range Status   12/26/2017 6 (L) 7 - 25 mg/dl Final     Calcium   Date Value Ref Range Status   12/26/2017 8.9 8.5 - 10.1 mg/dl Final     Chloride   Date Value Ref Range Status   12/26/2017 108 (H) 98 - 107 mEq/L Final     CO2   Date Value Ref Range Status   12/26/2017 27 21 - 32 mEq/L  Final     Glucose   Date Value Ref Range Status   12/26/2017 207 (H) 74 - 106 mg/dl Final     HGB   Date Value Ref Range Status   12/26/2017 9.8 (L) 13.0 - 17.2 gm/dl Final     HCT   Date Value Ref Range Status   12/26/2017 29.0 (L) 37.0 - 50.0 % Final     WBC   Date Value Ref Range Status   12/26/2017 3.6 (L) 4.0 - 11.0 1000/mm3 Final     PLATELET   Date Value Ref Range Status   12/26/2017 53 (L) 140 - 450 1000/mm3 Final         Procedures/imaging: see electronic medical records for all procedures/Xrays and details which were not copied into this note but were reviewed prior to creation of Plan      Jr. Westley Chandler, MD  December 28, 2017

## 2017-12-28 NOTE — Progress Notes (Signed)
Neurology Note    History [name] Wu presents with the history of having left side paresthesias and speech difficulties which resolved, started on last Monday. She is also complaining of blurred and double vision.       Neurological Examination:    Mental status: Patient is alert and oriented times three, speech and language intact, repetition intact,Fund of knowledge and cognition intact.     Cranial Nerves:   I.   Not tested.  II.   Visual fields are full to confrontation, could not check fundus  III, IV, VI.     Both pupils equal and reactive, full conjugate movements, no nystagmus.  V.    Sensation on the face symmetrical and normal.  VII.                  No facial weakness.  VIII.   Hearing normal to finger rub bilaterally.  IX, X.   Voice and articulation are normal, palate movement symmetrical  XI.   Bilateral shoulder shrug strength normal.  XII.   Tongue normal in bulk and strength, no fasciculations    Motor:  Strength in bilateral upper extremities is 5/5. Strength in bilateral lower extremities is 5/5.  Tone and bulk normal. Gait normal.   Co-ordination:  Finger to nose normal. No dysmetria.  Sensory examination: Sensation to pin prick and temperature is bilaterally symmetrical and normal.   Deep tendon reflexes: DTRs are bilaterally 0. Bilateral toes downgoing.    IMPRESSION [name] presents with the history of having left sided paresthesias and speech difficulties which resolved, and could be TIA. She does complain of double vision, which gets better closing one eye. It is possible that she might have small ischemia in 6th or 4th cranial nerve, seen commonly with diabetes.     There is full conjugate movement on examination at this time. There is no ptosis or any other focal weakness at this time. MRI brain showed small vessel changes but no acute changes. She just had LP done 3-4 days before her symptoms in [place] which did not show any malignant cells as per the patient.     Sometimes prograf can  also cause neurotoxicity, which can present with diplopia. Hence MRI brain with contrast was repeated which was unremarkable. The patient today feels that her diplopia has improved and is feeling better.     PLAN  - continue mx of stroke risk factors  - could consider aspirin if not contraindicated  - will defer LP as she just had that done  - will eventually need eval by ophthalmology as well  - cont mx of stroke risk factors  - can f.up with her oncologist as outpt        Medications:    Current Facility-Administered Medications:   ???  VANCOMYCIN TROUGH DUE, , Other, ONCE, Riles, Jr. Francene Boyers, MD  ???  sodium chloride (NS) flush 10 mL, 10 mL, InterCATHeter, DAILY, Josie Saunders, Anuj, MD, 10 mL at 12/28/17 1023  ???  sodium chloride (NS) flush 10-20 mL, 10-20 mL, InterCATHeter, PRN, Josie Saunders, Anuj, MD  ???  heparin (porcine) pf 50 Units, 50 Units, InterCATHeter, PRN, Josie Saunders, Anuj, MD  ???  heparin (porcine) pf 50 Units, 50 Units, InterCATHeter, DAILY, Sundra Aland, MD, Stopped at 12/28/17 0900  ???  DULoxetine (CYMBALTA) capsule 20 mg, 20 mg, Oral, DAILY, Sundra Aland, MD, 20 mg at 12/27/17 2305  ???  magnesium oxide (MAG-OX) tablet 400 mg, 400 mg, Oral, BID, Sundra Aland, MD, 400  mg at 12/28/17 1019  ???  pantoprazole (PROTONIX) tablet 40 mg, 40 mg, Oral, DAILY, Josie Saunders, Anuj, MD, 40 mg at 12/28/17 1020  ???  acyclovir (ZOVIRAX) tablet 400 mg, 400 mg, Oral, BID, Einar Grad, MD, 400 mg at 12/28/17 1020  ???  cholecalciferol (VITAMIN D3) tablet 5,000 Units, 5,000 Units, Oral, DAILY, Einar Grad, MD, 5,000 Units at 12/28/17 1020  ???  dextrose (D50) infusion 5-25 g, 10-50 mL, IntraVENous, PRN, Einar Grad, MD  ???  glucagon (GLUCAGEN) injection 1 mg, 1 mg, IntraMUSCular, PRN, Einar Grad, MD  ???  insulin glargine (LANTUS) injection 1-100 Units, 1-100 Units, SubCUTAneous, QHS, Einar Grad, MD, 23 Units at 12/27/17 2317  ???  insulin lispro (HUMALOG) injection 1-100 Units, 1-100 Units, SubCUTAneous, AC&HS, Einar Grad, MD, 3 Units at 12/27/17  2317  ???  insulin lispro (HUMALOG) injection 1-100 Units, 1-100 Units, SubCUTAneous, PRN, Einar Grad, MD, 2 Units at 12/26/17 2203  ???  oxyCODONE IR (ROXICODONE) tablet 10 mg, 10 mg, Oral, Q4H PRN, Einar Grad, MD, 10 mg at 12/26/17 1506  ???  potassium chloride (K-DUR, KLOR-CON) SR tablet 40 mEq, 40 mEq, Oral, BID, Einar Grad, MD, 40 mEq at 12/28/17 1020  ???  tacrolimus (PROGRAF) capsule 4 mg, 4 mg, Oral, BID, Einar Grad, MD, 4 mg at 12/28/17 1020  ???  prochlorperazine (COMPAZINE) tablet 10 mg, 10 mg, Oral, Q4H PRN, Einar Grad, MD, 10 mg at 12/25/17 1228  ???  zolpidem (AMBIEN) tablet 5 mg, 5 mg, Oral, QHS PRN, Einar Grad, MD, 5 mg at 12/27/17 2304  ???  trimethoprim-sulfamethoxazole (BACTRIM DS, SEPTRA DS) 160-800 mg per tablet 1 Tab, 1 Tab, Oral, Q MON, WED & Gara Kroner, MD, 1 Tab at 12/26/17 1512  ???  naloxone (NARCAN) injection 0.1 mg, 0.1 mg, IntraVENous, PRN, Einar Grad, MD  ???  acetaminophen (TYLENOL) tablet 650 mg, 650 mg, Oral, Q4H PRN **OR** acetaminophen (TYLENOL) solution 650 mg, 650 mg, Oral, Q4H PRN **OR** acetaminophen (TYLENOL) suppository 650 mg, 650 mg, Rectal, Q4H PRN, Einar Grad, MD  ???  nilotinib (TASIGNA) capsule cap 200 mg  (Patient Supplied), 200 mg, Oral, BID, Einar Grad, MD, 200 mg at 12/28/17 1018  ???  LORazepam (ATIVAN) tablet 1 mg, 1 mg, Oral, ON CALL, Einar Grad, MD  ???  budesonide (ENTOCORT EC) capsule 6 mg, 6 mg, Oral, TID, Jasarevic, Muhamed, MD, 6 mg at 12/28/17 1019  ???  vancomycin (VANCOCIN) 1000 mg in NS 250 ml infusion, 1,000 mg, IntraVENous, Q12H, Einar Grad, MD, Last Rate: 250 mL/hr at 12/28/17 0105, 1,000 mg at 12/28/17 0105  ???  *Pharmacy dosing vancomycin, 1 Each, Other, Rx Dosing/Monitoring, Einar Grad, MD    Vitals  Patient Vitals for the past 24 hrs:   Temp Pulse Resp BP SpO2   12/28/17 0828 97.9 ??F (36.6 ??C) 76 18 115/70 100 %   12/28/17 0355 98.4 ??F (36.9 ??C) 82 18 114/70 100 %   12/28/17 0003 99 ??F (37.2 ??C) 81 18 129/66 100 %   12/27/17 2057 98.5 ??F  (36.9 ??C) 89 18 131/77 100 %   12/27/17 1522 98.1 ??F (36.7 ??C) 90 20 132/82 100 %   12/27/17 1113 97.9 ??F (36.6 ??C) 81 18 109/69 98 %         Imaging  Mri Brain W Cont    Result Date: 12/27/2017  MRI OF THE BRAIN INDICATION: diplopia.    COMPARISON: Noncontrast MRI 12/26/2017 TECHNIQUE: Multiplanar, multisequence MRI of the brain was performed without intravenous contrast.  FINDINGS: The patient returned for contrast-enhanced imaging of the brain. Images are degraded by patient motion artifacts. However, there is no abnormal parenchymal, dural, or meningeal enhancement. No gross sellar mass.     IMPRESSION: 1.  No abnormal enhancement to suggest active infectious/inflammatory/neoplastic process      Echo Results  (Last 48 hours)               12/26/17 1341  2D ECHO COMPLETE ADULT WO CONTRAST Final result    Narrative:                                                                Study ID:    [facility]                                         [address]                                                 [place], [place]                                     Adult Echocardiogram Report       Name: CAROLANN, BRAZELL     Study Date: 12/26/2017 11:19 AM   MRN: [id number]              Patient Location: Q[facility]^[id number]   DOB: 1960-11-12           Age: 57 yrs   Height: 60 in             Weight: 144 lb  BSA: 1.6    m2                             HR: 93   Gender: Female            Account #: [id number]   Reason For Study: tia   History: htn   diabetic   lymphocytic leukemia/ s/p bone marrow transplant 08/2017   Ordering Physician: Einar Grad   Performed By: Ara Kussmaul, RDCS       Interpretation Summary   A complete  two-dimensional transthoracic echocardiogram was performed (2D, M-   mode, Doppler and color flow Doppler).   The study was technically adequate.       Small left ventricle, norla wall thickness with normal systolic function and    a   calculated ejectio fraction of 77%.   No regional wall motion abnormalites are noted.   Grade 1 diastolic dysfunction.   Normal size right ventricle, normal systolic function.   No hemodynamic valvular pathology or signficiant regurgitation.   Estimated right ventricular systolic pressure: [id number].   Negative bubble study for interatrial shunting.   Other findings are noted below.       Prior echo was done 04/25/2017 the EF was reported at 63%. Grade 1 diastolic   dysfunction. RVSP 18mmHg.           _____________________________________________________________________________   __           Left Ventricle   The left ventricular cavity is small. There is normal left ventricular wall   thickness. Left ventricular systolic function is normal. The calculated left   ventricular ejection fraction is 77%. Based on the transmitral spectral   Doppler flow pattern the diastolic function is Grade 1. No regional wall   motion abnormalities noted.       Right Ventricle   The right ventricle is normal size. Right ventricular systolic function is   normal: TAPSE: 2.1cm. Tissue Doppler Interrogation of RV S' wave correlates   with TAPSE.       Atria   Left atrial volume index: 71ml/m2. Right atrial volume index 68ml/m2.    Injection   of Saline Contrast was negative for interatrial shunting.           Mitral Valve   The mitral valve leaflets appear normal. There is no evidence of stenosis,   fluttering, or prolapse. There is no evidence of mitral valve prolapse. There   is no mitral regurgitation noted.       Tricuspid Valve   The tricuspid valve is normal. There is no tricuspid valve prolapse.    Tricuspid   regurgitation is not well seen with Colorflow Doppler. Tricuspid    regurgitation   peak flow  velocity is documented at 2.2 meters per second. Based on the peak   tricuspid regurgitation velocity the estimated right ventricular systolic   pressure is 23 mmHg.       Aortic Valve   The aortic valve is normal in structure and function. No aortic regurgitation   is present.       Pulmonic Valve   The pulmonic valve leaflets are thin and pliable; valve motion is normal.   Trivial pulmonic valvular regurgitation.       Great Vessels   The aortic root is normal size.           Pericardium/Pleural   There is no pericardial effusion.  MEASUREMENTS/CALCULATIONS:       MMode/2D Measurements & Calculations   RVDd: 2.6 cm                           LVIDd: 3.6 cm   IVSd: 1.0 cm                           LVIDs: 2.3 cm                                          LVPWd: 0.99 cm           _______________________________________________________________       FS: 35.6 %                             Ao root diam: 3.4 cm                                          Ao root area: 8.8 cm2               _______________________________________________________________   TAPSE: 2.1 cm                                          RA A4Cs: 9.3 cm2           _______________________________________________________________   LA ESV (BP): 19.4 ml                                          LA ESV Index (BP): 11.5 ml/m2           _______________________________________________________________       RA ESV Index (A4C): 9.3 ml/m2       Doppler Measurements & Calculations   MV A dur: 0.09 sec                      MV dec time: 0.27 sec   MV E max vel: 74.1 cm/sec   MV A max vel: 83.3 cm/sec   MV E/A: 0.89   LV IVRT: 0.09 sec               _______________________________________________________________   Lat Peak E' Vel: 8.0 cm/sec             Med Peak E' Vel: 4.9 cm/sec               _______________________________________________________________   Ao V2 max: 158.6 cm/sec                 LV V1 max PG: 6.6 mmHg   Ao max PG: 10.1 mmHg                    LV V1  max: 128.6 cm/sec           _______________________________________________________________       TR max vel: 222.4 cm/sec                RAP  systole: 3.0 mmHg   TR max PG: 19.8 mmHg   RVSP(TR): 22.8 mmHg           _______________________________________________________________       Fatima Sanger A Revs Vel: 32.8 cm/sec            AVA Dim Index: 0.81   Pulm A Revs Dur: 0.10 sec           _______________________________________________________________       E/E' lat: 9.3                           E/E' med: 15.2           Electronically signed byDr. Loyal Buba. Herma Mering., MD   12/26/2017 01:41                           PM               Labs  No results found for this visit on 12/24/17 (from the past 12 hour(s)).  Lab Results   Component Value Date/Time    Cholesterol, total 174 12/26/2017 04:40 AM    HDL Cholesterol 57 12/26/2017 04:40 AM    LDL, calculated 88 12/26/2017 04:40 AM    Triglyceride 145 12/26/2017 04:40 AM    CHOL/HDL Ratio 3.1 12/26/2017 04:40 AM           Delora Fuel, MD

## 2017-12-28 NOTE — Progress Notes (Signed)
Progress  Notes by [name], MD at 12/28/17 6578                Author: [name], MD  Service: ONCOLOGY  Author Type: Physician       Filed: 12/28/17 0824  Date of Service: 12/28/17 4696  Status: Signed          Editor: [name], MD (Physician)                     Hematology / Oncology Progress Note         Patient: [name] Wu  Gender:  female   MRN: [id number]      Date of Birth: 10/28/1960 Age: 57 y.o.    CSN: [id number]      LOS:  LOS: 2 days   Admit Date: 12/24/2017      PCP: Lacey Jensen, MD        Assessment:     Active Problems:     TIA (transient ischemic attack) (12/25/2017)        Bacteremia (12/26/2017)              Plan:     Diplopia and paresthesias.  Resolved.  More likely related to TIA/ischemia.  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  Spinal tap not indicated at this time.  Patient may go  on aspirin.   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   Continue with tasigna.     Subjective:     Patient feels better.  Diplopia and paresthesias have resolved.        Objective:        Visit Vitals      BP  114/70 (BP 1 Location: Left arm, BP Patient Position: Supine)     Pulse  82     Temp  98.4 ??F (36.9 ??C)     Resp  18     Ht  5' (1.524 m)     Wt  67.5 kg (148 lb 13 oz)     SpO2  100%        BMI  22.63 kg/m??                 Intake/Output Summary (Last 24 hours) at 12/28/2017 6644   Last data filed at 12/27/2017 2145     Gross per 24 hour        Intake  1330 ml        Output  --        Net  1330 ml             Review of Systems:     Constitutional: negative for fevers, chills, sweats. sl fatigue   Eyes: negative for visual disturbance, redness and icterus   Ears, Nose, Mouth, Throat, and Face: negative for tinnitus, epistaxis, sore mouth and hoarseness   Respiratory: negative for cough, sputum, hemoptysis, pleurisy/chest pain or wheezing   Cardiovascular: negative for chest pain, chest pressure/discomfort, palpitations, irregular heart beats, syncope,  paroxysmal nocturnal dyspnea   Gastrointestinal: negative for reflux symptoms, nausea, vomiting, change in bowel habits, melena, diarrhea, constipation and abdominal pain   Genitourinary:negative for dysuria, nocturia, urinary incontinence, hesitancy and hematuria   Integument: negative for rash, skin lesion(s) and pruritus   Hematologic/Lymphatic: negative for easy bruising, bleeding and lymphadenopathy   Musculoskeletal:negative for myalgias, arthralgias and bone pain   Neurological: negative for headaches, dizziness, seizures, paresthesia and weakness  Physical Assessment:     Constitutional: Alert, oriented, not in distress   Eyes: PERRLA, anicteric, no redness   Ears, nose, mouth, throat, and face: no palpable Lymph nodes, no mucositis, no thrush.   Respiratory: symmetrical expansion, no rales, no rhonchi, no wheezing.   Cardiovascular: S1S2, no pathologic murmur, no rub.   Gastrointestinal: soft, benign, non tender, no HSM, normal bowel sounds, no mass.   Integument: no rash, no petechiae, no ecchymosis.   Musculoskeletal: no edema, no cyanosis, no clubbing.   Neurological: intact, cranial nerves, no focal motor or sensory deficits.        Diagnostic test:          Basic Metabolic Profile     Recent Labs         12/26/17   0440      NA  140      K  4.7      CL  108*      CO2  27      BUN  6*      CREA  0.7      GLU  207*      CA  8.9      MG  1.4*                   CBC w/Diff       Recent Labs         12/26/17   0440      WBC  3.6*      HGB  9.8*      HCT  29.0*      PLT  53*           No results for input(s): BANDS, GRANS in the last 72 hours.      No lab exists for component: LYMPHS          CHEMISTRY     Lab Results      Component  Value  Date        ALB  3.3 (L)  12/24/2017        TP  5.5 (L)  12/24/2017        CBIL  0.1  12/24/2017        TBILI  0.4  12/24/2017        ALT  29  12/24/2017        SGOT  16  12/24/2017        AP  96  12/24/2017        LPSE  41 (L)  12/24/2017                    Coagulation     Lab Results      Component  Value  Date/Time        Prothrombin time  12.1  12/25/2017 03:18 AM        Prothrombin time  14.1 (H)  05/13/2017 08:33 AM        Prothrombin time  12.5  04/25/2017 07:04 AM        INR  1.0  12/25/2017 03:18 AM        INR  1.2 (H)  05/13/2017 08:33 AM        INR  1.1  04/25/2017 07:04 AM                  Current Medications:        Current Facility-Administered Medications:    ?  VANCOMYCIN TROUGH  DUE, , Other, ONCE, Riles, Jr. Francene Boyers, MD   ?  sodium chloride (NS) flush 10 mL, 10 mL, InterCATHeter, DAILY, Josie Saunders, Anuj, MD, 10 mL at 12/27/17 1129   ?  sodium chloride (NS) flush 10-20 mL, 10-20 mL, InterCATHeter, PRN, Sundra Aland, MD   ?  heparin (porcine) pf 50 Units, 50 Units, InterCATHeter, PRN, Sundra Aland, MD   ?  heparin (porcine) pf 50 Units, 50 Units, InterCATHeter, DAILY, Sundra Aland, MD, 50 Units at 12/27/17 1128   ?  DULoxetine (CYMBALTA) capsule 20 mg, 20 mg, Oral, DAILY, Josie Saunders, Anuj, MD, 20 mg at 12/27/17 2305   ?  magnesium oxide (MAG-OX) tablet 400 mg, 400 mg, Oral, BID, Sundra Aland, MD, 400 mg at 12/27/17 2209   ?  pantoprazole (PROTONIX) tablet 40 mg, 40 mg, Oral, DAILY, Josie Saunders, Anuj, MD, 40 mg at 12/27/17 1128   ?  acyclovir (ZOVIRAX) tablet 400 mg, 400 mg, Oral, BID, Einar Grad, MD, 400 mg at 12/27/17 2209   ?  cholecalciferol (VITAMIN D3) tablet 5,000 Units, 5,000 Units, Oral, DAILY, Einar Grad, MD, 5,000 Units at 12/27/17 1127   ?  dextrose (D50) infusion 5-25 g, 10-50 mL, IntraVENous, PRN, Einar Grad, MD   ?  glucagon (GLUCAGEN) injection 1 mg, 1 mg, IntraMUSCular, PRN, Einar Grad, MD   ?  insulin glargine (LANTUS) injection 1-100 Units, 1-100 Units, SubCUTAneous, QHS, Einar Grad, MD, 23 Units at 12/27/17 2317   ?  insulin lispro (HUMALOG) injection 1-100 Units, 1-100 Units, SubCUTAneous, AC&HS, Einar Grad, MD, 3 Units at 12/27/17 2317   ?  insulin lispro (HUMALOG) injection 1-100 Units, 1-100 Units, SubCUTAneous, PRN, Einar Grad,  MD, 2 Units at 12/26/17 2203   ?  oxyCODONE IR (ROXICODONE) tablet 10 mg, 10 mg, Oral, Q4H PRN, Einar Grad, MD, 10 mg at 12/26/17 1506   ?  potassium chloride (K-DUR, KLOR-CON) SR tablet 40 mEq, 40 mEq, Oral, BID, Einar Grad, MD, 40 mEq at 12/27/17 2207   ?  tacrolimus (PROGRAF) capsule 4 mg, 4 mg, Oral, BID, Einar Grad, MD, 4 mg at 12/27/17 2305   ?  prochlorperazine (COMPAZINE) tablet 10 mg, 10 mg, Oral, Q4H PRN, Einar Grad, MD, 10 mg at 12/25/17 1228   ?  zolpidem (AMBIEN) tablet 5 mg, 5 mg, Oral, QHS PRN, Einar Grad, MD, 5 mg at 12/27/17 2304   ?  trimethoprim-sulfamethoxazole (BACTRIM DS, SEPTRA DS) 160-800 mg per tablet 1 Tab, 1 Tab, Oral, Q MON, WED & Gara Kroner, MD, 1 Tab at 12/26/17 1512   ?  naloxone [facility]) injection 0.1 mg, 0.1 mg, IntraVENous, PRN, Einar Grad, MD   ?  acetaminophen (TYLENOL) tablet 650 mg, 650 mg, Oral, Q4H PRN **OR** acetaminophen (TYLENOL) solution 650 mg, 650 mg, Oral, Q4H PRN **OR** acetaminophen (TYLENOL) suppository 650 mg, 650 mg, Rectal, Q4H PRN, Einar Grad, MD   ?  nilotinib (TASIGNA) capsule cap 200 mg  (Patient Supplied), 200 mg, Oral, BID, Einar Grad, MD, 200 mg at 12/27/17 2210   ?  LORazepam (ATIVAN) tablet 1 mg, 1 mg, Oral, ON CALL, Einar Grad, MD   ?  budesonide (ENTOCORT EC) capsule 6 mg, 6 mg, Oral, TID, Jasarevic, Muhamed, MD, 6 mg at 12/27/17 2208   ?  vancomycin (VANCOCIN) 1000 mg in NS 250 ml infusion, 1,000 mg, IntraVENous, Q12H, Einar Grad, MD, Last Rate: 250 mL/hr at 12/28/17 0105, 1,000 mg at 12/28/17 0105   ?  *Pharmacy dosing vancomycin, 1 Each, Other, Rx Dosing/Monitoring, Einar Grad, MD  [name], MD    [facility]   Office [phone]

## 2017-12-28 NOTE — Progress Notes (Signed)
Progress Notes by Orma Flaming. [name] Purpura, MD at 12/28/17 1021                Author: Orma Flaming. [name] Purpura, MD  Service: Infectious Disease  Author Type: Physician       Filed: 12/28/17 1138  Date of Service: 12/28/17 1021  Status: Signed          Editor: Orma Flaming. 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 sent   ->monitor repeat bctx's from this am now line out to confirm clearance of bacteremia.  If neg after 2-3 days may replace picc -- pt says need iv access 2x/week; if persistent bsi would likely need TEE.     ->await repeat MRI and defer w/u for diplopia to Neurology, appears holding on LP for now, pt says better this am         MICROBIOLOGY:     8/14 bctx x 2 MRSE    uctx ngtd   8/16 bctx x 2 S epi see prior   8/18 bctx x 2 ip        LINES AND CATHETERS:     PICC        Active Hospital Problems           Diagnosis  Date Noted         ?  Bacteremia  12/26/2017         ?  TIA (transient ischemic attack)  12/25/2017                Subjective:        Interval notes reviewed. Pt says  feels improved, diplopia better this am. picc out tip culture not found but repeat bctx's this am pending.  She says will need picc line prior to discharge, d/w her await results repeat bctx's to proceed with this        Current Facility-Administered Medications  Medication  Dose  Route  Frequency           ?  VANCOMYCIN TROUGH DUE     Other  ONCE     ?  sodium chloride (NS) flush 10 mL   10 mL  InterCATHeter  DAILY     ?  sodium chloride (NS) flush 10-20 mL   10-20 mL  InterCATHeter  PRN     ?  heparin (porcine) pf 50 Units   50 Units  InterCATHeter  PRN     ?  heparin (porcine) pf 50 Units   50 Units  InterCATHeter  DAILY     ?  DULoxetine (CYMBALTA) capsule 20 mg   20 mg  Oral  DAILY     ?  magnesium oxide (MAG-OX) tablet 400 mg   400 mg  Oral  BID     ?  pantoprazole (PROTONIX) tablet 40 mg   40 mg  Oral  DAILY     ?  acyclovir (ZOVIRAX) tablet 400 mg   400 mg  Oral  BID     ?  cholecalciferol (VITAMIN D3) tablet 5,000 Units   5,000 Units  Oral  DAILY     ?  dextrose (D50) infusion 5-25 g   10-50 mL  IntraVENous  PRN     ?  glucagon (GLUCAGEN) injection 1 mg   1 mg  IntraMUSCular  PRN     ?  insulin glargine (LANTUS) injection 1-100 Units   1-100 Units  SubCUTAneous  QHS     ?  insulin lispro (HUMALOG) injection 1-100 Units   1-100 Units  SubCUTAneous  AC&HS           ?  insulin lispro (HUMALOG) injection 1-100 Units   1-100 Units  SubCUTAneous  PRN           ?  oxyCODONE IR (ROXICODONE) tablet 10 mg   10 mg  Oral  Q4H PRN     ?  potassium chloride (K-DUR, KLOR-CON) SR tablet 40 mEq   40 mEq  Oral  BID     ?  tacrolimus (PROGRAF) capsule 4 mg   4 mg  Oral  BID     ?  prochlorperazine (COMPAZINE) tablet 10 mg   10 mg  Oral  Q4H PRN     ?  zolpidem (AMBIEN) tablet 5 mg   5 mg  Oral  QHS PRN     ?  trimethoprim-sulfamethoxazole (BACTRIM DS, SEPTRA DS) 160-800 mg per tablet 1 Tab   1 Tab  Oral  Q MON, WED & FRI     ?  naloxone (NARCAN) injection 0.1 mg   0.1 mg  IntraVENous  PRN     ?  acetaminophen  (TYLENOL) tablet 650 mg   650 mg  Oral  Q4H PRN          Or           ?  acetaminophen (TYLENOL) solution 650 mg   650 mg  Oral  Q4H PRN          Or           ?  acetaminophen (TYLENOL) suppository 650 mg   650 mg  Rectal  Q4H PRN     ?  nilotinib (TASIGNA) capsule cap 200 mg  (Patient Supplied)   200 mg  Oral  BID     ?  LORazepam (ATIVAN) tablet 1 mg   1 mg  Oral  ON CALL     ?  budesonide (ENTOCORT EC) capsule 6 mg   6 mg  Oral  TID     ?  vancomycin (VANCOCIN) 1000 mg in NS 250 ml infusion   1,000 mg  IntraVENous  Q12H           ?  *Pharmacy dosing vancomycin   1 Each  Other  Rx Dosing/Monitoring                Objective:        Visit Vitals      BP  115/70 (BP 1 Location: Right arm, BP Patient Position: Supine)     Pulse  76     Temp  97.9 ??F (36.6 ??C)     Resp  18     Ht  5' (1.524 m)     Wt  67.5 kg (148 lb 13 oz)     SpO2  100%        BMI  22.63 kg/m??           Temp (24hrs), Avg:98.3 ??F (36.8 ??C), Min:97.9 ??F (36.6 ??C), Max:99 ??F (37.2 ??C)      GEN: WFWN BF appears stated age lying in bed NAD    HEENT: anicteric sclerae moist orophyarynx   NECK: Supple    CHEST: no rales rhonchi or wheeze   CVS: S1'S2' no rub murmur or gallop   ABD: soft non-tender (+) BS no masses non-distended   EXT: no edema LUE picc out          Labs:  Results:        Chemistry  Recent Labs         12/26/17   0440      GLU  207*      NA  140      K  4.7      CL  108*      CO2  27      BUN  6*      CREA  0.7      CA  8.9      AGAP  6                 CBC w/Diff  Recent Labs         12/26/17   0440      WBC  3.6*      RBC  2.83*      HGB  9.8*      HCT  29.0*      PLT  53*                 Mri brain 8/16 IMPRESSION:   1.   Nonspecific white matter hyperintensities likely due to chronic   microvascular ischemic changes. 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   2. Low-lying cerebellar tonsils.   3.  No orbital masses. No infarction      8/17 mri IMPRESSION:   ??   1.  No abnormal enhancement to suggest active infectious/inflammatory/neoplastic   process      Echo 8/16 Interpretation Summary  A complete two-dimensional transthoracic echocardiogram was performed (2D, M-  mode, Doppler and color flow Doppler).  The study was technically adequate.    Small left ventricle, norla wall thickness  with normal systolic function and   a  calculated ejectio fraction of 77%.  No regional wall motion abnormalites are noted.  Grade 1 diastolic  dysfunction.  Normal size right ventricle, normal systolic function.  No hemodynamic  valvular pathology or signficiant regurgitation.  Estimated right ventricular systolic pressure: 42HCWC.  Negative bubble study for interatrial shunting.      Microbiology Results   No results for input(s): SDES, CULT in the last 72 hours.      Estelle Grumbles, MD   December 28, 2017   [facility]   [phone]

## 2017-12-28 NOTE — Progress Notes (Signed)
Hospitalist Progress Note    Patient: [name] Wu MRN: [id number]  CSN: [id number]    Date of Birth: Sep 24, 1960  Age: 57 y.o.  Sex: female    DOA: 12/24/2017 LOS:  LOS: 2 days          Chief Complaint:   Chief Complaint   Patient presents with   ??? Nausea   ??? Double Vision   ??? Diarrhea       Assessment/Plan     1. Diplopia with transient slurred speech, left upper extremity weakness, gait disturbances. ??Concern for TIA.  This has abated completely so there is no blurred vision.  Concern about the use of ASA as a medication for pt. Bone marrow transplant. Pt. Desires [place] to call her specialist in [place], Monday.  2. Bacteremia, GPC clusters x 2 bottles, concern for blood stream infection. Pending results of cultures and PICC line tip.  3. 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  5. SIRS  6. 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  Appetite has improved.  7. Pancytopenia secondary to above  8. Insulin-dependent type 2 diabetes  9. Chronic pain    Patient Active Problem List   Diagnosis Code   ??? Chest pressure R07.89   ??? Elevated troponin R74.8   ??? Thrombocytopenia ([place]) D69.6   ??? Neutropenic fever ([facility]) D70.9, R50.81   ??? TIA (transient ischemic attack) G45.9   ??? Bacteremia R78.81       Subjective: Pt. Feels well today at the present time and whereas yesterday the diplopia improved there was a blurred vision as well that also improved. Appreciate specialists notes and comments.        Review of systems:   []  Unable to obtain  ROS due to  [] mental status change  [] sedated   [] intubated    Constitutional: No fever, chills, or weight loss  Eyes: No visual symptoms.  ENT: No sore throat, runny nose or ear pain.  Respiratory: No cough, dyspnea or wheezing.   Cardiovascular: No chest pain, pressure, palpitations, tightness or heaviness.  Gastrointestinal: No vomiting, diarrhea or abdominal pain.  Genitourinary: No dysuria, frequency, or urgency.  Musculoskeletal: No joint pain or swelling.  Integumentary: No rashes.  Neurological: No headaches, sensory or motor symptoms.    Vital signs/Intake and Output:  Visit Vitals  BP 116/70 (BP 1 Location: Right arm, BP Patient Position: Sitting)   Pulse 85   Temp 99.4 ??F (37.4 ??C)   Resp 16   Ht 5' (1.524 m)   Wt 67.5 kg (148 lb 13 oz)   SpO2 100%   BMI 22.63 kg/m??     Current Shift:  08/18 0701 - 08/18 1900  In: 360 [P.O.:360]  Out: -   Last three shifts:  08/16 1901 - 08/18 0700  In: 1330 [P.O.:480; I.V.:850]  Out: -     Exam:    General:  Alert, cooperative, no distress, appears stated age.   Head:  Normocephalic, without obvious abnormality, atraumatic.   Eyes:  Conjunctivae/corneas clear. PERRL, EOMs intact.                    Back:   Symmetric, no curvature. ROM normal. No CVA tenderness.   Lungs:   Clear to auscultation bilaterally.   Chest wall:  No tenderness or deformity.   Heart:  Regular rate and rhythm, S1, S2 normal, no murmur, click, rub or gallop.               Extremities: Extremities normal, atraumatic, no cyanosis or edema.   Pulses: 2+ and symmetric all extremities.   Skin: Skin color, texture, turgor normal. No rashes or lesions   Lymph nodes: Cervical, supraclavicular, and axillary nodes normal.   Neurologic: CNII-XII intact. Normal strength, sensation  throughout.             Labs:    Sodium   Date Value Ref Range Status   12/26/2017 140 136 - 145 mEq/L Final     Potassium   Date Value Ref Range Status   12/26/2017 4.7 3.5 - 5.1 mEq/L Final     BUN   Date Value Ref Range Status   12/26/2017 6 (L) 7 - 25 mg/dl Final     Calcium   Date Value Ref Range Status   12/26/2017 8.9 8.5 - 10.1 mg/dl Final     Chloride   Date Value Ref Range Status   12/26/2017 108 (H) 98 - 107 mEq/L Final     CO2    Date Value Ref Range Status   12/26/2017 27 21 - 32 mEq/L Final     Glucose   Date Value Ref Range Status   12/26/2017 207 (H) 74 - 106 mg/dl Final     HGB   Date Value Ref Range Status   12/26/2017 9.8 (L) 13.0 - 17.2 gm/dl Final     HCT   Date Value Ref Range Status   12/26/2017 29.0 (L) 37.0 - 50.0 % Final     WBC   Date Value Ref Range Status   12/26/2017 3.6 (L) 4.0 - 11.0 1000/mm3 Final     PLATELET   Date Value Ref Range Status   12/26/2017 53 (L) 140 - 450 1000/mm3 Final         Procedures/imaging: see electronic medical records for all procedures/Xrays and details which were not copied into this note but were reviewed prior to creation of Plan      Jr. Westley Chandler, MD  December 28, 2017

## 2017-12-28 NOTE — Progress Notes (Signed)
Neurology Note    History [name] Wu presents with the history of having left side paresthesias and speech difficulties which resolved, started on last Monday. She is also complaining of blurred and double vision.       Neurological Examination:    Mental status: Patient is alert and oriented times three, speech and language intact, repetition intact,Fund of knowledge and cognition intact.     Cranial Nerves:   I.   Not tested.  II.   Visual fields are full to confrontation, could not check fundus  III, IV, VI.     Both pupils equal and reactive, full conjugate movements, no nystagmus.  V.    Sensation on the face symmetrical and normal.  VII.                  No facial weakness.  VIII.   Hearing normal to finger rub bilaterally.  IX, X.   Voice and articulation are normal, palate movement symmetrical  XI.   Bilateral shoulder shrug strength normal.  XII.   Tongue normal in bulk and strength, no fasciculations    Motor:  Strength in bilateral upper extremities is 5/5. Strength in bilateral lower extremities is 5/5.  Tone and bulk normal. Gait normal.   Co-ordination:  Finger to nose normal. No dysmetria.  Sensory examination: Sensation to pin prick and temperature is bilaterally symmetrical and normal.   Deep tendon reflexes: DTRs are bilaterally 0. Bilateral toes downgoing.    IMPRESSION [name] Wu presents with the history of having left sided paresthesias and speech difficulties which resolved, and could be TIA. She does complain of double vision, which gets better closing one eye. It is possible that she might have small ischemia in 6th or 4th cranial nerve, seen commonly with diabetes.     There is full conjugate movement on examination at this time. There is no ptosis or any other focal weakness at this time. MRI brain showed small vessel changes but no acute changes. She just had LP done 3-4 days before her symptoms in [place] which did not show any malignant cells as per the patient.      Sometimes prograf can also cause neurotoxicity, which can present with diplopia. Hence MRI brain with contrast was repeated which was unremarkable. The patient today feels that her diplopia has improved and is feeling better.     PLAN  - continue mx of stroke risk factors  - could consider aspirin if not contraindicated  - will defer LP as she just had that done  - will eventually need eval by ophthalmology as well  - cont mx of stroke risk factors  - can f.up with her oncologist as outpt        Medications:    Current Facility-Administered Medications:   ???  VANCOMYCIN TROUGH DUE, , Other, ONCE, Riles, Jr. Francene Boyers, MD  ???  sodium chloride (NS) flush 10 mL, 10 mL, InterCATHeter, DAILY, Josie Saunders, Anuj, MD, 10 mL at 12/28/17 1023  ???  sodium chloride (NS) flush 10-20 mL, 10-20 mL, InterCATHeter, PRN, Josie Saunders, Anuj, MD  ???  heparin (porcine) pf 50 Units, 50 Units, InterCATHeter, PRN, Josie Saunders, Anuj, MD  ???  heparin (porcine) pf 50 Units, 50 Units, InterCATHeter, DAILY, Sundra Aland, MD, Stopped at 12/28/17 0900  ???  DULoxetine (CYMBALTA) capsule 20 mg, 20 mg, Oral, DAILY, Sundra Aland, MD, 20 mg at 12/27/17 2305  ???  magnesium oxide (MAG-OX) tablet 400 mg, 400 mg, Oral, BID, Sundra Aland, MD, 400  mg at 12/28/17 1019  ???  pantoprazole (PROTONIX) tablet 40 mg, 40 mg, Oral, DAILY, Josie Saunders, Anuj, MD, 40 mg at 12/28/17 1020  ???  acyclovir (ZOVIRAX) tablet 400 mg, 400 mg, Oral, BID, Einar Grad, MD, 400 mg at 12/28/17 1020  ???  cholecalciferol (VITAMIN D3) tablet 5,000 Units, 5,000 Units, Oral, DAILY, Einar Grad, MD, 5,000 Units at 12/28/17 1020  ???  dextrose (D50) infusion 5-25 g, 10-50 mL, IntraVENous, PRN, Einar Grad, MD  ???  glucagon (GLUCAGEN) injection 1 mg, 1 mg, IntraMUSCular, PRN, Einar Grad, MD  ???  insulin glargine (LANTUS) injection 1-100 Units, 1-100 Units, SubCUTAneous, QHS, Einar Grad, MD, 23 Units at 12/27/17 2317  ???  insulin lispro (HUMALOG) injection 1-100 Units, 1-100 Units,  SubCUTAneous, AC&HS, Einar Grad, MD, 3 Units at 12/27/17 2317  ???  insulin lispro (HUMALOG) injection 1-100 Units, 1-100 Units, SubCUTAneous, PRN, Einar Grad, MD, 2 Units at 12/26/17 2203  ???  oxyCODONE IR (ROXICODONE) tablet 10 mg, 10 mg, Oral, Q4H PRN, Einar Grad, MD, 10 mg at 12/26/17 1506  ???  potassium chloride (K-DUR, KLOR-CON) SR tablet 40 mEq, 40 mEq, Oral, BID, Einar Grad, MD, 40 mEq at 12/28/17 1020  ???  tacrolimus (PROGRAF) capsule 4 mg, 4 mg, Oral, BID, Einar Grad, MD, 4 mg at 12/28/17 1020  ???  prochlorperazine (COMPAZINE) tablet 10 mg, 10 mg, Oral, Q4H PRN, Einar Grad, MD, 10 mg at 12/25/17 1228  ???  zolpidem (AMBIEN) tablet 5 mg, 5 mg, Oral, QHS PRN, Einar Grad, MD, 5 mg at 12/27/17 2304  ???  trimethoprim-sulfamethoxazole (BACTRIM DS, SEPTRA DS) 160-800 mg per tablet 1 Tab, 1 Tab, Oral, Q MON, WED & Gara Kroner, MD, 1 Tab at 12/26/17 1512  ???  naloxone (NARCAN) injection 0.1 mg, 0.1 mg, IntraVENous, PRN, Einar Grad, MD  ???  acetaminophen (TYLENOL) tablet 650 mg, 650 mg, Oral, Q4H PRN **OR** acetaminophen (TYLENOL) solution 650 mg, 650 mg, Oral, Q4H PRN **OR** acetaminophen (TYLENOL) suppository 650 mg, 650 mg, Rectal, Q4H PRN, Einar Grad, MD  ???  nilotinib (TASIGNA) capsule cap 200 mg  (Patient Supplied), 200 mg, Oral, BID, Einar Grad, MD, 200 mg at 12/28/17 1018  ???  LORazepam (ATIVAN) tablet 1 mg, 1 mg, Oral, ON CALL, Einar Grad, MD  ???  budesonide (ENTOCORT EC) capsule 6 mg, 6 mg, Oral, TID, Jasarevic, Muhamed, MD, 6 mg at 12/28/17 1019  ???  vancomycin (VANCOCIN) 1000 mg in NS 250 ml infusion, 1,000 mg, IntraVENous, Q12H, Einar Grad, MD, Last Rate: 250 mL/hr at 12/28/17 0105, 1,000 mg at 12/28/17 0105  ???  *Pharmacy dosing vancomycin, 1 Each, Other, Rx Dosing/Monitoring, Einar Grad, MD    Vitals  Patient Vitals for the past 24 hrs:   Temp Pulse Resp BP SpO2   12/28/17 0828 97.9 ??F (36.6 ??C) 76 18 115/70 100 %   12/28/17 0355 98.4 ??F (36.9 ??C) 82 18 114/70 100 %    12/28/17 0003 99 ??F (37.2 ??C) 81 18 129/66 100 %   12/27/17 2057 98.5 ??F (36.9 ??C) 89 18 131/77 100 %   12/27/17 1522 98.1 ??F (36.7 ??C) 90 20 132/82 100 %   12/27/17 1113 97.9 ??F (36.6 ??C) 81 18 109/69 98 %         Imaging  Mri Brain W Cont    Result Date: 12/27/2017  MRI OF THE BRAIN INDICATION: diplopia.    COMPARISON: Noncontrast MRI 12/26/2017 TECHNIQUE: Multiplanar, multisequence MRI of the brain was performed without intravenous contrast.  FINDINGS: The patient returned for contrast-enhanced imaging of the brain. Images are degraded by patient motion artifacts. However, there is no abnormal parenchymal, dural, or meningeal enhancement. No gross sellar mass.     IMPRESSION: 1.  No abnormal enhancement to suggest active infectious/inflammatory/neoplastic process      Echo Results  (Last 48 hours)               12/26/17 1341  2D ECHO COMPLETE ADULT WO CONTRAST Final result    Narrative:                                                                Study ID:    [facility]                                         [address]                                                 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   The study was technically adequate.       Small left ventricle, norla wall thickness with normal systolic function and    a   calculated ejectio fraction of 77%.   No regional wall motion abnormalites are noted.   Grade 1 diastolic dysfunction.   Normal size right ventricle, normal systolic function.   No hemodynamic valvular pathology or signficiant regurgitation.   Estimated right ventricular systolic pressure: [id number].   Negative bubble study for interatrial shunting.   Other findings are noted below.       Prior echo was done 04/25/2017 the EF was reported at 63%. Grade 1 diastolic   dysfunction. RVSP 41mmHg.           _____________________________________________________________________________   __           Left Ventricle   The left ventricular cavity is small. There is normal left ventricular wall   thickness. Left ventricular systolic function is normal. The calculated left   ventricular ejection fraction is 77%. Based on the transmitral spectral   Doppler flow pattern the diastolic function is Grade 1. No regional wall   motion abnormalities noted.       Right Ventricle   The right ventricle is normal size. Right ventricular systolic function is   normal: TAPSE: 2.1cm. Tissue Doppler Interrogation of RV S' wave correlates   with TAPSE.       Atria   Left atrial volume index: 39ml/m2. Right atrial volume index 88ml/m2.    Injection   of Saline Contrast was negative for interatrial shunting.           Mitral Valve   The mitral valve leaflets appear normal. There is no evidence of stenosis,   fluttering, or prolapse. There is no evidence of mitral valve prolapse. There   is no mitral regurgitation noted.       Tricuspid Valve   The tricuspid valve is normal. There is no tricuspid valve prolapse.    Tricuspid    regurgitation is not well seen with Colorflow Doppler. Tricuspid    regurgitation   peak flow velocity is documented at 2.2 meters per second. Based on the peak   tricuspid regurgitation velocity the estimated right ventricular systolic   pressure is 23 mmHg.       Aortic Valve   The aortic valve is normal in structure and function. No aortic regurgitation   is present.       Pulmonic Valve   The pulmonic valve leaflets are thin and pliable; valve motion is normal.   Trivial pulmonic valvular regurgitation.       Great Vessels   The aortic root is normal size.           Pericardium/Pleural   There is no pericardial effusion.  MEASUREMENTS/CALCULATIONS:       MMode/2D Measurements & Calculations   RVDd: 2.6 cm                           LVIDd: 3.6 cm   IVSd: 1.0 cm                           LVIDs: 2.3 cm                                          LVPWd: 0.99 cm           _______________________________________________________________       FS: 35.6 %                             Ao root diam: 3.4 cm                                          Ao root area: 8.8 cm2               _______________________________________________________________   TAPSE: 2.1 cm                                          RA A4Cs: 9.3 cm2           _______________________________________________________________   LA ESV (BP): 19.4 ml                                          LA ESV Index (BP): 11.5 ml/m2           _______________________________________________________________       RA ESV Index (A4C): 9.3 ml/m2       Doppler Measurements & Calculations   MV A dur: 0.09 sec                      MV dec time: 0.27 sec   MV E max vel: 74.1 cm/sec   MV A max vel: 83.3 cm/sec   MV E/A: 0.89   LV IVRT: 0.09 sec               _______________________________________________________________   Lat Peak E' Vel: 8.0 cm/sec             Med Peak E' Vel: 4.9 cm/sec               _______________________________________________________________    Ao V2 max: 158.6 cm/sec                 LV V1 max PG: 6.6 mmHg   Ao max PG: 10.1 mmHg                    LV V1 max: 128.6 cm/sec           _______________________________________________________________       TR max vel: 222.4 cm/sec                RAP  systole: 3.0 mmHg   TR max PG: 19.8 mmHg   RVSP(TR): 22.8 mmHg           _______________________________________________________________       Fatima Sanger A Revs Vel: 32.8 cm/sec            AVA Dim Index: 0.81   Pulm A Revs Dur: 0.10 sec           _______________________________________________________________       E/E' lat: 9.3                           E/E' med: 15.2           Electronically signed byDr. Loyal Buba. Herma Mering., MD   12/26/2017 01:41                           PM               Labs  No results found for this visit on 12/24/17 (from the past 12 hour(s)).  Lab Results   Component Value Date/Time    Cholesterol, total 174 12/26/2017 04:40 AM    HDL Cholesterol 57 12/26/2017 04:40 AM    LDL, calculated 88 12/26/2017 04:40 AM    Triglyceride 145 12/26/2017 04:40 AM    CHOL/HDL Ratio 3.1 12/26/2017 04:40 AM           Delora Fuel, MD

## 2017-12-28 NOTE — Other (Signed)
Bedside and Verbal shift change report given to [place]   (oncoming nurse) by Venancio Poisson, LPN (offgoing nurse). Report included the following information SBAR, Kardex, Recent Results and Cardiac Rhythm NSR.

## 2017-12-28 NOTE — Other (Deleted)
PHYSICIAN???S DOCUMENTATION REQUEST   This Form is Not a Permanent Document in the Medical Record         By submitting this query, we are merely seeking further clarification of documentation to accurately reflect all conditions that you are monitoring, evaluating, treating or that extend the hospitalization or utilize additional resources of care. Please utilize your independent clinical judgment when addressing the question(s) below.    Dear Dr. Ceasar Lund,    Documentation by Dr. Ceasar Lund states Pancytopenia.  1. 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 Antineoplastic chemotherapy induced  ?? Other drug/medication induced (specify if known):  ?? Due to aplastic anemia  ?? Due to bone marrow infiltration  ?? Radiation therapy induced  ?? Due to HIV  ?? Due to AIDs  ?? Due to myeloproliferative disease  ?? Other (Please Specify):  ?? Clinically Undetermined        PLEASE DOCUMENT ANY ADDITIONAL DIAGNOSES AND/OR SPECIFICITY IN THE PROGRESS NOTES AND/OR DISCHARGE SUMMARY.  Thank you,  Lexine Baton MD, CCS, [place], [place] Documentation Specialist  [phone]

## 2017-12-28 NOTE — Progress Notes (Signed)
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 sent  ->monitor repeat bctx's from this am now line out to confirm clearance of bacteremia.  If neg after 2-3 days may replace picc -- pt says need iv access 2x/week; if persistent bsi would likely need TEE.    ->await repeat MRI and defer w/u for diplopia to Neurology, appears holding on LP for now, pt says better this am     MICROBIOLOGY:   8/14 bctx x 2 MRSE   uctx ngtd  8/16 bctx x 2 S epi see prior  8/18 bctx x 2 ip    LINES AND CATHETERS:   PICC    Active Hospital Problems    Diagnosis Date Noted   ??? Bacteremia 12/26/2017   ??? TIA (transient ischemic attack) 12/25/2017         Subjective:     Interval notes reviewed. Pt says feels improved, diplopia better this am. picc out tip culture not found but repeat bctx's this am pending.  She says will need picc line prior to discharge, d/w her await results repeat bctx's to proceed with this    Current Facility-Administered Medications   Medication Dose Route Frequency   ??? VANCOMYCIN TROUGH DUE   Other ONCE   ??? sodium chloride (NS) flush 10 mL  10 mL InterCATHeter DAILY    ??? sodium chloride (NS) flush 10-20 mL  10-20 mL InterCATHeter PRN   ??? heparin (porcine) pf 50 Units  50 Units InterCATHeter PRN   ??? heparin (porcine) pf 50 Units  50 Units InterCATHeter DAILY   ??? DULoxetine (CYMBALTA) capsule 20 mg  20 mg Oral DAILY   ??? magnesium oxide (MAG-OX) tablet 400 mg  400 mg Oral BID   ??? pantoprazole (PROTONIX) tablet 40 mg  40 mg Oral DAILY   ??? acyclovir (ZOVIRAX) tablet 400 mg  400 mg Oral BID   ??? cholecalciferol (VITAMIN D3) tablet 5,000 Units  5,000 Units Oral DAILY   ??? dextrose (D50) infusion 5-25 g  10-50 mL IntraVENous PRN   ??? glucagon (GLUCAGEN) injection 1 mg  1 mg IntraMUSCular PRN   ??? insulin glargine (LANTUS) injection 1-100 Units  1-100 Units SubCUTAneous QHS   ??? insulin lispro (HUMALOG) injection 1-100 Units  1-100 Units SubCUTAneous AC&HS   ??? insulin lispro (HUMALOG) injection 1-100 Units  1-100 Units SubCUTAneous PRN   ??? oxyCODONE IR (ROXICODONE) tablet 10 mg  10 mg Oral Q4H PRN   ???  potassium chloride (K-DUR, KLOR-CON) SR tablet 40 mEq  40 mEq Oral BID   ??? tacrolimus (PROGRAF) capsule 4 mg  4 mg Oral BID   ??? prochlorperazine (COMPAZINE) tablet 10 mg  10 mg Oral Q4H PRN   ??? zolpidem (AMBIEN) tablet 5 mg  5 mg Oral QHS PRN   ??? trimethoprim-sulfamethoxazole (BACTRIM DS, SEPTRA DS) 160-800 mg per tablet 1 Tab  1 Tab Oral Q MON, WED & FRI   ??? naloxone (NARCAN) injection 0.1 mg  0.1 mg IntraVENous PRN   ??? acetaminophen (TYLENOL) tablet 650 mg  650 mg Oral Q4H PRN    Or   ??? acetaminophen (TYLENOL) solution 650 mg  650 mg Oral Q4H PRN    Or   ??? acetaminophen (TYLENOL) suppository 650 mg  650 mg Rectal Q4H PRN   ??? nilotinib (TASIGNA) capsule cap 200 mg  (Patient Supplied)  200 mg Oral BID   ??? LORazepam (ATIVAN) tablet 1 mg  1 mg Oral ON CALL   ??? budesonide (ENTOCORT EC) capsule 6 mg  6 mg Oral TID   ??? vancomycin (VANCOCIN) 1000 mg in NS 250 ml infusion  1,000 mg IntraVENous Q12H   ??? *Pharmacy dosing vancomycin  1 Each Other Rx Dosing/Monitoring         Objective:     Visit Vitals   BP 115/70 (BP 1 Location: Right arm, BP Patient Position: Supine)   Pulse 76   Temp 97.9 ??F (36.6 ??C)   Resp 18   Ht 5' (1.524 m)   Wt 67.5 kg (148 lb 13 oz)   SpO2 100%   BMI 22.63 kg/m??       Temp (24hrs), Avg:98.3 ??F (36.8 ??C), Min:97.9 ??F (36.6 ??C), Max:99 ??F (37.2 ??C)    GEN: WFWN BF appears stated age lying in bed NAD   HEENT: anicteric sclerae moist orophyarynx  NECK: Supple   CHEST: no rales rhonchi or wheeze  CVS: S1'S2' no rub murmur or gallop  ABD: soft non-tender (+) BS no masses non-distended  EXT: no edema LUE picc out     Labs: Results:   Chemistry Recent Labs     12/26/17  0440   GLU 207*   NA 140   K 4.7   CL 108*   CO2 27   BUN 6*   CREA 0.7   CA 8.9   AGAP 6      CBC w/Diff Recent Labs     12/26/17  0440   WBC 3.6*   RBC 2.83*   HGB 9.8*   HCT 29.0*   PLT 53*        Mri brain 8/16 IMPRESSION:  1.   Nonspecific white matter hyperintensities likely due to chronic  microvascular ischemic changes. Iatrogenic drug toxicity, graft-versus-host,  inflammatory/infectious, hypertension, demyelinating process, trauma, chronic  headaches, sequelae of vasculitis are other considerations.  2. Low-lying cerebellar tonsils.  3. No orbital masses. No infarction    8/17 mri IMPRESSION:  ??  1.  No abnormal enhancement to suggest active infectious/inflammatory/neoplastic  process    Echo 8/16 Interpretation Summary  A complete two-dimensional transthoracic echocardiogram was performed (2D, M-  mode, Doppler and color flow Doppler).  The study was technically adequate.    Small left ventricle, norla wall thickness with normal systolic function and   a  calculated ejectio fraction of 77%.  No regional wall motion abnormalites are noted.  Grade 1 diastolic dysfunction.  Normal size right ventricle, normal systolic function.  No  hemodynamic valvular pathology or signficiant regurgitation.  Estimated right ventricular systolic pressure: [id number].  Negative bubble study for interatrial shunting.    Microbiology Results   No results for input(s): SDES, CULT in the last 72 hours.    Estelle Grumbles, MD  December 28, 2017  [facility]  [phone]

## 2017-12-28 NOTE — Other (Deleted)
PHYSICIAN???S DOCUMENTATION REQUEST   This Form is Not a Permanent Document in the Medical Record         By submitting this query, we are merely seeking further clarification of documentation to accurately reflect all conditions that you are monitoring, evaluating, treating or that extend the hospitalization or utilize additional resources of care. Please utilize your independent clinical judgment when addressing the question(s) below.    Dear Dr. Ceasar Lund    Documentation by Dr. Ceasar Lund states Bacteremia.    Documentation by Dr. Ceasar Lund  1. Bacteremia, GPC clusters x 2 bottles, concern for blood stream infection. Pending results of cultures and PICC line tip.  1. SIRS    Documentation by Dr. Kellie Moor  Pos bctx's GPC cl 2 of 2 8/14-MRSE, suspect early picc infection   -repeat bctx's 8/16 2 of 2 GPC cl incl from picc   -TTE no reported vegetation, bubble study negative     Lab reports   WBC 2.9      Vital signs   Temp 98.6, HR 112/min, RR 24/min  .................................................................................................................................    Please clarify the diagnosis of Bacteremia:  ?? Sepsis  ?? Severe Sepsis  ?? Septic Shock  ?? Bacteremia, meaning presence of bacteria in the blood without systemic infection or involvement  ?? Contaminant  ?? Other (Please Specify)  ?? Clinically Undetermined    Is the above diagnosis related to a Device?  ?? Yes  ?? No  ?? Clinically Undetermined    If Yes, state device type  ?? Urinary Catheter  ?? PICC Line  ?? Central Line  ?? Other (Please Specify)    Was the diagnosis Present on Admission?  ?? Yes  ?? No  ?? Clinically Undetermined        PLEASE DOCUMENT ANY ADDITIONAL DIAGNOSES AND/OR SPECIFICITY IN THE PROGRESS NOTES AND/OR DISCHARGE SUMMARY.    Thank you,  Lexine Baton MD, CCS, [place], [place] Documentation Specialist  [phone]

## 2017-12-28 NOTE — Progress Notes (Signed)
Hematology / Oncology Progress Note      Patient: [name] Wu  Gender: female   MRN: [id number]    Date of Birth: [birth year] Age: 57 y.o.   CSN: [id number]    LOS:  LOS: 2 days   Admit Date: 12/24/2017     PCP: Other, Phys, MD    Assessment:   Active Problems:    TIA (transient ischemic attack) (12/25/2017)      Bacteremia (12/26/2017)        Plan:   Diplopia and paresthesias.  Resolved.  More likely related to TIA/ischemia.  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  Spinal tap not indicated at this time.  Patient may go on aspirin.  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  Continue with tasigna.  Subjective:   Patient feels better.  Diplopia and paresthesias have resolved.    Objective:     Visit Vitals  BP 114/70 (BP 1 Location: Left arm, BP Patient Position: Supine)   Pulse 82   Temp 98.4 ??F (36.9 ??C)   Resp 18   Ht 5' (1.524 m)   Wt 67.5 kg (148 lb 13 oz)   SpO2 100%   BMI 22.63 kg/m??             Intake/Output Summary (Last 24 hours) at 12/28/2017 8295  Last data filed at 12/27/2017 2145  Gross per 24 hour   Intake 1330 ml   Output ???   Net 1330 ml       Review of Systems:   Constitutional: negative for fevers, chills, sweats. sl fatigue  Eyes: negative for visual disturbance, redness and icterus  Ears, Nose, Mouth, Throat, and Face: negative for tinnitus, epistaxis, sore mouth and hoarseness  Respiratory: negative for cough, sputum, hemoptysis, pleurisy/chest pain or wheezing  Cardiovascular: negative for chest pain, chest pressure/discomfort, palpitations, irregular heart beats, syncope, paroxysmal nocturnal dyspnea  Gastrointestinal: negative for reflux symptoms, nausea, vomiting, change in bowel habits, melena, diarrhea, constipation and abdominal pain  Genitourinary:negative for dysuria, nocturia, urinary incontinence, hesitancy and hematuria  Integument: negative for rash, skin lesion(s) and pruritus  Hematologic/Lymphatic: negative for easy bruising, bleeding and  lymphadenopathy  Musculoskeletal:negative for myalgias, arthralgias and bone pain  Neurological: negative for headaches, dizziness, seizures, paresthesia and weakness    Physical Assessment:   Constitutional: Alert, oriented, not in distress  Eyes: PERRLA, anicteric, no redness  Ears, nose, mouth, throat, and face: no palpable Lymph nodes, no mucositis, no thrush.  Respiratory: symmetrical expansion, no rales, no rhonchi, no wheezing.  Cardiovascular: S1S2, no pathologic murmur, no rub.  Gastrointestinal: soft, benign, non tender, no HSM, normal bowel sounds, no mass.  Integument: no rash, no petechiae, no ecchymosis.  Musculoskeletal: no edema, no cyanosis, no clubbing.  Neurological: intact, cranial nerves, no focal motor or sensory deficits.    Diagnostic test:     Basic Metabolic Profile   Recent Labs     12/26/17  0440   NA 140   K 4.7   CL 108*   CO2 27   BUN 6*   CREA 0.7   GLU 207*   CA 8.9   MG 1.4*        CBC w/Diff    Recent Labs     12/26/17  0440   WBC 3.6*   HGB 9.8*   HCT 29.0*   PLT 53*    No results for input(s): BANDS, GRANS in the last 72 hours.    No lab exists for component:  LYMPHS     CHEMISTRY   Lab Results   Component Value Date    ALB 3.3 (L) 12/24/2017    TP 5.5 (L) 12/24/2017    CBIL 0.1 12/24/2017    TBILI 0.4 12/24/2017    ALT 29 12/24/2017    SGOT 16 12/24/2017    AP 96 12/24/2017    LPSE 41 (L) 12/24/2017         Coagulation   Lab Results   Component Value Date/Time    Prothrombin time 12.1 12/25/2017 03:18 AM    Prothrombin time 14.1 (H) 05/13/2017 08:33 AM    Prothrombin time 12.5 04/25/2017 07:04 AM    INR 1.0 12/25/2017 03:18 AM    INR 1.2 (H) 05/13/2017 08:33 AM    INR 1.1 04/25/2017 07:04 AM        Current Medications:     Current Facility-Administered Medications:   ???  VANCOMYCIN TROUGH DUE, , Other, ONCE, Riles, Jr. Francene Boyers, MD  ???  sodium chloride (NS) flush 10 mL, 10 mL, InterCATHeter, DAILY, Josie Saunders, Anuj, MD, 10 mL at 12/27/17 1129   ???  sodium chloride (NS) flush 10-20 mL, 10-20 mL, InterCATHeter, PRN, Josie Saunders, Anuj, MD  ???  heparin (porcine) pf 50 Units, 50 Units, InterCATHeter, PRN, Josie Saunders, Anuj, MD  ???  heparin (porcine) pf 50 Units, 50 Units, InterCATHeter, DAILY, Sundra Aland, MD, 50 Units at 12/27/17 1128  ???  DULoxetine (CYMBALTA) capsule 20 mg, 20 mg, Oral, DAILY, Sundra Aland, MD, 20 mg at 12/27/17 2305  ???  magnesium oxide (MAG-OX) tablet 400 mg, 400 mg, Oral, BID, Sundra Aland, MD, 400 mg at 12/27/17 2209  ???  pantoprazole (PROTONIX) tablet 40 mg, 40 mg, Oral, DAILY, Sundra Aland, MD, 40 mg at 12/27/17 1128  ???  acyclovir (ZOVIRAX) tablet 400 mg, 400 mg, Oral, BID, Einar Grad, MD, 400 mg at 12/27/17 2209  ???  cholecalciferol (VITAMIN D3) tablet 5,000 Units, 5,000 Units, Oral, DAILY, Einar Grad, MD, 5,000 Units at 12/27/17 1127  ???  dextrose (D50) infusion 5-25 g, 10-50 mL, IntraVENous, PRN, Einar Grad, MD  ???  glucagon (GLUCAGEN) injection 1 mg, 1 mg, IntraMUSCular, PRN, Einar Grad, MD  ???  insulin glargine (LANTUS) injection 1-100 Units, 1-100 Units, SubCUTAneous, QHS, Einar Grad, MD, 23 Units at 12/27/17 2317  ???  insulin lispro (HUMALOG) injection 1-100 Units, 1-100 Units, SubCUTAneous, AC&HS, Einar Grad, MD, 3 Units at 12/27/17 2317  ???  insulin lispro (HUMALOG) injection 1-100 Units, 1-100 Units, SubCUTAneous, PRN, Einar Grad, MD, 2 Units at 12/26/17 2203  ???  oxyCODONE IR (ROXICODONE) tablet 10 mg, 10 mg, Oral, Q4H PRN, Einar Grad, MD, 10 mg at 12/26/17 1506  ???  potassium chloride (K-DUR, KLOR-CON) SR tablet 40 mEq, 40 mEq, Oral, BID, Einar Grad, MD, 40 mEq at 12/27/17 2207  ???  tacrolimus (PROGRAF) capsule 4 mg, 4 mg, Oral, BID, Einar Grad, MD, 4 mg at 12/27/17 2305  ???  prochlorperazine (COMPAZINE) tablet 10 mg, 10 mg, Oral, Q4H PRN, Einar Grad, MD, 10 mg at 12/25/17 1228  ???  zolpidem (AMBIEN) tablet 5 mg, 5 mg, Oral, QHS PRN, Einar Grad, MD, 5 mg at 12/27/17 2304   ???  trimethoprim-sulfamethoxazole (BACTRIM DS, SEPTRA DS) 160-800 mg per tablet 1 Tab, 1 Tab, Oral, Q MON, WED & Gara Kroner, MD, 1 Tab at 12/26/17 1512  ???  naloxone (NARCAN) injection 0.1 mg, 0.1 mg, IntraVENous, PRN, Einar Grad, MD  ???  acetaminophen (TYLENOL) tablet 650 mg, 650 mg, Oral,  Q4H PRN **OR** acetaminophen (TYLENOL) solution 650 mg, 650 mg, Oral, Q4H PRN **OR** acetaminophen (TYLENOL) suppository 650 mg, 650 mg, Rectal, Q4H PRN, Einar Grad, MD  ???  nilotinib (TASIGNA) capsule cap 200 mg  (Patient Supplied), 200 mg, Oral, BID, Einar Grad, MD, 200 mg at 12/27/17 2210  ???  LORazepam (ATIVAN) tablet 1 mg, 1 mg, Oral, ON CALL, Einar Grad, MD  ???  budesonide (ENTOCORT EC) capsule 6 mg, 6 mg, Oral, TID, Dawson Bills, Muhamed, MD, 6 mg at 12/27/17 2208  ???  vancomycin (VANCOCIN) 1000 mg in NS 250 ml infusion, 1,000 mg, IntraVENous, Q12H, Einar Grad, MD, Last Rate: 250 mL/hr at 12/28/17 0105, 1,000 mg at 12/28/17 0105  ???  *Pharmacy dosing vancomycin, 1 Each, Other, Rx Dosing/Monitoring, Einar Grad, MD        [name], MD   [facility] [phone]

## 2017-12-29 LAB — CBC WITH AUTOMATED DIFF
Anisocytosis: 1
BAND NEUTROPHILS: 2.9 % (ref 0–11)
HCT: 28.2 % — ABNORMAL LOW (ref 37.0–50.0)
HGB: 9.4 gm/dl — ABNORMAL LOW (ref 13.0–17.2)
LYMPHOCYTES: 12.6 % — ABNORMAL LOW (ref 28–48)
MCH: 33.9 pg (ref 25.4–34.6)
MCHC: 33.3 gm/dl (ref 30.0–36.0)
MCV: 101.8 fL — ABNORMAL HIGH (ref 80.0–98.0)
MONOCYTES: 6.8 % (ref 1–13)
MPV: 10 fL (ref 6.0–10.0)
NEUTROPHILS: 77.7 % — ABNORMAL HIGH (ref 34–64)
PLATELET COMMENTS: DECREASED
PLATELET: 64 10*3/uL — ABNORMAL LOW (ref 140–450)
Polychromasia: 1
RBC: 2.77 M/uL — ABNORMAL LOW (ref 3.60–5.20)
RDW-SD: 57.2 — ABNORMAL HIGH (ref 36.4–46.3)
WBC: 4.1 10*3/uL (ref 4.0–11.0)

## 2017-12-29 LAB — METABOLIC PANEL, BASIC
Anion gap: 5 mmol/L (ref 5–15)
BUN: 10 mg/dl (ref 7–25)
CO2: 28 mEq/L (ref 21–32)
Calcium: 8.9 mg/dl (ref 8.5–10.1)
Chloride: 107 mEq/L (ref 98–107)
Creatinine: 0.8 mg/dl (ref 0.6–1.3)
GFR est AA: >60
GFR est non-AA: >60
Glucose: 236 mg/dl — ABNORMAL HIGH (ref 74–106)
Potassium: 5.1 mEq/L (ref 3.5–5.1)
Sodium: 140 mEq/L (ref 136–145)

## 2017-12-29 LAB — GLUCOSE, POC
Glucose (POC): 389 mg/dL — ABNORMAL HIGH (ref 65–105)
Glucose (POC): 218 mg/dL — ABNORMAL HIGH (ref 65–105)
Glucose (POC): 217 mg/dL — ABNORMAL HIGH (ref 65–105)
Glucose (POC): 138 mg/dL — ABNORMAL HIGH (ref 65–105)

## 2017-12-29 LAB — MAGNESIUM
Magnesium: 1.4 mg/dl — ABNORMAL LOW (ref 1.6–2.6)
Magnesium: 1.4 mg/dl — ABNORMAL LOW (ref 1.6–2.6)

## 2017-12-29 LAB — BASIC METABOLIC PANEL
Anion Gap: 5 mmol/L (ref 5–15)
BUN: 10 mg/dl (ref 7–25)
CO2: 28 mEq/L (ref 21–32)
Calcium: 8.9 mg/dl (ref 8.5–10.1)
Chloride: 107 mEq/L (ref 98–107)
Creatinine: 0.8 mg/dl (ref 0.6–1.3)
GFR African American: >60
Glucose: 236 mg/dl — ABNORMAL HIGH (ref 74–106)
Potassium: 5.1 mEq/L (ref 3.5–5.1)
Sodium: 140 mEq/L (ref 136–145)
eGFR NON-AA: >60

## 2017-12-29 LAB — CBC WITH AUTO DIFFERENTIAL
Anisocytosis: 1
Band Neutrophils: 2.9 % (ref 0–11)
Hematocrit: 28.2 % — ABNORMAL LOW (ref 37.0–50.0)
Hemoglobin: 9.4 gm/dl — ABNORMAL LOW (ref 13.0–17.2)
Lymphocytes %: 12.6 % — ABNORMAL LOW (ref 28–48)
MCH: 33.9 pg (ref 25.4–34.6)
MCHC: 33.3 gm/dl (ref 30.0–36.0)
MCV: 101.8 fL — ABNORMAL HIGH (ref 80.0–98.0)
MPV: 10 fL (ref 6.0–10.0)
Monocytes %: 6.8 % (ref 1–13)
Neutrophils %: 77.7 % — ABNORMAL HIGH (ref 34–64)
POLYCHROMASIA: 1
Platelet Comment: DECREASED
Platelets: 64 10*3/uL — ABNORMAL LOW (ref 140–450)
RBC: 2.77 M/uL — ABNORMAL LOW (ref 3.60–5.20)
RDW-SD: 57.2 — ABNORMAL HIGH (ref 36.4–46.3)
WBC: 4.1 10*3/uL (ref 4.0–11.0)

## 2017-12-29 LAB — POCT GLUCOSE
POC Glucose: 389 mg/dL — ABNORMAL HIGH (ref 65–105)
POC Glucose: 218 mg/dL — ABNORMAL HIGH (ref 65–105)
POC Glucose: 217 mg/dL — ABNORMAL HIGH (ref 65–105)
POC Glucose: 138 mg/dL — ABNORMAL HIGH (ref 65–105)

## 2017-12-29 MED ORDER — MAGNESIUM SULFATE 4 GRAM/100 ML IV PIGGY BACK
4 gram/100 mL ( %) | Freq: Once | INTRAVENOUS | Status: AC
Start: 2017-12-29 — End: 2017-12-30
  Administered 2017-12-30: 03:00:00 via INTRAVENOUS

## 2017-12-29 MED ORDER — NYSTATIN 100,000 UNIT/ML ORAL SUSP
100000 unit/mL | Freq: Four times a day (QID) | ORAL | Status: DC
Start: 2017-12-29 — End: 2018-01-02
  Administered 2017-12-29 – 2018-01-02 (×18): via ORAL

## 2017-12-29 MED FILL — ZOLPIDEM 5 MG TAB: 5 mg | ORAL | Qty: 1

## 2017-12-29 MED FILL — ACYCLOVIR 800 MG TAB: 800 mg | ORAL | Qty: 1

## 2017-12-29 MED FILL — MAGNESIUM OXIDE 400 MG TAB: 400 mg | ORAL | Qty: 1

## 2017-12-29 MED FILL — VANCOMYCIN 10 GRAM IV SOLR: 10 gram | INTRAVENOUS | Qty: 1250

## 2017-12-29 MED FILL — NYSTATIN 100,000 UNIT/ML ORAL SUSP: 100000 unit/mL | ORAL | Qty: 5

## 2017-12-29 MED FILL — POTASSIUM CHLORIDE SR 20 MEQ TAB, PARTICLES/CRYSTALS: 20 mEq | ORAL | Qty: 2

## 2017-12-29 MED FILL — CHOLECALCIFEROL (VITAMIN D3) 1,000 UNIT (25 MCG) TAB: ORAL | Qty: 5

## 2017-12-29 MED FILL — PANTOPRAZOLE 40 MG TAB, DELAYED RELEASE: 40 mg | ORAL | Qty: 1

## 2017-12-29 MED FILL — TACROLIMUS 1 MG CAP: 1 mg | ORAL | Qty: 4

## 2017-12-29 MED FILL — BUDESONIDE SR 3 MG 24 HR CAP: 3 mg | ORAL | Qty: 2

## 2017-12-29 NOTE — Progress Notes (Signed)
Progress  Notes by Kristen Loader, MD at 12/29/17 1657                Author: Kristen Loader, MD  Service: Hospitalist  Author Type: Physician       Filed: 12/30/17 0750  Date of Service: 12/29/17 1657  Status: Signed          Editor: Kristen Loader, MD (Physician)                                    Hospitalist Progress Note   Kristen Loader, MD   [facility]      Daily Progress Note: 12/29/2017        Assessment/Plan:        Possible TIA   Pt has multiple positive BCx with Staph epi.   PICC tip was positive for S epi.   Pt with TIA and faint murmur on exam.   Possiblility of septic emboli   Will need to r/o bacterial endocarditis due to BSI.   D/w Cards to check TEE 8/20      Staph epidermidis Sepsis   Immunocompromised pt   ID is following   Pt is on Bactrim and Vancomycin      Diplopia with transient slurred speech, LUE weakness, gait disturbances.    Possible TIA   Work-up as above   Sx have resolved.   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   I spoke with her lead BMT oncologist and he noted to continue treatment here for infxn and once stable for discharge to d/c home so she can go to [place] on her own.   Pt is on Tasigna 274m BID      Esophagitis and diarrhea d/t GVH dz    Seems to have improved.    Pt is on Compazine PRN      Oral thrush.   Added Nystatin (8/19)   Possible GVH, but will treat      Pancytopenia d/t above   Mild   Monitor      DM2 uncontrolled with hyperglycemia   Glucomander      Hypomagnesemia.   Replete and recheck   Pt noted she gets routine IV Mag   Continue PO magOx.      Chronic pain   APAP and Oxycodone PRN      DVT Prophylaxis.   SCDs   No heparin due to plt <100      Code status. Full code.      Disposition.   Plan for TEE 8/20   Continue abx per ID      ------------------      36 minutes  were spent on the patient of which more than 50% was spent in coordination of care and counseling (time spent with patient/family face to face, physical exam, reviewing laboratory and imaging investigations, speaking with physicians and nursing  staff involved in this patient's care)      ------------------   Physical exam:   General:   Alert, NAD, pleasant.  Cooperative   HEENT: Sclera anicteric, PERRL, OM moist, throat clear. Oral thrush   Chest:   CTA B, no wheeze, no rales, no rhonchi.  Good air movement.  CV: RRR, S1/S2 were normal, no murmur   Abdomen: NTND, soft, NABS, no masses were noted.  No rebound, no guarding.   Extremities: No  edema.        Subjective:     Pt noted that she feels okay.  We discussed the plans for a TEE in the morning.  We also discussed the fact that side called her primary transplant doctor in [place] and we reviewed  her current plan of care.  He also agreed with a TEE to evaluate for the BSI.  She was then amenable to plan of care as prior she was not wanting to do anything else.           Objective:        Visit Vitals      BP  113/71 (BP 1 Location: Right arm, BP Patient Position: Supine)     Pulse  80     Temp  98.7 ??F (37.1 ??C)     Resp  16     Ht  5' (1.524 m)     Wt  67.5 kg (148 lb 13 oz)     SpO2  100%        BMI  22.63 kg/m??         O2 Device: Room air      Temp (24hrs), Avg:98.3 ??F (36.8 ??C), Min:97.7 ??F (36.5 ??C), Max:98.9 ??F (37.2 ??C)     08/19 0701 - 08/19 1900   In: 300 [P.O.:300]   Out: -    08/17 1901 - 08/19 0700   In: 8341 [P.O.:720; I.V.:500]   Out: -            Data Review:          Recent Days:     Recent Labs           12/29/17   0440     WBC  4.1     HGB  9.4*     HCT  28.2*        PLT  64*          Recent Labs            12/29/17   0440  12/28/17   1201     NA  140   --      K  5.1   --      CL  107   --      CO2  28   --      GLU  236*   --      BUN  10   --      CREA  0.8  0.8     CA  8.9   --          MG  1.4*   --         No results for input(s): PH,  PCO2, PO2, HCO3, FIO2 in the last 72 hours.      24 Hour Results:     Recent Results (from the past 24 hour(s))     GLUCOSE, POC          Collection Time: 12/28/17  6:41 PM         Result  Value  Ref Range            Glucose (POC)  268 (H)  65 - 105 mg/dL       GLUCOSE, POC  Collection Time: 12/28/17  9:34 PM         Result  Value  Ref Range            Glucose (POC)  389 (H)  65 - 105 mg/dL       CBC WITH AUTOMATED DIFF          Collection Time: 12/29/17  4:40 AM         Result  Value  Ref Range            WBC  4.1  4.0 - 11.0 1000/mm3       NEUTROPHILS  77.7 (H)  34 - 64 %       LYMPHOCYTES  12.6 (L)  28 - 48 %       RBC  2.77 (L)  3.60 - 5.20 M/uL       MONOCYTES  6.8  1 - 13 %       HGB  9.4 (L)  13.0 - 17.2 gm/dl       HCT  28.2 (L)  37.0 - 50.0 %       MCV  101.8 (H)  80.0 - 98.0 fL       BAND NEUTROPHILS  2.9  0 - 11 %       MCH  33.9  25.4 - 34.6 pg       MCHC  33.3  30.0 - 36.0 gm/dl       PLATELET  64 (L)  140 - 450 1000/mm3       MPV  10.0  6.0 - 10.0 fL       RDW-SD  57.2 (H)  36.4 - 46.3         Anisocytosis  1          Polychromasia  1          PLATELET COMMENTS  DECREASED          METABOLIC PANEL, BASIC          Collection Time: 12/29/17  4:40 AM         Result  Value  Ref Range            Sodium  140  136 - 145 mEq/L       Potassium  5.1  3.5 - 5.1 mEq/L       Chloride  107  98 - 107 mEq/L       CO2  28  21 - 32 mEq/L       Glucose  236 (H)  74 - 106 mg/dl       BUN  10  7 - 25 mg/dl       Creatinine  0.8  0.6 - 1.3 mg/dl       GFR est AA  >60.0          GFR est non-AA  >60          Calcium  8.9  8.5 - 10.1 mg/dl       Anion gap  5  5 - 15 mmol/L       MAGNESIUM          Collection Time: 12/29/17  4:40 AM         Result  Value  Ref Range            Magnesium  1.4 (L)  1.6 - 2.6 mg/dl       GLUCOSE, POC  Collection Time: 12/29/17  9:41 AM         Result  Value  Ref Range            Glucose (POC)  218 (H)  65 - 105 mg/dL       GLUCOSE, POC          Collection Time: 12/29/17  2:19 PM          Result  Value  Ref Range            Glucose (POC)  217 (H)  65 - 105 mg/dL           Xr Chest Pa Lat      Result Date: 12/24/2017   Chest Indication: hx of GVHD with N/V/D.    Comparison: 05/20/2017 Findings: Frontal and lateral views of the chest demonstrate that the cardiac silhouette is not enlarged.  There is no focal infiltrate, pleural effusion, vascular congestion, or pneumothorax.   Left PICC with tip overlying the distal superior vena cava.       Impression: No acute cardiopulmonary disease.       Mri Brain Wo Cont      Result Date: 12/26/2017   EXAMINATION: MRI BRAIN WO CONT CLINICAL INDICATION: Diplopia; TIA bone marrow transplant. Blurred vision COMPARISON:  None. TECHNIQUE: T1 and T2-weighted sequences of the brain were obtained in multiple planes without contrast. FINDINGS: No restricted  diffusion. No masses, no midline shift. No hydrocephalus. No extra-axial fluid collections. Visualized portions of the orbits grossly unremarkable. Mastoid air cells well aerated. Visualized portions of paranasal sinuses unremarkable. Intracranial flow  voids of great vessels, grossly intact. Diffuse low signal marrow. Age-appropriate atrophy. No susceptibility artifact on gradient echo imaging to suggest hemorrhage. Cerebellar tonsil extends into the foramen magnum 2 mm or so. Pituitary gland normal.  Orbits appear normal. No compression of optic chiasm extraocular muscles are normal in size. No orbital masses. Several foci of T2 prolongation in corona radiata and centrum semiovale. Largest in the right frontal lobe measures 8 mm and in the left parietal  lobe millimeters 10 mm. Several additional smaller lesions are noted in the frontal and parietal lobes.       IMPRESSION: 1.   Nonspecific white matter hyperintensities likely due to chronic microvascular ischemic changes. Iatrogenic drug toxicity, graft-versus-host, inflammatory/infectious, hypertension, demyelinating process, trauma, chronic headaches, sequelae  of  vasculitis are other considerations. 2. Low-lying cerebellar tonsils. 3. No orbital masses. No infarction.       Mri Brain W Cont      Result Date: 12/27/2017   MRI OF THE BRAIN INDICATION: diplopia.    COMPARISON: Noncontrast MRI 12/26/2017 TECHNIQUE: Multiplanar, multisequence MRI of the brain was performed without intravenous contrast.  FINDINGS: The patient returned for contrast-enhanced imaging of the brain.  Images are degraded by patient motion artifacts. However, there is no abnormal parenchymal, dural, or meningeal enhancement. No gross sellar mass.       IMPRESSION: 1.  No abnormal enhancement to suggest active infectious/inflammatory/neoplastic process       Ct Head Wo Cont      Result Date: 12/25/2017   DICOM format image data is available to non-affiliated external healthcare facilities or entities on a secure, media free, reciprocally searchable basis with patient authorization for 12 months following the date of the study. Clinical history: Diplopia  EXAMINATION: Noncontrast CT scan of the head 12/24/2017. 4 mm axial scanning is performed from the skull base to the vertex. Coronal and sagittal reconstruction imaging  has been obtained. Correlation: None FINDINGS: Visualized paranasal sinuses and mastoid  air cells are clear. No acute intracranial hemorrhage, mass or infarction.       IMPRESSION: Normal unenhanced CT scan of the head. Initial interpretation: [facility]       Cta Head      Result Date: 12/26/2017   CTA OF THE HEAD INDICATION: Nausea, sweating, vomiting, diarrhea COMPARISON: MRI 12/26/2017, CT 12/24/2017 TECHNIQUE: Noncontrast CT the head performed followed by CTA of the head with intravenous contrast. Multiplanar two-dimensional and three-dimensional  maximum intensity projection reformats performed and reviewed. DICOM format image data is available to non-affiliated external healthcare facilities or entities on a secure, media free, reciprocally searchable basis with patient  authorization for 12 months  following the date of the study. FINDINGS: Noncontrast CT of the head demonstrates no acute segmental infarct, intracranial hemorrhage, mass, midline shift, extra-axial fluid collection, or hydrocephalus. The vertebral arteries are codominant. There is  no focal hemodynamically significant stenosis within the anterior or posterior circulation. The anterior and posterior communicating arteries appear patent. There is no definitive aneurysm.       IMPRESSION: No focal hemodynamically significant stenosis.       Cta Neck      Result Date: 12/26/2017   CTA OF THE NECK WITH CONTRAST INDICATION: Speech difficulty, and balance COMPARISON: No relevant studies. TECHNIQUE: CTA of the neck was performed with nonionic intravenous contrast with multiplanar 2-dimensional and maximum intensity projection three-dimensional  images. Stenosis was evaluated using the NASCET criteria. DICOM format image data is available to non-affiliated external healthcare facilities or entities on a secure, media free, reciprocally searchable basis with patient authorization for 12 months  following the date of the study. FINDINGS: There is no hemodynamically significant stenosis within the carotid or vertebral arteries. There is no evidence of dissection. CAROTID STENOSIS REFERENCE USING NASCET CRITERIA % Stenosis = (1 - narrowest diameter/diameter  of distal artery) x100 Mild: < 50% stenosis Moderate: 50-69% stenosis. Severe: 70-94% stenosis. Near occlusion: 95-99% stenosis. Occluded: 100% stenosis       IMPRESSION: No focal hemodynamically significant stenosis.          Microbiology:     All Micro Results               Procedure  Component  Value  Units  Date/Time           CULTURE, BLOOD [[id number]]  Collected:  12/28/17 0249            Order Status:  Completed  Specimen:  Blood  Updated:  12/29/17 0802                Blood Culture Result  No Growth At 24 Hours                CULTURE, CATHETER TIP [[id number]]   (Abnormal)  (Susceptibility)  Collected:  12/27/17 1550            Order Status:  Completed  Specimen:  Catheter Tip from PICC  Updated:  12/29/17 0651                Isolate  --                    >15 CFU   Staphylococcus epidermidis                CULTURE, BLOOD [[id number]]  Collected:  12/28/17 0249  Order Status:  Completed  Specimen:  Blood  Updated:  12/29/17 0605                Blood Culture Result  --                  Gram stain Gram Positive Cocci In Clusters   Called to/read back DEREK BOZMAN 6EST   on 12/29/2017   at 0600   by VGP              CULTURE, BLOOD [[id number]]  (Abnormal)  Collected:  12/26/17 1240            Order Status:  Completed  Specimen:  Blood from [facility] Picc  Updated:  12/28/17 0915               Blood Culture Result                 Gram stain Gram Positive Cocci In Clusters                          Isolate  --                    Staphylococcus epidermidis   See Previous Culture For Susceptibility Report                CULTURE, BLOOD [[id number]]  (Abnormal)  Collected:  12/26/17 1246            Order Status:  Completed  Specimen:  Blood from PICC  Updated:  12/28/17 0913                Blood Culture Result  --                  Gram stain Gram Positive Cocci In Clusters   Called to/read back sissy   on 12/27/17   at [place]   by [id number]                   Isolate  --                    Staphylococcus epidermidis   See Previous Culture For Susceptibility Report                CULTURE, BLOOD [[id number]]  (Abnormal)  Collected:  12/24/17 2255            Order Status:  Completed  Specimen:  Blood from [facility] Picc  Updated:  12/27/17 0910                Blood Culture Result  --                  Gram stain Gram Positive Cocci In Clusters   Called to/read back jannet   on 12/25/17   at 1751   by [id number]                   Isolate  --                    See Previous Culture For Susceptibility Report   Staphylococcus epidermidis                CULTURE, BLOOD [[id number]]  (Abnormal)   (Susceptibility)  Collected:  12/24/17 2230            Order Status:  Completed  Specimen:  Blood from [employer]  Picc  Updated:  12/27/17 0749                Blood Culture Result  --                  Gram stain Gram Positive Cocci In Clusters   Called to/read back jannet   on 12/25/17   at 1751   by [id number]                  Isolate                 Staphylococcus epidermidis                       CULTURE, BLOOD FOR MRSA/SA BY PCR [[id number]]  Collected:  12/24/17 0951            Order Status:  Completed  Specimen:  Blood  Updated:  12/26/17 1133                Blood culture, MRSA by PCR  NEGATIVE              Blood culture, S. aureus by PCR  NEGATIVE              Positive culture accn number  [id number]                  Comment:  This PCR testing was performed from the blood culture accession number that is noted. Refer to   this accession number for antibiotic susceptibility testing and final identification.                        CULTURE, URINE [[id number]]  (Abnormal)  Collected:  12/24/17 2255            Order Status:  Completed  Specimen:  Urine from Clean catch  Updated:  12/26/17 1021                Culture result  --                    30,000 CFU/mL   Mixed Flora, three or more different organisms present   No Further Workup                      Cardiology:     Results for orders placed or performed during the hospital encounter of 12/24/17     EKG, 12 LEAD, INITIAL         Result  Value  Ref Range            Ventricular Rate  104  BPM       Atrial Rate  104  BPM       P-R Interval  148  ms       QRS Duration  66  ms       Q-T Interval  328  ms       QTC Calculation (Bezet)  431  ms       Calculated P Axis  18  degrees       Calculated R Axis  17  degrees       Calculated T Axis  16  degrees       Diagnosis                 Sinus tachycardia   Otherwise normal ECG   No previous ECGs available  Confirmed by Marrion Coy, M.D., Ellin Saba. (30) on 12/25/2017 7:41:35 AM                 Problem List:      Problem List as of  12/29/2017  Date Reviewed:  01/05/2018                        Codes  Class  Noted - Resolved             Bacteremia  ICD-10-CM: R78.81   ICD-9-CM: 790.7    12/26/2017 - Present                       TIA (transient ischemic attack)  ICD-10-CM: G45.9   ICD-9-CM: 435.9    12/25/2017 - Present                       Thrombocytopenia ([place])  ICD-10-CM: D69.6   ICD-9-CM: 287.5    05/13/2017 - Present                       Neutropenic fever (HCC)  ICD-10-CM: D70.9, R50.81   ICD-9-CM: 288.00, 780.61    05/13/2017 - Present                       Chest pressure  ICD-10-CM: R07.89   ICD-9-CM: 786.59    04/25/2017 - Present                       Elevated troponin  ICD-10-CM: R74.8   ICD-9-CM: 790.6    04/25/2017 - Present                          Medications reviewed     Current Facility-Administered Medications          Medication  Dose  Route  Frequency           ?  nystatin (MYCOSTATIN) 100,000 unit/mL oral suspension 500,000 Units   500,000 Units  Oral  QID     ?  magnesium sulfate 4 g/100 mL IVPB   4 g  IntraVENous  ONCE     ?  vancomycin (VANCOCIN) 1,250 mg in 0.9% sodium chloride 250 mL IVPB   1,250 mg  IntraVENous  Q12H     ?  sodium chloride (NS) flush 10 mL   10 mL  InterCATHeter  DAILY     ?  sodium chloride (NS) flush 10-20 mL   10-20 mL  InterCATHeter  PRN     ?  heparin (porcine) pf 50 Units   50 Units  InterCATHeter  PRN     ?  heparin (porcine) pf 50 Units   50 Units  InterCATHeter  DAILY     ?  DULoxetine (CYMBALTA) capsule 20 mg   20 mg  Oral  DAILY     ?  magnesium oxide (MAG-OX) tablet 400 mg   400 mg  Oral  BID     ?  pantoprazole (PROTONIX) tablet 40 mg   40 mg  Oral  DAILY     ?  acyclovir (ZOVIRAX) tablet 400 mg   400 mg  Oral  BID     ?  cholecalciferol (VITAMIN D3) tablet 5,000 Units   5,000 Units  Oral  DAILY     ?  dextrose (D50) infusion 5-25 g  10-50 mL  IntraVENous  PRN     ?  glucagon (GLUCAGEN) injection 1 mg   1 mg  IntraMUSCular  PRN     ?  insulin glargine (LANTUS) injection 1-100 Units   1-100 Units   SubCUTAneous  QHS     ?  insulin lispro (HUMALOG) injection 1-100 Units   1-100 Units  SubCUTAneous  AC&HS     ?  insulin lispro (HUMALOG) injection 1-100 Units   1-100 Units  SubCUTAneous  PRN     ?  oxyCODONE IR (ROXICODONE) tablet 10 mg   10 mg  Oral  Q4H PRN           ?  potassium chloride (K-DUR, KLOR-CON) SR tablet 40 mEq   40 mEq  Oral  BID           ?  tacrolimus (PROGRAF) capsule 4 mg   4 mg  Oral  BID     ?  prochlorperazine (COMPAZINE) tablet 10 mg   10 mg  Oral  Q4H PRN     ?  zolpidem (AMBIEN) tablet 5 mg   5 mg  Oral  QHS PRN     ?  trimethoprim-sulfamethoxazole (BACTRIM DS, SEPTRA DS) 160-800 mg per tablet 1 Tab   1 Tab  Oral  Q MON, WED & FRI     ?  naloxone (NARCAN) injection 0.1 mg   0.1 mg  IntraVENous  PRN     ?  acetaminophen (TYLENOL) tablet 650 mg   650 mg  Oral  Q4H PRN          Or           ?  acetaminophen (TYLENOL) solution 650 mg   650 mg  Oral  Q4H PRN          Or           ?  acetaminophen (TYLENOL) suppository 650 mg   650 mg  Rectal  Q4H PRN     ?  nilotinib (TASIGNA) capsule cap 200 mg  (Patient Supplied)   200 mg  Oral  BID     ?  LORazepam (ATIVAN) tablet 1 mg   1 mg  Oral  ON CALL     ?  budesonide (ENTOCORT EC) capsule 6 mg   6 mg  Oral  TID           ?  *Pharmacy dosing vancomycin   1 Each  Other  Rx Dosing/Monitoring              Kristen Loader, MD

## 2017-12-29 NOTE — Progress Notes (Signed)
Progress  Notes by Jearld Adjutant, MD at 12/29/17 1150                Author: Jearld Adjutant, MD  Service: Infectious Disease  Author Type: Physician       Filed: 12/29/17 1829  Date of Service: 12/29/17 1150  Status: Signed          Editor: Jearld Adjutant, MD (Physician)                            Infectious Disease Follow-up       Admit Date: 12/24/2017      Today's Date: 12/29/17         Current abx  Prior abx        Prophylactic Bactrim tiw, ACV; vancomycin  8/16-3  meropenem 8/16-1          Assessment:          Pos bctx's GPC cl 2 of 2 8/14-MRSE, suspect early picc infection    -repeat bctx's 8/16 2 of 2 GPC cl incl from picc    -TTE no reported vegetation, bubble study negative      AML sp chemo/stem cell xplant   -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 need TEE. Discussed with pt and Dr Nira Retort        If 8/19 bl cx neg after 2-3 days may replace picc -- pt says need iv access 2x/week;        -> reviewed repeat MRI - neg       defer w/u for diplopia to Neurology, appears holding on LP for now, pt says better this am         MICROBIOLOGY:     8/14 bctx x 2 MRSE    uctx ngtd   8/16 bctx x 2 S epi see prior   8/17     Cath tip > S epi   8/18 bctx x 2 ip- 1 of 2  G+ cocci        LINES AND CATHETERS:     PICC        Active Hospital Problems           Diagnosis  Date Noted         ?  Bacteremia  12/26/2017          ?  TIA (transient ischemic attack)  12/25/2017  Subjective:        Interval notes reviewed. Pt says feels improved, no diplopia  this am. picc out tip cultured + S epi. Concerned about getting rx for GVHD at [facility].  Dr Nira Retort to discuss with her tx MD        Current Facility-Administered Medications          Medication  Dose  Route  Frequency           ?  nystatin (MYCOSTATIN) 100,000 unit/mL oral suspension 500,000 Units   500,000 Units  Oral  QID     ?  vancomycin (VANCOCIN) 1,250 mg in 0.9% sodium chloride 250 mL IVPB   1,250 mg  IntraVENous  Q12H     ?  sodium chloride (NS) flush 10 mL   10 mL  InterCATHeter  DAILY     ?  sodium chloride (NS) flush 10-20 mL   10-20 mL  InterCATHeter  PRN     ?  heparin (porcine) pf 50 Units   50 Units  InterCATHeter  PRN     ?  heparin (porcine) pf 50 Units   50 Units  InterCATHeter  DAILY     ?  DULoxetine (CYMBALTA) capsule 20 mg   20 mg  Oral  DAILY     ?  magnesium oxide (MAG-OX) tablet 400 mg   400 mg  Oral  BID     ?  pantoprazole (PROTONIX) tablet 40 mg   40 mg  Oral  DAILY     ?  acyclovir (ZOVIRAX) tablet 400 mg   400 mg  Oral  BID     ?  cholecalciferol (VITAMIN D3) tablet 5,000 Units   5,000 Units  Oral  DAILY     ?  dextrose (D50) infusion 5-25 g   10-50 mL  IntraVENous  PRN           ?  glucagon (GLUCAGEN) injection 1 mg   1 mg  IntraMUSCular  PRN           ?  insulin glargine (LANTUS) injection 1-100 Units   1-100 Units  SubCUTAneous  QHS     ?  insulin lispro (HUMALOG) injection 1-100 Units   1-100 Units  SubCUTAneous  AC&HS     ?  insulin lispro (HUMALOG) injection 1-100 Units   1-100 Units  SubCUTAneous  PRN     ?  oxyCODONE IR (ROXICODONE) tablet 10 mg   10 mg  Oral  Q4H PRN     ?  potassium chloride (K-DUR, KLOR-CON) SR tablet 40 mEq   40 mEq  Oral  BID     ?  tacrolimus (PROGRAF) capsule 4 mg   4 mg  Oral  BID     ?  prochlorperazine (COMPAZINE) tablet 10 mg   10 mg  Oral  Q4H PRN     ?  zolpidem (AMBIEN) tablet 5 mg   5 mg  Oral  QHS PRN      ?  trimethoprim-sulfamethoxazole (BACTRIM DS, SEPTRA DS) 160-800 mg per tablet 1 Tab   1 Tab  Oral  Q MON, WED & FRI     ?  naloxone (NARCAN) injection 0.1 mg   0.1 mg  IntraVENous  PRN     ?  acetaminophen (TYLENOL) tablet 650 mg   650 mg  Oral  Q4H PRN          Or           ?  acetaminophen (TYLENOL) solution 650 mg   650 mg  Oral  Q4H PRN          Or           ?  acetaminophen (TYLENOL) suppository 650 mg   650 mg  Rectal  Q4H PRN     ?  nilotinib (TASIGNA) capsule cap 200 mg  (Patient Supplied)   200 mg  Oral  BID     ?  LORazepam (ATIVAN) tablet 1 mg   1 mg  Oral  ON CALL     ?  budesonide (ENTOCORT EC) capsule 6 mg   6 mg  Oral  TID           ?  *Pharmacy dosing vancomycin   1 Each  Other  Rx Dosing/Monitoring                Objective:        Visit Vitals      BP  137/87 (BP 1 Location: Right arm, BP Patient Position: Sitting)     Pulse  78     Temp  97.9 ??F (36.6 ??C)     Resp  14     Ht  5' (1.524 m)     Wt  67.5 kg (148 lb 13 oz)     SpO2  100%        BMI  22.63 kg/m??           Temp (24hrs), Avg:98.2 ??F (36.8 ??C), Min:97.9 ??F (36.6 ??C), Max:98.9 ??F (37.2 ??C)      GEN: WFWN BF appears stated age lying in bed NAD    HEENT: anicteric sclerae moist orophyarynx   NECK: Supple    CHEST: no rales rhonchi or wheeze   CVS: S1'S2' no rub murmur or gallop   ABD: soft non-tender (+) BS no masses non-distended   EXT: no edema LUE picc out          Labs:  Results:        Chemistry  Recent Labs         12/29/17   0440  12/28/17   1201      GLU  236*   --       NA  140   --       K  5.1   --       CL  107   --       CO2  28   --       BUN  10   --       CREA  0.8  0.8      CA  8.9   --       AGAP  5   --                  CBC w/Diff  Recent Labs         12/29/17   0440      WBC  4.1      RBC  2.77*      HGB  9.4*      HCT  28.2*      PLT  64*      GRANS  77.7*      LYMPH  12.6*                 Mri brain 8/16 IMPRESSION:   1.   Nonspecific white matter hyperintensities likely due to chronic   microvascular  ischemic  changes. 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   2. Low-lying cerebellar tonsils.   3. No orbital masses. No infarction      8/17 mri IMPRESSION:   ??   1.  No abnormal enhancement to suggest active infectious/inflammatory/neoplastic   process      Echo 8/16 Interpretation Summary  A complete two-dimensional transthoracic echocardiogram was performed (2D, M-  mode, Doppler and color flow Doppler).  The study was technically adequate.    Small left ventricle, norla wall thickness  with normal systolic function and   a  calculated ejectio fraction of 77%.  No regional wall motion abnormalites are noted.  Grade 1 diastolic dysfunction.  Normal size right ventricle, normal systolic function.  No hemodynamic  valvular pathology or signficiant regurgitation.  Estimated right ventricular systolic pressure: 78GNFA.  Negative bubble study for interatrial shunting.      Microbiology Results   No results for input(s): SDES, CULT in the last 72 hours.      Jearld Adjutant, MD   December 29, 2017   [facility]   [phone]

## 2017-12-29 NOTE — Progress Notes (Signed)
PAGER ID: [phone]   MESSAGE: 1884: [name] Wu. Pts IV infiltrated and needs a new one. She requests the PICC team. Otilio Carpen #[id number]      1715:   PAGER ID: [phone]   MESSAGE: [phone]: [name] Wu. Pt agrees to have an IV when you get a chance. Thanks, [employer] [phone]

## 2017-12-29 NOTE — Progress Notes (Signed)
Hospitalist Progress Note  Kristen Loader, MD  [facility] Hospitalists    Daily Progress Note: 12/29/2017    Assessment/Plan:     Possible TIA  Pt has multiple positive BCx with Staph epi.  PICC tip was positive for S epi.  Pt with TIA and faint murmur on exam.  Possiblility of septic emboli  Will need to r/o bacterial endocarditis due to BSI.  D/w Cards to check TEE 8/20    Staph epidermidis Sepsis  Immunocompromised pt  ID is following  Pt is on Bactrim and Vancomycin    Diplopia with transient slurred speech, LUE weakness, gait disturbances.   Possible TIA  Work-up as above  Sx have resolved.  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  I spoke with her lead BMT oncologist and he noted to continue treatment here for infxn and once stable for discharge to d/c home so she can go to [place] on her own.  Pt is on Tasigna 249m BID    Esophagitis and diarrhea d/t GVH dz   Seems to have improved.   Pt is on Compazine PRN    Oral thrush.  Added Nystatin (8/19)  Possible GVH, but will treat    Pancytopenia d/t above  Mild  Monitor    DM2 uncontrolled with hyperglycemia  Glucomander    Hypomagnesemia.  Replete and recheck  Pt noted she gets routine IV Mag  Continue PO magOx.    Chronic pain  APAP and Oxycodone PRN    DVT Prophylaxis.  SCDs  No heparin due to plt <100    Code status. Full code.    Disposition.  Plan for TEE 8/20  Continue abx per ID    ------------------    36 minutes were spent on the patient of which more than 50% was spent in coordination of care and counseling (time spent with patient/family face to face, physical exam, reviewing laboratory and imaging investigations, speaking with physicians and nursing staff involved in this patient's care)    ------------------  Physical exam:   General:  Alert, NAD, pleasant.  Cooperative  HEENT: Sclera anicteric, PERRL, OM moist, throat clear. Oral thrush  Chest:  CTA B, no wheeze, no rales, no rhonchi.  Good air movement.  CV: RRR, S1/S2 were normal, no murmur  Abdomen: NTND, soft, NABS, no masses were noted.  No rebound, no guarding.  Extremities: No edema.    Subjective:   Pt noted that she feels okay.  We discussed the plans for a TEE in the morning.  We also discussed the fact that [place].  He also agreed with a TEE to evaluate for the BSI.  She was then amenable to plan of care as prior she was not wanting to do anything else.      Objective:     Visit Vitals  BP 113/71 (BP 1 Location: Right arm, BP Patient Position: Supine)   Pulse 80   Temp 98.7 ??F (37.1 ??C)   Resp 16   Ht 5' (1.524 m)   Wt 67.5 kg (148 lb 13 oz)   SpO2 100%   BMI 22.63 kg/m??      O2 Device: Room air    Temp (24hrs), Avg:98.3 ??F (36.8 ??C), Min:97.7 ??F (36.5 ??C),  Max:98.9 ??F (37.2 ??C)    08/19 0701 - 08/19 1900  In: 300 [P.O.:300]  Out: -    08/17 1901 - 08/19 0700  In: 0932 [P.O.:720; I.V.:500]  Out: -       Data Review:       Recent Days:  Recent Labs     12/29/17  0440   WBC 4.1   HGB 9.4*   HCT 28.2*   PLT 64*     Recent Labs     12/29/17  0440 12/28/17  1201   NA 140  --    K 5.1  --    CL 107  --    CO2 28  --    GLU 236*  --    BUN 10  --    CREA 0.8 0.8   CA 8.9  --    MG 1.4*  --      No results for input(s): PH, PCO2, PO2, HCO3, FIO2 in the last 72 hours.    24 Hour Results:  Recent Results (from the past 24 hour(s))   GLUCOSE, POC    Collection Time: 12/28/17  6:41 PM   Result Value Ref Range    Glucose (POC) 268 (H) 65 - 105 mg/dL   GLUCOSE, POC    Collection Time: 12/28/17  9:34 PM   Result Value Ref Range    Glucose (POC) 389 (H) 65 - 105 mg/dL   CBC WITH AUTOMATED DIFF    Collection Time: 12/29/17  4:40 AM   Result Value Ref Range    WBC 4.1 4.0 - 11.0 1000/mm3     NEUTROPHILS 77.7 (H) 34 - 64 %    LYMPHOCYTES 12.6 (L) 28 - 48 %    RBC 2.77 (L) 3.60 - 5.20 M/uL    MONOCYTES 6.8 1 - 13 %    HGB 9.4 (L) 13.0 - 17.2 gm/dl    HCT 28.2 (L) 37.0 - 50.0 %    MCV 101.8 (H) 80.0 - 98.0 fL    BAND NEUTROPHILS 2.9 0 - 11 %    MCH 33.9 25.4 - 34.6 pg    MCHC 33.3 30.0 - 36.0 gm/dl    PLATELET 64 (L) 140 - 450 1000/mm3    MPV 10.0 6.0 - 10.0 fL    RDW-SD 57.2 (H) 36.4 - 46.3      Anisocytosis 1      Polychromasia 1      PLATELET COMMENTS DECREASED     METABOLIC PANEL, BASIC    Collection Time: 12/29/17  4:40 AM   Result Value Ref Range    Sodium 140 136 - 145 mEq/L    Potassium 5.1 3.5 - 5.1 mEq/L    Chloride 107 98 - 107 mEq/L    CO2 28 21 - 32 mEq/L    Glucose 236 (H) 74 - 106 mg/dl    BUN 10 7 - 25 mg/dl    Creatinine 0.8 0.6 - 1.3 mg/dl    GFR est AA >60.0      GFR est non-AA >60      Calcium 8.9 8.5 - 10.1 mg/dl    Anion gap 5 5 - 15 mmol/L   MAGNESIUM    Collection Time: 12/29/17  4:40 AM   Result Value Ref Range    Magnesium 1.4 (L) 1.6 - 2.6 mg/dl   GLUCOSE, POC    Collection Time: 12/29/17  9:41 AM   Result Value Ref Range    Glucose (POC) 218 (  H) 65 - 105 mg/dL   GLUCOSE, POC    Collection Time: 12/29/17  2:19 PM   Result Value Ref Range    Glucose (POC) 217 (H) 65 - 105 mg/dL       Xr Chest Pa Lat    Result Date: 12/24/2017  Chest Indication: hx of GVHD with N/V/D.    Comparison: 05/20/2017 Findings: Frontal and lateral views of the chest demonstrate that the cardiac silhouette is not enlarged.  There is no focal infiltrate, pleural effusion, vascular congestion, or pneumothorax.  Left PICC with tip overlying the distal superior vena cava.     Impression: No acute cardiopulmonary disease.     Mri Brain Wo Cont    Result Date: 12/26/2017  EXAMINATION: MRI BRAIN WO CONT CLINICAL INDICATION: Diplopia; TIA bone marrow transplant. Blurred vision COMPARISON:  None. TECHNIQUE: T1 and T2-weighted sequences of the brain were obtained in multiple planes  without contrast. FINDINGS: No restricted diffusion. No masses, no midline shift. No hydrocephalus. No extra-axial fluid collections. Visualized portions of the orbits grossly unremarkable. Mastoid air cells well aerated. Visualized portions of paranasal sinuses unremarkable. Intracranial flow voids of great vessels, grossly intact. Diffuse low signal marrow. Age-appropriate atrophy. No susceptibility artifact on gradient echo imaging to suggest hemorrhage. Cerebellar tonsil extends into the foramen magnum 2 mm or so. Pituitary gland normal. Orbits appear normal. No compression of optic chiasm extraocular muscles are normal in size. No orbital masses. Several foci of T2 prolongation in corona radiata and centrum semiovale. Largest in the right frontal lobe measures 8 mm and in the left parietal lobe millimeters 10 mm. Several additional smaller lesions are noted in the frontal and parietal lobes.     IMPRESSION: 1.   Nonspecific white matter hyperintensities likely due to chronic microvascular ischemic changes. Iatrogenic drug toxicity, graft-versus-host, inflammatory/infectious, hypertension, demyelinating process, trauma, chronic headaches, sequelae of vasculitis are other considerations. 2. Low-lying cerebellar tonsils. 3. No orbital masses. No infarction.     Mri Brain W Cont    Result Date: 12/27/2017  MRI OF THE BRAIN INDICATION: diplopia.    COMPARISON: Noncontrast MRI 12/26/2017 TECHNIQUE: Multiplanar, multisequence MRI of the brain was performed without intravenous contrast.  FINDINGS: The patient returned for contrast-enhanced imaging of the brain. Images are degraded by patient motion artifacts. However, there is no abnormal parenchymal, dural, or meningeal enhancement. No gross sellar mass.     IMPRESSION: 1.  No abnormal enhancement to suggest active infectious/inflammatory/neoplastic process     Ct Head Wo Cont    Result Date: 12/25/2017   DICOM format image data is available to non-affiliated external healthcare facilities or entities on a secure, media free, reciprocally searchable basis with patient authorization for 12 months following the date of the study. Clinical history: Diplopia EXAMINATION: Noncontrast CT scan of the head 12/24/2017. 4 mm axial scanning is performed from the skull base to the vertex. Coronal and sagittal reconstruction imaging has been obtained. Correlation: None FINDINGS: Visualized paranasal sinuses and mastoid air cells are clear. No acute intracranial hemorrhage, mass or infarction.     IMPRESSION: Normal unenhanced CT scan of the head. Initial interpretation: [employer]     Cta Head    Result Date: 12/26/2017  CTA OF THE HEAD INDICATION: Nausea, sweating, vomiting, diarrhea COMPARISON: MRI 12/26/2017, CT 12/24/2017 TECHNIQUE: Noncontrast CT the head performed followed by CTA of the head with intravenous contrast. Multiplanar two-dimensional and three-dimensional maximum intensity projection reformats performed and reviewed. DICOM format image data is available to non-affiliated external  healthcare facilities or entities on a secure, media free, reciprocally searchable basis with patient authorization for 12 months following the date of the study. FINDINGS: Noncontrast CT of the head demonstrates no acute segmental infarct, intracranial hemorrhage, mass, midline shift, extra-axial fluid collection, or hydrocephalus. The vertebral arteries are codominant. There is no focal hemodynamically significant stenosis within the anterior or posterior circulation. The anterior and posterior communicating arteries appear patent. There is no definitive aneurysm.     IMPRESSION: No focal hemodynamically significant stenosis.     Cta Neck    Result Date: 12/26/2017  CTA OF THE NECK WITH CONTRAST INDICATION: Speech difficulty, and balance COMPARISON: No relevant studies. TECHNIQUE: CTA of the neck was performed  with nonionic intravenous contrast with multiplanar 2-dimensional and maximum intensity projection three-dimensional images. Stenosis was evaluated using the NASCET criteria. DICOM format image data is available to non-affiliated external healthcare facilities or entities on a secure, media free, reciprocally searchable basis with patient authorization for 12 months following the date of the study. FINDINGS: There is no hemodynamically significant stenosis within the carotid or vertebral arteries. There is no evidence of dissection. CAROTID STENOSIS REFERENCE USING NASCET CRITERIA % Stenosis = (1 - narrowest diameter/diameter of distal artery) x100 Mild: < 50% stenosis Moderate: 50-69% stenosis. Severe: 70-94% stenosis. Near occlusion: 95-99% stenosis. Occluded: 100% stenosis     IMPRESSION: No focal hemodynamically significant stenosis.       Microbiology:  All Micro Results     Procedure Component Value Units Date/Time    CULTURE, BLOOD [[id number]] Collected:  12/28/17 0249    Order Status:  Completed Specimen:  Blood Updated:  12/29/17 0802     Blood Culture Result No Growth At 24 Hours       CULTURE, CATHETER TIP [[id number]]  (Abnormal)  (Susceptibility) Collected:  12/27/17 1550    Order Status:  Completed Specimen:  Catheter Tip from PICC Updated:  12/29/17 0651     Isolate --        >15 CFU  Staphylococcus epidermidis      CULTURE, BLOOD [[id number]] Collected:  12/28/17 0249    Order Status:  Completed Specimen:  Blood Updated:  12/29/17 0605     Blood Culture Result --        Gram stain Gram Positive Cocci In Clusters  Called to/read back DEREK BOZMAN 6EST  on 12/29/2017  at 0600  by [facility]      CULTURE, BLOOD [[id number]]  (Abnormal) Collected:  12/26/17 1240    Order Status:  Completed Specimen:  Blood from [facility] Picc Updated:  12/28/17 0915     Blood Culture Result       Gram stain Gram Positive Cocci In Clusters           Isolate --        Staphylococcus epidermidis   See Previous Culture For Susceptibility Report      CULTURE, BLOOD [[id number]]  (Abnormal) Collected:  12/26/17 1246    Order Status:  Completed Specimen:  Blood from PICC Updated:  12/28/17 0913     Blood Culture Result --        Gram stain Gram Positive Cocci In Clusters  Called to/read back sissy  on 12/27/17  at [place]  by [id number]       Isolate --        Staphylococcus epidermidis  See Previous Culture For Susceptibility Report      CULTURE, BLOOD [[id number]]  (Abnormal) Collected:  12/24/17 2255    Order Status:  Completed Specimen:  Blood from [facility] Picc Updated:  12/27/17 0910     Blood Culture Result --        Gram stain Gram Positive Cocci In Clusters  Called to/read back jannet  on 12/25/17  at 1751  by [id number]       Isolate --        See Previous Culture For Susceptibility Report  Staphylococcus epidermidis      CULTURE, BLOOD [[id number]]  (Abnormal)  (Susceptibility) Collected:  12/24/17 2230    Order Status:  Completed Specimen:  Blood from [facility] Picc Updated:  12/27/17 0749     Blood Culture Result --        Gram stain Gram Positive Cocci In Clusters  Called to/read back jannet  on 12/25/17  at 1751  by [id number]       Isolate       Staphylococcus epidermidis          CULTURE, BLOOD FOR MRSA/SA BY PCR [[id number]] Collected:  12/24/17 0951    Order Status:  Completed Specimen:  Blood Updated:  12/26/17 1133     Blood culture, MRSA by PCR NEGATIVE        Blood culture, S. aureus by PCR NEGATIVE        Positive culture accn number [id number]        Comment: This PCR testing was performed from the blood culture accession number that is noted. Refer to  this accession number for antibiotic susceptibility testing and final identification.         CULTURE, URINE [[id number]]  (Abnormal) Collected:  12/24/17 2255    Order Status:  Completed Specimen:  Urine from [facility] catch Updated:  12/26/17 1021     Culture result --        30,000 CFU/mL  Mixed Flora, three or more different organisms present  No Further Workup              Cardiology:  Results for orders placed or performed during the hospital encounter of 12/24/17   EKG, 12 LEAD, INITIAL   Result Value Ref Range    Ventricular Rate 104 BPM    Atrial Rate 104 BPM    P-R Interval 148 ms    QRS Duration 66 ms    Q-T Interval 328 ms    QTC Calculation (Bezet) 431 ms    Calculated P Axis 18 degrees    Calculated R Axis 17 degrees    Calculated T Axis 16 degrees    Diagnosis       Sinus tachycardia  Otherwise normal ECG  No previous ECGs available  Confirmed by Marrion Coy, M.D., Ellin Saba. (30) on 12/25/2017 7:41:35 AM           Problem List:  Problem List as of 12/29/2017 Date Reviewed: 25-Jan-2018          Codes Class Noted - Resolved    Bacteremia ICD-10-CM: R78.81  ICD-9-CM: 790.7  12/26/2017 - Present        TIA (transient ischemic attack) ICD-10-CM: G45.9  ICD-9-CM: 435.9  12/25/2017 - Present        Thrombocytopenia ([place]) ICD-10-CM: D69.6  ICD-9-CM: 287.5  05/13/2017 - Present        Neutropenic fever ([place]) ICD-10-CM: D70.9, R50.81  ICD-9-CM: 288.00, 780.61  05/13/2017 - Present        Chest pressure ICD-10-CM: R07.89  ICD-9-CM: 786.59  04/25/2017 - Present        Elevated troponin ICD-10-CM: R74.8  ICD-9-CM:  790.6  04/25/2017 - Present              Medications reviewed  Current Facility-Administered Medications   Medication Dose Route Frequency   ??? nystatin (MYCOSTATIN) 100,000 unit/mL oral suspension 500,000 Units  500,000 Units Oral QID   ??? magnesium sulfate 4 g/100 mL IVPB  4 g IntraVENous ONCE   ??? vancomycin (VANCOCIN) 1,250 mg in 0.9% sodium chloride 250 mL IVPB  1,250 mg IntraVENous Q12H   ??? sodium chloride (NS) flush 10 mL  10 mL InterCATHeter DAILY   ??? sodium chloride (NS) flush 10-20 mL  10-20 mL InterCATHeter PRN   ??? heparin (porcine) pf 50 Units  50 Units InterCATHeter PRN   ??? heparin (porcine) pf 50 Units  50 Units InterCATHeter DAILY   ??? DULoxetine (CYMBALTA) capsule 20 mg  20 mg Oral DAILY   ??? magnesium oxide (MAG-OX) tablet 400 mg  400 mg Oral BID    ??? pantoprazole (PROTONIX) tablet 40 mg  40 mg Oral DAILY   ??? acyclovir (ZOVIRAX) tablet 400 mg  400 mg Oral BID   ??? cholecalciferol (VITAMIN D3) tablet 5,000 Units  5,000 Units Oral DAILY   ??? dextrose (D50) infusion 5-25 g  10-50 mL IntraVENous PRN   ??? glucagon (GLUCAGEN) injection 1 mg  1 mg IntraMUSCular PRN   ??? insulin glargine (LANTUS) injection 1-100 Units  1-100 Units SubCUTAneous QHS   ??? insulin lispro (HUMALOG) injection 1-100 Units  1-100 Units SubCUTAneous AC&HS   ??? insulin lispro (HUMALOG) injection 1-100 Units  1-100 Units SubCUTAneous PRN   ??? oxyCODONE IR (ROXICODONE) tablet 10 mg  10 mg Oral Q4H PRN   ??? potassium chloride (K-DUR, KLOR-CON) SR tablet 40 mEq  40 mEq Oral BID   ??? tacrolimus (PROGRAF) capsule 4 mg  4 mg Oral BID   ??? prochlorperazine (COMPAZINE) tablet 10 mg  10 mg Oral Q4H PRN   ??? zolpidem (AMBIEN) tablet 5 mg  5 mg Oral QHS PRN   ??? trimethoprim-sulfamethoxazole (BACTRIM DS, SEPTRA DS) 160-800 mg per tablet 1 Tab  1 Tab Oral Q MON, WED & FRI   ??? naloxone (NARCAN) injection 0.1 mg  0.1 mg IntraVENous PRN   ??? acetaminophen (TYLENOL) tablet 650 mg  650 mg Oral Q4H PRN    Or   ??? acetaminophen (TYLENOL) solution 650 mg  650 mg Oral Q4H PRN    Or   ??? acetaminophen (TYLENOL) suppository 650 mg  650 mg Rectal Q4H PRN   ??? nilotinib (TASIGNA) capsule cap 200 mg  (Patient Supplied)  200 mg Oral BID   ??? LORazepam (ATIVAN) tablet 1 mg  1 mg Oral ON CALL   ??? budesonide (ENTOCORT EC) capsule 6 mg  6 mg Oral TID   ??? *Pharmacy dosing vancomycin  1 Each Other Rx Dosing/Monitoring         Kristen Loader, MD

## 2017-12-29 NOTE — Progress Notes (Signed)
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 need TEE. Discussed with pt and Dr Nira Retort       If 8/19 bl cx neg after 2-3 days may replace picc -- pt says need iv access 2x/week;       -> reviewed repeat MRI - neg      defer w/u for diplopia to Neurology, appears holding on LP for now, pt says better this am     MICROBIOLOGY:   8/14 bctx x 2 MRSE   uctx ngtd  8/16 bctx x 2 S epi see prior  8/17     Cath tip > S epi  8/18 bctx x 2 ip- 1 of 2  G+ cocci    LINES AND CATHETERS:   PICC    Active Hospital Problems    Diagnosis Date Noted   ??? Bacteremia 12/26/2017   ??? TIA (transient ischemic attack) 12/25/2017         Subjective:     Interval notes reviewed. Pt says feels improved, no diplopia  this am. picc out tip cultured + S epi. Concerned about getting rx for GVHD at [facility].  Dr Nira Retort to discuss with her tx MD    Current Facility-Administered Medications   Medication Dose Route Frequency   ??? nystatin (MYCOSTATIN) 100,000 unit/mL oral suspension 500,000 Units   500,000 Units Oral QID   ??? vancomycin (VANCOCIN) 1,250 mg in 0.9% sodium chloride 250 mL IVPB  1,250 mg IntraVENous Q12H   ??? sodium chloride (NS) flush 10 mL  10 mL InterCATHeter DAILY   ??? sodium chloride (NS) flush 10-20 mL  10-20 mL InterCATHeter PRN   ??? heparin (porcine) pf 50 Units  50 Units InterCATHeter PRN   ??? heparin (porcine) pf 50 Units  50 Units InterCATHeter DAILY   ??? DULoxetine (CYMBALTA) capsule 20 mg  20 mg Oral DAILY   ??? magnesium oxide (MAG-OX) tablet 400 mg  400 mg Oral BID   ??? pantoprazole (PROTONIX) tablet 40 mg  40 mg Oral DAILY   ??? acyclovir (ZOVIRAX) tablet 400 mg  400 mg Oral BID   ??? cholecalciferol (VITAMIN D3) tablet 5,000 Units  5,000 Units Oral DAILY   ??? dextrose (D50) infusion  5-25 g  10-50 mL IntraVENous PRN   ??? glucagon (GLUCAGEN) injection 1 mg  1 mg IntraMUSCular PRN   ??? insulin glargine (LANTUS) injection 1-100 Units  1-100 Units SubCUTAneous QHS   ??? insulin lispro (HUMALOG) injection 1-100 Units  1-100 Units SubCUTAneous AC&HS   ??? insulin lispro (HUMALOG) injection 1-100 Units  1-100 Units SubCUTAneous PRN   ??? oxyCODONE IR (ROXICODONE) tablet 10 mg  10 mg Oral Q4H PRN   ??? potassium chloride (K-DUR, KLOR-CON) SR tablet 40 mEq  40 mEq Oral BID   ??? tacrolimus (PROGRAF) capsule 4 mg  4 mg Oral BID   ??? prochlorperazine (COMPAZINE) tablet 10 mg  10 mg Oral Q4H PRN   ??? zolpidem (AMBIEN) tablet 5 mg  5 mg Oral QHS PRN   ??? trimethoprim-sulfamethoxazole (BACTRIM DS, SEPTRA DS) 160-800 mg per tablet 1 Tab  1 Tab Oral Q MON, WED & FRI   ??? naloxone (NARCAN) injection 0.1 mg  0.1 mg IntraVENous PRN   ??? acetaminophen (TYLENOL) tablet 650 mg  650 mg Oral Q4H PRN    Or   ??? acetaminophen (TYLENOL) solution 650 mg  650 mg Oral Q4H PRN    Or   ??? acetaminophen (TYLENOL) suppository 650 mg  650 mg Rectal Q4H PRN   ??? nilotinib (TASIGNA) capsule cap 200 mg  (Patient Supplied)  200 mg Oral BID   ??? LORazepam (ATIVAN) tablet 1 mg  1 mg Oral ON CALL   ??? budesonide (ENTOCORT EC) capsule 6 mg  6 mg Oral TID    ??? *Pharmacy dosing vancomycin  1 Each Other Rx Dosing/Monitoring         Objective:     Visit Vitals  BP 137/87 (BP 1 Location: Right arm, BP Patient Position: Sitting)   Pulse 78   Temp 97.9 ??F (36.6 ??C)   Resp 14   Ht 5' (1.524 m)   Wt 67.5 kg (148 lb 13 oz)   SpO2 100%   BMI 22.63 kg/m??       Temp (24hrs), Avg:98.2 ??F (36.8 ??C), Min:97.9 ??F (36.6 ??C), Max:98.9 ??F (37.2 ??C)    GEN: WFWN BF appears stated age lying in bed NAD   HEENT: anicteric sclerae moist orophyarynx  NECK: Supple   CHEST: no rales rhonchi or wheeze  CVS: S1'S2' no rub murmur or gallop  ABD: soft non-tender (+) BS no masses non-distended  EXT: no edema LUE picc out     Labs: Results:   Chemistry Recent Labs     12/29/17  0440 12/28/17  1201   GLU 236*  --    NA 140  --    K 5.1  --    CL 107  --    CO2 28  --    BUN 10  --    CREA 0.8 0.8   CA 8.9  --    AGAP 5  --       CBC w/Diff Recent Labs     12/29/17  0440   WBC 4.1   RBC 2.77*   HGB 9.4*   HCT 28.2*   PLT 64*   GRANS 77.7*   LYMPH 12.6*        Mri brain 8/16 IMPRESSION:  1.   Nonspecific white matter hyperintensities likely due to chronic  microvascular ischemic changes. Iatrogenic drug toxicity, graft-versus-host,  inflammatory/infectious, hypertension, demyelinating process, trauma, chronic  headaches, sequelae of vasculitis are other considerations.  2. Low-lying cerebellar tonsils.  3. No orbital  masses. No infarction    8/17 mri IMPRESSION:  ??  1.  No abnormal enhancement to suggest active infectious/inflammatory/neoplastic  process    Echo 8/16 Interpretation Summary  A complete two-dimensional transthoracic echocardiogram was performed (2D, M-  mode, Doppler and color flow Doppler).  The study was technically adequate.    Small left ventricle, norla wall thickness with normal systolic function and   a  calculated ejectio fraction of 77%.  No regional wall motion abnormalites are noted.  Grade 1 diastolic dysfunction.  Normal size right ventricle, normal systolic function.   No hemodynamic valvular pathology or signficiant regurgitation.  Estimated right ventricular systolic pressure: [id number].  Negative bubble study for interatrial shunting.    Microbiology Results  No results for input(s): SDES, CULT in the last 72 hours.    Jearld Adjutant, MD  December 29, 2017  [facility]  [phone]

## 2017-12-29 NOTE — Other (Signed)
Bedside and Verbal shift change report given to [place]   (oncoming nurse) by Venancio Poisson, LPN (offgoing nurse). Report included the following information SBAR, Kardex, Recent Results and Cardiac Rhythm NSR.

## 2017-12-29 NOTE — Other (Signed)
Bedside shift change report given to [place] (oncoming nurse) by Linna Darner (offgoing nurse). Report included the following information SBAR, Kardex, ED Summary, Procedure Summary, Intake/Output, Recent Results, Med Rec Status and Cardiac Rhythm NSR.

## 2017-12-29 NOTE — Other (Signed)
Magnesium Sulfate Once, not infused during day shift due to patient having no IV site.    Patient refused Magnesium Sulfate IV  at this time,verbalized being so tired and just wanted to go to bed. Agreed to receive it in AM.

## 2017-12-29 NOTE — Progress Notes (Addendum)
PAGER ID: [phone]   MESSAGE: [id number]: [name] Wu. Pts IV infiltrated and needs a new one. She requests the PICC team. [employer] #[id number]      1715:   PAGER ID: [phone]   MESSAGE: [phone]: [name] Wu. Pt agrees to have an IV when you get a chance. Thanks, [employer] [phone]

## 2017-12-30 LAB — GLUCOSE, POC
Glucose (POC): 279 mg/dL — ABNORMAL HIGH (ref 65–105)
Glucose (POC): 204 mg/dL — ABNORMAL HIGH (ref 65–105)
Glucose (POC): 167 mg/dL — ABNORMAL HIGH (ref 65–105)
Glucose (POC): 140 mg/dL — ABNORMAL HIGH (ref 65–105)

## 2017-12-30 LAB — CULTURE, CATHETER TIP: Isolate: >15 — AB

## 2017-12-30 LAB — POCT GLUCOSE
POC Glucose: 279 mg/dL — ABNORMAL HIGH (ref 65–105)
POC Glucose: 204 mg/dL — ABNORMAL HIGH (ref 65–105)
POC Glucose: 167 mg/dL — ABNORMAL HIGH (ref 65–105)
POC Glucose: 140 mg/dL — ABNORMAL HIGH (ref 65–105)

## 2017-12-30 MED ORDER — LIDOCAINE 2 % MUCOSAL SOLN
2 % | Status: DC | PRN
Start: 2017-12-30 — End: 2017-12-31
  Administered 2017-12-30: 18:00:00 via OROMUCOSAL

## 2017-12-30 MED ORDER — FLUMAZENIL 0.1 MG/ML IV SOLN
0.1 mg/mL | Freq: Once | INTRAVENOUS | Status: AC
Start: 2017-12-30 — End: 2017-12-31
  Administered 2017-12-30: 18:00:00 via INTRAVENOUS

## 2017-12-30 MED ORDER — FENTANYL CITRATE (PF) 50 MCG/ML IJ SOLN
50 mcg/mL | INTRAMUSCULAR | Status: DC | PRN
Start: 2017-12-30 — End: 2018-01-02
  Administered 2017-12-30 (×2): via INTRAVENOUS

## 2017-12-30 MED ORDER — MIDAZOLAM 1 MG/ML IJ SOLN
1 mg/mL | INTRAMUSCULAR | Status: DC | PRN
Start: 2017-12-30 — End: 2018-01-02
  Administered 2017-12-30 (×2): via INTRAVENOUS

## 2017-12-30 MED ORDER — NALOXONE 0.4 MG/ML INJECTION
0.4 mg/mL | Freq: Once | INTRAMUSCULAR | Status: AC
Start: 2017-12-30 — End: 2017-12-31
  Administered 2017-12-30: 18:00:00 via INTRAVENOUS

## 2017-12-30 MED ORDER — PHARMACY VANCOMYCIN NOTE
Freq: Once | Status: DC
Start: 2017-12-30 — End: 2017-12-31

## 2017-12-30 MED ORDER — SODIUM CHLORIDE 0.9 % IV
INTRAVENOUS | Status: DC
Start: 2017-12-30 — End: 2018-01-02

## 2017-12-30 MED FILL — MAGNESIUM OXIDE 400 MG TAB: 400 mg | ORAL | Qty: 1

## 2017-12-30 MED FILL — FENTANYL CITRATE (PF) 50 MCG/ML IJ SOLN: 50 mcg/mL | INTRAMUSCULAR | Qty: 2

## 2017-12-30 MED FILL — MAGNESIUM SULFATE 4 GRAM/100 ML IV PIGGY BACK: 4 gram/100 mL ( %) | INTRAVENOUS | Qty: 100

## 2017-12-30 MED FILL — NYSTATIN 100,000 UNIT/ML ORAL SUSP: 100000 unit/mL | ORAL | Qty: 5

## 2017-12-30 MED FILL — MIDAZOLAM 1 MG/ML IJ SOLN: 1 mg/mL | INTRAMUSCULAR | Qty: 2

## 2017-12-30 MED FILL — CHOLECALCIFEROL (VITAMIN D3) 1,000 UNIT (25 MCG) TAB: ORAL | Qty: 5

## 2017-12-30 MED FILL — ACYCLOVIR 800 MG TAB: 800 mg | ORAL | Qty: 1

## 2017-12-30 MED FILL — LIDOCAINE VISCOUS 2 % MUCOSAL SOLUTION: 2 % | Qty: 15

## 2017-12-30 MED FILL — DULOXETINE 20 MG CAP, DELAYED RELEASE: 20 mg | ORAL | Qty: 1

## 2017-12-30 MED FILL — ZOLPIDEM 5 MG TAB: 5 mg | ORAL | Qty: 1

## 2017-12-30 MED FILL — SODIUM CHLORIDE 0.9 % IV: INTRAVENOUS | Qty: 1000

## 2017-12-30 MED FILL — BUDESONIDE SR 3 MG 24 HR CAP: 3 mg | ORAL | Qty: 2

## 2017-12-30 MED FILL — FLUMAZENIL 0.1 MG/ML IV SOLN: 0.1 mg/mL | INTRAVENOUS | Qty: 10

## 2017-12-30 MED FILL — PHARMACY VANCOMYCIN NOTE: Qty: 1

## 2017-12-30 MED FILL — POTASSIUM CHLORIDE SR 20 MEQ TAB, PARTICLES/CRYSTALS: 20 mEq | ORAL | Qty: 2

## 2017-12-30 MED FILL — TACROLIMUS 1 MG CAP: 1 mg | ORAL | Qty: 4

## 2017-12-30 MED FILL — PANTOPRAZOLE 40 MG TAB, DELAYED RELEASE: 40 mg | ORAL | Qty: 1

## 2017-12-30 MED FILL — VANCOMYCIN 10 GRAM IV SOLR: 10 gram | INTRAVENOUS | Qty: 1250

## 2017-12-30 MED FILL — NALOXONE 0.4 MG/ML INJECTION: 0.4 mg/mL | INTRAMUSCULAR | Qty: 1

## 2017-12-30 NOTE — Progress Notes (Signed)
Discharge plan to home, no need identified at this time.

## 2017-12-30 NOTE — Progress Notes (Signed)
Paged PICC:  Pt: [name] Wu  Rm: [phone]  Need new PIV for IV Vanco. Pt has known poor vascular status.   Thank you,  Amber Smith,RN  ext [phone]

## 2017-12-30 NOTE — Progress Notes (Signed)
TEE completed

## 2017-12-30 NOTE — Progress Notes (Signed)
Bedside and Verbal shift change report given to [employer]   (oncoming nurse) by Henriette Combs (offgoing nurse). Report included the following information SBAR, Kardex, Intake/Output, MAR, Recent Results and Quality Measures.

## 2017-12-30 NOTE — Progress Notes (Signed)
Progress  Notes by Kristen Loader, MD at 12/30/17 1705                Author: Kristen Loader, MD  Service: Hospitalist  Author Type: Physician       Filed: 12/31/17 0736  Date of Service: 12/30/17 1705  Status: Signed          Editor: Kristen Loader, MD (Physician)                                    Hospitalist Progress Note   Kristen Loader, MD   [facility]      Daily Progress Note: 12/30/2017        Assessment/Plan:        Possible TIA   Pt has multiple positive BCx with Staph epi.   PICC tip was positive for S epi.   Pt with TIA and faint murmur on exam.   Possiblility of septic emboli   TEE 8/20 was negative for vegetations. BE ruled out      Staph epidermidis Sepsis   Immunocompromised pt   TEE 8/20 was negative for vegetations   ID is following   Pt is on Bactrim and Vancomycin      Diplopia with transient slurred speech, LUE weakness, gait disturbances.    Possible TIA   Work-up as above   Sx have resolved.   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   I spoke with her lead BMT oncologist and he noted to continue treatment here for infxn and once stable for discharge to d/c home so she can go to [place] on her own.   Pt is on Tasigna 261m BID      Esophagitis and diarrhea d/t GVH dz    Seems to have improved.    Pt is on Compazine PRN      Oral thrush.   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   Replete and recheck   Pt noted she gets routine IV Mag   Continue PO magOx.      Chronic pain   APAP and Oxycodone PRN      DVT Prophylaxis.   SCDs   No heparin due  to plt <100      Code status. Full code.      Disposition.   S/p TEE 8/20 which was neg for vegetations   Continue abx per ID   When medically stable, will plan to d/c home and have [place]     ------------------      36 minutes were spent on the patient of which more than 50% was spent in coordination of care and counseling (time spent with patient/family face to face, physical exam, reviewing laboratory and imaging investigations, speaking with physicians and nursing  staff involved in this patient's care)      ------------------  Physical exam:   General:   Alert, NAD, pleasant.  Cooperative   HEENT: Sclera anicteric, PERRL, OM moist, throat clear. Oral thrush   Chest:   CTA B, no wheeze, no rales, no rhonchi.  Good air movement.   CV: RRR, S1/S2 were normal, no murmur   Abdomen: NTND, soft, NABS, no masses were noted.  No rebound, no guarding.   Extremities: No  edema.        Subjective:     Pt noted that she feels okay.  We discussed the plans for a TEE this morning.  No Cp, no f/C, no abd pain        Objective:        Visit Vitals      BP  123/64 (BP Patient Position: Supine)     Pulse  67     Temp  97.9 ??F (36.6 ??C)     Resp  17     Ht  5' (1.524 m)     Wt  66 kg (145 lb 8.1 oz)     SpO2  100%        BMI  22.12 kg/m??         O2 Device: Room air      Temp (24hrs), Avg:98.3 ??F (36.8 ??C), Min:97.3 ??F (36.3 ??C), Max:98.9 ??F (37.2 ??C)     No intake/output data recorded.    08/18 1901 - 08/20 0700   In: 890 [P.O.:540; I.V.:350]   Out: -            Data Review:          Recent Days:     Recent Labs           12/29/17   0440     WBC  4.1     HGB  9.4*     HCT  28.2*        PLT  64*          Recent Labs            12/29/17   0440  12/28/17   1201     NA  140   --      K  5.1   --      CL  107   --      CO2  28   --      GLU  236*   --      BUN  10   --      CREA  0.8  0.8     CA  8.9   --          MG  1.4*   --         No results for input(s): PH, PCO2, PO2, HCO3, FIO2 in the last  72 hours.      24 Hour Results:     Recent Results (from the past 24 hour(s))     GLUCOSE, POC          Collection Time: 12/29/17  5:53 PM         Result  Value  Ref Range            Glucose (POC)  138 (H)  65 - 105 mg/dL       GLUCOSE, POC          Collection Time: 12/29/17  9:28 PM         Result  Value  Ref Range  Glucose (POC)  279 (H)  65 - 105 mg/dL       GLUCOSE, POC          Collection Time: 12/30/17  8:26 AM         Result  Value  Ref Range            Glucose (POC)  204 (H)  65 - 105 mg/dL       GLUCOSE, POC          Collection Time: 12/30/17 11:03 AM         Result  Value  Ref Range            Glucose (POC)  167 (H)  65 - 105 mg/dL       GLUCOSE, POC          Collection Time: 12/30/17  3:56 PM         Result  Value  Ref Range            Glucose (POC)  140 (H)  65 - 105 mg/dL           Xr Chest Pa Lat      Result Date: 12/24/2017   Chest Indication: hx of GVHD with N/V/D.    Comparison: 05/20/2017 Findings: Frontal and lateral views of the chest demonstrate that the cardiac silhouette is not enlarged.  There is no focal infiltrate, pleural effusion, vascular congestion, or pneumothorax.   Left PICC with tip overlying the distal superior vena cava.       Impression: No acute cardiopulmonary disease.       Mri Brain Wo Cont      Result Date: 12/26/2017   EXAMINATION: MRI BRAIN WO CONT CLINICAL INDICATION: Diplopia; TIA bone marrow transplant. Blurred vision COMPARISON:  None. TECHNIQUE: T1 and T2-weighted sequences of the brain were obtained in multiple planes without contrast. FINDINGS: No restricted  diffusion. No masses, no midline shift. No hydrocephalus. No extra-axial fluid collections. Visualized portions of the orbits grossly unremarkable. Mastoid air cells well aerated. Visualized portions of paranasal sinuses unremarkable. Intracranial flow  voids of great vessels, grossly intact. Diffuse low signal marrow. Age-appropriate atrophy. No susceptibility artifact on gradient echo imaging to suggest  hemorrhage. Cerebellar tonsil extends into the foramen magnum 2 mm or so. Pituitary gland normal.  Orbits appear normal. No compression of optic chiasm extraocular muscles are normal in size. No orbital masses. Several foci of T2 prolongation in corona radiata and centrum semiovale. Largest in the right frontal lobe measures 8 mm and in the left parietal  lobe millimeters 10 mm. Several additional smaller lesions are noted in the frontal and parietal lobes.       IMPRESSION: 1.   Nonspecific white matter hyperintensities likely due to chronic microvascular ischemic changes. Iatrogenic drug toxicity, graft-versus-host, inflammatory/infectious, hypertension, demyelinating process, trauma, chronic headaches, sequelae  of vasculitis are other considerations. 2. Low-lying cerebellar tonsils. 3. No orbital masses. No infarction.       Mri Brain W Cont      Result Date: 12/27/2017   MRI OF THE BRAIN INDICATION: diplopia.    COMPARISON: Noncontrast MRI 12/26/2017 TECHNIQUE: Multiplanar, multisequence MRI of the brain was performed without intravenous contrast.  FINDINGS: The patient returned for contrast-enhanced imaging of the brain.  Images are degraded by patient motion artifacts. However, there is no abnormal parenchymal, dural, or meningeal enhancement. No gross sellar mass.       IMPRESSION: 1.  No abnormal enhancement to suggest active infectious/inflammatory/neoplastic process  Ct Head Wo Cont      Result Date: 12/25/2017   DICOM format image data is available to non-affiliated external healthcare facilities or entities on a secure, media free, reciprocally searchable basis with patient authorization for 12 months following the date of the study. Clinical history: Diplopia  EXAMINATION: Noncontrast CT scan of the head 12/24/2017. 4 mm axial scanning is performed from the skull base to the vertex. Coronal and sagittal reconstruction imaging has been obtained. Correlation: None FINDINGS: Visualized paranasal sinuses  and mastoid  air cells are clear. No acute intracranial hemorrhage, mass or infarction.       IMPRESSION: Normal unenhanced CT scan of the head. Initial interpretation: [facility]       Cta Head      Result Date: 12/26/2017   CTA OF THE HEAD INDICATION: Nausea, sweating, vomiting, diarrhea COMPARISON: MRI 12/26/2017, CT 12/24/2017 TECHNIQUE: Noncontrast CT the head performed followed by CTA of the head with intravenous contrast. Multiplanar two-dimensional and three-dimensional  maximum intensity projection reformats performed and reviewed. DICOM format image data is available to non-affiliated external healthcare facilities or entities on a secure, media free, reciprocally searchable basis with patient authorization for 12 months  following the date of the study. FINDINGS: Noncontrast CT of the head demonstrates no acute segmental infarct, intracranial hemorrhage, mass, midline shift, extra-axial fluid collection, or hydrocephalus. The vertebral arteries are codominant. There is  no focal hemodynamically significant stenosis within the anterior or posterior circulation. The anterior and posterior communicating arteries appear patent. There is no definitive aneurysm.       IMPRESSION: No focal hemodynamically significant stenosis.       Cta Neck      Result Date: 12/26/2017   CTA OF THE NECK WITH CONTRAST INDICATION: Speech difficulty, and balance COMPARISON: No relevant studies. TECHNIQUE: CTA of the neck was performed with nonionic intravenous contrast with multiplanar 2-dimensional and maximum intensity projection three-dimensional  images. Stenosis was evaluated using the NASCET criteria. DICOM format image data is available to non-affiliated external healthcare facilities or entities on a secure, media free, reciprocally searchable basis with patient authorization for 12 months  following the date of the study. FINDINGS: There is no hemodynamically significant stenosis within the carotid or vertebral  arteries. There is no evidence of dissection. CAROTID STENOSIS REFERENCE USING NASCET CRITERIA % Stenosis = (1 - narrowest diameter/diameter  of distal artery) x100 Mild: < 50% stenosis Moderate: 50-69% stenosis. Severe: 70-94% stenosis. Near occlusion: 95-99% stenosis. Occluded: 100% stenosis       IMPRESSION: No focal hemodynamically significant stenosis.          Microbiology:     All Micro Results               Procedure  Component  Value  Units  Date/Time           CULTURE, BLOOD [[id number]]              Order Status:  Sent  Specimen:  Blood             CULTURE, BLOOD [[id number]]              Order Status:  Sent  Specimen:  Blood             CULTURE, CATHETER TIP [[id number]]  (Abnormal)  (Susceptibility)  Collected:  12/27/17 1550            Order Status:  Completed  Specimen:  Catheter Tip from PICC  Updated:  12/30/17  1106                Isolate  --                    >15 CFU   Staphylococcus epidermidis                CULTURE, BLOOD [[id number]]  (Abnormal)  Collected:  12/28/17 0249            Order Status:  Completed  Specimen:  Blood  Updated:  12/30/17 0743                Blood Culture Result  --                  Gram stain Gram Positive Cocci In Clusters   Called to/read back DEREK BOZMAN 6EST   on 12/29/2017   at 0600   by VGP                  Isolate                 Coagulase Negative Staphylococcus                       CULTURE, BLOOD [[id number]]  Collected:  12/28/17 0249            Order Status:  Completed  Specimen:  Blood  Updated:  12/30/17 0716                Blood Culture Result  No Growth At 48 Hours                CULTURE, BLOOD [[id number]]  Collected:  12/29/17 1830            Order Status:  Canceled  Specimen:  Blood             CULTURE, BLOOD [[id number]]  Collected:  12/29/17 1830            Order Status:  Canceled  Specimen:  Blood             CULTURE, BLOOD [[id number]]  (Abnormal)  Collected:  12/26/17 1240            Order Status:  Completed  Specimen:  Blood from [facility] Pic[facility]  Updated:   12/28/17 0915               Blood Culture Result                 Gram stain Gram Positive Cocci In Clusters                          Isolate  --                    Staphylococcus epidermidis   See Previous Culture For Susceptibility Report                CULTURE, BLOOD [[id number]]  (Abnormal)  Collected:  12/26/17 1246            Order Status:  Completed  Specimen:  Blood from PICC  Updated:  12/28/17 0913                Blood Culture Result  --                  Gram stain Gram Positive Cocci In Clusters   Called to/read back  sissy   on 12/27/17   at [place]   by [id number]                   Isolate  --                    Staphylococcus epidermidis   See Previous Culture For Susceptibility Report                CULTURE, BLOOD [[id number]]  (Abnormal)  Collected:  12/24/17 2255            Order Status:  Completed  Specimen:  Blood from [facility] Pic[facility]  Updated:  12/27/17 0910                Blood Culture Result  --                  Gram stain Gram Positive Cocci In Clusters   Called to/read back jannet   on 12/25/17   at 1751   by [id number]                   Isolate  --                    See Previous Culture For Susceptibility Report   Staphylococcus epidermidis                CULTURE, BLOOD [[id number]]  (Abnormal)  (Susceptibility)  Collected:  12/24/17 2230            Order Status:  Completed  Specimen:  Blood from [facility] Picc  Updated:  12/27/17 0749                Blood Culture Result  --                  Gram stain Gram Positive Cocci In Clusters   Called to/read back jannet   on 12/25/17   at 1751   by [id number]                  Isolate                 Staphylococcus epidermidis                           CULTURE, BLOOD FOR MRSA/SA BY PCR [[id number]]  Collected:  12/24/17 0951            Order Status:  Completed  Specimen:  Blood  Updated:  12/26/17 1133                Blood culture, MRSA by PCR  NEGATIVE              Blood culture, S. aureus by PCR  NEGATIVE              Positive culture accn number  [id number]                   Comment:  This PCR testing was performed from the blood culture accession number that is noted. Refer to   this accession number for antibiotic susceptibility testing and final identification.                        CULTURE, URINE [[id number]]  (Abnormal)  Collected:  12/24/17 2255            Order Status:  Completed  Specimen:  Urine from Clean catch  Updated:  12/26/17 1021                Culture result  --                    30,000 CFU/mL   Mixed Flora, three or more different organisms present   No Further Workup                      Cardiology:     Results for orders placed or performed during the hospital encounter of 12/24/17     EKG, 12 LEAD, INITIAL         Result  Value  Ref Range            Ventricular Rate  104  BPM       Atrial Rate  104  BPM       P-R Interval  148  ms       QRS Duration  66  ms       Q-T Interval  328  ms       QTC Calculation (Bezet)  431  ms       Calculated P Axis  18  degrees       Calculated R Axis  17  degrees       Calculated T Axis  16  degrees       Diagnosis                 Sinus tachycardia   Otherwise normal ECG   No previous ECGs available   Confirmed by Marrion Coy, M.D., Ellin Saba. (30) on 12/25/2017 7:41:35 AM                 Problem List:      Problem List  as of 12/30/2017  Date Reviewed:  01-09-18                        Codes  Class  Noted - Resolved             Bacteremia  ICD-10-CM: R78.81   ICD-9-CM: 790.7    12/26/2017 - Present                       TIA (transient ischemic attack)  ICD-10-CM: G45.9   ICD-9-CM: 435.9    12/25/2017 - Present                       Thrombocytopenia ([place])  ICD-10-CM: D69.6   ICD-9-CM: 287.5    05/13/2017 - Present                       Neutropenic fever (HCC)  ICD-10-CM: D70.9, R50.81   ICD-9-CM: 288.00, 780.61    05/13/2017 - Present                       Chest pressure  ICD-10-CM: R07.89   ICD-9-CM: 786.59    04/25/2017 - Present                       Elevated troponin  ICD-10-CM: R74.8   ICD-9-CM: 790.6    04/25/2017 - Present                           Medications reviewed  Current Facility-Administered Medications          Medication  Dose  Route  Frequency           ?  0.9% sodium chloride infusion   83 mL/hr  IntraVENous  CONTINUOUS     ?  fentaNYL citrate (PF) injection 12.5-50 mcg   12.5-50 mcg  IntraVENous  PRN     ?  lidocaine (XYLOCAINE) 2 % viscous solution 15 mL   15 mL  Mouth/Throat  PRN     ?  midazolam (VERSED) injection 0.25-2 mg   0.25-2 mg  IntraVENous  PRN     ?  naloxone (NARCAN) injection 0.4 mg   0.4 mg  IntraVENous  RAD ONCE     ?  flumazenil (ROMAZICON) 0.1 mg/mL injection 0.5 mg   0.5 mg  IntraVENous  RAD ONCE     ?  [START ON 12/31/2017] vancomycin trough draw on 8/21 at 1000     Other  ONCE     ?  nystatin (MYCOSTATIN) 100,000 unit/mL oral suspension 500,000 Units   500,000 Units  Oral  QID     ?  vancomycin (VANCOCIN) 1,250 mg in 0.9% sodium chloride 250 mL IVPB   1,250 mg  IntraVENous  Q12H     ?  sodium chloride (NS) flush 10 mL   10 mL  InterCATHeter  DAILY     ?  sodium chloride (NS) flush 10-20 mL   10-20 mL  InterCATHeter  PRN           ?  heparin (porcine) pf 50 Units   50 Units  InterCATHeter  PRN           ?  heparin (porcine) pf 50 Units   50 Units  InterCATHeter  DAILY     ?  DULoxetine (CYMBALTA) capsule 20 mg   20 mg  Oral  DAILY     ?  magnesium oxide (MAG-OX) tablet 400 mg   400 mg  Oral  BID     ?  pantoprazole (PROTONIX) tablet 40 mg   40 mg  Oral  DAILY     ?  acyclovir (ZOVIRAX) tablet 400 mg   400 mg  Oral  BID     ?  cholecalciferol (VITAMIN D3) tablet 5,000 Units   5,000 Units  Oral  DAILY     ?  dextrose (D50) infusion 5-25 g   10-50 mL  IntraVENous  PRN     ?  glucagon (GLUCAGEN) injection 1 mg   1 mg  IntraMUSCular  PRN     ?  insulin glargine (LANTUS) injection 1-100 Units   1-100 Units  SubCUTAneous  QHS     ?  insulin lispro (HUMALOG) injection 1-100 Units   1-100 Units  SubCUTAneous  AC&HS     ?  insulin lispro (HUMALOG) injection 1-100 Units   1-100 Units  SubCUTAneous  PRN     ?  oxyCODONE IR  (ROXICODONE) tablet 10 mg   10 mg  Oral  Q4H PRN     ?  potassium chloride (K-DUR, KLOR-CON) SR tablet 40 mEq   40 mEq  Oral  BID     ?  tacrolimus (PROGRAF) capsule 4 mg   4 mg  Oral  BID     ?  prochlorperazine (COMPAZINE) tablet 10 mg   10 mg  Oral  Q4H PRN     ?  zolpidem (AMBIEN) tablet 5 mg   5 mg  Oral  QHS PRN     ?  trimethoprim-sulfamethoxazole (BACTRIM DS, SEPTRA DS) 160-800 mg per tablet 1 Tab   1 Tab  Oral  Q MON, WED & FRI     ?  naloxone (NARCAN) injection 0.1 mg   0.1 mg  IntraVENous  PRN           ?  acetaminophen (TYLENOL) tablet 650 mg   650 mg  Oral  Q4H PRN          Or           ?  acetaminophen (TYLENOL) solution 650 mg   650 mg  Oral  Q4H PRN          Or           ?  acetaminophen (TYLENOL) suppository 650 mg   650 mg  Rectal  Q4H PRN     ?  nilotinib (TASIGNA) capsule cap 200 mg  (Patient Supplied)   200 mg  Oral  BID     ?  LORazepam (ATIVAN) tablet 1 mg   1 mg  Oral  ON CALL     ?  budesonide (ENTOCORT EC) capsule 6 mg   6 mg  Oral  TID           ?  *Pharmacy dosing vancomycin   1 Each  Other  Rx Dosing/Monitoring              Kristen Loader, MD

## 2017-12-30 NOTE — Progress Notes (Signed)
Progress  Notes by Truman Hayward, MD at 12/30/17 1639                Author: Truman Hayward, MD  Service: Infectious Disease  Author Type: Physician       Filed: 12/30/17 1641  Date of Service: 12/30/17 1639  Status: Signed          Editor: Truman Hayward, MD (Physician)                            Infectious Disease Follow-up       Admit Date: 12/24/2017      Today's Date: 12/30/17         Current abx  Prior abx        Prophylactic Bactrim tiw, ACV; vancomycin  8/16-4  meropenem 8/16-1          Assessment:          Pos bctx's GPC cl 2 of 2 8/14-MRSE, suspect early picc infection    -repeat bctx's 8/16 2 of 2 GPC cl incl from picc    -TTE no reported vegetation, bubble study negative      AML sp chemo/stem cell xplant   -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         If 8/20 bl cx neg after 2-3 days may replace picc -- pt says need iv access 2x/week;        -> reviewed repeat MRI - neg       defer w/u for diplopia to Neurology, appears holding on LP for now, pt says better this am         MICROBIOLOGY:     8/14 bctx x 2 MRSE    uctx ngtd   8/16 bctx x 2 S epi see prior   8/17     Cath tip > S epi   8/18 bctx x 2 ip- 1 of 2  G+ cocci        LINES AND CATHETERS:     PICC        Active Hospital Problems           Diagnosis  Date Noted         ?  Bacteremia  12/26/2017         ?  TIA (transient ischemic attack)   12/25/2017                Subjective:        Interval notes reviewed. Pt says feeling better, had TEE today which is neg for any endocarditis,  since repeat blcx from 8/18 still positive, will repeat blcx today         Current Facility-Administered Medications          Medication  Dose  Route  Frequency           ?  0.9% sodium chloride infusion   83 mL/hr  IntraVENous  CONTINUOUS     ?  fentaNYL citrate (PF) injection 12.5-50 mcg   12.5-50 mcg  IntraVENous  PRN     ?  lidocaine (XYLOCAINE) 2 % viscous solution 15 mL   15 mL  Mouth/Throat  PRN     ?  midazolam (VERSED) injection 0.25-2 mg   0.25-2 mg  IntraVENous  PRN     ?  naloxone (NARCAN) injection 0.4 mg   0.4 mg  IntraVENous  RAD ONCE     ?  flumazenil (ROMAZICON) 0.1 mg/mL injection 0.5 mg   0.5 mg  IntraVENous  RAD ONCE     ?  [START ON 12/31/2017] vancomycin trough draw on 8/21 at 1000     Other  ONCE     ?  nystatin (MYCOSTATIN) 100,000 unit/mL oral suspension 500,000 Units   500,000 Units  Oral  QID     ?  vancomycin (VANCOCIN) 1,250 mg in 0.9% sodium chloride 250 mL IVPB   1,250 mg  IntraVENous  Q12H     ?  sodium chloride (NS) flush 10 mL   10 mL  InterCATHeter  DAILY     ?  sodium chloride (NS) flush 10-20 mL   10-20 mL  InterCATHeter  PRN     ?  heparin (porcine) pf 50 Units   50 Units  InterCATHeter  PRN           ?  heparin (porcine) pf 50 Units   50 Units  InterCATHeter  DAILY           ?  DULoxetine (CYMBALTA) capsule 20 mg   20 mg  Oral  DAILY     ?  magnesium oxide (MAG-OX) tablet 400 mg   400 mg  Oral  BID     ?  pantoprazole (PROTONIX) tablet 40 mg   40 mg  Oral  DAILY     ?  acyclovir (ZOVIRAX) tablet 400 mg   400 mg  Oral  BID     ?  cholecalciferol (VITAMIN D3) tablet 5,000 Units   5,000 Units  Oral  DAILY     ?  dextrose (D50) infusion 5-25 g   10-50 mL  IntraVENous  PRN     ?  glucagon (GLUCAGEN) injection 1 mg   1 mg  IntraMUSCular  PRN     ?  insulin glargine (LANTUS) injection 1-100 Units   1-100 Units  SubCUTAneous  QHS     ?  insulin  lispro (HUMALOG) injection 1-100 Units   1-100 Units  SubCUTAneous  AC&HS     ?  insulin lispro (HUMALOG) injection 1-100 Units   1-100 Units  SubCUTAneous  PRN     ?  oxyCODONE IR (ROXICODONE) tablet 10 mg   10 mg  Oral  Q4H PRN     ?  potassium chloride (K-DUR, KLOR-CON) SR tablet 40 mEq   40 mEq  Oral  BID     ?  tacrolimus (PROGRAF) capsule 4 mg   4 mg  Oral  BID     ?  prochlorperazine (COMPAZINE) tablet 10 mg   10 mg  Oral  Q4H PRN     ?  zolpidem (AMBIEN) tablet 5 mg   5 mg  Oral  QHS PRN     ?  trimethoprim-sulfamethoxazole (BACTRIM DS, SEPTRA DS) 160-800 mg per tablet 1 Tab   1 Tab  Oral  Q MON, WED & FRI     ?  naloxone (NARCAN) injection 0.1 mg   0.1 mg  IntraVENous  PRN     ?  acetaminophen (TYLENOL) tablet 650 mg   650 mg  Oral  Q4H PRN          Or           ?  acetaminophen (TYLENOL) solution 650 mg   650 mg  Oral  Q4H PRN          Or           ?  acetaminophen (TYLENOL) suppository 650 mg   650 mg  Rectal  Q4H PRN     ?  nilotinib (TASIGNA) capsule cap 200 mg  (Patient Supplied)   200 mg  Oral  BID     ?  LORazepam (ATIVAN) tablet 1 mg   1 mg  Oral  ON CALL     ?  budesonide (ENTOCORT EC) capsule 6 mg   6 mg  Oral  TID           ?  *Pharmacy dosing vancomycin   1 Each  Other  Rx Dosing/Monitoring                Objective:        Visit Vitals      BP  123/64 (BP Patient Position: Supine)     Pulse  67     Temp  97.9 ??F (36.6 ??C)     Resp  17     Ht  5' (1.524 m)     Wt  66 kg (145 lb 8.1 oz)     SpO2  100%        BMI  22.12 kg/m??           Temp (24hrs), Avg:98.3 ??F (36.8 ??C), Min:97.3 ??F (36.3 ??C), Max:98.9 ??F (37.2 ??C)      GEN: WFWN BF appears stated age lying in bed NAD    HEENT: anicteric sclerae moist orophyarynx   NECK: Supple    CHEST: no rales rhonchi or wheeze   CVS: S1'S2' no rub murmur or gallop   ABD: soft non-tender (+) BS no masses non-distended   EXT: no edema LUE picc out          Labs:  Results:        Chemistry  Recent Labs         12/29/17   0440  12/28/17   1201      GLU  236*   --        NA  140   --       K  5.1   --       CL  107   --       CO2  28   --       BUN  10   --       CREA  0.8  0.8      CA  8.9   --       AGAP  5   --                  CBC  w/Diff  Recent Labs         12/29/17   0440      WBC  4.1      RBC  2.77*      HGB  9.4*      HCT  28.2*      PLT  64*      GRANS  77.7*      LYMPH  12.6*                 Mri brain 8/16 IMPRESSION:   1.   Nonspecific white matter hyperintensities likely due to chronic   microvascular ischemic changes. 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   2. Low-lying cerebellar tonsils.   3. No orbital masses. No infarction      8/17 mri IMPRESSION:   ??   1.  No abnormal enhancement to suggest active infectious/inflammatory/neoplastic   process      Echo 8/16 Interpretation Summary  A complete two-dimensional transthoracic echocardiogram was performed (2D, M-  mode, Doppler and color flow Doppler).  The study was technically adequate.    Small left ventricle, norla wall thickness  with normal systolic function and   a  calculated ejectio fraction of 77%.  No regional wall motion abnormalites are noted.  Grade 1 diastolic dysfunction.  Normal size right ventricle, normal systolic function.  No hemodynamic  valvular pathology or signficiant regurgitation.  Estimated right ventricular systolic pressure: 32GMWN.  Negative bubble study for interatrial shunting.      Microbiology Results   No results for input(s): SDES, CULT in the last 72 hours.      Truman Hayward, MD   December 30, 2017   [facility]   [phone]

## 2017-12-30 NOTE — Op Note (Signed)
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 Informed consent was obtained and timeout was performed with all team members presents. Throat was sprayed with lidocaine spray. Pt received a total of 2mg  Versed and 50mg  Fentanyl. TEE probe was passed on first attempt without any difficulty.     Preliminary TEE Findings:  - Normal LV systolic function  - No valvular vegetations  - No significant valvular stenosis or regurgitation  - No LAA thrombus  - Bubble study was negative    Pt tolerated procedure well, no complications.   Final TEE report to follow.    Findings discussed with Dr. Nira Retort.    Maurice Small, MD  [facility]

## 2017-12-30 NOTE — Consults (Signed)
Consults by Raj Janus, PA-C at 12/30/17 0915                Author: Raj Janus, PA-C  Service: Cardiology  Author Type: Physician Assistant       Filed: 12/30/17 0922  Date of Service: 12/30/17 0915  Status: Attested           Editor: Leia Alf (Physician Assistant)  Cosigner: Maurice Small, MD at 12/30/17 1351          Attestation signed by Maurice Small, MD at 12/30/17 [name]      Patient independently seen and examined. Relevant laboratory, radiology, EKG, and echocardiographic data reviewed; agree with findings and plan in attached PA note. Changes to clinical decision making and plan are detailed below.      Physical exam:    Pleasant, in NAD   RRR, no m/r/g appreciated   CTAB, no crackles   No LE edema, 2+ distal pulses b/l      In brief, 57 year old female with history of AML s/p chemo and stem cell transplant, found to be bacteremic with BCx positive for staph epidermidis. Pt was originally admitted with possible TIA symptoms, which have resolved. MRI brain was negative for  an acute process.  We have been consulted for consideration of TEE to evaluate for infective endocarditis. Pt says she feels ok, denies any CP or SOB. TTE images from 12/26/17 personally reviewed - normal LV systolic function with EF of 70%, no obvious  vegetations seen on valves, no significant stenosis or regurgitation.      Indications and risks of TEE and moderate sedation discussed with pt at bedside and she has elected to proceed. Further recs to follow after TEE today. Rest as detailed below.         Maurice Small, MD   [facility]   Pager [phone]   12/30/2017                                            [facility] Consultation Note         Referring Physician: Dr. Nira Retort    Outpatient Cardiologist: None    PCP: Other, Phys, MD         Impression:     1.  Bacteremia    ??  Blood cultures +staph epidermidis    ??  Antibiotics per  ID, currently on bactrim and vancomycin    2.  AML ([place] chromosome positive) s/p chemotherapy/ stem cell transplant   ??  Follows in [place] for treatment, locally follows with Dr. Edd Arbour    3.  Numbness, weakness; resolved    ??  MRI brain: no acute changes   ??  Possible TIA   ??  Neurology following    4.  Diabetes mellitus type 2, uncontrolled   ??  A1c: 7.0    5.  Hypertension   6.  Pancytopenia   7.  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  Oral thrush    9.  Chronic pain   10.  H/o prior meningeal leukemia    11.  Immunocompromised         Recommendations:     1.  Plan for TEE today   2.  Keep patient  NPO    3.  Further recommendations pending results of TEE       Final recommendations per cardiologist.        Chief Complaint       Patient presents with        ?  Nausea     ?  Double Vision        ?  Diarrhea        Admission diagnosis: <principal problem not specified>     Patient Active Problem List        Diagnosis  Code         ?  Chest pressure  R07.89     ?  Elevated troponin  R74.8     ?  Thrombocytopenia ([place])  D69.6     ?  Neutropenic fever ([place])  D70.9, R50.81     ?  TIA (transient ischemic attack)  G45.9         ?  Bacteremia  R78.81           HPI:    [name] Wu is a 57 y.o.  female who is being seen on consult for possible TEE. She has a past medical history significant for AML s/p chemotherapy and stem cell transplant, pancytopenia, PICC in situ, and diabetes mellitus.  She presented to [facility] ED on 8/14 due to diplopia, nausea/vomiting, and weakness/numbness of the left side. She reported a blood sugar in the 70s but drinking orange juice did not resolve symptoms. She was admitted for possible TIA with resolution of symptoms;  MRI brain was negative for any acute changes. Blood cultures positive and ID consulted for antibiotics. Cardiology has been consulted for consideration of TEE. Patient denies previous cardiac history; states she had TEE roughly two months  ago in [place].  Has never seen an outpatient cardiologist for any reason.       Currently patient denies active chest pain or pressure, dyspnea, PND, orthopnea, palpitations, syncope, edema, nausea/vomiting, and diaphoresis.       Pertinent diagnostic findings:      Chest X-ray: No acute cardiopulmonary disease       Echocardiogram 12/26/2017: Small LV, normal wall thickness with normal systolic function and a EF of 77%. No regional wall motion abnormalities are noted. Grade I diastolic dysfunction. Normal size RV,  normal systolic function. No hemodynamic  Valvular pathology or significant regurgitation. Estimated RVSP 23 mmHg. Negative bubble study for interatrial shunting.            Past Medical History:        Diagnosis  Date         ?  Diabetes ([place])       ?  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  H/O stem cell transplant ([place])  041/04/19     ?  Hx antineoplastic chemotherapy       ?  Hypertension           ?  Leukemia ([place])          History reviewed. No pertinent surgical history.     Social History          Socioeconomic History         ?  Marital status:  DIVORCED              Spouse name:  Not on file         ?  Number of children:  Not on file     ?  Years of education:  Not on file     ?  Highest education level:  Not on file       Occupational History        ?  Not on file       Social Needs         ?  Financial resource strain:  Not on file        ?  Food insecurity:              Worry:  Not on file         Inability:  Not on file        ?  Transportation needs:              Medical:  Not on file         Non-medical:  Not on file       Tobacco Use         ?  Smoking status:  Never Smoker     ?  Smokeless tobacco:  Never Used       Substance and Sexual Activity         ?  Alcohol use:  No              Frequency:  Never         ?  Drug use:  No     ?  Sexual activity:  Not on file       Lifestyle        ?  Physical activity:              Days per week:  Not on file         Minutes per session:   Not on file         ?  Stress:  Not on file       Relationships        ?  Social connections:              Talks on phone:  Not on file         Gets together:  Not on file         Attends religious service:  Not on file         Active member of club or organization:  Not on file         Attends meetings of clubs or organizations:  Not on file         Relationship status:  Not on file        ?  Intimate partner violence:              Fear of current or ex partner:  Not on file         Emotionally abused:  Not on file         Physically abused:  Not on file         Forced sexual activity:  Not on file        Other Topics  Concern        ?  Not on file       Social History Narrative        ?  Not on file        History reviewed. No pertinent family history.        Allergies        Allergen  Reactions         ?  Pcn [Penicillins]  Hives and Itching                Review of Systems:     (Positives in [place])   General: negative for chills, diaphoresis, fever, malaise/ fatigue and weight loss   Neurological: negative for seizure, tremor, neuropathy, LOC, dizziness, assistive devices (walker, cane, wheelchair), falls in the past 6  months     HEENT: negative for congestion, hearing loss, nose bleeds, sore throat, stridor, vision changes   Cardiovascular: negative for chest pain/pressure, claudication, leg swelling, orthopnea, palpitations, PND, edema, syncope   Pulmonary: negative for cough, hemoptysis, SOB/ DOE, sputum production, wheezing   Gastrointestinal: negative for abdominal pain, heartburn, blood in stool, constipation, nausea/vomiting   Genitourinary: negative for dysuria, hematuria, frequency, urgency     Endocrine: negative for thyroid disease,    Hematologic/lymphatic: negative for  easy bruising/bleeding, iron or blood transfusions    Integument: negative for rash, non-healing sores   Musculoskeletal: back pain, joint pain, neck pain   Psych: negative for anxiety, depression, insomnia, suicidal ideals,  psychosis or ongoing psychiatric treatment.          Physical Assessment:        Visit Vitals      BP  121/70 (BP 1 Location: Right arm, BP Patient Position: Supine)     Pulse  73     Temp  98.5 ??F (36.9 ??C)     Resp  17     Ht  5' (1.524 m)     Wt  66 kg (145 lb 8.1 oz)     SpO2  100%        BMI  22.12 kg/m??           General: Alert and oriented, no acute distress    HEENT: Oral mucosa well perfused; conjunctiva not injected   Neck: No JVD, supple   Respiratory: Clear to auscultation bilaterally; No wheezes or rales   Cardiovascular: regular rhythm and rate, normal s1and s2; No murmurs or rubs   Abdomen: Soft, Nontender; ND   Extremities: No clubbing, cyanosis or edema   Neuro: Alert and oriented, moves all four extremities   Musculoskeletal: Moving all extremities, no joint tenderness, swelling, or edema   Skin: Warm, Dry, Intact      Labs:   CBC w/Diff     Lab Results         Component  Value  Date/Time            WBC  4.1  12/29/2017 04:40 AM       RBC  2.77 (L)  12/29/2017 04:40 AM       HCT  28.2 (L)  12/29/2017 04:40 AM       MCV  101.8 (H)  12/29/2017 04:40 AM       MCH  33.9  12/29/2017 04:40 AM       MCHC  33.3  12/29/2017 04:40 AM            RDW  15.3 (H)  04/24/2017 07:05 PM          Lab Results         Component  Value  Date/Time            BANDS  2.9  12/29/2017 04:40 AM       MONOS  6.8  12/29/2017 04:40 AM       EOS  1.0  12/25/2017 12:10 AM            BASOS  0.0  12/25/2017 12:10 AM           Basic Metabolic Profile     Lab Results         Component  Value  Date            NA  140  12/29/2017       CO2  28  12/29/2017            BUN  10  12/29/2017           Cardiac Enzymes     Lab Results         Component  Value  Date            CPK  24 (L)  12/25/2017            CKMB  1.9  04/24/2017              Thyroid Studies     Lab Results         Component  Value  Date/Time            TSH  1.050  12/24/2017 10:30 PM        No components found for: T3, FREET3, T3UPTAKE, THYROXINBDGL      Coagulation     Lab  Results         Component  Value  Date            INR  1.0  12/25/2017             Lab Results         Component  Value  Date/Time            HDL  57  12/26/2017 04:40 AM           Hepatic Function   No results found for: ALBUMIN              Home Medications:          Prior to Admission medications             Medication  Sig  Start Date  End Date  Taking?  Authorizing Provider            DULoxetine (CYMBALTA) 20 mg capsule  Take 20 mg by mouth daily. Indications: Patient stated to eliminated bone pain      Yes  Provider, Historical     magnesium oxide (MAG-OX) 400 mg tablet  Take 400 mg by mouth two (2) times a day.      Yes  Provider, Historical     pantoprazole (PROTONIX) 40 mg tablet  Take 40 mg by mouth daily.      Yes  Provider, Historical     prochlorperazine (COMPAZINE) 10 mg tablet  Take 10 mg by mouth every four (4) hours as needed.      Yes  Other, Phys, MD     budesonide (ENTOCORT EC) 3 mg capsule  Take 6 mg by mouth three (3) times daily.      Yes  Other, Phys, MD     nilotinib (TASIGNA) 200 mg cap capsule  Take 200 mg by mouth two (2) times a day.      Yes  Other, Phys, MD     Magnesium Oxide-Mg AA Chelate (MG-PLUS) 133 mg tab  Take 1 Tab by mouth three (3) times daily.      Yes  Other, Phys, MD     magnesium sulfate in water (MAGNESIUM SULFATE 2  G/50 ML) 2 gram/50 mL (4 %) IVPB  2-4 g by IntraVENous route as needed. 2 gm IV over 2 hours for magnesium level 1.5-1.7   4gm IV over 4 hours for magnesium level < 1.5   HOLD if magnesium >= 1.8   Infusion based on results drawn on Mondays and Thursdays. May be given daily up to 2x/week   Indications: low amount of magnesium in the blood      Yes  Other, Phys, MD     cholecalciferol, VITAMIN D3, (VITAMIN D3) 5,000 unit tab tablet  Take 5,000 Units by mouth daily.      Yes  Other, Phys, MD     0.9 % sodium chloride (SODIUM CHLORIDE 0.9 % IV)  1,000 mL by IntraVENous route See Admin Instructions. 1 L IV up to 2 times a week.   IVF to be given at 250 ml/hr over  4 hours if creatinine > or = 1.3 when drawn on Mondays and Thursdays      Yes  Other, Phys, MD     tacrolimus (PROGRAF) 0.5 mg capsule  Take 4 mg by mouth two (2) times a day.      Yes  Other, Phys, MD     trimethoprim-sulfamethoxazole (BACTRIM DS) 160-800 mg per tablet  Take 1 Tab by mouth See Admin Instructions. 1 tab orally Monday, Wednesday, Friday      Yes  Other, Phys, MD     oxyCODONE IR (ROXICODONE) 5 mg immediate release tablet  Take 2 Tabs by mouth every four (4) hours as needed. Max Daily Amount: 60 mg.  04/26/17    Yes  Tonny Bollman, MD     acyclovir (ZOVIRAX) 400 mg tablet  Take 400 mg by mouth two (2) times a day.      Yes  Other, Phys, MD     insulin lispro (HUMALOG JUNIOR KWIKPEN U-100) 100 unit/mL inph  by SubCUTAneous route three (3) times daily.      Yes  Other, Phys, MD     potassium chloride SR (K-TAB) 20 mEq tablet  Take 40 mEq by mouth two (2) times a day.      Yes  Other, Phys, MD     insulin degludec (TRESIBA FLEXTOUCH U-200) 200 unit/mL (3 mL) inpn  30 Units by SubCUTAneous route daily.      Yes  Other, Phys, MD            zolpidem (AMBIEN) 5 mg tablet  Take 5 mg by mouth nightly.      Yes  Other, Phys, MD           Inpt meds:     Current Facility-Administered Medications          Medication  Dose  Route  Frequency           ?  nystatin (MYCOSTATIN) 100,000 unit/mL oral suspension 500,000 Units   500,000 Units  Oral  QID     ?  vancomycin (VANCOCIN) 1,250 mg in 0.9% sodium chloride 250 mL IVPB   1,250 mg  IntraVENous  Q12H     ?  sodium chloride (NS) flush 10 mL   10 mL  InterCATHeter  DAILY     ?  heparin (porcine) pf 50 Units   50 Units  InterCATHeter  DAILY     ?  DULoxetine (CYMBALTA) capsule 20 mg   20 mg  Oral  DAILY     ?  magnesium oxide (MAG-OX) tablet 400 mg  400 mg  Oral  BID     ?  pantoprazole (PROTONIX) tablet 40 mg   40 mg  Oral  DAILY     ?  acyclovir (ZOVIRAX) tablet 400 mg   400 mg  Oral  BID     ?  cholecalciferol (VITAMIN D3) tablet 5,000 Units   5,000 Units  Oral   DAILY     ?  insulin glargine (LANTUS) injection 1-100 Units   1-100 Units  SubCUTAneous  QHS     ?  insulin lispro (HUMALOG) injection 1-100 Units   1-100 Units  SubCUTAneous  AC&HS     ?  potassium chloride (K-DUR, KLOR-CON) SR tablet 40 mEq   40 mEq  Oral  BID     ?  tacrolimus (PROGRAF) capsule 4 mg   4 mg  Oral  BID     ?  trimethoprim-sulfamethoxazole (BACTRIM DS, SEPTRA DS) 160-800 mg per tablet 1 Tab   1 Tab  Oral  Q MON, WED & FRI     ?  nilotinib (TASIGNA) capsule cap 200 mg  (Patient Supplied)   200 mg  Oral  BID     ?  LORazepam (ATIVAN) tablet 1 mg   1 mg  Oral  ON CALL     ?  budesonide (ENTOCORT EC) capsule 6 mg   6 mg  Oral  TID           ?  *Pharmacy dosing vancomycin   1 Each  Other  Rx Dosing/Monitoring                 Tonye Pearson, PA-C   [facility]    August 20, 20198:23 AM   Pager [phone]

## 2017-12-30 NOTE — Progress Notes (Signed)
TEE completed

## 2017-12-30 NOTE — Progress Notes (Signed)
Hospitalist Progress Note  Kristen Loader, MD  [facility]    Daily Progress Note: 12/30/2017    Assessment/Plan:     Possible TIA  Pt has multiple positive BCx with Staph epi.  PICC tip was positive for S epi.  Pt with TIA and faint murmur on exam.  Possiblility of septic emboli  TEE 8/20 was negative for vegetations. BE ruled out    Staph epidermidis Sepsis  Immunocompromised pt  TEE 8/20 was negative for vegetations  ID is following  Pt is on Bactrim and Vancomycin    Diplopia with transient slurred speech, LUE weakness, gait disturbances.   Possible TIA  Work-up as above  Sx have resolved.  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  I spoke with her lead BMT oncologist and he noted to continue treatment here for infxn and once stable for discharge to d/c home so she can go to [place] on her own.  Pt is on Tasigna 252m BID    Esophagitis and diarrhea d/t GVH dz   Seems to have improved.   Pt is on Compazine PRN    Oral thrush.  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    DM2 uncontrolled with hyperglycemia  Glucomander    Hypomagnesemia.  Replete and recheck  Pt noted she gets routine IV Mag  Continue PO magOx.    Chronic pain  APAP and Oxycodone PRN    DVT Prophylaxis.  SCDs  No heparin due to plt <100    Code status. Full code.    Disposition.  S/p TEE 8/20 which was neg for vegetations  Continue abx per ID  When medically stable, will plan to d/c home and have [place] f/[place]   ------------------    36 minutes were spent on the patient of which more than 50% was spent in  coordination of care and counseling (time spent with patient/family face to face, physical exam, reviewing laboratory and imaging investigations, speaking with physicians and nursing staff involved in this patient's care)    ------------------  Physical exam:  General:  Alert, NAD, pleasant.  Cooperative  HEENT: Sclera anicteric, PERRL, OM moist, throat clear. Oral thrush  Chest:  CTA B, no wheeze, no rales, no rhonchi.  Good air movement.  CV: RRR, S1/S2 were normal, no murmur  Abdomen: NTND, soft, NABS, no masses were noted.  No rebound, no guarding.  Extremities: No edema.    Subjective:   Pt noted that she feels okay.  We discussed the plans for a TEE this morning.  No Cp, no f/C, no abd pain    Objective:     Visit Vitals  BP 123/64 (BP Patient Position: Supine)   Pulse 67   Temp 97.9 ??F (36.6 ??C)   Resp 17   Ht 5' (1.524 m)   Wt 66 kg (145 lb 8.1 oz)   SpO2 100%   BMI 22.12 kg/m??      O2 Device: Room air    Temp (24hrs), Avg:98.3 ??F (36.8 ??C), Min:97.3 ??F (36.3 ??C), Max:98.9 ??F (37.2 ??C)  No intake/output data recorded.   08/18 1901 - 08/20 0700  In: 890 [P.O.:540; I.V.:350]  Out: -       Data Review:       Recent Days:  Recent Labs     12/29/17  0440   WBC 4.1   HGB 9.4*   HCT 28.2*   PLT 64*     Recent Labs     12/29/17  0440 12/28/17  1201   NA 140  --    K 5.1  --    CL 107  --    CO2 28  --    GLU 236*  --    BUN 10  --    CREA 0.8 0.8   CA 8.9  --    MG 1.4*  --      No results for input(s): PH, PCO2, PO2, HCO3, FIO2 in the last 72 hours.    24 Hour Results:  Recent Results (from the past 24 hour(s))   GLUCOSE, POC    Collection Time: 12/29/17  5:53 PM   Result Value Ref Range    Glucose (POC) 138 (H) 65 - 105 mg/dL   GLUCOSE, POC    Collection Time: 12/29/17  9:28 PM   Result Value Ref Range    Glucose (POC) 279 (H) 65 - 105 mg/dL   GLUCOSE, POC    Collection Time: 12/30/17  8:26 AM   Result Value Ref Range    Glucose (POC) 204 (H) 65 - 105 mg/dL   GLUCOSE, POC    Collection Time: 12/30/17 11:03 AM    Result Value Ref Range    Glucose (POC) 167 (H) 65 - 105 mg/dL   GLUCOSE, POC    Collection Time: 12/30/17  3:56 PM   Result Value Ref Range    Glucose (POC) 140 (H) 65 - 105 mg/dL       Xr Chest Pa Lat    Result Date: 12/24/2017  Chest Indication: hx of GVHD with N/V/D.    Comparison: 05/20/2017 Findings: Frontal and lateral views of the chest demonstrate that the cardiac silhouette is not enlarged.  There is no focal infiltrate, pleural effusion, vascular congestion, or pneumothorax.  Left PICC with tip overlying the distal superior vena cava.     Impression: No acute cardiopulmonary disease.     Mri Brain Wo Cont    Result Date: 12/26/2017  EXAMINATION: MRI BRAIN WO CONT CLINICAL INDICATION: Diplopia; TIA bone marrow transplant. Blurred vision COMPARISON:  None. TECHNIQUE: T1 and T2-weighted sequences of the brain were obtained in multiple planes without contrast. FINDINGS: No restricted diffusion. No masses, no midline shift. No hydrocephalus. No extra-axial fluid collections. Visualized portions of the orbits grossly unremarkable. Mastoid air cells well aerated. Visualized portions of paranasal sinuses unremarkable. Intracranial flow voids of great vessels, grossly intact. Diffuse low signal marrow. Age-appropriate atrophy. No susceptibility artifact on gradient echo imaging to suggest hemorrhage. Cerebellar tonsil extends into the foramen magnum 2 mm or so. Pituitary gland normal. Orbits appear normal. No compression of optic chiasm extraocular muscles are normal in size. No orbital masses. Several foci of T2 prolongation in corona radiata and centrum semiovale. Largest in the right frontal lobe measures 8 mm and in the left parietal lobe millimeters 10 mm. Several additional smaller lesions are noted in the frontal and parietal lobes.     IMPRESSION: 1.   Nonspecific white matter hyperintensities likely due to chronic microvascular ischemic changes. Iatrogenic drug toxicity,  graft-versus-host, inflammatory/infectious, hypertension, demyelinating  process, trauma, chronic headaches, sequelae of vasculitis are other considerations. 2. Low-lying cerebellar tonsils. 3. No orbital masses. No infarction.     Mri Brain W Cont    Result Date: 12/27/2017  MRI OF THE BRAIN INDICATION: diplopia.    COMPARISON: Noncontrast MRI 12/26/2017 TECHNIQUE: Multiplanar, multisequence MRI of the brain was performed without intravenous contrast.  FINDINGS: The patient returned for contrast-enhanced imaging of the brain. Images are degraded by patient motion artifacts. However, there is no abnormal parenchymal, dural, or meningeal enhancement. No gross sellar mass.     IMPRESSION: 1.  No abnormal enhancement to suggest active infectious/inflammatory/neoplastic process     Ct Head Wo Cont    Result Date: 12/25/2017  DICOM format image data is available to non-affiliated external healthcare facilities or entities on a secure, media free, reciprocally searchable basis with patient authorization for 12 months following the date of the study. Clinical history: Diplopia EXAMINATION: Noncontrast CT scan of the head 12/24/2017. 4 mm axial scanning is performed from the skull base to the vertex. Coronal and sagittal reconstruction imaging has been obtained. Correlation: None FINDINGS: Visualized paranasal sinuses and mastoid air cells are clear. No acute intracranial hemorrhage, mass or infarction.     IMPRESSION: Normal unenhanced CT scan of the head. Initial interpretation: [facility]     Cta Head    Result Date: 12/26/2017  CTA OF THE HEAD INDICATION: Nausea, sweating, vomiting, diarrhea COMPARISON: MRI 12/26/2017, CT 12/24/2017 TECHNIQUE: Noncontrast CT the head performed followed by CTA of the head with intravenous contrast. Multiplanar two-dimensional and three-dimensional maximum intensity projection reformats performed and reviewed. DICOM format image data is  available to non-affiliated external healthcare facilities or entities on a secure, media free, reciprocally searchable basis with patient authorization for 12 months following the date of the study. FINDINGS: Noncontrast CT of the head demonstrates no acute segmental infarct, intracranial hemorrhage, mass, midline shift, extra-axial fluid collection, or hydrocephalus. The vertebral arteries are codominant. There is no focal hemodynamically significant stenosis within the anterior or posterior circulation. The anterior and posterior communicating arteries appear patent. There is no definitive aneurysm.     IMPRESSION: No focal hemodynamically significant stenosis.     Cta Neck    Result Date: 12/26/2017  CTA OF THE NECK WITH CONTRAST INDICATION: Speech difficulty, and balance COMPARISON: No relevant studies. TECHNIQUE: CTA of the neck was performed with nonionic intravenous contrast with multiplanar 2-dimensional and maximum intensity projection three-dimensional images. Stenosis was evaluated using the NASCET criteria. DICOM format image data is available to non-affiliated external healthcare facilities or entities on a secure, media free, reciprocally searchable basis with patient authorization for 12 months following the date of the study. FINDINGS: There is no hemodynamically significant stenosis within the carotid or vertebral arteries. There is no evidence of dissection. CAROTID STENOSIS REFERENCE USING NASCET CRITERIA % Stenosis = (1 - narrowest diameter/diameter of distal artery) x100 Mild: < 50% stenosis Moderate: 50-69% stenosis. Severe: 70-94% stenosis. Near occlusion: 95-99% stenosis. Occluded: 100% stenosis     IMPRESSION: No focal hemodynamically significant stenosis.       Microbiology:  All Micro Results     Procedure Component Value Units Date/Time    CULTURE, BLOOD [[id number]]     Order Status:  Sent Specimen:  Blood     CULTURE, BLOOD [[id number]]     Order Status:  Sent Specimen:  Blood      CULTURE, CATHETER TIP [[id number]]  (Abnormal)  (Susceptibility) Collected:  12/27/17 1550    Order Status:  Completed Specimen:  Catheter Tip from PICC Updated:  12/30/17 1106     Isolate --        >15 CFU  Staphylococcus epidermidis      CULTURE, BLOOD [[id number]]  (Abnormal) Collected:  12/28/17 0249    Order Status:  Completed Specimen:  Blood Updated:  12/30/17 0743     Blood Culture Result --        Gram stain Gram Positive Cocci In Clusters  Called to/read back DEREK BOZMAN 6EST  on 12/29/2017  at 0600  by VGP       Isolate       Coagulase Negative Staphylococcus          CULTURE, BLOOD [[id number]] Collected:  12/28/17 0249    Order Status:  Completed Specimen:  Blood Updated:  12/30/17 0716     Blood Culture Result No Growth At 48 Hours       CULTURE, BLOOD [[id number]] Collected:  12/29/17 1830    Order Status:  Canceled Specimen:  Blood     CULTURE, BLOOD [[id number]] Collected:  12/29/17 1830    Order Status:  Canceled Specimen:  Blood     CULTURE, BLOOD [[id number]]  (Abnormal) Collected:  12/26/17 1240    Order Status:  Completed Specimen:  Blood from [facility] Picc Updated:  12/28/17 0915     Blood Culture Result       Gram stain Gram Positive Cocci In Clusters           Isolate --        Staphylococcus epidermidis  See Previous Culture For Susceptibility Report      CULTURE, BLOOD [[id number]]  (Abnormal) Collected:  12/26/17 1246    Order Status:  Completed Specimen:  Blood from PICC Updated:  12/28/17 0913     Blood Culture Result --        Gram stain Gram Positive Cocci In Clusters  Called to/read back sissy  on 12/27/17  at [place]  by [id number]       Isolate --        Staphylococcus epidermidis  See Previous Culture For Susceptibility Report      CULTURE, BLOOD [[id number]]  (Abnormal) Collected:  12/24/17 2255    Order Status:  Completed Specimen:  Blood from [facility] Picc Updated:  12/27/17 0910     Blood Culture Result --        Gram stain Gram Positive Cocci In Clusters  Called to/read back jannet  on 12/25/17   at 1751  by [id number]       Isolate --        See Previous Culture For Susceptibility Report  Staphylococcus epidermidis      CULTURE, BLOOD [[id number]]  (Abnormal)  (Susceptibility) Collected:  12/24/17 2230    Order Status:  Completed Specimen:  Blood from [facility] Picc Updated:  12/27/17 0749     Blood Culture Result --        Gram stain Gram Positive Cocci In Clusters  Called to/read back jannet  on 12/25/17  at 1751  by [id number]       Isolate       Staphylococcus epidermidis          CULTURE, BLOOD FOR MRSA/SA BY PCR [[id number]] Collected:  12/24/17 0951    Order Status:  Completed Specimen:  Blood Updated:  12/26/17 1133     Blood culture, MRSA by PCR NEGATIVE        Blood culture, S. aureus by PCR NEGATIVE  Positive culture accn number [id number]        Comment: This PCR testing was performed from the blood culture accession number that is noted. Refer to  this accession number for antibiotic susceptibility testing and final identification.         CULTURE, URINE [[id number]]  (Abnormal) Collected:  12/24/17 2255    Order Status:  Completed Specimen:  Urine from Clean catch Updated:  12/26/17 1021     Culture result --        30,000 CFU/mL  Mixed Flora, three or more different organisms present  No Further Workup             Cardiology:  Results for orders placed or performed during the hospital encounter of 12/24/17   EKG, 12 LEAD, INITIAL   Result Value Ref Range    Ventricular Rate 104 BPM    Atrial Rate 104 BPM    P-R Interval 148 ms    QRS Duration 66 ms    Q-T Interval 328 ms    QTC Calculation (Bezet) 431 ms    Calculated P Axis 18 degrees    Calculated R Axis 17 degrees    Calculated T Axis 16 degrees    Diagnosis       Sinus tachycardia  Otherwise normal ECG  No previous ECGs available  Confirmed by Marrion Coy, M.D., Ellin Saba. (30) on 12/25/2017 7:41:35 AM           Problem List:  Problem List as of 12/30/2017 Date Reviewed: 01-22-18          Codes Class Noted - Resolved    Bacteremia ICD-10-CM: R78.81   ICD-9-CM: 790.7  12/26/2017 - Present        TIA (transient ischemic attack) ICD-10-CM: G45.9  ICD-9-CM: 435.9  12/25/2017 - Present        Thrombocytopenia ([place]) ICD-10-CM: D69.6  ICD-9-CM: 287.5  05/13/2017 - Present        Neutropenic fever (HCC) ICD-10-CM: D70.9, R50.81  ICD-9-CM: 288.00, 780.61  05/13/2017 - Present        Chest pressure ICD-10-CM: R07.89  ICD-9-CM: 786.59  04/25/2017 - Present        Elevated troponin ICD-10-CM: R74.8  ICD-9-CM: 790.6  04/25/2017 - Present              Medications reviewed  Current Facility-Administered Medications   Medication Dose Route Frequency   ??? 0.9% sodium chloride infusion  83 mL/hr IntraVENous CONTINUOUS   ??? fentaNYL citrate (PF) injection 12.5-50 mcg  12.5-50 mcg IntraVENous PRN   ??? lidocaine (XYLOCAINE) 2 % viscous solution 15 mL  15 mL Mouth/Throat PRN   ??? midazolam (VERSED) injection 0.25-2 mg  0.25-2 mg IntraVENous PRN   ??? naloxone (NARCAN) injection 0.4 mg  0.4 mg IntraVENous RAD ONCE   ??? flumazenil (ROMAZICON) 0.1 mg/mL injection 0.5 mg  0.5 mg IntraVENous RAD ONCE   ??? [START ON 12/31/2017] vancomycin trough draw on 8/21 at 1000   Other ONCE   ??? nystatin (MYCOSTATIN) 100,000 unit/mL oral suspension 500,000 Units  500,000 Units Oral QID   ??? vancomycin (VANCOCIN) 1,250 mg in 0.9% sodium chloride 250 mL IVPB  1,250 mg IntraVENous Q12H   ??? sodium chloride (NS) flush 10 mL  10 mL InterCATHeter DAILY   ??? sodium chloride (NS) flush 10-20 mL  10-20 mL InterCATHeter PRN   ??? heparin (porcine) pf 50 Units  50 Units InterCATHeter PRN   ??? heparin (porcine) pf 50 Units  50 Units InterCATHeter  DAILY   ??? DULoxetine (CYMBALTA) capsule 20 mg  20 mg Oral DAILY   ??? magnesium oxide (MAG-OX) tablet 400 mg  400 mg Oral BID   ??? pantoprazole (PROTONIX) tablet 40 mg  40 mg Oral DAILY   ??? acyclovir (ZOVIRAX) tablet 400 mg  400 mg Oral BID   ??? cholecalciferol (VITAMIN D3) tablet 5,000 Units  5,000 Units Oral DAILY   ??? dextrose (D50) infusion 5-25 g  10-50 mL IntraVENous PRN    ??? glucagon (GLUCAGEN) injection 1 mg  1 mg IntraMUSCular PRN   ??? insulin glargine (LANTUS) injection 1-100 Units  1-100 Units SubCUTAneous QHS   ??? insulin lispro (HUMALOG) injection 1-100 Units  1-100 Units SubCUTAneous AC&HS   ??? insulin lispro (HUMALOG) injection 1-100 Units  1-100 Units SubCUTAneous PRN   ??? oxyCODONE IR (ROXICODONE) tablet 10 mg  10 mg Oral Q4H PRN   ??? potassium chloride (K-DUR, KLOR-CON) SR tablet 40 mEq  40 mEq Oral BID   ??? tacrolimus (PROGRAF) capsule 4 mg  4 mg Oral BID   ??? prochlorperazine (COMPAZINE) tablet 10 mg  10 mg Oral Q4H PRN   ??? zolpidem (AMBIEN) tablet 5 mg  5 mg Oral QHS PRN   ??? trimethoprim-sulfamethoxazole (BACTRIM DS, SEPTRA DS) 160-800 mg per tablet 1 Tab  1 Tab Oral Q MON, WED & FRI   ??? naloxone (NARCAN) injection 0.1 mg  0.1 mg IntraVENous PRN   ??? acetaminophen (TYLENOL) tablet 650 mg  650 mg Oral Q4H PRN    Or   ??? acetaminophen (TYLENOL) solution 650 mg  650 mg Oral Q4H PRN    Or   ??? acetaminophen (TYLENOL) suppository 650 mg  650 mg Rectal Q4H PRN   ??? nilotinib (TASIGNA) capsule cap 200 mg  (Patient Supplied)  200 mg Oral BID   ??? LORazepam (ATIVAN) tablet 1 mg  1 mg Oral ON CALL   ??? budesonide (ENTOCORT EC) capsule 6 mg  6 mg Oral TID   ??? *Pharmacy dosing vancomycin  1 Each Other Rx Dosing/Monitoring         Kristen Loader, MD

## 2017-12-30 NOTE — Progress Notes (Signed)
Discharge plan to home, no need identified at this time.

## 2017-12-30 NOTE — Progress Notes (Signed)
Paged PICC:  Pt: [name] Wu  Rm: [phone]  Need new PIV for IV Vanco. Pt has known poor vascular status.   Thank you,  Amber Smith,RN  ext [phone]

## 2017-12-30 NOTE — Brief Op Note (Addendum)
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 Informed consent was obtained and timeout was performed with all team members presents. Throat was sprayed with lidocaine spray. Pt received a total of 2mg  Versed and 50mg  Fentanyl. TEE probe was passed on first attempt without any difficulty.     Preliminary TEE Findings:  - Normal LV systolic function  - No valvular vegetations  - No significant valvular stenosis or regurgitation  - No LAA thrombus  - Bubble study was negative    Pt tolerated procedure well, no complications.   Final TEE report to follow.    Findings discussed with Dr. Nira Retort.    Maurice Small, MD  [facility]

## 2017-12-30 NOTE — Consults (Signed)
[facility] Consultation Note      Referring Physician: Dr. Nira Retort   Outpatient Cardiologist: None   PCP: Other, Phys, MD     Impression:   1. Bacteremia   ?? Blood cultures +staph epidermidis   ?? Antibiotics per ID, currently on bactrim and vancomycin   2. 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 Follows in [place] for treatment, locally follows with Dr. Edd Arbour   3. Numbness, weakness; resolved   ?? MRI brain: no acute changes  ?? Possible TIA  ?? Neurology following   4. Diabetes mellitus type 2, uncontrolled  ?? A1c: 7.0   5. Hypertension  6. Pancytopenia  7. 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 Oral thrush   9. Chronic pain  10. H/o prior meningeal leukemia   11. Immunocompromised     Recommendations:   1. Plan for TEE today  2. Keep patient NPO   3. Further recommendations pending results of TEE     Final recommendations per cardiologist.    Chief Complaint   Patient presents with   ??? Nausea   ??? Double Vision   ??? Diarrhea     Admission diagnosis: <principal problem not specified>  Patient Active Problem List   Diagnosis Code   ??? Chest pressure R07.89   ??? Elevated troponin R74.8   ??? Thrombocytopenia ([place]) D69.6   ??? Neutropenic fever ([facility]) D70.9, R50.81   ??? TIA (transient ischemic attack) G45.9   ??? Bacteremia R78.81       HPI:   [name] Wu is a 57 y.o. female who is being seen on consult for possible TEE. She has a past medical history significant for AML s/p chemotherapy and stem cell transplant, pancytopenia, PICC in situ, and diabetes mellitus. She presented to [facility] ED on 8/14 due to diplopia, nausea/vomiting, and weakness/numbness of the left side. She reported a blood sugar in the 70s but drinking orange juice did not resolve symptoms. She was admitted for possible TIA with resolution of symptoms; MRI brain was negative for any acute changes. Blood cultures positive and ID  consulted for antibiotics. Cardiology has been consulted for consideration of TEE. Patient denies previous cardiac history; states she had TEE roughly two months ago in [place]. Has never seen an outpatient cardiologist for any reason.     Currently patient denies active chest pain or pressure, dyspnea, PND, orthopnea, palpitations, syncope, edema, nausea/vomiting, and diaphoresis.     Pertinent diagnostic findings:    Chest X-ray: No acute cardiopulmonary disease     Echocardiogram 12/26/2017: Small LV, normal wall thickness with normal systolic function and a EF of 77%. No regional wall motion abnormalities are noted. Grade I diastolic dysfunction. Normal size RV, normal systolic function. No hemodynamic  Valvular pathology or significant regurgitation. Estimated RVSP 23 mmHg. Negative bubble study for interatrial shunting.       Past Medical History:   Diagnosis Date   ??? Diabetes ([place])    ??? 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 H/O stem cell transplant ([place]) 041/04/19   ??? Hx antineoplastic chemotherapy    ??? Hypertension    ??? Leukemia ([place])      History reviewed. No pertinent surgical history.  Social History     Socioeconomic History   ??? Marital status: DIVORCED     Spouse name: Not on file   ??? Number of children: Not on file   ??? Years of education: Not on  file   ??? Highest education level: Not on file   Occupational History   ??? Not on file   Social Needs   ??? Financial resource strain: Not on file   ??? Food insecurity:     Worry: Not on file     Inability: Not on file   ??? Transportation needs:     Medical: Not on file     Non-medical: Not on file   Tobacco Use   ??? Smoking status: Never Smoker   ??? Smokeless tobacco: Never Used   Substance and Sexual Activity   ??? Alcohol use: No     Frequency: Never   ??? Drug use: No   ??? Sexual activity: Not on file   Lifestyle   ??? Physical activity:     Days per week: Not on file     Minutes per session: Not on file   ??? Stress: Not on file   Relationships    ??? Social connections:     Talks on phone: Not on file     Gets together: Not on file     Attends religious service: Not on file     Active member of club or organization: Not on file     Attends meetings of clubs or organizations: Not on file     Relationship status: Not on file   ??? Intimate partner violence:     Fear of current or ex partner: Not on file     Emotionally abused: Not on file     Physically abused: Not on file     Forced sexual activity: Not on file   Other Topics Concern   ??? Not on file   Social History Narrative   ??? Not on file     History reviewed. No pertinent family history.    Allergies   Allergen Reactions   ??? Pcn [Penicillins] Hives and Itching         Review of Systems:   (Positives in [place])  General: negative for chills, diaphoresis, fever, malaise/ fatigue and weight loss  Neurological: negative for seizure, tremor, neuropathy, LOC, dizziness, assistive devices (walker, cane, wheelchair), falls in the past 6 months    HEENT: negative for congestion, hearing loss, nose bleeds, sore throat, stridor, vision changes  Cardiovascular: negative for chest pain/pressure, claudication, leg swelling, orthopnea, palpitations, PND, edema, syncope  Pulmonary: negative for cough, hemoptysis, SOB/ DOE, sputum production, wheezing  Gastrointestinal: negative for abdominal pain, heartburn, blood in stool, constipation, nausea/vomiting  Genitourinary: negative for dysuria, hematuria, frequency, urgency    Endocrine: negative for thyroid disease,   Hematologic/lymphatic: negative for  easy bruising/bleeding, iron or blood transfusions   Integument: negative for rash, non-healing sores  Musculoskeletal: back pain, joint pain, neck pain  Psych: negative for anxiety, depression, insomnia, suicidal ideals, psychosis or ongoing psychiatric treatment.      Physical Assessment:     Visit Vitals  BP 121/70 (BP 1 Location: Right arm, BP Patient Position: Supine)   Pulse 73   Temp 98.5 ??F (36.9 ??C)   Resp 17    Ht 5' (1.524 m)   Wt 66 kg (145 lb 8.1 oz)   SpO2 100%   BMI 22.12 kg/m??       General: Alert and oriented, no acute distress   HEENT: Oral mucosa well perfused; conjunctiva not injected  Neck: No JVD, supple  Respiratory: Clear to auscultation bilaterally; No wheezes or rales  Cardiovascular: regular rhythm and rate, normal s1and s2; No  murmurs or rubs  Abdomen: Soft, Nontender; ND  Extremities: No clubbing, cyanosis or edema  Neuro: Alert and oriented, moves all four extremities  Musculoskeletal: Moving all extremities, no joint tenderness, swelling, or edema  Skin: Warm, Dry, Intact    Labs:  CBC w/Diff  Lab Results   Component Value Date/Time    WBC 4.1 12/29/2017 04:40 AM    RBC 2.77 (L) 12/29/2017 04:40 AM    HCT 28.2 (L) 12/29/2017 04:40 AM    MCV 101.8 (H) 12/29/2017 04:40 AM    MCH 33.9 12/29/2017 04:40 AM    MCHC 33.3 12/29/2017 04:40 AM    RDW 15.3 (H) 04/24/2017 07:05 PM     Lab Results   Component Value Date/Time    BANDS 2.9 12/29/2017 04:40 AM    MONOS 6.8 12/29/2017 04:40 AM    EOS 1.0 12/25/2017 12:10 AM    BASOS 0.0 12/25/2017 12:10 AM       Basic Metabolic Profile  Lab Results   Component Value Date    NA 140 12/29/2017    CO2 28 12/29/2017    BUN 10 12/29/2017       Cardiac Enzymes  Lab Results   Component Value Date    CPK 24 (L) 12/25/2017    CKMB 1.9 04/24/2017         Thyroid Studies  Lab Results   Component Value Date/Time    TSH 1.050 12/24/2017 10:30 PM     No components found for: T3, FREET3, T3UPTAKE, THYROXINBDGL    Coagulation  Lab Results   Component Value Date    INR 1.0 12/25/2017       Lab Results   Component Value Date/Time    HDL 57 12/26/2017 04:40 AM       Hepatic Function  No results found for: ALBUMIN        Home Medications:     Prior to Admission medications    Medication Sig Start Date End Date Taking? Authorizing Provider   DULoxetine (CYMBALTA) 20 mg capsule Take 20 mg by mouth daily. Indications: Patient stated to eliminated bone pain   Yes Provider, Historical    magnesium oxide (MAG-OX) 400 mg tablet Take 400 mg by mouth two (2) times a day.   Yes Provider, Historical   pantoprazole (PROTONIX) 40 mg tablet Take 40 mg by mouth daily.   Yes Provider, Historical   prochlorperazine (COMPAZINE) 10 mg tablet Take 10 mg by mouth every four (4) hours as needed.   Yes Other, Phys, MD   budesonide (ENTOCORT EC) 3 mg capsule Take 6 mg by mouth three (3) times daily.   Yes Other, Phys, MD   nilotinib (TASIGNA) 200 mg cap capsule Take 200 mg by mouth two (2) times a day.   Yes Other, Phys, MD   Magnesium Oxide-Mg AA Chelate (MG-PLUS) 133 mg tab Take 1 Tab by mouth three (3) times daily.   Yes Other, Phys, MD   magnesium sulfate in water (MAGNESIUM SULFATE 2 G/50 ML) 2 gram/50 mL (4 %) IVPB 2-4 g by IntraVENous route as needed. 2 gm IV over 2 hours for magnesium level 1.5-1.7  4gm IV over 4 hours for magnesium level < 1.5  HOLD if magnesium >= 1.8  Infusion based on results drawn on Mondays and Thursdays. May be given daily up to 2x/week   Indications: low amount of magnesium in the blood   Yes Other, Phys, MD   cholecalciferol, VITAMIN D3, (VITAMIN D3) 5,000 unit tab tablet  Take 5,000 Units by mouth daily.   Yes Other, Phys, MD   0.9 % sodium chloride (SODIUM CHLORIDE 0.9 % IV) 1,000 mL by IntraVENous route See Admin Instructions. 1 L IV up to 2 times a week.  IVF to be given at 250 ml/hr over 4 hours if creatinine > or = 1.3 when drawn on Mondays and Thursdays   Yes Other, Phys, MD   tacrolimus (PROGRAF) 0.5 mg capsule Take 4 mg by mouth two (2) times a day.   Yes Other, Phys, MD   trimethoprim-sulfamethoxazole (BACTRIM DS) 160-800 mg per tablet Take 1 Tab by mouth See Admin Instructions. 1 tab orally Monday, Wednesday, Friday   Yes Other, Phys, MD   oxyCODONE IR (ROXICODONE) 5 mg immediate release tablet Take 2 Tabs by mouth every four (4) hours as needed. Max Daily Amount: 60 mg. 04/26/17  Yes Tonny Bollman, MD   acyclovir (ZOVIRAX) 400 mg tablet Take 400 mg by mouth two (2) times a  day.   Yes Other, Phys, MD   insulin lispro (HUMALOG JUNIOR KWIKPEN U-100) 100 unit/mL inph by SubCUTAneous route three (3) times daily.   Yes Other, Phys, MD   potassium chloride SR (K-TAB) 20 mEq tablet Take 40 mEq by mouth two (2) times a day.   Yes Other, Phys, MD   insulin degludec (TRESIBA FLEXTOUCH U-200) 200 unit/mL (3 mL) inpn 30 Units by SubCUTAneous route daily.   Yes Other, Phys, MD   zolpidem (AMBIEN) 5 mg tablet Take 5 mg by mouth nightly.   Yes Other, Phys, MD       Inpt meds:  Current Facility-Administered Medications   Medication Dose Route Frequency   ??? nystatin (MYCOSTATIN) 100,000 unit/mL oral suspension 500,000 Units  500,000 Units Oral QID   ??? vancomycin (VANCOCIN) 1,250 mg in 0.9% sodium chloride 250 mL IVPB  1,250 mg IntraVENous Q12H   ??? sodium chloride (NS) flush 10 mL  10 mL InterCATHeter DAILY   ??? heparin (porcine) pf 50 Units  50 Units InterCATHeter DAILY   ??? DULoxetine (CYMBALTA) capsule 20 mg  20 mg Oral DAILY   ??? magnesium oxide (MAG-OX) tablet 400 mg  400 mg Oral BID   ??? pantoprazole (PROTONIX) tablet 40 mg  40 mg Oral DAILY   ??? acyclovir (ZOVIRAX) tablet 400 mg  400 mg Oral BID   ??? cholecalciferol (VITAMIN D3) tablet 5,000 Units  5,000 Units Oral DAILY   ??? insulin glargine (LANTUS) injection 1-100 Units  1-100 Units SubCUTAneous QHS   ??? insulin lispro (HUMALOG) injection 1-100 Units  1-100 Units SubCUTAneous AC&HS   ??? potassium chloride (K-DUR, KLOR-CON) SR tablet 40 mEq  40 mEq Oral BID   ??? tacrolimus (PROGRAF) capsule 4 mg  4 mg Oral BID   ??? trimethoprim-sulfamethoxazole (BACTRIM DS, SEPTRA DS) 160-800 mg per tablet 1 Tab  1 Tab Oral Q MON, WED & FRI   ??? nilotinib (TASIGNA) capsule cap 200 mg  (Patient Supplied)  200 mg Oral BID   ??? LORazepam (ATIVAN) tablet 1 mg  1 mg Oral ON CALL   ??? budesonide (ENTOCORT EC) capsule 6 mg  6 mg Oral TID   ??? *Pharmacy dosing vancomycin  1 Each Other Rx Dosing/Monitoring           Tonye Pearson, PA-C  [facility]    August 20, 20198:23 AM  Pager [phone]

## 2017-12-30 NOTE — Progress Notes (Signed)
Infectious Disease Follow-up     Admit Date: 12/24/2017    Today's Date: 12/30/17    Current abx Prior abx   Prophylactic Bactrim tiw, ACV; vancomycin  8/16-4 meropenem 8/16-1     Assessment:     Pos bctx's GPC cl 2 of 2 8/14-MRSE, suspect early picc infection   -repeat bctx's 8/16 2 of 2 GPC cl incl from picc   -TTE no reported vegetation, bubble study negative    AML sp chemo/stem cell xplant  -h/o meningeal leukemia on IT chemo in [place] in [place], last ~1 mo ago per Tan's note --[phone]  -on tassigra comp GVH skin, esophagus   -bactrim tiw, acyclovir prophylaxis  -pancytopenia ch thrombocytopenia, not neutropenic ([facility] 8/15 1800)    L numbness, weakness n/v vision change diplopia began Monday 8/12- CTH neg   DM on insulin    HTN    PCN allergy       Rec:   ->continue iv vancomycin  ->monitor picc tip from 8/17 - positive for MRSE  ->monitor repeat bctx's from 8/18- now 1 of 2 + for CoNS  -> TEE 8/20 neg today    ->  Repeat bl cx x 2  to confirm clearance of bacteremia.        If 8/20 bl cx neg after 2-3 days may replace picc -- pt says need iv access 2x/week;       -> reviewed repeat MRI - neg      defer w/u for diplopia to Neurology, appears holding on LP for now, pt says better this am     MICROBIOLOGY:   8/14 bctx x 2 MRSE   uctx ngtd  8/16 bctx x 2 S epi see prior  8/17     Cath tip > S epi  8/18 bctx x 2 ip- 1 of 2  G+ cocci    LINES AND CATHETERS:   PICC    Active Hospital Problems    Diagnosis Date Noted   ??? Bacteremia 12/26/2017   ??? TIA (transient ischemic attack) 12/25/2017         Subjective:     Interval notes reviewed. Pt says feeling better, had TEE today which is neg for any endocarditis, since repeat blcx from 8/18 still positive, will repeat blcx today     Current Facility-Administered Medications   Medication Dose Route Frequency   ??? 0.9% sodium chloride infusion  83 mL/hr IntraVENous CONTINUOUS    ??? fentaNYL citrate (PF) injection 12.5-50 mcg  12.5-50 mcg IntraVENous PRN   ??? lidocaine (XYLOCAINE) 2 % viscous solution 15 mL  15 mL Mouth/Throat PRN   ??? midazolam (VERSED) injection 0.25-2 mg  0.25-2 mg IntraVENous PRN   ??? naloxone (NARCAN) injection 0.4 mg  0.4 mg IntraVENous RAD ONCE   ??? flumazenil (ROMAZICON) 0.1 mg/mL injection 0.5 mg  0.5 mg IntraVENous RAD ONCE   ??? [START ON 12/31/2017] vancomycin trough draw on 8/21 at 1000   Other ONCE   ??? nystatin (MYCOSTATIN) 100,000 unit/mL oral suspension 500,000 Units  500,000 Units Oral QID   ??? vancomycin (VANCOCIN) 1,250 mg in 0.9% sodium chloride 250 mL IVPB  1,250 mg IntraVENous Q12H   ??? sodium chloride (NS) flush 10 mL  10 mL InterCATHeter DAILY   ??? sodium chloride (NS) flush 10-20 mL  10-20 mL InterCATHeter PRN   ??? heparin (porcine) pf 50 Units  50 Units InterCATHeter PRN   ??? heparin (porcine) pf 50 Units  50 Units  InterCATHeter DAILY   ??? DULoxetine (CYMBALTA) capsule 20 mg  20 mg Oral DAILY   ??? magnesium oxide (MAG-OX) tablet 400 mg  400 mg Oral BID   ??? pantoprazole (PROTONIX) tablet 40 mg  40 mg Oral DAILY   ??? acyclovir (ZOVIRAX) tablet 400 mg  400 mg Oral BID   ??? cholecalciferol (VITAMIN D3) tablet 5,000 Units  5,000 Units Oral DAILY   ??? dextrose (D50) infusion 5-25 g  10-50 mL IntraVENous PRN   ??? glucagon (GLUCAGEN) injection 1 mg  1 mg IntraMUSCular PRN   ??? insulin glargine (LANTUS) injection 1-100 Units  1-100 Units SubCUTAneous QHS   ??? insulin lispro (HUMALOG) injection 1-100 Units  1-100 Units SubCUTAneous AC&HS   ??? insulin lispro (HUMALOG) injection 1-100 Units  1-100 Units SubCUTAneous PRN   ??? oxyCODONE IR (ROXICODONE) tablet 10 mg  10 mg Oral Q4H PRN   ??? potassium chloride (K-DUR, KLOR-CON) SR tablet 40 mEq  40 mEq Oral BID   ??? tacrolimus (PROGRAF) capsule 4 mg  4 mg Oral BID   ??? prochlorperazine (COMPAZINE) tablet 10 mg  10 mg Oral Q4H PRN   ??? zolpidem (AMBIEN) tablet 5 mg  5 mg Oral QHS PRN    ??? trimethoprim-sulfamethoxazole (BACTRIM DS, SEPTRA DS) 160-800 mg per tablet 1 Tab  1 Tab Oral Q MON, WED & FRI   ??? naloxone (NARCAN) injection 0.1 mg  0.1 mg IntraVENous PRN   ??? acetaminophen (TYLENOL) tablet 650 mg  650 mg Oral Q4H PRN    Or   ??? acetaminophen (TYLENOL) solution 650 mg  650 mg Oral Q4H PRN    Or   ??? acetaminophen (TYLENOL) suppository 650 mg  650 mg Rectal Q4H PRN   ??? nilotinib (TASIGNA) capsule cap 200 mg  (Patient Supplied)  200 mg Oral BID   ??? LORazepam (ATIVAN) tablet 1 mg  1 mg Oral ON CALL   ??? budesonide (ENTOCORT EC) capsule 6 mg  6 mg Oral TID   ??? *Pharmacy dosing vancomycin  1 Each Other Rx Dosing/Monitoring         Objective:     Visit Vitals  BP 123/64 (BP Patient Position: Supine)   Pulse 67   Temp 97.9 ??F (36.6 ??C)   Resp 17   Ht 5' (1.524 m)   Wt 66 kg (145 lb 8.1 oz)   SpO2 100%   BMI 22.12 kg/m??       Temp (24hrs), Avg:98.3 ??F (36.8 ??C), Min:97.3 ??F (36.3 ??C), Max:98.9 ??F (37.2 ??C)    GEN: WFWN BF appears stated age lying in bed NAD   HEENT: anicteric sclerae moist orophyarynx  NECK: Supple   CHEST: no rales rhonchi or wheeze  CVS: S1'S2' no rub murmur or gallop  ABD: soft non-tender (+) BS no masses non-distended  EXT: no edema LUE picc out     Labs: Results:   Chemistry Recent Labs     12/29/17  0440 12/28/17  1201   GLU 236*  --    NA 140  --    K 5.1  --    CL 107  --    CO2 28  --    BUN 10  --    CREA 0.8 0.8   CA 8.9  --    AGAP 5  --       CBC w/Diff Recent Labs     12/29/17  0440   WBC 4.1   RBC 2.77*   HGB 9.4*  HCT 28.2*   PLT 64*   GRANS 77.7*   LYMPH 12.6*        Mri brain 8/16 IMPRESSION:  1.   Nonspecific white matter hyperintensities likely due to chronic  microvascular ischemic changes. Iatrogenic drug toxicity, graft-versus-host,  inflammatory/infectious, hypertension, demyelinating process, trauma, chronic  headaches, sequelae of vasculitis are other considerations.  2. Low-lying cerebellar tonsils.  3. No orbital masses. No infarction    8/17 mri IMPRESSION:   ??  1.  No abnormal enhancement to suggest active infectious/inflammatory/neoplastic  process    Echo 8/16 Interpretation Summary  A complete two-dimensional transthoracic echocardiogram was performed (2D, M-  mode, Doppler and color flow Doppler).  The study was technically adequate.    Small left ventricle, norla wall thickness with normal systolic function and   a  calculated ejectio fraction of 77%.  No regional wall motion abnormalites are noted.  Grade 1 diastolic dysfunction.  Normal size right ventricle, normal systolic function.  No hemodynamic valvular pathology or signficiant regurgitation.  Estimated right ventricular systolic pressure: 64PPIR.  Negative bubble study for interatrial shunting.    Microbiology Results  No results for input(s): SDES, CULT in the last 72 hours.    Truman Hayward, MD  December 30, 2017  [facility]  [phone]

## 2017-12-30 NOTE — Progress Notes (Signed)
Bedside and Verbal shift change report given to [employer]   (oncoming nurse) by Lurline Hare (offgoing nurse). Report included the following information SBAR, Kardex, Intake/Output, MAR, Recent Results and Quality Measures.

## 2017-12-30 NOTE — Other (Signed)
TRANSFER - OUT REPORT:    Verbal report given to Estill Bamberg (name) on [name] Wu  being transferred to [place] (unit) for routine progression of care       Report consisted of patient???s Situation, Background, Assessment and   Recommendations(SBAR).     Information from the following report(s) Procedure Summary, MAR and Recent Results was reviewed with the receiving nurse.    Lines:   Peripheral IV 12/29/17 Right;Lower Cephalic (Active)   Site Assessment Clean, dry, & intact 12/30/2017 12:10 PM   Phlebitis Assessment 0 12/30/2017 12:10 PM   Infiltration Assessment 0 12/30/2017 12:10 PM   Dressing Status Clean, dry, & intact 12/30/2017 12:10 PM   Dressing Type Transparent;Tape 12/30/2017 12:10 PM   Hub Color/Line Status Patent;Flushed;Capped;Pink 12/30/2017 12:10 PM   Action Taken Open ports on tubing capped 12/29/2017  9:00 PM   Alcohol Cap Used Yes 12/30/2017 12:10 PM        Opportunity for questions and clarification was provided.      Patient transported with:   Registered Nurse

## 2017-12-31 ENCOUNTER — Inpatient Hospital Stay: Admit: 2017-12-31 | Payer: BLUE CROSS/BLUE SHIELD | Primary: Internal Medicine

## 2017-12-31 LAB — ANTINUCLEAR ANTIBODIES, IFA
Antinuclear Abs, IFA: NEGATIVE
Antinuclear Abs, IFA: NEGATIVE

## 2017-12-31 LAB — MYASTHENIA GRAVIS PANEL III
Acetylcholine Rec Binding: <0.3 nmol/L
Acetylcholine Rec Bloc Ab: <15 (ref <15)
Acetylcholine Rec Mod Ab: 6
Striated Muscle Ab: NEGATIVE

## 2017-12-31 LAB — GLUCOSE, POC
Glucose (POC): 189 mg/dL — ABNORMAL HIGH (ref 65–105)
Glucose (POC): 129 mg/dL — ABNORMAL HIGH (ref 65–105)
Glucose (POC): 167 mg/dL — ABNORMAL HIGH (ref 65–105)

## 2017-12-31 LAB — VANCOMYCIN, TROUGH: Vancomycin,trough: 22.6 ug/mL — CR (ref 15.0–20.0)

## 2017-12-31 LAB — POCT GLUCOSE
POC Glucose: 189 mg/dL — ABNORMAL HIGH (ref 65–105)
POC Glucose: 129 mg/dL — ABNORMAL HIGH (ref 65–105)
POC Glucose: 167 mg/dL — ABNORMAL HIGH (ref 65–105)

## 2017-12-31 LAB — VANCOMYCIN TROUGH: Vancomycin Tr: 22.6 ug/mL (ref 15.0–20.0)

## 2017-12-31 MED ORDER — BENZOCAINE-MENTHOL 15 MG-3.6 MG LOZENGES
Status: DC | PRN
Start: 2017-12-31 — End: 2018-01-02
  Administered 2018-01-01

## 2017-12-31 MED ORDER — PHARMACY VANCOMYCIN NOTE
Freq: Once | Status: AC
Start: 2017-12-31 — End: 2017-12-31
  Administered 2017-12-31: 17:00:00

## 2017-12-31 MED ORDER — SODIUM CHLORIDE 0.9 % IV
10 gram | Freq: Two times a day (BID) | INTRAVENOUS | Status: DC
Start: 2017-12-31 — End: 2018-01-02
  Administered 2017-12-31 – 2018-01-02 (×5): via INTRAVENOUS

## 2017-12-31 MED FILL — ZOLPIDEM 5 MG TAB: 5 mg | ORAL | Qty: 1

## 2017-12-31 MED FILL — DULOXETINE 20 MG CAP, DELAYED RELEASE: 20 mg | ORAL | Qty: 1

## 2017-12-31 MED FILL — HEPARIN, PORCINE (PF) 10 UNIT/ML IV SYRINGE: 10 unit/mL | INTRAVENOUS | Qty: 5

## 2017-12-31 MED FILL — NYSTATIN 100,000 UNIT/ML ORAL SUSP: 100000 unit/mL | ORAL | Qty: 5

## 2017-12-31 MED FILL — MAGNESIUM OXIDE 400 MG TAB: 400 mg | ORAL | Qty: 1

## 2017-12-31 MED FILL — BUDESONIDE SR 3 MG 24 HR CAP: 3 mg | ORAL | Qty: 2

## 2017-12-31 MED FILL — POTASSIUM CHLORIDE SR 20 MEQ TAB, PARTICLES/CRYSTALS: 20 mEq | ORAL | Qty: 2

## 2017-12-31 MED FILL — ACYCLOVIR 800 MG TAB: 800 mg | ORAL | Qty: 1

## 2017-12-31 MED FILL — VANCOMYCIN 10 GRAM IV SOLR: 10 gram | INTRAVENOUS | Qty: 1250

## 2017-12-31 MED FILL — CHOLECALCIFEROL (VITAMIN D3) 1,000 UNIT (25 MCG) TAB: ORAL | Qty: 5

## 2017-12-31 MED FILL — TRIMETHOPRIM-SULFAMETHOXAZOLE 160 MG-800 MG TAB: 160-800 mg | ORAL | Qty: 1

## 2017-12-31 MED FILL — PANTOPRAZOLE 40 MG TAB, DELAYED RELEASE: 40 mg | ORAL | Qty: 1

## 2017-12-31 MED FILL — TACROLIMUS 1 MG CAP: 1 mg | ORAL | Qty: 4

## 2017-12-31 MED FILL — PHARMACY VANCOMYCIN NOTE: Qty: 1

## 2017-12-31 NOTE — Progress Notes (Signed)
Progress  Notes by Kristen Loader, MD at 12/31/17 1942                Author: Kristen Loader, MD  Service: Hospitalist  Author Type: Physician       Filed: 12/31/17 1945  Date of Service: 12/31/17 1942  Status: Signed          Editor: Kristen Loader, MD (Physician)                                    Hospitalist Progress Note   Kristen Loader, MD   [facility]      Daily Progress Note: 12/31/2017        Assessment/Plan:        Possible TIA   Pt has multiple positive BCx with Staph epi.   PICC tip was positive for S epi.   Pt with TIA and faint murmur on exam.   Possiblility of septic emboli   TEE 8/20 was negative for vegetations. BE ruled out   Neuro rec ASA if not contraindicated (pt with some thrombocytopenia)      Staph epidermidis Sepsis   Immunocompromised pt   TEE 8/20 was negative for vegetations   ID is following   Pt is on Bactrim TIW and Vancomycin   BCx 8/20 NGTD, d/w ID, that if neg by tomorrow afternoon, then pt can get PICC on Friday and plan on d/c home at that point with OPAT ([place])      Diplopia with transient slurred speech, LUE weakness, gait disturbances.    Possible TIA   Work-up as above   Sx have resolved.   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   I spoke with her lead BMT oncologist and he noted to continue treatment here for infxn and once stable for discharge to d/c home so she can go to [place] on her own.   Pt is on Tasigna 266m BID      Esophagitis and diarrhea d/t GVH dz    Seems to have improved.    Pt is on Compazine PRN      Oral thrush.   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   Replete and recheck   Pt noted she gets routine IV Mag   Continue PO magOx.      Chronic pain   APAP and Oxycodone PRN      DVT Prophylaxis.   SCDs   No heparin due to plt <100      Code status. Full code.      Disposition.   S/p TEE 8/20 which was neg for vegetations   Continue abx per ID   When medically stable, will plan to d/c home and have [place] possible d/c home Friday if BCx ok and PICC  placed      ------------------      36 minutes were spent on the patient of which more than 50% was spent in coordination of care and counseling (time spent with patient/family face to face, physical exam, reviewing laboratory and imaging investigations, speaking with physicians and nursing  staff involved in this patient's care)      ------------------   Physical exam:   General:   Alert, NAD, pleasant.  Cooperative   HEENT: Sclera anicteric, PERRL, OM moist, throat clear. Oral thrush   Chest:   CTA B, no wheeze, no rales, no rhonchi.  Good air movement.   CV: RRR, S1/S2 were normal, no murmur   Abdomen: NTND, soft, NABS, no masses were noted.  No rebound, no guarding.   Extremities: No  edema.        Subjective:     Pt noted that she feels okay.  We discussed the plans for monitoring BCx and possible PICC Friday if neg.  No Cp, no f/C, no abd pain        Objective:        Visit Vitals      BP  126/65 (BP 1 Location: Left arm, BP Patient Position: Supine)     Pulse  82     Temp  98.3 ??F (36.8 ??C)     Resp  17     Ht  5' (1.524 m)     Wt  66 kg (145 lb 8.1 oz)     SpO2  98%        BMI  22.12 kg/m??         O2 Device: Room air      Temp (24hrs), Avg:98 ??F (36.7 ??C), Min:97.7 ??F (36.5 ??C), Max:98.3 ??F (36.8 ??C)     No intake/output data recorded.    08/20 0701 - 08/21 1900   In: 250 [I.V.:250]   Out: -            Data Review:          Recent Days:     Recent Labs           12/29/17   0440     WBC  4.1     HGB  9.4*     HCT   28.2*        PLT  64*          Recent Labs           12/29/17   0440     NA  140     K  5.1     CL  107     CO2  28     GLU  236*     BUN  10     CREA  0.8     CA  8.9        MG  1.4*        No results for input(s): PH, PCO2, PO2, HCO3, FIO2 in the last 72 hours.      24 Hour Results:     Recent Results (from the past 24 hour(s))     GLUCOSE, POC          Collection Time: 12/30/17  8:47 PM         Result  Value  Ref Range            Glucose (POC)  189 (H)  65 - 105 mg/dL       VANCOMYCIN,  TROUGH          Collection Time: 12/31/17  9:52 AM         Result  Value  Ref Range            Vancomycin,trough  22.6 (HH)  15.0 - 20.0 mcg/ml       GLUCOSE, POC          Collection Time: 12/31/17 10:21 AM         Result  Value  Ref Range            Glucose (POC)  129 (H)  65 - 105 mg/dL       GLUCOSE, POC          Collection Time: 12/31/17  1:07 PM         Result  Value  Ref Range            Glucose (POC)  167 (H)  65 - 105 mg/dL           Xr Chest Pa Lat      Result Date: 12/24/2017   Chest Indication: hx of GVHD with N/V/D.    Comparison: 05/20/2017 Findings: Frontal and lateral views of the chest demonstrate that the cardiac silhouette is not enlarged.  There is no focal infiltrate, pleural effusion, vascular congestion, or pneumothorax.   Left PICC with tip overlying the distal superior vena cava.       Impression: No acute cardiopulmonary disease.       Mri Brain Wo Cont      Result Date: 12/26/2017   EXAMINATION: MRI BRAIN WO CONT CLINICAL INDICATION: Diplopia; TIA bone marrow transplant. Blurred vision COMPARISON:  None. TECHNIQUE: T1 and T2-weighted sequences of the brain were obtained in multiple planes without contrast. FINDINGS: No restricted  diffusion. No masses, no midline shift. No hydrocephalus. No extra-axial fluid collections. Visualized portions of the orbits grossly unremarkable. Mastoid air cells well aerated. Visualized portions of paranasal sinuses unremarkable. Intracranial flow  voids of great vessels,  grossly intact. Diffuse low signal marrow. Age-appropriate atrophy. No susceptibility artifact on gradient echo imaging to suggest hemorrhage. Cerebellar tonsil extends into the foramen magnum 2 mm or so. Pituitary gland normal.  Orbits appear normal. No compression of optic chiasm extraocular muscles are normal in size. No orbital masses. Several foci of T2 prolongation in corona radiata and centrum semiovale. Largest in the right frontal lobe measures 8 mm and in the left parietal  lobe millimeters 10 mm. Several additional smaller lesions are noted in the frontal and parietal lobes.       IMPRESSION: 1.   Nonspecific white matter hyperintensities likely due to chronic microvascular ischemic changes. Iatrogenic drug toxicity, graft-versus-host, inflammatory/infectious, hypertension, demyelinating process, trauma, chronic headaches, sequelae  of vasculitis are other considerations. 2. Low-lying cerebellar tonsils. 3. No orbital masses. No infarction.       Mri Brain W Cont      Result Date: 12/27/2017   MRI OF THE BRAIN INDICATION: diplopia.    COMPARISON: Noncontrast MRI 12/26/2017 TECHNIQUE: Multiplanar, multisequence MRI of the brain was performed without intravenous contrast.  FINDINGS: The patient returned for contrast-enhanced imaging of the brain.  Images are degraded by patient motion artifacts. However, there is no abnormal parenchymal, dural, or meningeal enhancement. No gross sellar mass.       IMPRESSION: 1.  No abnormal enhancement to suggest active infectious/inflammatory/neoplastic process       Ct Head Wo Cont      Result Date: 12/25/2017  DICOM format image data is available to non-affiliated external healthcare facilities or entities on a secure, media free, reciprocally searchable basis with patient authorization for 12 months following the date of the study. Clinical history: Diplopia  EXAMINATION: Noncontrast CT scan of the head 12/24/2017. 4 mm axial scanning is performed from the skull base to  the vertex. Coronal and sagittal reconstruction imaging has been obtained. Correlation: None FINDINGS: Visualized paranasal sinuses and mastoid  air cells are clear. No acute intracranial hemorrhage, mass or infarction.       IMPRESSION: Normal unenhanced CT scan of the head. Initial interpretation: [facility]       Cta Head      Result Date: 12/26/2017   CTA OF THE HEAD INDICATION: Nausea, sweating, vomiting, diarrhea COMPARISON: MRI 12/26/2017, CT 12/24/2017 TECHNIQUE: Noncontrast CT the head performed followed by CTA of the head with intravenous contrast. Multiplanar two-dimensional and three-dimensional  maximum intensity projection reformats performed and reviewed. DICOM format image data is available to non-affiliated external healthcare facilities or entities on a secure, media free, reciprocally searchable basis with patient authorization for 12 months  following the date of the study. FINDINGS: Noncontrast CT of the head demonstrates no acute segmental infarct, intracranial hemorrhage, mass, midline shift, extra-axial fluid collection, or hydrocephalus. The vertebral arteries are codominant. There is  no focal hemodynamically significant stenosis within the anterior or posterior circulation. The anterior and posterior communicating arteries appear patent. There is no definitive aneurysm.       IMPRESSION: No focal hemodynamically significant stenosis.       Cta Neck      Result Date: 12/26/2017   CTA OF THE NECK WITH CONTRAST INDICATION: Speech difficulty, and balance COMPARISON: No relevant studies. TECHNIQUE: CTA of the neck was performed with nonionic intravenous contrast with multiplanar 2-dimensional and maximum intensity projection three-dimensional  images. Stenosis was evaluated using the NASCET criteria. DICOM format image data is available to non-affiliated external healthcare facilities or entities on a secure, media free, reciprocally searchable basis with patient authorization for 12 months   following the date of the study. FINDINGS: There is no hemodynamically significant stenosis within the carotid or vertebral arteries. There is no evidence of dissection. CAROTID STENOSIS REFERENCE USING NASCET CRITERIA % Stenosis = (1 - narrowest diameter/diameter  of distal artery) x100 Mild: < 50% stenosis Moderate: 50-69% stenosis. Severe: 70-94% stenosis. Near occlusion: 95-99% stenosis. Occluded: 100% stenosis       IMPRESSION: No focal hemodynamically significant stenosis.          Microbiology:     All Micro Results               Procedure  Component  Value  Units  Date/Time           CULTURE, BLOOD [[id number]]  (Abnormal)  Collected:  12/28/17 0249            Order Status:  Completed  Specimen:  Blood  Updated:  12/31/17 0721                Blood Culture Result  --                    Possible contaminant. No further workup will be performed unless directly requested to the Microbiology department at ext. [phone] with 72  hours of this report.   Gram stain Gram Positive Cocci In Clusters   Called to/read back DEREK BOZMAN 6EST   on 12/29/2017   at 0600  by VGP                    Isolate                 Coagulase Negative Staphylococcus                       CULTURE, BLOOD [[id number]]  Collected:  12/28/17 0249            Order Status:  Completed  Specimen:  Blood  Updated:  12/31/17 0706                Blood Culture Result  No Growth At 72 Hours                CULTURE, BLOOD [[id number]]  Collected:  12/30/17 1820            Order Status:  Completed  Specimen:  Blood from [facility] Picc  Updated:  12/31/17 0705               Blood Culture Result                 Culture In Progress, Daily Updates To Follow                     CULTURE, BLOOD [[id number]]  Collected:  12/30/17 1825            Order Status:  Completed  Specimen:  Blood from [facility]  Updated:  12/31/17 0705               Blood Culture Result                 Culture In Progress, Daily Updates To Follow                     CULTURE, CATHETER TIP [[id number]]   (Abnormal)  (Susceptibility)  Collected:  12/27/17 1550            Order Status:  Completed  Specimen:  Catheter Tip from PICC  Updated:  12/30/17 1106                Isolate  --                    >15 CFU   Staphylococcus epidermidis                CULTURE, BLOOD [[id number]]  Collected:  12/29/17 1830            Order Status:  Canceled  Specimen:  Blood             CULTURE, BLOOD [[id number]]  Collected:  12/29/17 1830            Order Status:  Canceled  Specimen:  Blood             CULTURE, BLOOD [[id number]]  (Abnormal)  Collected:  12/26/17 1240            Order Status:  Completed  Specimen:  Blood from [facility]  Updated:  12/28/17 0915               Blood Culture Result                 Gram stain Gram Positive Cocci In Clusters  Isolate  --                    Staphylococcus epidermidis   See Previous Culture For Susceptibility Report                CULTURE, BLOOD [[id number]]  (Abnormal)  Collected:  12/26/17 1246            Order Status:  Completed  Specimen:  Blood from PICC  Updated:  12/28/17 0913                Blood Culture Result  --                  Gram stain Gram Positive Cocci In Clusters   Called to/read back sissy   on 12/27/17   at [place]   by [id number]                   Isolate  --                    Staphylococcus epidermidis   See Previous Culture For Susceptibility Report                CULTURE, BLOOD [[id number]]  (Abnormal)  Collected:  12/24/17 2255            Order Status:  Completed  Specimen:  Blood from [facility] Picc  Updated:  12/27/17 0910                Blood Culture Result  --                  Gram stain Gram Positive Cocci In Clusters   Called to/read back jannet   on 12/25/17   at 1751   by [id number]                   Isolate  --                    See Previous Culture For Susceptibility Report   Staphylococcus epidermidis                CULTURE, BLOOD [[id number]]  (Abnormal)  (Susceptibility)  Collected:  12/24/17 2230            Order Status:  Completed  Specimen:  Blood  from [facility] Picc  Updated:  12/27/17 0749                Blood Culture Result  --                  Gram stain Gram Positive Cocci In Clusters   Called to/read back jannet   on 12/25/17   at 1751   by [id number]                  Isolate                 Staphylococcus epidermidis                       CULTURE, BLOOD FOR MRSA/SA BY PCR [[id number]]  Collected:  12/24/17 0951            Order Status:  Completed  Specimen:  Blood  Updated:  12/26/17 1133                Blood culture, MRSA by PCR  NEGATIVE  Blood culture, S. aureus by PCR  NEGATIVE              Positive culture accn number  [id number]                  Comment:  This PCR testing was performed from the blood culture accession number that is noted. Refer to   this accession number for antibiotic susceptibility testing and final identification.                        CULTURE, URINE [[id number]]  (Abnormal)  Collected:  12/24/17 2255            Order Status:  Completed  Specimen:  Urine from Clean catch  Updated:  12/26/17 1021                Culture result  --                    30,000 CFU/mL   Mixed Flora, three or more different organisms present   No Further Workup                      Cardiology:     Results for orders placed or performed during the hospital encounter of 12/24/17     EKG, 12 LEAD, INITIAL         Result  Value  Ref Range            Ventricular Rate  104  BPM       Atrial Rate  104  BPM       P-R Interval  148  ms       QRS Duration  66  ms       Q-T Interval  328  ms       QTC Calculation (Bezet)  431  ms       Calculated P Axis  18  degrees       Calculated R Axis  17  degrees       Calculated T Axis  16  degrees       Diagnosis                 Sinus tachycardia   Otherwise normal ECG   No previous ECGs available   Confirmed by Marrion Coy, M.D., Ellin Saba. (30) on 12/25/2017 7:41:35 AM                 Problem List:      Problem List  as of 12/31/2017  Date Reviewed:  01/16/18                        Codes  Class  Noted - Resolved              Bacteremia  ICD-10-CM: R78.81   ICD-9-CM: 790.7    12/26/2017 - Present                       TIA (transient ischemic attack)  ICD-10-CM: G45.9   ICD-9-CM: 435.9    12/25/2017 - Present                       Thrombocytopenia ([place])  ICD-10-CM: D69.6   ICD-9-CM: 287.5    05/13/2017 - Present  Neutropenic fever ([place])  ICD-10-CM: D70.9, R50.81   ICD-9-CM: 288.00, 780.61    05/13/2017 - Present                       Chest pressure  ICD-10-CM: R07.89   ICD-9-CM: 786.59    04/25/2017 - Present                       Elevated troponin  ICD-10-CM: R74.8   ICD-9-CM: 790.6    04/25/2017 - Present                          Medications reviewed     Current Facility-Administered Medications          Medication  Dose  Route  Frequency           ?  vancomycin (VANCOCIN) 1,250 mg in 0.9% sodium chloride 500 mL IVPB   1,250 mg  IntraVENous  Q12H     ?  benzocaine-menthol (CEPACOL) lozenge 1 Lozenge   1 Lozenge  Mucous Membrane  PRN     ?  0.9% sodium chloride infusion   83 mL/hr  IntraVENous  CONTINUOUS     ?  fentaNYL citrate (PF) injection 12.5-50 mcg   12.5-50 mcg  IntraVENous  PRN     ?  midazolam (VERSED) injection 0.25-2 mg   0.25-2 mg  IntraVENous  PRN     ?  nystatin (MYCOSTATIN) 100,000 unit/mL oral suspension 500,000 Units   500,000 Units  Oral  QID     ?  sodium chloride (NS) flush 10-20 mL   10-20 mL  InterCATHeter  PRN     ?  heparin (porcine) pf 50 Units   50 Units  InterCATHeter  PRN     ?  DULoxetine (CYMBALTA) capsule 20 mg   20 mg  Oral  DAILY     ?  magnesium oxide (MAG-OX) tablet 400 mg   400 mg  Oral  BID     ?  pantoprazole (PROTONIX) tablet 40 mg   40 mg  Oral  DAILY     ?  acyclovir (ZOVIRAX) tablet 400 mg   400 mg  Oral  BID     ?  cholecalciferol (VITAMIN D3) tablet 5,000 Units   5,000 Units  Oral  DAILY     ?  dextrose (D50) infusion 5-25 g   10-50 mL  IntraVENous  PRN     ?  glucagon (GLUCAGEN) injection 1 mg   1 mg  IntraMUSCular  PRN     ?  insulin glargine (LANTUS) injection 1-100 Units    1-100 Units  SubCUTAneous  QHS     ?  insulin lispro (HUMALOG) injection 1-100 Units   1-100 Units  SubCUTAneous  AC&HS     ?  insulin lispro (HUMALOG) injection 1-100 Units   1-100 Units  SubCUTAneous  PRN     ?  oxyCODONE IR (ROXICODONE) tablet 10 mg   10 mg  Oral  Q4H PRN     ?  potassium chloride (K-DUR, KLOR-CON) SR tablet 40 mEq   40 mEq  Oral  BID     ?  tacrolimus (PROGRAF) capsule 4 mg   4 mg  Oral  BID     ?  prochlorperazine (COMPAZINE) tablet 10 mg   10 mg  Oral  Q4H PRN     ?  zolpidem (AMBIEN) tablet 5 mg  5 mg  Oral  QHS PRN     ?  trimethoprim-sulfamethoxazole (BACTRIM DS, SEPTRA DS) 160-800 mg per tablet 1 Tab   1 Tab  Oral  Q MON, WED & FRI     ?  naloxone (NARCAN) injection 0.1 mg   0.1 mg  IntraVENous  PRN     ?  acetaminophen (TYLENOL) tablet 650 mg   650 mg  Oral  Q4H PRN          Or           ?  acetaminophen (TYLENOL) solution 650 mg   650 mg  Oral  Q4H PRN          Or           ?  acetaminophen (TYLENOL) suppository 650 mg   650 mg  Rectal  Q4H PRN           ?  nilotinib (TASIGNA) capsule cap 200 mg  (Patient Supplied)   200 mg  Oral  BID     ?  LORazepam (ATIVAN) tablet 1 mg   1 mg  Oral  ON CALL     ?  budesonide (ENTOCORT EC) capsule 6 mg   6 mg  Oral  TID           ?  *Pharmacy dosing vancomycin   1 Each  Other  Rx Dosing/Monitoring              Kristen Loader, MD

## 2017-12-31 NOTE — Progress Notes (Signed)
Neurology Note    History [name] Wu presents with the history of having left side paresthesias and speech difficulties which resolved, started on last Monday. She is also complaining of blurred and double vision.       Neurological Examination:    Mental status: Patient is alert and oriented times three, speech and language intact, repetition intact,Fund of knowledge and cognition intact.     Cranial Nerves:   I.   Not tested.  II.   Visual fields are full to confrontation, could not check fundus  III, IV, VI.     Both pupils equal and reactive, full conjugate movements, no nystagmus.  V.    Sensation on the face symmetrical and normal.  VII.                  No facial weakness.  VIII.   Hearing normal to finger rub bilaterally.  IX, X.   Voice and articulation are normal, palate movement symmetrical  XI.   Bilateral shoulder shrug strength normal.  XII.   Tongue normal in bulk and strength, no fasciculations    Motor:  Strength in bilateral upper extremities is 5/5. Strength in bilateral lower extremities is 5/5.  Tone and bulk normal. Gait normal.   Co-ordination:  Finger to nose normal. No dysmetria.  Sensory examination: Sensation to pin prick and temperature is bilaterally symmetrical and normal.   Deep tendon reflexes: DTRs are bilaterally 0. Bilateral toes downgoing.    IMPRESSION [name] Wu presents with the history of having left sided paresthesias and speech difficulties which resolved, and could be TIA. She does complain of double vision, which gets better closing one eye. It is possible that she might have small ischemia in 6th or 4th cranial nerve, seen commonly with diabetes.     There is full conjugate movement on examination at this time. There is no ptosis or any other focal weakness at this time. MRI brain showed small vessel changes but no acute changes. She just had LP done 3-4 days before her symptoms in [place] which did not show any malignant cells as per the patient.     Sometimes prograf can  also cause neurotoxicity, which can present with diplopia. Hence MRI brain with contrast was repeated which was unremarkable. The patient today feels that her diplopia has improved and is feeling better.     She continues to be stable from neuro standpoint and her vision remains stable.     PLAN  - continue mx of stroke risk factors  - could consider aspirin if not contraindicated  - cont mx of stroke risk factors  - can f.up with her oncologist as outpt  - will sign off, please call if needed      Medications:    Current Facility-Administered Medications:   ???  Vancomycin Trough Level Due, , Other, ONCE, Khanna, Anuj, MD  ???  0.9% sodium chloride infusion, 83 mL/hr, IntraVENous, CONTINUOUS, Kristen Loader, MD  ???  fentaNYL citrate (PF) injection 12.5-50 mcg, 12.5-50 mcg, IntraVENous, PRN, Maurice Small, MD, 25 mcg at 12/30/17 1412  ???  lidocaine (XYLOCAINE) 2 % viscous solution 15 mL, 15 mL, Mouth/Throat, PRN, Maurice Small, MD, 15 mL at 12/30/17 1404  ???  midazolam (VERSED) injection 0.25-2 mg, 0.25-2 mg, IntraVENous, PRN, Maurice Small, MD, 1 mg at 12/30/17 1412  ???  nystatin (MYCOSTATIN) 100,000 unit/mL oral suspension 500,000 Units, 500,000 Units, Oral, QID, Kristen Loader, MD, 500,000 Units at 12/30/17 2114  ???  vancomycin (VANCOCIN) 1,250 mg in 0.9% sodium chloride 250 mL IVPB, 1,250 mg, IntraVENous, Q12H, Riles, Jr. Francene Boyers, MD, Last Rate: 166.7 mL/hr at 12/31/17 0205, 1,250 mg at 12/31/17 0205  ???  sodium chloride (NS) flush 10 mL, 10 mL, InterCATHeter, DAILY, Josie Saunders, Anuj, MD, Stopped at 12/29/17 0900  ???  sodium chloride (NS) flush 10-20 mL, 10-20 mL, InterCATHeter, PRN, Josie Saunders, Anuj, MD  ???  heparin (porcine) pf 50 Units, 50 Units, InterCATHeter, PRN, Josie Saunders, Anuj, MD  ???  heparin (porcine) pf 50 Units, 50 Units, InterCATHeter, DAILY, Sundra Aland, MD, Stopped at 12/28/17 0900  ???  DULoxetine (CYMBALTA) capsule 20 mg, 20 mg, Oral, DAILY, Josie Saunders, Anuj, MD, 20 mg at 12/30/17 1617  ???  magnesium oxide (MAG-OX) tablet 400 mg,  400 mg, Oral, BID, Sundra Aland, MD, 400 mg at 12/30/17 2113  ???  pantoprazole (PROTONIX) tablet 40 mg, 40 mg, Oral, DAILY, Sundra Aland, MD, 40 mg at 12/30/17 1618  ???  acyclovir (ZOVIRAX) tablet 400 mg, 400 mg, Oral, BID, Einar Grad, MD, 400 mg at 12/30/17 2114  ???  cholecalciferol (VITAMIN D3) tablet 5,000 Units, 5,000 Units, Oral, DAILY, Einar Grad, MD, 5,000 Units at 12/30/17 1618  ???  dextrose (D50) infusion 5-25 g, 10-50 mL, IntraVENous, PRN, Einar Grad, MD  ???  glucagon (GLUCAGEN) injection 1 mg, 1 mg, IntraMUSCular, PRN, Einar Grad, MD  ???  insulin glargine (LANTUS) injection 1-100 Units, 1-100 Units, SubCUTAneous, QHS, Einar Grad, MD, 30 Units at 12/30/17 2211  ???  insulin lispro (HUMALOG) injection 1-100 Units, 1-100 Units, SubCUTAneous, AC&HS, Einar Grad, MD, 2 Units at 12/30/17 2212  ???  insulin lispro (HUMALOG) injection 1-100 Units, 1-100 Units, SubCUTAneous, PRN, Einar Grad, MD, 2 Units at 12/26/17 2203  ???  oxyCODONE IR (ROXICODONE) tablet 10 mg, 10 mg, Oral, Q4H PRN, Einar Grad, MD, 10 mg at 12/26/17 1506  ???  potassium chloride (K-DUR, KLOR-CON) SR tablet 40 mEq, 40 mEq, Oral, BID, Einar Grad, MD, 40 mEq at 12/30/17 2113  ???  tacrolimus (PROGRAF) capsule 4 mg, 4 mg, Oral, BID, Einar Grad, MD, 4 mg at 12/30/17 2113  ???  prochlorperazine (COMPAZINE) tablet 10 mg, 10 mg, Oral, Q4H PRN, Einar Grad, MD, 10 mg at 12/25/17 1228  ???  zolpidem (AMBIEN) tablet 5 mg, 5 mg, Oral, QHS PRN, Einar Grad, MD, 5 mg at 12/31/17 0205  ???  trimethoprim-sulfamethoxazole (BACTRIM DS, SEPTRA DS) 160-800 mg per tablet 1 Tab, 1 Tab, Oral, Q MON, WED & Gara Kroner, MD, 1 Tab at 12/29/17 1149  ???  naloxone (NARCAN) injection 0.1 mg, 0.1 mg, IntraVENous, PRN, Einar Grad, MD  ???  acetaminophen (TYLENOL) tablet 650 mg, 650 mg, Oral, Q4H PRN **OR** acetaminophen (TYLENOL) solution 650 mg, 650 mg, Oral, Q4H PRN **OR** acetaminophen (TYLENOL) suppository 650 mg, 650 mg, Rectal, Q4H PRN, Einar Grad, MD  ???   nilotinib (TASIGNA) capsule cap 200 mg  (Patient Supplied), 200 mg, Oral, BID, Einar Grad, MD, 200 mg at 12/30/17 2119  ???  LORazepam (ATIVAN) tablet 1 mg, 1 mg, Oral, ON CALL, Einar Grad, MD  ???  budesonide (ENTOCORT EC) capsule 6 mg, 6 mg, Oral, TID, Dawson Bills, Muhamed, MD, 6 mg at 12/30/17 2113  ???  *Pharmacy dosing vancomycin, 1 Each, Other, Rx Dosing/Monitoring, Einar Grad, MD    Vitals  Patient Vitals for the past 24 hrs:   Temp Pulse Resp BP SpO2   12/31/17 0754 97.8 ??F (36.6 ??C) 76 17 104/69 100 %   12/31/17 0525  98 ??F (36.7 ??C) 99 16 126/70 100 %   12/30/17 2328 97.7 ??F (36.5 ??C) 88 16 121/74 100 %   12/30/17 1926 98.7 ??F (37.1 ??C) 94 18 125/74 98 %   12/30/17 1553 97.9 ??F (36.6 ??C) 67 17 123/64 100 %   12/30/17 1502 ??? 70 17 131/78 100 %   12/30/17 1447 ??? 72 17 135/81 ???   12/30/17 1433 ??? 79 18 142/77 100 %   12/30/17 1427 ??? 79 18 137/80 100 %   12/30/17 1422 ??? 95 16 (!) 154/101 100 %   12/30/17 1417 ??? (!) 102 12 (!) 148/91 100 %   12/30/17 1412 ??? (!) 126 (!) 33 (!) 172/121 99 %   12/30/17 1407 ??? 73 18 150/79 100 %   12/30/17 1403 ??? 77 18 (!) 159/94 100 %   12/30/17 1107 97.3 ??F (36.3 ??C) 67 18 126/80 100 %         Imaging  No results found.   Echo Results  (Last 48 hours)               12/30/17 1647  ECHO TRANSESOPHAGEAL (TEE) W OR WO CONTR Final result    Narrative:                                                                Study ID:    [id number]                                                   [facility]                                         [address]                                                 [place], [place]                                Transesophageal Echocardiogram Report  Name: [name] Wu, [name] Wu     Study Date: 12/30/2017 01:56 PM   MRN: [id number]              Patient Location: [id number]   DOB:  March 31, 1961           Age: 57 yrs   Height: 68 in             Weight: 148 lb                                                                      BSA: 1.8    m2   BP: 123/64 mmHg           HR: 120   Gender: Female            Account #: [id number]   Reason For Study: TIA, Staph, Rule out BR   History: DM, HTN, Leukemia   Ordering Physician: Kathyrn Lass   Performed By: Tally Joe, RDCS       Interpretation Summary   A Transesophageal echocardiogram with 2-D, Colorflow Doppler, PW/CW Doppler   and Saline Bubble Study was performed.   The study was technically adequate.   Transesophageal echocardiogram was performed by Dr. Truman Hayward.   Summary:       1. Normal LV systolic function with an EF of 55%.   2. No valvular vegetations noted.   3. Mild aortic and pulmonic insufficiency. No hemodynamically significant   valvular abnormalities.   4. No left atrial appendage thrombus.   5. Negative bubble study - no evidence of interatrial shunting.   6. Patient tolerated the procedure well. No complications.   _____________________________________________________________________________   __           Left Ventricle   The left ventricle is normal in size. There is normal left ventricular wall   thickness. Left ventricular systolic function is normal. The estimated left   ventricular ejection fraction is 55%.       Right Ventricle   The right ventricle is normal size. There is normal right ventricular wall   thickness. The right ventricular systolic function is normal.       Atria   Saline Contrast was used which demonstrated no shunting across the atrial   septum. No thrombus is detected in the left atrial appendage.           Mitral Valve   Normal in structure. There is no evidence of mitral valve prolapse. There is   no vegetation seen on the mitral valve. There is no mitral valve stenosis.   There is trivial mitral regurgitation.       Tricuspid Valve   The tricuspid valve is normal. There is no tricuspid valve prolapse. There  is   no tricuspid stenosis. There is mild tricuspid regurgitation.       Aortic Valve   The aortic valve is normal in structure and function. There is no aortic   valvular vegetation. No aortic stenosis. Mild aortic regurgitation.       Pulmonic Valve   The pulmonic valve leaflets are thin and pliable; valve motion is normal.   There is no vegetation on the pulmonic valve. There is no pulmonic valvular   stenosis. Mild pulmonic valvular regurgitation.  Vessels   The aortic root is normal size. Mild atherosclerosis noted in aorta.           Pericardium   There is no pericardial effusion.       Procedure:   The patient was brought to the holding area in a fasting state. Both the   benefits and risks of the procedure were explained in detail to the patient.   The patient understands the risks of major complications. Informed consent    was   obtained from the patient and appropriate timeout was observed. After topical   anesthesia of the oropharynx was acheived, moderate sedation was    administered.   The echoscope removed atraumatically. The Omniplane echoscope was easily   introduced into the mouth and esophagus with echo and Doppler imaging   performed without difficulty. The patient observed post sedation with stable   cardiopulmonary and neurologic status and received orders to resume diet and   medications. Pre-Procedure BP: 159/94. Post-Procedure BP: 154/101.       Medications:   The oropharynx was locally anesthetized with viscous lidocaine and/or   benzocaine spray. Moderate sedation was ordered by Cardiologist, Dr. Truman Hayward.   Moderate sedation was started at 14:02. Procedure ended at 14:23. Patient   received 2 mg of Versed and 27mcg of Fentanyl. for sedation.       Complications   The patient tolerated the procedure well; there were no complications.       MEASUREMENTS/CALCULATIONS:               Electronically signed byDr Maurice Small, MD   12/30/2017 04:47 PM               Labs  No results found for this  visit on 12/24/17 (from the past 12 hour(s)).  Lab Results   Component Value Date/Time    Cholesterol, total 174 12/26/2017 04:40 AM    HDL Cholesterol 57 12/26/2017 04:40 AM    LDL, calculated 88 12/26/2017 04:40 AM    Triglyceride 145 12/26/2017 04:40 AM    CHOL/HDL Ratio 3.1 12/26/2017 04:40 AM           Delora Fuel, MD

## 2017-12-31 NOTE — Progress Notes (Signed)
Progress  Notes by Jearld Adjutant, MD at 12/31/17 1696                Author: Jearld Adjutant, MD  Service: Infectious Disease  Author Type: Physician       Filed: 12/31/17 1834  Date of Service: 12/31/17 0722  Status: Addendum          Editor: Jearld Adjutant, MD (Physician)          Related Notes: Original Note by Jearld Adjutant, MD (Physician) filed at 12/31/17 1829                            Infectious Disease Follow-up       Admit Date: 12/24/2017      Today's Date: 12/31/17         Current abx  Prior abx        Prophylactic Bactrim tiw, ACV; vancomycin  8/16-5  meropenem 8/16-1          Assessment:          -MRSE BSI- CA       picc infection - + catheter tip       TEE neg for IE-- appreciate cardiology assist    bl cx 8/20 remain ngtd   -TTE no reported vegetation, bubble study negative      AML sp chemo/stem cell xplant   -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[place], last ~1 mo ago per Tan's note --[phone]   -on tassigra comp GVH skin, esophagus    -bactrim tiw, acyclovir prophylaxis   -pancytopenia ch thrombocytopenia, not neutropenic (ANC 8/15 1800)      L numbness, weakness n/v vision change diplopia began Monday 8/12- CTH neg    resolved        GVHD- esophagitis/ diarrhea      improved      thrush - improved     DM on insulin        HTN        PCN allergy             Rec:     ->continue iv vancomycin   ->monitor picc tip from 8/17 - positive for MRSE   ->monitor repeat bctx's from 8/18- now 1 of 2 + for MRSE   -> TEE 8/20 neg      ->  Repeat bl cx x 2  From 8/20  to confirm clearance of bacteremia.         If 8/20 bl cx neg after 2-3 days may replace picc -- pt says need iv access 2x/week;         If remain neg@48  h, ok to place picc      Will write OPAT:   ALL sp chemo / Stem cell tx   MRSE CA BSI   Routine picc care per protocol   Vanco dosing per pharmacy- vanco trough goal between 15-20   vanco through sept 3   Monday labs: cbc/diff, bpm fax to  [phone]   Fu with ID 1 week post dc - will arrange              -> reviewed repeat MRI - neg       defer w/u for diplopia to Neurology, appears holding on LP for now, pt says better this am    Discussed plan with Dr Nira Retort and pt- at length.  I let ID office know to schedule her for fu appt     MICROBIOLOGY:     8/14 bctx x 2 MRSE    uctx ngtd   8/16 bctx x 2 S epi see prior   8/17     Cath tip > S epi   8/18 bctx x 2 ip- 1 of 2  G+ cocci        LINES AND CATHETERS:     PICC- dc'ed  8/17   piv intact        Active Hospital Problems           Diagnosis  Date Noted         ?  Bacteremia  12/26/2017         ?  TIA (transient ischemic attack)  12/25/2017                Subjective:        Interval notes reviewed. Pt says feeling better, stinging with vanco infusion.  PT many questions today-answered and discussed plan as outlined above.  Mouth bettter        Current Facility-Administered Medications          Medication  Dose  Route  Frequency           ?  Vancomycin Trough Level Due     Other  ONCE     ?  0.9% sodium chloride infusion   83 mL/hr  IntraVENous  CONTINUOUS     ?  fentaNYL citrate (PF) injection 12.5-50 mcg   12.5-50 mcg  IntraVENous  PRN     ?  lidocaine (XYLOCAINE) 2 % viscous solution 15 mL   15 mL  Mouth/Throat  PRN     ?  midazolam (VERSED) injection 0.25-2 mg   0.25-2 mg  IntraVENous  PRN     ?  nystatin (MYCOSTATIN) 100,000 unit/mL oral suspension 500,000 Units   500,000 Units  Oral  QID           ?  vancomycin (VANCOCIN) 1,250 mg in 0.9% sodium chloride 250 mL IVPB   1,250 mg  IntraVENous  Q12H           ?  sodium chloride (NS) flush 10 mL   10 mL  InterCATHeter  DAILY     ?  sodium chloride (NS) flush 10-20 mL   10-20 mL  InterCATHeter  PRN     ?  heparin (porcine) pf 50 Units   50 Units  InterCATHeter  PRN     ?  heparin (porcine) pf 50 Units   50 Units  InterCATHeter  DAILY     ?  DULoxetine (CYMBALTA) capsule 20 mg   20 mg  Oral  DAILY     ?  magnesium oxide (MAG-OX) tablet 400 mg   400 mg  Oral  BID      ?  pantoprazole (PROTONIX) tablet 40 mg   40 mg  Oral  DAILY     ?  acyclovir (ZOVIRAX) tablet 400 mg   400 mg  Oral  BID     ?  cholecalciferol (VITAMIN D3) tablet 5,000 Units   5,000 Units  Oral  DAILY     ?  dextrose (D50) infusion 5-25 g   10-50 mL  IntraVENous  PRN     ?  glucagon (GLUCAGEN) injection 1 mg   1 mg  IntraMUSCular  PRN     ?  insulin glargine (LANTUS) injection 1-100  Units   1-100 Units  SubCUTAneous  QHS     ?  insulin lispro (HUMALOG) injection 1-100 Units   1-100 Units  SubCUTAneous  AC&HS     ?  insulin lispro (HUMALOG) injection 1-100 Units   1-100 Units  SubCUTAneous  PRN     ?  oxyCODONE IR (ROXICODONE) tablet 10 mg   10 mg  Oral  Q4H PRN     ?  potassium chloride (K-DUR, KLOR-CON) SR tablet 40 mEq   40 mEq  Oral  BID     ?  tacrolimus (PROGRAF) capsule 4 mg   4 mg  Oral  BID     ?  prochlorperazine (COMPAZINE) tablet 10 mg   10 mg  Oral  Q4H PRN     ?  zolpidem (AMBIEN) tablet 5 mg   5 mg  Oral  QHS PRN           ?  trimethoprim-sulfamethoxazole (BACTRIM DS, SEPTRA DS) 160-800 mg per tablet 1 Tab   1 Tab  Oral  Q MON, WED & FRI           ?  naloxone (NARCAN) injection 0.1 mg   0.1 mg  IntraVENous  PRN     ?  acetaminophen (TYLENOL) tablet 650 mg   650 mg  Oral  Q4H PRN          Or           ?  acetaminophen (TYLENOL) solution 650 mg   650 mg  Oral  Q4H PRN          Or           ?  acetaminophen (TYLENOL) suppository 650 mg   650 mg  Rectal  Q4H PRN     ?  nilotinib (TASIGNA) capsule cap 200 mg  (Patient Supplied)   200 mg  Oral  BID     ?  LORazepam (ATIVAN) tablet 1 mg   1 mg  Oral  ON CALL     ?  budesonide (ENTOCORT EC) capsule 6 mg   6 mg  Oral  TID           ?  *Pharmacy dosing vancomycin   1 Each  Other  Rx Dosing/Monitoring                Objective:        Visit Vitals      BP  126/70 (BP 1 Location: Left arm, BP Patient Position: Supine)     Pulse  99     Temp  98 ??F (36.7 ??C)     Resp  16     Ht  5' (1.524 m)     Wt  66 kg (145 lb 8.1 oz)     SpO2  100%        BMI  22.12 kg/m??            Temp (24hrs), Avg:97.9 ??F (36.6 ??C), Min:97.3 ??F (36.3 ??C), Max:98.7 ??F (37.1 ??C)      GEN: WFWN BF appears stated age lying in bed NAD    HEENT: anicteric sclerae moist orophyarynx   NECK: Supple    CHEST: no rales rhonchi or wheeze   CVS: S1'S2' no rub murmur or gallop   ABD: soft non-tender (+) BS no masses non-distended   EXT: no edema LUE picc out          Labs:  Results:  Chemistry  Recent Labs         12/29/17   0440  12/28/17   1201      GLU  236*   --       NA  140   --       K  5.1   --       CL  107   --       CO2  28   --       BUN  10   --       CREA  0.8  0.8      CA  8.9   --       AGAP  5   --                  CBC w/Diff  Recent Labs         12/29/17   0440      WBC  4.1      RBC  2.77*      HGB  9.4*      HCT  28.2*      PLT  64*      GRANS  77.7*      LYMPH  12.6*                 Mri brain 8/16 IMPRESSION:   1.   Nonspecific white matter hyperintensities likely due to chronic   microvascular ischemic changes. 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   2. Low-lying cerebellar tonsils.   3. No orbital masses. No infarction      8/17 mri IMPRESSION:   ??   1.  No abnormal enhancement to suggest active infectious/inflammatory/neoplastic   process      Echo 8/16 Interpretation Summary  A complete two-dimensional transthoracic echocardiogram was performed (2D, M-  mode, Doppler and color flow Doppler).  The study was technically adequate.    Small left ventricle, norla wall thickness  with normal systolic function and   a  calculated ejectio fraction of 77%.  No regional wall motion abnormalites are noted.  Grade 1 diastolic dysfunction.  Normal size right ventricle, normal systolic function.  No hemodynamic  valvular pathology or signficiant regurgitation.  Estimated right ventricular systolic pressure: [id number].  Negative bubble study for  interatrial shunting.         35 min spent with > 50% spent on counseling and coordination of care      Collene Mares, MD   December 31, 2017   [facility]   [phone]

## 2017-12-31 NOTE — Progress Notes (Signed)
2020- Attempted to give patient her 2100 meds. Patient stated that she is normally given the meds between 2130-2200 and does not want them now. Advised patient I will come back then.    2135- Attempted again for patient to take night meds and she said she is not taking them right now. She is SOB and needs a breathing treatment. No orders for inhalers or neb treatments on file. Asked patient what she takes at home because there are no PTA inhalers listed. Patient stated she has asthma and takes albuterol. Advised I will call the doctor to see if I can obtain an order    2200- Order placed for albuterol 2.5 nebulizer q4 hrs prn. Resp therapist called Danise Mina) to administer tx for patient.    Advised patient that an order has been placed for albuterol neb treatments and respiratory was notified.   Patients hs blood sugar had been obtained and tried to give her humalog and lantus. Patient said she is not taking anything right. She needs a breathing treatment. Advised patient that respiratory had been notified, however insulin administration is time sensitive. Advised that if we wait we will have to check your blood sugar again. Patient yelled she doesn't care and she is not taking anything. She needs her breathing treatment. Patient was in no acute distress, Lungs were clear and she was 100% on room air. Patient has a dry cough. Asked if it was productive and patient stated she did not know. She did not want to take her 2100 or 2200 meds as well for the third attempt.

## 2017-12-31 NOTE — Progress Notes (Signed)
Pt in [place] 6 yof, dx TIA/Bacteremia. Attend [facility]. Pt is requesting albuterol treatments. States she has asthma and experiencing SOB. Doran Stabler [phone].    Lungs are clearm, 100% O2 on room air     Order albuterol 2.5 nebulizer q4prn

## 2017-12-31 NOTE — Progress Notes (Signed)
[facility] Dosing Services: Vancomycin    Trough was drawn 4 hours early around 0900 and resulted as 22.6. Actual trough would have been roughly 16.8, which is in goal. Will maintain patient on vancomycin 1250 mg q12h. However, at the request of Dr. Glynda Jaeger, we will be making vancomycin 1250 mg in 500 mL of 0.9% NS and not 250 mL due to patient experiencing burning sensation. Pharmacy will continue to monitor and observe clinical situation. Thanks!    Maralyn Sago, PharmD

## 2017-12-31 NOTE — Progress Notes (Signed)
Progressing towards goals

## 2017-12-31 NOTE — Progress Notes (Signed)
Messaged Pharmacy:  IV Vanc admin time needs to be adjusted. Previous admin was delayed d/t PIV infiltration. Was administered at [place]. New order for trough being entered for 1300

## 2017-12-31 NOTE — Progress Notes (Signed)
Problem: Gas Exchange - Impaired  Goal: *Absence of hypoxia  Outcome: Progressing Towards Goal

## 2017-12-31 NOTE — Progress Notes (Signed)
[facility] ([facility]   CARDIOLOGY PROGRESS NOTE    POD # 1, TEE for bacteremia - no vegetations    S: c/o "scratchy throat, denies dysphagia/odynophagia  O: VSS, cor-RRR, lungs clear, no JVD or edema, Abd - benign  A: no evidence of SBE  P: prn Cepacol lozenges      d/w Dr. Hedy Jacob C. Herma Mering, MD, [place], [place], [facility]  Phone:  [phone]    12/31/2017  4:14 PM

## 2017-12-31 NOTE — Progress Notes (Signed)
Problem: Gas Exchange - Impaired  Goal: *Absence of hypoxia  Outcome: Progressing Towards Goal

## 2017-12-31 NOTE — Progress Notes (Signed)
Neurology Note    History [name] Wu presents with the history of having left side paresthesias and speech difficulties which resolved, started on last Monday. She is also complaining of blurred and double vision.       Neurological Examination:    Mental status: Patient is alert and oriented times three, speech and language intact, repetition intact,Fund of knowledge and cognition intact.     Cranial Nerves:   I.   Not tested.  II.   Visual fields are full to confrontation, could not check fundus  III, IV, VI.     Both pupils equal and reactive, full conjugate movements, no nystagmus.  V.    Sensation on the face symmetrical and normal.  VII.                  No facial weakness.  VIII.   Hearing normal to finger rub bilaterally.  IX, X.   Voice and articulation are normal, palate movement symmetrical  XI.   Bilateral shoulder shrug strength normal.  XII.   Tongue normal in bulk and strength, no fasciculations    Motor:  Strength in bilateral upper extremities is 5/5. Strength in bilateral lower extremities is 5/5.  Tone and bulk normal. Gait normal.   Co-ordination:  Finger to nose normal. No dysmetria.  Sensory examination: Sensation to pin prick and temperature is bilaterally symmetrical and normal.   Deep tendon reflexes: DTRs are bilaterally 0. Bilateral toes downgoing.    IMPRESSION [name] Wu presents with the history of having left sided paresthesias and speech difficulties which resolved, and could be TIA. She does complain of double vision, which gets better closing one eye. It is possible that she might have small ischemia in 6th or 4th cranial nerve, seen commonly with diabetes.     There is full conjugate movement on examination at this time. There is no ptosis or any other focal weakness at this time. MRI brain showed small vessel changes but no acute changes. She just had LP done 3-4 days before her symptoms in [place] which did not show any malignant cells as per the patient.      Sometimes prograf can also cause neurotoxicity, which can present with diplopia. Hence MRI brain with contrast was repeated which was unremarkable. The patient today feels that her diplopia has improved and is feeling better.     She continues to be stable from neuro standpoint and her vision remains stable.     PLAN  - continue mx of stroke risk factors  - could consider aspirin if not contraindicated  - cont mx of stroke risk factors  - can f.up with her oncologist as outpt  - will sign off, please call if needed      Medications:    Current Facility-Administered Medications:   ???  Vancomycin Trough Level Due, , Other, ONCE, Khanna, Anuj, MD  ???  0.9% sodium chloride infusion, 83 mL/hr, IntraVENous, CONTINUOUS, Kristen Loader, MD  ???  fentaNYL citrate (PF) injection 12.5-50 mcg, 12.5-50 mcg, IntraVENous, PRN, Maurice Small, MD, 25 mcg at 12/30/17 1412  ???  lidocaine (XYLOCAINE) 2 % viscous solution 15 mL, 15 mL, Mouth/Throat, PRN, Maurice Small, MD, 15 mL at 12/30/17 1404  ???  midazolam (VERSED) injection 0.25-2 mg, 0.25-2 mg, IntraVENous, PRN, Maurice Small, MD, 1 mg at 12/30/17 1412  ???  nystatin (MYCOSTATIN) 100,000 unit/mL oral suspension 500,000 Units, 500,000 Units, Oral, QID, Kristen Loader, MD, 500,000 Units at 12/30/17 2114  ???  vancomycin (VANCOCIN) 1,250 mg in 0.9% sodium chloride 250 mL IVPB, 1,250 mg, IntraVENous, Q12H, Riles, Jr. Francene Boyers, MD, Last Rate: 166.7 mL/hr at 12/31/17 0205, 1,250 mg at 12/31/17 0205  ???  sodium chloride (NS) flush 10 mL, 10 mL, InterCATHeter, DAILY, Josie Saunders, Anuj, MD, Stopped at 12/29/17 0900  ???  sodium chloride (NS) flush 10-20 mL, 10-20 mL, InterCATHeter, PRN, Josie Saunders, Anuj, MD  ???  heparin (porcine) pf 50 Units, 50 Units, InterCATHeter, PRN, Josie Saunders, Anuj, MD  ???  heparin (porcine) pf 50 Units, 50 Units, InterCATHeter, DAILY, Sundra Aland, MD, Stopped at 12/28/17 0900  ???  DULoxetine (CYMBALTA) capsule 20 mg, 20 mg, Oral, DAILY, Josie Saunders, Anuj, MD, 20 mg at 12/30/17 1617   ???  magnesium oxide (MAG-OX) tablet 400 mg, 400 mg, Oral, BID, Sundra Aland, MD, 400 mg at 12/30/17 2113  ???  pantoprazole (PROTONIX) tablet 40 mg, 40 mg, Oral, DAILY, Sundra Aland, MD, 40 mg at 12/30/17 1618  ???  acyclovir (ZOVIRAX) tablet 400 mg, 400 mg, Oral, BID, Einar Grad, MD, 400 mg at 12/30/17 2114  ???  cholecalciferol (VITAMIN D3) tablet 5,000 Units, 5,000 Units, Oral, DAILY, Einar Grad, MD, 5,000 Units at 12/30/17 1618  ???  dextrose (D50) infusion 5-25 g, 10-50 mL, IntraVENous, PRN, Einar Grad, MD  ???  glucagon (GLUCAGEN) injection 1 mg, 1 mg, IntraMUSCular, PRN, Einar Grad, MD  ???  insulin glargine (LANTUS) injection 1-100 Units, 1-100 Units, SubCUTAneous, QHS, Einar Grad, MD, 30 Units at 12/30/17 2211  ???  insulin lispro (HUMALOG) injection 1-100 Units, 1-100 Units, SubCUTAneous, AC&HS, Einar Grad, MD, 2 Units at 12/30/17 2212  ???  insulin lispro (HUMALOG) injection 1-100 Units, 1-100 Units, SubCUTAneous, PRN, Einar Grad, MD, 2 Units at 12/26/17 2203  ???  oxyCODONE IR (ROXICODONE) tablet 10 mg, 10 mg, Oral, Q4H PRN, Einar Grad, MD, 10 mg at 12/26/17 1506  ???  potassium chloride (K-DUR, KLOR-CON) SR tablet 40 mEq, 40 mEq, Oral, BID, Einar Grad, MD, 40 mEq at 12/30/17 2113  ???  tacrolimus (PROGRAF) capsule 4 mg, 4 mg, Oral, BID, Einar Grad, MD, 4 mg at 12/30/17 2113  ???  prochlorperazine (COMPAZINE) tablet 10 mg, 10 mg, Oral, Q4H PRN, Einar Grad, MD, 10 mg at 12/25/17 1228  ???  zolpidem (AMBIEN) tablet 5 mg, 5 mg, Oral, QHS PRN, Einar Grad, MD, 5 mg at 12/31/17 0205  ???  trimethoprim-sulfamethoxazole (BACTRIM DS, SEPTRA DS) 160-800 mg per tablet 1 Tab, 1 Tab, Oral, Q MON, WED & Gara Kroner, MD, 1 Tab at 12/29/17 1149  ???  naloxone (NARCAN) injection 0.1 mg, 0.1 mg, IntraVENous, PRN, Einar Grad, MD  ???  acetaminophen (TYLENOL) tablet 650 mg, 650 mg, Oral, Q4H PRN **OR** acetaminophen (TYLENOL) solution 650 mg, 650 mg, Oral, Q4H PRN **OR**  acetaminophen (TYLENOL) suppository 650 mg, 650 mg, Rectal, Q4H PRN, Einar Grad, MD  ???  nilotinib (TASIGNA) capsule cap 200 mg  (Patient Supplied), 200 mg, Oral, BID, Einar Grad, MD, 200 mg at 12/30/17 2119  ???  LORazepam (ATIVAN) tablet 1 mg, 1 mg, Oral, ON CALL, Einar Grad, MD  ???  budesonide (ENTOCORT EC) capsule 6 mg, 6 mg, Oral, TID, Dawson Bills, Muhamed, MD, 6 mg at 12/30/17 2113  ???  *Pharmacy dosing vancomycin, 1 Each, Other, Rx Dosing/Monitoring, Einar Grad, MD    Vitals  Patient Vitals for the past 24 hrs:   Temp Pulse Resp BP SpO2   12/31/17 0754 97.8 ??F (36.6 ??C) 76 17 104/69 100 %   12/31/17 0525  98 ??F (36.7 ??C) 99 16 126/70 100 %   12/30/17 2328 97.7 ??F (36.5 ??C) 88 16 121/74 100 %   12/30/17 1926 98.7 ??F (37.1 ??C) 94 18 125/74 98 %   12/30/17 1553 97.9 ??F (36.6 ??C) 67 17 123/64 100 %   12/30/17 1502 ??? 70 17 131/78 100 %   12/30/17 1447 ??? 72 17 135/81 ???   12/30/17 1433 ??? 79 18 142/77 100 %   12/30/17 1427 ??? 79 18 137/80 100 %   12/30/17 1422 ??? 95 16 (!) 154/101 100 %   12/30/17 1417 ??? (!) 102 12 (!) 148/91 100 %   12/30/17 1412 ??? (!) 126 (!) 33 (!) 172/121 99 %   12/30/17 1407 ??? 73 18 150/79 100 %   12/30/17 1403 ??? 77 18 (!) 159/94 100 %   12/30/17 1107 97.3 ??F (36.3 ??C) 67 18 126/80 100 %         Imaging  No results found.   Echo Results  (Last 48 hours)               12/30/17 1647  ECHO TRANSESOPHAGEAL (TEE) W OR WO CONTR Final result    Narrative:                                                                Study ID:    [id number]                                                   [facility]                                         [address]                                                 [place], [place]                                Transesophageal Echocardiogram Report  Name: [name] Wu, [name] Wu     Study Date: 12/30/2017 01:56 PM   MRN: [id number]              Patient Location: [id number]   DOB: [birth year]           Age: 57 yrs   Height: 68 in             Weight: 148 lb                                                                      BSA: 1.8    m2   BP: 123/64 mmHg           HR: 120   Gender: Female            Account #: [id number]   Reason For Study: TIA, Staph, Rule out BR   History: DM, HTN, Leukemia   Ordering Physician: Kathyrn Lass   Performed By: Tally Joe, RDCS       Interpretation Summary   A Transesophageal echocardiogram with 2-D, Colorflow Doppler, PW/CW Doppler   and Saline Bubble Study was performed.   The study was technically adequate.   Transesophageal echocardiogram was performed by Dr. Truman Hayward.   Summary:       1. Normal LV systolic function with an EF of 55%.   2. No valvular vegetations noted.   3. Mild aortic and pulmonic insufficiency. No hemodynamically significant   valvular abnormalities.   4. No left atrial appendage thrombus.   5. Negative bubble study - no evidence of interatrial shunting.   6. Patient tolerated the procedure well. No complications.   _____________________________________________________________________________   __           Left Ventricle   The left ventricle is normal in size. There is normal left ventricular wall   thickness. Left ventricular systolic function is normal. The estimated left   ventricular ejection fraction is 55%.       Right Ventricle   The right ventricle is normal size. There is normal right ventricular wall   thickness. The right ventricular systolic function is normal.       Atria   Saline Contrast was used which demonstrated no shunting across the atrial   septum. No thrombus is detected in the left atrial appendage.           Mitral Valve   Normal in structure. There is no evidence of mitral valve prolapse. There is   no vegetation seen on the mitral valve. There is no mitral valve stenosis.    There is trivial mitral regurgitation.       Tricuspid Valve   The tricuspid valve is normal. There is no tricuspid valve prolapse. There is   no tricuspid stenosis. There is mild tricuspid regurgitation.       Aortic Valve   The aortic valve is normal in structure and function. There is no aortic   valvular vegetation. No aortic stenosis. Mild aortic regurgitation.       Pulmonic Valve   The pulmonic valve leaflets are thin and pliable; valve motion is normal.   There is no vegetation on the pulmonic valve. There is no pulmonic valvular   stenosis. Mild pulmonic valvular regurgitation.  Vessels   The aortic root is normal size. Mild atherosclerosis noted in aorta.           Pericardium   There is no pericardial effusion.       Procedure:   The patient was brought to the holding area in a fasting state. Both the   benefits and risks of the procedure were explained in detail to the patient.   The patient understands the risks of major complications. Informed consent    was   obtained from the patient and appropriate timeout was observed. After topical   anesthesia of the oropharynx was acheived, moderate sedation was    administered.   The echoscope removed atraumatically. The Omniplane echoscope was easily   introduced into the mouth and esophagus with echo and Doppler imaging   performed without difficulty. The patient observed post sedation with stable   cardiopulmonary and neurologic status and received orders to resume diet and   medications. Pre-Procedure BP: 159/94. Post-Procedure BP: 154/101.       Medications:   The oropharynx was locally anesthetized with viscous lidocaine and/or   benzocaine spray. Moderate sedation was ordered by Cardiologist, Dr. Truman Hayward.   Moderate sedation was started at 14:02. Procedure ended at 14:23. Patient   received 2 mg of Versed and 53mcg of Fentanyl. for sedation.       Complications   The patient tolerated the procedure well; there were no complications.        MEASUREMENTS/CALCULATIONS:               Electronically signed byDr Maurice Small, MD   12/30/2017 04:47 PM               Labs  No results found for this visit on 12/24/17 (from the past 12 hour(s)).  Lab Results   Component Value Date/Time    Cholesterol, total 174 12/26/2017 04:40 AM    HDL Cholesterol 57 12/26/2017 04:40 AM    LDL, calculated 88 12/26/2017 04:40 AM    Triglyceride 145 12/26/2017 04:40 AM    CHOL/HDL Ratio 3.1 12/26/2017 04:40 AM           Delora Fuel, MD

## 2017-12-31 NOTE — Progress Notes (Signed)
Pt in [place] 35 yof, dx TIA/Bacteremia. Attend [place]. Pt is requesting albuterol treatments. States she has asthma and experiencing SOB. Doran Stabler [phone].    Lungs are clearm, 100% O2 on room air     Order albuterol 2.5 nebulizer q4prn

## 2017-12-31 NOTE — Progress Notes (Signed)
Progressing towards goals.

## 2017-12-31 NOTE — Progress Notes (Signed)
[facility]   CARDIOLOGY PROGRESS NOTE    POD # 1, TEE for bacteremia - no vegetations    S: c/o "scratchy throat, denies dysphagia/odynophagia  O: VSS, cor-RRR, lungs clear, no JVD or edema, Abd - benign  A: no evidence of SBE  P: prn Cepacol lozenges      d/w Dr. Hedy Jacob C. Herma Mering, MD, [place], [place], [facility]  Phone:  [phone]    12/31/2017  4:14 PM

## 2017-12-31 NOTE — Other (Signed)
Bedside and Verbal shift change report given to [place] (oncoming nurse) by Danie Binder (offgoing nurse). Report included the following information SBAR.

## 2017-12-31 NOTE — Progress Notes (Signed)
Messaged Pharmacy:  IV Vanc admin time needs to be adjusted. Previous admin was delayed d/t PIV infiltration. Was administered at [place]. New order for trough being entered for 1300

## 2017-12-31 NOTE — Progress Notes (Signed)
[facility] Dosing Services: Vancomycin    Trough was drawn 4 hours early around 0900 and resulted as 22.6. Actual trough would have been roughly 16.8, which is in goal. Will maintain patient on vancomycin 1250 mg q12h. However, at the request of Dr. Glendora Score, we will be making vancomycin 1250 mg in 500 mL of 0.9% NS and not 250 mL due to patient experiencing burning sensation. Pharmacy will continue to monitor and observe clinical situation. Thanks!    Cleophus Molt, PharmD

## 2017-12-31 NOTE — Progress Notes (Addendum)
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[place], last ~1 mo ago per Tan's note --[phone]  -on tassigra comp GVH skin, esophagus   -bactrim tiw, acyclovir prophylaxis  -pancytopenia ch thrombocytopenia, not neutropenic (ANC 8/15 1800)    L numbness, weakness n/v vision change diplopia began Monday 8/12- CTH neg   resolved    GVHD- esophagitis/ diarrhea     improved    thrush - improved   DM on insulin    HTN    PCN allergy       Rec:   ->continue iv vancomycin  ->monitor picc tip from 8/17 - positive for MRSE  ->monitor repeat bctx's from 8/18- now 1 of 2 + for MRSE  -> TEE 8/20 neg     ->  Repeat bl cx x 2  From 8/20  to confirm clearance of bacteremia.        If 8/20 bl cx neg after 2-3 days may replace picc -- pt says need iv access 2x/week;        If remain neg@48  h, ok to place picc    Will write OPAT:  ALL sp chemo / Stem cell tx  MRSE CA BSI  Routine picc care per protocol  Vanco dosing per pharmacy- vanco trough goal between 15-20  vanco through sept 3  Monday labs: cbc/diff, bpm fax to  [phone]  Fu with ID 1 week post dc - will arrange           -> reviewed repeat MRI - neg      defer w/u for diplopia to Neurology, appears holding on LP for now, pt says better this am   Discussed plan with Dr Nira Retort and pt- at length.  I let ID office know to schedule her for fu appt  MICROBIOLOGY:   8/14 bctx x 2 MRSE   uctx ngtd  8/16 bctx x 2 S epi see prior  8/17     Cath tip > S epi  8/18 bctx x 2 ip- 1 of 2  G+ cocci    LINES AND CATHETERS:   PICC- dc'ed  8/17   piv intact    Active Hospital Problems    Diagnosis Date Noted   ??? Bacteremia 12/26/2017   ??? TIA (transient ischemic attack) 12/25/2017         Subjective:     Interval notes reviewed. Pt says feeling better, stinging with vanco infusion.  PT many questions today-answered and discussed plan as outlined above.  Mouth bettter    Current Facility-Administered Medications   Medication Dose Route Frequency   ??? Vancomycin Trough Level Due   Other ONCE   ??? 0.9% sodium chloride infusion  83 mL/hr IntraVENous CONTINUOUS   ??? fentaNYL citrate (PF) injection 12.5-50 mcg  12.5-50 mcg IntraVENous PRN   ??? lidocaine (XYLOCAINE) 2 % viscous solution 15 mL  15 mL Mouth/Throat PRN   ???  midazolam (VERSED) injection 0.25-2 mg  0.25-2 mg IntraVENous PRN   ??? nystatin (MYCOSTATIN) 100,000 unit/mL oral suspension 500,000 Units  500,000 Units Oral QID   ??? vancomycin (VANCOCIN) 1,250 mg in 0.9% sodium chloride 250 mL IVPB  1,250 mg IntraVENous Q12H   ??? sodium chloride (NS) flush 10 mL  10 mL InterCATHeter DAILY   ??? sodium chloride (NS) flush 10-20 mL  10-20 mL InterCATHeter PRN   ??? heparin (porcine) pf 50 Units  50 Units InterCATHeter PRN   ??? heparin (porcine) pf 50 Units  50 Units InterCATHeter DAILY   ??? DULoxetine (CYMBALTA) capsule 20 mg  20 mg Oral DAILY   ??? magnesium oxide (MAG-OX) tablet 400 mg  400 mg Oral BID   ??? pantoprazole (PROTONIX) tablet 40 mg  40 mg Oral DAILY   ??? acyclovir (ZOVIRAX) tablet 400 mg  400 mg Oral BID   ??? cholecalciferol (VITAMIN D3) tablet 5,000 Units  5,000 Units Oral DAILY   ??? dextrose (D50) infusion 5-25 g  10-50 mL IntraVENous PRN   ??? glucagon (GLUCAGEN) injection 1 mg  1 mg IntraMUSCular PRN   ??? insulin glargine (LANTUS) injection 1-100 Units  1-100 Units SubCUTAneous QHS   ??? insulin lispro (HUMALOG) injection 1-100 Units  1-100 Units SubCUTAneous AC&HS   ??? insulin lispro (HUMALOG) injection 1-100 Units  1-100 Units SubCUTAneous PRN   ??? oxyCODONE IR (ROXICODONE) tablet 10 mg  10 mg Oral Q4H PRN    ??? potassium chloride (K-DUR, KLOR-CON) SR tablet 40 mEq  40 mEq Oral BID   ??? tacrolimus (PROGRAF) capsule 4 mg  4 mg Oral BID   ??? prochlorperazine (COMPAZINE) tablet 10 mg  10 mg Oral Q4H PRN   ??? zolpidem (AMBIEN) tablet 5 mg  5 mg Oral QHS PRN   ??? trimethoprim-sulfamethoxazole (BACTRIM DS, SEPTRA DS) 160-800 mg per tablet 1 Tab  1 Tab Oral Q MON, WED & FRI   ??? naloxone (NARCAN) injection 0.1 mg  0.1 mg IntraVENous PRN   ??? acetaminophen (TYLENOL) tablet 650 mg  650 mg Oral Q4H PRN    Or   ??? acetaminophen (TYLENOL) solution 650 mg  650 mg Oral Q4H PRN    Or   ??? acetaminophen (TYLENOL) suppository 650 mg  650 mg Rectal Q4H PRN   ??? nilotinib (TASIGNA) capsule cap 200 mg  (Patient Supplied)  200 mg Oral BID   ??? LORazepam (ATIVAN) tablet 1 mg  1 mg Oral ON CALL   ??? budesonide (ENTOCORT EC) capsule 6 mg  6 mg Oral TID   ??? *Pharmacy dosing vancomycin  1 Each Other Rx Dosing/Monitoring         Objective:     Visit Vitals  BP 126/70 (BP 1 Location: Left arm, BP Patient Position: Supine)   Pulse 99   Temp 98 ??F (36.7 ??C)   Resp 16   Ht 5' (1.524 m)   Wt 66 kg (145 lb 8.1 oz)   SpO2 100%   BMI 22.12 kg/m??       Temp (24hrs), Avg:97.9 ??F (36.6 ??C), Min:97.3 ??F (36.3 ??C), Max:98.7 ??F (37.1 ??C)    GEN: WFWN BF appears stated age lying in bed NAD   HEENT: anicteric sclerae moist orophyarynx  NECK: Supple   CHEST: no rales rhonchi or wheeze  CVS: S1'S2' no rub murmur or gallop  ABD: soft non-tender (+) BS no masses non-distended  EXT: no edema LUE picc out     Labs: Results:   Chemistry  Recent Labs     12/29/17  0440 12/28/17  1201   GLU 236*  --    NA 140  --    K 5.1  --    CL 107  --    CO2 28  --    BUN 10  --    CREA 0.8 0.8   CA 8.9  --    AGAP 5  --       CBC w/Diff Recent Labs     12/29/17  0440   WBC 4.1   RBC 2.77*   HGB 9.4*   HCT 28.2*   PLT 64*   GRANS 77.7*   LYMPH 12.6*        Mri brain 8/16 IMPRESSION:  1.   Nonspecific white matter hyperintensities likely due to chronic   microvascular ischemic changes. Iatrogenic drug toxicity, graft-versus-host,  inflammatory/infectious, hypertension, demyelinating process, trauma, chronic  headaches, sequelae of vasculitis are other considerations.  2. Low-lying cerebellar tonsils.  3. No orbital masses. No infarction    8/17 mri IMPRESSION:  ??  1.  No abnormal enhancement to suggest active infectious/inflammatory/neoplastic  process    Echo 8/16 Interpretation Summary  A complete two-dimensional transthoracic echocardiogram was performed (2D, M-  mode, Doppler and color flow Doppler).  The study was technically adequate.    Small left ventricle, norla wall thickness with normal systolic function and   a  calculated ejectio fraction of 77%.  No regional wall motion abnormalites are noted.  Grade 1 diastolic dysfunction.  Normal size right ventricle, normal systolic function.  No hemodynamic valvular pathology or signficiant regurgitation.  Estimated right ventricular systolic pressure: 24QAST.  Negative bubble study for interatrial shunting.      35 min spent with > 50% spent on counseling and coordination of care    Collene Mares, MD  December 31, 2017  [facility]  [phone]

## 2017-12-31 NOTE — Progress Notes (Signed)
Hospitalist Progress Note  Kristen Loader, MD  [facility] Hospitalists    Daily Progress Note: 12/31/2017    Assessment/Plan:     Possible TIA  Pt has multiple positive BCx with Staph epi.  PICC tip was positive for S epi.  Pt with TIA and faint murmur on exam.  Possiblility of septic emboli  TEE 8/20 was negative for vegetations. BE ruled out  Neuro rec ASA if not contraindicated (pt with some thrombocytopenia)    Staph epidermidis Sepsis  Immunocompromised pt  TEE 8/20 was negative for vegetations  ID is following  Pt is on Bactrim TIW and Vancomycin  BCx 8/20 NGTD, d/w ID, that if neg by tomorrow afternoon, then pt can get PICC on Friday and plan on d/c home at that point with OPAT (Vanco)    Diplopia with transient slurred speech, LUE weakness, gait disturbances.   Possible TIA  Work-up as above  Sx have resolved.  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  I spoke with her lead BMT oncologist and he noted to continue treatment here for infxn and once stable for discharge to d/c home so she can go to [place] on her own.  Pt is on Tasigna 288m BID    Esophagitis and diarrhea d/t GVH dz   Seems to have improved.   Pt is on Compazine PRN    Oral thrush.  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  Replete and recheck  Pt noted she gets routine IV Mag  Continue PO magOx.    Chronic pain  APAP and Oxycodone PRN    DVT Prophylaxis.  SCDs  No heparin due to plt <100    Code status. Full code.    Disposition.  S/p TEE 8/20 which was neg for vegetations  Continue abx per ID   When medically stable, will plan to d/c home and [place] Friday if BCx ok and PICC placed    ------------------    36 minutes were spent on the patient of which more than 50% was spent in coordination of care and counseling (time spent with patient/family face to face, physical exam, reviewing laboratory and imaging investigations, speaking with physicians and nursing staff involved in this patient's care)    ------------------  Physical exam:  General:  Alert, NAD, pleasant.  Cooperative  HEENT: Sclera anicteric, PERRL, OM moist, throat clear. Oral thrush  Chest:  CTA B, no wheeze, no rales, no rhonchi.  Good air movement.  CV: RRR, S1/S2 were normal, no murmur  Abdomen: NTND, soft, NABS, no masses were noted.  No rebound, no guarding.  Extremities: No edema.    Subjective:   Pt noted that she feels okay.  We discussed the plans for monitoring BCx and possible PICC Friday if neg.  No Cp, no f/C, no abd pain    Objective:     Visit Vitals  BP 126/65 (BP 1 Location: Left arm, BP Patient Position: Supine)   Pulse 82  Temp 98.3 ??F (36.8 ??C)   Resp 17   Ht 5' (1.524 m)   Wt 66 kg (145 lb 8.1 oz)   SpO2 98%   BMI 22.12 kg/m??      O2 Device: Room air    Temp (24hrs), Avg:98 ??F (36.7 ??C), Min:97.7 ??F (36.5 ??C), Max:98.3 ??F (36.8 ??C)    No intake/output data recorded.   08/20 0701 - 08/21 1900  In: 250 [I.V.:250]  Out: -       Data Review:       Recent Days:  Recent Labs     12/29/17  0440   WBC 4.1   HGB 9.4*   HCT 28.2*   PLT 64*     Recent Labs     12/29/17  0440   NA 140   K 5.1   CL 107   CO2 28   GLU 236*   BUN 10   CREA 0.8   CA 8.9   MG 1.4*     No results for input(s): PH, PCO2, PO2, HCO3, FIO2 in the last 72 hours.    24 Hour Results:  Recent Results (from the past 24 hour(s))   GLUCOSE, POC    Collection Time: 12/30/17  8:47 PM   Result Value Ref Range    Glucose (POC) 189 (H) 65 - 105 mg/dL   VANCOMYCIN, TROUGH     Collection Time: 12/31/17  9:52 AM   Result Value Ref Range    Vancomycin,trough 22.6 (HH) 15.0 - 20.0 mcg/ml   GLUCOSE, POC    Collection Time: 12/31/17 10:21 AM   Result Value Ref Range    Glucose (POC) 129 (H) 65 - 105 mg/dL   GLUCOSE, POC    Collection Time: 12/31/17  1:07 PM   Result Value Ref Range    Glucose (POC) 167 (H) 65 - 105 mg/dL       Xr Chest Pa Lat    Result Date: 12/24/2017  Chest Indication: hx of GVHD with N/V/D.    Comparison: 05/20/2017 Findings: Frontal and lateral views of the chest demonstrate that the cardiac silhouette is not enlarged.  There is no focal infiltrate, pleural effusion, vascular congestion, or pneumothorax.  Left PICC with tip overlying the distal superior vena cava.     Impression: No acute cardiopulmonary disease.     Mri Brain Wo Cont    Result Date: 12/26/2017  EXAMINATION: MRI BRAIN WO CONT CLINICAL INDICATION: Diplopia; TIA bone marrow transplant. Blurred vision COMPARISON:  None. TECHNIQUE: T1 and T2-weighted sequences of the brain were obtained in multiple planes without contrast. FINDINGS: No restricted diffusion. No masses, no midline shift. No hydrocephalus. No extra-axial fluid collections. Visualized portions of the orbits grossly unremarkable. Mastoid air cells well aerated. Visualized portions of paranasal sinuses unremarkable. Intracranial flow voids of great vessels, grossly intact. Diffuse low signal marrow. Age-appropriate atrophy. No susceptibility artifact on gradient echo imaging to suggest hemorrhage. Cerebellar tonsil extends into the foramen magnum 2 mm or so. Pituitary gland normal. Orbits appear normal. No compression of optic chiasm extraocular muscles are normal in size. No orbital masses. Several foci of T2 prolongation in corona radiata and centrum semiovale. Largest in the right frontal lobe measures 8 mm and in the left parietal lobe millimeters 10 mm. Several additional smaller lesions are noted in the frontal and parietal lobes.      IMPRESSION: 1.   Nonspecific white matter hyperintensities likely due to chronic microvascular ischemic changes. Iatrogenic drug toxicity, graft-versus-host, inflammatory/infectious,  hypertension, demyelinating process, trauma, chronic headaches, sequelae of vasculitis are other considerations. 2. Low-lying cerebellar tonsils. 3. No orbital masses. No infarction.     Mri Brain W Cont    Result Date: 12/27/2017  MRI OF THE BRAIN INDICATION: diplopia.    COMPARISON: Noncontrast MRI 12/26/2017 TECHNIQUE: Multiplanar, multisequence MRI of the brain was performed without intravenous contrast.  FINDINGS: The patient returned for contrast-enhanced imaging of the brain. Images are degraded by patient motion artifacts. However, there is no abnormal parenchymal, dural, or meningeal enhancement. No gross sellar mass.     IMPRESSION: 1.  No abnormal enhancement to suggest active infectious/inflammatory/neoplastic process     Ct Head Wo Cont    Result Date: 12/25/2017  DICOM format image data is available to non-affiliated external healthcare facilities or entities on a secure, media free, reciprocally searchable basis with patient authorization for 12 months following the date of the study. Clinical history: Diplopia EXAMINATION: Noncontrast CT scan of the head 12/24/2017. 4 mm axial scanning is performed from the skull base to the vertex. Coronal and sagittal reconstruction imaging has been obtained. Correlation: None FINDINGS: Visualized paranasal sinuses and mastoid air cells are clear. No acute intracranial hemorrhage, mass or infarction.     IMPRESSION: Normal unenhanced CT scan of the head. Initial interpretation: [facility]     Cta Head    Result Date: 12/26/2017  CTA OF THE HEAD INDICATION: Nausea, sweating, vomiting, diarrhea COMPARISON: MRI 12/26/2017, CT 12/24/2017 TECHNIQUE: Noncontrast CT the head performed followed by CTA of the head with intravenous contrast.  Multiplanar two-dimensional and three-dimensional maximum intensity projection reformats performed and reviewed. DICOM format image data is available to non-affiliated external healthcare facilities or entities on a secure, media free, reciprocally searchable basis with patient authorization for 12 months following the date of the study. FINDINGS: Noncontrast CT of the head demonstrates no acute segmental infarct, intracranial hemorrhage, mass, midline shift, extra-axial fluid collection, or hydrocephalus. The vertebral arteries are codominant. There is no focal hemodynamically significant stenosis within the anterior or posterior circulation. The anterior and posterior communicating arteries appear patent. There is no definitive aneurysm.     IMPRESSION: No focal hemodynamically significant stenosis.     Cta Neck    Result Date: 12/26/2017  CTA OF THE NECK WITH CONTRAST INDICATION: Speech difficulty, and balance COMPARISON: No relevant studies. TECHNIQUE: CTA of the neck was performed with nonionic intravenous contrast with multiplanar 2-dimensional and maximum intensity projection three-dimensional images. Stenosis was evaluated using the NASCET criteria. DICOM format image data is available to non-affiliated external healthcare facilities or entities on a secure, media free, reciprocally searchable basis with patient authorization for 12 months following the date of the study. FINDINGS: There is no hemodynamically significant stenosis within the carotid or vertebral arteries. There is no evidence of dissection. CAROTID STENOSIS REFERENCE USING NASCET CRITERIA % Stenosis = (1 - narrowest diameter/diameter of distal artery) x100 Mild: < 50% stenosis Moderate: 50-69% stenosis. Severe: 70-94% stenosis. Near occlusion: 95-99% stenosis. Occluded: 100% stenosis     IMPRESSION: No focal hemodynamically significant stenosis.       Microbiology:  All Micro Results     Procedure Component Value Units Date/Time     CULTURE, BLOOD [[id number]]  (Abnormal) Collected:  12/28/17 0249    Order Status:  Completed Specimen:  Blood Updated:  12/31/17 0721     Blood Culture Result --        Possible contaminant. No further workup will be performed unless directly requested to the Microbiology  department at ext. [phone] with 72 hours of this report.  Gram stain Gram Positive Cocci In Clusters  Called to/read back DEREK BOZMAN 6EST  on 12/29/2017  at 0600  by VGP       Isolate       Coagulase Negative Staphylococcus          CULTURE, BLOOD [[id number]] Collected:  12/28/17 0249    Order Status:  Completed Specimen:  Blood Updated:  12/31/17 0706     Blood Culture Result No Growth At 72 Hours       CULTURE, BLOOD [[id number]] Collected:  12/30/17 1820    Order Status:  Completed Specimen:  Blood from [facility] Updated:  12/31/17 0705     Blood Culture Result       Culture In Progress, Daily Updates To Follow          CULTURE, BLOOD [[id number]] Collected:  12/30/17 1825    Order Status:  Completed Specimen:  Blood from [facility] Updated:  12/31/17 0705     Blood Culture Result       Culture In Progress, Daily Updates To Follow          CULTURE, CATHETER TIP [[id number]]  (Abnormal)  (Susceptibility) Collected:  12/27/17 1550    Order Status:  Completed Specimen:  Catheter Tip from PICC Updated:  12/30/17 1106     Isolate --        >15 CFU  Staphylococcus epidermidis      CULTURE, BLOOD [[id number]] Collected:  12/29/17 1830    Order Status:  Canceled Specimen:  Blood     CULTURE, BLOOD [[id number]] Collected:  12/29/17 1830    Order Status:  Canceled Specimen:  Blood     CULTURE, BLOOD [[id number]]  (Abnormal) Collected:  12/26/17 1240    Order Status:  Completed Specimen:  Blood from [facility] Updated:  12/28/17 0915     Blood Culture Result       Gram stain Gram Positive Cocci In Clusters           Isolate --        Staphylococcus epidermidis  See Previous Culture For Susceptibility Report       CULTURE, BLOOD [[id number]]  (Abnormal) Collected:  12/26/17 1246    Order Status:  Completed Specimen:  Blood from PICC Updated:  12/28/17 0913     Blood Culture Result --        Gram stain Gram Positive Cocci In Clusters  Called to/read back sissy  on 12/27/17  at [place]  by [id number]       Isolate --        Staphylococcus epidermidis  See Previous Culture For Susceptibility Report      CULTURE, BLOOD [[id number]]  (Abnormal) Collected:  12/24/17 2255    Order Status:  Completed Specimen:  Blood from [facility] Updated:  12/27/17 0910     Blood Culture Result --        Gram stain Gram Positive Cocci In Clusters  Called to/read back jannet  on 12/25/17  at 1751  by [id number]       Isolate --        See Previous Culture For Susceptibility Report  Staphylococcus epidermidis      CULTURE, BLOOD [[id number]]  (Abnormal)  (Susceptibility) Collected:  12/24/17 2230    Order Status:  Completed Specimen:  Blood from [facility] Updated:  12/27/17 0749     Blood Culture Result --  Gram stain Gram Positive Cocci In Clusters  Called to/read back jannet  on 12/25/17  at 1751  by [id number]       Isolate       Staphylococcus epidermidis          CULTURE, BLOOD FOR MRSA/SA BY PCR [[id number]] Collected:  12/24/17 0951    Order Status:  Completed Specimen:  Blood Updated:  12/26/17 1133     Blood culture, MRSA by PCR NEGATIVE        Blood culture, S. aureus by PCR NEGATIVE        Positive culture accn number [id number]        Comment: This PCR testing was performed from the blood culture accession number that is noted. Refer to  this accession number for antibiotic susceptibility testing and final identification.         CULTURE, URINE [[id number]]  (Abnormal) Collected:  12/24/17 2255    Order Status:  Completed Specimen:  Urine from [facility] Updated:  12/26/17 1021     Culture result --        30,000 CFU/mL  Mixed Flora, three or more different organisms present  No Further Workup             Cardiology:   Results for orders placed or performed during the hospital encounter of 12/24/17   EKG, 12 LEAD, INITIAL   Result Value Ref Range    Ventricular Rate 104 BPM    Atrial Rate 104 BPM    P-R Interval 148 ms    QRS Duration 66 ms    Q-T Interval 328 ms    QTC Calculation (Bezet) 431 ms    Calculated P Axis 18 degrees    Calculated R Axis 17 degrees    Calculated T Axis 16 degrees    Diagnosis       Sinus tachycardia  Otherwise normal ECG  No previous ECGs available  Confirmed by Marrion Coy, M.D., Ellin Saba. (30) on 12/25/2017 7:41:35 AM           Problem List:  Problem List as of 12/31/2017 Date Reviewed: 2017/12/29          Codes Class Noted - Resolved    Bacteremia ICD-10-CM: R78.81  ICD-9-CM: 790.7  12/26/2017 - Present        TIA (transient ischemic attack) ICD-10-CM: G45.9  ICD-9-CM: 435.9  12/25/2017 - Present        Thrombocytopenia ([place]) ICD-10-CM: D69.6  ICD-9-CM: 287.5  05/13/2017 - Present        Neutropenic fever ([place]) ICD-10-CM: D70.9, R50.81  ICD-9-CM: 288.00, 780.61  05/13/2017 - Present        Chest pressure ICD-10-CM: R07.89  ICD-9-CM: 786.59  04/25/2017 - Present        Elevated troponin ICD-10-CM: R74.8  ICD-9-CM: 790.6  04/25/2017 - Present              Medications reviewed  Current Facility-Administered Medications   Medication Dose Route Frequency   ??? vancomycin (VANCOCIN) 1,250 mg in 0.9% sodium chloride 500 mL IVPB  1,250 mg IntraVENous Q12H   ??? benzocaine-menthol (CEPACOL) lozenge 1 Lozenge  1 Lozenge Mucous Membrane PRN   ??? 0.9% sodium chloride infusion  83 mL/hr IntraVENous CONTINUOUS   ??? fentaNYL citrate (PF) injection 12.5-50 mcg  12.5-50 mcg IntraVENous PRN   ??? midazolam (VERSED) injection 0.25-2 mg  0.25-2 mg IntraVENous PRN   ??? nystatin (MYCOSTATIN) 100,000 unit/mL oral suspension 500,000 Units  500,000 Units Oral QID   ???  sodium chloride (NS) flush 10-20 mL  10-20 mL InterCATHeter PRN   ??? heparin (porcine) pf 50 Units  50 Units InterCATHeter PRN    ??? DULoxetine (CYMBALTA) capsule 20 mg  20 mg Oral DAILY   ??? magnesium oxide (MAG-OX) tablet 400 mg  400 mg Oral BID   ??? pantoprazole (PROTONIX) tablet 40 mg  40 mg Oral DAILY   ??? acyclovir (ZOVIRAX) tablet 400 mg  400 mg Oral BID   ??? cholecalciferol (VITAMIN D3) tablet 5,000 Units  5,000 Units Oral DAILY   ??? dextrose (D50) infusion 5-25 g  10-50 mL IntraVENous PRN   ??? glucagon (GLUCAGEN) injection 1 mg  1 mg IntraMUSCular PRN   ??? insulin glargine (LANTUS) injection 1-100 Units  1-100 Units SubCUTAneous QHS   ??? insulin lispro (HUMALOG) injection 1-100 Units  1-100 Units SubCUTAneous AC&HS   ??? insulin lispro (HUMALOG) injection 1-100 Units  1-100 Units SubCUTAneous PRN   ??? oxyCODONE IR (ROXICODONE) tablet 10 mg  10 mg Oral Q4H PRN   ??? potassium chloride (K-DUR, KLOR-CON) SR tablet 40 mEq  40 mEq Oral BID   ??? tacrolimus (PROGRAF) capsule 4 mg  4 mg Oral BID   ??? prochlorperazine (COMPAZINE) tablet 10 mg  10 mg Oral Q4H PRN   ??? zolpidem (AMBIEN) tablet 5 mg  5 mg Oral QHS PRN   ??? trimethoprim-sulfamethoxazole (BACTRIM DS, SEPTRA DS) 160-800 mg per tablet 1 Tab  1 Tab Oral Q MON, WED & FRI   ??? naloxone (NARCAN) injection 0.1 mg  0.1 mg IntraVENous PRN   ??? acetaminophen (TYLENOL) tablet 650 mg  650 mg Oral Q4H PRN    Or   ??? acetaminophen (TYLENOL) solution 650 mg  650 mg Oral Q4H PRN    Or   ??? acetaminophen (TYLENOL) suppository 650 mg  650 mg Rectal Q4H PRN   ??? nilotinib (TASIGNA) capsule cap 200 mg  (Patient Supplied)  200 mg Oral BID   ??? LORazepam (ATIVAN) tablet 1 mg  1 mg Oral ON CALL   ??? budesonide (ENTOCORT EC) capsule 6 mg  6 mg Oral TID   ??? *Pharmacy dosing vancomycin  1 Each Other Rx Dosing/Monitoring         Kristen Loader, MD

## 2018-01-01 LAB — CBC WITH AUTOMATED DIFF
Anisocytosis: 1
BAND NEUTROPHILS: 3.8 % (ref 0–11)
BASOPHILS: 1 % (ref 0–3)
HCT: 28.5 % — ABNORMAL LOW (ref 37.0–50.0)
HGB: 9.6 gm/dl — ABNORMAL LOW (ref 13.0–17.2)
LYMPHOCYTES: 15.4 % — ABNORMAL LOW (ref 28–48)
MCH: 34.4 pg (ref 25.4–34.6)
MCHC: 33.7 gm/dl (ref 30.0–36.0)
MCV: 102.2 fL — ABNORMAL HIGH (ref 80.0–98.0)
MONOCYTES: 14.4 % — ABNORMAL HIGH (ref 1–13)
MPV: 10.5 fL — ABNORMAL HIGH (ref 6.0–10.0)
MYELOCYTES: 1 % — ABNORMAL HIGH (ref 0–0)
Macrocytes: 1
NEUTROPHILS: 64.4 % — ABNORMAL HIGH (ref 34–64)
PLATELET COMMENTS: DECREASED
PLATELET: 115 10*3/uL — ABNORMAL LOW (ref 140–450)
Polychromasia: 1
RBC: 2.79 M/uL — ABNORMAL LOW (ref 3.60–5.20)
RDW-SD: 61.3 — ABNORMAL HIGH (ref 36.4–46.3)
WBC: 4.4 10*3/uL (ref 4.0–11.0)

## 2018-01-01 LAB — GLUCOSE, POC
Glucose (POC): 187 mg/dL — ABNORMAL HIGH (ref 65–105)
Glucose (POC): 270 mg/dL — ABNORMAL HIGH (ref 65–105)
Glucose (POC): 191 mg/dL — ABNORMAL HIGH (ref 65–105)
Glucose (POC): 349 mg/dL — ABNORMAL HIGH (ref 65–105)
Glucose (POC): 305 mg/dL — ABNORMAL HIGH (ref 65–105)

## 2018-01-01 LAB — RENAL FUNCTION PANEL
Albumin: 3 gm/dl — ABNORMAL LOW (ref 3.4–5.0)
Albumin: 3 gm/dl — ABNORMAL LOW (ref 3.4–5.0)
Anion Gap: 7 mmol/L (ref 5–15)
Anion gap: 7 mmol/L (ref 5–15)
BUN: 14 mg/dl (ref 7–25)
BUN: 14 mg/dl (ref 7–25)
CO2: 27 mEq/L (ref 21–32)
CO2: 27 mEq/L (ref 21–32)
Calcium: 8.6 mg/dl (ref 8.5–10.1)
Calcium: 8.6 mg/dl (ref 8.5–10.1)
Chloride: 110 mEq/L — ABNORMAL HIGH (ref 98–107)
Chloride: 110 mEq/L — ABNORMAL HIGH (ref 98–107)
Creatinine: 1 mg/dl (ref 0.6–1.3)
Creatinine: 1 mg/dl (ref 0.6–1.3)
EGFR IF NonAfrican American: >60
GFR African American: >60
GFR est AA: >60
GFR est non-AA: >60
Glucose: 198 mg/dl — ABNORMAL HIGH (ref 74–106)
Glucose: 198 mg/dl — ABNORMAL HIGH (ref 74–106)
Phosphorus: 3.2 mg/dl (ref 2.5–4.9)
Phosphorus: 3.2 mg/dl (ref 2.5–4.9)
Potassium: 4.9 mEq/L (ref 3.5–5.1)
Potassium: 4.9 mEq/L (ref 3.5–5.1)
Sodium: 144 mEq/L (ref 136–145)
Sodium: 144 mEq/L (ref 136–145)

## 2018-01-01 LAB — MAGNESIUM
Magnesium: 1.3 mg/dl — ABNORMAL LOW (ref 1.6–2.6)
Magnesium: 1.3 mg/dl — ABNORMAL LOW (ref 1.6–2.6)

## 2018-01-01 LAB — CBC WITH AUTO DIFFERENTIAL
Anisocytosis: 1
Band Neutrophils: 3.8 % (ref 0–11)
Basophils %: 1 % (ref 0–3)
Hematocrit: 28.5 % — ABNORMAL LOW (ref 37.0–50.0)
Hemoglobin: 9.6 gm/dl — ABNORMAL LOW (ref 13.0–17.2)
Lymphocytes %: 15.4 % — ABNORMAL LOW (ref 28–48)
MACROCYTES, MACROCYT: 1
MCH: 34.4 pg (ref 25.4–34.6)
MCHC: 33.7 gm/dl (ref 30.0–36.0)
MCV: 102.2 fL — ABNORMAL HIGH (ref 80.0–98.0)
MPV: 10.5 fL — ABNORMAL HIGH (ref 6.0–10.0)
Monocytes %: 14.4 % — ABNORMAL HIGH (ref 1–13)
Myelocyte Percent: 1 % — ABNORMAL HIGH (ref 0–0)
Neutrophils %: 64.4 % — ABNORMAL HIGH (ref 34–64)
POLYCHROMASIA: 1
Platelet Comment: DECREASED
Platelets: 115 10*3/uL — ABNORMAL LOW (ref 140–450)
RBC: 2.79 M/uL — ABNORMAL LOW (ref 3.60–5.20)
RDW-SD: 61.3 — ABNORMAL HIGH (ref 36.4–46.3)
WBC: 4.4 10*3/uL (ref 4.0–11.0)

## 2018-01-01 LAB — POCT GLUCOSE
POC Glucose: 187 mg/dL — ABNORMAL HIGH (ref 65–105)
POC Glucose: 270 mg/dL — ABNORMAL HIGH (ref 65–105)
POC Glucose: 191 mg/dL — ABNORMAL HIGH (ref 65–105)
POC Glucose: 349 mg/dL — ABNORMAL HIGH (ref 65–105)
POC Glucose: 305 mg/dL — ABNORMAL HIGH (ref 65–105)

## 2018-01-01 MED ORDER — ASPIRIN 81 MG CHEWABLE TAB
81 mg | Freq: Every day | ORAL | Status: DC
Start: 2018-01-01 — End: 2018-01-02
  Administered 2018-01-01 – 2018-01-02 (×2): via ORAL

## 2018-01-01 MED ORDER — BENZONATATE 100 MG CAP
100 mg | Freq: Three times a day (TID) | ORAL | Status: DC | PRN
Start: 2018-01-01 — End: 2018-01-02
  Administered 2018-01-01 – 2018-01-02 (×4): via ORAL

## 2018-01-01 MED ORDER — ALBUTEROL SULFATE 0.083 % (0.83 MG/ML) SOLN FOR INHALATION
2.5 mg /3 mL (0.083 %) | RESPIRATORY_TRACT | Status: DC | PRN
Start: 2018-01-01 — End: 2018-01-02
  Administered 2018-01-01 – 2018-01-02 (×3): via RESPIRATORY_TRACT

## 2018-01-01 MED ORDER — MAGNESIUM SULFATE 4 GRAM/100 ML IV PIGGY BACK
4 gram/100 mL ( %) | Freq: Four times a day (QID) | INTRAVENOUS | Status: AC
Start: 2018-01-01 — End: 2018-01-02
  Administered 2018-01-01 – 2018-01-02 (×2): via INTRAVENOUS

## 2018-01-01 MED FILL — POTASSIUM CHLORIDE SR 20 MEQ TAB, PARTICLES/CRYSTALS: 20 mEq | ORAL | Qty: 2

## 2018-01-01 MED FILL — ALBUTEROL SULFATE 0.083 % (0.83 MG/ML) SOLN FOR INHALATION: 2.5 mg /3 mL (0.083 %) | RESPIRATORY_TRACT | Qty: 1

## 2018-01-01 MED FILL — TACROLIMUS 1 MG CAP: 1 mg | ORAL | Qty: 4

## 2018-01-01 MED FILL — BUDESONIDE SR 3 MG 24 HR CAP: 3 mg | ORAL | Qty: 2

## 2018-01-01 MED FILL — ASPIRIN 81 MG CHEWABLE TAB: 81 mg | ORAL | Qty: 1

## 2018-01-01 MED FILL — NYSTATIN 100,000 UNIT/ML ORAL SUSP: 100000 unit/mL | ORAL | Qty: 5

## 2018-01-01 MED FILL — MAGNESIUM SULFATE 4 GRAM/100 ML IV PIGGY BACK: 4 gram/100 mL ( %) | INTRAVENOUS | Qty: 100

## 2018-01-01 MED FILL — BENZONATATE 100 MG CAP: 100 mg | ORAL | Qty: 1

## 2018-01-01 MED FILL — DULOXETINE 20 MG CAP, DELAYED RELEASE: 20 mg | ORAL | Qty: 1

## 2018-01-01 MED FILL — ZOLPIDEM 5 MG TAB: 5 mg | ORAL | Qty: 1

## 2018-01-01 MED FILL — VANCOMYCIN 1,000 MG IV SOLR: 1000 mg | INTRAVENOUS | Qty: 1250

## 2018-01-01 MED FILL — ACYCLOVIR 800 MG TAB: 800 mg | ORAL | Qty: 1

## 2018-01-01 MED FILL — SORE THROAT (BENZOCAINE WITH MENTHOL) 15 MG-3.6 MG LOZENGES: Qty: 1

## 2018-01-01 MED FILL — CHOLECALCIFEROL (VITAMIN D3) 1,000 UNIT (25 MCG) TAB: ORAL | Qty: 5

## 2018-01-01 MED FILL — MAGNESIUM OXIDE 400 MG TAB: 400 mg | ORAL | Qty: 1

## 2018-01-01 MED FILL — PANTOPRAZOLE 40 MG TAB, DELAYED RELEASE: 40 mg | ORAL | Qty: 1

## 2018-01-01 NOTE — Progress Notes (Signed)
Discussed dcp with patient [place] for [place] received.

## 2018-01-01 NOTE — Progress Notes (Signed)
Problem: Falls - Risk of  Goal: *Absence of Falls  Description  Document [name] Wu Fall Risk and appropriate interventions in the flowsheet.  Outcome: Progressing Towards Goal  Note:   Fall Risk Interventions:  Mobility Interventions: Assess mobility with egress test, Communicate number of staff needed for ambulation/transfer, Patient to call before getting OOB, Bed/chair exit alarm         Medication Interventions: Assess postural VS orthostatic hypotension, Bed/chair exit alarm, Evaluate medications/consider consulting pharmacy, Patient to call before getting OOB, Teach patient to arise slowly    Elimination Interventions: Bed/chair exit alarm, Call light in reach, Patient to call for help with toileting needs              Problem: Diabetes Self-Management  Goal: *Disease process and treatment process  Description  Define diabetes and identify own type of diabetes; list 3 options for treating diabetes.  Outcome: Progressing Towards Goal     Problem: Gas Exchange - Impaired  Goal: *Absence of hypoxia  Outcome: Progressing Towards Goal

## 2018-01-01 NOTE — Progress Notes (Signed)
Bedside and Verbal shift change report given to [employer]   (oncoming nurse) by Gaynelle Arabian (offgoing nurse). Report included the following information SBAR, Kardex, Intake/Output, MAR, Recent Results and Quality Measures.

## 2018-01-01 NOTE — Progress Notes (Signed)
Paged PICC:  PT: [name] Wu  RM: [phone]  Pt c/o pain to PIV in Left hand. Requests new placement. Poor vascular status. Needs for IV Mag, then Vanc.  Thank you,  Hospital doctor Smith,RN

## 2018-01-01 NOTE — Progress Notes (Signed)
Spoke to Amy liaison for [facility] @ [phone], notified her of tentative dc 8/23, requested orders be faxed to [phone].

## 2018-01-01 NOTE — Progress Notes (Signed)
Problem: Falls - Risk of  Goal: *Absence of Falls  Description  Document [name] Wu Fall Risk and appropriate interventions in the flowsheet.  Outcome: Progressing Towards Goal  Note:   Fall Risk Interventions:  Mobility Interventions: Patient to call before getting OOB         Medication Interventions: Evaluate medications/consider consulting pharmacy, Patient to call before getting OOB    Elimination Interventions: Patient to call for help with toileting needs, Call light in reach              Problem: Gas Exchange - Impaired  Goal: *Absence of hypoxia  Outcome: Progressing Towards Goal

## 2018-01-01 NOTE — Progress Notes (Signed)
Progress  Notes by Kristen Loader, MD at 01/01/18 1918                Author: Kristen Loader, MD  Service: Hospitalist  Author Type: Physician       Filed: 01/01/18 1922  Date of Service: 01/01/18 1918  Status: Signed          Editor: Kristen Loader, MD (Physician)                                    Hospitalist Progress Note   Kristen Loader, MD   [facility] Hospitalists      Daily Progress Note: 01/01/2018        Assessment/Plan:        Probable TIA   Pt has multiple positive BCx with Staph epi.   PICC tip was positive for S epi.   Pt with TIA and faint murmur on exam.   TEE 8/20 was negative for vegetations.    BE ruled out   Neuro rec ASA if not contraindicated (pt with some thrombocytopenia)      Staph epidermidis Sepsis with CLABSI   Immunocompromised pt   TEE 8/20 was negative for vegetations   ID is following   Pt is on Bactrim TIW and Vancomycin   BCx 8/20 NGTD, d/w ID, that if neg by tomorrow afternoon, then pt can get PICC on Friday and plan on d/c home at that point with OPAT (Vanco)      Diplopia with transient slurred speech, LUE weakness, gait disturbances.    Probable TIA   Work-up as above   Sx have resolved.   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   I spoke with her lead BMT oncologist and he noted to continue treatment here for infxn and once stable for discharge to d/c home so she can go to [place] on her own.   Pt is on Tasigna 283m BID      Esophagitis and diarrhea d/t GVH dz    Seems to have improved.    Pt is on Compazine PRN      Oral thrush.   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   Replete and recheck   Pt noted she gets routine IV Mag   Continue PO magOx.      Chronic pain   APAP and Oxycodone PRN      DVT Prophylaxis.   SCDs   No heparin due to plt <100      Code status. Full code.      Disposition.   S/p TEE 8/20 which was neg for vegetations   Continue abx per ID   PICC line planned   Will plan to d/c home possibly 8/23 and [place]   Possible d/c home Friday if BCx  ok and PICC placed      ------------------      36 minutes were spent on the patient of which more than 50% was spent in coordination of care and counseling (time spent with patient/family face to face, physical exam, reviewing laboratory and imaging investigations, speaking with physicians and nursing  staff involved in this patient's care)      ------------------   Physical exam:   General:   Alert, NAD, pleasant.  Cooperative   HEENT: Sclera anicteric, PERRL, OM moist, throat clear. Oral thrush   Chest:   CTA B, no wheeze, no rales, no rhonchi.  Good air movement.   CV: RRR, S1/S2 were normal, no murmur   Abdomen: NTND, soft, NABS, no masses were noted.  No rebound, no guarding.   Extremities: No  edema.        Subjective:     Pt noted that she feels okay.  We discussed the plans for monitoring BCx and possible PICC Friday if neg.  No Cp, no f/C, no abd pain        Objective:        Visit Vitals      BP  124/63 (BP 1 Location: Left arm, BP Patient Position: Supine)     Pulse  (!) 102     Temp  98.2 ??F (36.8 ??C)     Resp  17     Ht  5' (1.524 m)     Wt  67.5 kg (148 lb 13 oz)     SpO2  98%        BMI  22.63 kg/m??         O2 Device: Room air      Temp (24hrs), Avg:98.7 ??F (37.1 ??C), Min:98.2 ??F (36.8 ??C), Max:99.7 ??F (37.6 ??C)     No intake/output data recorded.    08/21 0701 - 08/22 1900   In: 730 [P.O.:480; I.V.:250]   Out: -            Data Review:          Recent Days:     Recent Labs           01/01/18   0327     WBC  4.4      HGB  9.6*     HCT  28.5*        PLT  115*          Recent Labs           01/01/18   0327     NA  144     K  4.9     CL  110*     CO2  27     GLU  198*     BUN  14     CREA  1.0     CA  8.6     MG  1.3*     PHOS  3.2        ALB  3.0*        No results for input(s): PH, PCO2, PO2, HCO3, FIO2 in the last 72 hours.      24 Hour Results:     Recent Results (from the past 24 hour(s))     GLUCOSE, POC          Collection Time: 12/31/17  9:58 PM         Result  Value  Ref Range  Glucose (POC)  187 (H)  65 - 105 mg/dL       GLUCOSE, POC          Collection Time: 01/01/18 12:12 AM         Result  Value  Ref Range            Glucose (POC)  270 (H)  65 - 105 mg/dL       RENAL FUNCTION PANEL          Collection Time: 01/01/18  3:27 AM         Result  Value  Ref Range            Sodium  144  136 - 145 mEq/L            Potassium  4.9  3.5 - 5.1 mEq/L       Chloride  110 (H)  98 - 107 mEq/L       CO2  27  21 - 32 mEq/L       Glucose  198 (H)  74 - 106 mg/dl       BUN  14  7 - 25 mg/dl       Creatinine  1.0  0.6 - 1.3 mg/dl       GFR est AA  >60.0          GFR est non-AA  >60          Calcium  8.6  8.5 - 10.1 mg/dl       Albumin  3.0 (L)  3.4 - 5.0 gm/dl       Phosphorus  3.2  2.5 - 4.9 mg/dl       Anion gap  7  5 - 15 mmol/L       MAGNESIUM          Collection Time: 01/01/18  3:27 AM         Result  Value  Ref Range            Magnesium  1.3 (L)  1.6 - 2.6 mg/dl       CBC WITH AUTOMATED DIFF          Collection Time: 01/01/18  3:27 AM         Result  Value  Ref Range            WBC  4.4  4.0 - 11.0 1000/mm3       NEUTROPHILS  64.4 (H)  34 - 64 %       LYMPHOCYTES  15.4 (L)  28 - 48 %       RBC  2.79 (L)  3.60 - 5.20 M/uL       MONOCYTES  14.4 (H)  1 - 13 %       HGB  9.6 (L)  13.0 - 17.2 gm/dl       HCT  28.5 (L)  37.0 - 50.0 %       BASOPHILS  1.0  0 - 3 %       MCV  102.2 (H)  80.0 - 98.0 fL       BAND NEUTROPHILS  3.8  0 - 11 %       MCH  34.4  25.4 - 34.6 pg       MCHC  33.7  30.0 - 36.0 gm/dl       MYELOCYTES  1.0  (H)  0 - 0 %       PLATELET  115 (L)  140 - 450 1000/mm3       MPV  10.5 (H)  6.0 - 10.0 fL       RDW-SD  61.3 (H)  36.4 - 46.3         Anisocytosis  1          Polychromasia  1          Macrocytes  1          PLATELET COMMENTS  DECREASED          GLUCOSE, POC          Collection Time: 01/01/18 10:46 AM         Result  Value  Ref Range            Glucose (POC)  191 (H)  65 - 105 mg/dL       GLUCOSE, POC          Collection Time: 01/01/18  2:56 PM         Result  Value  Ref Range            Glucose (POC)  349 (H)  65 - 105 mg/dL       GLUCOSE, POC          Collection Time: 01/01/18  4:31 PM         Result  Value  Ref Range            Glucose (POC)  305 (H)  65 - 105 mg/dL           Xr Chest Pa Lat      Result Date: 12/24/2017   Chest Indication: hx of GVHD with N/V/D.    Comparison: 05/20/2017 Findings: Frontal and lateral views of the chest demonstrate that the cardiac silhouette is not enlarged.  There is no focal infiltrate, pleural effusion, vascular congestion, or pneumothorax.   Left PICC with tip overlying the distal superior vena cava.       Impression: No acute cardiopulmonary disease.       Mri Brain Wo Cont      Result Date: 12/26/2017   EXAMINATION: MRI BRAIN WO CONT CLINICAL INDICATION: Diplopia; TIA bone marrow transplant. Blurred vision COMPARISON:  None. TECHNIQUE: T1 and T2-weighted sequences of the brain were obtained in multiple planes without contrast. FINDINGS: No restricted  diffusion. No masses, no midline shift. No hydrocephalus. No extra-axial fluid collections. Visualized portions of the orbits grossly unremarkable. Mastoid air cells well aerated. Visualized portions of paranasal sinuses unremarkable. Intracranial flow  voids of great vessels, grossly intact. Diffuse low signal marrow. Age-appropriate atrophy. No susceptibility artifact on gradient echo imaging to suggest hemorrhage. Cerebellar tonsil extends into the foramen magnum 2 mm or so. Pituitary gland normal.  Orbits appear normal. No  compression of optic chiasm extraocular muscles are normal in size. No orbital masses. Several foci of T2 prolongation in corona radiata and centrum semiovale. Largest in the right frontal lobe measures 8 mm and in the left parietal  lobe millimeters 10 mm. Several additional smaller lesions are noted in the frontal and parietal lobes.       IMPRESSION: 1.   Nonspecific white matter hyperintensities likely due to chronic microvascular ischemic changes. Iatrogenic drug toxicity, graft-versus-host, inflammatory/infectious, hypertension, demyelinating process, trauma, chronic headaches, sequelae  of vasculitis are other considerations. 2. Low-lying cerebellar tonsils. 3. No orbital masses. No infarction.       Mri Brain W Cont      Result Date: 12/27/2017  MRI OF THE BRAIN INDICATION: diplopia.    COMPARISON: Noncontrast MRI 12/26/2017 TECHNIQUE: Multiplanar, multisequence MRI of the brain was performed without intravenous contrast.  FINDINGS: The patient returned for contrast-enhanced imaging of the brain.  Images are degraded by patient motion artifacts. However, there is no abnormal parenchymal, dural, or meningeal enhancement. No gross sellar mass.       IMPRESSION: 1.  No abnormal enhancement to suggest active infectious/inflammatory/neoplastic process       Ct Head Wo Cont      Result Date: 12/25/2017   DICOM format image data is available to non-affiliated external healthcare facilities or entities on a secure, media free, reciprocally searchable basis with patient authorization for 12 months following the date of the study. Clinical history: Diplopia  EXAMINATION: Noncontrast CT scan of the head 12/24/2017. 4 mm axial scanning is performed from the skull base to the vertex. Coronal and sagittal reconstruction imaging has been obtained. Correlation: None FINDINGS: Visualized paranasal sinuses and mastoid  air cells are clear. No acute intracranial hemorrhage, mass or infarction.       IMPRESSION: Normal unenhanced  CT scan of the head. Initial interpretation: [facility]       Cta Head      Result Date: 12/26/2017   CTA OF THE HEAD INDICATION: Nausea, sweating, vomiting, diarrhea COMPARISON: MRI 12/26/2017, CT 12/24/2017 TECHNIQUE: Noncontrast CT the head performed followed by CTA of the head with intravenous contrast. Multiplanar two-dimensional and three-dimensional  maximum intensity projection reformats performed and reviewed. DICOM format image data is available to non-affiliated external healthcare facilities or entities on a secure, media free, reciprocally searchable basis with patient authorization for 12 months  following the date of the study. FINDINGS: Noncontrast CT of the head demonstrates no acute segmental infarct, intracranial hemorrhage, mass, midline shift, extra-axial fluid collection, or hydrocephalus. The vertebral arteries are codominant. There is  no focal hemodynamically significant stenosis within the anterior or posterior circulation. The anterior and posterior communicating arteries appear patent. There is no definitive aneurysm.       IMPRESSION: No focal hemodynamically significant stenosis.       Cta Neck      Result Date: 12/26/2017   CTA OF THE NECK WITH CONTRAST INDICATION: Speech difficulty, and balance COMPARISON: No relevant studies. TECHNIQUE: CTA of the neck was performed with nonionic intravenous contrast with multiplanar 2-dimensional and maximum intensity projection three-dimensional  images. Stenosis was evaluated using the NASCET criteria. DICOM format image data is available to non-affiliated external healthcare facilities or entities on a secure, media free, reciprocally searchable basis with patient authorization for 12 months  following the date of the study. FINDINGS: There is no hemodynamically significant stenosis within the carotid or vertebral arteries. There is no evidence of dissection. CAROTID STENOSIS REFERENCE USING NASCET CRITERIA % Stenosis = (1 - narrowest  diameter/diameter  of distal artery) x100 Mild: < 50% stenosis Moderate: 50-69% stenosis. Severe: 70-94% stenosis. Near occlusion: 95-99% stenosis. Occluded: 100% stenosis       IMPRESSION: No focal hemodynamically significant stenosis.          Microbiology:     All Micro Results               Procedure  Component  Value  Units  Date/Time           CULTURE, BLOOD [[id number]]  Collected:  12/30/17 1820            Order Status:  Completed  Specimen:  Blood from [facility]  Updated:  01/01/18 0716                Blood Culture Result  No Growth At 24 Hours                CULTURE, BLOOD [[id number]]  Collected:  12/30/17 1825            Order Status:  Completed  Specimen:  Blood from [facility]  Updated:  01/01/18 0716                Blood Culture Result  No Growth At 24 Hours                CULTURE, BLOOD [[id number]]  Collected:  12/28/17 0249            Order Status:  Completed  Specimen:  Blood  Updated:  01/01/18 0716                Blood Culture Result  No Growth at 4 days                CULTURE, BLOOD [[id number]]  (Abnormal)  Collected:  12/28/17 0249            Order Status:  Completed  Specimen:  Blood  Updated:  12/31/17 0721                Blood Culture Result  --                    Possible contaminant. No further workup will be performed unless directly requested to the Microbiology department at ext. [phone] with 72  hours of this report.   Gram stain Gram Positive Cocci In Clusters   Called to/read back DEREK BOZMAN 6EST   on 12/29/2017   at 0600   by VGP                    Isolate                 Coagulase Negative Staphylococcus                       CULTURE, CATHETER TIP [[id number]]  (Abnormal)  (Susceptibility)  Collected:  12/27/17 1550            Order Status:  Completed  Specimen:  Catheter Tip from PICC  Updated:  12/30/17 1106                Isolate  --                    >15 CFU   Staphylococcus epidermidis                CULTURE, BLOOD [[id number]]  Collected:  12/29/17 1830            Order Status:   Canceled  Specimen:  Blood             CULTURE, BLOOD [[id number]]  Collected:  12/29/17 1830            Order Status:  Canceled  Specimen:  Blood             CULTURE, BLOOD [[id number]]  (Abnormal)  Collected:  12/26/17 1240            Order Status:  Completed  Specimen:  Blood from [facility]  Updated:  12/28/17 0915  Blood Culture Result                 Gram stain Gram Positive Cocci In Clusters                          Isolate  --                    Staphylococcus epidermidis   See Previous Culture For Susceptibility Report                CULTURE, BLOOD [[id number]]  (Abnormal)  Collected:  12/26/17 1246            Order Status:  Completed  Specimen:  Blood from PICC  Updated:  12/28/17 0913                Blood Culture Result  --                  Gram stain Gram Positive Cocci In Clusters   Called to/read back sissy   on 12/27/17   at [place]   by [id number]                   Isolate  --                    Staphylococcus epidermidis   See Previous Culture For Susceptibility Report                CULTURE, BLOOD [[id number]]  (Abnormal)  Collected:  12/24/17 2255            Order Status:  Completed  Specimen:  Blood from [facility]  Updated:  12/27/17 0910                Blood Culture Result  --                  Gram stain Gram Positive Cocci In Clusters   Called to/read back jannet   on 12/25/17   at 1751   by [id number]                   Isolate  --                    See Previous Culture For Susceptibility Report   Staphylococcus epidermidis                CULTURE, BLOOD [[id number]]  (Abnormal)  (Susceptibility)  Collected:  12/24/17 2230            Order Status:  Completed  Specimen:  Blood from [facility] Picc  Updated:  12/27/17 0749                Blood Culture Result  --                  Gram stain Gram Positive Cocci In Clusters   Called to/read back jannet   on 12/25/17   at 1751   by [id number]                  Isolate                 Staphylococcus epidermidis                       CULTURE, BLOOD FOR MRSA/SA BY PCR  [[id number]]  Collected:  12/24/17 0951  Order Status:  Completed  Specimen:  Blood  Updated:  12/26/17 1133                Blood culture, MRSA by PCR  NEGATIVE              Blood culture, S. aureus by PCR  NEGATIVE              Positive culture accn number  [id number]                  Comment:  This PCR testing was performed from the blood culture accession number that is noted. Refer to   this accession number for antibiotic susceptibility testing and final identification.                        CULTURE, URINE [[id number]]  (Abnormal)  Collected:  12/24/17 2255            Order Status:  Completed  Specimen:  Urine from Clean catch  Updated:  12/26/17 1021                Culture result  --                    30,000 CFU/mL   Mixed Flora, three or more different organisms present   No Further Workup                      Cardiology:     Results for orders placed or performed during the hospital encounter of 12/24/17     EKG, 12 LEAD, INITIAL         Result  Value  Ref Range            Ventricular Rate  104  BPM       Atrial Rate  104  BPM       P-R Interval  148  ms       QRS Duration  66  ms       Q-T Interval  328  ms       QTC Calculation (Bezet)  431  ms       Calculated P Axis  18  degrees       Calculated R Axis  17  degrees       Calculated T Axis  16  degrees       Diagnosis                 Sinus tachycardia   Otherwise normal ECG   No previous ECGs available   Confirmed by Marrion Coy, M.D., Ellin Saba. (30) on 12/25/2017 7:41:35 AM                 Problem List:      Problem List  as of 01/01/2018  Date Reviewed:  01/16/2018                        Codes  Class  Noted - Resolved             Bacteremia  ICD-10-CM: R78.81   ICD-9-CM: 790.7    12/26/2017 - Present                       TIA (transient ischemic attack)  ICD-10-CM: G45.9   ICD-9-CM: 435.9    12/25/2017 - Present  Thrombocytopenia ([place])  ICD-10-CM: D69.6   ICD-9-CM: 287.5    05/13/2017 - Present                       Neutropenic fever  ([place])  ICD-10-CM: D70.9, R50.81   ICD-9-CM: 288.00, 780.61    05/13/2017 - Present                       Chest pressure  ICD-10-CM: R07.89   ICD-9-CM: 786.59    04/25/2017 - Present                       Elevated troponin  ICD-10-CM: R74.8   ICD-9-CM: 790.6    04/25/2017 - Present                          Medications reviewed     Current Facility-Administered Medications          Medication  Dose  Route  Frequency           ?  aspirin chewable tablet 81 mg   81 mg  Oral  DAILY     ?  magnesium sulfate 4 g/100 mL IVPB   4 g  IntraVENous  Q6H     ?  benzonatate (TESSALON) capsule 100 mg   100 mg  Oral  TID PRN     ?  vancomycin (VANCOCIN) 1,250 mg in 0.9% sodium chloride 500 mL IVPB   1,250 mg  IntraVENous  Q12H     ?  benzocaine-menthol (CEPACOL) lozenge 1 Lozenge   1 Lozenge  Mucous Membrane  PRN           ?  albuterol (PROVENTIL VENTOLIN) nebulizer solution 2.5 mg   2.5 mg  Nebulization  Q4H PRN           ?  0.9% sodium chloride infusion   83 mL/hr  IntraVENous  CONTINUOUS     ?  fentaNYL citrate (PF) injection 12.5-50 mcg   12.5-50 mcg  IntraVENous  PRN     ?  midazolam (VERSED) injection 0.25-2 mg   0.25-2 mg  IntraVENous  PRN     ?  nystatin (MYCOSTATIN) 100,000 unit/mL oral suspension 500,000 Units   500,000 Units  Oral  QID     ?  sodium chloride (NS) flush 10-20 mL   10-20 mL  InterCATHeter  PRN     ?  heparin (porcine) pf 50 Units   50 Units  InterCATHeter  PRN     ?  DULoxetine (CYMBALTA) capsule 20 mg   20 mg  Oral  DAILY     ?  magnesium oxide (MAG-OX) tablet 400 mg   400 mg  Oral  BID     ?  pantoprazole (PROTONIX) tablet 40 mg   40 mg  Oral  DAILY     ?  acyclovir (ZOVIRAX) tablet 400 mg   400 mg  Oral  BID     ?  cholecalciferol (VITAMIN D3) tablet 5,000 Units   5,000 Units  Oral  DAILY     ?  dextrose (D50) infusion 5-25 g   10-50 mL  IntraVENous  PRN     ?  glucagon (GLUCAGEN) injection 1 mg   1 mg  IntraMUSCular  PRN     ?  insulin glargine (LANTUS) injection 1-100 Units   1-100 Units  SubCUTAneous   QHS     ?  insulin lispro (HUMALOG) injection 1-100 Units   1-100 Units  SubCUTAneous  AC&HS     ?  insulin lispro (HUMALOG) injection 1-100 Units   1-100 Units  SubCUTAneous  PRN     ?  oxyCODONE IR (ROXICODONE) tablet 10 mg   10 mg  Oral  Q4H PRN     ?  potassium chloride (K-DUR, KLOR-CON) SR tablet 40 mEq   40 mEq  Oral  BID           ?  tacrolimus (PROGRAF) capsule 4 mg   4 mg  Oral  BID           ?  prochlorperazine (COMPAZINE) tablet 10 mg   10 mg  Oral  Q4H PRN     ?  zolpidem (AMBIEN) tablet 5 mg   5 mg  Oral  QHS PRN     ?  trimethoprim-sulfamethoxazole (BACTRIM DS, SEPTRA DS) 160-800 mg per tablet 1 Tab   1 Tab  Oral  Q MON, WED & FRI     ?  naloxone (NARCAN) injection 0.1 mg   0.1 mg  IntraVENous  PRN     ?  acetaminophen (TYLENOL) tablet 650 mg   650 mg  Oral  Q4H PRN          Or           ?  acetaminophen (TYLENOL) solution 650 mg   650 mg  Oral  Q4H PRN          Or           ?  acetaminophen (TYLENOL) suppository 650 mg   650 mg  Rectal  Q4H PRN     ?  nilotinib (TASIGNA) capsule cap 200 mg  (Patient Supplied)   200 mg  Oral  BID     ?  LORazepam (ATIVAN) tablet 1 mg   1 mg  Oral  ON CALL     ?  budesonide (ENTOCORT EC) capsule 6 mg   6 mg  Oral  TID           ?  *Pharmacy dosing vancomycin   1 Each  Other  Rx Dosing/Monitoring              Kristen Loader, MD

## 2018-01-01 NOTE — Progress Notes (Signed)
Per Izora Gala liaison at [place] [facility] is the provider, notified her of tentative Friday dc.

## 2018-01-01 NOTE — Progress Notes (Signed)
Progress  Notes by Jearld Adjutant, MD at 01/01/18 1151                Author: Jearld Adjutant, MD  Service: Infectious Disease  Author Type: Physician       Filed: 01/02/18 1707  Date of Service: 01/01/18 1151  Status: Signed          Editor: Jearld Adjutant, MD (Physician)                            Infectious Disease Follow-up       Admit Date: 12/24/2017      Today's Date: 01/01/18         Current abx  Prior abx        Prophylactic Bactrim tiw, ACV; vancomycin  8/16-6  meropenem 8/16-1          Assessment:          -MRSE BSI- CA       picc infection - + catheter tip       TEE neg for IE-- appreciate cardiology assist    bl cx 8/20 remain ngtd   -TTE no reported vegetation, bubble study negative      AML sp chemo/stem cell xplant   -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[place], last ~1 mo ago per Tan's note --[phone]   -on tassigra comp GVH skin, esophagus    -bactrim tiw, acyclovir prophylaxis   -pancytopenia ch thrombocytopenia, not neutropenic (ANC 8/15 1800)      L numbness, weakness n/v vision change diplopia began Monday 8/12- CTH neg    resolved        GVHD- esophagitis/ diarrhea      improved      thrush - improved     DM on insulin        HTN        PCN allergy             Rec:     ->continue iv vancomycin   ->monitor picc tip from 8/17 - positive for MRSE   ->monitor repeat bctx's from 8/18- now 1 of 2 + for MRSE   -> TEE 8/20 neg      ->  Repeat bl cx x 2  From 8/20  to confirm clearance of bacteremia.         If 8/20 bl cx neg after 2- days may replace picc -- pt says need iv access 2x/week;         If remain neg@48  h, ok to place picc        Will be 48 hrs at 1825 today       OPAT:   ALL sp chemo / Stem cell tx   MRSE CA BSI   Routine picc care per protocol   Vanco dosing per pharmacy- vanco trough goal between 15-20   vanco through sept 3   Monday labs: cbc/diff, bpm fax to  [phone]   Fu with ID 1 week post dc - will arrange        Ok to dc when PICC  placed   Discussed with PT, dc planner and care team      -> reviewed repeat MRI - neg       defer w/u for diplopia to Neurology, appears holding on LP for now, pt says better this am    Discussed plan with Dr  Munoz and pt- at length.  I let ID office know to schedule her for fu appt     MICROBIOLOGY:     8/14 bctx x 2 MRSE    uctx ngtd   8/16 bctx x 2 S epi see prior   8/17     Cath tip > S epi   8/18 bctx x 2 ip- 1 of 2  G+ cocci        LINES AND CATHETERS:     PICC- dc'ed  8/17   piv intact        Active Hospital Problems           Diagnosis  Date Noted         ?  Bacteremia  12/26/2017         ?  TIA (transient ischemic attack)  12/25/2017                Subjective:        Interval notes reviewed. Pt says feeling better, stinging with vanco infusion.  PT many questions today-answered and discussed plan as outlined above.  Mouth bettter        Current Facility-Administered Medications          Medication  Dose  Route  Frequency           ?  aspirin chewable tablet 81 mg   81 mg  Oral  DAILY     ?  vancomycin (VANCOCIN) 1,250 mg in 0.9% sodium chloride 500 mL IVPB   1,250 mg  IntraVENous  Q12H     ?  benzocaine-menthol (CEPACOL) lozenge 1 Lozenge   1 Lozenge  Mucous Membrane  PRN     ?  albuterol (PROVENTIL VENTOLIN) nebulizer solution 2.5 mg   2.5 mg  Nebulization  Q4H PRN     ?  0.9% sodium chloride infusion   83 mL/hr  IntraVENous  CONTINUOUS           ?  fentaNYL citrate (PF) injection 12.5-50 mcg   12.5-50 mcg  IntraVENous  PRN           ?  midazolam (VERSED) injection 0.25-2 mg   0.25-2 mg  IntraVENous  PRN     ?  nystatin (MYCOSTATIN) 100,000 unit/mL oral suspension 500,000 Units   500,000 Units  Oral  QID     ?  sodium chloride (NS) flush 10-20 mL   10-20 mL  InterCATHeter  PRN     ?  heparin (porcine) pf 50 Units   50 Units  InterCATHeter  PRN     ?  DULoxetine (CYMBALTA) capsule 20 mg   20 mg  Oral  DAILY     ?  magnesium oxide (MAG-OX) tablet 400 mg   400 mg  Oral  BID     ?  pantoprazole (PROTONIX)  tablet 40 mg   40 mg  Oral  DAILY     ?  acyclovir (ZOVIRAX) tablet 400 mg   400 mg  Oral  BID     ?  cholecalciferol (VITAMIN D3) tablet 5,000 Units   5,000 Units  Oral  DAILY     ?  dextrose (D50) infusion 5-25 g   10-50 mL  IntraVENous  PRN     ?  glucagon (GLUCAGEN) injection 1 mg   1 mg  IntraMUSCular  PRN     ?  insulin glargine (LANTUS) injection 1-100 Units   1-100 Units  SubCUTAneous  QHS     ?  insulin lispro (HUMALOG) injection 1-100 Units   1-100 Units  SubCUTAneous  AC&HS     ?  insulin lispro (HUMALOG) injection 1-100 Units   1-100 Units  SubCUTAneous  PRN     ?  oxyCODONE IR (ROXICODONE) tablet 10 mg   10 mg  Oral  Q4H PRN     ?  potassium chloride (K-DUR, KLOR-CON) SR tablet 40 mEq   40 mEq  Oral  BID     ?  tacrolimus (PROGRAF) capsule 4 mg   4 mg  Oral  BID     ?  prochlorperazine (COMPAZINE) tablet 10 mg   10 mg  Oral  Q4H PRN     ?  zolpidem (AMBIEN) tablet 5 mg   5 mg  Oral  QHS PRN     ?  trimethoprim-sulfamethoxazole (BACTRIM DS, SEPTRA DS) 160-800 mg per tablet 1 Tab   1 Tab  Oral  Q MON, WED & FRI     ?  naloxone (NARCAN) injection 0.1 mg   0.1 mg  IntraVENous  PRN     ?  acetaminophen (TYLENOL) tablet 650 mg   650 mg  Oral  Q4H PRN          Or           ?  acetaminophen (TYLENOL) solution 650 mg   650 mg  Oral  Q4H PRN          Or           ?  acetaminophen (TYLENOL) suppository 650 mg   650 mg  Rectal  Q4H PRN     ?  nilotinib (TASIGNA) capsule cap 200 mg  (Patient Supplied)   200 mg  Oral  BID     ?  LORazepam (ATIVAN) tablet 1 mg   1 mg  Oral  ON CALL     ?  budesonide (ENTOCORT EC) capsule 6 mg   6 mg  Oral  TID           ?  *Pharmacy dosing vancomycin   1 Each  Other  Rx Dosing/Monitoring                Objective:        Visit Vitals      BP  127/73 (BP 1 Location: Left arm, BP Patient Position: Sitting)     Pulse  84     Temp  98.2 ??F (36.8 ??C)     Resp  16     Ht  5' (1.524 m)     Wt  67.5 kg (148 lb 13 oz)     SpO2  100%        BMI  22.63 kg/m??           Temp (24hrs), Avg:98.8 ??F  (37.1 ??C), Min:98.2 ??F (36.8 ??C), Max:99.7 ??F (37.6 ??C)      GEN: WFWN BF appears stated age lying in bed NAD    HEENT: anicteric sclerae moist orophyarynx   NECK: Supple    CHEST: no rales rhonchi or wheeze   CVS: S1'S2' no rub murmur or gallop   ABD: soft non-tender (+) BS no masses non-distended   EXT: no edema LUE picc out          Labs:  Results:        Chemistry  Recent Labs         01/01/18   0327      GLU  198*  NA  144      K  4.9      CL  110*      CO2  27      BUN  14      CREA  1.0      CA  8.6      AGAP  7      ALB  3.0*                 CBC w/Diff  Recent Labs         01/01/18   0327      WBC  4.4      RBC  2.79*      HGB  9.6*      HCT  28.5*      PLT  115*      GRANS  64.4*      LYMPH  15.4*                 Mri brain 8/16 IMPRESSION:   1.   Nonspecific white matter hyperintensities likely due to chronic   microvascular ischemic changes. 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   2. Low-lying cerebellar tonsils.   3. No orbital masses. No infarction      8/17 mri IMPRESSION:   ??   1.  No abnormal enhancement to suggest active infectious/inflammatory/neoplastic   process      Echo 8/16 Interpretation Summary  A complete two-dimensional transthoracic echocardiogram was performed (2D, M-  mode, Doppler and color flow Doppler).  The study was technically adequate.    Small left ventricle, norla wall thickness  with normal systolic function and   a  calculated ejectio fraction of 77%.  No regional wall motion abnormalites are noted.  Grade 1 diastolic dysfunction.  Normal size right ventricle, normal systolic function.  No hemodynamic  valvular pathology or signficiant regurgitation.  Estimated right ventricular systolic pressure: 26STMH.  Negative bubble study for interatrial shunting.         35 min spent with > 50% spent on counseling and coordination of  care      Collene Mares, MD   January 01, 2018   [facility]   [phone]

## 2018-01-01 NOTE — Progress Notes (Signed)
Paged PICC:  PT: [name] Wu  RM: [phone]  Pt c/o pain to PIV in Left hand. Requests new placement. Poor vascular status. Needs for IV Mag, then Vanc.  Thank you,  Museum/gallery conservator Smith,RN

## 2018-01-01 NOTE — Progress Notes (Signed)
Hospitalist Progress Note  Kristen Loader, MD  [facility] Hospitalists    Daily Progress Note: 01/01/2018    Assessment/Plan:     Probable TIA  Pt has multiple positive BCx with Staph epi.  PICC tip was positive for S epi.  Pt with TIA and faint murmur on exam.  TEE 8/20 was negative for vegetations.   BE ruled out  Neuro rec ASA if not contraindicated (pt with some thrombocytopenia)    Staph epidermidis Sepsis with CLABSI  Immunocompromised pt  TEE 8/20 was negative for vegetations  ID is following  Pt is on Bactrim TIW and Vancomycin  BCx 8/20 NGTD, d/w ID, that if neg by tomorrow afternoon, then pt can get PICC on Friday and plan on d/c home at that point with OPAT ([place])    Diplopia with transient slurred speech, LUE weakness, gait disturbances.   Probable TIA  Work-up as above  Sx have resolved.  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  I spoke with her lead BMT oncologist and he noted to continue treatment here for infxn and once stable for discharge to d/c home so she can go to [place] on her own.  Pt is on Tasigna 275m BID    Esophagitis and diarrhea d/t GVH dz   Seems to have improved.   Pt is on Compazine PRN    Oral thrush.  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  Replete and recheck  Pt noted she gets routine IV Mag  Continue PO magOx.    Chronic pain  APAP and Oxycodone PRN    DVT Prophylaxis.  SCDs  No heparin due to plt <100    Code status. Full code.    Disposition.  S/p TEE 8/20 which was neg for vegetations  Continue abx per ID  PICC line planned   Will plan to d/c home possibly 8/23 and [place]  Possible d/c home Friday if BCx ok and PICC placed    ------------------    36 minutes were spent on the patient of which more than 50% was spent in coordination of care and counseling (time spent with patient/family face to face, physical exam, reviewing laboratory and imaging investigations, speaking with physicians and nursing staff involved in this patient's care)    ------------------  Physical exam:  General:  Alert, NAD, pleasant.  Cooperative  HEENT: Sclera anicteric, PERRL, OM moist, throat clear. Oral thrush  Chest:  CTA B, no wheeze, no rales, no rhonchi.  Good air movement.  CV: RRR, S1/S2 were normal, no murmur  Abdomen: NTND, soft, NABS, no masses were noted.  No rebound, no guarding.  Extremities: No edema.    Subjective:   Pt noted that she feels okay.  We discussed the plans for monitoring BCx and possible PICC Friday if neg.  No Cp, no f/C, no abd pain    Objective:     Visit Vitals  BP 124/63 (BP 1 Location: Left arm, BP Patient Position: Supine)  Pulse (!) 102   Temp 98.2 ??F (36.8 ??C)   Resp 17   Ht 5' (1.524 m)   Wt 67.5 kg (148 lb 13 oz)   SpO2 98%   BMI 22.63 kg/m??      O2 Device: Room air    Temp (24hrs), Avg:98.7 ??F (37.1 ??C), Min:98.2 ??F (36.8 ??C), Max:99.7 ??F (37.6 ??C)    No intake/output data recorded.   08/21 0701 - 08/22 1900  In: 730 [P.O.:480; I.V.:250]  Out: -       Data Review:       Recent Days:  Recent Labs     01/01/18  0327   WBC 4.4   HGB 9.6*   HCT 28.5*   PLT 115*     Recent Labs     01/01/18  0327   NA 144   K 4.9   CL 110*   CO2 27   GLU 198*   BUN 14   CREA 1.0   CA 8.6   MG 1.3*   PHOS 3.2   ALB 3.0*     No results for input(s): PH, PCO2, PO2, HCO3, FIO2 in the last 72 hours.    24 Hour Results:  Recent Results (from the past 24 hour(s))   GLUCOSE, POC    Collection Time: 12/31/17  9:58 PM   Result Value Ref Range    Glucose (POC) 187 (H) 65 - 105 mg/dL   GLUCOSE, POC     Collection Time: 01/01/18 12:12 AM   Result Value Ref Range    Glucose (POC) 270 (H) 65 - 105 mg/dL   RENAL FUNCTION PANEL    Collection Time: 01/01/18  3:27 AM   Result Value Ref Range    Sodium 144 136 - 145 mEq/L    Potassium 4.9 3.5 - 5.1 mEq/L    Chloride 110 (H) 98 - 107 mEq/L    CO2 27 21 - 32 mEq/L    Glucose 198 (H) 74 - 106 mg/dl    BUN 14 7 - 25 mg/dl    Creatinine 1.0 0.6 - 1.3 mg/dl    GFR est AA >60.0      GFR est non-AA >60      Calcium 8.6 8.5 - 10.1 mg/dl    Albumin 3.0 (L) 3.4 - 5.0 gm/dl    Phosphorus 3.2 2.5 - 4.9 mg/dl    Anion gap 7 5 - 15 mmol/L   MAGNESIUM    Collection Time: 01/01/18  3:27 AM   Result Value Ref Range    Magnesium 1.3 (L) 1.6 - 2.6 mg/dl   CBC WITH AUTOMATED DIFF    Collection Time: 01/01/18  3:27 AM   Result Value Ref Range    WBC 4.4 4.0 - 11.0 1000/mm3    NEUTROPHILS 64.4 (H) 34 - 64 %    LYMPHOCYTES 15.4 (L) 28 - 48 %    RBC 2.79 (L) 3.60 - 5.20 M/uL    MONOCYTES 14.4 (H) 1 - 13 %    HGB 9.6 (L) 13.0 - 17.2 gm/dl    HCT 28.5 (L) 37.0 - 50.0 %    BASOPHILS 1.0 0 - 3 %    MCV 102.2 (H) 80.0 - 98.0 fL    BAND NEUTROPHILS 3.8 0 - 11 %    MCH 34.4 25.4 - 34.6 pg    MCHC 33.7 30.0 - 36.0 gm/dl    MYELOCYTES 1.0 (H) 0 - 0 %    PLATELET 115 (L) 140 -  450 1000/mm3    MPV 10.5 (H) 6.0 - 10.0 fL    RDW-SD 61.3 (H) 36.4 - 46.3      Anisocytosis 1      Polychromasia 1      Macrocytes 1      PLATELET COMMENTS DECREASED     GLUCOSE, POC    Collection Time: 01/01/18 10:46 AM   Result Value Ref Range    Glucose (POC) 191 (H) 65 - 105 mg/dL   GLUCOSE, POC    Collection Time: 01/01/18  2:56 PM   Result Value Ref Range    Glucose (POC) 349 (H) 65 - 105 mg/dL   GLUCOSE, POC    Collection Time: 01/01/18  4:31 PM   Result Value Ref Range    Glucose (POC) 305 (H) 65 - 105 mg/dL       Xr Chest Pa Lat    Result Date: 12/24/2017  Chest Indication: hx of GVHD with N/V/D.    Comparison: 05/20/2017 Findings: Frontal and lateral views of the chest demonstrate that the cardiac  silhouette is not enlarged.  There is no focal infiltrate, pleural effusion, vascular congestion, or pneumothorax.  Left PICC with tip overlying the distal superior vena cava.     Impression: No acute cardiopulmonary disease.     Mri Brain Wo Cont    Result Date: 12/26/2017  EXAMINATION: MRI BRAIN WO CONT CLINICAL INDICATION: Diplopia; TIA bone marrow transplant. Blurred vision COMPARISON:  None. TECHNIQUE: T1 and T2-weighted sequences of the brain were obtained in multiple planes without contrast. FINDINGS: No restricted diffusion. No masses, no midline shift. No hydrocephalus. No extra-axial fluid collections. Visualized portions of the orbits grossly unremarkable. Mastoid air cells well aerated. Visualized portions of paranasal sinuses unremarkable. Intracranial flow voids of great vessels, grossly intact. Diffuse low signal marrow. Age-appropriate atrophy. No susceptibility artifact on gradient echo imaging to suggest hemorrhage. Cerebellar tonsil extends into the foramen magnum 2 mm or so. Pituitary gland normal. Orbits appear normal. No compression of optic chiasm extraocular muscles are normal in size. No orbital masses. Several foci of T2 prolongation in corona radiata and centrum semiovale. Largest in the right frontal lobe measures 8 mm and in the left parietal lobe millimeters 10 mm. Several additional smaller lesions are noted in the frontal and parietal lobes.     IMPRESSION: 1.   Nonspecific white matter hyperintensities likely due to chronic microvascular ischemic changes. Iatrogenic drug toxicity, graft-versus-host, inflammatory/infectious, hypertension, demyelinating process, trauma, chronic headaches, sequelae of vasculitis are other considerations. 2. Low-lying cerebellar tonsils. 3. No orbital masses. No infarction.     Mri Brain W Cont    Result Date: 12/27/2017  MRI OF THE BRAIN INDICATION: diplopia.    COMPARISON: Noncontrast MRI  12/26/2017 TECHNIQUE: Multiplanar, multisequence MRI of the brain was performed without intravenous contrast.  FINDINGS: The patient returned for contrast-enhanced imaging of the brain. Images are degraded by patient motion artifacts. However, there is no abnormal parenchymal, dural, or meningeal enhancement. No gross sellar mass.     IMPRESSION: 1.  No abnormal enhancement to suggest active infectious/inflammatory/neoplastic process     Ct Head Wo Cont    Result Date: 12/25/2017  DICOM format image data is available to non-affiliated external healthcare facilities or entities on a secure, media free, reciprocally searchable basis with patient authorization for 12 months following the date of the study. Clinical history: Diplopia EXAMINATION: Noncontrast CT scan of the head 12/24/2017. 4 mm axial scanning is performed from the skull base to the  vertex. Coronal and sagittal reconstruction imaging has been obtained. Correlation: None FINDINGS: Visualized paranasal sinuses and mastoid air cells are clear. No acute intracranial hemorrhage, mass or infarction.     IMPRESSION: Normal unenhanced CT scan of the head. Initial interpretation: [facility]     Cta Head    Result Date: 12/26/2017  CTA OF THE HEAD INDICATION: Nausea, sweating, vomiting, diarrhea COMPARISON: MRI 12/26/2017, CT 12/24/2017 TECHNIQUE: Noncontrast CT the head performed followed by CTA of the head with intravenous contrast. Multiplanar two-dimensional and three-dimensional maximum intensity projection reformats performed and reviewed. DICOM format image data is available to non-affiliated external healthcare facilities or entities on a secure, media free, reciprocally searchable basis with patient authorization for 12 months following the date of the study. FINDINGS: Noncontrast CT of the head demonstrates no acute segmental infarct, intracranial hemorrhage, mass, midline shift, extra-axial fluid  collection, or hydrocephalus. The vertebral arteries are codominant. There is no focal hemodynamically significant stenosis within the anterior or posterior circulation. The anterior and posterior communicating arteries appear patent. There is no definitive aneurysm.     IMPRESSION: No focal hemodynamically significant stenosis.     Cta Neck    Result Date: 12/26/2017  CTA OF THE NECK WITH CONTRAST INDICATION: Speech difficulty, and balance COMPARISON: No relevant studies. TECHNIQUE: CTA of the neck was performed with nonionic intravenous contrast with multiplanar 2-dimensional and maximum intensity projection three-dimensional images. Stenosis was evaluated using the NASCET criteria. DICOM format image data is available to non-affiliated external healthcare facilities or entities on a secure, media free, reciprocally searchable basis with patient authorization for 12 months following the date of the study. FINDINGS: There is no hemodynamically significant stenosis within the carotid or vertebral arteries. There is no evidence of dissection. CAROTID STENOSIS REFERENCE USING NASCET CRITERIA % Stenosis = (1 - narrowest diameter/diameter of distal artery) x100 Mild: < 50% stenosis Moderate: 50-69% stenosis. Severe: 70-94% stenosis. Near occlusion: 95-99% stenosis. Occluded: 100% stenosis     IMPRESSION: No focal hemodynamically significant stenosis.       Microbiology:  All Micro Results     Procedure Component Value Units Date/Time    CULTURE, BLOOD [[id number]] Collected:  12/30/17 1820    Order Status:  Completed Specimen:  Blood from [facility] Picc Updated:  01/01/18 0716     Blood Culture Result No Growth At 24 Hours       CULTURE, BLOOD [[id number]] Collected:  12/30/17 1825    Order Status:  Completed Specimen:  Blood from [facility] Picc Updated:  01/01/18 0716     Blood Culture Result No Growth At 24 Hours       CULTURE, BLOOD [[id number]] Collected:  12/28/17 0249     Order Status:  Completed Specimen:  Blood Updated:  01/01/18 0716     Blood Culture Result No Growth at 4 days       CULTURE, BLOOD [[id number]]  (Abnormal) Collected:  12/28/17 0249    Order Status:  Completed Specimen:  Blood Updated:  12/31/17 0721     Blood Culture Result --        Possible contaminant. No further workup will be performed unless directly requested to the Microbiology department at ext. [phone] with 72 hours of this report.  Gram stain Gram Positive Cocci In Clusters  Called to/read back DEREK BOZMAN 6EST  on 12/29/2017  at 0600  by VGP       Isolate       Coagulase Negative Staphylococcus  CULTURE, CATHETER TIP [[id number]]  (Abnormal)  (Susceptibility) Collected:  12/27/17 1550    Order Status:  Completed Specimen:  Catheter Tip from PICC Updated:  12/30/17 1106     Isolate --        >15 CFU  Staphylococcus epidermidis      CULTURE, BLOOD [[id number]] Collected:  12/29/17 1830    Order Status:  Canceled Specimen:  Blood     CULTURE, BLOOD [[id number]] Collected:  12/29/17 1830    Order Status:  Canceled Specimen:  Blood     CULTURE, BLOOD [[id number]]  (Abnormal) Collected:  12/26/17 1240    Order Status:  Completed Specimen:  Blood from [facility] Picc Updated:  12/28/17 0915     Blood Culture Result       Gram stain Gram Positive Cocci In Clusters           Isolate --        Staphylococcus epidermidis  See Previous Culture For Susceptibility Report      CULTURE, BLOOD [[id number]]  (Abnormal) Collected:  12/26/17 1246    Order Status:  Completed Specimen:  Blood from PICC Updated:  12/28/17 0913     Blood Culture Result --        Gram stain Gram Positive Cocci In Clusters  Called to/read back sissy  on 12/27/17  at [place]  by [id number]       Isolate --        Staphylococcus epidermidis  See Previous Culture For Susceptibility Report      CULTURE, BLOOD [[id number]]  (Abnormal) Collected:  12/24/17 2255    Order Status:  Completed Specimen:  Blood from [facility] Picc Updated:  12/27/17 0910      Blood Culture Result --        Gram stain Gram Positive Cocci In Clusters  Called to/read back jannet  on 12/25/17  at 1751  by [id number]       Isolate --        See Previous Culture For Susceptibility Report  Staphylococcus epidermidis      CULTURE, BLOOD [[id number]]  (Abnormal)  (Susceptibility) Collected:  12/24/17 2230    Order Status:  Completed Specimen:  Blood from [facility] Updated:  12/27/17 0749     Blood Culture Result --        Gram stain Gram Positive Cocci In Clusters  Called to/read back jannet  on 12/25/17  at 1751  by [id number]       Isolate       Staphylococcus epidermidis          CULTURE, BLOOD FOR MRSA/SA BY PCR [[id number]] Collected:  12/24/17 0951    Order Status:  Completed Specimen:  Blood Updated:  12/26/17 1133     Blood culture, MRSA by PCR NEGATIVE        Blood culture, S. aureus by PCR NEGATIVE        Positive culture accn number [id number]        Comment: This PCR testing was performed from the blood culture accession number that is noted. Refer to  this accession number for antibiotic susceptibility testing and final identification.         CULTURE, URINE [[id number]]  (Abnormal) Collected:  12/24/17 2255    Order Status:  Completed Specimen:  Urine from [facility] Updated:  12/26/17 1021     Culture result --        30,000 CFU/mL  Mixed Flora, three or more different organisms present  No  Further Workup             Cardiology:  Results for orders placed or performed during the hospital encounter of 12/24/17   EKG, 12 LEAD, INITIAL   Result Value Ref Range    Ventricular Rate 104 BPM    Atrial Rate 104 BPM    P-R Interval 148 ms    QRS Duration 66 ms    Q-T Interval 328 ms    QTC Calculation (Bezet) 431 ms    Calculated P Axis 18 degrees    Calculated R Axis 17 degrees    Calculated T Axis 16 degrees    Diagnosis       Sinus tachycardia  Otherwise normal ECG  No previous ECGs available  Confirmed by Marrion Coy, M.D., Ellin Saba. (30) on 12/25/2017 7:41:35 AM           Problem List:   Problem List as of 01/01/2018 Date Reviewed: 01/21/2018          Codes Class Noted - Resolved    Bacteremia ICD-10-CM: R78.81  ICD-9-CM: 790.7  12/26/2017 - Present        TIA (transient ischemic attack) ICD-10-CM: G45.9  ICD-9-CM: 435.9  12/25/2017 - Present        Thrombocytopenia ([place]) ICD-10-CM: D69.6  ICD-9-CM: 287.5  05/13/2017 - Present        Neutropenic fever ([place]) ICD-10-CM: D70.9, R50.81  ICD-9-CM: 288.00, 780.61  05/13/2017 - Present        Chest pressure ICD-10-CM: R07.89  ICD-9-CM: 786.59  04/25/2017 - Present        Elevated troponin ICD-10-CM: R74.8  ICD-9-CM: 790.6  04/25/2017 - Present              Medications reviewed  Current Facility-Administered Medications   Medication Dose Route Frequency   ??? aspirin chewable tablet 81 mg  81 mg Oral DAILY   ??? magnesium sulfate 4 g/100 mL IVPB  4 g IntraVENous Q6H   ??? benzonatate (TESSALON) capsule 100 mg  100 mg Oral TID PRN   ??? vancomycin (VANCOCIN) 1,250 mg in 0.9% sodium chloride 500 mL IVPB  1,250 mg IntraVENous Q12H   ??? benzocaine-menthol (CEPACOL) lozenge 1 Lozenge  1 Lozenge Mucous Membrane PRN   ??? albuterol (PROVENTIL VENTOLIN) nebulizer solution 2.5 mg  2.5 mg Nebulization Q4H PRN   ??? 0.9% sodium chloride infusion  83 mL/hr IntraVENous CONTINUOUS   ??? fentaNYL citrate (PF) injection 12.5-50 mcg  12.5-50 mcg IntraVENous PRN   ??? midazolam (VERSED) injection 0.25-2 mg  0.25-2 mg IntraVENous PRN   ??? nystatin (MYCOSTATIN) 100,000 unit/mL oral suspension 500,000 Units  500,000 Units Oral QID   ??? sodium chloride (NS) flush 10-20 mL  10-20 mL InterCATHeter PRN   ??? heparin (porcine) pf 50 Units  50 Units InterCATHeter PRN   ??? DULoxetine (CYMBALTA) capsule 20 mg  20 mg Oral DAILY   ??? magnesium oxide (MAG-OX) tablet 400 mg  400 mg Oral BID   ??? pantoprazole (PROTONIX) tablet 40 mg  40 mg Oral DAILY   ??? acyclovir (ZOVIRAX) tablet 400 mg  400 mg Oral BID   ??? cholecalciferol (VITAMIN D3) tablet 5,000 Units  5,000 Units Oral DAILY    ??? dextrose (D50) infusion 5-25 g  10-50 mL IntraVENous PRN   ??? glucagon (GLUCAGEN) injection 1 mg  1 mg IntraMUSCular PRN   ??? insulin glargine (LANTUS) injection 1-100 Units  1-100 Units SubCUTAneous QHS   ??? insulin lispro (HUMALOG) injection 1-100 Units  1-100  Units SubCUTAneous AC&HS   ??? insulin lispro (HUMALOG) injection 1-100 Units  1-100 Units SubCUTAneous PRN   ??? oxyCODONE IR (ROXICODONE) tablet 10 mg  10 mg Oral Q4H PRN   ??? potassium chloride (K-DUR, KLOR-CON) SR tablet 40 mEq  40 mEq Oral BID   ??? tacrolimus (PROGRAF) capsule 4 mg  4 mg Oral BID   ??? prochlorperazine (COMPAZINE) tablet 10 mg  10 mg Oral Q4H PRN   ??? zolpidem (AMBIEN) tablet 5 mg  5 mg Oral QHS PRN   ??? trimethoprim-sulfamethoxazole (BACTRIM DS, SEPTRA DS) 160-800 mg per tablet 1 Tab  1 Tab Oral Q MON, WED & FRI   ??? naloxone (NARCAN) injection 0.1 mg  0.1 mg IntraVENous PRN   ??? acetaminophen (TYLENOL) tablet 650 mg  650 mg Oral Q4H PRN    Or   ??? acetaminophen (TYLENOL) solution 650 mg  650 mg Oral Q4H PRN    Or   ??? acetaminophen (TYLENOL) suppository 650 mg  650 mg Rectal Q4H PRN   ??? nilotinib (TASIGNA) capsule cap 200 mg  (Patient Supplied)  200 mg Oral BID   ??? LORazepam (ATIVAN) tablet 1 mg  1 mg Oral ON CALL   ??? budesonide (ENTOCORT EC) capsule 6 mg  6 mg Oral TID   ??? *Pharmacy dosing vancomycin  1 Each Other Rx Dosing/Monitoring         Kristen Loader, MD

## 2018-01-01 NOTE — Progress Notes (Signed)
Bedside and Verbal shift change report given to [employer]   (oncoming nurse) by Seward Carol (offgoing nurse). Report included the following information SBAR, Kardex, Intake/Output, MAR, Recent Results and Quality Measures.

## 2018-01-01 NOTE — Progress Notes (Signed)
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[place], last ~1 mo ago per Tan's note --[phone]  -on tassigra comp GVH skin, esophagus   -bactrim tiw, acyclovir prophylaxis  -pancytopenia ch thrombocytopenia, not neutropenic (ANC 8/15 1800)    L numbness, weakness n/v vision change diplopia began Monday 8/12- CTH neg   resolved    GVHD- esophagitis/ diarrhea     improved    thrush - improved   DM on insulin    HTN    PCN allergy       Rec:   ->continue iv vancomycin  ->monitor picc tip from 8/17 - positive for MRSE  ->monitor repeat bctx's from 8/18- now 1 of 2 + for MRSE  -> TEE 8/20 neg     ->  Repeat bl cx x 2  From 8/20  to confirm clearance of bacteremia.        If 8/20 bl cx neg after 2- days may replace picc -- pt says need iv access 2x/week;        If remain neg@48  h, ok to place picc       Will be 48 hrs at 1825 today     OPAT:  ALL sp chemo / Stem cell tx  MRSE CA BSI  Routine picc care per protocol  Vanco dosing per pharmacy- vanco trough goal between 15-20  vanco through sept 3  Monday labs: cbc/diff, bpm fax to  [phone]  Fu with ID 1 week post dc - will arrange      Ok to dc when PICC placed  Discussed with PT, dc planner and care team     -> reviewed repeat MRI - neg      defer w/u for diplopia to Neurology, appears holding on LP for now, pt says better this am   Discussed plan with Dr Nira Retort and pt- at length.  I let ID office know to schedule her for fu appt  MICROBIOLOGY:   8/14 bctx x 2 MRSE   uctx ngtd  8/16 bctx x 2 S epi see prior   8/17     Cath tip > S epi  8/18 bctx x 2 ip- 1 of 2  G+ cocci    LINES AND CATHETERS:   PICC- dc'ed  8/17  piv intact    Active Hospital Problems    Diagnosis Date Noted   ??? Bacteremia 12/26/2017   ??? TIA (transient ischemic attack) 12/25/2017         Subjective:     Interval notes reviewed. Pt says feeling better, stinging with vanco infusion.  PT many questions today-answered and discussed plan as outlined above.  Mouth bettter    Current Facility-Administered Medications   Medication Dose Route Frequency   ??? aspirin chewable tablet 81 mg  81 mg Oral DAILY   ??? vancomycin (VANCOCIN) 1,250 mg in 0.9% sodium chloride 500 mL IVPB  1,250 mg  IntraVENous Q12H   ??? benzocaine-menthol (CEPACOL) lozenge 1 Lozenge  1 Lozenge Mucous Membrane PRN   ??? albuterol (PROVENTIL VENTOLIN) nebulizer solution 2.5 mg  2.5 mg Nebulization Q4H PRN   ??? 0.9% sodium chloride infusion  83 mL/hr IntraVENous CONTINUOUS   ??? fentaNYL citrate (PF) injection 12.5-50 mcg  12.5-50 mcg IntraVENous PRN   ??? midazolam (VERSED) injection 0.25-2 mg  0.25-2 mg IntraVENous PRN   ??? nystatin (MYCOSTATIN) 100,000 unit/mL oral suspension 500,000 Units  500,000 Units Oral QID   ??? sodium chloride (NS) flush 10-20 mL  10-20 mL InterCATHeter PRN   ??? heparin (porcine) pf 50 Units  50 Units InterCATHeter PRN   ??? DULoxetine (CYMBALTA) capsule 20 mg  20 mg Oral DAILY   ??? magnesium oxide (MAG-OX) tablet 400 mg  400 mg Oral BID   ??? pantoprazole (PROTONIX) tablet 40 mg  40 mg Oral DAILY   ??? acyclovir (ZOVIRAX) tablet 400 mg  400 mg Oral BID   ??? cholecalciferol (VITAMIN D3) tablet 5,000 Units  5,000 Units Oral DAILY   ??? dextrose (D50) infusion 5-25 g  10-50 mL IntraVENous PRN   ??? glucagon (GLUCAGEN) injection 1 mg  1 mg IntraMUSCular PRN   ??? insulin glargine (LANTUS) injection 1-100 Units  1-100 Units SubCUTAneous QHS   ??? insulin lispro (HUMALOG) injection 1-100 Units  1-100 Units SubCUTAneous AC&HS   ??? insulin lispro (HUMALOG) injection 1-100 Units  1-100 Units SubCUTAneous  PRN   ??? oxyCODONE IR (ROXICODONE) tablet 10 mg  10 mg Oral Q4H PRN   ??? potassium chloride (K-DUR, KLOR-CON) SR tablet 40 mEq  40 mEq Oral BID   ??? tacrolimus (PROGRAF) capsule 4 mg  4 mg Oral BID   ??? prochlorperazine (COMPAZINE) tablet 10 mg  10 mg Oral Q4H PRN   ??? zolpidem (AMBIEN) tablet 5 mg  5 mg Oral QHS PRN   ??? trimethoprim-sulfamethoxazole (BACTRIM DS, SEPTRA DS) 160-800 mg per tablet 1 Tab  1 Tab Oral Q MON, WED & FRI   ??? naloxone (NARCAN) injection 0.1 mg  0.1 mg IntraVENous PRN   ??? acetaminophen (TYLENOL) tablet 650 mg  650 mg Oral Q4H PRN    Or   ??? acetaminophen (TYLENOL) solution 650 mg  650 mg Oral Q4H PRN    Or   ??? acetaminophen (TYLENOL) suppository 650 mg  650 mg Rectal Q4H PRN   ??? nilotinib (TASIGNA) capsule cap 200 mg  (Patient Supplied)  200 mg Oral BID   ??? LORazepam (ATIVAN) tablet 1 mg  1 mg Oral ON CALL   ??? budesonide (ENTOCORT EC) capsule 6 mg  6 mg Oral TID   ??? *Pharmacy dosing vancomycin  1 Each Other Rx Dosing/Monitoring         Objective:     Visit Vitals  BP 127/73 (BP 1 Location: Left arm, BP Patient Position: Sitting)   Pulse 84   Temp 98.2 ??F (36.8 ??C)   Resp 16   Ht 5' (1.524 m)   Wt 67.5 kg (148 lb 13 oz)   SpO2 100%   BMI 22.63 kg/m??       Temp (24hrs), Avg:98.8 ??F (37.1 ??C), Min:98.2 ??F (36.8 ??C), Max:99.7 ??F (37.6 ??C)    GEN: WFWN BF appears stated age lying in bed NAD   HEENT: anicteric sclerae moist orophyarynx  NECK: Supple   CHEST: no rales rhonchi or wheeze  CVS: S1'S2' no rub murmur or gallop  ABD: soft non-tender (+) BS no masses non-distended  EXT: no edema LUE picc out     Labs: Results:   Chemistry Recent Labs     01/01/18  0327   GLU 198*   NA 144   K 4.9   CL 110*   CO2 27   BUN 14   CREA 1.0   CA 8.6   AGAP 7   ALB 3.0*      CBC w/Diff Recent Labs     01/01/18  0327   WBC 4.4   RBC 2.79*   HGB 9.6*   HCT 28.5*   PLT 115*   GRANS 64.4*   LYMPH 15.4*        Mri brain 8/16 IMPRESSION:  1.   Nonspecific white matter hyperintensities likely due to chronic   microvascular ischemic changes. Iatrogenic drug toxicity, graft-versus-host,  inflammatory/infectious, hypertension, demyelinating process, trauma, chronic  headaches, sequelae of vasculitis are other considerations.  2. Low-lying cerebellar tonsils.  3. No orbital masses. No infarction    8/17 mri IMPRESSION:  ??  1.  No abnormal enhancement to suggest active infectious/inflammatory/neoplastic  process    Echo 8/16 Interpretation Summary  A complete two-dimensional transthoracic echocardiogram was performed (2D, M-  mode, Doppler and color flow Doppler).  The study was technically adequate.    Small left ventricle, norla wall thickness with normal systolic function and   a  calculated ejectio fraction of 77%.  No regional wall motion abnormalites are noted.  Grade 1 diastolic dysfunction.  Normal size right ventricle, normal systolic function.  No hemodynamic valvular pathology or signficiant regurgitation.  Estimated right ventricular systolic pressure: 29HBZJ.  Negative bubble study for interatrial shunting.      35 min spent with > 50% spent on counseling and coordination of care    Collene Mares, MD  January 01, 2018  [facility]  [phone]

## 2018-01-01 NOTE — Progress Notes (Signed)
Spoke to Amy liaison for [facility] @ [phone], notified her of tentative dc 8/23, requested orders be faxed to [phone].

## 2018-01-01 NOTE — Progress Notes (Signed)
Per Izora Gala liaison at [facility] [place] agency is the provider, notified her of tentative Friday dc.

## 2018-01-01 NOTE — Progress Notes (Signed)
Problem: Falls - Risk of  Goal: *Absence of Falls  Description  Document Patrcia Dolly Fall Risk and appropriate interventions in the flowsheet.  Outcome: Progressing Towards Goal  Note:   Fall Risk Interventions:  Mobility Interventions: Assess mobility with egress test, Communicate number of staff needed for ambulation/transfer, Patient to call before getting OOB, Bed/chair exit alarm         Medication Interventions: Assess postural VS orthostatic hypotension, Bed/chair exit alarm, Evaluate medications/consider consulting pharmacy, Patient to call before getting OOB, Teach patient to arise slowly    Elimination Interventions: Bed/chair exit alarm, Call light in reach, Patient to call for help with toileting needs              Problem: Diabetes Self-Management  Goal: *Disease process and treatment process  Description  Define diabetes and identify own type of diabetes; list 3 options for treating diabetes.  Outcome: Progressing Towards Goal     Problem: Gas Exchange - Impaired  Goal: *Absence of hypoxia  Outcome: Progressing Towards Goal

## 2018-01-01 NOTE — Other (Deleted)
PHYSICIAN???S DOCUMENTATION REQUEST   This Form is Not a Permanent Document in the Medical Record         By submitting this query, we are merely seeking further clarification of documentation to accurately reflect all conditions that you are monitoring, evaluating, treating or that extend the hospitalization or utilize additional resources of care. Please utilize your independent clinical judgment when addressing the question(s) below.    Dear Dr. Nira Retort,    Documentation by Dr. Nira Retort  Pt has multiple positive BCx with Staph epi.  PICC tip was positive for S epi  Possiblility of septic emboli  ??  Staph epidermidis Sepsis    Documentation by Dr Glendora Score  .MRSE BSI- CA      picc infection - + catheter tip      TEE neg for IE-- appreciate cardiology assist   bl cx 8/20 remain ngtd  -TTE no reported vegetation, bubble study negative   ------------------------------------------------------------------------------------------------  Please clarify, if known, any relationship between the diagnosis of Sepsis and PICC line Infection.    Are the conditions:  ?? Due to or associated with each other  ?? Unrelated to each other  ?? Clinically Undetermined    Was the diagnosis Present on Admission?  ?? Yes  ?? No  ?? Clinically Undetermined          PLEASE DOCUMENT ANY ADDITIONAL DIAGNOSES AND/OR SPECIFICITY IN THE PROGRESS NOTES AND/OR DISCHARGE SUMMARY.  Thank you,  Lexine Baton MD, CCS, [place], [place] Documentation Specialist  [phone]

## 2018-01-01 NOTE — Progress Notes (Signed)
Discussed dcp with patient [place] for [facility] received.

## 2018-01-01 NOTE — Other (Signed)
Bedside and Verbal shift change report given to [place]   (oncoming nurse) by Sherrine Maples (offgoing nurse). Report included the following information SBAR, Kardex and MAR.

## 2018-01-02 LAB — GLUCOSE, POC
Glucose (POC): 327 mg/dL — ABNORMAL HIGH (ref 65–105)
Glucose (POC): 210 mg/dL — ABNORMAL HIGH (ref 65–105)
Glucose (POC): 200 mg/dL — ABNORMAL HIGH (ref 65–105)

## 2018-01-02 LAB — CULTURE, BLOOD: Blood Culture Result: NO GROWTH

## 2018-01-02 LAB — CULTURE, BLOOD 1: BLOOD CULTURE RESULT: NO GROWTH

## 2018-01-02 LAB — POCT GLUCOSE
POC Glucose: 327 mg/dL — ABNORMAL HIGH (ref 65–105)
POC Glucose: 210 mg/dL — ABNORMAL HIGH (ref 65–105)
POC Glucose: 200 mg/dL — ABNORMAL HIGH (ref 65–105)

## 2018-01-02 MED ORDER — BENZONATATE 100 MG CAP
100 mg | ORAL_CAPSULE | Freq: Three times a day (TID) | ORAL | 0 refills | Status: DC | PRN
Start: 2018-01-02 — End: 2019-03-22

## 2018-01-02 MED ORDER — BENZOCAINE-MENTHOL 15 MG-3.6 MG LOZENGES
LOZENGE | 0 refills | Status: DC | PRN
Start: 2018-01-02 — End: 2019-03-22

## 2018-01-02 MED ORDER — ALBUTEROL SULFATE 0.083 % (0.83 MG/ML) SOLN FOR INHALATION
2.5 mg /3 mL (0.083 %) | INHALATION_SOLUTION | RESPIRATORY_TRACT | 1 refills | Status: DC | PRN
Start: 2018-01-02 — End: 2021-03-21

## 2018-01-02 MED ORDER — MAGNESIUM SULFATE 2 GRAM/50 ML IVPB
2 gram/50 mL (4 %) | Freq: Once | INTRAVENOUS | Status: AC
Start: 2018-01-02 — End: 2018-01-02
  Administered 2018-01-02: 18:00:00 via INTRAVENOUS

## 2018-01-02 MED ORDER — NYSTATIN 100,000 UNIT/ML ORAL SUSP
100000 unit/mL | Freq: Four times a day (QID) | ORAL | 0 refills | Status: DC
Start: 2018-01-02 — End: 2019-03-22

## 2018-01-02 MED ORDER — ASPIRIN 81 MG CHEWABLE TAB
81 mg | ORAL_TABLET | Freq: Every day | ORAL | 1 refills | Status: DC
Start: 2018-01-02 — End: 2019-03-22

## 2018-01-02 MED ORDER — VANCOMYCIN 10 GRAM IV SOLR
10 gram | Freq: Two times a day (BID) | INTRAVENOUS | 0 refills | Status: AC
Start: 2018-01-02 — End: 2018-01-13

## 2018-01-02 MED FILL — BUDESONIDE SR 3 MG 24 HR CAP: 3 mg | ORAL | Qty: 2

## 2018-01-02 MED FILL — MAGNESIUM OXIDE 400 MG TAB: 400 mg | ORAL | Qty: 1

## 2018-01-02 MED FILL — MAGNESIUM SULFATE 2 GRAM/50 ML IVPB: 2 gram/50 mL (4 %) | INTRAVENOUS | Qty: 50

## 2018-01-02 MED FILL — TACROLIMUS 1 MG CAP: 1 mg | ORAL | Qty: 4

## 2018-01-02 MED FILL — CHOLECALCIFEROL (VITAMIN D3) 1,000 UNIT (25 MCG) TAB: ORAL | Qty: 5

## 2018-01-02 MED FILL — MAGNESIUM SULFATE 4 GRAM/100 ML IV PIGGY BACK: 4 gram/100 mL ( %) | INTRAVENOUS | Qty: 100

## 2018-01-02 MED FILL — BENZONATATE 100 MG CAP: 100 mg | ORAL | Qty: 1

## 2018-01-02 MED FILL — VANCOMYCIN 10 GRAM IV SOLR: 10 gram | INTRAVENOUS | Qty: 1250

## 2018-01-02 MED FILL — ACYCLOVIR 800 MG TAB: 800 mg | ORAL | Qty: 1

## 2018-01-02 MED FILL — ZOLPIDEM 5 MG TAB: 5 mg | ORAL | Qty: 1

## 2018-01-02 MED FILL — PANTOPRAZOLE 40 MG TAB, DELAYED RELEASE: 40 mg | ORAL | Qty: 1

## 2018-01-02 MED FILL — ALBUTEROL SULFATE 0.083 % (0.83 MG/ML) SOLN FOR INHALATION: 2.5 mg /3 mL (0.083 %) | RESPIRATORY_TRACT | Qty: 1

## 2018-01-02 MED FILL — ASPIRIN 81 MG CHEWABLE TAB: 81 mg | ORAL | Qty: 1

## 2018-01-02 MED FILL — POTASSIUM CHLORIDE SR 20 MEQ TAB, PARTICLES/CRYSTALS: 20 mEq | ORAL | Qty: 2

## 2018-01-02 MED FILL — NYSTATIN 100,000 UNIT/ML ORAL SUSP: 100000 unit/mL | ORAL | Qty: 5

## 2018-01-02 MED FILL — DULOXETINE 20 MG CAP, DELAYED RELEASE: 20 mg | ORAL | Qty: 1

## 2018-01-02 MED FILL — TRIMETHOPRIM-SULFAMETHOXAZOLE 160 MG-800 MG TAB: 160-800 mg | ORAL | Qty: 1

## 2018-01-02 NOTE — Progress Notes (Signed)
Jozelyn [facility] intake specialist @ [phone] requested updated IB abx orders with stop date, received orders from Dr. Glendora Score and faxed to [facility] via [facility].

## 2018-01-02 NOTE — Progress Notes (Signed)
Discharge Plan:   home    Discharge Date:     01/02/2018     Home Health Needed:     yes    Home Health Agency:    [facility]    Willard given : [facility] and [facility]    Confirmed start of care with the home health agency and spoke with:  Message left on Amy's voicemail     DME Company:    [facility]    CM confirmed delivery of DME: with      [employer]     Transportation: Family will transport.    Received call from [facility] who said per nursing they can not come out until 0800 tomorrow.

## 2018-01-02 NOTE — Procedures (Signed)
Procedures by Fraser Din, RN at 01/02/18 1302                Author: Fraser Din, RN  Service: Vascular Access  Author Type: Registered Nurse       Filed: 01/02/18 1303  Date of Service: 01/02/18 1302  Status: Signed          Editor: Springer, Otila Kluver, RN (Registered Nurse)            Procedures        1. PICC INSERTION VASCULAR ACCESS TEAM [[id number]]

## 2018-01-02 NOTE — Progress Notes (Signed)
Problem: Falls - Risk of  Goal: *Absence of Falls  Description  Document [name] Wu Fall Risk and appropriate interventions in the flowsheet.  Outcome: Progressing Towards Goal  Note:   Fall Risk Interventions:  Mobility Interventions: Assess mobility with egress test, Communicate number of staff needed for ambulation/transfer, Patient to call before getting OOB         Medication Interventions: Patient to call before getting OOB, Teach patient to arise slowly    Elimination Interventions: Call light in reach, Patient to call for help with toileting needs              Problem: Diabetes Self-Management  Goal: *Disease process and treatment process  Description  Define diabetes and identify own type of diabetes; list 3 options for treating diabetes.  Outcome: Progressing Towards Goal  Goal: *Incorporating nutritional management into lifestyle  Description  Describe effect of type, amount and timing of food on blood glucose; list 3 methods for planning meals.  Outcome: Progressing Towards Goal  Goal: *Incorporating physical activity into lifestyle  Description  State effect of exercise on blood glucose levels.  Outcome: Progressing Towards Goal  Goal: *Developing strategies to promote health/change behavior  Description  Define the ABC's of diabetes; identify appropriate screenings, schedule and personal plan for screenings.  Outcome: Progressing Towards Goal  Goal: *Using medications safely  Description  State effect of diabetes medications on diabetes; name diabetes medication taking, action and side effects.  Outcome: Progressing Towards Goal  Goal: *Monitoring blood glucose, interpreting and using results  Description  Identify recommended blood glucose targets  and personal targets.  Outcome: Progressing Towards Goal  Goal: *Prevention, detection, treatment of acute complications  Description  List symptoms of hyper- and hypoglycemia; describe how to treat low blood sugar and actions for lowering  high blood glucose  level.  Outcome: Progressing Towards Goal  Goal: *Prevention, detection and treatment of chronic complications  Description  Define the natural course of diabetes and describe the relationship of blood glucose levels to long term complications of diabetes.  Outcome: Progressing Towards Goal  Goal: *Developing strategies to address psychosocial issues  Description  Describe feelings about living with diabetes; identify support needed and support network  Outcome: Progressing Towards Goal  Goal: *Sick day guidelines  Outcome: Progressing Towards Goal     Problem: Gas Exchange - Impaired  Goal: *Absence of hypoxia  Outcome: Progressing Towards Goal     Problem: Gas Exchange - Impaired  Goal: *Absence of hypoxia  Outcome: Progressing Towards Goal

## 2018-01-02 NOTE — Discharge Summary (Signed)
Discharge Summary by Kristen Loader, MD at 01/02/18 1636                Author: Kristen Loader, MD  Service: Hospitalist  Author Type: Physician       Filed: 01/08/18 0639  Date of Service: 01/02/18 1636  Status: Signed          Editor: Kristen Loader, MD (Physician)                 Physician Discharge Summary        Patient ID:   Patient Name: [name] Wu   Medical Record Number: [id number]   Date of Birth: March 27, 1961   Age: 57 y.o.      Primary Care Provider: Other, Phys, MD      Code Status: Full Code       Admit date: 12/24/2017      Discharge Date:  01/02/2018      Admitting Physician: Einar Grad, MD      Discharge Physician: Kristen Loader, MD      Discharge Disposition: Home      Activity: Activity as tolerated      Diet:  Diabetic Diet      Follow-up appointments:         Follow-up Information               Follow up With  Specialties  Details  Why  [place]  [facility]ME [facility]      [place]   [phone]              [facility]        [phone]              Other, Phys, MD        Patient can only remember the practice name and not the physician                -Oncology/PCP: Dr Aldona Bar 1 week      -[place] in [place]: Monday as scheduled      Follow-up recommendations:      ? Lab: Renal panel, CBC, mag level, phos level      Patient Instructions:      Current Discharge Medication List              START taking these medications          Details        nystatin (MYCOSTATIN) 100,000 unit/mL suspension  Take 5 mL by mouth four (4) times daily. swish and spit   Qty: 1 mL, Refills:  0               benzonatate (TESSALON) 100 mg capsule  Take 1 Cap by mouth three (3) times daily as needed for Cough.   Qty: 30 Cap, Refills:  0               benzocaine-menthol (CEPACOL) 15-3.6 mg lozg lozenge  1 Lozenge by Mucous Membrane route as needed (sore throat).   Qty: 60 Lozenge, Refills:  0               aspirin 81 mg chewable  tablet  Take 1 Tab by mouth daily.   Qty: 30 Tab, Refills:  1  albuterol (PROVENTIL VENTOLIN) 2.5 mg /3 mL (0.083 %) nebu  3 mL by Nebulization route every four (4) hours as needed (wheezing).   Qty: 120 Nebule, Refills:  1               vancomycin 10 gram 1,250 mg IVPB  1,250 mg by IntraVENous route every twelve (12) hours every twelve (12) hours for 10 days. Vanco dosing per pharmacy- vanco trough goal between 15-20   Qty: 20 Dose, Refills:  0                     CONTINUE these medications which have NOT CHANGED          Details        DULoxetine (CYMBALTA) 20 mg capsule  Take 20 mg by mouth daily. Indications: Patient stated to eliminated bone pain               magnesium oxide (MAG-OX) 400 mg tablet  Take 400 mg by mouth two (2) times a day.               pantoprazole (PROTONIX) 40 mg tablet  Take 40 mg by mouth daily.               prochlorperazine (COMPAZINE) 10 mg tablet  Take 10 mg by mouth every four (4) hours as needed.               budesonide (ENTOCORT EC) 3 mg capsule  Take 6 mg by mouth three (3) times daily.               nilotinib (TASIGNA) 200 mg cap capsule  Take 200 mg by mouth two (2) times a day.               Magnesium Oxide-Mg AA Chelate (MG-PLUS) 133 mg tab  Take 1 Tab by mouth three (3) times daily.               magnesium sulfate in water (MAGNESIUM SULFATE 2 G/50 ML) 2 gram/50 mL (4 %) IVPB  2-4 g by IntraVENous route as needed. 2 gm IV over 2 hours for magnesium level 1.5-1.7   4gm IV over 4 hours for magnesium level < 1.5   HOLD if magnesium >= 1.8   Infusion based on results drawn on Mondays and Thursdays. May be given daily up to 2x/week   Indications: low amount of magnesium in the blood               cholecalciferol, VITAMIN D3, (VITAMIN D3) 5,000 unit tab tablet  Take 5,000 Units by mouth daily.               0.9 % sodium chloride (SODIUM CHLORIDE 0.9 % IV)  1,000 mL by IntraVENous route See Admin Instructions. 1 L IV up to 2 times a week.   IVF to be given at 250 ml/hr  over 4 hours if creatinine > or = 1.3 when drawn on Mondays and Thursdays               tacrolimus (PROGRAF) 0.5 mg capsule  Take 4 mg by mouth two (2) times a day.               trimethoprim-sulfamethoxazole (BACTRIM DS) 160-800 mg per tablet  Take 1 Tab by mouth See Admin Instructions. 1 tab orally Monday, Wednesday, Friday               oxyCODONE  IR (ROXICODONE) 5 mg immediate release tablet  Take 2 Tabs by mouth every four (4) hours as needed. Max Daily Amount: 60 mg.   Qty: 60 Tab, Refills:  0          Associated Diagnoses: Chest pressure               acyclovir (ZOVIRAX) 400 mg tablet  Take 400 mg by mouth two (2) times a day.               insulin lispro (HUMALOG JUNIOR KWIKPEN U-100) 100 unit/mL inph  by SubCUTAneous route three (3) times daily.               potassium chloride SR (K-TAB) 20 mEq tablet  Take 40 mEq by mouth two (2) times a day.               insulin degludec (TRESIBA FLEXTOUCH U-200) 200 unit/mL (3 mL) inpn  30 Units by SubCUTAneous route daily.               zolpidem (AMBIEN) 5 mg tablet  Take 5 mg by mouth nightly.                         Admission Diagnoses: TIA (transient ischemic attack) [G45.9]   Bacteremia [R78.81]      Discharge Diagnoses: see below      Admission Condition: fair      Discharged Condition: good      Hospital Course:       Probable TIA   Pt has multiple positive BCx with Staph epi.   PICC tip was positive for S epi.   Pt with TIA and faint murmur on exam.   TEE 8/20 was negative for vegetations.    BE ruled out   Neuro rec ASA if not contraindicated (pt with some thrombocytopenia)   ??   Staph epidermidis Sepsis with CLABSI   Immunocompromised pt   TEE 8/20 was negative for vegetations   ID is following   Pt is on Bactrim TIW and Vancomycin   BCx 8/20 NGTD   d/w ID, plan on d/c home with OPAT (Vanco)   ??   Diplopia with transient slurred speech, LUE weakness, gait disturbances.    Probable TIA   Work-up as above   Sx have resolved.   LDL 88 (8/16), HbA1C 7 (8/16)    PT/OT   ??   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   I spoke with her lead BMT oncologist and he noted to continue treatment here for infxn and once stable for discharge to d/c home so she can go to [place] on her own.   Pt is on Tasigna 254m BID   ??   Esophagitis and diarrhea d/t GVH dz    Seems to have improved.    Pt is on Compazine PRN   ??   Oral thrush.   Added Nystatin (8/19)   Possible GVH, but will treat   ??   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   DM2 uncontrolled with hyperglycemia   Glucomander while inpt   Resume home meds   ??  Hypomagnesemia.   Repleted prior to d/c   Pt noted she gets routine IV Mag   Continue PO magOx.   ??   Chronic pain   APAP and Oxycodone PRN   ??   DVT Prophylaxis.   SCDs   No heparin due to plt <100   ??   Code status. Full code.   ??   Disposition.   S/p TEE 8/20 which was neg for vegetations   PICC line planned   D/c home 8/23 and have pt f/u with her Oncologists in [place], [place] soon after      ------------------      65 minutes were spent on the discharge of the patient of which more than 50% was spent in coordination of care and counseling (time spent with patient/family face to face, physical exam, reviewing laboratory and imaging investigations, speaking with physicians  and nursing staff involved in this patient's care)      ------------------   Physical exam:   General:  Alert, NAD, pleasant.  Cooperative   HEENT: Sclera anicteric, PERRL, OM moist, throat clear.   Chest:  CTA B, no wheeze, no rales, no rhonchi.  Good air movement.   CV: RRR, S1/S2 were normal, no murmur   Abdomen: NTND, soft, NABS, no masses were noted.  No rebound, no guarding.   Extremities: No edema.        Subjective:        Pt  said she feels well and will f/u with her Oncologist in [place] soon (likely early next week             Visit Vitals      BP  124/67 (BP 1 Location: Left arm, BP Patient Position: Sitting)     Pulse  82     Temp  97.8 ??F (36.6 ??C)     Resp  17     Ht  5' (1.524 m)     Wt  67.5 kg (148 lb 13 oz)     SpO2  100%        BMI  22.63 kg/m??              Initial presentation and work-up:   [As per Dr  Christen Bame Patel's H+P from 12/25/2017]   #############   "     Chief Complaint:      ??   Diplopia   ??     History of Present Illness:      ??   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  She stated this started on Monday with initial  blurry vision and left arm weakness with mild slurred speech.  At the same time, she noted blood sugar around 70.  She contacted her oncologist at the [facility] in [place] who thought this was hypoglycemic related and advised  her to increase her glucose intake.  Her slurred speech and left arm weakness improved however had continued blurred vision that transitioned to double vision. In addition, she has gait disturbances associated with her diplopia.  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  She follows up with an oncologist at [facility] in [place] and has seen Dr. Edd Arbour here at [facility].  No chest pain, shortness  of breath,  abdominal pain, vomiting, leg pain, or swelling.  No rashes.   ??     Past Medical History:     ??                  Past Medical History:        Diagnosis  Date         ?  Diabetes ([place])  ??     ?  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     ?  H/O stem cell transplant ([place])  041/04/19     ?  Hx antineoplastic chemotherapy  ??     ?  Hypertension  ??     ?  Leukemia ([place])  ??     ??   ??     Past Social History:     ??   Patient denies any  alcohol, tobacco, or illicit drug use. Patient is divorced and has no children.  She currently does not work.  She is a fan of the [employer] and does not like the [place]   ??     Family History:     ??   Reviewed and noncontributory   ??     Allergies:     ??                  Allergies        Allergen  Reactions         ?  Pcn [Penicillins]  Hives and Itching     ??   ??     Home Medications:     ??                           Prior to Admission medications             Medication  Sig  Start Date  End Date  Taking?  Authorizing Provider            prochlorperazine (COMPAZINE) 10 mg tablet  Take 10 mg by mouth every four (4) hours as needed.  ??  ??  Yes  Other, Phys, MD     budesonide (ENTOCORT EC) 3 mg capsule  Take 6 mg by mouth three (3) times daily.  ??  ??  Yes  Other, Phys, MD     nilotinib (TASIGNA) 200 mg cap capsule  Take 200 mg by mouth two (2) times a day.  ??  ??  Yes  Other, Phys, MD     Magnesium Oxide-Mg AA Chelate (MG-PLUS) 133 mg tab  Take 1 Tab by mouth three (3) times daily.  ??  ??  Yes  Other, Phys, MD            magnesium sulfate in water (MAGNESIUM SULFATE 2 G/50 ML) 2 gram/50 mL (4 %) IVPB  2-4 g by IntraVENous route as needed. 2 gm IV over 2 hours for magnesium level 1.5-1.7   4gm IV over 4 hours for magnesium level < 1.5   HOLD if magnesium >= 1.8   Infusion based on results drawn on Mondays and Thursdays. May be given daily up to 2x/week   Indications: low amount of magnesium in the blood  ??  ??  Yes  Other, Phys, MD            cholecalciferol, VITAMIN D3, (VITAMIN D3) 5,000 unit tab tablet  Take 5,000 Units by mouth daily.  ??  ??  Yes  Other, Phys, MD     0.9 % sodium chloride (SODIUM CHLORIDE 0.9 % IV)  1,000 mL by IntraVENous route See Admin Instructions. 1 L IV up to 2 times a week.   IVF to be given at 250 ml/hr over 4 hours if creatinine > or = 1.3 when drawn on Mondays and Thursdays  ??  ??  Yes  Other, Phys, MD     tacrolimus (PROGRAF) 0.5 mg capsule  Take 4 mg by mouth two (2)  times a day.  ??  ??  Yes  Other, Phys, MD     trimethoprim-sulfamethoxazole (BACTRIM DS) 160-800 mg per tablet  Take 1 Tab by mouth See Admin Instructions. 1 tab orally Monday, Wednesday, Friday  ??  ??  Yes  Other, Phys, MD     oxyCODONE IR (ROXICODONE) 5 mg immediate release tablet  Take 2 Tabs by mouth every four (4) hours as needed. Max Daily Amount: 60 mg.  04/26/17  ??  Yes  Tonny Bollman, MD     acyclovir (ZOVIRAX) 400 mg tablet  Take 400 mg by mouth two (2) times a day.  ??  ??  Yes  Other, Phys, MD            insulin lispro (HUMALOG JUNIOR KWIKPEN U-100) 100 unit/mL inph  by SubCUTAneous route three (3) times daily.  ??  ??  Yes  Other, Phys, MD            potassium chloride SR (K-TAB) 20 mEq tablet  Take 40 mEq by mouth two (2) times a day.  ??  ??  Yes  Other, Phys, MD     insulin degludec (TRESIBA FLEXTOUCH U-200) 200 unit/mL (3 mL) inpn  30 Units by SubCUTAneous route daily.  ??  ??  Yes  Other, Phys, MD     zolpidem (AMBIEN) 5 mg tablet  Take 5 mg by mouth nightly.  ??  ??  Yes  Other, Phys, MD     ??   ??     Review of Systems:     ??   POSITIVES ARE HIGHLIGHTED IN RED   ??   General: fevers, chills, sweats, fatigue, malaise, wt loss, wt gain, weakness   Skin: skin changes   HEENT: bleeding gums, cataracts, decreased hearing, dizziness, dry mouth, ear discharge, ear pain, epistaxis, eye discharge, eye itching,  eye pain, eye redness, headache, head injury, hoarseness, nasal congestion, oral ulcers, postnasal drip, rhinitis, ringing in the ears, sinus pain, throat itchiness, throat pain, throat soreness, toothache, vertigo,  vision changes   Neck: decreased ROM, neck mass, neck pain, neck stiffness, swollen glands   Respiratory: cough, dyspnea, sputum, wheezing, pleurisy   CV: chest pain, edema, orthopnea, palpitations, PND   GI: abd pain, anorexia, constipation, diarrhea, dysphagia, hemorrhoids, hematochezia, melena,  nausea, vomiting, reflux   GU: dysuria, frequency, hesitancy, incontinence, nocturia, polyuria,  urgency   MS:  pain, stiffness, decreased ROM, leg cramps   Neuro: LOC, paralysis, paresthesias, seizures, syncope, stroke, tremor   ??     Physical Examination:     ??   Visit Vitals      BP  119/77     Pulse  89     Temp  98.8 ??F (37.1 ??C)     Resp  18     Ht  5' (1.524 m)     Wt  65.3 kg (144 lb)     SpO2  99%     BMI  28.12 kg/m??     ??      General  No acute distress.  Alert and oriented x3     Head/face  Normocephalic.  Atraumatic     Neck  Supple.  No evidence of jugular venous distention     Eyes  No scleral icterus noted.  No conjunctival pallor observed     Cardiac  Regular rate and rhythm.  S1 and S2 heard.  No murmurs noted     Chest  Symmetrical chest rise.  No tenderness to palpation     Respiratory  Clear to auscultation bilaterally.  No wheezing or crackles noted     Abdomen  Soft.  Nontender and nondistended.  Bowel sounds present     Extremities  No gross lower or upper extremity edema noted.  No obvious erythema noted     Skin  No rashes or lesions noted on face, arms, or legs     Back  No focal back tenderness on palpation.  No gross bony deformities noted     Neurological  No focal neurological deficits noted. No cranial nerves grossly intact         "   #############      ACUTE DIAGNOSES:   TIA (transient ischemic attack) [G45.9]   Bacteremia [R78.81]      CHRONIC MEDICAL DIAGNOSES:      Problem List as of 01/02/2018  Date Reviewed:  Dec 30, 2017                        Codes  Class  Noted - Resolved             Bacteremia  ICD-10-CM: R78.81   ICD-9-CM: 790.7    12/26/2017 - Present                       TIA (transient ischemic attack)  ICD-10-CM: G45.9   ICD-9-CM: 435.9    12/25/2017 - Present                       Thrombocytopenia ([place])  ICD-10-CM: D69.6   ICD-9-CM: 287.5    05/13/2017 - Present                       Neutropenic fever ([place])  ICD-10-CM: D70.9, R50.81   ICD-9-CM: 288.00, 780.61    05/13/2017 - Present                       Chest pressure  ICD-10-CM: R07.89   ICD-9-CM: 786.59    04/25/2017 -  Present                       Elevated troponin  ICD-10-CM: R74.8   ICD-9-CM: 790.6    04/25/2017 - Present                               Data Review:          Recent Days:     Recent Labs           01/01/18   0327     WBC  4.4     HGB  9.6*     HCT  28.5*        PLT  115*          Recent Labs  01/01/18   0327     NA  144     K  4.9     CL  110*     CO2  27     GLU  198*     BUN  14     CREA  1.0     CA  8.6     MG  1.3*     PHOS  3.2        ALB  3.0*        No results for input(s): PH, PCO2, PO2, HCO3, FIO2 in the last 72 hours.      24 Hour Results:     Recent Results (from the past 24 hour(s))     GLUCOSE, POC          Collection Time: 01/01/18 10:18 PM         Result  Value  Ref Range            Glucose (POC)  327 (H)  65 - 105 mg/dL       GLUCOSE, POC          Collection Time: 01/02/18 10:01 AM         Result  Value  Ref Range            Glucose (POC)  210 (H)  65 - 105 mg/dL       GLUCOSE, POC          Collection Time: 01/02/18  2:15 PM         Result  Value  Ref Range            Glucose (POC)  200 (H)  65 - 105 mg/dL           Xr Chest Pa Lat      Result Date: 12/31/2017   Chest Indication: Dyspnea on exertion.    Comparison: 12/24/2017 Findings: Frontal and lateral views of the chest demonstrate that the cardiac silhouette is not enlarged.  There is no focal infiltrate, pleural effusion, vascular congestion, or pneumothorax.            Impression: No acute cardiopulmonary disease.       Xr Chest Pa Lat      Result Date: 12/24/2017   Chest Indication: hx of GVHD with N/V/D.    Comparison: 05/20/2017 Findings: Frontal and lateral views of the chest demonstrate that the cardiac silhouette is not enlarged.  There is no focal infiltrate, pleural effusion, vascular congestion, or pneumothorax.   Left PICC with tip overlying the distal superior vena cava.       Impression: No acute cardiopulmonary disease.       Mri Brain Wo Cont      Result Date: 12/26/2017   EXAMINATION: MRI BRAIN WO CONT CLINICAL INDICATION:  Diplopia; TIA bone marrow transplant. Blurred vision COMPARISON:  None. TECHNIQUE: T1 and T2-weighted sequences of the brain were obtained in multiple planes without contrast. FINDINGS: No restricted  diffusion. No masses, no midline shift. No hydrocephalus. No extra-axial fluid collections. Visualized portions of the orbits grossly unremarkable. Mastoid air cells well aerated. Visualized portions of paranasal sinuses unremarkable. Intracranial flow  voids of great vessels, grossly intact. Diffuse low signal marrow. Age-appropriate atrophy. No susceptibility artifact on gradient echo imaging to suggest hemorrhage. Cerebellar tonsil extends into the foramen magnum 2 mm or so. Pituitary gland normal.  Orbits appear normal. No compression of optic chiasm extraocular muscles are normal in size. No orbital masses. Several  foci of T2 prolongation in corona radiata and centrum semiovale. Largest in the right frontal lobe measures 8 mm and in the left parietal  lobe millimeters 10 mm. Several additional smaller lesions are noted in the frontal and parietal lobes.       IMPRESSION: 1.   Nonspecific white matter hyperintensities likely due to chronic microvascular ischemic changes. Iatrogenic drug toxicity, graft-versus-host, inflammatory/infectious, hypertension, demyelinating process, trauma, chronic headaches, sequelae  of vasculitis are other considerations. 2. Low-lying cerebellar tonsils. 3. No orbital masses. No infarction.       Mri Brain W Cont      Result Date: 12/27/2017   MRI OF THE BRAIN INDICATION: diplopia.    COMPARISON: Noncontrast MRI 12/26/2017 TECHNIQUE: Multiplanar, multisequence MRI of the brain was performed without intravenous contrast.  FINDINGS: The patient returned for contrast-enhanced imaging of the brain.  Images are degraded by patient motion artifacts. However, there is no abnormal parenchymal, dural, or meningeal enhancement. No gross sellar mass.       IMPRESSION: 1.  No abnormal enhancement to  suggest active infectious/inflammatory/neoplastic process       Ct Head Wo Cont      Result Date: 12/25/2017   DICOM format image data is available to non-affiliated external healthcare facilities or entities on a secure, media free, reciprocally searchable basis with patient authorization for 12 months following the date of the study. Clinical history: Diplopia  EXAMINATION: Noncontrast CT scan of the head 12/24/2017. 4 mm axial scanning is performed from the skull base to the vertex. Coronal and sagittal reconstruction imaging has been obtained. Correlation: None FINDINGS: Visualized paranasal sinuses and mastoid  air cells are clear. No acute intracranial hemorrhage, mass or infarction.       IMPRESSION: Normal unenhanced CT scan of the head. Initial interpretation: [facility]       Cta Head      Result Date: 12/26/2017   CTA OF THE HEAD INDICATION: Nausea, sweating, vomiting, diarrhea COMPARISON: MRI 12/26/2017, CT 12/24/2017 TECHNIQUE: Noncontrast CT the head performed followed by CTA of the head with intravenous contrast. Multiplanar two-dimensional and three-dimensional  maximum intensity projection reformats performed and reviewed. DICOM format image data is available to non-affiliated external healthcare facilities or entities on a secure, media free, reciprocally searchable basis with patient authorization for 12 months  following the date of the study. FINDINGS: Noncontrast CT of the head demonstrates no acute segmental infarct, intracranial hemorrhage, mass, midline shift, extra-axial fluid collection, or hydrocephalus. The vertebral arteries are codominant. There is  no focal hemodynamically significant stenosis within the anterior or posterior circulation. The anterior and posterior communicating arteries appear patent. There is no definitive aneurysm.       IMPRESSION: No focal hemodynamically significant stenosis.       Cta Neck      Result Date: 12/26/2017   CTA OF THE NECK WITH CONTRAST  INDICATION: Speech difficulty, and balance COMPARISON: No relevant studies. TECHNIQUE: CTA of the neck was performed with nonionic intravenous contrast with multiplanar 2-dimensional and maximum intensity projection three-dimensional  images. Stenosis was evaluated using the NASCET criteria. DICOM format image data is available to non-affiliated external healthcare facilities or entities on a secure, media free, reciprocally searchable basis with patient authorization for 12 months  following the date of the study. FINDINGS: There is no hemodynamically significant stenosis within the carotid or vertebral arteries. There is no evidence of dissection. CAROTID STENOSIS REFERENCE USING NASCET CRITERIA % Stenosis = (1 - narrowest diameter/diameter  of distal artery)  x100 Mild: < 50% stenosis Moderate: 50-69% stenosis. Severe: 70-94% stenosis. Near occlusion: 95-99% stenosis. Occluded: 100% stenosis       IMPRESSION: No focal hemodynamically significant stenosis.          Microbiology:     All Micro Results               Procedure  Component  Value  Units  Date/Time           CULTURE, BLOOD [[id number]]  Collected:  12/30/17 1820            Order Status:  Completed  Specimen:  Blood from [facility]  Updated:  01/02/18 0704                Blood Culture Result  No Growth At 48 Hours                CULTURE, BLOOD [[id number]]  Collected:  12/30/17 1825            Order Status:  Completed  Specimen:  Blood from [facility]  Updated:  01/02/18 0704                Blood Culture Result  No Growth At 48 Hours                CULTURE, BLOOD [[id number]]  Collected:  12/28/17 0249            Order Status:  Completed  Specimen:  Blood  Updated:  01/02/18 0704                Blood Culture Result  No Growth at 5 days                CULTURE, BLOOD [[id number]]  (Abnormal)  Collected:  12/28/17 0249            Order Status:  Completed  Specimen:  Blood  Updated:  12/31/17 0721                Blood Culture Result  --                    Possible  contaminant. No further workup will be performed unless directly requested to the Microbiology department at ext. [phone] with 72  hours of this report.   Gram stain Gram Positive Cocci In Clusters   Called to/read back DEREK BOZMAN 6EST   on 12/29/2017   at 0600   by [facility]                    Isolate                 Coagulase Negative Staphylococcus                       CULTURE, CATHETER TIP [[id number]]  (Abnormal)  (Susceptibility)  Collected:  12/27/17 1550            Order Status:  Completed  Specimen:  Catheter Tip from PICC  Updated:  12/30/17 1106                Isolate  --                    >15 CFU   Staphylococcus epidermidis                CULTURE, BLOOD [[id number]]  Collected:  12/29/17 1830  Order Status:  Canceled  Specimen:  Blood             CULTURE, BLOOD [[id number]]  Collected:  12/29/17 1830            Order Status:  Canceled  Specimen:  Blood             CULTURE, BLOOD [[id number]]  (Abnormal)  Collected:  12/26/17 1240            Order Status:  Completed  Specimen:  Blood from [facility] Pic[facility]  Updated:  12/28/17 0915               Blood Culture Result                 Gram stain Gram Positive Cocci In Clusters                          Isolate  --                    Staphylococcus epidermidis   See Previous Culture For Susceptibility Report                CULTURE, BLOOD [[id number]]  (Abnormal)  Collected:  12/26/17 1246            Order Status:  Completed  Specimen:  Blood from PICC  Updated:  12/28/17 0913                Blood Culture Result  --                  Gram stain Gram Positive Cocci In Clusters   Called to/read back sissy   on 12/27/17   at [place]   by [id number]                   Isolate  --                    Staphylococcus epidermidis   See Previous Culture For Susceptibility Report                CULTURE, BLOOD [[id number]]  (Abnormal)  Collected:  12/24/17 2255            Order Status:  Completed  Specimen:  Blood from [facility]  Updated:  12/27/17 0910                Blood Culture Result   --                  Gram stain Gram Positive Cocci In Clusters   Called to/read back jannet   on 12/25/17   at 1751   by [id number]                   Isolate  --                    See Previous Culture For Susceptibility Report   Staphylococcus epidermidis                CULTURE, BLOOD [[id number]]  (Abnormal)  (Susceptibility)  Collected:  12/24/17 2230            Order Status:  Completed  Specimen:  Blood from [facility] Pic[facility]  Updated:  12/27/17 0749                Blood Culture Result  --  Gram stain Gram Positive Cocci In Clusters   Called to/read back jannet   on 12/25/17   at 1751   by [id number]                  Isolate                 Staphylococcus epidermidis                       CULTURE, BLOOD FOR MRSA/SA BY PCR [[id number]]  Collected:  12/24/17 0951            Order Status:  Completed  Specimen:  Blood  Updated:  12/26/17 1133                Blood culture, MRSA by PCR  NEGATIVE              Blood culture, S. aureus by PCR  NEGATIVE              Positive culture accn number  [id number]                  Comment:  This PCR testing was performed from the blood culture accession number that is noted. Refer to   this accession number for antibiotic susceptibility testing and final identification.                        CULTURE, URINE [[id number]]  (Abnormal)  Collected:  12/24/17 2255            Order Status:  Completed  Specimen:  Urine from Clean catch  Updated:  12/26/17 1021                Culture result  --                    30,000 CFU/mL   Mixed Flora, three or more different organisms present   No Further Workup                      Cardiology:     Results for orders placed or performed during the hospital encounter of 12/24/17     EKG, 12 LEAD, INITIAL         Result  Value  Ref Range            Ventricular Rate  104  BPM       Atrial Rate  104  BPM       P-R Interval  148  ms       QRS Duration  66  ms       Q-T Interval  328  ms       QTC Calculation (Bezet)  431  ms       Calculated P Axis  18   degrees       Calculated R Axis  17  degrees       Calculated T Axis  16  degrees       Diagnosis                 Sinus tachycardia   Otherwise normal ECG   No previous ECGs available   Confirmed by Marrion Coy, M.D., Ellin Saba. (30) on 12/25/2017 7:41:35 AM                 Problem List:      Problem List as of 01/02/2018  Date  Reviewed:  12/28/2017                        Codes  Class  Noted - Resolved             Bacteremia  ICD-10-CM: R78.81   ICD-9-CM: 790.7    12/26/2017 - Present                       TIA (transient ischemic attack)  ICD-10-CM: G45.9   ICD-9-CM: 435.9    12/25/2017 - Present                       Thrombocytopenia ([place])  ICD-10-CM: D69.6   ICD-9-CM: 287.5    05/13/2017 - Present                       Neutropenic fever (HCC)  ICD-10-CM: D70.9, R50.81   ICD-9-CM: 288.00, 780.61    05/13/2017 - Present                       Chest pressure  ICD-10-CM: R07.89   ICD-9-CM: 786.59    04/25/2017 - Present                       Elevated troponin  ICD-10-CM: R74.8   ICD-9-CM: 790.6    04/25/2017 - Present                          Signed:   Kristen Loader, MD   01/02/2018

## 2018-01-02 NOTE — Progress Notes (Signed)
Problem: Falls - Risk of  Goal: *Absence of Falls  Description  Document [name] Wu Fall Risk and appropriate interventions in the flowsheet.  Outcome: Progressing Towards Goal  Note:   Fall Risk Interventions:  Mobility Interventions: Assess mobility with egress test, Communicate number of staff needed for ambulation/transfer, Patient to call before getting OOB         Medication Interventions: Patient to call before getting OOB, Teach patient to arise slowly    Elimination Interventions: Call light in reach, Patient to call for help with toileting needs              Problem: Diabetes Self-Management  Goal: *Disease process and treatment process  Description  Define diabetes and identify own type of diabetes; list 3 options for treating diabetes.  Outcome: Progressing Towards Goal  Goal: *Incorporating nutritional management into lifestyle  Description  Describe effect of type, amount and timing of food on blood glucose; list 3 methods for planning meals.  Outcome: Progressing Towards Goal  Goal: *Incorporating physical activity into lifestyle  Description  State effect of exercise on blood glucose levels.  Outcome: Progressing Towards Goal  Goal: *Developing strategies to promote health/change behavior  Description  Define the ABC's of diabetes; identify appropriate screenings, schedule and personal plan for screenings.  Outcome: Progressing Towards Goal  Goal: *Using medications safely  Description  State effect of diabetes medications on diabetes; name diabetes medication taking, action and side effects.  Outcome: Progressing Towards Goal  Goal: *Monitoring blood glucose, interpreting and using results  Description  Identify recommended blood glucose targets  and personal targets.  Outcome: Progressing Towards Goal  Goal: *Prevention, detection, treatment of acute complications  Description  List symptoms of hyper- and hypoglycemia; describe how to treat low blood  sugar and actions for lowering  high blood glucose level.  Outcome: Progressing Towards Goal  Goal: *Prevention, detection and treatment of chronic complications  Description  Define the natural course of diabetes and describe the relationship of blood glucose levels to long term complications of diabetes.  Outcome: Progressing Towards Goal  Goal: *Developing strategies to address psychosocial issues  Description  Describe feelings about living with diabetes; identify support needed and support network  Outcome: Progressing Towards Goal  Goal: *Sick day guidelines  Outcome: Progressing Towards Goal     Problem: Gas Exchange - Impaired  Goal: *Absence of hypoxia  Outcome: Progressing Towards Goal     Problem: Gas Exchange - Impaired  Goal: *Absence of hypoxia  Outcome: Progressing Towards Goal

## 2018-01-02 NOTE — Progress Notes (Signed)
Discharge Plan:   home    Discharge Date:     01/02/2018     Home Health Needed:     yes    Home Health Agency:    [facility]    Sun given : [facility]    Confirmed start of care with the home health agency and spoke with:  Message left on Amy's voicemail     DME Company:    [facility] [employer]    CM confirmed delivery of DME: with      [employer]     Transportation: Family will transport.    Received call from [facility] who said per nursing they can not come out until 0800 tomorrow.

## 2018-01-02 NOTE — Discharge Summary (Signed)
Physician Discharge Summary     Patient ID:  Patient Name: [name] Wu  Medical Record Number: [id number]  Date of Birth: 10-22-1960  Age: 57 y.o.    Primary Care Provider: Other, Phys, MD    Code Status: Full Code     Admit date: 12/24/2017    Discharge Date:  01/02/2018    Admitting Physician: Einar Grad, MD    Discharge Physician: Kristen Loader, MD    Discharge Disposition: Home    Activity: Activity as tolerated    Diet: Diabetic Diet    Follow-up appointments:     Follow-up Information     Follow up With Specialties Details [facility]   [facility]  [phone]    [facility]    [phone]    Other, Phys, MD    Patient can only remember the practice name and not the physician          -Oncology/PCP: Dr Aldona Bar 1 week    -[place] in [place]: Monday as scheduled    Follow-up recommendations:    ??? Lab: Renal panel, CBC, mag level, phos level    Patient Instructions:   Current Discharge Medication List      START taking these medications    Details   nystatin (MYCOSTATIN) 100,000 unit/mL suspension Take 5 mL by mouth four (4) times daily. swish and spit  Qty: 1 mL, Refills: 0      benzonatate (TESSALON) 100 mg capsule Take 1 Cap by mouth three (3) times daily as needed for Cough.  Qty: 30 Cap, Refills: 0      benzocaine-menthol (CEPACOL) 15-3.6 mg lozg lozenge 1 Lozenge by Mucous Membrane route as needed (sore throat).  Qty: 60 Lozenge, Refills: 0      aspirin 81 mg chewable tablet Take 1 Tab by mouth daily.  Qty: 30 Tab, Refills: 1      albuterol (PROVENTIL VENTOLIN) 2.5 mg /3 mL (0.083 %) nebu 3 mL by Nebulization route every four (4) hours as needed (wheezing).  Qty: 120 Nebule, Refills: 1      vancomycin 10 gram 1,250 mg IVPB 1,250 mg by IntraVENous route every twelve (12) hours every twelve (12) hours for 10 days. Vanco dosing per pharmacy- vanco trough goal between 15-20  Qty: 20 Dose, Refills: 0          CONTINUE these medications which have NOT CHANGED    Details   DULoxetine (CYMBALTA) 20 mg capsule Take 20 mg by mouth daily. Indications: Patient stated to eliminated bone pain      magnesium oxide (MAG-OX) 400 mg tablet Take 400 mg by mouth two (2) times a day.      pantoprazole (PROTONIX) 40 mg tablet Take 40 mg by mouth daily.      prochlorperazine (COMPAZINE) 10 mg tablet Take 10 mg by mouth every four (4) hours as needed.      budesonide (ENTOCORT EC) 3 mg capsule Take 6 mg by mouth three (3) times daily.      nilotinib (TASIGNA) 200 mg cap capsule Take 200 mg by mouth two (2) times a day.      Magnesium Oxide-Mg AA Chelate (MG-PLUS) 133 mg tab Take 1 Tab by mouth three (3) times daily.      magnesium sulfate in water (MAGNESIUM SULFATE 2 G/50 ML) 2 gram/50 mL (4 %) IVPB 2-4 g by IntraVENous route as needed.  2 gm IV over 2 hours for magnesium level 1.5-1.7  4gm IV over 4 hours for magnesium level < 1.5  HOLD if magnesium >= 1.8  Infusion based on results drawn on Mondays and Thursdays. May be given daily up to 2x/week   Indications: low amount of magnesium in the blood      cholecalciferol, VITAMIN D3, (VITAMIN D3) 5,000 unit tab tablet Take 5,000 Units by mouth daily.      0.9 % sodium chloride (SODIUM CHLORIDE 0.9 % IV) 1,000 mL by IntraVENous route See Admin Instructions. 1 L IV up to 2 times a week.  IVF to be given at 250 ml/hr over 4 hours if creatinine > or = 1.3 when drawn on Mondays and Thursdays      tacrolimus (PROGRAF) 0.5 mg capsule Take 4 mg by mouth two (2) times a day.      trimethoprim-sulfamethoxazole (BACTRIM DS) 160-800 mg per tablet Take 1 Tab by mouth See Admin Instructions. 1 tab orally Monday, Wednesday, Friday      oxyCODONE IR (ROXICODONE) 5 mg immediate release tablet Take 2 Tabs by mouth every four (4) hours as needed. Max Daily Amount: 60 mg.  Qty: 60 Tab, Refills: 0    Associated Diagnoses: Chest pressure      acyclovir (ZOVIRAX) 400 mg tablet Take 400 mg by mouth two (2) times a  day.      insulin lispro (HUMALOG JUNIOR KWIKPEN U-100) 100 unit/mL inph by SubCUTAneous route three (3) times daily.      potassium chloride SR (K-TAB) 20 mEq tablet Take 40 mEq by mouth two (2) times a day.      insulin degludec (TRESIBA FLEXTOUCH U-200) 200 unit/mL (3 mL) inpn 30 Units by SubCUTAneous route daily.      zolpidem (AMBIEN) 5 mg tablet Take 5 mg by mouth nightly.             Admission Diagnoses: TIA (transient ischemic attack) [G45.9]  Bacteremia [R78.81]    Discharge Diagnoses: see below    Admission Condition: fair    Discharged Condition: good    Hospital Course:     Probable TIA  Pt has multiple positive BCx with Staph epi.  PICC tip was positive for S epi.  Pt with TIA and faint murmur on exam.  TEE 8/20 was negative for vegetations.   BE ruled out  Neuro rec ASA if not contraindicated (pt with some thrombocytopenia)  ??  Staph epidermidis Sepsis with CLABSI  Immunocompromised pt  TEE 8/20 was negative for vegetations  ID is following  Pt is on Bactrim TIW and Vancomycin  BCx 8/20 NGTD  d/w ID, plan on d/c home with OPAT (Vanco)  ??  Diplopia with transient slurred speech, LUE weakness, gait disturbances.   Probable TIA  Work-up as above  Sx have resolved.  LDL 88 (8/16), HbA1C 7 (8/16)  PT/OT  ??  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  I spoke with her lead BMT oncologist and he noted to continue treatment here for infxn and once stable for discharge to d/c home so she can go to [place] on her own.  Pt is on Tasigna 217m BID  ??  Esophagitis and diarrhea d/t GVH dz   Seems to have improved.   Pt is on Compazine PRN  ??  Oral thrush.  Added Nystatin (  8/19)  Possible GVH, but will treat  ??  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   DM2 uncontrolled with hyperglycemia  Glucomander while inpt  Resume home meds  ??  Hypomagnesemia.  Repleted prior to d/c  Pt noted she gets routine IV Mag  Continue PO magOx.  ??  Chronic pain  APAP and Oxycodone PRN  ??  DVT Prophylaxis.  SCDs  No heparin due to plt <100  ??  Code status. Full code.  ??  Disposition.  S/p TEE 8/20 which was neg for vegetations  PICC line planned  D/c home 8/23 and have pt f/u with her Oncologists in [place], [place] soon after    ------------------    65 minutes were spent on the discharge of the patient of which more than 50% was spent in coordination of care and counseling (time spent with patient/family face to face, physical exam, reviewing laboratory and imaging investigations, speaking with physicians and nursing staff involved in this patient's care)    ------------------  Physical exam:  General:  Alert, NAD, pleasant.  Cooperative  HEENT: Sclera anicteric, PERRL, OM moist, throat clear.  Chest:  CTA B, no wheeze, no rales, no rhonchi.  Good air movement.  CV: RRR, S1/S2 were normal, no murmur  Abdomen: NTND, soft, NABS, no masses were noted.  No rebound, no guarding.  Extremities: No edema.    Subjective:     Pt said she feels well and will f/u with her Oncologist in [place] soon (likely early next week         Visit Vitals  BP 124/67 (BP 1 Location: Left arm, BP Patient Position: Sitting)   Pulse 82   Temp 97.8 ??F (36.6 ??C)   Resp 17   Ht 5' (1.524 m)   Wt 67.5 kg (148 lb 13 oz)   SpO2 100%   BMI 22.63 kg/m??         Initial presentation and work-up:  [As per Dr  Christen Bame Patel's H+P from 12/25/2017]  #############  "  Chief Complaint:    ??  Diplopia  ??  History of Present Illness:    ??  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  She stated this started on Monday with initial blurry vision and left arm weakness with mild slurred speech.  At the same time, she  noted blood sugar around 70.  She contacted her oncologist at the [facility] in [place] who thought this was hypoglycemic related and advised her to increase her glucose intake.  Her slurred speech and left arm weakness improved however had continued blurred vision that transitioned to double vision. In addition, she has gait disturbances associated with her diplopia.  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  She follows up with an oncologist at [facility] in [place] and has seen Dr. Edd Arbour here at [facility].  No chest pain, shortness of breath, abdominal pain, vomiting, leg pain, or swelling.  No rashes.  ??  Past Medical History:   ??       Past Medical History:   Diagnosis Date   ??? Diabetes ([place]) ??   ??? 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   ??? H/O stem cell transplant ([place]) 041/04/19   ??? Hx antineoplastic chemotherapy ??   ??? Hypertension ??   ??? Leukemia ([facility]) ??   ??  ??  Past Social History:   ??  Patient denies any alcohol, tobacco, or illicit drug use. Patient is divorced and has no children.  She currently does not work.  She is a fan of the [employer] and does not like the [place]  ??  Family History:   ??  Reviewed and noncontributory  ??  Allergies:   ??       Allergies   Allergen Reactions   ??? Pcn [Penicillins] Hives and Itching   ??  ??  Home Medications:   ??          Prior to Admission medications    Medication Sig Start Date End Date Taking? Authorizing Provider   prochlorperazine (COMPAZINE) 10 mg tablet Take 10 mg by mouth every four (4) hours as needed. ?? ?? Yes Other, Phys, MD   budesonide (ENTOCORT EC) 3 mg capsule Take 6 mg by mouth three (3) times daily. ?? ?? Yes Other, Phys, MD   nilotinib (TASIGNA) 200 mg cap capsule Take 200 mg by mouth two (2) times a day. ?? ?? Yes Other, Phys, MD   Magnesium Oxide-Mg AA Chelate (MG-PLUS) 133 mg tab Take 1 Tab by mouth  three (3) times daily. ?? ?? Yes Other, Phys, MD   magnesium sulfate in water (MAGNESIUM SULFATE 2 G/50 ML) 2 gram/50 mL (4 %) IVPB 2-4 g by IntraVENous route as needed. 2 gm IV over 2 hours for magnesium level 1.5-1.7  4gm IV over 4 hours for magnesium level < 1.5  HOLD if magnesium >= 1.8  Infusion based on results drawn on Mondays and Thursdays. May be given daily up to 2x/week   Indications: low amount of magnesium in the blood ?? ?? Yes Other, Phys, MD   cholecalciferol, VITAMIN D3, (VITAMIN D3) 5,000 unit tab tablet Take 5,000 Units by mouth daily. ?? ?? Yes Other, Phys, MD   0.9 % sodium chloride (SODIUM CHLORIDE 0.9 % IV) 1,000 mL by IntraVENous route See Admin Instructions. 1 L IV up to 2 times a week.  IVF to be given at 250 ml/hr over 4 hours if creatinine > or = 1.3 when drawn on Mondays and Thursdays ?? ?? Yes Other, Phys, MD   tacrolimus (PROGRAF) 0.5 mg capsule Take 4 mg by mouth two (2) times a day. ?? ?? Yes Other, Phys, MD   trimethoprim-sulfamethoxazole (BACTRIM DS) 160-800 mg per tablet Take 1 Tab by mouth See Admin Instructions. 1 tab orally Monday, Wednesday, Friday ?? ?? Yes Other, Phys, MD   oxyCODONE IR (ROXICODONE) 5 mg immediate release tablet Take 2 Tabs by mouth every four (4) hours as needed. Max Daily Amount: 60 mg. 04/26/17 ?? Yes Tonny Bollman, MD   acyclovir (ZOVIRAX) 400 mg tablet Take 400 mg by mouth two (2) times a day. ?? ?? Yes Other, Phys, MD   insulin lispro (HUMALOG JUNIOR KWIKPEN U-100) 100 unit/mL inph by SubCUTAneous route three (3) times daily. ?? ?? Yes Other, Phys, MD   potassium chloride SR (K-TAB) 20 mEq tablet Take 40 mEq by mouth two (2) times a day. ?? ?? Yes Other, Phys, MD   insulin degludec (TRESIBA FLEXTOUCH U-200) 200 unit/mL (3 mL) inpn 30 Units by SubCUTAneous route daily. ?? ?? Yes Other, Phys, MD  zolpidem (AMBIEN) 5 mg tablet Take 5 mg by mouth nightly. ?? ?? Yes Other, Phys, MD   ??  ??  Review of Systems:   ??  POSITIVES ARE HIGHLIGHTED IN RED  ??   General: fevers, chills, sweats, fatigue, malaise, wt loss, wt gain, weakness  Skin: skin changes  HEENT: bleeding gums, cataracts, decreased hearing, dizziness, dry mouth, ear discharge, ear pain, epistaxis, eye discharge, eye itching, eye pain, eye redness, headache, head injury, hoarseness, nasal congestion, oral ulcers, postnasal drip, rhinitis, ringing in the ears, sinus pain, throat itchiness, throat pain, throat soreness, toothache, vertigo, vision changes  Neck: decreased ROM, neck mass, neck pain, neck stiffness, swollen glands  Respiratory: cough, dyspnea, sputum, wheezing, pleurisy  CV: chest pain, edema, orthopnea, palpitations, PND  GI: abd pain, anorexia, constipation, diarrhea, dysphagia, hemorrhoids, hematochezia, melena, nausea, vomiting, reflux  GU: dysuria, frequency, hesitancy, incontinence, nocturia, polyuria, urgency  MS:  pain, stiffness, decreased ROM, leg cramps  Neuro: LOC, paralysis, paresthesias, seizures, syncope, stroke, tremor  ??  Physical Examination:   ??  Visit Vitals  BP 119/77   Pulse 89   Temp 98.8 ??F (37.1 ??C)   Resp 18   Ht 5' (1.524 m)   Wt 65.3 kg (144 lb)   SpO2 99%   BMI 28.12 kg/m??   ??  General No acute distress.  Alert and oriented x3   Head/face Normocephalic.  Atraumatic   Neck Supple.  No evidence of jugular venous distention   Eyes No scleral icterus noted.  No conjunctival pallor observed   Cardiac Regular rate and rhythm.  S1 and S2 heard.  No murmurs noted   Chest Symmetrical chest rise.  No tenderness to palpation   Respiratory Clear to auscultation bilaterally.  No wheezing or crackles noted   Abdomen Soft.  Nontender and nondistended.  Bowel sounds present   Extremities No gross lower or upper extremity edema noted.  No obvious erythema noted   Skin No rashes or lesions noted on face, arms, or legs   Back No focal back tenderness on palpation.  No gross bony deformities noted   Neurological No focal neurological deficits noted. No cranial nerves grossly intact       "  #############    ACUTE DIAGNOSES:  TIA (transient ischemic attack) [G45.9]  Bacteremia [R78.81]    CHRONIC MEDICAL DIAGNOSES:  Problem List as of 01/02/2018 Date Reviewed: December 30, 2017          Codes Class Noted - Resolved    Bacteremia ICD-10-CM: R78.81  ICD-9-CM: 790.7  12/26/2017 - Present        TIA (transient ischemic attack) ICD-10-CM: G45.9  ICD-9-CM: 435.9  12/25/2017 - Present        Thrombocytopenia ([place]) ICD-10-CM: D69.6  ICD-9-CM: 287.5  05/13/2017 - Present        Neutropenic fever ([place]) ICD-10-CM: D70.9, R50.81  ICD-9-CM: 288.00, 780.61  05/13/2017 - Present        Chest pressure ICD-10-CM: R07.89  ICD-9-CM: 786.59  04/25/2017 - Present        Elevated troponin ICD-10-CM: R74.8  ICD-9-CM: 790.6  04/25/2017 - Present                Data Review:       Recent Days:  Recent Labs     01/01/18  0327   WBC 4.4   HGB 9.6*   HCT 28.5*   PLT 115*     Recent Labs     01/01/18  0327  NA 144   K 4.9   CL 110*   CO2 27   GLU 198*   BUN 14   CREA 1.0   CA 8.6   MG 1.3*   PHOS 3.2   ALB 3.0*     No results for input(s): PH, PCO2, PO2, HCO3, FIO2 in the last 72 hours.    24 Hour Results:  Recent Results (from the past 24 hour(s))   GLUCOSE, POC    Collection Time: 01/01/18 10:18 PM   Result Value Ref Range    Glucose (POC) 327 (H) 65 - 105 mg/dL   GLUCOSE, POC    Collection Time: 01/02/18 10:01 AM   Result Value Ref Range    Glucose (POC) 210 (H) 65 - 105 mg/dL   GLUCOSE, POC    Collection Time: 01/02/18  2:15 PM   Result Value Ref Range    Glucose (POC) 200 (H) 65 - 105 mg/dL       Xr Chest Pa Lat    Result Date: 12/31/2017  Chest Indication: Dyspnea on exertion.    Comparison: 12/24/2017 Findings: Frontal and lateral views of the chest demonstrate that the cardiac silhouette is not enlarged.  There is no focal infiltrate, pleural effusion, vascular congestion, or pneumothorax.         Impression: No acute cardiopulmonary disease.     Xr Chest Pa Lat    Result Date: 12/24/2017   Chest Indication: hx of GVHD with N/V/D.    Comparison: 05/20/2017 Findings: Frontal and lateral views of the chest demonstrate that the cardiac silhouette is not enlarged.  There is no focal infiltrate, pleural effusion, vascular congestion, or pneumothorax.  Left PICC with tip overlying the distal superior vena cava.     Impression: No acute cardiopulmonary disease.     Mri Brain Wo Cont    Result Date: 12/26/2017  EXAMINATION: MRI BRAIN WO CONT CLINICAL INDICATION: Diplopia; TIA bone marrow transplant. Blurred vision COMPARISON:  None. TECHNIQUE: T1 and T2-weighted sequences of the brain were obtained in multiple planes without contrast. FINDINGS: No restricted diffusion. No masses, no midline shift. No hydrocephalus. No extra-axial fluid collections. Visualized portions of the orbits grossly unremarkable. Mastoid air cells well aerated. Visualized portions of paranasal sinuses unremarkable. Intracranial flow voids of great vessels, grossly intact. Diffuse low signal marrow. Age-appropriate atrophy. No susceptibility artifact on gradient echo imaging to suggest hemorrhage. Cerebellar tonsil extends into the foramen magnum 2 mm or so. Pituitary gland normal. Orbits appear normal. No compression of optic chiasm extraocular muscles are normal in size. No orbital masses. Several foci of T2 prolongation in corona radiata and centrum semiovale. Largest in the right frontal lobe measures 8 mm and in the left parietal lobe millimeters 10 mm. Several additional smaller lesions are noted in the frontal and parietal lobes.     IMPRESSION: 1.   Nonspecific white matter hyperintensities likely due to chronic microvascular ischemic changes. Iatrogenic drug toxicity, graft-versus-host, inflammatory/infectious, hypertension, demyelinating process, trauma, chronic headaches, sequelae of vasculitis are other considerations. 2. Low-lying cerebellar tonsils. 3. No orbital masses. No infarction.     Mri Brain W Cont     Result Date: 12/27/2017  MRI OF THE BRAIN INDICATION: diplopia.    COMPARISON: Noncontrast MRI 12/26/2017 TECHNIQUE: Multiplanar, multisequence MRI of the brain was performed without intravenous contrast.  FINDINGS: The patient returned for contrast-enhanced imaging of the brain. Images are degraded by patient motion artifacts. However, there is no abnormal parenchymal, dural, or meningeal enhancement. No gross sellar  mass.     IMPRESSION: 1.  No abnormal enhancement to suggest active infectious/inflammatory/neoplastic process     Ct Head Wo Cont    Result Date: 12/25/2017  DICOM format image data is available to non-affiliated external healthcare facilities or entities on a secure, media free, reciprocally searchable basis with patient authorization for 12 months following the date of the study. Clinical history: Diplopia EXAMINATION: Noncontrast CT scan of the head 12/24/2017. 4 mm axial scanning is performed from the skull base to the vertex. Coronal and sagittal reconstruction imaging has been obtained. Correlation: None FINDINGS: Visualized paranasal sinuses and mastoid air cells are clear. No acute intracranial hemorrhage, mass or infarction.     IMPRESSION: Normal unenhanced CT scan of the head. Initial interpretation: [facility]     Cta Head    Result Date: 12/26/2017  CTA OF THE HEAD INDICATION: Nausea, sweating, vomiting, diarrhea COMPARISON: MRI 12/26/2017, CT 12/24/2017 TECHNIQUE: Noncontrast CT the head performed followed by CTA of the head with intravenous contrast. Multiplanar two-dimensional and three-dimensional maximum intensity projection reformats performed and reviewed. DICOM format image data is available to non-affiliated external healthcare facilities or entities on a secure, media free, reciprocally searchable basis with patient authorization for 12 months following the date of the study. FINDINGS: Noncontrast CT of the head demonstrates no acute segmental infarct,  intracranial hemorrhage, mass, midline shift, extra-axial fluid collection, or hydrocephalus. The vertebral arteries are codominant. There is no focal hemodynamically significant stenosis within the anterior or posterior circulation. The anterior and posterior communicating arteries appear patent. There is no definitive aneurysm.     IMPRESSION: No focal hemodynamically significant stenosis.     Cta Neck    Result Date: 12/26/2017  CTA OF THE NECK WITH CONTRAST INDICATION: Speech difficulty, and balance COMPARISON: No relevant studies. TECHNIQUE: CTA of the neck was performed with nonionic intravenous contrast with multiplanar 2-dimensional and maximum intensity projection three-dimensional images. Stenosis was evaluated using the NASCET criteria. DICOM format image data is available to non-affiliated external healthcare facilities or entities on a secure, media free, reciprocally searchable basis with patient authorization for 12 months following the date of the study. FINDINGS: There is no hemodynamically significant stenosis within the carotid or vertebral arteries. There is no evidence of dissection. CAROTID STENOSIS REFERENCE USING NASCET CRITERIA % Stenosis = (1 - narrowest diameter/diameter of distal artery) x100 Mild: < 50% stenosis Moderate: 50-69% stenosis. Severe: 70-94% stenosis. Near occlusion: 95-99% stenosis. Occluded: 100% stenosis     IMPRESSION: No focal hemodynamically significant stenosis.       Microbiology:  All Micro Results     Procedure Component Value Units Date/Time    CULTURE, BLOOD [[id number]] Collected:  12/30/17 1820    Order Status:  Completed Specimen:  Blood from [facility] Picc Updated:  01/02/18 0704     Blood Culture Result No Growth At 48 Hours       CULTURE, BLOOD [[id number]] Collected:  12/30/17 1825    Order Status:  Completed Specimen:  Blood from [facility] Pic[facility] Updated:  01/02/18 0704     Blood Culture Result No Growth At 48 Hours        CULTURE, BLOOD [[id number]] Collected:  12/28/17 0249    Order Status:  Completed Specimen:  Blood Updated:  01/02/18 0704     Blood Culture Result No Growth at 5 days       CULTURE, BLOOD [[id number]]  (Abnormal) Collected:  12/28/17 0249    Order Status:  Completed Specimen:  Blood Updated:  12/31/17  0721     Blood Culture Result --        Possible contaminant. No further workup will be performed unless directly requested to the Microbiology department at ext. [phone] with 72 hours of this report.  Gram stain Gram Positive Cocci In Clusters  Called to/read back DEREK BOZMAN 6EST  on 12/29/2017  at 0600  by V[facility]       Isolate       Coagulase Negative Staphylococcus          CULTURE, CATHETER TIP [[id number]]  (Abnormal)  (Susceptibility) Collected:  12/27/17 1550    Order Status:  Completed Specimen:  Catheter Tip from PICC Updated:  12/30/17 1106     Isolate --        >15 CFU  Staphylococcus epidermidis      CULTURE, BLOOD [[id number]] Collected:  12/29/17 1830    Order Status:  Canceled Specimen:  Blood     CULTURE, BLOOD [[id number]] Collected:  12/29/17 1830    Order Status:  Canceled Specimen:  Blood     CULTURE, BLOOD [[id number]]  (Abnormal) Collected:  12/26/17 1240    Order Status:  Completed Specimen:  Blood from [facility] Picc Updated:  12/28/17 0915     Blood Culture Result       Gram stain Gram Positive Cocci In Clusters           Isolate --        Staphylococcus epidermidis  See Previous Culture For Susceptibility Report      CULTURE, BLOOD [[id number]]  (Abnormal) Collected:  12/26/17 1246    Order Status:  Completed Specimen:  Blood from PICC Updated:  12/28/17 0913     Blood Culture Result --        Gram stain Gram Positive Cocci In Clusters  Called to/read back sissy  on 12/27/17  at [place]  by [id number]       Isolate --        Staphylococcus epidermidis  See Previous Culture For Susceptibility Report      CULTURE, BLOOD [[id number]]  (Abnormal) Collected:  12/24/17 2255     Order Status:  Completed Specimen:  Blood from [facility] Picc Updated:  12/27/17 0910     Blood Culture Result --        Gram stain Gram Positive Cocci In Clusters  Called to/read back jannet  on 12/25/17  at 1751  by [id number]       Isolate --        See Previous Culture For Susceptibility Report  Staphylococcus epidermidis      CULTURE, BLOOD [[id number]]  (Abnormal)  (Susceptibility) Collected:  12/24/17 2230    Order Status:  Completed Specimen:  Blood from [facility] Picc Updated:  12/27/17 0749     Blood Culture Result --        Gram stain Gram Positive Cocci In Clusters  Called to/read back jannet  on 12/25/17  at 1751  by [id number]       Isolate       Staphylococcus epidermidis          CULTURE, BLOOD FOR MRSA/SA BY PCR [[id number]] Collected:  12/24/17 0951    Order Status:  Completed Specimen:  Blood Updated:  12/26/17 1133     Blood culture, MRSA by PCR NEGATIVE        Blood culture, S. aureus by PCR NEGATIVE        Positive culture accn number [id number]  Comment: This PCR testing was performed from the blood culture accession number that is noted. Refer to  this accession number for antibiotic susceptibility testing and final identification.         CULTURE, URINE [[id number]]  (Abnormal) Collected:  12/24/17 2255    Order Status:  Completed Specimen:  Urine from Clean catch Updated:  12/26/17 1021     Culture result --        30,000 CFU/mL  Mixed [place], three or more different organisms present  No Further Workup             Cardiology:  Results for orders placed or performed during the hospital encounter of 12/24/17   EKG, 12 LEAD, INITIAL   Result Value Ref Range    Ventricular Rate 104 BPM    Atrial Rate 104 BPM    P-R Interval 148 ms    QRS Duration 66 ms    Q-T Interval 328 ms    QTC Calculation (Bezet) 431 ms    Calculated P Axis 18 degrees    Calculated R Axis 17 degrees    Calculated T Axis 16 degrees    Diagnosis       Sinus tachycardia  Otherwise normal ECG  No previous ECGs available   Confirmed by Marrion Coy, M.D., Ellin Saba. ([id number]) on 12/25/2017 7:41:35 AM           Problem List:  Problem List as of 01/02/2018 Date Reviewed: Jan 19, 2018          Codes Class Noted - Resolved    Bacteremia ICD-10-CM: R78.81  ICD-9-CM: 790.7  12/26/2017 - Present        TIA (transient ischemic attack) ICD-10-CM: G45.9  ICD-9-CM: 435.9  12/25/2017 - Present        Thrombocytopenia ([place]) ICD-10-CM: D69.6  ICD-9-CM: 287.5  05/13/2017 - Present        Neutropenic fever (HCC) ICD-10-CM: D70.9, R50.81  ICD-9-CM: 288.00, 780.61  05/13/2017 - Present        Chest pressure ICD-10-CM: R07.89  ICD-9-CM: 786.59  04/25/2017 - Present        Elevated troponin ICD-10-CM: R74.8  ICD-9-CM: 790.6  04/25/2017 - Present              Signed:  Kristen Loader, MD  01/02/2018

## 2018-01-02 NOTE — Progress Notes (Signed)
Jozelyn Coram intake specialist @ [phone] requested updated IB abx orders with stop date, received orders from Dr. Glendora Score and faxed to [facility] via [place].

## 2018-01-02 NOTE — Other (Addendum)
----------  DocumentID: [id number]------------------------------------------------              [facility]                       Patient Education Report         Name: LUMI, WINSLETT                  Date: 12/25/2017    MRN: [id number]                    Time: 3:38:54 PM         Patient ordered video: 'Patient Safety: Stay Safe While you are in the Hospital'    from [id number] via phone number: [phone] at 3:38:54 PM    Description: This program outlines some of the precautions patients can take to ensure a speedy recovery without extra complications. The video emphasizes the importance of communicating with the healthcare team.    ----------DocumentID: [id number]------------------------------------------------                       [facility]          Patient Education Report - Discharge Summary        Date: 01/02/2018   Time: 5:31:54 PM   Name: THEODORA, LALANNE   MRN: [id number]      Account Number: [id number]      Education History:        Patient ordered video: 'Patient Safety: Stay Safe While you are in the Hospital' from [id number] on 12/25/2017 03:38:54 PM

## 2018-01-03 LAB — CULTURE, BLOOD

## 2018-01-03 LAB — CULTURE, BLOOD 1

## 2018-01-05 LAB — MUSK ANTIBODIES

## 2018-01-05 LAB — CULTURE, BLOOD
Blood Culture Result: NO GROWTH
Blood Culture Result: NO GROWTH

## 2018-01-05 LAB — CULTURE, BLOOD 1
BLOOD CULTURE RESULT: NO GROWTH
BLOOD CULTURE RESULT: NO GROWTH

## 2018-01-09 ENCOUNTER — Inpatient Hospital Stay: Admit: 2018-01-09 | Payer: BLUE CROSS/BLUE SHIELD | Primary: Internal Medicine

## 2018-01-09 DIAGNOSIS — C9100 Acute lymphoblastic leukemia not having achieved remission: Secondary | ICD-10-CM

## 2018-01-09 LAB — CBC WITH AUTOMATED DIFF
ATYPICAL LYMPHS: 1 % — ABNORMAL HIGH (ref 0–0)
Anisocytosis: 1
BASOPHILS: 0.9 % (ref 0–3)
EOSINOPHILS: 0.9 % (ref 0–5)
HCT: 29.4 % — ABNORMAL LOW (ref 37.0–50.0)
HGB: 9.7 gm/dl — ABNORMAL LOW (ref 13.0–17.2)
HYPOCHROMASIA: 0
LYMPHOCYTES: 6.7 % — ABNORMAL LOW (ref 28–48)
MCH: 34.4 pg (ref 25.4–34.6)
MCHC: 33 gm/dl (ref 30.0–36.0)
MCV: 104.3 fL — ABNORMAL HIGH (ref 80.0–98.0)
MONOCYTES: 8.6 % (ref 1–13)
MPV: 9.7 fL (ref 6.0–10.0)
Macrocytes: 1
Microcytes: 0
NEUTROPHILS: 81.9 % — ABNORMAL HIGH (ref 34–64)
NRBC: 0 (ref 0–0)
PLATELET: 115 10*3/uL — ABNORMAL LOW (ref 140–450)
Poikilocytosis: 0
Polychromasia: 0
RBC: 2.82 M/uL — ABNORMAL LOW (ref 3.60–5.20)
RDW-SD: 61.8 — ABNORMAL HIGH (ref 36.4–46.3)
Smudge cells: 1.9
WBC: 5.8 10*3/uL (ref 4.0–11.0)

## 2018-01-09 LAB — METABOLIC PANEL, COMPREHENSIVE
ALT (SGPT): 28 U/L (ref 12–78)
AST (SGOT): 18 U/L (ref 15–37)
Albumin: 3 gm/dl — ABNORMAL LOW (ref 3.4–5.0)
Alk. phosphatase: 75 U/L (ref 45–117)
Anion gap: 6 mmol/L (ref 5–15)
BUN: 14 mg/dl (ref 7–25)
Bilirubin, total: 0.8 mg/dl (ref 0.2–1.0)
CO2: 27 mEq/L (ref 21–32)
Calcium: 8.5 mg/dl (ref 8.5–10.1)
Chloride: 109 mEq/L — ABNORMAL HIGH (ref 98–107)
Creatinine: 0.8 mg/dl (ref 0.6–1.3)
GFR est AA: >60
GFR est non-AA: >60
Glucose: 94 mg/dl (ref 74–106)
Potassium: 3.5 mEq/L (ref 3.5–5.1)
Protein, total: 6.3 gm/dl — ABNORMAL LOW (ref 6.4–8.2)
Sodium: 142 mEq/L (ref 136–145)

## 2018-01-09 LAB — MAGNESIUM
Magnesium: 1.2 mg/dl — ABNORMAL LOW (ref 1.6–2.6)
Magnesium: 1.2 mg/dl — ABNORMAL LOW (ref 1.6–2.6)

## 2018-01-09 LAB — COMPREHENSIVE METABOLIC PANEL
ALT: 28 U/L (ref 12–78)
AST: 18 U/L (ref 15–37)
Albumin: 3 gm/dl — ABNORMAL LOW (ref 3.4–5.0)
Alkaline Phosphatase: 75 U/L (ref 45–117)
Anion Gap: 6 mmol/L (ref 5–15)
BUN: 14 mg/dl (ref 7–25)
CO2: 27 mEq/L (ref 21–32)
Calcium: 8.5 mg/dl (ref 8.5–10.1)
Chloride: 109 mEq/L — ABNORMAL HIGH (ref 98–107)
Creatinine: 0.8 mg/dl (ref 0.6–1.3)
GFR African American: >60
Glucose: 94 mg/dl (ref 74–106)
Potassium: 3.5 mEq/L (ref 3.5–5.1)
Sodium: 142 mEq/L (ref 136–145)
Total Bilirubin: 0.8 mg/dl (ref 0.2–1.0)
Total Protein: 6.3 gm/dl — ABNORMAL LOW (ref 6.4–8.2)
eGFR NON-AA: >60

## 2018-01-09 LAB — CBC WITH AUTO DIFFERENTIAL
Anisocytosis: 1
Atypical Lymphocytes: 1 % — ABNORMAL HIGH (ref 0–0)
Basophils %: 0.9 % (ref 0–3)
Eosinophils %: 0.9 % (ref 0–5)
Hematocrit: 29.4 % — ABNORMAL LOW (ref 37.0–50.0)
Hemoglobin: 9.7 gm/dl — ABNORMAL LOW (ref 13.0–17.2)
Hypochromasia: 0
Lymphocytes %: 6.7 % — ABNORMAL LOW (ref 28–48)
MACROCYTES, MACROCYT: 1
MCH: 34.4 pg (ref 25.4–34.6)
MCHC: 33 gm/dl (ref 30.0–36.0)
MCV: 104.3 fL — ABNORMAL HIGH (ref 80.0–98.0)
MICROCYTES: 0
MPV: 9.7 fL (ref 6.0–10.0)
Monocytes %: 8.6 % (ref 1–13)
Neutrophils %: 81.9 % — ABNORMAL HIGH (ref 34–64)
Nucleated RBCs: 0 (ref 0–0)
POLYCHROMASIA: 0
Platelets: 115 10*3/uL — ABNORMAL LOW (ref 140–450)
Poikilocytosis: 0
RBC: 2.82 M/uL — ABNORMAL LOW (ref 3.60–5.20)
RDW-SD: 61.8 — ABNORMAL HIGH (ref 36.4–46.3)
SMUDGE CELLS: 1.9
WBC: 5.8 10*3/uL (ref 4.0–11.0)

## 2018-01-10 LAB — VANCOMYCIN, TROUGH: Vancomycin,trough: 22 ug/mL — CR (ref 15.0–20.0)

## 2018-01-10 LAB — VANCOMYCIN TROUGH: Vancomycin Tr: 22 ug/mL (ref 15.0–20.0)

## 2018-01-13 ENCOUNTER — Inpatient Hospital Stay: Admit: 2018-01-13 | Payer: BLUE CROSS/BLUE SHIELD | Primary: Internal Medicine

## 2018-01-13 DIAGNOSIS — C9100 Acute lymphoblastic leukemia not having achieved remission: Secondary | ICD-10-CM

## 2018-01-13 LAB — CBC WITH AUTOMATED DIFF
Anisocytosis: 0
BASOPHILS: 0.4 % (ref 0–3)
EOSINOPHILS: 1.9 % (ref 0–5)
HCT: 33.9 % — ABNORMAL LOW (ref 37.0–50.0)
HGB: 11.4 gm/dl — ABNORMAL LOW (ref 13.0–17.2)
HYPOCHROMASIA: 0
IMMATURE GRANULOCYTES: 7.1 % — CR (ref 0.0–3.0)
LYMPHOCYTES: 8.6 % — ABNORMAL LOW (ref 28–48)
MCH: 34.1 pg (ref 25.4–34.6)
MCHC: 33.6 gm/dl (ref 30.0–36.0)
MCV: 101.5 fL — ABNORMAL HIGH (ref 80.0–98.0)
MONOCYTES: 0.9 % — ABNORMAL LOW (ref 1–13)
MPV: 9.9 fL (ref 6.0–10.0)
Macrocytes: 0
Microcytes: 0
NEUTROPHILS: 88.6 % — ABNORMAL HIGH (ref 34–64)
NRBC: 0 (ref 0–0)
PLATELET COMMENTS: DECREASED
PLATELET: 102 10*3/uL — ABNORMAL LOW (ref 140–450)
Poikilocytosis: 0
Polychromasia: 2
RBC: 3.34 M/uL — ABNORMAL LOW (ref 3.60–5.20)
RDW-SD: 57.3 — ABNORMAL HIGH (ref 36.4–46.3)
Smudge cells: 1
WBC: 7.8 10*3/uL (ref 4.0–11.0)

## 2018-01-13 LAB — MAGNESIUM
Magnesium: 1.2 mg/dl — ABNORMAL LOW (ref 1.6–2.6)
Magnesium: 1.2 mg/dl — ABNORMAL LOW (ref 1.6–2.6)

## 2018-01-13 LAB — METABOLIC PANEL, COMPREHENSIVE
ALT (SGPT): 38 U/L (ref 12–78)
AST (SGOT): 25 U/L (ref 15–37)
Albumin: 3.2 gm/dl — ABNORMAL LOW (ref 3.4–5.0)
Alk. phosphatase: 86 U/L (ref 45–117)
Anion gap: 11 mmol/L (ref 5–15)
BUN: 14 mg/dl (ref 7–25)
Bilirubin, total: 0.8 mg/dl (ref 0.2–1.0)
CO2: 23 mEq/L (ref 21–32)
Calcium: 8.8 mg/dl (ref 8.5–10.1)
Chloride: 109 mEq/L — ABNORMAL HIGH (ref 98–107)
Creatinine: 0.9 mg/dl (ref 0.6–1.3)
GFR est AA: >60
GFR est non-AA: >60
Glucose: 209 mg/dl — ABNORMAL HIGH (ref 74–106)
Potassium: 3.8 mEq/L (ref 3.5–5.1)
Protein, total: 7 gm/dl (ref 6.4–8.2)
Sodium: 142 mEq/L (ref 136–145)

## 2018-01-13 LAB — VANCOMYCIN, PEAK: Vancomycin,peak: 21 ug/mL (ref 20–40)

## 2018-01-13 LAB — CBC WITH AUTO DIFFERENTIAL
Anisocytosis: 0
Basophils %: 0.4 % (ref 0–3)
Eosinophils %: 1.9 % (ref 0–5)
Hematocrit: 33.9 % — ABNORMAL LOW (ref 37.0–50.0)
Hemoglobin: 11.4 gm/dl — ABNORMAL LOW (ref 13.0–17.2)
Hypochromasia: 0
Immature Granulocytes %: 7.1 % (ref 0.0–3.0)
Lymphocytes %: 8.6 % — ABNORMAL LOW (ref 28–48)
MACROCYTES, MACROCYT: 0
MCH: 34.1 pg (ref 25.4–34.6)
MCHC: 33.6 gm/dl (ref 30.0–36.0)
MCV: 101.5 fL — ABNORMAL HIGH (ref 80.0–98.0)
MICROCYTES: 0
MPV: 9.9 fL (ref 6.0–10.0)
Monocytes %: 0.9 % — ABNORMAL LOW (ref 1–13)
Neutrophils %: 88.6 % — ABNORMAL HIGH (ref 34–64)
Nucleated RBCs: 0 (ref 0–0)
POLYCHROMASIA: 2
Platelet Comment: DECREASED
Platelets: 102 10*3/uL — ABNORMAL LOW (ref 140–450)
Poikilocytosis: 0
RBC: 3.34 M/uL — ABNORMAL LOW (ref 3.60–5.20)
RDW-SD: 57.3 — ABNORMAL HIGH (ref 36.4–46.3)
SMUDGE CELLS: 1
WBC: 7.8 10*3/uL (ref 4.0–11.0)

## 2018-01-13 LAB — COMPREHENSIVE METABOLIC PANEL
ALT: 38 U/L (ref 12–78)
AST: 25 U/L (ref 15–37)
Albumin: 3.2 gm/dl — ABNORMAL LOW (ref 3.4–5.0)
Alkaline Phosphatase: 86 U/L (ref 45–117)
Anion Gap: 11 mmol/L (ref 5–15)
BUN: 14 mg/dl (ref 7–25)
CO2: 23 mEq/L (ref 21–32)
Calcium: 8.8 mg/dl (ref 8.5–10.1)
Chloride: 109 mEq/L — ABNORMAL HIGH (ref 98–107)
Creatinine: 0.9 mg/dl (ref 0.6–1.3)
GFR African American: >60
Glucose: 209 mg/dl — ABNORMAL HIGH (ref 74–106)
Potassium: 3.8 mEq/L (ref 3.5–5.1)
Sodium: 142 mEq/L (ref 136–145)
Total Bilirubin: 0.8 mg/dl (ref 0.2–1.0)
Total Protein: 7 gm/dl (ref 6.4–8.2)
eGFR NON-AA: >60

## 2018-01-14 LAB — CMV BY PCR, QT
CMV IU/mL: <200 IU/mL (ref <200)
CMV log IU/mL: <2.3 log IU/mL (ref <2.30)

## 2018-01-14 LAB — CMV BY PCR QUANTITATIVE
CMV IU/ML: <200 IU/mL (ref <200)
CMV LOG IU/ML: <2.3 log IU/mL (ref <2.30)

## 2018-01-15 ENCOUNTER — Inpatient Hospital Stay: Admit: 2018-01-15 | Payer: BLUE CROSS/BLUE SHIELD | Primary: Internal Medicine

## 2018-01-15 DIAGNOSIS — C9100 Acute lymphoblastic leukemia not having achieved remission: Secondary | ICD-10-CM

## 2018-01-15 LAB — MAGNESIUM
Magnesium: 1.2 mg/dl — ABNORMAL LOW (ref 1.6–2.6)
Magnesium: 1.2 mg/dl — ABNORMAL LOW (ref 1.6–2.6)

## 2018-01-15 LAB — METABOLIC PANEL, COMPREHENSIVE
ALT (SGPT): 47 U/L (ref 12–78)
AST (SGOT): 36 U/L (ref 15–37)
Albumin: 3.1 gm/dl — ABNORMAL LOW (ref 3.4–5.0)
Alk. phosphatase: 91 U/L (ref 45–117)
Anion gap: 5 mmol/L (ref 5–15)
BUN: 11 mg/dl (ref 7–25)
Bilirubin, total: 1.1 mg/dl — ABNORMAL HIGH (ref 0.2–1.0)
CO2: 27 mEq/L (ref 21–32)
Calcium: 9.2 mg/dl (ref 8.5–10.1)
Chloride: 108 mEq/L — ABNORMAL HIGH (ref 98–107)
Creatinine: 0.9 mg/dl (ref 0.6–1.3)
GFR est AA: >60
GFR est non-AA: >60
Glucose: 161 mg/dl — ABNORMAL HIGH (ref 74–106)
Potassium: 3.8 mEq/L (ref 3.5–5.1)
Protein, total: 6.7 gm/dl (ref 6.4–8.2)
Sodium: 141 mEq/L (ref 136–145)

## 2018-01-15 LAB — CBC WITH AUTOMATED DIFF
Anisocytosis: 0
BASOPHILS: 0.4 % (ref 0–3)
EOSINOPHILS: 0.6 % (ref 0–5)
HCT: 32.8 % — ABNORMAL LOW (ref 37.0–50.0)
HGB: 11.1 gm/dl — ABNORMAL LOW (ref 13.0–17.2)
HYPOCHROMASIA: 0
LYMPHOCYTES: 10.8 % — ABNORMAL LOW (ref 28–48)
MCH: 34.8 pg — ABNORMAL HIGH (ref 25.4–34.6)
MCHC: 33.8 gm/dl (ref 30.0–36.0)
MCV: 102.8 fL — ABNORMAL HIGH (ref 80.0–98.0)
METAMYELOCYTES: 1 % — ABNORMAL HIGH (ref 0–0)
MONOCYTES: 5.9 % (ref 1–13)
MPV: 10.7 fL — ABNORMAL HIGH (ref 6.0–10.0)
Macrocytes: 0
Microcytes: 0
NEUTROPHILS: 82.3 % — ABNORMAL HIGH (ref 34–64)
NRBC: 0 (ref 0–0)
PLATELET COMMENTS: DECREASED
PLATELET: 70 10*3/uL — ABNORMAL LOW (ref 140–450)
Poikilocytosis: 0
Polychromasia: 0
RBC: 3.19 M/uL — ABNORMAL LOW (ref 3.60–5.20)
RDW-SD: 57.3 — ABNORMAL HIGH (ref 36.4–46.3)
Smudge cells: 2
WBC: 5.4 10*3/uL (ref 4.0–11.0)

## 2018-01-15 LAB — TACROLIMUS, WHOLE BLOOD
TACROLIMUS,XTACR1: 20.4 mcg/L
Tacrolimus level: 20.4 mcg/L

## 2018-01-15 LAB — CBC WITH AUTO DIFFERENTIAL
Anisocytosis: 0
Basophils %: 0.4 % (ref 0–3)
Eosinophils %: 0.6 % (ref 0–5)
Hematocrit: 32.8 % — ABNORMAL LOW (ref 37.0–50.0)
Hemoglobin: 11.1 gm/dl — ABNORMAL LOW (ref 13.0–17.2)
Hypochromasia: 0
Lymphocytes %: 10.8 % — ABNORMAL LOW (ref 28–48)
MACROCYTES, MACROCYT: 0
MCH: 34.8 pg — ABNORMAL HIGH (ref 25.4–34.6)
MCHC: 33.8 gm/dl (ref 30.0–36.0)
MCV: 102.8 fL — ABNORMAL HIGH (ref 80.0–98.0)
MICROCYTES: 0
MPV: 10.7 fL — ABNORMAL HIGH (ref 6.0–10.0)
Metamyelocytes Relative: 1 % — ABNORMAL HIGH (ref 0–0)
Monocytes %: 5.9 % (ref 1–13)
Neutrophils %: 82.3 % — ABNORMAL HIGH (ref 34–64)
Nucleated RBCs: 0 (ref 0–0)
POLYCHROMASIA: 0
Platelet Comment: DECREASED
Platelets: 70 10*3/uL — ABNORMAL LOW (ref 140–450)
Poikilocytosis: 0
RBC: 3.19 M/uL — ABNORMAL LOW (ref 3.60–5.20)
RDW-SD: 57.3 — ABNORMAL HIGH (ref 36.4–46.3)
SMUDGE CELLS: 2
WBC: 5.4 10*3/uL (ref 4.0–11.0)

## 2018-01-15 LAB — COMPREHENSIVE METABOLIC PANEL
ALT: 47 U/L (ref 12–78)
AST: 36 U/L (ref 15–37)
Albumin: 3.1 gm/dl — ABNORMAL LOW (ref 3.4–5.0)
Alkaline Phosphatase: 91 U/L (ref 45–117)
Anion Gap: 5 mmol/L (ref 5–15)
BUN: 11 mg/dl (ref 7–25)
CO2: 27 mEq/L (ref 21–32)
Calcium: 9.2 mg/dl (ref 8.5–10.1)
Chloride: 108 mEq/L — ABNORMAL HIGH (ref 98–107)
Creatinine: 0.9 mg/dl (ref 0.6–1.3)
GFR African American: >60
Glucose: 161 mg/dl — ABNORMAL HIGH (ref 74–106)
Potassium: 3.8 mEq/L (ref 3.5–5.1)
Sodium: 141 mEq/L (ref 136–145)
Total Bilirubin: 1.1 mg/dl — ABNORMAL HIGH (ref 0.2–1.0)
Total Protein: 6.7 gm/dl (ref 6.4–8.2)
eGFR NON-AA: >60

## 2018-01-19 ENCOUNTER — Inpatient Hospital Stay: Admit: 2018-01-19 | Payer: BLUE CROSS/BLUE SHIELD | Primary: Internal Medicine

## 2018-01-19 DIAGNOSIS — C9100 Acute lymphoblastic leukemia not having achieved remission: Secondary | ICD-10-CM

## 2018-01-19 LAB — METABOLIC PANEL, COMPREHENSIVE
ALT (SGPT): 61 U/L (ref 12–78)
AST (SGOT): 40 U/L — ABNORMAL HIGH (ref 15–37)
Albumin: 3.4 gm/dl (ref 3.4–5.0)
Alk. phosphatase: 106 U/L (ref 45–117)
Anion gap: 7 mmol/L (ref 5–15)
BUN: 9 mg/dl (ref 7–25)
Bilirubin, total: 0.8 mg/dl (ref 0.2–1.0)
CO2: 25 mEq/L (ref 21–32)
Calcium: 9.2 mg/dl (ref 8.5–10.1)
Chloride: 109 mEq/L — ABNORMAL HIGH (ref 98–107)
Creatinine: 1 mg/dl (ref 0.6–1.3)
GFR est AA: >60
GFR est non-AA: >60
Glucose: 189 mg/dl — ABNORMAL HIGH (ref 74–106)
Potassium: 4 mEq/L (ref 3.5–5.1)
Protein, total: 7.1 gm/dl (ref 6.4–8.2)
Sodium: 141 mEq/L (ref 136–145)

## 2018-01-19 LAB — CBC WITH AUTOMATED DIFF
Anisocytosis: 1
BAND NEUTROPHILS: 0.9 % (ref 0–11)
HCT: 33.5 % — ABNORMAL LOW (ref 37.0–50.0)
HGB: 11.4 gm/dl — ABNORMAL LOW (ref 13.0–17.2)
LYMPHOCYTES: 2.9 % — ABNORMAL LOW (ref 28–48)
MCH: 34.1 pg (ref 25.4–34.6)
MCHC: 34 gm/dl (ref 30.0–36.0)
MCV: 100.3 fL — ABNORMAL HIGH (ref 80.0–98.0)
MONOCYTES: 1.9 % (ref 1–13)
MPV: 10.5 fL — ABNORMAL HIGH (ref 6.0–10.0)
NEUTROPHILS: 94.3 % — ABNORMAL HIGH (ref 34–64)
NRBC: 0 (ref 0–0)
PLATELET COMMENTS: DECREASED
PLATELET: 80 10*3/uL — ABNORMAL LOW (ref 140–450)
RBC: 3.34 M/uL — ABNORMAL LOW (ref 3.60–5.20)
RDW-SD: 54.4 — ABNORMAL HIGH (ref 36.4–46.3)
WBC: 6.4 10*3/uL (ref 4.0–11.0)

## 2018-01-19 LAB — MAGNESIUM
Magnesium: 1.3 mg/dl — ABNORMAL LOW (ref 1.6–2.6)
Magnesium: 1.3 mg/dl — ABNORMAL LOW (ref 1.6–2.6)

## 2018-01-19 LAB — COMPREHENSIVE METABOLIC PANEL
ALT: 61 U/L (ref 12–78)
AST: 40 U/L — ABNORMAL HIGH (ref 15–37)
Albumin: 3.4 gm/dl (ref 3.4–5.0)
Alkaline Phosphatase: 106 U/L (ref 45–117)
Anion Gap: 7 mmol/L (ref 5–15)
BUN: 9 mg/dl (ref 7–25)
CO2: 25 mEq/L (ref 21–32)
Calcium: 9.2 mg/dl (ref 8.5–10.1)
Chloride: 109 mEq/L — ABNORMAL HIGH (ref 98–107)
Creatinine: 1 mg/dl (ref 0.6–1.3)
GFR African American: >60
Glucose: 189 mg/dl — ABNORMAL HIGH (ref 74–106)
Potassium: 4 mEq/L (ref 3.5–5.1)
Sodium: 141 mEq/L (ref 136–145)
Total Bilirubin: 0.8 mg/dl (ref 0.2–1.0)
Total Protein: 7.1 gm/dl (ref 6.4–8.2)
eGFR NON-AA: >60

## 2018-01-19 LAB — CBC WITH AUTO DIFFERENTIAL
Anisocytosis: 1
Band Neutrophils: 0.9 % (ref 0–11)
Hematocrit: 33.5 % — ABNORMAL LOW (ref 37.0–50.0)
Hemoglobin: 11.4 gm/dl — ABNORMAL LOW (ref 13.0–17.2)
Lymphocytes %: 2.9 % — ABNORMAL LOW (ref 28–48)
MCH: 34.1 pg (ref 25.4–34.6)
MCHC: 34 gm/dl (ref 30.0–36.0)
MCV: 100.3 fL — ABNORMAL HIGH (ref 80.0–98.0)
MPV: 10.5 fL — ABNORMAL HIGH (ref 6.0–10.0)
Monocytes %: 1.9 % (ref 1–13)
Neutrophils %: 94.3 % — ABNORMAL HIGH (ref 34–64)
Nucleated RBCs: 0 (ref 0–0)
Platelet Comment: DECREASED
Platelets: 80 10*3/uL — ABNORMAL LOW (ref 140–450)
RBC: 3.34 M/uL — ABNORMAL LOW (ref 3.60–5.20)
RDW-SD: 54.4 — ABNORMAL HIGH (ref 36.4–46.3)
WBC: 6.4 10*3/uL (ref 4.0–11.0)

## 2018-01-21 LAB — CMV BY PCR, QT
CMV IU/mL: <200 IU/mL (ref <200)
CMV log IU/mL: <2.3 log IU/mL (ref <2.30)

## 2018-01-21 LAB — TACROLIMUS, WHOLE BLOOD
TACROLIMUS,XTACR1: 11.4 mcg/L
Tacrolimus level: 11.4 mcg/L

## 2018-01-21 LAB — CMV BY PCR QUANTITATIVE
CMV IU/ML: <200 IU/mL (ref <200)
CMV LOG IU/ML: <2.3 log IU/mL (ref <2.30)

## 2018-01-22 ENCOUNTER — Inpatient Hospital Stay: Admit: 2018-01-22 | Payer: BLUE CROSS/BLUE SHIELD | Primary: Internal Medicine

## 2018-01-22 DIAGNOSIS — C9100 Acute lymphoblastic leukemia not having achieved remission: Secondary | ICD-10-CM

## 2018-01-22 LAB — METABOLIC PANEL, COMPREHENSIVE
ALT (SGPT): 51 U/L (ref 12–78)
AST (SGOT): 36 U/L (ref 15–37)
Albumin: 3.2 gm/dl — ABNORMAL LOW (ref 3.4–5.0)
Alk. phosphatase: 93 U/L (ref 45–117)
Anion gap: 7 mmol/L (ref 5–15)
BUN: 14 mg/dl (ref 7–25)
Bilirubin, total: 1 mg/dl (ref 0.2–1.0)
CO2: 25 mEq/L (ref 21–32)
Calcium: 9.1 mg/dl (ref 8.5–10.1)
Chloride: 107 mEq/L (ref 98–107)
Creatinine: 0.9 mg/dl (ref 0.6–1.3)
GFR est AA: >60
GFR est non-AA: >60
Glucose: 182 mg/dl — ABNORMAL HIGH (ref 74–106)
Potassium: 3.8 mEq/L (ref 3.5–5.1)
Protein, total: 6.5 gm/dl (ref 6.4–8.2)
Sodium: 139 mEq/L (ref 136–145)

## 2018-01-22 LAB — CBC WITH AUTOMATED DIFF
BASOPHILS: 0.4 % (ref 0–3)
EOSINOPHILS: 1.1 % (ref 0–5)
HCT: 31.8 % — ABNORMAL LOW (ref 37.0–50.0)
HGB: 10.5 gm/dl — ABNORMAL LOW (ref 13.0–17.2)
LYMPHOCYTES: 11.9 % — ABNORMAL LOW (ref 28–48)
MCH: 34.2 pg (ref 25.4–34.6)
MCHC: 33 gm/dl (ref 30.0–36.0)
MCV: 103.6 fL — ABNORMAL HIGH (ref 80.0–98.0)
MONOCYTES: 8.5 % (ref 1–13)
MPV: 10.9 fL — ABNORMAL HIGH (ref 6.0–10.0)
NEUTROPHILS: 68.8 % — ABNORMAL HIGH (ref 34–64)
NRBC: 0 (ref 0–0)
PLATELET: 90 10*3/uL — ABNORMAL LOW (ref 140–450)
RBC: 3.07 M/uL — ABNORMAL LOW (ref 3.60–5.20)
RDW-SD: 56.6 — ABNORMAL HIGH (ref 36.4–46.3)
WBC: 5.4 10*3/uL (ref 4.0–11.0)

## 2018-01-22 LAB — MAGNESIUM
Magnesium: 1.3 mg/dl — ABNORMAL LOW (ref 1.6–2.6)
Magnesium: 1.3 mg/dl — ABNORMAL LOW (ref 1.6–2.6)

## 2018-01-22 LAB — CBC WITH AUTO DIFFERENTIAL
Basophils %: 0.4 % (ref 0–3)
Eosinophils %: 1.1 % (ref 0–5)
Hematocrit: 31.8 % — ABNORMAL LOW (ref 37.0–50.0)
Hemoglobin: 10.5 gm/dl — ABNORMAL LOW (ref 13.0–17.2)
Lymphocytes %: 11.9 % — ABNORMAL LOW (ref 28–48)
MCH: 34.2 pg (ref 25.4–34.6)
MCHC: 33 gm/dl (ref 30.0–36.0)
MCV: 103.6 fL — ABNORMAL HIGH (ref 80.0–98.0)
MPV: 10.9 fL — ABNORMAL HIGH (ref 6.0–10.0)
Monocytes %: 8.5 % (ref 1–13)
Neutrophils %: 68.8 % — ABNORMAL HIGH (ref 34–64)
Nucleated RBCs: 0 (ref 0–0)
Platelets: 90 10*3/uL — ABNORMAL LOW (ref 140–450)
RBC: 3.07 M/uL — ABNORMAL LOW (ref 3.60–5.20)
RDW-SD: 56.6 — ABNORMAL HIGH (ref 36.4–46.3)
WBC: 5.4 10*3/uL (ref 4.0–11.0)

## 2018-01-22 LAB — COMPREHENSIVE METABOLIC PANEL
ALT: 51 U/L (ref 12–78)
AST: 36 U/L (ref 15–37)
Albumin: 3.2 gm/dl — ABNORMAL LOW (ref 3.4–5.0)
Alkaline Phosphatase: 93 U/L (ref 45–117)
Anion Gap: 7 mmol/L (ref 5–15)
BUN: 14 mg/dl (ref 7–25)
CO2: 25 mEq/L (ref 21–32)
Calcium: 9.1 mg/dl (ref 8.5–10.1)
Chloride: 107 mEq/L (ref 98–107)
Creatinine: 0.9 mg/dl (ref 0.6–1.3)
GFR African American: >60
Glucose: 182 mg/dl — ABNORMAL HIGH (ref 74–106)
Potassium: 3.8 mEq/L (ref 3.5–5.1)
Sodium: 139 mEq/L (ref 136–145)
Total Bilirubin: 1 mg/dl (ref 0.2–1.0)
Total Protein: 6.5 gm/dl (ref 6.4–8.2)
eGFR NON-AA: >60

## 2018-01-26 ENCOUNTER — Inpatient Hospital Stay: Admit: 2018-01-26 | Payer: BLUE CROSS/BLUE SHIELD | Primary: Internal Medicine

## 2018-01-26 DIAGNOSIS — C9100 Acute lymphoblastic leukemia not having achieved remission: Secondary | ICD-10-CM

## 2018-01-26 LAB — METABOLIC PANEL, COMPREHENSIVE
ALT (SGPT): 66 U/L (ref 12–78)
AST (SGOT): 41 U/L — ABNORMAL HIGH (ref 15–37)
Albumin: 3 gm/dl — ABNORMAL LOW (ref 3.4–5.0)
Alk. phosphatase: 97 U/L (ref 45–117)
Anion gap: 7 mmol/L (ref 5–15)
BUN: 12 mg/dl (ref 7–25)
Bilirubin, total: 0.6 mg/dl (ref 0.2–1.0)
CO2: 26 mEq/L (ref 21–32)
Calcium: 8.4 mg/dl — ABNORMAL LOW (ref 8.5–10.1)
Chloride: 109 mEq/L — ABNORMAL HIGH (ref 98–107)
Creatinine: 0.8 mg/dl (ref 0.6–1.3)
GFR est AA: >60
GFR est non-AA: >60
Glucose: 152 mg/dl — ABNORMAL HIGH (ref 74–106)
Potassium: 3.4 mEq/L — ABNORMAL LOW (ref 3.5–5.1)
Protein, total: 6.3 gm/dl — ABNORMAL LOW (ref 6.4–8.2)
Sodium: 141 mEq/L (ref 136–145)

## 2018-01-26 LAB — CBC WITH AUTOMATED DIFF
ATYPICAL LYMPHS: 3.8 % — ABNORMAL HIGH (ref 0–0)
Anisocytosis: 0
BAND NEUTROPHILS: 3.8 % (ref 0–11)
BASOPHILS: 0.4 % (ref 0–3)
EOSINOPHILS: 0.9 % (ref 0–5)
HCT: 31.1 % — ABNORMAL LOW (ref 37.0–50.0)
HGB: 10.6 gm/dl — ABNORMAL LOW (ref 13.0–17.2)
HYPOCHROMASIA: 0
LYMPHOCYTES: 8.6 % — ABNORMAL LOW (ref 28–48)
MCH: 34.5 pg (ref 25.4–34.6)
MCHC: 34.1 gm/dl (ref 30.0–36.0)
MCV: 101.3 fL — ABNORMAL HIGH (ref 80.0–98.0)
METAMYELOCYTES: 1.9 % — ABNORMAL HIGH (ref 0–0)
MONOCYTES: 4.8 % (ref 1–13)
MPV: 10.4 fL — ABNORMAL HIGH (ref 6.0–10.0)
MYELOCYTES: 0.9 % — ABNORMAL HIGH (ref 0–0)
Macrocytes: 0
Microcytes: 0
NEUTROPHILS: 74.3 % — ABNORMAL HIGH (ref 34–64)
NRBC: 0 (ref 0–0)
PLATELET: 97 10*3/uL — ABNORMAL LOW (ref 140–450)
Poikilocytosis: 0
Polychromasia: 0
RBC: 3.07 M/uL — ABNORMAL LOW (ref 3.60–5.20)
RDW-SD: 54.8 — ABNORMAL HIGH (ref 36.4–46.3)
Smudge cells: 1.9
UNIDENTIFIED MONONUCLEAR CELL: 1 % — ABNORMAL HIGH (ref 0–0)
WBC: 5.2 10*3/uL (ref 4.0–11.0)

## 2018-01-26 LAB — MAGNESIUM
Magnesium: 1.2 mg/dl — ABNORMAL LOW (ref 1.6–2.6)
Magnesium: 1.2 mg/dl — ABNORMAL LOW (ref 1.6–2.6)

## 2018-01-26 LAB — CBC WITH AUTO DIFFERENTIAL
Anisocytosis: 0
Atypical Lymphocytes: 3.8 % — ABNORMAL HIGH (ref 0–0)
Band Neutrophils: 3.8 % (ref 0–11)
Basophils %: 0.4 % (ref 0–3)
Eosinophils %: 0.9 % (ref 0–5)
Hematocrit: 31.1 % — ABNORMAL LOW (ref 37.0–50.0)
Hemoglobin: 10.6 gm/dl — ABNORMAL LOW (ref 13.0–17.2)
Hypochromasia: 0
Lymphocytes %: 8.6 % — ABNORMAL LOW (ref 28–48)
MACROCYTES, MACROCYT: 0
MCH: 34.5 pg (ref 25.4–34.6)
MCHC: 34.1 gm/dl (ref 30.0–36.0)
MCV: 101.3 fL — ABNORMAL HIGH (ref 80.0–98.0)
MICROCYTES: 0
MPV: 10.4 fL — ABNORMAL HIGH (ref 6.0–10.0)
Metamyelocytes Relative: 1.9 % — ABNORMAL HIGH (ref 0–0)
Monocytes %: 4.8 % (ref 1–13)
Myelocyte Percent: 0.9 % — ABNORMAL HIGH (ref 0–0)
Neutrophils %: 74.3 % — ABNORMAL HIGH (ref 34–64)
Nucleated RBCs: 0 (ref 0–0)
POLYCHROMASIA: 0
Platelets: 97 10*3/uL — ABNORMAL LOW (ref 140–450)
Poikilocytosis: 0
RBC: 3.07 M/uL — ABNORMAL LOW (ref 3.60–5.20)
RDW-SD: 54.8 — ABNORMAL HIGH (ref 36.4–46.3)
SMUDGE CELLS: 1.9
UNIDENTIFIED MONONUCLEAR CELL, UNIDMONO: 1 % — ABNORMAL HIGH (ref 0–0)
WBC: 5.2 10*3/uL (ref 4.0–11.0)

## 2018-01-26 LAB — COMPREHENSIVE METABOLIC PANEL
ALT: 66 U/L (ref 12–78)
AST: 41 U/L — ABNORMAL HIGH (ref 15–37)
Albumin: 3 gm/dl — ABNORMAL LOW (ref 3.4–5.0)
Alkaline Phosphatase: 97 U/L (ref 45–117)
Anion Gap: 7 mmol/L (ref 5–15)
BUN: 12 mg/dl (ref 7–25)
CO2: 26 mEq/L (ref 21–32)
Calcium: 8.4 mg/dl — ABNORMAL LOW (ref 8.5–10.1)
Chloride: 109 mEq/L — ABNORMAL HIGH (ref 98–107)
Creatinine: 0.8 mg/dl (ref 0.6–1.3)
GFR African American: >60
Glucose: 152 mg/dl — ABNORMAL HIGH (ref 74–106)
Potassium: 3.4 mEq/L — ABNORMAL LOW (ref 3.5–5.1)
Sodium: 141 mEq/L (ref 136–145)
Total Bilirubin: 0.6 mg/dl (ref 0.2–1.0)
Total Protein: 6.3 gm/dl — ABNORMAL LOW (ref 6.4–8.2)
eGFR NON-AA: >60

## 2018-01-27 LAB — TACROLIMUS, WHOLE BLOOD
TACROLIMUS,XTACR1: 8.2 mcg/L
Tacrolimus level: 8.2 mcg/L

## 2018-01-28 LAB — CMV BY PCR, QT
CMV IU/mL: <200 IU/mL (ref <200)
CMV log IU/mL: <2.3 log IU/mL (ref <2.30)

## 2018-01-28 LAB — CMV BY PCR QUANTITATIVE
CMV IU/ML: <200 IU/mL (ref <200)
CMV LOG IU/ML: <2.3 log IU/mL (ref <2.30)

## 2018-02-03 ENCOUNTER — Inpatient Hospital Stay: Admit: 2018-02-03 | Payer: BLUE CROSS/BLUE SHIELD | Primary: Internal Medicine

## 2018-02-03 DIAGNOSIS — C9100 Acute lymphoblastic leukemia not having achieved remission: Secondary | ICD-10-CM

## 2018-02-03 LAB — METABOLIC PANEL, COMPREHENSIVE
ALT (SGPT): 58 U/L (ref 12–78)
AST (SGOT): 56 U/L — ABNORMAL HIGH (ref 15–37)
Albumin: 3.2 gm/dl — ABNORMAL LOW (ref 3.4–5.0)
Alk. phosphatase: 106 U/L (ref 45–117)
Anion gap: 6 mmol/L (ref 5–15)
BUN: 11 mg/dl (ref 7–25)
Bilirubin, total: 0.6 mg/dl (ref 0.2–1.0)
CO2: 27 mEq/L (ref 21–32)
Calcium: 9.2 mg/dl (ref 8.5–10.1)
Chloride: 108 mEq/L — ABNORMAL HIGH (ref 98–107)
Creatinine: 0.8 mg/dl (ref 0.6–1.3)
GFR est AA: >60
GFR est non-AA: >60
Glucose: 167 mg/dl — ABNORMAL HIGH (ref 74–106)
Potassium: 4.4 mEq/L (ref 3.5–5.1)
Protein, total: 6.6 gm/dl (ref 6.4–8.2)
Sodium: 141 mEq/L (ref 136–145)

## 2018-02-03 LAB — MAGNESIUM
Magnesium: 1.6 mg/dl (ref 1.6–2.6)
Magnesium: 1.6 mg/dl (ref 1.6–2.6)

## 2018-02-03 LAB — COMPREHENSIVE METABOLIC PANEL
ALT: 58 U/L (ref 12–78)
AST: 56 U/L — ABNORMAL HIGH (ref 15–37)
Albumin: 3.2 gm/dl — ABNORMAL LOW (ref 3.4–5.0)
Alkaline Phosphatase: 106 U/L (ref 45–117)
Anion Gap: 6 mmol/L (ref 5–15)
BUN: 11 mg/dl (ref 7–25)
CO2: 27 mEq/L (ref 21–32)
Calcium: 9.2 mg/dl (ref 8.5–10.1)
Chloride: 108 mEq/L — ABNORMAL HIGH (ref 98–107)
Creatinine: 0.8 mg/dl (ref 0.6–1.3)
EGFR IF NonAfrican American: >60
GFR African American: >60
Glucose: 167 mg/dl — ABNORMAL HIGH (ref 74–106)
Potassium: 4.4 mEq/L (ref 3.5–5.1)
Sodium: 141 mEq/L (ref 136–145)
Total Bilirubin: 0.6 mg/dl (ref 0.2–1.0)
Total Protein: 6.6 gm/dl (ref 6.4–8.2)

## 2018-02-04 LAB — CBC WITH AUTOMATED DIFF
Anisocytosis: 1
BAND NEUTROPHILS: 1.9 % (ref 0–11)
BASOPHILS: 0.3 % (ref 0–3)
EOSINOPHILS: 0.8 % (ref 0–5)
HCT: 57.7 % — ABNORMAL HIGH (ref 37.0–50.0)
HGB: 19.5 gm/dl — ABNORMAL HIGH (ref 13.0–17.2)
LYMPHOCYTES: 9.6 % — ABNORMAL LOW (ref 28–48)
MCH: 34.9 pg — ABNORMAL HIGH (ref 25.4–34.6)
MCHC: 33.8 gm/dl (ref 30.0–36.0)
MCV: 103.2 fL — ABNORMAL HIGH (ref 80.0–98.0)
MONOCYTES: 2.9 % (ref 1–13)
MPV: 11.5 fL — ABNORMAL HIGH (ref 6.0–10.0)
Macrocytes: 1
Microcytes: 1
NEUTROPHILS: 85.6 % — ABNORMAL HIGH (ref 34–64)
NRBC: 1
PLATELET COMMENTS: DECREASED
PLATELET: 60 10*3/uL — ABNORMAL LOW (ref 140–450)
RBC: 5.59 M/uL — ABNORMAL HIGH (ref 3.60–5.20)
RDW-SD: 55.9 — ABNORMAL HIGH (ref 36.4–46.3)
Smudge cells: 5.8
WBC: 3.8 10*3/uL — ABNORMAL LOW (ref 4.0–11.0)

## 2018-02-04 LAB — CBC WITH AUTO DIFFERENTIAL
Anisocytosis: 1
Band Neutrophils: 1.9 % (ref 0–11)
Basophils %: 0.3 % (ref 0–3)
Eosinophils %: 0.8 % (ref 0–5)
Hematocrit: 57.7 % — ABNORMAL HIGH (ref 37.0–50.0)
Hemoglobin: 19.5 gm/dl — ABNORMAL HIGH (ref 13.0–17.2)
Lymphocytes %: 9.6 % — ABNORMAL LOW (ref 28–48)
MACROCYTES, MACROCYT: 1
MCH: 34.9 pg — ABNORMAL HIGH (ref 25.4–34.6)
MCHC: 33.8 gm/dl (ref 30.0–36.0)
MCV: 103.2 fL — ABNORMAL HIGH (ref 80.0–98.0)
MICROCYTES: 1
MPV: 11.5 fL — ABNORMAL HIGH (ref 6.0–10.0)
Monocytes %: 2.9 % (ref 1–13)
Neutrophils %: 85.6 % — ABNORMAL HIGH (ref 34–64)
Nucleated RBCs: 1
Platelet Comment: DECREASED
Platelets: 60 10*3/uL — ABNORMAL LOW (ref 140–450)
RBC: 5.59 M/uL — ABNORMAL HIGH (ref 3.60–5.20)
RDW-SD: 55.9 — ABNORMAL HIGH (ref 36.4–46.3)
SMUDGE CELLS: 5.8
WBC: 3.8 10*3/uL — ABNORMAL LOW (ref 4.0–11.0)

## 2018-02-05 ENCOUNTER — Inpatient Hospital Stay: Admit: 2018-02-05 | Payer: BLUE CROSS/BLUE SHIELD | Primary: Internal Medicine

## 2018-02-05 DIAGNOSIS — C9100 Acute lymphoblastic leukemia not having achieved remission: Secondary | ICD-10-CM

## 2018-02-05 LAB — CBC WITH AUTOMATED DIFF
BAND NEUTROPHILS: 1 % (ref 0–11)
EOSINOPHILS: 0.9 % (ref 0–5)
HCT: 33 % — ABNORMAL LOW (ref 37.0–50.0)
HGB: 11 gm/dl — ABNORMAL LOW (ref 13.0–17.2)
LYMPHOCYTES: 6.7 % — ABNORMAL LOW (ref 28–48)
MCH: 34.9 pg — ABNORMAL HIGH (ref 25.4–34.6)
MCHC: 33.3 gm/dl (ref 30.0–36.0)
MCV: 104.8 fL — ABNORMAL HIGH (ref 80.0–98.0)
METAMYELOCYTES: 1 % — ABNORMAL HIGH (ref 0–0)
MONOCYTES: 5.8 % (ref 1–13)
MPV: 10.3 fL — ABNORMAL HIGH (ref 6.0–10.0)
MYELOCYTES: 1.9 % — ABNORMAL HIGH (ref 0–0)
NEUTROPHILS: 82.7 % — ABNORMAL HIGH (ref 34–64)
NRBC: 0 (ref 0–0)
PLATELET COMMENTS: DECREASED
PLATELET: 133 10*3/uL — ABNORMAL LOW (ref 140–450)
RBC Morphology: NORMAL
RBC: 3.15 M/uL — ABNORMAL LOW (ref 3.60–5.20)
RDW-SD: 56.1 — ABNORMAL HIGH (ref 36.4–46.3)
WBC: 6.5 10*3/uL (ref 4.0–11.0)

## 2018-02-05 LAB — METABOLIC PANEL, COMPREHENSIVE
ALT (SGPT): 48 U/L (ref 12–78)
AST (SGOT): 33 U/L (ref 15–37)
Albumin: 3.2 gm/dl — ABNORMAL LOW (ref 3.4–5.0)
Alk. phosphatase: 101 U/L (ref 45–117)
Anion gap: 6 mmol/L (ref 5–15)
BUN: 11 mg/dl (ref 7–25)
Bilirubin, total: 0.5 mg/dl (ref 0.2–1.0)
CO2: 28 mEq/L (ref 21–32)
Calcium: 9.3 mg/dl (ref 8.5–10.1)
Chloride: 108 mEq/L — ABNORMAL HIGH (ref 98–107)
Creatinine: 0.8 mg/dl (ref 0.6–1.3)
GFR est AA: >60
GFR est non-AA: >60
Glucose: 125 mg/dl — ABNORMAL HIGH (ref 74–106)
Potassium: 4.1 mEq/L (ref 3.5–5.1)
Protein, total: 6.4 gm/dl (ref 6.4–8.2)
Sodium: 141 mEq/L (ref 136–145)

## 2018-02-05 LAB — CMV BY PCR, QT
CMV IU/mL: <200 IU/mL (ref <200)
CMV log IU/mL: <2.3 log IU/mL (ref <2.30)

## 2018-02-05 LAB — MAGNESIUM
Magnesium: 1.7 mg/dl (ref 1.6–2.6)
Magnesium: 1.7 mg/dl (ref 1.6–2.6)

## 2018-02-05 LAB — COMPREHENSIVE METABOLIC PANEL
ALT: 48 U/L (ref 12–78)
AST: 33 U/L (ref 15–37)
Albumin: 3.2 gm/dl — ABNORMAL LOW (ref 3.4–5.0)
Alkaline Phosphatase: 101 U/L (ref 45–117)
Anion Gap: 6 mmol/L (ref 5–15)
BUN: 11 mg/dl (ref 7–25)
CO2: 28 mEq/L (ref 21–32)
Calcium: 9.3 mg/dl (ref 8.5–10.1)
Chloride: 108 mEq/L — ABNORMAL HIGH (ref 98–107)
Creatinine: 0.8 mg/dl (ref 0.6–1.3)
EGFR IF NonAfrican American: >60
GFR African American: >60
Glucose: 125 mg/dl — ABNORMAL HIGH (ref 74–106)
Potassium: 4.1 mEq/L (ref 3.5–5.1)
Sodium: 141 mEq/L (ref 136–145)
Total Bilirubin: 0.5 mg/dl (ref 0.2–1.0)
Total Protein: 6.4 gm/dl (ref 6.4–8.2)

## 2018-02-05 LAB — CBC WITH AUTO DIFFERENTIAL
Band Neutrophils: 1 % (ref 0–11)
Eosinophils %: 0.9 % (ref 0–5)
Hematocrit: 33 % — ABNORMAL LOW (ref 37.0–50.0)
Hemoglobin: 11 gm/dl — ABNORMAL LOW (ref 13.0–17.2)
Lymphocytes %: 6.7 % — ABNORMAL LOW (ref 28–48)
MCH: 34.9 pg — ABNORMAL HIGH (ref 25.4–34.6)
MCHC: 33.3 gm/dl (ref 30.0–36.0)
MCV: 104.8 fL — ABNORMAL HIGH (ref 80.0–98.0)
MPV: 10.3 fL — ABNORMAL HIGH (ref 6.0–10.0)
Metamyelocytes Relative: 1 % — ABNORMAL HIGH (ref 0–0)
Monocytes %: 5.8 % (ref 1–13)
Myelocyte Percent: 1.9 % — ABNORMAL HIGH (ref 0–0)
Neutrophils %: 82.7 % — ABNORMAL HIGH (ref 34–64)
Nucleated RBCs: 0 (ref 0–0)
Platelet Comment: DECREASED
Platelets: 133 10*3/uL — ABNORMAL LOW (ref 140–450)
RBC MORPHOLOGY: NORMAL
RBC: 3.15 M/uL — ABNORMAL LOW (ref 3.60–5.20)
RDW-SD: 56.1 — ABNORMAL HIGH (ref 36.4–46.3)
WBC: 6.5 10*3/uL (ref 4.0–11.0)

## 2018-02-05 LAB — CMV BY PCR QUANTITATIVE
CMV IU/ML: <200 IU/mL (ref <200)
CMV LOG IU/ML: <2.3 log IU/mL (ref <2.30)

## 2018-02-06 LAB — TACROLIMUS, WHOLE BLOOD
TACROLIMUS,XTACR1: 7.9 mcg/L
Tacrolimus level: 7.9 mcg/L

## 2018-02-09 ENCOUNTER — Inpatient Hospital Stay: Admit: 2018-02-09 | Payer: BLUE CROSS/BLUE SHIELD | Primary: Internal Medicine

## 2018-02-09 DIAGNOSIS — C9100 Acute lymphoblastic leukemia not having achieved remission: Secondary | ICD-10-CM

## 2018-02-09 LAB — METABOLIC PANEL, COMPREHENSIVE
ALT (SGPT): 54 U/L (ref 12–78)
AST (SGOT): 35 U/L (ref 15–37)
Albumin: 3.1 gm/dl — ABNORMAL LOW (ref 3.4–5.0)
Alk. phosphatase: 102 U/L (ref 45–117)
Anion gap: 7 mmol/L (ref 5–15)
BUN: 10 mg/dl (ref 7–25)
Bilirubin, total: 1 mg/dl (ref 0.2–1.0)
CO2: 26 mEq/L (ref 21–32)
Calcium: 9.2 mg/dl (ref 8.5–10.1)
Chloride: 107 mEq/L (ref 98–107)
Creatinine: 0.8 mg/dl (ref 0.6–1.3)
GFR est AA: >60
GFR est non-AA: >60
Glucose: 105 mg/dl (ref 74–106)
Potassium: 3.9 mEq/L (ref 3.5–5.1)
Protein, total: 6.4 gm/dl (ref 6.4–8.2)
Sodium: 141 mEq/L (ref 136–145)

## 2018-02-09 LAB — CBC WITH AUTOMATED DIFF
Anisocytosis: 0
BASOPHILS: 0.5 % (ref 0–3)
EOSINOPHILS: 0.9 % (ref 0–5)
Giant platelets: 1.9
HCT: 33.1 % — ABNORMAL LOW (ref 37.0–50.0)
HGB: 11 gm/dl — ABNORMAL LOW (ref 13.0–17.2)
HYPOCHROMASIA: 0
LYMPHOCYTES: 10.5 % — ABNORMAL LOW (ref 28–48)
MCH: 34.8 pg — ABNORMAL HIGH (ref 25.4–34.6)
MCHC: 33.2 gm/dl (ref 30.0–36.0)
MCV: 104.7 fL — ABNORMAL HIGH (ref 80.0–98.0)
MONOCYTES: 2.9 % (ref 1–13)
MPV: 9.8 fL (ref 6.0–10.0)
MYELOCYTES: 0.9 % — ABNORMAL HIGH (ref 0–0)
Macrocytes: 0
Microcytes: 0
NEUTROPHILS: 82.9 % — ABNORMAL HIGH (ref 34–64)
NRBC: 0 (ref 0–0)
PLATELET COMMENTS: DECREASED
PLATELET: 124 10*3/uL — ABNORMAL LOW (ref 140–450)
Poikilocytosis: 0
Polychromasia: 0
RBC: 3.16 M/uL — ABNORMAL LOW (ref 3.60–5.20)
RDW-SD: 55 — ABNORMAL HIGH (ref 36.4–46.3)
UNIDENTIFIED MONONUCLEAR CELL: 1.9 % — ABNORMAL HIGH (ref 0–0)
WBC: 6.1 10*3/uL (ref 4.0–11.0)

## 2018-02-09 LAB — MAGNESIUM
Magnesium: 1.5 mg/dl — ABNORMAL LOW (ref 1.6–2.6)
Magnesium: 1.5 mg/dl — ABNORMAL LOW (ref 1.6–2.6)

## 2018-02-09 LAB — CBC WITH AUTO DIFFERENTIAL
Anisocytosis: 0
Basophils %: 0.5 % (ref 0–3)
Eosinophils %: 0.9 % (ref 0–5)
GIANT PLATELETS: 1.9
Hematocrit: 33.1 % — ABNORMAL LOW (ref 37.0–50.0)
Hemoglobin: 11 gm/dl — ABNORMAL LOW (ref 13.0–17.2)
Hypochromasia: 0
Lymphocytes %: 10.5 % — ABNORMAL LOW (ref 28–48)
MACROCYTES, MACROCYT: 0
MCH: 34.8 pg — ABNORMAL HIGH (ref 25.4–34.6)
MCHC: 33.2 gm/dl (ref 30.0–36.0)
MCV: 104.7 fL — ABNORMAL HIGH (ref 80.0–98.0)
MICROCYTES: 0
MPV: 9.8 fL (ref 6.0–10.0)
Monocytes %: 2.9 % (ref 1–13)
Myelocyte Percent: 0.9 % — ABNORMAL HIGH (ref 0–0)
Neutrophils %: 82.9 % — ABNORMAL HIGH (ref 34–64)
Nucleated RBCs: 0 (ref 0–0)
POLYCHROMASIA: 0
Platelet Comment: DECREASED
Platelets: 124 10*3/uL — ABNORMAL LOW (ref 140–450)
Poikilocytosis: 0
RBC: 3.16 M/uL — ABNORMAL LOW (ref 3.60–5.20)
RDW-SD: 55 — ABNORMAL HIGH (ref 36.4–46.3)
UNIDENTIFIED MONONUCLEAR CELL, UNIDMONO: 1.9 % — ABNORMAL HIGH (ref 0–0)
WBC: 6.1 10*3/uL (ref 4.0–11.0)

## 2018-02-09 LAB — COMPREHENSIVE METABOLIC PANEL
ALT: 54 U/L (ref 12–78)
AST: 35 U/L (ref 15–37)
Albumin: 3.1 gm/dl — ABNORMAL LOW (ref 3.4–5.0)
Alkaline Phosphatase: 102 U/L (ref 45–117)
Anion Gap: 7 mmol/L (ref 5–15)
BUN: 10 mg/dl (ref 7–25)
CO2: 26 mEq/L (ref 21–32)
Calcium: 9.2 mg/dl (ref 8.5–10.1)
Chloride: 107 mEq/L (ref 98–107)
Creatinine: 0.8 mg/dl (ref 0.6–1.3)
GFR African American: >60
Glucose: 105 mg/dl (ref 74–106)
Potassium: 3.9 mEq/L (ref 3.5–5.1)
Sodium: 141 mEq/L (ref 136–145)
Total Bilirubin: 1 mg/dl (ref 0.2–1.0)
Total Protein: 6.4 gm/dl (ref 6.4–8.2)
eGFR NON-AA: >60

## 2018-02-10 LAB — CMV BY PCR, QT
CMV IU/mL: <200 IU/mL (ref <200)
CMV log IU/mL: <2.3 log IU/mL (ref <2.30)

## 2018-02-10 LAB — CMV BY PCR QUANTITATIVE
CMV IU/ML: <200 IU/mL (ref <200)
CMV LOG IU/ML: <2.3 log IU/mL (ref <2.30)

## 2018-02-11 LAB — TACROLIMUS, WHOLE BLOOD
TACROLIMUS,XTACR1: 4.4 mcg/L
Tacrolimus level: 4.4 mcg/L

## 2018-02-12 ENCOUNTER — Inpatient Hospital Stay: Admit: 2018-02-12 | Payer: BLUE CROSS/BLUE SHIELD | Primary: Internal Medicine

## 2018-02-12 DIAGNOSIS — C9100 Acute lymphoblastic leukemia not having achieved remission: Secondary | ICD-10-CM

## 2018-02-12 LAB — CBC WITH AUTOMATED DIFF
EOSINOPHILS: 1.9 % (ref 0–5)
HCT: 34.5 % — ABNORMAL LOW (ref 37.0–50.0)
HGB: 11.5 gm/dl — ABNORMAL LOW (ref 13.0–17.2)
IMMATURE GRANULOCYTES: 6.9 % — CR (ref 0.0–3.0)
LYMPHOCYTES: 8.6 % — ABNORMAL LOW (ref 28–48)
MCH: 35.4 pg — ABNORMAL HIGH (ref 25.4–34.6)
MCHC: 33.3 gm/dl (ref 30.0–36.0)
MCV: 106.2 fL — ABNORMAL HIGH (ref 80.0–98.0)
METAMYELOCYTES: 1 % — ABNORMAL HIGH (ref 0–0)
MONOCYTES: 5.8 % (ref 1–13)
MPV: 11 fL — ABNORMAL HIGH (ref 6.0–10.0)
MYELOCYTES: 1 % — ABNORMAL HIGH (ref 0–0)
NEUTROPHILS: 77.9 % — ABNORMAL HIGH (ref 34–64)
NRBC: 0 (ref 0–0)
PLATELET COMMENTS: DECREASED
PLATELET: 119 10*3/uL — ABNORMAL LOW (ref 140–450)
RBC: 3.25 M/uL — ABNORMAL LOW (ref 3.60–5.20)
RDW-SD: 56.3 — ABNORMAL HIGH (ref 36.4–46.3)
WBC: 5.7 10*3/uL (ref 4.0–11.0)

## 2018-02-12 LAB — METABOLIC PANEL, COMPREHENSIVE
ALT (SGPT): 46 U/L (ref 12–78)
AST (SGOT): 32 U/L (ref 15–37)
Albumin: 3.2 gm/dl — ABNORMAL LOW (ref 3.4–5.0)
Alk. phosphatase: 105 U/L (ref 45–117)
Anion gap: 7 mmol/L (ref 5–15)
BUN: 11 mg/dl (ref 7–25)
Bilirubin, total: 0.6 mg/dl (ref 0.2–1.0)
CO2: 27 mEq/L (ref 21–32)
Calcium: 9.1 mg/dl (ref 8.5–10.1)
Chloride: 105 mEq/L (ref 98–107)
Creatinine: 0.8 mg/dl (ref 0.6–1.3)
GFR est AA: >60
GFR est non-AA: >60
Glucose: 172 mg/dl — ABNORMAL HIGH (ref 74–106)
Potassium: 4.3 mEq/L (ref 3.5–5.1)
Protein, total: 6.4 gm/dl (ref 6.4–8.2)
Sodium: 140 mEq/L (ref 136–145)

## 2018-02-12 LAB — MAGNESIUM
Magnesium: 1.6 mg/dl (ref 1.6–2.6)
Magnesium: 1.6 mg/dl (ref 1.6–2.6)

## 2018-02-12 LAB — CBC WITH AUTO DIFFERENTIAL
Eosinophils %: 1.9 % (ref 0–5)
Hematocrit: 34.5 % — ABNORMAL LOW (ref 37.0–50.0)
Hemoglobin: 11.5 gm/dl — ABNORMAL LOW (ref 13.0–17.2)
Immature Granulocytes %: 6.9 % (ref 0.0–3.0)
Lymphocytes %: 8.6 % — ABNORMAL LOW (ref 28–48)
MCH: 35.4 pg — ABNORMAL HIGH (ref 25.4–34.6)
MCHC: 33.3 gm/dl (ref 30.0–36.0)
MCV: 106.2 fL — ABNORMAL HIGH (ref 80.0–98.0)
MPV: 11 fL — ABNORMAL HIGH (ref 6.0–10.0)
Metamyelocytes Relative: 1 % — ABNORMAL HIGH (ref 0–0)
Monocytes %: 5.8 % (ref 1–13)
Myelocyte Percent: 1 % — ABNORMAL HIGH (ref 0–0)
Neutrophils %: 77.9 % — ABNORMAL HIGH (ref 34–64)
Nucleated RBCs: 0 (ref 0–0)
Platelet Comment: DECREASED
Platelets: 119 10*3/uL — ABNORMAL LOW (ref 140–450)
RBC: 3.25 M/uL — ABNORMAL LOW (ref 3.60–5.20)
RDW-SD: 56.3 — ABNORMAL HIGH (ref 36.4–46.3)
WBC: 5.7 10*3/uL (ref 4.0–11.0)

## 2018-02-12 LAB — COMPREHENSIVE METABOLIC PANEL
ALT: 46 U/L (ref 12–78)
AST: 32 U/L (ref 15–37)
Albumin: 3.2 gm/dl — ABNORMAL LOW (ref 3.4–5.0)
Alkaline Phosphatase: 105 U/L (ref 45–117)
Anion Gap: 7 mmol/L (ref 5–15)
BUN: 11 mg/dl (ref 7–25)
CO2: 27 mEq/L (ref 21–32)
Calcium: 9.1 mg/dl (ref 8.5–10.1)
Chloride: 105 mEq/L (ref 98–107)
Creatinine: 0.8 mg/dl (ref 0.6–1.3)
GFR African American: >60
Glucose: 172 mg/dl — ABNORMAL HIGH (ref 74–106)
Potassium: 4.3 mEq/L (ref 3.5–5.1)
Sodium: 140 mEq/L (ref 136–145)
Total Bilirubin: 0.6 mg/dl (ref 0.2–1.0)
Total Protein: 6.4 gm/dl (ref 6.4–8.2)
eGFR NON-AA: >60

## 2018-02-17 ENCOUNTER — Inpatient Hospital Stay: Admit: 2018-02-17 | Payer: BLUE CROSS/BLUE SHIELD | Primary: Internal Medicine

## 2018-02-17 DIAGNOSIS — C9100 Acute lymphoblastic leukemia not having achieved remission: Secondary | ICD-10-CM

## 2018-02-17 LAB — METABOLIC PANEL, COMPREHENSIVE
ALT (SGPT): 35 U/L (ref 12–78)
AST (SGOT): 22 U/L (ref 15–37)
Albumin: 3.5 gm/dl (ref 3.4–5.0)
Alk. phosphatase: 100 U/L (ref 45–117)
Anion gap: 9 mmol/L (ref 5–15)
BUN: 13 mg/dl (ref 7–25)
Bilirubin, total: 0.7 mg/dl (ref 0.2–1.0)
CO2: 24 mEq/L (ref 21–32)
Calcium: 9.2 mg/dl (ref 8.5–10.1)
Chloride: 105 mEq/L (ref 98–107)
Creatinine: 0.9 mg/dl (ref 0.6–1.3)
GFR est AA: >60
GFR est non-AA: >60
Glucose: 245 mg/dl — ABNORMAL HIGH (ref 74–106)
Potassium: 3.9 mEq/L (ref 3.5–5.1)
Protein, total: 6.8 gm/dl (ref 6.4–8.2)
Sodium: 137 mEq/L (ref 136–145)

## 2018-02-17 LAB — MAGNESIUM
Magnesium: 1.4 mg/dl — ABNORMAL LOW (ref 1.6–2.6)
Magnesium: 1.4 mg/dl — ABNORMAL LOW (ref 1.6–2.6)

## 2018-02-17 LAB — COMPREHENSIVE METABOLIC PANEL
ALT: 35 U/L (ref 12–78)
AST: 22 U/L (ref 15–37)
Albumin: 3.5 gm/dl (ref 3.4–5.0)
Alkaline Phosphatase: 100 U/L (ref 45–117)
Anion Gap: 9 mmol/L (ref 5–15)
BUN: 13 mg/dl (ref 7–25)
CO2: 24 mEq/L (ref 21–32)
Calcium: 9.2 mg/dl (ref 8.5–10.1)
Chloride: 105 mEq/L (ref 98–107)
Creatinine: 0.9 mg/dl (ref 0.6–1.3)
GFR African American: >60
Glucose: 245 mg/dl — ABNORMAL HIGH (ref 74–106)
Potassium: 3.9 mEq/L (ref 3.5–5.1)
Sodium: 137 mEq/L (ref 136–145)
Total Bilirubin: 0.7 mg/dl (ref 0.2–1.0)
Total Protein: 6.8 gm/dl (ref 6.4–8.2)
eGFR NON-AA: >60

## 2018-02-18 LAB — CBC WITH AUTOMATED DIFF
Anisocytosis: 1
BAND NEUTROPHILS: 2.8 % (ref 0–11)
BASOPHILS: 0.5 % (ref 0–3)
EOSINOPHILS: 1 % (ref 0–5)
HCT: 36.3 % — ABNORMAL LOW (ref 37.0–50.0)
HGB: 12.4 gm/dl — ABNORMAL LOW (ref 13.0–17.2)
IMMATURE GRANULOCYTES: 6.4 % — CR (ref 0.0–3.0)
LYMPHOCYTES: 10.4 % — ABNORMAL LOW (ref 28–48)
MCH: 34.9 pg — ABNORMAL HIGH (ref 25.4–34.6)
MCHC: 34.2 gm/dl (ref 30.0–36.0)
MCV: 102.3 fL — ABNORMAL HIGH (ref 80.0–98.0)
METAMYELOCYTES: 0.9 % — ABNORMAL HIGH (ref 0–0)
MONOCYTES: 4.7 % (ref 1–13)
MPV: 9.9 fL (ref 6.0–10.0)
MYELOCYTES: 0.9 % — ABNORMAL HIGH (ref 0–0)
Macrocytes: 1
Microcytes: 1
NEUTROPHILS: 78.3 % — ABNORMAL HIGH (ref 34–64)
NRBC: 0 (ref 0–0)
PLATELET COMMENTS: NORMAL
PLATELET: 155 10*3/uL (ref 140–450)
PROMYELOCYTES: 1
RBC: 3.55 M/uL — ABNORMAL LOW (ref 3.60–5.20)
RDW-SD: 52.7 — ABNORMAL HIGH (ref 36.4–46.3)
Smudge cells: 12.3
WBC: 6.3 10*3/uL (ref 4.0–11.0)

## 2018-02-18 LAB — CMV BY PCR, QT
CMV IU/mL: <200 IU/mL (ref <200)
CMV log IU/mL: <2.3 log IU/mL (ref <2.30)

## 2018-02-18 LAB — TACROLIMUS, WHOLE BLOOD
TACROLIMUS,XTACR1: 4.9 mcg/L
Tacrolimus level: 4.9 mcg/L

## 2018-02-18 LAB — CBC WITH AUTO DIFFERENTIAL
Anisocytosis: 1
Band Neutrophils: 2.8 % (ref 0–11)
Basophils %: 0.5 % (ref 0–3)
Eosinophils %: 1 % (ref 0–5)
Hematocrit: 36.3 % — ABNORMAL LOW (ref 37.0–50.0)
Hemoglobin: 12.4 gm/dl — ABNORMAL LOW (ref 13.0–17.2)
Immature Granulocytes %: 6.4 % (ref 0.0–3.0)
Lymphocytes %: 10.4 % — ABNORMAL LOW (ref 28–48)
MACROCYTES, MACROCYT: 1
MCH: 34.9 pg — ABNORMAL HIGH (ref 25.4–34.6)
MCHC: 34.2 gm/dl (ref 30.0–36.0)
MCV: 102.3 fL — ABNORMAL HIGH (ref 80.0–98.0)
MICROCYTES: 1
MPV: 9.9 fL (ref 6.0–10.0)
Metamyelocytes Relative: 0.9 % — ABNORMAL HIGH (ref 0–0)
Monocytes %: 4.7 % (ref 1–13)
Myelocyte Percent: 0.9 % — ABNORMAL HIGH (ref 0–0)
Neutrophils %: 78.3 % — ABNORMAL HIGH (ref 34–64)
Nucleated RBCs: 0 (ref 0–0)
Platelet Comment: NORMAL
Platelets: 155 10*3/uL (ref 140–450)
Promyelocytes: 1
RBC: 3.55 M/uL — ABNORMAL LOW (ref 3.60–5.20)
RDW-SD: 52.7 — ABNORMAL HIGH (ref 36.4–46.3)
SMUDGE CELLS: 12.3
WBC: 6.3 10*3/uL (ref 4.0–11.0)

## 2018-02-18 LAB — CMV BY PCR QUANTITATIVE
CMV IU/ML: <200 IU/mL (ref <200)
CMV LOG IU/ML: <2.3 log IU/mL (ref <2.30)

## 2018-02-19 ENCOUNTER — Inpatient Hospital Stay: Admit: 2018-02-19 | Payer: BLUE CROSS/BLUE SHIELD | Primary: Internal Medicine

## 2018-02-19 DIAGNOSIS — C9100 Acute lymphoblastic leukemia not having achieved remission: Secondary | ICD-10-CM

## 2018-02-19 LAB — CBC WITH AUTOMATED DIFF
BAND NEUTROPHILS: 2.9 % (ref 0–11)
BASOPHILS: 0.9 % (ref 0–3)
EOSINOPHILS: 1.9 % (ref 0–5)
HCT: 36 % — ABNORMAL LOW (ref 37.0–50.0)
HGB: 12 gm/dl — ABNORMAL LOW (ref 13.0–17.2)
IMMATURE GRANULOCYTES: 5.2 % — CR (ref 0.0–3.0)
LYMPHOCYTES: 9.5 % — ABNORMAL LOW (ref 28–48)
MCH: 35.3 pg — ABNORMAL HIGH (ref 25.4–34.6)
MCHC: 33.3 gm/dl (ref 30.0–36.0)
MCV: 105.9 fL — ABNORMAL HIGH (ref 80.0–98.0)
METAMYELOCYTES: 1 % — ABNORMAL HIGH (ref 0–0)
MONOCYTES: 4.8 % (ref 1–13)
MPV: 9.7 fL (ref 6.0–10.0)
NEUTROPHILS: 79 % — ABNORMAL HIGH (ref 34–64)
NRBC: 0 (ref 0–0)
PLATELET COMMENTS: NORMAL
PLATELET: 152 10*3/uL (ref 140–450)
RBC Morphology: NORMAL
RBC: 3.4 M/uL — ABNORMAL LOW (ref 3.60–5.20)
RDW-SD: 55.8 — ABNORMAL HIGH (ref 36.4–46.3)
Smudge cells: 17.1
WBC: 6.1 10*3/uL (ref 4.0–11.0)

## 2018-02-19 LAB — METABOLIC PANEL, COMPREHENSIVE
ALT (SGPT): 40 U/L (ref 12–78)
AST (SGOT): 26 U/L (ref 15–37)
Albumin: 3.3 gm/dl — ABNORMAL LOW (ref 3.4–5.0)
Alk. phosphatase: 99 U/L (ref 45–117)
Anion gap: 6 mmol/L (ref 5–15)
BUN: 8 mg/dl (ref 7–25)
Bilirubin, total: 0.6 mg/dl (ref 0.2–1.0)
CO2: 25 mEq/L (ref 21–32)
Calcium: 8.9 mg/dl (ref 8.5–10.1)
Chloride: 107 mEq/L (ref 98–107)
Creatinine: 0.8 mg/dl (ref 0.6–1.3)
GFR est AA: >60
GFR est non-AA: >60
Glucose: 212 mg/dl — ABNORMAL HIGH (ref 74–106)
Potassium: 4.5 mEq/L (ref 3.5–5.1)
Protein, total: 6.3 gm/dl — ABNORMAL LOW (ref 6.4–8.2)
Sodium: 139 mEq/L (ref 136–145)

## 2018-02-19 LAB — MAGNESIUM
Magnesium: 2 mg/dl (ref 1.6–2.6)
Magnesium: 2 mg/dl (ref 1.6–2.6)

## 2018-02-19 LAB — COMPREHENSIVE METABOLIC PANEL
ALT: 40 U/L (ref 12–78)
AST: 26 U/L (ref 15–37)
Albumin: 3.3 gm/dl — ABNORMAL LOW (ref 3.4–5.0)
Alkaline Phosphatase: 99 U/L (ref 45–117)
Anion Gap: 6 mmol/L (ref 5–15)
BUN: 8 mg/dl (ref 7–25)
CO2: 25 mEq/L (ref 21–32)
Calcium: 8.9 mg/dl (ref 8.5–10.1)
Chloride: 107 mEq/L (ref 98–107)
Creatinine: 0.8 mg/dl (ref 0.6–1.3)
GFR African American: >60
Glucose: 212 mg/dl — ABNORMAL HIGH (ref 74–106)
Potassium: 4.5 mEq/L (ref 3.5–5.1)
Sodium: 139 mEq/L (ref 136–145)
Total Bilirubin: 0.6 mg/dl (ref 0.2–1.0)
Total Protein: 6.3 gm/dl — ABNORMAL LOW (ref 6.4–8.2)
eGFR NON-AA: >60

## 2018-02-19 LAB — CBC WITH AUTO DIFFERENTIAL
Band Neutrophils: 2.9 % (ref 0–11)
Basophils %: 0.9 % (ref 0–3)
Eosinophils %: 1.9 % (ref 0–5)
Hematocrit: 36 % — ABNORMAL LOW (ref 37.0–50.0)
Hemoglobin: 12 gm/dl — ABNORMAL LOW (ref 13.0–17.2)
Immature Granulocytes %: 5.2 % (ref 0.0–3.0)
Lymphocytes %: 9.5 % — ABNORMAL LOW (ref 28–48)
MCH: 35.3 pg — ABNORMAL HIGH (ref 25.4–34.6)
MCHC: 33.3 gm/dl (ref 30.0–36.0)
MCV: 105.9 fL — ABNORMAL HIGH (ref 80.0–98.0)
MPV: 9.7 fL (ref 6.0–10.0)
Metamyelocytes Relative: 1 % — ABNORMAL HIGH (ref 0–0)
Monocytes %: 4.8 % (ref 1–13)
Neutrophils %: 79 % — ABNORMAL HIGH (ref 34–64)
Nucleated RBCs: 0 (ref 0–0)
Platelet Comment: NORMAL
Platelets: 152 10*3/uL (ref 140–450)
RBC MORPHOLOGY: NORMAL
RBC: 3.4 M/uL — ABNORMAL LOW (ref 3.60–5.20)
RDW-SD: 55.8 — ABNORMAL HIGH (ref 36.4–46.3)
SMUDGE CELLS: 17.1
WBC: 6.1 10*3/uL (ref 4.0–11.0)

## 2018-02-23 ENCOUNTER — Inpatient Hospital Stay: Admit: 2018-02-23 | Payer: BLUE CROSS/BLUE SHIELD | Primary: Internal Medicine

## 2018-02-23 DIAGNOSIS — C9100 Acute lymphoblastic leukemia not having achieved remission: Secondary | ICD-10-CM

## 2018-02-23 LAB — METABOLIC PANEL, COMPREHENSIVE
ALT (SGPT): 34 U/L (ref 12–78)
AST (SGOT): 22 U/L (ref 15–37)
Albumin: 3.4 gm/dl (ref 3.4–5.0)
Alk. phosphatase: 101 U/L (ref 45–117)
Anion gap: 9 mmol/L (ref 5–15)
BUN: 13 mg/dl (ref 7–25)
Bilirubin, total: 0.5 mg/dl (ref 0.2–1.0)
CO2: 26 mEq/L (ref 21–32)
Calcium: 9.2 mg/dl (ref 8.5–10.1)
Chloride: 105 mEq/L (ref 98–107)
Creatinine: 0.8 mg/dl (ref 0.6–1.3)
GFR est AA: >60
GFR est non-AA: >60
Glucose: 140 mg/dl — ABNORMAL HIGH (ref 74–106)
Potassium: 4 mEq/L (ref 3.5–5.1)
Protein, total: 6.4 gm/dl (ref 6.4–8.2)
Sodium: 140 mEq/L (ref 136–145)

## 2018-02-23 LAB — CBC WITH AUTOMATED DIFF
BAND NEUTROPHILS: 1.9 % (ref 0–11)
BASOPHILS: 0.9 % (ref 0–3)
EOSINOPHILS: 1.9 % (ref 0–5)
HCT: 36.6 % — ABNORMAL LOW (ref 37.0–50.0)
HGB: 12.6 gm/dl — ABNORMAL LOW (ref 13.0–17.2)
IMMATURE GRANULOCYTES: 7.8 % — CR (ref 0.0–3.0)
LYMPHOCYTES: 6.7 % — ABNORMAL LOW (ref 28–48)
MCH: 35.5 pg — ABNORMAL HIGH (ref 25.4–34.6)
MCHC: 34.4 gm/dl (ref 30.0–36.0)
MCV: 103.1 fL — ABNORMAL HIGH (ref 80.0–98.0)
METAMYELOCYTES: 1 % — ABNORMAL HIGH (ref 0–0)
MONOCYTES: 4.8 % (ref 1–13)
MPV: 9.8 fL (ref 6.0–10.0)
MYELOCYTES: 1 % — ABNORMAL HIGH (ref 0–0)
NEUTROPHILS: 80.9 % — ABNORMAL HIGH (ref 34–64)
NRBC: 0 (ref 0–0)
PLATELET COMMENTS: NORMAL
PLATELET: 159 10*3/uL (ref 140–450)
PROMYELOCYTES: 1
RBC Morphology: NORMAL
RBC: 3.55 M/uL — ABNORMAL LOW (ref 3.60–5.20)
RDW-SD: 51.3 — ABNORMAL HIGH (ref 36.4–46.3)
Smudge cells: 4.8
WBC: 6 10*3/uL (ref 4.0–11.0)

## 2018-02-23 LAB — MAGNESIUM
Magnesium: 1.3 mg/dl — ABNORMAL LOW (ref 1.6–2.6)
Magnesium: 1.3 mg/dl — ABNORMAL LOW (ref 1.6–2.6)

## 2018-02-23 LAB — CBC WITH AUTO DIFFERENTIAL
Band Neutrophils: 1.9 % (ref 0–11)
Basophils %: 0.9 % (ref 0–3)
Eosinophils %: 1.9 % (ref 0–5)
Hematocrit: 36.6 % — ABNORMAL LOW (ref 37.0–50.0)
Hemoglobin: 12.6 gm/dl — ABNORMAL LOW (ref 13.0–17.2)
Immature Granulocytes %: 7.8 % (ref 0.0–3.0)
Lymphocytes %: 6.7 % — ABNORMAL LOW (ref 28–48)
MCH: 35.5 pg — ABNORMAL HIGH (ref 25.4–34.6)
MCHC: 34.4 gm/dl (ref 30.0–36.0)
MCV: 103.1 fL — ABNORMAL HIGH (ref 80.0–98.0)
MPV: 9.8 fL (ref 6.0–10.0)
Metamyelocytes Relative: 1 % — ABNORMAL HIGH (ref 0–0)
Monocytes %: 4.8 % (ref 1–13)
Myelocyte Percent: 1 % — ABNORMAL HIGH (ref 0–0)
Neutrophils %: 80.9 % — ABNORMAL HIGH (ref 34–64)
Nucleated RBCs: 0 (ref 0–0)
Platelet Comment: NORMAL
Platelets: 159 10*3/uL (ref 140–450)
Promyelocytes: 1
RBC MORPHOLOGY: NORMAL
RBC: 3.55 M/uL — ABNORMAL LOW (ref 3.60–5.20)
RDW-SD: 51.3 — ABNORMAL HIGH (ref 36.4–46.3)
SMUDGE CELLS: 4.8
WBC: 6 10*3/uL (ref 4.0–11.0)

## 2018-02-23 LAB — COMPREHENSIVE METABOLIC PANEL
ALT: 34 U/L (ref 12–78)
AST: 22 U/L (ref 15–37)
Albumin: 3.4 gm/dl (ref 3.4–5.0)
Alkaline Phosphatase: 101 U/L (ref 45–117)
Anion Gap: 9 mmol/L (ref 5–15)
BUN: 13 mg/dl (ref 7–25)
CO2: 26 mEq/L (ref 21–32)
Calcium: 9.2 mg/dl (ref 8.5–10.1)
Chloride: 105 mEq/L (ref 98–107)
Creatinine: 0.8 mg/dl (ref 0.6–1.3)
GFR African American: >60
Glucose: 140 mg/dl — ABNORMAL HIGH (ref 74–106)
Potassium: 4 mEq/L (ref 3.5–5.1)
Sodium: 140 mEq/L (ref 136–145)
Total Bilirubin: 0.5 mg/dl (ref 0.2–1.0)
Total Protein: 6.4 gm/dl (ref 6.4–8.2)
eGFR NON-AA: >60

## 2018-02-25 LAB — CMV BY PCR, QT
CMV IU/mL: <200 IU/mL (ref <200)
CMV log IU/mL: <2.3 log IU/mL (ref <2.30)

## 2018-02-25 LAB — TACROLIMUS, WHOLE BLOOD
TACROLIMUS,XTACR1: 5.4 mcg/L
Tacrolimus level: 5.4 mcg/L

## 2018-02-25 LAB — CMV BY PCR QUANTITATIVE
CMV IU/ML: <200 IU/mL (ref <200)
CMV LOG IU/ML: <2.3 log IU/mL (ref <2.30)

## 2018-03-02 ENCOUNTER — Inpatient Hospital Stay: Admit: 2018-03-02 | Payer: BLUE CROSS/BLUE SHIELD | Primary: Internal Medicine

## 2018-03-02 DIAGNOSIS — C9100 Acute lymphoblastic leukemia not having achieved remission: Secondary | ICD-10-CM

## 2018-03-02 LAB — CBC WITH AUTOMATED DIFF
BASOPHILS: 0.4 % (ref 0–3)
EOSINOPHILS: 1.1 % (ref 0–5)
HCT: 33.5 % — ABNORMAL LOW (ref 37.0–50.0)
HGB: 11.4 gm/dl — ABNORMAL LOW (ref 13.0–17.2)
IMMATURE GRANULOCYTES: 3.7 % — ABNORMAL HIGH (ref 0.0–3.0)
LYMPHOCYTES: 11.9 % — ABNORMAL LOW (ref 28–48)
MCH: 35.2 pg — ABNORMAL HIGH (ref 25.4–34.6)
MCHC: 34 gm/dl (ref 30.0–36.0)
MCV: 103.4 fL — ABNORMAL HIGH (ref 80.0–98.0)
MONOCYTES: 7.4 % (ref 1–13)
MPV: 9.6 fL (ref 6.0–10.0)
NEUTROPHILS: 75.5 % — ABNORMAL HIGH (ref 34–64)
NRBC: 0 (ref 0–0)
PLATELET: 141 10*3/uL (ref 140–450)
RBC: 3.24 M/uL — ABNORMAL LOW (ref 3.60–5.20)
RDW-SD: 51.6 — ABNORMAL HIGH (ref 36.4–46.3)
WBC: 5.4 10*3/uL (ref 4.0–11.0)

## 2018-03-02 LAB — METABOLIC PANEL, COMPREHENSIVE
ALT (SGPT): 39 U/L (ref 12–78)
AST (SGOT): 27 U/L (ref 15–37)
Albumin: 3.2 gm/dl — ABNORMAL LOW (ref 3.4–5.0)
Alk. phosphatase: 89 U/L (ref 45–117)
Anion gap: 6 mmol/L (ref 5–15)
BUN: 11 mg/dl (ref 7–25)
Bilirubin, total: 0.6 mg/dl (ref 0.2–1.0)
CO2: 27 mEq/L (ref 21–32)
Calcium: 8.9 mg/dl (ref 8.5–10.1)
Chloride: 108 mEq/L — ABNORMAL HIGH (ref 98–107)
Creatinine: 0.7 mg/dl (ref 0.6–1.3)
GFR est AA: >60
GFR est non-AA: >60
Glucose: 140 mg/dl — ABNORMAL HIGH (ref 74–106)
Potassium: 3.9 mEq/L (ref 3.5–5.1)
Protein, total: 6.1 gm/dl — ABNORMAL LOW (ref 6.4–8.2)
Sodium: 141 mEq/L (ref 136–145)

## 2018-03-02 LAB — MAGNESIUM
Magnesium: 1.3 mg/dl — ABNORMAL LOW (ref 1.6–2.6)
Magnesium: 1.3 mg/dl — ABNORMAL LOW (ref 1.6–2.6)

## 2018-03-02 LAB — CBC WITH AUTO DIFFERENTIAL
Basophils %: 0.4 % (ref 0–3)
Eosinophils %: 1.1 % (ref 0–5)
Hematocrit: 33.5 % — ABNORMAL LOW (ref 37.0–50.0)
Hemoglobin: 11.4 gm/dl — ABNORMAL LOW (ref 13.0–17.2)
Immature Granulocytes %: 3.7 % — ABNORMAL HIGH (ref 0.0–3.0)
Lymphocytes %: 11.9 % — ABNORMAL LOW (ref 28–48)
MCH: 35.2 pg — ABNORMAL HIGH (ref 25.4–34.6)
MCHC: 34 gm/dl (ref 30.0–36.0)
MCV: 103.4 fL — ABNORMAL HIGH (ref 80.0–98.0)
MPV: 9.6 fL (ref 6.0–10.0)
Monocytes %: 7.4 % (ref 1–13)
Neutrophils %: 75.5 % — ABNORMAL HIGH (ref 34–64)
Nucleated RBCs: 0 (ref 0–0)
Platelets: 141 10*3/uL (ref 140–450)
RBC: 3.24 M/uL — ABNORMAL LOW (ref 3.60–5.20)
RDW-SD: 51.6 — ABNORMAL HIGH (ref 36.4–46.3)
WBC: 5.4 10*3/uL (ref 4.0–11.0)

## 2018-03-02 LAB — COMPREHENSIVE METABOLIC PANEL
ALT: 39 U/L (ref 12–78)
AST: 27 U/L (ref 15–37)
Albumin: 3.2 gm/dl — ABNORMAL LOW (ref 3.4–5.0)
Alkaline Phosphatase: 89 U/L (ref 45–117)
Anion Gap: 6 mmol/L (ref 5–15)
BUN: 11 mg/dl (ref 7–25)
CO2: 27 mEq/L (ref 21–32)
Calcium: 8.9 mg/dl (ref 8.5–10.1)
Chloride: 108 mEq/L — ABNORMAL HIGH (ref 98–107)
Creatinine: 0.7 mg/dl (ref 0.6–1.3)
GFR African American: >60
Glucose: 140 mg/dl — ABNORMAL HIGH (ref 74–106)
Potassium: 3.9 mEq/L (ref 3.5–5.1)
Sodium: 141 mEq/L (ref 136–145)
Total Bilirubin: 0.6 mg/dl (ref 0.2–1.0)
Total Protein: 6.1 gm/dl — ABNORMAL LOW (ref 6.4–8.2)
eGFR NON-AA: >60

## 2018-03-03 LAB — CMV BY PCR, QT
CMV IU/mL: <200 IU/mL (ref <200)
CMV log IU/mL: <2.3 log IU/mL (ref <2.30)

## 2018-03-03 LAB — TACROLIMUS, WHOLE BLOOD
TACROLIMUS,XTACR1: 6 mcg/L
Tacrolimus level: 6 mcg/L

## 2018-03-03 LAB — CMV BY PCR QUANTITATIVE
CMV IU/ML: <200 IU/mL (ref <200)
CMV LOG IU/ML: <2.3 log IU/mL (ref <2.30)

## 2018-03-05 ENCOUNTER — Inpatient Hospital Stay: Admit: 2018-03-05 | Payer: BLUE CROSS/BLUE SHIELD | Primary: Internal Medicine

## 2018-03-05 DIAGNOSIS — C9100 Acute lymphoblastic leukemia not having achieved remission: Secondary | ICD-10-CM

## 2018-03-05 LAB — METABOLIC PANEL, COMPREHENSIVE
ALT (SGPT): 34 U/L (ref 12–78)
AST (SGOT): 20 U/L (ref 15–37)
Albumin: 3.4 gm/dl (ref 3.4–5.0)
Alk. phosphatase: 90 U/L (ref 45–117)
Anion gap: 8 mmol/L (ref 5–15)
BUN: 9 mg/dl (ref 7–25)
Bilirubin, total: 0.6 mg/dl (ref 0.2–1.0)
CO2: 26 mEq/L (ref 21–32)
Calcium: 9.3 mg/dl (ref 8.5–10.1)
Chloride: 109 mEq/L — ABNORMAL HIGH (ref 98–107)
Creatinine: 0.9 mg/dl (ref 0.6–1.3)
GFR est AA: >60
GFR est non-AA: >60
Glucose: 106 mg/dl (ref 74–106)
Potassium: 3.6 mEq/L (ref 3.5–5.1)
Protein, total: 6.3 gm/dl — ABNORMAL LOW (ref 6.4–8.2)
Sodium: 143 mEq/L (ref 136–145)

## 2018-03-05 LAB — MAGNESIUM
Magnesium: 1.4 mg/dl — ABNORMAL LOW (ref 1.6–2.6)
Magnesium: 1.4 mg/dl — ABNORMAL LOW (ref 1.6–2.6)

## 2018-03-05 LAB — CBC WITH AUTOMATED DIFF
BASOPHILS: 0.6 % (ref 0–3)
EOSINOPHILS: 0.7 % (ref 0–5)
HCT: 34.7 % — ABNORMAL LOW (ref 37.0–50.0)
HGB: 11.8 gm/dl — ABNORMAL LOW (ref 13.0–17.2)
IMMATURE GRANULOCYTES: 4 % — ABNORMAL HIGH (ref 0.0–3.0)
LYMPHOCYTES: 8.4 % — ABNORMAL LOW (ref 28–48)
MCH: 35.3 pg — ABNORMAL HIGH (ref 25.4–34.6)
MCHC: 34 gm/dl (ref 30.0–36.0)
MCV: 103.9 fL — ABNORMAL HIGH (ref 80.0–98.0)
MONOCYTES: 10 % (ref 1–13)
MPV: 9.6 fL (ref 6.0–10.0)
NEUTROPHILS: 76.3 % — ABNORMAL HIGH (ref 34–64)
NRBC: 0 (ref 0–0)
PLATELET: 145 10*3/uL (ref 140–450)
RBC: 3.34 M/uL — ABNORMAL LOW (ref 3.60–5.20)
RDW-SD: 51.9 — ABNORMAL HIGH (ref 36.4–46.3)
WBC: 6.8 10*3/uL (ref 4.0–11.0)

## 2018-03-05 LAB — COMPREHENSIVE METABOLIC PANEL
ALT: 34 U/L (ref 12–78)
AST: 20 U/L (ref 15–37)
Albumin: 3.4 gm/dl (ref 3.4–5.0)
Alkaline Phosphatase: 90 U/L (ref 45–117)
Anion Gap: 8 mmol/L (ref 5–15)
BUN: 9 mg/dl (ref 7–25)
CO2: 26 mEq/L (ref 21–32)
Calcium: 9.3 mg/dl (ref 8.5–10.1)
Chloride: 109 mEq/L — ABNORMAL HIGH (ref 98–107)
Creatinine: 0.9 mg/dl (ref 0.6–1.3)
EGFR IF NonAfrican American: >60
GFR African American: >60
Glucose: 106 mg/dl (ref 74–106)
Potassium: 3.6 mEq/L (ref 3.5–5.1)
Sodium: 143 mEq/L (ref 136–145)
Total Bilirubin: 0.6 mg/dl (ref 0.2–1.0)
Total Protein: 6.3 gm/dl — ABNORMAL LOW (ref 6.4–8.2)

## 2018-03-05 LAB — CBC WITH AUTO DIFFERENTIAL
Basophils %: 0.6 % (ref 0–3)
Eosinophils %: 0.7 % (ref 0–5)
Hematocrit: 34.7 % — ABNORMAL LOW (ref 37.0–50.0)
Hemoglobin: 11.8 gm/dl — ABNORMAL LOW (ref 13.0–17.2)
Immature Granulocytes: 4 % — ABNORMAL HIGH (ref 0.0–3.0)
Lymphocytes %: 8.4 % — ABNORMAL LOW (ref 28–48)
MCH: 35.3 pg — ABNORMAL HIGH (ref 25.4–34.6)
MCHC: 34 gm/dl (ref 30.0–36.0)
MCV: 103.9 fL — ABNORMAL HIGH (ref 80.0–98.0)
MPV: 9.6 fL (ref 6.0–10.0)
Monocytes %: 10 % (ref 1–13)
Neutrophils %: 76.3 % — ABNORMAL HIGH (ref 34–64)
Nucleated RBCs: 0 (ref 0–0)
Platelets: 145 10*3/uL (ref 140–450)
RBC: 3.34 M/uL — ABNORMAL LOW (ref 3.60–5.20)
RDW-SD: 51.9 — ABNORMAL HIGH (ref 36.4–46.3)
WBC: 6.8 10*3/uL (ref 4.0–11.0)

## 2018-03-09 ENCOUNTER — Inpatient Hospital Stay: Admit: 2018-03-09 | Payer: BLUE CROSS/BLUE SHIELD | Primary: Internal Medicine

## 2018-03-09 DIAGNOSIS — C9100 Acute lymphoblastic leukemia not having achieved remission: Secondary | ICD-10-CM

## 2018-03-09 LAB — CBC WITH AUTOMATED DIFF
BAND NEUTROPHILS: 4.8 % (ref 0–11)
BASOPHILS: 0.6 % (ref 0–3)
EOSINOPHILS: 1.1 % (ref 0–5)
HCT: 36 % — ABNORMAL LOW (ref 37.0–50.0)
HGB: 12.2 gm/dl — ABNORMAL LOW (ref 13.0–17.2)
IMMATURE GRANULOCYTES: 3 % (ref 0.0–3.0)
LYMPHOCYTES: 12.6 % — ABNORMAL LOW (ref 28–48)
MCH: 35 pg — ABNORMAL HIGH (ref 25.4–34.6)
MCHC: 33.9 gm/dl (ref 30.0–36.0)
MCV: 103.2 fL — ABNORMAL HIGH (ref 80.0–98.0)
METAMYELOCYTES: 1 % — ABNORMAL HIGH (ref 0–0)
MONOCYTES: 2.9 % (ref 1–13)
MPV: 9.3 fL (ref 6.0–10.0)
NEUTROPHILS: 77.7 % — ABNORMAL HIGH (ref 34–64)
NRBC: 1
PLATELET COMMENTS: NORMAL
PLATELET: 163 10*3/uL (ref 140–450)
PROMYELOCYTES: 1
RBC Morphology: NORMAL
RBC: 3.49 M/uL — ABNORMAL LOW (ref 3.60–5.20)
RDW-SD: 51.6 — ABNORMAL HIGH (ref 36.4–46.3)
WBC: 6.2 10*3/uL (ref 4.0–11.0)

## 2018-03-09 LAB — METABOLIC PANEL, COMPREHENSIVE
ALT (SGPT): 32 U/L (ref 12–78)
AST (SGOT): 25 U/L (ref 15–37)
Albumin: 3.3 gm/dl — ABNORMAL LOW (ref 3.4–5.0)
Alk. phosphatase: 91 U/L (ref 45–117)
Anion gap: 8 mmol/L (ref 5–15)
BUN: 7 mg/dl (ref 7–25)
Bilirubin, total: 0.7 mg/dl (ref 0.2–1.0)
CO2: 26 mEq/L (ref 21–32)
Calcium: 9.4 mg/dl (ref 8.5–10.1)
Chloride: 108 mEq/L — ABNORMAL HIGH (ref 98–107)
Creatinine: 0.7 mg/dl (ref 0.6–1.3)
GFR est AA: >60
GFR est non-AA: >60
Glucose: 95 mg/dl (ref 74–106)
Potassium: 3.8 mEq/L (ref 3.5–5.1)
Protein, total: 6.4 gm/dl (ref 6.4–8.2)
Sodium: 142 mEq/L (ref 136–145)

## 2018-03-09 LAB — MAGNESIUM
Magnesium: 1.4 mg/dl — ABNORMAL LOW (ref 1.6–2.6)
Magnesium: 1.4 mg/dl — ABNORMAL LOW (ref 1.6–2.6)

## 2018-03-09 LAB — CBC WITH AUTO DIFFERENTIAL
Band Neutrophils: 4.8 % (ref 0–11)
Basophils %: 0.6 % (ref 0–3)
Eosinophils %: 1.1 % (ref 0–5)
Hematocrit: 36 % — ABNORMAL LOW (ref 37.0–50.0)
Hemoglobin: 12.2 gm/dl — ABNORMAL LOW (ref 13.0–17.2)
Immature Granulocytes %: 3 % (ref 0.0–3.0)
Lymphocytes %: 12.6 % — ABNORMAL LOW (ref 28–48)
MCH: 35 pg — ABNORMAL HIGH (ref 25.4–34.6)
MCHC: 33.9 gm/dl (ref 30.0–36.0)
MCV: 103.2 fL — ABNORMAL HIGH (ref 80.0–98.0)
MPV: 9.3 fL (ref 6.0–10.0)
Metamyelocytes Relative: 1 % — ABNORMAL HIGH (ref 0–0)
Monocytes %: 2.9 % (ref 1–13)
Neutrophils %: 77.7 % — ABNORMAL HIGH (ref 34–64)
Nucleated RBCs: 1
Platelet Comment: NORMAL
Platelets: 163 10*3/uL (ref 140–450)
Promyelocytes: 1
RBC MORPHOLOGY: NORMAL
RBC: 3.49 M/uL — ABNORMAL LOW (ref 3.60–5.20)
RDW-SD: 51.6 — ABNORMAL HIGH (ref 36.4–46.3)
WBC: 6.2 10*3/uL (ref 4.0–11.0)

## 2018-03-09 LAB — COMPREHENSIVE METABOLIC PANEL
ALT: 32 U/L (ref 12–78)
AST: 25 U/L (ref 15–37)
Albumin: 3.3 gm/dl — ABNORMAL LOW (ref 3.4–5.0)
Alkaline Phosphatase: 91 U/L (ref 45–117)
Anion Gap: 8 mmol/L (ref 5–15)
BUN: 7 mg/dl (ref 7–25)
CO2: 26 mEq/L (ref 21–32)
Calcium: 9.4 mg/dl (ref 8.5–10.1)
Chloride: 108 mEq/L — ABNORMAL HIGH (ref 98–107)
Creatinine: 0.7 mg/dl (ref 0.6–1.3)
GFR African American: >60
Glucose: 95 mg/dl (ref 74–106)
Potassium: 3.8 mEq/L (ref 3.5–5.1)
Sodium: 142 mEq/L (ref 136–145)
Total Bilirubin: 0.7 mg/dl (ref 0.2–1.0)
Total Protein: 6.4 gm/dl (ref 6.4–8.2)
eGFR NON-AA: >60

## 2018-03-10 LAB — TACROLIMUS, WHOLE BLOOD
TACROLIMUS,XTACR1: 4.3 mcg/L
Tacrolimus level: 4.3 mcg/L

## 2018-03-10 LAB — CMV BY PCR, QT
CMV IU/mL: <200 IU/mL (ref <200)
CMV log IU/mL: <2.3 log IU/mL (ref <2.30)

## 2018-03-10 LAB — CMV BY PCR QUANTITATIVE
CMV IU/ML: <200 IU/mL (ref <200)
CMV LOG IU/ML: <2.3 log IU/mL (ref <2.30)

## 2018-03-12 ENCOUNTER — Inpatient Hospital Stay: Admit: 2018-03-12 | Payer: BLUE CROSS/BLUE SHIELD | Primary: Internal Medicine

## 2018-03-12 DIAGNOSIS — C9100 Acute lymphoblastic leukemia not having achieved remission: Secondary | ICD-10-CM

## 2018-03-12 LAB — VANCOMYCIN, TROUGH: Vancomycin,trough: <0.8 ug/mL — ABNORMAL LOW (ref 15.0–20.0)

## 2018-03-12 LAB — CBC WITH AUTOMATED DIFF
BASOPHILS: 0.2 % (ref 0–3)
EOSINOPHILS: 0.8 % (ref 0–5)
HCT: 36.4 % — ABNORMAL LOW (ref 37.0–50.0)
HGB: 11.9 gm/dl — ABNORMAL LOW (ref 13.0–17.2)
IMMATURE GRANULOCYTES: 3.5 % — ABNORMAL HIGH (ref 0.0–3.0)
LYMPHOCYTES: 12 % — ABNORMAL LOW (ref 28–48)
MCH: 34.1 pg (ref 25.4–34.6)
MCHC: 32.7 gm/dl (ref 30.0–36.0)
MCV: 104.3 fL — ABNORMAL HIGH (ref 80.0–98.0)
MONOCYTES: 7.5 % (ref 1–13)
MPV: 9.5 fL (ref 6.0–10.0)
NEUTROPHILS: 76 % — ABNORMAL HIGH (ref 34–64)
NRBC: 0 (ref 0–0)
PLATELET: 154 10*3/uL (ref 140–450)
RBC: 3.49 M/uL — ABNORMAL LOW (ref 3.60–5.20)
RDW-SD: 51.7 — ABNORMAL HIGH (ref 36.4–46.3)
WBC: 6.2 10*3/uL (ref 4.0–11.0)

## 2018-03-12 LAB — METABOLIC PANEL, COMPREHENSIVE
ALT (SGPT): 30 U/L (ref 12–78)
AST (SGOT): 21 U/L (ref 15–37)
Albumin: 3.4 gm/dl (ref 3.4–5.0)
Alk. phosphatase: 89 U/L (ref 45–117)
Anion gap: 9 mmol/L (ref 5–15)
BUN: 8 mg/dl (ref 7–25)
Bilirubin, total: 0.8 mg/dl (ref 0.2–1.0)
CO2: 27 mEq/L (ref 21–32)
Calcium: 9.1 mg/dl (ref 8.5–10.1)
Chloride: 105 mEq/L (ref 98–107)
Creatinine: 0.9 mg/dl (ref 0.6–1.3)
GFR est AA: >60
GFR est non-AA: >60
Glucose: 166 mg/dl — ABNORMAL HIGH (ref 74–106)
Potassium: 3.6 mEq/L (ref 3.5–5.1)
Protein, total: 6.2 gm/dl — ABNORMAL LOW (ref 6.4–8.2)
Sodium: 141 mEq/L (ref 136–145)

## 2018-03-12 LAB — MAGNESIUM
Magnesium: 1.6 mg/dl (ref 1.6–2.6)
Magnesium: 1.6 mg/dl (ref 1.6–2.6)

## 2018-03-12 LAB — COMPREHENSIVE METABOLIC PANEL
ALT: 30 U/L (ref 12–78)
AST: 21 U/L (ref 15–37)
Albumin: 3.4 gm/dl (ref 3.4–5.0)
Alkaline Phosphatase: 89 U/L (ref 45–117)
Anion Gap: 9 mmol/L (ref 5–15)
BUN: 8 mg/dl (ref 7–25)
CO2: 27 mEq/L (ref 21–32)
Calcium: 9.1 mg/dl (ref 8.5–10.1)
Chloride: 105 mEq/L (ref 98–107)
Creatinine: 0.9 mg/dl (ref 0.6–1.3)
GFR African American: >60
Glucose: 166 mg/dl — ABNORMAL HIGH (ref 74–106)
Potassium: 3.6 mEq/L (ref 3.5–5.1)
Sodium: 141 mEq/L (ref 136–145)
Total Bilirubin: 0.8 mg/dl (ref 0.2–1.0)
Total Protein: 6.2 gm/dl — ABNORMAL LOW (ref 6.4–8.2)
eGFR NON-AA: >60

## 2018-03-12 LAB — CBC WITH AUTO DIFFERENTIAL
Basophils %: 0.2 % (ref 0–3)
Eosinophils %: 0.8 % (ref 0–5)
Hematocrit: 36.4 % — ABNORMAL LOW (ref 37.0–50.0)
Hemoglobin: 11.9 gm/dl — ABNORMAL LOW (ref 13.0–17.2)
Immature Granulocytes %: 3.5 % — ABNORMAL HIGH (ref 0.0–3.0)
Lymphocytes %: 12 % — ABNORMAL LOW (ref 28–48)
MCH: 34.1 pg (ref 25.4–34.6)
MCHC: 32.7 gm/dl (ref 30.0–36.0)
MCV: 104.3 fL — ABNORMAL HIGH (ref 80.0–98.0)
MPV: 9.5 fL (ref 6.0–10.0)
Monocytes %: 7.5 % (ref 1–13)
Neutrophils %: 76 % — ABNORMAL HIGH (ref 34–64)
Nucleated RBCs: 0 (ref 0–0)
Platelets: 154 10*3/uL (ref 140–450)
RBC: 3.49 M/uL — ABNORMAL LOW (ref 3.60–5.20)
RDW-SD: 51.7 — ABNORMAL HIGH (ref 36.4–46.3)
WBC: 6.2 10*3/uL (ref 4.0–11.0)

## 2018-03-12 LAB — VANCOMYCIN TROUGH: Vancomycin Tr: <0.8 ug/mL — ABNORMAL LOW (ref 15.0–20.0)

## 2018-03-16 ENCOUNTER — Inpatient Hospital Stay: Admit: 2018-03-16 | Payer: BLUE CROSS/BLUE SHIELD | Primary: Internal Medicine

## 2018-03-16 DIAGNOSIS — C9100 Acute lymphoblastic leukemia not having achieved remission: Secondary | ICD-10-CM

## 2018-03-16 LAB — CBC WITH AUTOMATED DIFF
BASOPHILS: 0.4 % (ref 0–3)
EOSINOPHILS: 0.9 % (ref 0–5)
HCT: 35.2 % — ABNORMAL LOW (ref 37.0–50.0)
HGB: 12 gm/dl — ABNORMAL LOW (ref 13.0–17.2)
IMMATURE GRANULOCYTES: 1.6 % (ref 0.0–3.0)
LYMPHOCYTES: 9.9 % — ABNORMAL LOW (ref 28–48)
MCH: 35.1 pg — ABNORMAL HIGH (ref 25.4–34.6)
MCHC: 34.1 gm/dl (ref 30.0–36.0)
MCV: 102.9 fL — ABNORMAL HIGH (ref 80.0–98.0)
MONOCYTES: 7.9 % (ref 1–13)
MPV: 9.6 fL (ref 6.0–10.0)
NEUTROPHILS: 79.3 % — ABNORMAL HIGH (ref 34–64)
NRBC: 0 (ref 0–0)
PLATELET: 162 10*3/uL (ref 140–450)
RBC: 3.42 M/uL — ABNORMAL LOW (ref 3.60–5.20)
RDW-SD: 50 — ABNORMAL HIGH (ref 36.4–46.3)
WBC: 7 10*3/uL (ref 4.0–11.0)

## 2018-03-16 LAB — METABOLIC PANEL, COMPREHENSIVE
ALT (SGPT): 36 U/L (ref 12–78)
AST (SGOT): 28 U/L (ref 15–37)
Albumin: 3.3 gm/dl — ABNORMAL LOW (ref 3.4–5.0)
Alk. phosphatase: 91 U/L (ref 45–117)
Anion gap: 7 mmol/L (ref 5–15)
BUN: 12 mg/dl (ref 7–25)
Bilirubin, total: 0.5 mg/dl (ref 0.2–1.0)
CO2: 27 mEq/L (ref 21–32)
Calcium: 8.7 mg/dl (ref 8.5–10.1)
Chloride: 105 mEq/L (ref 98–107)
Creatinine: 0.9 mg/dl (ref 0.6–1.3)
GFR est AA: >60
GFR est non-AA: >60
Glucose: 158 mg/dl — ABNORMAL HIGH (ref 74–106)
Potassium: 4.4 mEq/L (ref 3.5–5.1)
Protein, total: 6.2 gm/dl — ABNORMAL LOW (ref 6.4–8.2)
Sodium: 139 mEq/L (ref 136–145)

## 2018-03-16 LAB — MAGNESIUM
Magnesium: 1.6 mg/dl (ref 1.6–2.6)
Magnesium: 1.6 mg/dl (ref 1.6–2.6)

## 2018-03-16 LAB — COMPREHENSIVE METABOLIC PANEL
ALT: 36 U/L (ref 12–78)
AST: 28 U/L (ref 15–37)
Albumin: 3.3 gm/dl — ABNORMAL LOW (ref 3.4–5.0)
Alkaline Phosphatase: 91 U/L (ref 45–117)
Anion Gap: 7 mmol/L (ref 5–15)
BUN: 12 mg/dl (ref 7–25)
CO2: 27 mEq/L (ref 21–32)
Calcium: 8.7 mg/dl (ref 8.5–10.1)
Chloride: 105 mEq/L (ref 98–107)
Creatinine: 0.9 mg/dl (ref 0.6–1.3)
EGFR IF NonAfrican American: >60
GFR African American: >60
Glucose: 158 mg/dl — ABNORMAL HIGH (ref 74–106)
Potassium: 4.4 mEq/L (ref 3.5–5.1)
Sodium: 139 mEq/L (ref 136–145)
Total Bilirubin: 0.5 mg/dl (ref 0.2–1.0)
Total Protein: 6.2 gm/dl — ABNORMAL LOW (ref 6.4–8.2)

## 2018-03-16 LAB — CBC WITH AUTO DIFFERENTIAL
Basophils %: 0.4 % (ref 0–3)
Eosinophils %: 0.9 % (ref 0–5)
Hematocrit: 35.2 % — ABNORMAL LOW (ref 37.0–50.0)
Hemoglobin: 12 gm/dl — ABNORMAL LOW (ref 13.0–17.2)
Immature Granulocytes: 1.6 % (ref 0.0–3.0)
Lymphocytes %: 9.9 % — ABNORMAL LOW (ref 28–48)
MCH: 35.1 pg — ABNORMAL HIGH (ref 25.4–34.6)
MCHC: 34.1 gm/dl (ref 30.0–36.0)
MCV: 102.9 fL — ABNORMAL HIGH (ref 80.0–98.0)
MPV: 9.6 fL (ref 6.0–10.0)
Monocytes %: 7.9 % (ref 1–13)
Neutrophils %: 79.3 % — ABNORMAL HIGH (ref 34–64)
Nucleated RBCs: 0 (ref 0–0)
Platelets: 162 10*3/uL (ref 140–450)
RBC: 3.42 M/uL — ABNORMAL LOW (ref 3.60–5.20)
RDW-SD: 50 — ABNORMAL HIGH (ref 36.4–46.3)
WBC: 7 10*3/uL (ref 4.0–11.0)

## 2018-03-18 LAB — CMV BY PCR, QT
CMV IU/mL: <200 IU/mL (ref <200)
CMV log IU/mL: <2.3 log IU/mL (ref <2.30)

## 2018-03-18 LAB — CMV BY PCR QUANTITATIVE
CMV IU/ML: <200 IU/mL (ref <200)
CMV LOG IU/ML: <2.3 log IU/mL (ref <2.30)

## 2018-03-19 ENCOUNTER — Inpatient Hospital Stay: Admit: 2018-03-19 | Payer: BLUE CROSS/BLUE SHIELD | Primary: Internal Medicine

## 2018-03-19 DIAGNOSIS — C9300 Acute monoblastic/monocytic leukemia, not having achieved remission: Secondary | ICD-10-CM

## 2018-03-19 LAB — TACROLIMUS, WHOLE BLOOD
TACROLIMUS,XTACR1: 5 mcg/L
Tacrolimus level: 5 mcg/L

## 2018-03-19 LAB — CBC WITH AUTOMATED DIFF
BASOPHILS: 0.3 % (ref 0–3)
EOSINOPHILS: 0.5 % (ref 0–5)
HCT: 36 % — ABNORMAL LOW (ref 37.0–50.0)
HGB: 12.2 gm/dl — ABNORMAL LOW (ref 13.0–17.2)
IMMATURE GRANULOCYTES: 1.1 % (ref 0.0–3.0)
LYMPHOCYTES: 14.2 % — ABNORMAL LOW (ref 28–48)
MCH: 34.7 pg — ABNORMAL HIGH (ref 25.4–34.6)
MCHC: 33.9 gm/dl (ref 30.0–36.0)
MCV: 102.3 fL — ABNORMAL HIGH (ref 80.0–98.0)
MONOCYTES: 6.4 % (ref 1–13)
MPV: 9.8 fL (ref 6.0–10.0)
NEUTROPHILS: 77.5 % — ABNORMAL HIGH (ref 34–64)
NRBC: 0 (ref 0–0)
PLATELET: 168 10*3/uL (ref 140–450)
RBC: 3.52 M/uL — ABNORMAL LOW (ref 3.60–5.20)
RDW-SD: 49.2 — ABNORMAL HIGH (ref 36.4–46.3)
WBC: 6.6 10*3/uL (ref 4.0–11.0)

## 2018-03-19 LAB — METABOLIC PANEL, COMPREHENSIVE
ALT (SGPT): 34 U/L (ref 12–78)
AST (SGOT): 20 U/L (ref 15–37)
Albumin: 3.4 gm/dl (ref 3.4–5.0)
Alk. phosphatase: 91 U/L (ref 45–117)
Anion gap: 6 mmol/L (ref 5–15)
BUN: 13 mg/dl (ref 7–25)
Bilirubin, total: 0.9 mg/dl (ref 0.2–1.0)
CO2: 28 mEq/L (ref 21–32)
Calcium: 9.3 mg/dl (ref 8.5–10.1)
Chloride: 103 mEq/L (ref 98–107)
Creatinine: 0.8 mg/dl (ref 0.6–1.3)
GFR est AA: >60
GFR est non-AA: >60
Glucose: 141 mg/dl — ABNORMAL HIGH (ref 74–106)
Potassium: 3.6 mEq/L (ref 3.5–5.1)
Protein, total: 6.4 gm/dl (ref 6.4–8.2)
Sodium: 137 mEq/L (ref 136–145)

## 2018-03-19 LAB — MAGNESIUM
Magnesium: 1.6 mg/dl (ref 1.6–2.6)
Magnesium: 1.6 mg/dl (ref 1.6–2.6)

## 2018-03-19 LAB — CBC WITH AUTO DIFFERENTIAL
Basophils %: 0.3 % (ref 0–3)
Eosinophils %: 0.5 % (ref 0–5)
Hematocrit: 36 % — ABNORMAL LOW (ref 37.0–50.0)
Hemoglobin: 12.2 gm/dl — ABNORMAL LOW (ref 13.0–17.2)
Immature Granulocytes %: 1.1 % (ref 0.0–3.0)
Lymphocytes %: 14.2 % — ABNORMAL LOW (ref 28–48)
MCH: 34.7 pg — ABNORMAL HIGH (ref 25.4–34.6)
MCHC: 33.9 gm/dl (ref 30.0–36.0)
MCV: 102.3 fL — ABNORMAL HIGH (ref 80.0–98.0)
MPV: 9.8 fL (ref 6.0–10.0)
Monocytes %: 6.4 % (ref 1–13)
Neutrophils %: 77.5 % — ABNORMAL HIGH (ref 34–64)
Nucleated RBCs: 0 (ref 0–0)
Platelets: 168 10*3/uL (ref 140–450)
RBC: 3.52 M/uL — ABNORMAL LOW (ref 3.60–5.20)
RDW-SD: 49.2 — ABNORMAL HIGH (ref 36.4–46.3)
WBC: 6.6 10*3/uL (ref 4.0–11.0)

## 2018-03-19 LAB — COMPREHENSIVE METABOLIC PANEL
ALT: 34 U/L (ref 12–78)
AST: 20 U/L (ref 15–37)
Albumin: 3.4 gm/dl (ref 3.4–5.0)
Alkaline Phosphatase: 91 U/L (ref 45–117)
Anion Gap: 6 mmol/L (ref 5–15)
BUN: 13 mg/dl (ref 7–25)
CO2: 28 mEq/L (ref 21–32)
Calcium: 9.3 mg/dl (ref 8.5–10.1)
Chloride: 103 mEq/L (ref 98–107)
Creatinine: 0.8 mg/dl (ref 0.6–1.3)
GFR African American: >60
Glucose: 141 mg/dl — ABNORMAL HIGH (ref 74–106)
Potassium: 3.6 mEq/L (ref 3.5–5.1)
Sodium: 137 mEq/L (ref 136–145)
Total Bilirubin: 0.9 mg/dl (ref 0.2–1.0)
Total Protein: 6.4 gm/dl (ref 6.4–8.2)
eGFR NON-AA: >60

## 2018-03-23 ENCOUNTER — Inpatient Hospital Stay: Admit: 2018-03-23 | Payer: BLUE CROSS/BLUE SHIELD | Primary: Internal Medicine

## 2018-03-23 DIAGNOSIS — C9300 Acute monoblastic/monocytic leukemia, not having achieved remission: Secondary | ICD-10-CM

## 2018-03-23 LAB — CBC WITH AUTOMATED DIFF
BASOPHILS: 0.3 % (ref 0–3)
EOSINOPHILS: 0.8 % (ref 0–5)
HCT: 36.4 % — ABNORMAL LOW (ref 37.0–50.0)
HGB: 11.9 gm/dl — ABNORMAL LOW (ref 13.0–17.2)
IMMATURE GRANULOCYTES: 0.6 % (ref 0.0–3.0)
LYMPHOCYTES: 11.4 % — ABNORMAL LOW (ref 28–48)
MCH: 34.3 pg (ref 25.4–34.6)
MCHC: 32.7 gm/dl (ref 30.0–36.0)
MCV: 104.9 fL — ABNORMAL HIGH (ref 80.0–98.0)
MONOCYTES: 6.8 % (ref 1–13)
MPV: 9.3 fL (ref 6.0–10.0)
NEUTROPHILS: 80.1 % — ABNORMAL HIGH (ref 34–64)
NRBC: 0 (ref 0–0)
PLATELET: 169 10*3/uL (ref 140–450)
RBC: 3.47 M/uL — ABNORMAL LOW (ref 3.60–5.20)
RDW-SD: 51.5 — ABNORMAL HIGH (ref 36.4–46.3)
WBC: 6.6 10*3/uL (ref 4.0–11.0)

## 2018-03-23 LAB — METABOLIC PANEL, COMPREHENSIVE
ALT (SGPT): 39 U/L (ref 12–78)
AST (SGOT): 23 U/L (ref 15–37)
Albumin: 3.2 gm/dl — ABNORMAL LOW (ref 3.4–5.0)
Alk. phosphatase: 88 U/L (ref 45–117)
Anion gap: 6 mmol/L (ref 5–15)
BUN: 12 mg/dl (ref 7–25)
Bilirubin, total: 0.6 mg/dl (ref 0.2–1.0)
CO2: 29 mEq/L (ref 21–32)
Calcium: 8.7 mg/dl (ref 8.5–10.1)
Chloride: 108 mEq/L — ABNORMAL HIGH (ref 98–107)
Creatinine: 0.7 mg/dl (ref 0.6–1.3)
GFR est AA: >60
GFR est non-AA: >60
Glucose: 95 mg/dl (ref 74–106)
Potassium: 4.2 mEq/L (ref 3.5–5.1)
Protein, total: 6.1 gm/dl — ABNORMAL LOW (ref 6.4–8.2)
Sodium: 143 mEq/L (ref 136–145)

## 2018-03-23 LAB — MAGNESIUM
Magnesium: 1.7 mg/dl (ref 1.6–2.6)
Magnesium: 1.7 mg/dl (ref 1.6–2.6)

## 2018-03-23 LAB — CBC WITH AUTO DIFFERENTIAL
Basophils %: 0.3 % (ref 0–3)
Eosinophils %: 0.8 % (ref 0–5)
Hematocrit: 36.4 % — ABNORMAL LOW (ref 37.0–50.0)
Hemoglobin: 11.9 gm/dl — ABNORMAL LOW (ref 13.0–17.2)
Immature Granulocytes %: 0.6 % (ref 0.0–3.0)
Lymphocytes %: 11.4 % — ABNORMAL LOW (ref 28–48)
MCH: 34.3 pg (ref 25.4–34.6)
MCHC: 32.7 gm/dl (ref 30.0–36.0)
MCV: 104.9 fL — ABNORMAL HIGH (ref 80.0–98.0)
MPV: 9.3 fL (ref 6.0–10.0)
Monocytes %: 6.8 % (ref 1–13)
Neutrophils %: 80.1 % — ABNORMAL HIGH (ref 34–64)
Nucleated RBCs: 0 (ref 0–0)
Platelets: 169 10*3/uL (ref 140–450)
RBC: 3.47 M/uL — ABNORMAL LOW (ref 3.60–5.20)
RDW-SD: 51.5 — ABNORMAL HIGH (ref 36.4–46.3)
WBC: 6.6 10*3/uL (ref 4.0–11.0)

## 2018-03-23 LAB — COMPREHENSIVE METABOLIC PANEL
ALT: 39 U/L (ref 12–78)
AST: 23 U/L (ref 15–37)
Albumin: 3.2 gm/dl — ABNORMAL LOW (ref 3.4–5.0)
Alkaline Phosphatase: 88 U/L (ref 45–117)
Anion Gap: 6 mmol/L (ref 5–15)
BUN: 12 mg/dl (ref 7–25)
CO2: 29 mEq/L (ref 21–32)
Calcium: 8.7 mg/dl (ref 8.5–10.1)
Chloride: 108 mEq/L — ABNORMAL HIGH (ref 98–107)
Creatinine: 0.7 mg/dl (ref 0.6–1.3)
GFR African American: >60
Glucose: 95 mg/dl (ref 74–106)
Potassium: 4.2 mEq/L (ref 3.5–5.1)
Sodium: 143 mEq/L (ref 136–145)
Total Bilirubin: 0.6 mg/dl (ref 0.2–1.0)
Total Protein: 6.1 gm/dl — ABNORMAL LOW (ref 6.4–8.2)
eGFR NON-AA: >60

## 2018-03-25 LAB — CMV BY PCR, QT
CMV IU/mL: <200 IU/mL (ref <200)
CMV log IU/mL: <2.3 log IU/mL (ref <2.30)

## 2018-03-25 LAB — TACROLIMUS, WHOLE BLOOD
TACROLIMUS,XTACR1: 6.4 mcg/L
Tacrolimus level: 6.4 mcg/L

## 2018-03-25 LAB — CMV BY PCR QUANTITATIVE
CMV IU/ML: <200 IU/mL (ref <200)
CMV LOG IU/ML: <2.3 log IU/mL (ref <2.30)

## 2018-03-27 ENCOUNTER — Inpatient Hospital Stay: Admit: 2018-03-27 | Payer: BLUE CROSS/BLUE SHIELD | Primary: Internal Medicine

## 2018-03-27 DIAGNOSIS — C9100 Acute lymphoblastic leukemia not having achieved remission: Secondary | ICD-10-CM

## 2018-03-27 LAB — CBC WITH AUTOMATED DIFF
BASOPHILS: 0.3 % (ref 0–3)
EOSINOPHILS: 1 % (ref 0–5)
HCT: 37.9 % (ref 37.0–50.0)
HGB: 12.4 gm/dl — ABNORMAL LOW (ref 13.0–17.2)
IMMATURE GRANULOCYTES: 0.4 % (ref 0.0–3.0)
LYMPHOCYTES: 10.1 % — ABNORMAL LOW (ref 28–48)
MCH: 34.3 pg (ref 25.4–34.6)
MCHC: 32.7 gm/dl (ref 30.0–36.0)
MCV: 104.7 fL — ABNORMAL HIGH (ref 80.0–98.0)
MONOCYTES: 7.8 % (ref 1–13)
MPV: 9.9 fL (ref 6.0–10.0)
NEUTROPHILS: 80.4 % — ABNORMAL HIGH (ref 34–64)
NRBC: 0 (ref 0–0)
PLATELET: 167 10*3/uL (ref 140–450)
RBC: 3.62 M/uL (ref 3.60–5.20)
RDW-SD: 52.4 — ABNORMAL HIGH (ref 36.4–46.3)
WBC: 7.8 10*3/uL (ref 4.0–11.0)

## 2018-03-27 LAB — METABOLIC PANEL, COMPREHENSIVE
ALT (SGPT): 39 U/L (ref 12–78)
AST (SGOT): 16 U/L (ref 15–37)
Albumin: 3.4 gm/dl (ref 3.4–5.0)
Alk. phosphatase: 101 U/L (ref 45–117)
Anion gap: 7 mmol/L (ref 5–15)
BUN: 11 mg/dl (ref 7–25)
Bilirubin, total: 0.5 mg/dl (ref 0.2–1.0)
CO2: 29 mEq/L (ref 21–32)
Calcium: 8.8 mg/dl (ref 8.5–10.1)
Chloride: 106 mEq/L (ref 98–107)
Creatinine: 0.8 mg/dl (ref 0.6–1.3)
GFR est AA: >60
GFR est non-AA: >60
Glucose: 192 mg/dl — ABNORMAL HIGH (ref 74–106)
Potassium: 4.4 mEq/L (ref 3.5–5.1)
Protein, total: 6.5 gm/dl (ref 6.4–8.2)
Sodium: 142 mEq/L (ref 136–145)

## 2018-03-27 LAB — MAGNESIUM
Magnesium: 1.4 mg/dl — ABNORMAL LOW (ref 1.6–2.6)
Magnesium: 1.4 mg/dl — ABNORMAL LOW (ref 1.6–2.6)

## 2018-03-27 LAB — COMPREHENSIVE METABOLIC PANEL
ALT: 39 U/L (ref 12–78)
AST: 16 U/L (ref 15–37)
Albumin: 3.4 gm/dl (ref 3.4–5.0)
Alkaline Phosphatase: 101 U/L (ref 45–117)
Anion Gap: 7 mmol/L (ref 5–15)
BUN: 11 mg/dl (ref 7–25)
CO2: 29 mEq/L (ref 21–32)
Calcium: 8.8 mg/dl (ref 8.5–10.1)
Chloride: 106 mEq/L (ref 98–107)
Creatinine: 0.8 mg/dl (ref 0.6–1.3)
GFR African American: >60
Glucose: 192 mg/dl — ABNORMAL HIGH (ref 74–106)
Potassium: 4.4 mEq/L (ref 3.5–5.1)
Sodium: 142 mEq/L (ref 136–145)
Total Bilirubin: 0.5 mg/dl (ref 0.2–1.0)
Total Protein: 6.5 gm/dl (ref 6.4–8.2)
eGFR NON-AA: >60

## 2018-03-27 LAB — CBC WITH AUTO DIFFERENTIAL
Basophils %: 0.3 % (ref 0–3)
Eosinophils %: 1 % (ref 0–5)
Hematocrit: 37.9 % (ref 37.0–50.0)
Hemoglobin: 12.4 gm/dl — ABNORMAL LOW (ref 13.0–17.2)
Immature Granulocytes %: 0.4 % (ref 0.0–3.0)
Lymphocytes %: 10.1 % — ABNORMAL LOW (ref 28–48)
MCH: 34.3 pg (ref 25.4–34.6)
MCHC: 32.7 gm/dl (ref 30.0–36.0)
MCV: 104.7 fL — ABNORMAL HIGH (ref 80.0–98.0)
MPV: 9.9 fL (ref 6.0–10.0)
Monocytes %: 7.8 % (ref 1–13)
Neutrophils %: 80.4 % — ABNORMAL HIGH (ref 34–64)
Nucleated RBCs: 0 (ref 0–0)
Platelets: 167 10*3/uL (ref 140–450)
RBC: 3.62 M/uL (ref 3.60–5.20)
RDW-SD: 52.4 — ABNORMAL HIGH (ref 36.4–46.3)
WBC: 7.8 10*3/uL (ref 4.0–11.0)

## 2018-04-03 ENCOUNTER — Inpatient Hospital Stay: Admit: 2018-04-03 | Payer: BLUE CROSS/BLUE SHIELD | Primary: Internal Medicine

## 2018-04-03 DIAGNOSIS — C9100 Acute lymphoblastic leukemia not having achieved remission: Secondary | ICD-10-CM

## 2018-04-03 LAB — CBC WITH AUTOMATED DIFF
BASOPHILS: 0.5 % (ref 0–3)
EOSINOPHILS: 0.8 % (ref 0–5)
HCT: 33.5 % — ABNORMAL LOW (ref 37.0–50.0)
HGB: 11 gm/dl — ABNORMAL LOW (ref 13.0–17.2)
IMMATURE GRANULOCYTES: 0.5 % (ref 0.0–3.0)
LYMPHOCYTES: 11.1 % — ABNORMAL LOW (ref 28–48)
MCH: 34.1 pg (ref 25.4–34.6)
MCHC: 32.8 gm/dl (ref 30.0–36.0)
MCV: 103.7 fL — ABNORMAL HIGH (ref 80.0–98.0)
MONOCYTES: 7.5 % (ref 1–13)
MPV: 9.2 fL (ref 6.0–10.0)
NEUTROPHILS: 79.6 % — ABNORMAL HIGH (ref 34–64)
NRBC: 0 (ref 0–0)
PLATELET: 142 10*3/uL (ref 140–450)
RBC: 3.23 M/uL — ABNORMAL LOW (ref 3.60–5.20)
RDW-SD: 50.8 — ABNORMAL HIGH (ref 36.4–46.3)
WBC: 6.4 10*3/uL (ref 4.0–11.0)

## 2018-04-03 LAB — METABOLIC PANEL, COMPREHENSIVE
ALT (SGPT): 34 U/L (ref 12–78)
AST (SGOT): 16 U/L (ref 15–37)
Albumin: 2.9 gm/dl — ABNORMAL LOW (ref 3.4–5.0)
Alk. phosphatase: 88 U/L (ref 45–117)
Anion gap: 8 mmol/L (ref 5–15)
BUN: 14 mg/dl (ref 7–25)
Bilirubin, total: 0.7 mg/dl (ref 0.2–1.0)
CO2: 26 mEq/L (ref 21–32)
Calcium: 8.6 mg/dl (ref 8.5–10.1)
Chloride: 108 mEq/L — ABNORMAL HIGH (ref 98–107)
Creatinine: 0.8 mg/dl (ref 0.6–1.3)
GFR est AA: >60
GFR est non-AA: >60
Glucose: 124 mg/dl — ABNORMAL HIGH (ref 74–106)
Potassium: 3.7 mEq/L (ref 3.5–5.1)
Protein, total: 5.4 gm/dl — ABNORMAL LOW (ref 6.4–8.2)
Sodium: 142 mEq/L (ref 136–145)

## 2018-04-03 LAB — MAGNESIUM
Magnesium: 1.3 mg/dl — ABNORMAL LOW (ref 1.6–2.6)
Magnesium: 1.3 mg/dl — ABNORMAL LOW (ref 1.6–2.6)

## 2018-04-03 LAB — CBC WITH AUTO DIFFERENTIAL
Basophils %: 0.5 % (ref 0–3)
Eosinophils %: 0.8 % (ref 0–5)
Hematocrit: 33.5 % — ABNORMAL LOW (ref 37.0–50.0)
Hemoglobin: 11 gm/dl — ABNORMAL LOW (ref 13.0–17.2)
Immature Granulocytes %: 0.5 % (ref 0.0–3.0)
Lymphocytes %: 11.1 % — ABNORMAL LOW (ref 28–48)
MCH: 34.1 pg (ref 25.4–34.6)
MCHC: 32.8 gm/dl (ref 30.0–36.0)
MCV: 103.7 fL — ABNORMAL HIGH (ref 80.0–98.0)
MPV: 9.2 fL (ref 6.0–10.0)
Monocytes %: 7.5 % (ref 1–13)
Neutrophils %: 79.6 % — ABNORMAL HIGH (ref 34–64)
Nucleated RBCs: 0 (ref 0–0)
Platelets: 142 10*3/uL (ref 140–450)
RBC: 3.23 M/uL — ABNORMAL LOW (ref 3.60–5.20)
RDW-SD: 50.8 — ABNORMAL HIGH (ref 36.4–46.3)
WBC: 6.4 10*3/uL (ref 4.0–11.0)

## 2018-04-03 LAB — COMPREHENSIVE METABOLIC PANEL
ALT: 34 U/L (ref 12–78)
AST: 16 U/L (ref 15–37)
Albumin: 2.9 gm/dl — ABNORMAL LOW (ref 3.4–5.0)
Alkaline Phosphatase: 88 U/L (ref 45–117)
Anion Gap: 8 mmol/L (ref 5–15)
BUN: 14 mg/dl (ref 7–25)
CO2: 26 mEq/L (ref 21–32)
Calcium: 8.6 mg/dl (ref 8.5–10.1)
Chloride: 108 mEq/L — ABNORMAL HIGH (ref 98–107)
Creatinine: 0.8 mg/dl (ref 0.6–1.3)
GFR African American: >60
Glucose: 124 mg/dl — ABNORMAL HIGH (ref 74–106)
Potassium: 3.7 mEq/L (ref 3.5–5.1)
Sodium: 142 mEq/L (ref 136–145)
Total Bilirubin: 0.7 mg/dl (ref 0.2–1.0)
Total Protein: 5.4 gm/dl — ABNORMAL LOW (ref 6.4–8.2)
eGFR NON-AA: >60

## 2018-04-11 ENCOUNTER — Inpatient Hospital Stay: Admit: 2018-04-11 | Payer: BLUE CROSS/BLUE SHIELD | Primary: Internal Medicine

## 2018-04-11 DIAGNOSIS — C9100 Acute lymphoblastic leukemia not having achieved remission: Secondary | ICD-10-CM

## 2018-04-11 LAB — METABOLIC PANEL, COMPREHENSIVE
ALT (SGPT): 29 U/L (ref 12–78)
AST (SGOT): 17 U/L (ref 15–37)
Albumin: 3.2 gm/dl — ABNORMAL LOW (ref 3.4–5.0)
Alk. phosphatase: 99 U/L (ref 45–117)
Anion gap: 5 mmol/L (ref 5–15)
BUN: 12 mg/dl (ref 7–25)
Bilirubin, total: 0.6 mg/dl (ref 0.2–1.0)
CO2: 28 mEq/L (ref 21–32)
Calcium: 8.9 mg/dl (ref 8.5–10.1)
Chloride: 106 mEq/L (ref 98–107)
Creatinine: 1 mg/dl (ref 0.6–1.3)
GFR est AA: >60
GFR est non-AA: >60
Glucose: 152 mg/dl — ABNORMAL HIGH (ref 74–106)
Potassium: 4.1 mEq/L (ref 3.5–5.1)
Protein, total: 5.9 gm/dl — ABNORMAL LOW (ref 6.4–8.2)
Sodium: 139 mEq/L (ref 136–145)

## 2018-04-11 LAB — CBC WITH AUTOMATED DIFF
BASOPHILS: 0.3 % (ref 0–3)
EOSINOPHILS: 0.9 % (ref 0–5)
HCT: 35.5 % — ABNORMAL LOW (ref 37.0–50.0)
HGB: 11.8 gm/dl — ABNORMAL LOW (ref 13.0–17.2)
IMMATURE GRANULOCYTES: 0.6 % (ref 0.0–3.0)
LYMPHOCYTES: 7.9 % — ABNORMAL LOW (ref 28–48)
MCH: 34.6 pg (ref 25.4–34.6)
MCHC: 33.2 gm/dl (ref 30.0–36.0)
MCV: 104.1 fL — ABNORMAL HIGH (ref 80.0–98.0)
MONOCYTES: 6.3 % (ref 1–13)
MPV: 9.9 fL (ref 6.0–10.0)
NEUTROPHILS: 84 % — ABNORMAL HIGH (ref 34–64)
NRBC: 0 (ref 0–0)
PLATELET: 171 10*3/uL (ref 140–450)
RBC: 3.41 M/uL — ABNORMAL LOW (ref 3.60–5.20)
RDW-SD: 51.6 — ABNORMAL HIGH (ref 36.4–46.3)
WBC: 9 10*3/uL (ref 4.0–11.0)

## 2018-04-11 LAB — PLATELET COUNT

## 2018-04-11 LAB — MAGNESIUM
Magnesium: 1.4 mg/dl — ABNORMAL LOW (ref 1.6–2.6)
Magnesium: 1.4 mg/dl — ABNORMAL LOW (ref 1.6–2.6)

## 2018-04-11 LAB — COMPREHENSIVE METABOLIC PANEL
ALT: 29 U/L (ref 12–78)
AST: 17 U/L (ref 15–37)
Albumin: 3.2 gm/dl — ABNORMAL LOW (ref 3.4–5.0)
Alkaline Phosphatase: 99 U/L (ref 45–117)
Anion Gap: 5 mmol/L (ref 5–15)
BUN: 12 mg/dl (ref 7–25)
CO2: 28 mEq/L (ref 21–32)
Calcium: 8.9 mg/dl (ref 8.5–10.1)
Chloride: 106 mEq/L (ref 98–107)
Creatinine: 1 mg/dl (ref 0.6–1.3)
GFR African American: >60
Glucose: 152 mg/dl — ABNORMAL HIGH (ref 74–106)
Potassium: 4.1 mEq/L (ref 3.5–5.1)
Sodium: 139 mEq/L (ref 136–145)
Total Bilirubin: 0.6 mg/dl (ref 0.2–1.0)
Total Protein: 5.9 gm/dl — ABNORMAL LOW (ref 6.4–8.2)
eGFR NON-AA: >60

## 2018-04-11 LAB — CBC WITH AUTO DIFFERENTIAL
Basophils %: 0.3 % (ref 0–3)
Eosinophils %: 0.9 % (ref 0–5)
Hematocrit: 35.5 % — ABNORMAL LOW (ref 37.0–50.0)
Hemoglobin: 11.8 gm/dl — ABNORMAL LOW (ref 13.0–17.2)
Immature Granulocytes %: 0.6 % (ref 0.0–3.0)
Lymphocytes %: 7.9 % — ABNORMAL LOW (ref 28–48)
MCH: 34.6 pg (ref 25.4–34.6)
MCHC: 33.2 gm/dl (ref 30.0–36.0)
MCV: 104.1 fL — ABNORMAL HIGH (ref 80.0–98.0)
MPV: 9.9 fL (ref 6.0–10.0)
Monocytes %: 6.3 % (ref 1–13)
Neutrophils %: 84 % — ABNORMAL HIGH (ref 34–64)
Nucleated RBCs: 0 (ref 0–0)
Platelets: 171 10*3/uL (ref 140–450)
RBC: 3.41 M/uL — ABNORMAL LOW (ref 3.60–5.20)
RDW-SD: 51.6 — ABNORMAL HIGH (ref 36.4–46.3)
WBC: 9 10*3/uL (ref 4.0–11.0)

## 2018-04-13 ENCOUNTER — Inpatient Hospital Stay: Admit: 2018-04-13 | Payer: BLUE CROSS/BLUE SHIELD | Primary: Internal Medicine

## 2018-04-13 DIAGNOSIS — C9100 Acute lymphoblastic leukemia not having achieved remission: Secondary | ICD-10-CM

## 2018-04-13 LAB — CBC WITH AUTOMATED DIFF
BASOPHILS: 0.2 % (ref 0–3)
EOSINOPHILS: 0.7 % (ref 0–5)
HCT: 37 % (ref 37.0–50.0)
HGB: 12.3 gm/dl — ABNORMAL LOW (ref 13.0–17.2)
IMMATURE GRANULOCYTES: 0.5 % (ref 0.0–3.0)
LYMPHOCYTES: 9 % — ABNORMAL LOW (ref 28–48)
MCH: 34.5 pg (ref 25.4–34.6)
MCHC: 33.2 gm/dl (ref 30.0–36.0)
MCV: 103.6 fL — ABNORMAL HIGH (ref 80.0–98.0)
MONOCYTES: 6.7 % (ref 1–13)
MPV: 9.4 fL (ref 6.0–10.0)
NEUTROPHILS: 82.9 % — ABNORMAL HIGH (ref 34–64)
NRBC: 0 (ref 0–0)
PLATELET: 182 10*3/uL (ref 140–450)
RBC: 3.57 M/uL — ABNORMAL LOW (ref 3.60–5.20)
RDW-SD: 51.1 — ABNORMAL HIGH (ref 36.4–46.3)
WBC: 8.3 10*3/uL (ref 4.0–11.0)

## 2018-04-13 LAB — METABOLIC PANEL, COMPREHENSIVE
ALT (SGPT): 24 U/L (ref 12–78)
AST (SGOT): 13 U/L — ABNORMAL LOW (ref 15–37)
Albumin: 3.3 gm/dl — ABNORMAL LOW (ref 3.4–5.0)
Alk. phosphatase: 97 U/L (ref 45–117)
Anion gap: 6 mmol/L (ref 5–15)
BUN: 12 mg/dl (ref 7–25)
Bilirubin, total: 1.1 mg/dl — ABNORMAL HIGH (ref 0.2–1.0)
CO2: 31 mEq/L (ref 21–32)
Calcium: 9.5 mg/dl (ref 8.5–10.1)
Chloride: 104 mEq/L (ref 98–107)
Creatinine: 0.8 mg/dl (ref 0.6–1.3)
GFR est AA: >60
GFR est non-AA: >60
Glucose: 104 mg/dl (ref 74–106)
Potassium: 4.3 mEq/L (ref 3.5–5.1)
Protein, total: 6.2 gm/dl — ABNORMAL LOW (ref 6.4–8.2)
Sodium: 140 mEq/L (ref 136–145)

## 2018-04-13 LAB — MAGNESIUM
Magnesium: 1.6 mg/dl (ref 1.6–2.6)
Magnesium: 1.6 mg/dl (ref 1.6–2.6)

## 2018-04-13 LAB — CBC WITH AUTO DIFFERENTIAL
Basophils %: 0.2 % (ref 0–3)
Eosinophils %: 0.7 % (ref 0–5)
Hematocrit: 37 % (ref 37.0–50.0)
Hemoglobin: 12.3 gm/dl — ABNORMAL LOW (ref 13.0–17.2)
Immature Granulocytes %: 0.5 % (ref 0.0–3.0)
Lymphocytes %: 9 % — ABNORMAL LOW (ref 28–48)
MCH: 34.5 pg (ref 25.4–34.6)
MCHC: 33.2 gm/dl (ref 30.0–36.0)
MCV: 103.6 fL — ABNORMAL HIGH (ref 80.0–98.0)
MPV: 9.4 fL (ref 6.0–10.0)
Monocytes %: 6.7 % (ref 1–13)
Neutrophils %: 82.9 % — ABNORMAL HIGH (ref 34–64)
Nucleated RBCs: 0 (ref 0–0)
Platelets: 182 10*3/uL (ref 140–450)
RBC: 3.57 M/uL — ABNORMAL LOW (ref 3.60–5.20)
RDW-SD: 51.1 — ABNORMAL HIGH (ref 36.4–46.3)
WBC: 8.3 10*3/uL (ref 4.0–11.0)

## 2018-04-13 LAB — COMPREHENSIVE METABOLIC PANEL
ALT: 24 U/L (ref 12–78)
AST: 13 U/L — ABNORMAL LOW (ref 15–37)
Albumin: 3.3 gm/dl — ABNORMAL LOW (ref 3.4–5.0)
Alkaline Phosphatase: 97 U/L (ref 45–117)
Anion Gap: 6 mmol/L (ref 5–15)
BUN: 12 mg/dl (ref 7–25)
CO2: 31 mEq/L (ref 21–32)
Calcium: 9.5 mg/dl (ref 8.5–10.1)
Chloride: 104 mEq/L (ref 98–107)
Creatinine: 0.8 mg/dl (ref 0.6–1.3)
GFR African American: >60
Glucose: 104 mg/dl (ref 74–106)
Potassium: 4.3 mEq/L (ref 3.5–5.1)
Sodium: 140 mEq/L (ref 136–145)
Total Bilirubin: 1.1 mg/dl — ABNORMAL HIGH (ref 0.2–1.0)
Total Protein: 6.2 gm/dl — ABNORMAL LOW (ref 6.4–8.2)
eGFR NON-AA: >60

## 2018-04-14 LAB — CMV BY PCR, QT
CMV IU/mL: <200 IU/mL (ref <200)
CMV log IU/mL: <2.3 log IU/mL (ref <2.30)

## 2018-04-14 LAB — CMV BY PCR QUANTITATIVE
CMV IU/ML: <200 IU/mL (ref <200)
CMV LOG IU/ML: <2.3 log IU/mL (ref <2.30)

## 2018-04-15 LAB — TACROLIMUS, WHOLE BLOOD
TACROLIMUS,XTACR1: 5.2 mcg/L
Tacrolimus level: 5.2 mcg/L

## 2018-04-16 ENCOUNTER — Inpatient Hospital Stay: Admit: 2018-04-16 | Payer: BLUE CROSS/BLUE SHIELD | Primary: Internal Medicine

## 2018-04-16 DIAGNOSIS — C9100 Acute lymphoblastic leukemia not having achieved remission: Secondary | ICD-10-CM

## 2018-04-16 LAB — CBC WITH AUTOMATED DIFF
BASOPHILS: 0.2 % (ref 0–3)
EOSINOPHILS: 0.6 % (ref 0–5)
HCT: 33.8 % — ABNORMAL LOW (ref 37.0–50.0)
HGB: 11 gm/dl — ABNORMAL LOW (ref 13.0–17.2)
IMMATURE GRANULOCYTES: 0.5 % (ref 0.0–3.0)
LYMPHOCYTES: 6.3 % — ABNORMAL LOW (ref 28–48)
MCH: 34.3 pg (ref 25.4–34.6)
MCHC: 32.5 gm/dl (ref 30.0–36.0)
MCV: 105.3 fL — ABNORMAL HIGH (ref 80.0–98.0)
MONOCYTES: 6.8 % (ref 1–13)
MPV: 9.6 fL (ref 6.0–10.0)
NEUTROPHILS: 85.6 % — ABNORMAL HIGH (ref 34–64)
NRBC: 0 (ref 0–0)
PLATELET: 181 10*3/uL (ref 140–450)
RBC: 3.21 M/uL — ABNORMAL LOW (ref 3.60–5.20)
RDW-SD: 52 — ABNORMAL HIGH (ref 36.4–46.3)
WBC: 8.9 10*3/uL (ref 4.0–11.0)

## 2018-04-16 LAB — METABOLIC PANEL, COMPREHENSIVE
ALT (SGPT): 21 U/L (ref 12–78)
AST (SGOT): 16 U/L (ref 15–37)
Albumin: 2.7 gm/dl — ABNORMAL LOW (ref 3.4–5.0)
Alk. phosphatase: 90 U/L (ref 45–117)
Anion gap: 4 mmol/L — ABNORMAL LOW (ref 5–15)
BUN: 10 mg/dl (ref 7–25)
Bilirubin, total: 0.6 mg/dl (ref 0.2–1.0)
CO2: 28 mEq/L (ref 21–32)
Calcium: 8.7 mg/dl (ref 8.5–10.1)
Chloride: 106 mEq/L (ref 98–107)
Creatinine: 1 mg/dl (ref 0.6–1.3)
GFR est AA: >60
GFR est non-AA: >60
Glucose: 224 mg/dl — ABNORMAL HIGH (ref 74–106)
Potassium: 4.2 mEq/L (ref 3.5–5.1)
Protein, total: 5.6 gm/dl — ABNORMAL LOW (ref 6.4–8.2)
Sodium: 138 mEq/L (ref 136–145)

## 2018-04-16 LAB — MAGNESIUM
Magnesium: 2 mg/dl (ref 1.6–2.6)
Magnesium: 2 mg/dl (ref 1.6–2.6)

## 2018-04-16 LAB — CBC WITH AUTO DIFFERENTIAL
Basophils %: 0.2 % (ref 0–3)
Eosinophils %: 0.6 % (ref 0–5)
Hematocrit: 33.8 % — ABNORMAL LOW (ref 37.0–50.0)
Hemoglobin: 11 gm/dl — ABNORMAL LOW (ref 13.0–17.2)
Immature Granulocytes %: 0.5 % (ref 0.0–3.0)
Lymphocytes %: 6.3 % — ABNORMAL LOW (ref 28–48)
MCH: 34.3 pg (ref 25.4–34.6)
MCHC: 32.5 gm/dl (ref 30.0–36.0)
MCV: 105.3 fL — ABNORMAL HIGH (ref 80.0–98.0)
MPV: 9.6 fL (ref 6.0–10.0)
Monocytes %: 6.8 % (ref 1–13)
Neutrophils %: 85.6 % — ABNORMAL HIGH (ref 34–64)
Nucleated RBCs: 0 (ref 0–0)
Platelets: 181 10*3/uL (ref 140–450)
RBC: 3.21 M/uL — ABNORMAL LOW (ref 3.60–5.20)
RDW-SD: 52 — ABNORMAL HIGH (ref 36.4–46.3)
WBC: 8.9 10*3/uL (ref 4.0–11.0)

## 2018-04-16 LAB — COMPREHENSIVE METABOLIC PANEL
ALT: 21 U/L (ref 12–78)
AST: 16 U/L (ref 15–37)
Albumin: 2.7 gm/dl — ABNORMAL LOW (ref 3.4–5.0)
Alkaline Phosphatase: 90 U/L (ref 45–117)
Anion Gap: 4 mmol/L — ABNORMAL LOW (ref 5–15)
BUN: 10 mg/dl (ref 7–25)
CO2: 28 mEq/L (ref 21–32)
Calcium: 8.7 mg/dl (ref 8.5–10.1)
Chloride: 106 mEq/L (ref 98–107)
Creatinine: 1 mg/dl (ref 0.6–1.3)
GFR African American: >60
Glucose: 224 mg/dl — ABNORMAL HIGH (ref 74–106)
Potassium: 4.2 mEq/L (ref 3.5–5.1)
Sodium: 138 mEq/L (ref 136–145)
Total Bilirubin: 0.6 mg/dl (ref 0.2–1.0)
Total Protein: 5.6 gm/dl — ABNORMAL LOW (ref 6.4–8.2)
eGFR NON-AA: >60

## 2018-04-18 LAB — HEMOGLOBIN A1C
Estimated Avg Glucose, External: 132 mg/dL — ABNORMAL HIGH (ref 91–123)
Hemoglobin A1C, External: 6.2 % — ABNORMAL HIGH (ref 4.8–5.6)

## 2018-04-30 ENCOUNTER — Inpatient Hospital Stay: Admit: 2018-04-30 | Payer: BLUE CROSS/BLUE SHIELD | Primary: Internal Medicine

## 2018-04-30 DIAGNOSIS — C9100 Acute lymphoblastic leukemia not having achieved remission: Secondary | ICD-10-CM

## 2018-04-30 LAB — CBC WITH AUTOMATED DIFF
BASOPHILS: 0.2 % (ref 0–3)
EOSINOPHILS: 1.1 % (ref 0–5)
HCT: 32.1 % — ABNORMAL LOW (ref 37.0–50.0)
HGB: 10.2 gm/dl — ABNORMAL LOW (ref 13.0–17.2)
IMMATURE GRANULOCYTES: 0.8 % (ref 0.0–3.0)
LYMPHOCYTES: 5.2 % — ABNORMAL LOW (ref 28–48)
MCH: 33.7 pg (ref 25.4–34.6)
MCHC: 31.8 gm/dl (ref 30.0–36.0)
MCV: 105.9 fL — ABNORMAL HIGH (ref 80.0–98.0)
MONOCYTES: 6.5 % (ref 1–13)
MPV: 8.8 fL (ref 6.0–10.0)
NEUTROPHILS: 86.2 % — ABNORMAL HIGH (ref 34–64)
NRBC: 0 (ref 0–0)
PLATELET: 195 10*3/uL (ref 140–450)
RBC: 3.03 M/uL — ABNORMAL LOW (ref 3.60–5.20)
RDW-SD: 50.7 — ABNORMAL HIGH (ref 36.4–46.3)
WBC: 10.5 10*3/uL (ref 4.0–11.0)

## 2018-04-30 LAB — METABOLIC PANEL, COMPREHENSIVE
ALT (SGPT): 21 U/L (ref 12–78)
AST (SGOT): 18 U/L (ref 15–37)
Albumin: 2.5 gm/dl — ABNORMAL LOW (ref 3.4–5.0)
Alk. phosphatase: 97 U/L (ref 45–117)
Anion gap: 7 mmol/L (ref 5–15)
BUN: 10 mg/dl (ref 7–25)
Bilirubin, total: 0.5 mg/dl (ref 0.2–1.0)
CO2: 28 mEq/L (ref 21–32)
Calcium: 9.5 mg/dl (ref 8.5–10.1)
Chloride: 104 mEq/L (ref 98–107)
Creatinine: 0.9 mg/dl (ref 0.6–1.3)
GFR est AA: >60
GFR est non-AA: >60
Glucose: 81 mg/dl (ref 74–106)
Potassium: 4.2 mEq/L (ref 3.5–5.1)
Protein, total: 5.7 gm/dl — ABNORMAL LOW (ref 6.4–8.2)
Sodium: 139 mEq/L (ref 136–145)

## 2018-04-30 LAB — MAGNESIUM
Magnesium: 1.5 mg/dl — ABNORMAL LOW (ref 1.6–2.6)
Magnesium: 1.5 mg/dl — ABNORMAL LOW (ref 1.6–2.6)

## 2018-04-30 LAB — CBC WITH AUTO DIFFERENTIAL
Basophils %: 0.2 % (ref 0–3)
Eosinophils %: 1.1 % (ref 0–5)
Hematocrit: 32.1 % — ABNORMAL LOW (ref 37.0–50.0)
Hemoglobin: 10.2 gm/dl — ABNORMAL LOW (ref 13.0–17.2)
Immature Granulocytes: 0.8 % (ref 0.0–3.0)
Lymphocytes %: 5.2 % — ABNORMAL LOW (ref 28–48)
MCH: 33.7 pg (ref 25.4–34.6)
MCHC: 31.8 gm/dl (ref 30.0–36.0)
MCV: 105.9 fL — ABNORMAL HIGH (ref 80.0–98.0)
MPV: 8.8 fL (ref 6.0–10.0)
Monocytes %: 6.5 % (ref 1–13)
Neutrophils %: 86.2 % — ABNORMAL HIGH (ref 34–64)
Nucleated RBCs: 0 (ref 0–0)
Platelets: 195 10*3/uL (ref 140–450)
RBC: 3.03 M/uL — ABNORMAL LOW (ref 3.60–5.20)
RDW-SD: 50.7 — ABNORMAL HIGH (ref 36.4–46.3)
WBC: 10.5 10*3/uL (ref 4.0–11.0)

## 2018-04-30 LAB — COMPREHENSIVE METABOLIC PANEL
ALT: 21 U/L (ref 12–78)
AST: 18 U/L (ref 15–37)
Albumin: 2.5 gm/dl — ABNORMAL LOW (ref 3.4–5.0)
Alkaline Phosphatase: 97 U/L (ref 45–117)
Anion Gap: 7 mmol/L (ref 5–15)
BUN: 10 mg/dl (ref 7–25)
CO2: 28 mEq/L (ref 21–32)
Calcium: 9.5 mg/dl (ref 8.5–10.1)
Chloride: 104 mEq/L (ref 98–107)
Creatinine: 0.9 mg/dl (ref 0.6–1.3)
EGFR IF NonAfrican American: >60
GFR African American: >60
Glucose: 81 mg/dl (ref 74–106)
Potassium: 4.2 mEq/L (ref 3.5–5.1)
Sodium: 139 mEq/L (ref 136–145)
Total Bilirubin: 0.5 mg/dl (ref 0.2–1.0)
Total Protein: 5.7 gm/dl — ABNORMAL LOW (ref 6.4–8.2)

## 2018-12-28 ENCOUNTER — Encounter

## 2019-02-01 NOTE — Progress Notes (Signed)
Ok to take as new pt per Dr. Unknown Jim. Niece of [place].    She will fax over recent labs for current doctor to see if Dr. Unknown Jim needs anything else before appt.

## 2019-02-01 NOTE — Progress Notes (Signed)
Ok to take as new pt per Dr. Unknown Jim. Niece of [place].    She will fax over recent labs for current doctor to see if Dr. Unknown Jim needs anything else before appt.

## 2019-03-05 ENCOUNTER — Inpatient Hospital Stay: Admit: 2019-03-05 | Payer: BLUE CROSS/BLUE SHIELD | Primary: Internal Medicine

## 2019-03-05 ENCOUNTER — Encounter

## 2019-03-05 DIAGNOSIS — Z1231 Encounter for screening mammogram for malignant neoplasm of breast: Secondary | ICD-10-CM

## 2019-03-11 ENCOUNTER — Ambulatory Visit: Attending: Internal Medicine | Primary: Internal Medicine

## 2019-03-18 ENCOUNTER — Ambulatory Visit: Attending: Internal Medicine | Primary: Internal Medicine

## 2019-03-18 ENCOUNTER — Ambulatory Visit
Admit: 2019-03-18 | Discharge: 2019-03-18 | Payer: BLUE CROSS/BLUE SHIELD | Attending: Internal Medicine | Primary: Internal Medicine

## 2019-03-18 DIAGNOSIS — Z0001 Encounter for general adult medical examination with abnormal findings: Secondary | ICD-10-CM

## 2019-03-18 MED ORDER — VARICELLA-ZOSTER GLYCOE VACC-AS01B ADJ(PF) 50 MCG/0.5 ML IM SUSPENSION
50 mcg/0.5 mL | Freq: Once | INTRAMUSCULAR | 1 refills | Status: AC
Start: 2019-03-18 — End: 2019-03-18

## 2019-03-18 NOTE — Progress Notes (Signed)
HPI:   [name] Wu is a 58 y.o. year old female who presents today to establish care. She is the niece of Jeneen Rinks and Eustace Moore. She has a history of acute lymphoblastic leukemia, s/p stem cell transplant, hypertension, hyperlipidemia, diabetes mellitus, peripheral neuropathy, asthma, GERD, insomnia, and osteoarthritis. She reports that she is doing reasonably well. She is continuing to follow with the [facility] in [place] and travels there every 3 months for exam. She also has labs performed monthly locally at [facility]. She is currently without specific complaints.    She has a history of acute lymphoblastic leukemia, diagnosed in 02/2017 after experiencing severe fatigue and bone pain for several months. She was positive for the [place] chromosome mutation. She presented to the [facility] in [place] for care, and underwent chemotherapy which achieved remission. She subsequently underwent a stem cell transplant on 08/14/2017, felt to be curative. She is now on maintenance therapy with a tyrosine kinase inhibitor, nilotimib 200 mg bid. She is followed by Dr. Noemi Chapel , transplant oncologist, and travels to [facility] every three months for care as discussed. She is also followed locally by Dr. Edd Arbour.     He has a history of hypertension, treated with amlodipine. She has a history of hyperlipidemia, and was previously taking atorvastatin. However this was discontinued during her cancer treatment. She also has a history of diabetes mellitus, currently being treated with [place] and Humalog. She denies any polyuria, polydipsia, nocturia, or blurry vision, and has no history of retinopathy or nephropathy. 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 She is currently under the care of pain management and is being treated with Cymbalta and Roxicodone as needed.     She has a history of asthma since childhood,  and is being maintained on Flovent and albuterol as needed. She denies any cough or shortness of breath.     In 04/2018, she was admitted to [facility] from 12/4-12/10/2017 for a UTI and then subsequently readmitted from 12/6-12/17/2019 with MRSE bacteremia related to a PICC line and bibasilar pneumonia.     He has a history of GERD, treated with Protonix. She also underwent a screening colonoscopy and 05/2018 in [place]. Report unavailable. She denies any abdominal pain, nausea, vomiting, melena, hematochezia, or change in bowel movements.      Past Medical History:   Diagnosis Date   ??? ALL (acute lymphoblastic leukemia) ([place]) 02/2017    s/p stem cell transplant 08/2017; [facility] in [place]   ??? Diabetes ([place])    ??? GERD (gastroesophageal reflux disease) 03/18/2019   ??? 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 H/O stem cell transplant ([place]) 041/04/19   ??? Hyperlipidemia 03/22/2019   ??? Hypertension    ??? Insomnia 03/22/2019   ??? Mild intermittent asthma without complication 69/08/8544   ??? Peripheral neuropathy due to chemotherapy ([place]) 03/18/2019     No past surgical history on file.  Current Outpatient Medications   Medication Sig   ??? atorvastatin (LIPITOR) 20 mg tablet Take 1 Tab by mouth daily.   ??? levoFLOXacin (LEVAQUIN) 250 mg tablet Take 1 Tab by mouth daily for 7 days.   ??? BIOTIN PO Take 1 Tab by mouth daily.   ??? calcium carb/vit D3/minerals (CALTRATE 600+D PLUS MINERALS PO) Take  by mouth daily.   ??? amLODIPine (NORVASC) 5 mg tablet TAKE 1 TABLET BY  MOUTH EVERY DAY   ??? OneTouch Ultra Blue Test Strip strip TEST 4 TIMES A DAY   ??? fluticasone propionate (Flovent HFA) 220 mcg/actuation inhaler Take 2 Puffs by inhalation two (2) times daily as needed.   ??? Nano Pen Needle 32 gauge x 5/32" ndle INJECT 4 TIMES A DAY WITH MEALS AND AT BEDTIME   ??? triamcinolone acetonide (KENALOG) 0.1 % topical cream APPLY TO AFFECTED AREA TWICE A DAY AS NEEDED FOR RASH   ??? levocetirizine dihydrochloride  (XYZAL PO) Take  by mouth.   ??? RESVERATROL-QUERCETIN PO Take  by mouth.   ??? albuterol (PROVENTIL VENTOLIN) 2.5 mg /3 mL (0.083 %) nebu 3 mL by Nebulization route every four (4) hours as needed (wheezing).   ??? magnesium oxide (MAG-OX) 400 mg tablet Take 800 mg by mouth two (2) times a day.   ??? prochlorperazine (COMPAZINE) 10 mg tablet Take 10 mg by mouth every four (4) hours as needed.   ??? nilotinib (TASIGNA) 200 mg cap capsule Take 200 mg by mouth two (2) times a day.   ??? cholecalciferol, VITAMIN D3, (VITAMIN D3) 5,000 unit tab tablet Take 5,000 Units by mouth daily.   ??? oxyCODONE IR (ROXICODONE) 5 mg immediate release tablet Take 2 Tabs by mouth every four (4) hours as needed. Max Daily Amount: 60 mg.   ??? acyclovir (ZOVIRAX) 400 mg tablet Take 800 mg by mouth two (2) times a day.   ??? insulin lispro (HUMALOG JUNIOR KWIKPEN U-100) 100 unit/mL inph by SubCUTAneous route three (3) times daily. 180-200 2 U; 201-220 4 U; 221-240 5 U; 241-280 6 U; 281-320 7 U; 321-400 8 U;  >400 8 U and call  Indications: sliding scale   ??? potassium chloride SR (K-TAB) 20 mEq tablet Take 40 mEq by mouth daily.   ??? insulin degludec (TRESIBA FLEXTOUCH U-200) 200 unit/mL (3 mL) inpn 30 Units by SubCUTAneous route daily.   ??? zolpidem (AMBIEN) 5 mg tablet Take 5 mg by mouth nightly.   ??? DULoxetine (CYMBALTA) 30 mg capsule TAKE 1 CAPSULE BY MOUTH TWICE A DAY   ??? pantoprazole (Protonix) 40 mg tablet Take 40 mg by mouth two (2) times a day.   ??? Magnesium Oxide-Mg AA Chelate (MG-PLUS) 133 mg tab Take 1 Tab by mouth two (2) times a day.     No current facility-administered medications for this visit.      Allergies and Intolerances:   Allergies   Allergen Reactions   ??? Pcn [Penicillins] Hives and Itching     Family History: She has no family history of colon cancer of leukemia. Mother had brest cancer at the age of 61.   Family History   Problem Relation Age of Onset   ??? Breast Cancer Mother      Social History:   She  reports that she has never  smoked. She has never used smokeless tobacco. She is divorced and has no children. She is a Optometrist, and working virtually due to the pandemic.   Social History     Substance and Sexual Activity   Alcohol Use No   ??? Frequency: Never     Immunization History:  Immunization History   Administered Date(s) Administered   ??? Influenza Vaccine 02/27/2018, 02/23/2019   ??? Pneumococcal Polysaccharide (PPSV-23) 03/18/2019   ??? Tdap 03/18/2019       Review of Systems:   As above included in HPI.  Otherwise 11 point review of systems negative including constitutional, skin, HENT, eyes, respiratory,  cardiovascular, gastrointestinal, genitourinary, musculoskeletal, endocrine, hematologic, allergy, and neurologic.    Physical:   Vitals:   BP: 123/78  HR: 75  WT: 194 lb (88 kg)  BMI:  29.50 kg/m2      Exam:   Patient appears in no apparent distress. Affect is appropriate.  HEENT --Anicteric sclerae, tympanic membranes normal,  ear canals normal.  PERRL, EOMI, conjunctiva and lids normal.  No cervical lymphadenopathy.  No thyromegaly, JVD, or bruits.  Carotid pulses 2+ with normal upstroke.  Lungs --Clear to auscultation. No wheezing or rales.  Heart --Regular rate and rhythm, no murmurs, rubs, gallops, or clicks.  Abdomen -- Soft and nontender, no hepatosplenomegaly or masses.  Extremities -- Without cyanosis, clubbing, edema. Normal looking digits, ROM intact  Neuro -- CN 2-12 intact, strength 5/5 with intact soft touch in all extremities  Derm ??? no obvious abnormalities noted, no rash    Diabetic foot exam:     Left Foot:   Visual Exam: normal    Pulse DP: 2+ (normal)   Filament test: normal sensation    Vibratory sensation: normal      Right Foot:   Visual Exam: normal    Pulse DP: 2+ (normal)   Filament test: normal sensation    Vibratory sensation: normal          Review of Data:  Labs:  Hospital Outpatient Visit on 03/18/2019   Component Date Value Ref Range Status   ??? WBC 03/18/2019 8.8  4.6 - 13.2 K/uL Final   ??? RBC  03/18/2019 4.48  4.20 - 5.30 M/uL Final   ??? HGB 03/18/2019 14.5  12.0 - 16.0 g/dL Final   ??? HCT 03/18/2019 42.9  35.0 - 45.0 % Final   ??? MCV 03/18/2019 95.8  74.0 - 97.0 FL Final   ??? MCH 03/18/2019 32.4  24.0 - 34.0 PG Final   ??? MCHC 03/18/2019 33.8  31.0 - 37.0 g/dL Final   ??? RDW 03/18/2019 13.7  11.6 - 14.5 % Final   ??? PLATELET 03/18/2019 257  135 - 420 K/uL Final   ??? MPV 03/18/2019 9.8  9.2 - 11.8 FL Final   ??? NEUTROPHILS 03/18/2019 65  40 - 73 % Final   ??? LYMPHOCYTES 03/18/2019 27  21 - 52 % Final   ??? MONOCYTES 03/18/2019 5  3 - 10 % Final   ??? EOSINOPHILS 03/18/2019 2  0 - 5 % Final   ??? BASOPHILS 03/18/2019 1  0 - 2 % Final   ??? ABS. NEUTROPHILS 03/18/2019 5.8  1.8 - 8.0 K/UL Final   ??? ABS. LYMPHOCYTES 03/18/2019 2.4  0.9 - 3.6 K/UL Final   ??? ABS. MONOCYTES 03/18/2019 0.4  0.05 - 1.2 K/UL Final   ??? ABS. EOSINOPHILS 03/18/2019 0.2  0.0 - 0.4 K/UL Final   ??? ABS. BASOPHILS 03/18/2019 0.0  0.0 - 0.1 K/UL Final   ??? DF 03/18/2019 AUTOMATED    Final   ??? Hemoglobin A1c 03/18/2019 6.5* 4.2 - 5.6 % Final   ??? Est. average glucose 03/18/2019 140  mg/dL Final   ??? LIPID PROFILE 03/18/2019        Final   ??? Cholesterol, total 03/18/2019 277* <200 MG/DL Final   ??? Triglyceride 03/18/2019 238* <150 MG/DL Final   ??? HDL Cholesterol 03/18/2019 69* 40 - 60 MG/DL Final   ??? LDL, calculated 03/18/2019 160.4* 0 - 100 MG/DL Final   ??? VLDL, calculated 03/18/2019 47.6  MG/DL Final   ??? CHOL/HDL Ratio 03/18/2019  4.0  0 - 5.0   Final   ??? Magnesium 03/18/2019 2.1  1.6 - 2.6 mg/dL Final   ??? Sodium 03/18/2019 142  136 - 145 mmol/L Final   ??? Potassium 03/18/2019 3.9  3.5 - 5.5 mmol/L Final   ??? Chloride 03/18/2019 106  100 - 111 mmol/L Final   ??? CO2 03/18/2019 29  21 - 32 mmol/L Final   ??? Anion gap 03/18/2019 7  3.0 - 18 mmol/L Final   ??? Glucose 03/18/2019 141* 74 - 99 mg/dL Final   ??? BUN 03/18/2019 12  7.0 - 18 MG/DL Final   ??? Creatinine 03/18/2019 1.03  0.6 - 1.3 MG/DL Final   ??? BUN/Creatinine ratio 03/18/2019 12  12 - 20   Final   ??? GFR est AA  03/18/2019 >60  >60 ml/min/1.70m Final   ??? GFR est non-AA 03/18/2019 55* >60 ml/min/1.766mFinal   ??? Calcium 03/18/2019 9.3  8.5 - 10.1 MG/DL Final   ??? Bilirubin, total 03/18/2019 0.6  0.2 - 1.0 MG/DL Final   ??? ALT (SGPT) 03/18/2019 33  13 - 56 U/L Final   ??? AST (SGOT) 03/18/2019 22  10 - 38 U/L Final   ??? Alk. phosphatase 03/18/2019 181* 45 - 117 U/L Final   ??? Protein, total 03/18/2019 7.0  6.4 - 8.2 g/dL Final   ??? Albumin 03/18/2019 3.9  3.4 - 5.0 g/dL Final   ??? Globulin 03/18/2019 3.1  2.0 - 4.0 g/dL Final   ??? A-G Ratio 03/18/2019 1.3  0.8 - 1.7   Final   ??? Microalbumin,urine random 03/18/2019 3.17* 0 - 3.0 MG/DL Final   ??? Creatinine, urine 03/18/2019 175.00* 30 - 125 mg/dL Final   ??? Microalbumin/Creat ratio (mg/g cre* 03/18/2019 18  0 - 30 mg/g Final   ??? TSH 03/18/2019 1.38  0.36 - 3.74 uIU/mL Final   ??? Vitamin D 25-Hydroxy 03/18/2019 42.8  30 - 100 ng/mL Final   ??? Hepatitis C virus Ab 03/18/2019 0.12  <0.80 Index Final   ??? Hep C virus Ab Interp. 03/18/2019 Negative  NEG   Final   ??? Hep C  virus Ab comment 03/18/2019        Final   ??? Color 03/18/2019 YELLOW    Final   ??? Appearance 03/18/2019 CLOUDY    Final   ??? Specific gravity 03/18/2019 1.018  1.005 - 1.030   Final   ??? pH (UA) 03/18/2019 7.5  5.0 - 8.0   Final   ??? Protein 03/18/2019 TRACE* NEG mg/dL Final   ??? Glucose 03/18/2019 Negative  NEG mg/dL Final   ??? Ketone 03/18/2019 Negative  NEG mg/dL Final   ??? Bilirubin 03/18/2019 Negative  NEG   Final   ??? Blood 03/18/2019 Negative  NEG   Final   ??? Urobilinogen 03/18/2019 0.2  0.2 - 1.0 EU/dL Final   ??? Nitrites 03/18/2019 Negative  NEG   Final   ??? Leukocyte Esterase 03/18/2019 LARGE* NEG   Final   ??? WBC 03/18/2019 20 to 30  0 - 4 /hpf Final   ??? RBC 03/18/2019 NONE  0 - 5 /hpf Final   ??? Epithelial cells 03/18/2019 3+  0 - 5 /lpf Final   ??? Bacteria 03/18/2019 1+* NEG /hpf Final       Calculated 10 year ASCVD risk score: 13.9 %    Health Maintenance:  Screening:    Mammogram: abnormal (03/05/2019). Needs additional  views.   PAP smear: unknown   Colorectal: colonoscopy (  05/2018) in [place].    Depression: none   DM (HbA1c/FPG): HbA1c 6.5 (03/2019)   Hepatitis C: negative (03/2019)   Falls: none   DEXA: unknown   Glaucoma: unknown   Smoking: none   Vitamin D: 42.8 (03/2019)   Medicare Wellness: n/a        Impression:  Patient Active Problem List   Diagnosis Code   ??? 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[place]) C91.01   ??? S/P bone marrow transplant ([place]) Z94.81   ??? Essential hypertension I10   ??? Type 2 diabetes mellitus, with long-term current use of insulin (H[facility]) E11.9, Z79.4   ??? Peripheral neuropathy due to chemotherapy ([place]) G62.0, T45.1X5A   ??? Mild intermittent asthma without complication Z30.86   ??? GERD (gastroesophageal reflux disease) K21.9   ??? Eczema L30.9   ??? Insomnia G47.00   ??? 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 Hyperlipidemia E78.5   ??? Abnormality of right breast on screening mammogram R92.8   ??? Overweight (BMI 25.0-29.9) E66.3   ??? Elevated alkaline phosphatase level R74.8       Plan:  1. Acute lymphoblastic leukemia, +[place] chrom mutation, s/p stem cell transplant. Diagnosed in 02/2017 and underwent induction chemotherapy followed by stem cell transplant in 08/2017. Now on maintenance therapy with a TKI (nilotinib). Being followed closely by Dr. Noemi Chapel, transplant oncologist, at [facility] in [place] with monthly blood draws and travel to [place] every 3 months for evaluation. Has also been followed locally by Dr. Edd Arbour.   2. Hypertension. Blood pressure well controlled on amlodipine 5 mg daily. Renal function remains normal with creatinine 1.03/ eGFR >60. Will continue to monitor. On potassium and magnesium supplements due to long term difficulty with deficiency. Stable levels today. Will monitor.   3. Hyperlipidemia. Calculated 10 year ASCVD risk is 13.9 %, which places her in one of the four statin benefit groups as per new AHA/ACC guidelines (primary  prevention: 10 year ASCVD risk >7.5%). Patient also a diabetic. Agreeable to restart treatment with atorvastatin 20 mg daily.  Emphasized importance of lifestyle modifications, including diet, exercise, and weight loss. Will recheck lipid panel at next visit. Continue to follow.  4. Diabetes mellitus. Well controlled on current regimen of Tresiba and SS Humalog with HbA1c 6.5. No episodes of hypoglycemia. No evidence of microvascular complications.  On statin but not on Ace-I. Will refer to Dr. Faustino Congress for annual eye exams. Foot exam grossly normal today, and no evidence of microalbuminuria with urine microalbumin/ creatinine ratio 18 mg/g. Will consider addition of Ace-I/ARB if blood pressure will allow.   5. Peripheral neuropathy, painful. Thought to be secondary to chemotherapy. On Cymbalta 30 mg bid and Roxicodone prn through pain management. Did not tolerate gabapentin. Follow.   6. Asthma, mild intermittent. On Flovent prn and albuterol as needed. Will use when symptoms increase. Currently stable.  7. GERD. On Protonix 40 mg bid with reasonably good control. Follow.  8. Abnormal mammogram. Patient reports had previous biopsy of right breast which was benign. Repeat mammogram showing multiple abnormalities in right breast. Will proceed with additional compression views +/- ultrasound if needed. Patient with FH history of breast cancer in her mother.  9. Abnormal urinalysis. Patient describing some slow urination and pressure in pelvic region. Will obtain repeat with culture, and prescribe empiric Levaquin 250 mg daily x 7 days until results known.   10. Elevated alkaline phosphatase. Unclear if related to transplant and bone marrow process. Will request  records to see if has been elevated previously. Will check GGT at next visit.   11. Insomnia. Patient using Ambien +/- Benadryl as needed. Will emphasize good sleep hygiene.   12. Eczema. On Xyzal and will use Kenalog as needed.   13. Overweight. Will  emphasize importance of lifestyle modifications, including diet, exercise, and weight loss.  14. Health maintenance. Already received influenza vaccine. Will give Pneumovax 23 and Tdap today. Will give script for Shingrix vaccine. Mammogram as discussed. Will request colonoscopy report. Will refer to Dr. Charlyne Mom for well woman exam and pap smear. Will refer to Dr. Faustino Congress for eye exam. Vitamin D level normal. Continue maintenance dose supplement. Hepatitis C negative. Emphasized importance of lifestyle modifications, including diet, exercise, and weight loss.    Total time: 60 minutes spent with the patient on counseling, answering questions and/or coordination of care. Complex medical review performed, including review of medical history, lab results, and testing. Complicated management plan formulated.    Patient understands recommendations and agrees with plan.  Follow-up in 3 months.

## 2019-03-18 NOTE — Patient Instructions (Signed)
Learning About Meal Planning for Diabetes  Why plan your meals?     Meal planning can be a key part of managing diabetes. Planning meals and snacks with the right balance of carbohydrate, protein, and fat can help you keep your blood sugar at the target level you set with your doctor.  You don't have to eat special foods. You can eat what your family eats, including sweets once in a while. But you do have to pay attention to how often you eat and how much you eat of certain foods.  You may want to work with a dietitian or a certified diabetes educator. He or she can give you tips and meal ideas and can answer your questions about meal planning. This health professional can also help you reach a healthy weight if that is one of your goals.  What plan is right for you?  Your dietitian or diabetes educator may suggest that you start with the plate format or carbohydrate counting.  The plate format  The plate format is a simple way to help you manage how you eat. You plan meals by learning how much space each food should take on a plate. Using the plate format helps you spread carbohydrate throughout the day. It can make it easier to keep your blood sugar level within your target range. It also helps you see if you're eating healthy portion sizes.  To use the plate format, you put non-starchy vegetables on half your plate. Add meat or meat substitutes on one-quarter of the plate. Put a grain or starchy vegetable (such as brown rice or a potato) on the final quarter of the plate. You can add a small piece of fruit and some low-fat or fat-free milk or yogurt, depending on your carbohydrate goal for each meal.  Here are some tips for using the plate format:  ?? Make sure that you are not using an oversized plate. A 9-inch plate is best. Many restaurants use larger plates.  ?? Get used to using the plate format at home. Then you can use it when you eat out.   ?? Write down your questions about using the plate format. Talk to your doctor, a dietitian, or a diabetes educator about your concerns.  Carbohydrate counting  With carbohydrate counting, you plan meals based on the amount of carbohydrate in each food. Carbohydrate raises blood sugar higher and more quickly than any other nutrient. It is found in desserts, breads and cereals, and fruit. It's also found in starchy vegetables such as potatoes and corn, grains such as rice and pasta, and milk and yogurt. Spreading carbohydrate throughout the day helps keep your blood sugar levels within your target range.  Your daily amount depends on several things, including your weight, how active you are, which diabetes medicines you take, and what your goals are for your blood sugar levels. A registered dietitian or diabetes educator can help you plan how much carbohydrate to include in each meal and snack.  A guideline for your daily amount of carbohydrate is:  ?? 45 to 60 grams at each meal. That's about the same as 3 to 4 carbohydrate servings.  ?? 15 to 20 grams at each snack. That's about the same as 1 carbohydrate serving.  The Nutrition Facts label on packaged foods tells you how much carbohydrate is in a serving of the food. First, look at the serving size on the food label. Is that the amount you eat in a serving? All of   the nutrition information on a food label is based on that serving size. So if you eat more or less than that, you'll need to adjust the other numbers. Total carbohydrate is the next thing you need to look for on the label. If you count carbohydrate servings, one serving of carbohydrate is 15 grams.  For foods that don't come with labels, such as fresh fruits and vegetables, you'll need a guide that lists carbohydrate in these foods. Ask your doctor, dietitian, or diabetes educator about books or other nutrition guides you can use.  If you take insulin, you need to know how many grams of carbohydrate are  in a meal. This lets you know how much rapid-acting insulin to take before you eat. If you use an insulin pump, you get a constant rate of insulin during the day. So the pump must be programmed at meals to give you extra insulin to cover the rise in blood sugar after meals.  When you know how much carbohydrate you will eat, you can take the right amount of insulin. Or, if you always use the same amount of insulin, you need to make sure that you eat the same amount of carbohydrate at meals.  If you need more help to understand carbohydrate counting and food labels, ask your doctor, dietitian, or diabetes educator.  How do you get started with meal planning?  Here are some tips to get started:  ?? Plan your meals a week at a time. Don't forget to include snacks too.  ?? Use cookbooks or online recipes to plan several main meals. Plan some quick meals for busy nights. You also can double some recipes that freeze well. Then you can save half for other busy nights when you don't have time to cook.  ?? Make sure you have the ingredients you need for your recipes. If you're running low on basic items, put these items on your shopping list too.  ?? List foods that you use to make breakfasts, lunches, and snacks. List plenty of fruits and vegetables.  ?? Post this list on the refrigerator. Add to it as you think of more things you need.  ?? Take the list to the store to do your weekly shopping.  Follow-up care is a key part of your treatment and safety. Be sure to make and go to all appointments, and call your doctor if you are having problems. It's also a good idea to know your test results and keep a list of the medicines you take.  Where can you learn more?  Go to https://www.healthwise.net/[facility]  Enter X936 in the search box to learn more about "Learning About Meal Planning for Diabetes."  Current as of: May 01, 2018??????????????????????????????Content Version: 12.6  ?? 2006-2020 [employer]wise[employer].    Care instructions adapted under license by [employer] (which disclaims liability or warranty for this information). If you have questions about a medical condition or this instruction, always ask your healthcare professional. Healthwise, Incorporated disclaims any warranty or liability for your use of this information.

## 2019-03-18 NOTE — Progress Notes (Signed)
HPI:   [name] Wu is a 58 y.o. year old female who presents today to establish care. She is the niece of [name] and [name] Wu. She has a history of acute lymphoblastic leukemia, s/p stem cell transplant, hypertension, hyperlipidemia, diabetes mellitus, peripheral neuropathy, asthma, GERD, insomnia, and osteoarthritis. She reports that she is doing reasonably well. She is continuing to follow with the [facility] in [place] and travels there every 3 months for exam. She also has labs performed monthly locally at [facility]. She is currently without specific complaints.    She has a history of acute lymphoblastic leukemia, diagnosed in 02/2017 after experiencing severe fatigue and bone pain for several months. She was positive for the Philadelphia chromosome mutation. She presented to the [facility] in [place] for care, and underwent chemotherapy which achieved remission. She subsequently underwent a stem cell transplant on 08/14/2017, felt to be curative. She is now on maintenance therapy with a tyrosine kinase inhibitor, nilotimib 200 mg bid. She is followed by Dr. Istvan Redei , transplant oncologist, and travels to [place] every three months for care as discussed. She is also followed locally by Dr. Tan.     He has a history of hypertension, treated with amlodipine. She has a history of hyperlipidemia, and was previously taking atorvastatin. However this was discontinued during her cancer treatment. She also has a history of diabetes mellitus, currently being treated with Tresiba and Humalog. She denies any polyuria, polydipsia, nocturia, or blurry vision, and has no history of retinopathy or nephropathy. 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 She is currently under the care of pain management and is being treated with Cymbalta and Roxicodone as needed.     She has a history of asthma since childhood,  and is being maintained on Flovent and albuterol as needed. She denies any cough or shortness of breath.     In 04/2018, she was admitted to [facility] from 12/4-12/10/2017 for a UTI and then subsequently readmitted from 12/6-12/17/2019 with MRSE bacteremia related to a PICC line and bibasilar pneumonia.     He has a history of GERD, treated with Protonix. She also underwent a screening colonoscopy and 05/2018 in [place]. Report unavailable. She denies any abdominal pain, nausea, vomiting, melena, hematochezia, or change in bowel movements.      Past Medical History:   Diagnosis Date   ??? ALL (acute lymphoblastic leukemia) (HCC) 02/2017    s/p stem cell transplant 08/2017; [facility] in [place]   ??? Diabetes (HCC)    ??? GERD (gastroesophageal reflux disease) 03/18/2019   ??? 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 H/O stem cell transplant (HCC) 041/04/19   ??? Hyperlipidemia 03/22/2019   ??? Hypertension    ??? Insomnia 03/22/2019   ??? Mild intermittent asthma without complication 03/18/2019   ??? 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  Current Outpatient Medications   Medication Sig   ??? atorvastatin (LIPITOR) 20 mg tablet Take 1 Tab by mouth daily.   ??? levoFLOXacin (LEVAQUIN) 250 mg tablet Take 1 Tab by mouth daily for 7 days.   ??? BIOTIN PO Take 1 Tab by mouth daily.   ??? calcium carb/vit D3/minerals (CALTRATE 600+D PLUS MINERALS PO) Take  by mouth daily.   ??? amLODIPine (NORVASC) 5 mg tablet TAKE 1 TABLET BY   MOUTH EVERY DAY   ??? OneTouch Ultra Blue Test Strip strip TEST 4 TIMES A DAY   ??? fluticasone propionate (Flovent HFA) 220 mcg/actuation inhaler Take 2 Puffs by inhalation two (2) times daily as needed.   ??? Nano Pen Needle 32 gauge x 5/32" ndle INJECT 4 TIMES A DAY WITH MEALS AND AT BEDTIME   ??? triamcinolone acetonide (KENALOG) 0.1 % topical cream APPLY TO AFFECTED AREA TWICE A DAY AS NEEDED FOR RASH   ??? levocetirizine dihydrochloride  (XYZAL PO) Take  by mouth.   ??? RESVERATROL-QUERCETIN PO Take  by mouth.   ??? albuterol (PROVENTIL VENTOLIN) 2.5 mg /3 mL (0.083 %) nebu 3 mL by Nebulization route every four (4) hours as needed (wheezing).   ??? magnesium oxide (MAG-OX) 400 mg tablet Take 800 mg by mouth two (2) times a day.   ??? prochlorperazine (COMPAZINE) 10 mg tablet Take 10 mg by mouth every four (4) hours as needed.   ??? nilotinib (TASIGNA) 200 mg cap capsule Take 200 mg by mouth two (2) times a day.   ??? cholecalciferol, VITAMIN D3, (VITAMIN D3) 5,000 unit tab tablet Take 5,000 Units by mouth daily.   ??? oxyCODONE IR (ROXICODONE) 5 mg immediate release tablet Take 2 Tabs by mouth every four (4) hours as needed. Max Daily Amount: 60 mg.   ??? acyclovir (ZOVIRAX) 400 mg tablet Take 800 mg by mouth two (2) times a day.   ??? insulin lispro (HUMALOG JUNIOR KWIKPEN U-100) 100 unit/mL inph by SubCUTAneous route three (3) times daily. 180-200 2 U; 201-220 4 U; 221-240 5 U; 241-280 6 U; 281-320 7 U; 321-400 8 U;  >400 8 U and call  Indications: sliding scale   ??? potassium chloride SR (K-TAB) 20 mEq tablet Take 40 mEq by mouth daily.   ??? insulin degludec (TRESIBA FLEXTOUCH U-200) 200 unit/mL (3 mL) inpn 30 Units by SubCUTAneous route daily.   ??? zolpidem (AMBIEN) 5 mg tablet Take 5 mg by mouth nightly.   ??? DULoxetine (CYMBALTA) 30 mg capsule TAKE 1 CAPSULE BY MOUTH TWICE A DAY   ??? pantoprazole (Protonix) 40 mg tablet Take 40 mg by mouth two (2) times a day.   ??? Magnesium Oxide-Mg AA Chelate (MG-PLUS) 133 mg tab Take 1 Tab by mouth two (2) times a day.     No current facility-administered medications for this visit.      Allergies and Intolerances:   Allergies   Allergen Reactions   ??? Pcn [Penicillins] Hives and Itching     Family History: She has no family history of colon cancer of leukemia. Mother had brest cancer at the age of 68.   Family History   Problem Relation Age of Onset   ??? Breast Cancer Mother      Social History:   She  reports that she has never  smoked. She has never used smokeless tobacco. She is divorced and has no children. She is a consultant, and working virtually due to the pandemic.   Social History     Substance and Sexual Activity   Alcohol Use No   ??? Frequency: Never     Immunization History:  Immunization History   Administered Date(s) Administered   ??? Influenza Vaccine 02/27/2018, 02/23/2019   ??? Pneumococcal Polysaccharide (PPSV-23) 03/18/2019   ??? Tdap 03/18/2019       Review of Systems:   As above included in HPI.  Otherwise 11 point review of systems negative including constitutional, skin, HENT, eyes, respiratory,   cardiovascular, gastrointestinal, genitourinary, musculoskeletal, endocrine, hematologic, allergy, and neurologic.    Physical:   Vitals:   BP: 123/78  HR: 75  WT: 194 lb (88 kg)  BMI:  29.50 kg/m2      Exam:   Patient appears in no apparent distress. Affect is appropriate.  HEENT --Anicteric sclerae, tympanic membranes normal,  ear canals normal.  PERRL, EOMI, conjunctiva and lids normal.  No cervical lymphadenopathy.  No thyromegaly, JVD, or bruits.  Carotid pulses 2+ with normal upstroke.  Lungs --Clear to auscultation. No wheezing or rales.  Heart --Regular rate and rhythm, no murmurs, rubs, gallops, or clicks.  Abdomen -- Soft and nontender, no hepatosplenomegaly or masses.  Extremities -- Without cyanosis, clubbing, edema. Normal looking digits, ROM intact  Neuro -- CN 2-12 intact, strength 5/5 with intact soft touch in all extremities  Derm ??? no obvious abnormalities noted, no rash    Diabetic foot exam:     Left Foot:   Visual Exam: normal    Pulse DP: 2+ (normal)   Filament test: normal sensation    Vibratory sensation: normal      Right Foot:   Visual Exam: normal    Pulse DP: 2+ (normal)   Filament test: normal sensation    Vibratory sensation: normal          Review of Data:  Labs:  Hospital Outpatient Visit on 03/18/2019   Component Date Value Ref Range Status   ??? WBC 03/18/2019 8.8  4.6 - 13.2 K/uL Final   ??? RBC  03/18/2019 4.48  4.20 - 5.30 M/uL Final   ??? HGB 03/18/2019 14.5  12.0 - 16.0 g/dL Final   ??? HCT 03/18/2019 42.9  35.0 - 45.0 % Final   ??? MCV 03/18/2019 95.8  74.0 - 97.0 FL Final   ??? MCH 03/18/2019 32.4  24.0 - 34.0 PG Final   ??? MCHC 03/18/2019 33.8  31.0 - 37.0 g/dL Final   ??? RDW 03/18/2019 13.7  11.6 - 14.5 % Final   ??? PLATELET 03/18/2019 257  135 - 420 K/uL Final   ??? MPV 03/18/2019 9.8  9.2 - 11.8 FL Final   ??? NEUTROPHILS 03/18/2019 65  40 - 73 % Final   ??? LYMPHOCYTES 03/18/2019 27  21 - 52 % Final   ??? MONOCYTES 03/18/2019 5  3 - 10 % Final   ??? EOSINOPHILS 03/18/2019 2  0 - 5 % Final   ??? BASOPHILS 03/18/2019 1  0 - 2 % Final   ??? ABS. NEUTROPHILS 03/18/2019 5.8  1.8 - 8.0 K/UL Final   ??? ABS. LYMPHOCYTES 03/18/2019 2.4  0.9 - 3.6 K/UL Final   ??? ABS. MONOCYTES 03/18/2019 0.4  0.05 - 1.2 K/UL Final   ??? ABS. EOSINOPHILS 03/18/2019 0.2  0.0 - 0.4 K/UL Final   ??? ABS. BASOPHILS 03/18/2019 0.0  0.0 - 0.1 K/UL Final   ??? DF 03/18/2019 AUTOMATED    Final   ??? Hemoglobin A1c 03/18/2019 6.5* 4.2 - 5.6 % Final   ??? Est. average glucose 03/18/2019 140  mg/dL Final   ??? LIPID PROFILE 03/18/2019        Final   ??? Cholesterol, total 03/18/2019 277* <200 MG/DL Final   ??? Triglyceride 03/18/2019 238* <150 MG/DL Final   ??? HDL Cholesterol 03/18/2019 69* 40 - 60 MG/DL Final   ??? LDL, calculated 03/18/2019 160.4* 0 - 100 MG/DL Final   ??? VLDL, calculated 03/18/2019 47.6  MG/DL Final   ??? CHOL/HDL Ratio 03/18/2019   4.0  0 - 5.0   Final   ??? Magnesium 03/18/2019 2.1  1.6 - 2.6 mg/dL Final   ??? Sodium 03/18/2019 142  136 - 145 mmol/L Final   ??? Potassium 03/18/2019 3.9  3.5 - 5.5 mmol/L Final   ??? Chloride 03/18/2019 106  100 - 111 mmol/L Final   ??? CO2 03/18/2019 29  21 - 32 mmol/L Final   ??? Anion gap 03/18/2019 7  3.0 - 18 mmol/L Final   ??? Glucose 03/18/2019 141* 74 - 99 mg/dL Final   ??? BUN 03/18/2019 12  7.0 - 18 MG/DL Final   ??? Creatinine 03/18/2019 1.03  0.6 - 1.3 MG/DL Final   ??? BUN/Creatinine ratio 03/18/2019 12  12 - 20   Final   ??? GFR est AA  03/18/2019 >60  >60 ml/min/1.73m2 Final   ??? GFR est non-AA 03/18/2019 55* >60 ml/min/1.73m2 Final   ??? Calcium 03/18/2019 9.3  8.5 - 10.1 MG/DL Final   ??? Bilirubin, total 03/18/2019 0.6  0.2 - 1.0 MG/DL Final   ??? ALT (SGPT) 03/18/2019 33  13 - 56 U/L Final   ??? AST (SGOT) 03/18/2019 22  10 - 38 U/L Final   ??? Alk. phosphatase 03/18/2019 181* 45 - 117 U/L Final   ??? Protein, total 03/18/2019 7.0  6.4 - 8.2 g/dL Final   ??? Albumin 03/18/2019 3.9  3.4 - 5.0 g/dL Final   ??? Globulin 03/18/2019 3.1  2.0 - 4.0 g/dL Final   ??? A-G Ratio 03/18/2019 1.3  0.8 - 1.7   Final   ??? Microalbumin,urine random 03/18/2019 3.17* 0 - 3.0 MG/DL Final   ??? Creatinine, urine 03/18/2019 175.00* 30 - 125 mg/dL Final   ??? Microalbumin/Creat ratio (mg/g cre* 03/18/2019 18  0 - 30 mg/g Final   ??? TSH 03/18/2019 1.38  0.36 - 3.74 uIU/mL Final   ??? Vitamin D 25-Hydroxy 03/18/2019 42.8  30 - 100 ng/mL Final   ??? Hepatitis C virus Ab 03/18/2019 0.12  <0.80 Index Final   ??? Hep C virus Ab Interp. 03/18/2019 Negative  NEG   Final   ??? Hep C  virus Ab comment 03/18/2019        Final   ??? Color 03/18/2019 YELLOW    Final   ??? Appearance 03/18/2019 CLOUDY    Final   ??? Specific gravity 03/18/2019 1.018  1.005 - 1.030   Final   ??? pH (UA) 03/18/2019 7.5  5.0 - 8.0   Final   ??? Protein 03/18/2019 TRACE* NEG mg/dL Final   ??? Glucose 03/18/2019 Negative  NEG mg/dL Final   ??? Ketone 03/18/2019 Negative  NEG mg/dL Final   ??? Bilirubin 03/18/2019 Negative  NEG   Final   ??? Blood 03/18/2019 Negative  NEG   Final   ??? Urobilinogen 03/18/2019 0.2  0.2 - 1.0 EU/dL Final   ??? Nitrites 03/18/2019 Negative  NEG   Final   ??? Leukocyte Esterase 03/18/2019 LARGE* NEG   Final   ??? WBC 03/18/2019 20 to 30  0 - 4 /hpf Final   ??? RBC 03/18/2019 NONE  0 - 5 /hpf Final   ??? Epithelial cells 03/18/2019 3+  0 - 5 /lpf Final   ??? Bacteria 03/18/2019 1+* NEG /hpf Final       Calculated 10 year ASCVD risk score: 13.9 %    Health Maintenance:  Screening:    Mammogram: abnormal (03/05/2019). Needs additional  views.   PAP smear: unknown   Colorectal: colonoscopy (  05/2018) in [place].    Depression: none   DM (HbA1c/FPG): HbA1c 6.5 (03/2019)   Hepatitis C: negative (03/2019)   Falls: none   DEXA: unknown   Glaucoma: unknown   Smoking: none   Vitamin D: 42.8 (03/2019)   Medicare Wellness: n/a        Impression:  Patient Active Problem List   Diagnosis Code   ??? 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HCC) C91.01   ??? S/P bone marrow transplant (HCC) Z94.81   ??? Essential hypertension I10   ??? Type 2 diabetes mellitus, with long-term current use of insulin (HCC) E11.9, Z79.4   ??? 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 Mild intermittent asthma without complication J45.20   ??? GERD (gastroesophageal reflux disease) K21.9   ??? Eczema L30.9   ??? Insomnia G47.00   ??? 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 Hyperlipidemia E78.5   ??? Abnormality of right breast on screening mammogram R92.8   ??? Overweight (BMI 25.0-29.9) E66.3   ??? Elevated alkaline phosphatase level R74.8       Plan:  1. Acute lymphoblastic leukemia, +philadelphia chrom mutation, s/p stem cell transplant. Diagnosed in 02/2017 and underwent induction chemotherapy followed by stem cell transplant in 08/2017. Now on maintenance therapy with a TKI (nilotinib). Being followed closely by Dr. Istvan Redei, transplant oncologist, at [facility] in [place] with monthly blood draws and travel to [place] every 3 months for evaluation. Has also been followed locally by Dr. Tan.   2. Hypertension. Blood pressure well controlled on amlodipine 5 mg daily. Renal function remains normal with creatinine 1.03/ eGFR >60. Will continue to monitor. On potassium and magnesium supplements due to long term difficulty with deficiency. Stable levels today. Will monitor.   3. Hyperlipidemia. Calculated 10 year ASCVD risk is 13.9 %, which places her in one of the four statin benefit groups as per new AHA/ACC guidelines (primary  prevention: 10 year ASCVD risk >7.5%). Patient also a diabetic. Agreeable to restart treatment with atorvastatin 20 mg daily.  Emphasized importance of lifestyle modifications, including diet, exercise, and weight loss. Will recheck lipid panel at next visit. Continue to follow.  4. Diabetes mellitus. Well controlled on current regimen of Tresiba and SS Humalog with HbA1c 6.5. No episodes of hypoglycemia. No evidence of microvascular complications.  On statin but not on Ace-I. Will refer to Dr. Iacobucci for annual eye exams. Foot exam grossly normal today, and no evidence of microalbuminuria with urine microalbumin/ creatinine ratio 18 mg/g. Will consider addition of Ace-I/ARB if blood pressure will allow.   5. Peripheral neuropathy, painful. Thought to be secondary to chemotherapy. On Cymbalta 30 mg bid and Roxicodone prn through pain management. Did not tolerate gabapentin. Follow.   6. Asthma, mild intermittent. On Flovent prn and albuterol as needed. Will use when symptoms increase. Currently stable.  7. GERD. On Protonix 40 mg bid with reasonably good control. Follow.  8. Abnormal mammogram. Patient reports had previous biopsy of right breast which was benign. Repeat mammogram showing multiple abnormalities in right breast. Will proceed with additional compression views +/- ultrasound if needed. Patient with FH history of breast cancer in her mother.  9. Abnormal urinalysis. Patient describing some slow urination and pressure in pelvic region. Will obtain repeat with culture, and prescribe empiric Levaquin 250 mg daily x 7 days until results known.   10. Elevated alkaline phosphatase. Unclear if related to transplant and bone marrow process. Will request   records to see if has been elevated previously. Will check GGT at next visit.   11. Insomnia. Patient using Ambien +/- Benadryl as needed. Will emphasize good sleep hygiene.   12. Eczema. On Xyzal and will use Kenalog as needed.   13. Overweight. Will  emphasize importance of lifestyle modifications, including diet, exercise, and weight loss.  14. Health maintenance. Already received influenza vaccine. Will give Pneumovax 23 and Tdap today. Will give script for Shingrix vaccine. Mammogram as discussed. Will request colonoscopy report. Will refer to Dr. Rachel Lee for well woman exam and pap smear. Will refer to Dr. Iacobucci for eye exam. Vitamin D level normal. Continue maintenance dose supplement. Hepatitis C negative. Emphasized importance of lifestyle modifications, including diet, exercise, and weight loss.    Total time: 60 minutes spent with the patient on counseling, answering questions and/or coordination of care. Complex medical review performed, including review of medical history, lab results, and testing. Complicated management plan formulated.    Patient understands recommendations and agrees with plan.  Follow-up in 3 months.

## 2019-03-19 ENCOUNTER — Inpatient Hospital Stay: Admit: 2019-03-19 | Payer: BLUE CROSS/BLUE SHIELD | Primary: Internal Medicine

## 2019-03-19 LAB — CBC WITH AUTOMATED DIFF
ABS. BASOPHILS: 0 10*3/uL (ref 0.0–0.1)
ABS. EOSINOPHILS: 0.2 10*3/uL (ref 0.0–0.4)
ABS. LYMPHOCYTES: 2.4 10*3/uL (ref 0.9–3.6)
ABS. MONOCYTES: 0.4 10*3/uL (ref 0.05–1.2)
ABS. NEUTROPHILS: 5.8 10*3/uL (ref 1.8–8.0)
BASOPHILS: 1 % (ref 0–2)
EOSINOPHILS: 2 % (ref 0–5)
HCT: 42.9 % (ref 35.0–45.0)
HGB: 14.5 g/dL (ref 12.0–16.0)
LYMPHOCYTES: 27 % (ref 21–52)
MCH: 32.4 PG (ref 24.0–34.0)
MCHC: 33.8 g/dL (ref 31.0–37.0)
MCV: 95.8 FL (ref 74.0–97.0)
MONOCYTES: 5 % (ref 3–10)
MPV: 9.8 FL (ref 9.2–11.8)
NEUTROPHILS: 65 % (ref 40–73)
PLATELET: 257 10*3/uL (ref 135–420)
RBC: 4.48 M/uL (ref 4.20–5.30)
RDW: 13.7 % (ref 11.6–14.5)
WBC: 8.8 10*3/uL (ref 4.6–13.2)

## 2019-03-19 LAB — METABOLIC PANEL, COMPREHENSIVE
A-G Ratio: 1.3 (ref 0.8–1.7)
ALT (SGPT): 33 U/L (ref 13–56)
AST (SGOT): 22 U/L (ref 10–38)
Albumin: 3.9 g/dL (ref 3.4–5.0)
Alk. phosphatase: 181 U/L — ABNORMAL HIGH (ref 45–117)
Anion gap: 7 mmol/L (ref 3.0–18)
BUN/Creatinine ratio: 12 (ref 12–20)
BUN: 12 MG/DL (ref 7.0–18)
Bilirubin, total: 0.6 MG/DL (ref 0.2–1.0)
CO2: 29 mmol/L (ref 21–32)
Calcium: 9.3 MG/DL (ref 8.5–10.1)
Chloride: 106 mmol/L (ref 100–111)
Creatinine: 1.03 MG/DL (ref 0.6–1.3)
GFR est AA: >60 mL/min/{1.73_m2} (ref >60)
GFR est non-AA: 55 mL/min/{1.73_m2} — ABNORMAL LOW (ref >60)
Globulin: 3.1 g/dL (ref 2.0–4.0)
Glucose: 141 mg/dL — ABNORMAL HIGH (ref 74–99)
Potassium: 3.9 mmol/L (ref 3.5–5.5)
Protein, total: 7 g/dL (ref 6.4–8.2)
Sodium: 142 mmol/L (ref 136–145)

## 2019-03-19 LAB — URINALYSIS W/ RFLX MICROSCOPIC
Bilirubin, Urine: NEGATIVE
Bilirubin: NEGATIVE
Blood, Urine: NEGATIVE
Blood: NEGATIVE
Glucose, Ur: NEGATIVE mg/dL
Glucose: NEGATIVE mg/dL
Ketone: NEGATIVE mg/dL
Ketones, Urine: NEGATIVE mg/dL
Nitrite, Urine: NEGATIVE
Nitrites: NEGATIVE
Specific Gravity, UA: 1.018 (ref 1.005–1.030)
Specific gravity: 1.018 (ref 1.005–1.030)
Urobilinogen, UA, POCT: 0.2 EU/dL (ref 0.2–1.0)
Urobilinogen: 0.2 EU/dL (ref 0.2–1.0)
pH (UA): 7.5 (ref 5.0–8.0)
pH, UA: 7.5 (ref 5.0–8.0)

## 2019-03-19 LAB — URINE MICROSCOPIC ONLY
WBC, UA: 20 /hpf (ref 0–4)
WBC: 20 /hpf (ref 0–4)

## 2019-03-19 LAB — HEMOGLOBIN A1C WITH EAG
Est. average glucose: 140 mg/dL
Hemoglobin A1c: 6.5 % — ABNORMAL HIGH (ref 4.2–5.6)

## 2019-03-19 LAB — LIPID PANEL
CHOL/HDL Ratio: 4 (ref 0–5.0)
Chol/HDL Ratio: 4 (ref 0–5.0)
Cholesterol, Total: 277 MG/DL — ABNORMAL HIGH (ref <200)
Cholesterol, total: 277 MG/DL — ABNORMAL HIGH (ref <200)
HDL Cholesterol: 69 MG/DL — ABNORMAL HIGH (ref 40–60)
HDL: 69 MG/DL — ABNORMAL HIGH (ref 40–60)
LDL Calculated: 160.4 MG/DL — ABNORMAL HIGH (ref 0–100)
LDL, calculated: 160.4 MG/DL — ABNORMAL HIGH (ref 0–100)
Triglyceride: 238 MG/DL — ABNORMAL HIGH (ref <150)
Triglycerides: 238 MG/DL — ABNORMAL HIGH (ref <150)
VLDL Cholesterol Calculated: 47.6 MG/DL
VLDL, calculated: 47.6 MG/DL

## 2019-03-19 LAB — MICROALBUMIN, UR, RAND W/ MICROALB/CREAT RATIO
Creatinine, urine random: 175 mg/dL — ABNORMAL HIGH (ref 30–125)
Microalbumin,urine random: 3.17 MG/DL — ABNORMAL HIGH (ref 0–3.0)
Microalbumin/Creat ratio (mg/g creat): 18 mg/g (ref 0–30)

## 2019-03-19 LAB — TSH 3RD GENERATION
TSH: 1.38 u[IU]/mL (ref 0.36–3.74)
TSH: 1.38 u[IU]/mL (ref 0.36–3.74)

## 2019-03-19 LAB — HEPATITIS C AB
Hep C virus Ab Interp.: NEGATIVE
Hepatitis C virus Ab: 0.12 Index (ref <0.80)

## 2019-03-19 LAB — MAGNESIUM
Magnesium: 2.1 mg/dL (ref 1.6–2.6)
Magnesium: 2.1 mg/dL (ref 1.6–2.6)

## 2019-03-19 LAB — VITAMIN D, 25 HYDROXY: Vitamin D 25-Hydroxy: 42.8 ng/mL (ref 30–100)

## 2019-03-19 LAB — COMPREHENSIVE METABOLIC PANEL
ALT: 33 U/L (ref 13–56)
AST: 22 U/L (ref 10–38)
Albumin/Globulin Ratio: 1.3 (ref 0.8–1.7)
Albumin: 3.9 g/dL (ref 3.4–5.0)
Alkaline Phosphatase: 181 U/L — ABNORMAL HIGH (ref 45–117)
Anion Gap: 7 mmol/L (ref 3.0–18)
BUN/Creatinine Ratio: 12 (ref 12–20)
BUN: 12 MG/DL (ref 7.0–18)
CO2: 29 mmol/L (ref 21–32)
Calcium: 9.3 MG/DL (ref 8.5–10.1)
Chloride: 106 mmol/L (ref 100–111)
Creatinine: 1.03 MG/DL (ref 0.6–1.3)
GFR African American: >60 mL/min/{1.73_m2} (ref >60)
Globulin: 3.1 g/dL (ref 2.0–4.0)
Glucose: 141 mg/dL — ABNORMAL HIGH (ref 74–99)
Potassium: 3.9 mmol/L (ref 3.5–5.5)
Sodium: 142 mmol/L (ref 136–145)
Total Bilirubin: 0.6 MG/DL (ref 0.2–1.0)
Total Protein: 7 g/dL (ref 6.4–8.2)
eGFR NON-AA: 55 mL/min/{1.73_m2} — ABNORMAL LOW (ref >60)

## 2019-03-19 LAB — CBC WITH AUTO DIFFERENTIAL
Basophils %: 1 % (ref 0–2)
Basophils Absolute: 0 10*3/uL (ref 0.0–0.1)
Eosinophils %: 2 % (ref 0–5)
Eosinophils Absolute: 0.2 10*3/uL (ref 0.0–0.4)
Hematocrit: 42.9 % (ref 35.0–45.0)
Hemoglobin: 14.5 g/dL (ref 12.0–16.0)
Lymphocytes %: 27 % (ref 21–52)
Lymphocytes Absolute: 2.4 10*3/uL (ref 0.9–3.6)
MCH: 32.4 PG (ref 24.0–34.0)
MCHC: 33.8 g/dL (ref 31.0–37.0)
MCV: 95.8 FL (ref 74.0–97.0)
MPV: 9.8 FL (ref 9.2–11.8)
Monocytes %: 5 % (ref 3–10)
Monocytes Absolute: 0.4 10*3/uL (ref 0.05–1.2)
Neutrophils %: 65 % (ref 40–73)
Neutrophils Absolute: 5.8 10*3/uL (ref 1.8–8.0)
Platelets: 257 10*3/uL (ref 135–420)
RBC: 4.48 M/uL (ref 4.20–5.30)
RDW: 13.7 % (ref 11.6–14.5)
WBC: 8.8 10*3/uL (ref 4.6–13.2)

## 2019-03-19 LAB — HEMOGLOBIN A1C W/EAG
Estimated Avg Glucose: 140 mg/dL
Hemoglobin A1C: 6.5 % — ABNORMAL HIGH (ref 4.2–5.6)

## 2019-03-19 LAB — HEPATITIS C ANTIBODY
Hepatitis C Ab: 0.12 {index} (ref <0.80)
Hepatitis C Ab: NEGATIVE

## 2019-03-19 LAB — MICROALBUMIN / CREATININE URINE RATIO
Creatinine, Ur: 175 mg/dL — ABNORMAL HIGH (ref 30–125)
Microalb, Ur: 3.17 MG/DL — ABNORMAL HIGH (ref 0–3.0)
Microalb/Creat Ratio: 18 mg/g (ref 0–30)

## 2019-03-19 LAB — VITAMIN D 25 HYDROXY: Vit D, 25-Hydroxy: 42.8 ng/mL (ref 30–100)

## 2019-03-22 ENCOUNTER — Encounter

## 2019-03-22 ENCOUNTER — Telehealth

## 2019-03-22 ENCOUNTER — Ambulatory Visit: Admit: 2019-03-22 | Discharge: 2019-03-22 | Payer: BLUE CROSS/BLUE SHIELD | Primary: Internal Medicine

## 2019-03-22 DIAGNOSIS — R8290 Unspecified abnormal findings in urine: Secondary | ICD-10-CM

## 2019-03-22 MED ORDER — ATORVASTATIN 20 MG TAB
20 mg | ORAL_TABLET | Freq: Every day | ORAL | 2 refills | Status: DC
Start: 2019-03-22 — End: 2019-07-19

## 2019-03-22 MED ORDER — LEVOFLOXACIN 250 MG TAB
250 mg | ORAL_TABLET | Freq: Every day | ORAL | 0 refills | Status: AC
Start: 2019-03-22 — End: 2019-03-29

## 2019-03-22 NOTE — Telephone Encounter (Signed)
Pt states her stem cell doctor, Dr. Sid Falcon states it is ok to take Lipitor and it is also ok to take the Levaquin for 7 days     [facility] [address]

## 2019-03-22 NOTE — Telephone Encounter (Signed)
Patient will be coming up to the office today to give a urine sample for repeat urinalysis and urine culture.    Orders placed.

## 2019-03-22 NOTE — Telephone Encounter (Signed)
Scripts sent to [facility]

## 2019-03-23 ENCOUNTER — Inpatient Hospital Stay: Admit: 2019-03-23 | Payer: BLUE CROSS/BLUE SHIELD | Primary: Internal Medicine

## 2019-03-23 LAB — URINALYSIS W/ RFLX MICROSCOPIC
Bilirubin, Urine: NEGATIVE
Bilirubin: NEGATIVE
Blood, Urine: NEGATIVE
Blood: NEGATIVE
Glucose, Ur: >1000 mg/dL — AB
Glucose: >1000 mg/dL — AB
Nitrite, Urine: NEGATIVE
Nitrites: NEGATIVE
Protein, UA: NEGATIVE mg/dL
Protein: NEGATIVE mg/dL
Specific Gravity, UA: 1.026 (ref 1.005–1.030)
Specific gravity: 1.026 (ref 1.005–1.030)
Urobilinogen, UA, POCT: 0.2 EU/dL (ref 0.2–1.0)
Urobilinogen: 0.2 EU/dL (ref 0.2–1.0)
pH (UA): 5.5 (ref 5.0–8.0)
pH, UA: 5.5 (ref 5.0–8.0)

## 2019-03-23 LAB — URINE MICROSCOPIC ONLY
RBC, UA: NEGATIVE /hpf (ref 0–5)
RBC: NEGATIVE /hpf (ref 0–5)
WBC, UA: 0 /hpf (ref 0–4)
WBC: 0 /hpf (ref 0–4)

## 2019-03-24 LAB — CULTURE, URINE
Culture result:: NO GROWTH
Culture: NO GROWTH

## 2019-03-24 NOTE — Telephone Encounter (Signed)
No visits with results within 2 Day(s) from this visit.   Latest known visit with results is:   Hospital Outpatient Visit on 03/22/2019   Component Date Value Ref Range Status   ??? Special Requests: 03/22/2019 NO SPECIAL REQUESTS    Final   ??? Culture result: 03/22/2019 No significant growth, <10,000 CFU/mL    Final   ??? Color 03/22/2019 YELLOW    Final   ??? Appearance 03/22/2019 CLOUDY    Final   ??? Specific gravity 03/22/2019 1.026  1.005 - 1.030   Final   ??? pH (UA) 03/22/2019 5.5  5.0 - 8.0   Final   ??? Protein 03/22/2019 Negative  NEG mg/dL Final   ??? Glucose 03/22/2019 >1,000* NEG mg/dL Final   ??? Ketone 03/22/2019 TRACE* NEG mg/dL Final   ??? Bilirubin 03/22/2019 Negative  NEG   Final   ??? Blood 03/22/2019 Negative  NEG   Final   ??? Urobilinogen 03/22/2019 0.2  0.2 - 1.0 EU/dL Final   ??? Nitrites 03/22/2019 Negative  NEG   Final   ??? Leukocyte Esterase 03/22/2019 SMALL* NEG   Final   ??? WBC 03/22/2019 0 to 2  0 - 4 /hpf Final   ??? RBC 03/22/2019 Negative  0 - 5 /hpf Final   ??? CA Oxalate crystals 03/22/2019 1+* NEG Final         Please let the patient know that her urine culture was negative. Would recommend completing course of antibiotics as prescribed given the initial abnormal urinalysis and symptoms.

## 2019-03-25 NOTE — Telephone Encounter (Signed)
Patient informed about urine test results, and will continue to take prescribed medication as directed by Dr. Unknown Jim.

## 2019-03-29 ENCOUNTER — Telehealth

## 2019-03-29 NOTE — Telephone Encounter (Signed)
Patient stating her thumb is not getting any better. Stating at her last appt you mentioned that she may need a shot. She would like to go ahead an go through with that.

## 2019-03-29 NOTE — Telephone Encounter (Signed)
Please clarify which hand is causing pain so referral can be made to Dr. Redmond Pulling.     Also please urge patient to schedule follow-up right mammogram/ ultrasound to further evaluate abnormalities on screening mammogram. Orders are already placed and this was already discussed with her.    Thanks.

## 2019-03-30 NOTE — Addendum Note (Signed)
Addendum Note by Blanch Media, MD at 03/30/19 1529                Author: Blanch Media, MD  Service: --  Author Type: Physician       Filed: 03/30/19 1529  Encounter Date: 03/29/2019  Status: Signed          Editor: Blanch Media, MD (Physician)          Addended by: Blanch Media on: 03/30/2019 03:29 PM    Modules accepted: Orders

## 2019-03-30 NOTE — Telephone Encounter (Signed)
Patient reports issues is with her Left hand. She is aware a referral will be placed to [facility] and that they will call her to schedule.   Patient also states she is scheduled for the diagnostic mammogram on 04/19/19.

## 2019-03-30 NOTE — Addendum Note (Signed)
Addended by: Blanch Media on: 03/30/2019 03:29 PM     Modules accepted: Orders

## 2019-03-30 NOTE — Telephone Encounter (Signed)
Referral placed.

## 2019-04-06 NOTE — Telephone Encounter (Signed)
Anderson Malta with [facility] called asking if we had any other phone numbers for patient because they haven't been able to reach her.  We only had the same number they had.  If patient calls, have her contact [facility] to schedule appt (Dr Unknown Jim referred her there)

## 2019-04-10 NOTE — Progress Notes (Signed)
1645: Patient caled into the on-call service with complaints of swollen lymph nodes.  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  For the past week she has been suffering with her lymph nodes being swollen around her neck and jaw.  She did not know if she needed to go to be seen at urgent care or wait it out.  I informed this patient due to her history of bone marrow transplant and the inability for me to properly assess her, I felt that it was best that she go to the urgent care to be evaluated since this has been going on for a week.  Patient verbalized understanding.      I also noted in the patient's chart that [facility] was trying to reach her but were unsuccessful.  I mention this to the patient and she states that she did in fact call [facility] and has rescheduled her appointment.

## 2019-04-10 NOTE — Progress Notes (Signed)
1645: Patient caled into the on-call service with complaints of swollen lymph nodes.  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  For the past week she has been suffering with her lymph nodes being swollen around her neck and jaw.  She did not know if she needed to go to be seen at urgent care or wait it out.  I informed this patient due to her history of bone marrow transplant and the inability for me to properly assess her, I felt that it was best that she go to the urgent care to be evaluated since this has been going on for a week.  Patient verbalized understanding.      I also noted in the patient's chart that [facility] was trying to reach her but were unsuccessful.  I mention this to the patient and she states that she did in fact call [facility] and has rescheduled her appointment.

## 2019-04-14 ENCOUNTER — Encounter: Attending: Orthopaedic Surgery | Primary: Internal Medicine

## 2019-04-19 ENCOUNTER — Inpatient Hospital Stay: Payer: BLUE CROSS/BLUE SHIELD | Attending: Internal Medicine | Primary: Internal Medicine

## 2019-04-19 ENCOUNTER — Ambulatory Visit: Payer: BLUE CROSS/BLUE SHIELD | Primary: Internal Medicine

## 2019-04-26 ENCOUNTER — Ambulatory Visit: Attending: Orthopaedic Surgery | Primary: Internal Medicine

## 2019-04-26 ENCOUNTER — Ambulatory Visit
Admit: 2019-04-26 | Discharge: 2019-04-26 | Payer: BLUE CROSS/BLUE SHIELD | Attending: Orthopaedic Surgery | Primary: Internal Medicine

## 2019-04-26 DIAGNOSIS — M65312 Trigger thumb, left thumb: Secondary | ICD-10-CM

## 2019-04-26 MED ORDER — TRIAMCINOLONE ACETONIDE 10 MG/ML SUSP FOR INJECTION
10 mg/mL | Freq: Once | INTRAMUSCULAR | Status: AC
Start: 2019-04-26 — End: 2019-04-26
  Administered 2019-04-26: 21:00:00

## 2019-04-26 NOTE — Progress Notes (Signed)
[name] Wu is a 58 y.o. female right handed unemployed.  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    Vitals:    04/26/19 1513   BP: 118/75   Pulse: 90   Temp: 97.5 ??F (36.4 ??C)   TempSrc: Temporal   SpO2: 95%   Weight: 198 lb 3.2 oz (89.9 kg)   Height: 5\' 8"  (1.727 m)   PainSc:   7   PainLoc: Finger           Chief Complaint   Patient presents with   ??? Thumb Pain     left thumb         HPI: Patient comes in today with complaints of left thumb pain and locking.    Date of onset: November 2020    Injury: No    Prior Treatment:  No    Numbness/ Tingling: No      ROS: Review of Systems - General ROS: negative  Psychological ROS: negative  ENT ROS: negative  Allergy and Immunology ROS: negative  Hematological and Lymphatic ROS: negative  Respiratory ROS: no cough, shortness of breath, or wheezing  Cardiovascular ROS: no chest pain or dyspnea on exertion  Gastrointestinal ROS: no abdominal pain, change in bowel habits, or black or bloody stools  Musculoskeletal ROS: positive for - pain in thumb - left  Neurological ROS: negative  Dermatological ROS: negative    Past Medical History:   Diagnosis Date   ??? 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 Diabetes ([place])    ??? GERD (gastroesophageal reflux disease) 03/18/2019   ??? 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 H/O stem cell transplant ([place]) 041/04/19   ??? Hyperlipidemia 03/22/2019   ??? Hypertension    ??? Insomnia 03/22/2019   ??? Mild intermittent asthma without complication [id number]   ??? 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    Current Outpatient Medications   Medication Sig Dispense Refill   ??? atorvastatin (LIPITOR) 20 mg tablet Take 1 Tab by mouth daily. 90 Tab 2   ??? BIOTIN PO Take 1 Tab by mouth daily.     ??? calcium carb/vit D3/minerals (CALTRATE 600+D PLUS MINERALS PO) Take  by mouth daily.     ???  amLODIPine (NORVASC) 5 mg tablet TAKE 1 TABLET BY MOUTH EVERY DAY     ??? OneTouch Ultra Blue Test Strip strip TEST 4 TIMES A DAY     ??? fluticasone propionate (Flovent HFA) 220 mcg/actuation inhaler Take 2 Puffs by inhalation two (2) times daily as needed.     ??? Nano Pen Needle 32 gauge x 5/32" ndle INJECT 4 TIMES A DAY WITH MEALS AND AT BEDTIME     ??? triamcinolone acetonide (KENALOG) 0.1 % topical cream APPLY TO AFFECTED AREA TWICE A DAY AS NEEDED FOR RASH     ??? levocetirizine dihydrochloride (XYZAL PO) Take  by mouth.     ??? RESVERATROL-QUERCETIN PO Take  by mouth.     ??? DULoxetine (CYMBALTA) 30 mg capsule TAKE 1 CAPSULE BY MOUTH TWICE A DAY     ??? albuterol (PROVENTIL VENTOLIN) 2.5 mg /3 mL (0.083 %) nebu 3 mL by Nebulization route every four (4) hours as needed (wheezing). 120 Nebule 1   ??? magnesium oxide (MAG-OX) 400 mg tablet Take 800 mg by mouth two (2)  times a day.     ??? pantoprazole (Protonix) 40 mg tablet Take 40 mg by mouth two (2) times a day.     ??? prochlorperazine (COMPAZINE) 10 mg tablet Take 10 mg by mouth every four (4) hours as needed.     ??? nilotinib (TASIGNA) 200 mg cap capsule Take 200 mg by mouth two (2) times a day.     ??? Magnesium Oxide-Mg AA Chelate (MG-PLUS) 133 mg tab Take 1 Tab by mouth two (2) times a day.     ??? cholecalciferol, VITAMIN D3, (VITAMIN D3) 5,000 unit tab tablet Take 5,000 Units by mouth daily.     ??? oxyCODONE IR (ROXICODONE) 5 mg immediate release tablet Take 2 Tabs by mouth every four (4) hours as needed. Max Daily Amount: 60 mg. 60 Tab 0   ??? acyclovir (ZOVIRAX) 400 mg tablet Take 800 mg by mouth two (2) times a day.     ??? insulin lispro (HUMALOG JUNIOR KWIKPEN U-100) 100 unit/mL inph by SubCUTAneous route three (3) times daily. 180-200 2 U; 201-220 4 U; 221-240 5 U; 241-280 6 U; 281-320 7 U; 321-400 8 U;  >400 8 U and call  Indications: sliding scale     ??? potassium chloride SR (K-TAB) 20 mEq tablet Take 40 mEq by mouth daily.     ??? insulin degludec (TRESIBA FLEXTOUCH U-200)  200 unit/mL (3 mL) inpn 30 Units by SubCUTAneous route daily.     ??? zolpidem (AMBIEN) 5 mg tablet Take 5 mg by mouth nightly.       Current Facility-Administered Medications   Medication Dose Route Frequency Provider Last Rate Last Dose   ??? triamcinolone acetonide (KENALOG) 10 mg/mL injection 5 mg  5 mg Other ONCE Laquiesha Piacente A, DO           Allergies   Allergen Reactions   ??? Pcn [Penicillins] Hives and Itching           PE:     Physical Exam  Vitals signs and nursing note reviewed.   Constitutional:       General: [name] Wu is not in acute distress.     Appearance: Normal appearance. [name] Wu is not ill-appearing.   Eyes:      Extraocular Movements: Extraocular movements intact.      Pupils: Pupils are equal, round, and reactive to light.   Neck:      Musculoskeletal: Normal range of motion and neck supple.   Cardiovascular:      Pulses: Normal pulses.   Pulmonary:      Effort: Pulmonary effort is normal. No respiratory distress.   Abdominal:      General: Abdomen is flat. There is no distension.   Musculoskeletal: Normal range of motion.         General: Swelling and tenderness present. No deformity or signs of injury.      Right lower leg: No edema.      Left lower leg: No edema.   Skin:     General: Skin is warm and dry.      Capillary Refill: Capillary refill takes less than 2 seconds.      Findings: No bruising or erythema.   Neurological:      General: No focal deficit present.      Mental Status: [name] Wu is alert and oriented to person, place, and time.      Cranial Nerves: No cranial nerve deficit.      Sensory: No sensory deficit.   Psychiatric:  Mood and Affect: Mood normal.         Behavior: Behavior normal.            Hand:    Examination L Digit(s) R Digit(s)   1st CMC Tenderness -  -    1st CMC Grind -  -    Bouchard Nodes -  -    Heberden Nodes -  -    A1 Pulley Tenderness + Th -    Triggering + Th -    UCL Instability -  -    RCL Instability -  -    Lateral Stress Pain -  -    Palmar Cords -  -     Tabletop test -  -    Garrod's Pads -  -    Grip Strength       Pinch Strength         ROM: Full        Imaging:     None indicated        ICD-10-CM ICD-9-CM    1. Trigger thumb of left hand  M65.312 727.03 triamcinolone acetonide (KENALOG) 10 mg/mL injection 5 mg      INJECT TENDON SHEATH/LIGAMENT         Plan:     Left thumb A1 pulley injection    Follow-up and Dispositions    ?? Return if symptoms worsen or fail to improve.          Plan was reviewed with patient, who verbalized agreement and understanding of the plan    [place] NOTE        Chart reviewed for the following:   I, Charonda Hefter A Khyson Sebesta, DO, have reviewed the History, Physical and updated the Allergic reactions for [name] Wu performed immediately prior to start of procedure:   I, Bradyn Vassey A Momo Braun, DO, have performed the following reviews on [name] Wu prior to the start of the procedure:            * Patient was identified by name and date of birth   * Agreement on procedure being performed was verified  * Risks and Benefits explained to the patient  * Procedure site verified and marked as necessary  * Patient was positioned for comfort  * Consent was signed and verified     Time: 15:31      Date of procedure: 04/26/2019    Procedure performed by:  Birdena Jubilee, DO    Provider assisted by: Barton Dubois LPN    Patient assisted by: self    How tolerated by patient: tolerated the procedure well with no complications    Post Procedural Pain Scale: 0 - No Hurt    Comments: none    Procedure:  After consent was obtained, using sterile technique the tendon was prepped. Local anesthetic used: 1% lidocaine. Kenalog 5 mg and was then injected and the needle withdrawn.  The procedure was well tolerated.  The patient is asked to continue to rest the area for a few more days before resuming regular activities.  It may be more painful for the first 1-2 days.  Watch for  fever, or increased swelling or persistent pain in the joint. Call or return to clinic prn if such symptoms occur or there is failure to improve as anticipated.

## 2019-04-26 NOTE — Progress Notes (Signed)
[name] Wu is a 58 y.o. female right handed unemployed.  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    Vitals:    04/26/19 1513   BP: 118/75   Pulse: 90   Temp: 97.5 ??F (36.4 ??C)   TempSrc: Temporal   SpO2: 95%   Weight: 198 lb 3.2 oz (89.9 kg)   Height: 5\' 8"  (1.727 m)   PainSc:   7   PainLoc: Finger           Chief Complaint   Patient presents with   ??? Thumb Pain     left thumb         HPI: Patient comes in today with complaints of left thumb pain and locking.    Date of onset: November 2020    Injury: No    Prior Treatment:  No    Numbness/ Tingling: No      ROS: Review of Systems - General ROS: negative  Psychological ROS: negative  ENT ROS: negative  Allergy and Immunology ROS: negative  Hematological and Lymphatic ROS: negative  Respiratory ROS: no cough, shortness of breath, or wheezing  Cardiovascular ROS: no chest pain or dyspnea on exertion  Gastrointestinal ROS: no abdominal pain, change in bowel habits, or black or bloody stools  Musculoskeletal ROS: positive for - pain in thumb - left  Neurological ROS: negative  Dermatological ROS: negative    Past Medical History:   Diagnosis Date   ??? 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 Diabetes ([place])    ??? GERD (gastroesophageal reflux disease) 03/18/2019   ??? 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 H/O stem cell transplant ([place]) 041/04/19   ??? Hyperlipidemia 03/22/2019   ??? Hypertension    ??? Insomnia 03/22/2019   ??? Mild intermittent asthma without complication [id number]   ??? 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    Current Outpatient Medications   Medication Sig Dispense Refill   ??? atorvastatin (LIPITOR) 20 mg tablet Take 1 Tab by mouth daily. 90 Tab 2   ??? BIOTIN PO Take 1 Tab by mouth daily.     ??? calcium carb/vit D3/minerals (CALTRATE 600+D PLUS MINERALS PO) Take  by mouth daily.      ??? amLODIPine (NORVASC) 5 mg tablet TAKE 1 TABLET BY MOUTH EVERY DAY     ??? OneTouch Ultra Blue Test Strip strip TEST 4 TIMES A DAY     ??? fluticasone propionate (Flovent HFA) 220 mcg/actuation inhaler Take 2 Puffs by inhalation two (2) times daily as needed.     ??? Nano Pen Needle 32 gauge x 5/32" ndle INJECT 4 TIMES A DAY WITH MEALS AND AT BEDTIME     ??? triamcinolone acetonide (KENALOG) 0.1 % topical cream APPLY TO AFFECTED AREA TWICE A DAY AS NEEDED FOR RASH     ??? levocetirizine dihydrochloride (XYZAL PO) Take  by mouth.     ??? RESVERATROL-QUERCETIN PO Take  by mouth.     ??? DULoxetine (CYMBALTA) 30 mg capsule TAKE 1 CAPSULE BY MOUTH TWICE A DAY     ??? albuterol (PROVENTIL VENTOLIN) 2.5 mg /3 mL (0.083 %) nebu 3 mL by Nebulization route every four (4) hours as needed (wheezing). 120 Nebule 1   ??? magnesium oxide (MAG-OX) 400 mg tablet Take 800 mg by mouth two (2)  times a day.     ??? pantoprazole (Protonix) 40 mg tablet Take 40 mg by mouth two (2) times a day.     ??? prochlorperazine (COMPAZINE) 10 mg tablet Take 10 mg by mouth every four (4) hours as needed.     ??? nilotinib (TASIGNA) 200 mg cap capsule Take 200 mg by mouth two (2) times a day.     ??? Magnesium Oxide-Mg AA Chelate (MG-PLUS) 133 mg tab Take 1 Tab by mouth two (2) times a day.     ??? cholecalciferol, VITAMIN D3, (VITAMIN D3) 5,000 unit tab tablet Take 5,000 Units by mouth daily.     ??? oxyCODONE IR (ROXICODONE) 5 mg immediate release tablet Take 2 Tabs by mouth every four (4) hours as needed. Max Daily Amount: 60 mg. 60 Tab 0   ??? acyclovir (ZOVIRAX) 400 mg tablet Take 800 mg by mouth two (2) times a day.     ??? insulin lispro (HUMALOG JUNIOR KWIKPEN U-100) 100 unit/mL inph by SubCUTAneous route three (3) times daily. 180-200 2 U; 201-220 4 U; 221-240 5 U; 241-280 6 U; 281-320 7 U; 321-400 8 U;  >400 8 U and call  Indications: sliding scale     ??? potassium chloride SR (K-TAB) 20 mEq tablet Take 40 mEq by mouth daily.      ??? insulin degludec (TRESIBA FLEXTOUCH U-200) 200 unit/mL (3 mL) inpn 30 Units by SubCUTAneous route daily.     ??? zolpidem (AMBIEN) 5 mg tablet Take 5 mg by mouth nightly.       Current Facility-Administered Medications   Medication Dose Route Frequency Provider Last Rate Last Dose   ??? triamcinolone acetonide (KENALOG) 10 mg/mL injection 5 mg  5 mg Other ONCE Vetta Couzens A, DO           Allergies   Allergen Reactions   ??? Pcn [Penicillins] Hives and Itching           PE:     Physical Exam  Vitals signs and nursing note reviewed.   Constitutional:       General: She is not in acute distress.     Appearance: Normal appearance. She is not ill-appearing.   Eyes:      Extraocular Movements: Extraocular movements intact.      Pupils: Pupils are equal, round, and reactive to light.   Neck:      Musculoskeletal: Normal range of motion and neck supple.   Cardiovascular:      Pulses: Normal pulses.   Pulmonary:      Effort: Pulmonary effort is normal. No respiratory distress.   Abdominal:      General: Abdomen is flat. There is no distension.   Musculoskeletal: Normal range of motion.         General: Swelling and tenderness present. No deformity or signs of injury.      Right lower leg: No edema.      Left lower leg: No edema.   Skin:     General: Skin is warm and dry.      Capillary Refill: Capillary refill takes less than 2 seconds.      Findings: No bruising or erythema.   Neurological:      General: No focal deficit present.      Mental Status: She is alert and oriented to person, place, and time.      Cranial Nerves: No cranial nerve deficit.      Sensory: No sensory deficit.   Psychiatric:  Mood and Affect: Mood normal.         Behavior: Behavior normal.            Hand:    Examination L Digit(s) R Digit(s)   1st CMC Tenderness -  -    1st CMC Grind -  -    Bouchard Nodes -  -    Heberden Nodes -  -    A1 Pulley Tenderness + Th -    Triggering + Th -    UCL Instability -  -    RCL Instability -  -     Lateral Stress Pain -  -    Palmar Cords -  -    Tabletop test -  -    Garrod's Pads -  -    Grip Strength       Pinch Strength         ROM: Full        Imaging:     None indicated        ICD-10-CM ICD-9-CM    1. Trigger thumb of left hand  M65.312 727.03 triamcinolone acetonide (KENALOG) 10 mg/mL injection 5 mg      INJECT TENDON SHEATH/LIGAMENT         Plan:     Left thumb A1 pulley injection    Follow-up and Dispositions    ?? Return if symptoms worsen or fail to improve.          Plan was reviewed with patient, who verbalized agreement and understanding of the plan    [place] NOTE        Chart reviewed for the following:   I, Johnica Armwood A Ellwood Steidle, DO, have reviewed the History, Physical and updated the Allergic reactions for [place] performed immediately prior to start of procedure:   I, Tinley Rought A Tylar Merendino, DO, have performed the following reviews on Marlyn Corporal prior to the start of the procedure:            * Patient was identified by name and date of birth   * Agreement on procedure being performed was verified  * Risks and Benefits explained to the patient  * Procedure site verified and marked as necessary  * Patient was positioned for comfort  * Consent was signed and verified     Time: 15:31      Date of procedure: 04/26/2019    Procedure performed by:  Birdena Jubilee, DO    Provider assisted by: Barton Dubois LPN    Patient assisted by: self    How tolerated by patient: tolerated the procedure well with no complications    Post Procedural Pain Scale: 0 - No Hurt    Comments: none    Procedure:  After consent was obtained, using sterile technique the tendon was prepped. Local anesthetic used: 1% lidocaine. Kenalog 5 mg and was then injected and the needle withdrawn.  The procedure was well tolerated.  The patient is asked to continue to rest the area for a few more days before  resuming regular activities.  It may be more painful for the first 1-2 days.  Watch for fever, or increased swelling or persistent pain in the joint. Call or return to clinic prn if such symptoms occur or there is failure to improve as anticipated.

## 2019-05-12 ENCOUNTER — Inpatient Hospital Stay: Admit: 2019-05-12 | Payer: BLUE CROSS/BLUE SHIELD | Attending: Internal Medicine | Primary: Internal Medicine

## 2019-05-12 DIAGNOSIS — R928 Other abnormal and inconclusive findings on diagnostic imaging of breast: Secondary | ICD-10-CM

## 2019-05-12 NOTE — Telephone Encounter (Signed)
MAM Results (most recent):  Results from [place] encounter on 05/12/19   MAM 3D TOMO W MAMMO RT DX INCL CAD    Narrative DIGITAL DIAGNOSTIC RIGHT MAMMOGRAM with 3-D imaging  LIMITED RIGHT BREAST ULTRASOUND    INDICATION:  Screening callback for  right breast asymmetries      COMPARISON:  03/05/2019      MAMMOGRAM TECHNIQUE/FINDINGS: 2-D/3-D digital mammography was performed with CAD  of the right breast. Spot compression views and ML view were obtained.    -A well-circumscribed 1.6 cm ovoid density persists at the lower central right  breast with a biopsy clip along the margin.  -At the posterior lower central right breast, there is redemonstration of very  faint low density nodular asymmetry.  -At the posterior upper outer right breast, there is a 7 mm ovoid nodular  asymmetry.    ULTRASOUND FINDINGS: Ultrasound evaluation was performed of the areas of concern  in the right breast.    -At the 7:00 position 6 cm from the nipple, there is a focal island of  heterogeneous breast tissue. This is felt to correspond with the 1.6 cm ovoid  density on mammogram.    -No sonographic correlate for the low density nodular asymmetry at the posterior  lower central right breast.    -At 11:00 position 14 cm from the nipple, there is a 1.3 x 1.1 x 0.3 cm  circumscribed hypoechoic mass with surrounding echogenicity, presumed  circumscribed hypoechoic mass with an island of tissue. This corresponds with  the ovoid asymmetry at the posterior upper outer right breast on mammogram.        Impression IMPRESSION:     1. Probably benign findings in the right breast as follows:  -Ovoid density at the lower central right breast, middle depth.  -Low density nodular asymmetry at the posterior lower central right breast.  -Ovoid asymmetry at the posterior upper outer right breast.    Recommend short interval six-month follow-up diagnostic right mammogram to  ascertain stability. Ultrasound if necessary.     BIRADS 3:  Probably Benign  Breast density B: There are scattered areas of fibroglandular density         Please let the patient know that the right diagnostic mammogram and ultrasound showed multiple densities that were felt likely to be benign. Recommendation was to proceed with short interval 6 month follow-up with repeat right mammogram+/- ultrasound in 10/2019. Will place orders at next visit.

## 2019-05-13 NOTE — Telephone Encounter (Signed)
Pt returning call to nurse [phone]

## 2019-05-13 NOTE — Telephone Encounter (Signed)
Patient contacted, patient identified with two identifiers (Name & DOB).  Patient aware of results per Dr.Sarris and verbalizes understanding.

## 2019-05-13 NOTE — Telephone Encounter (Signed)
LVM for the patient to return my call regarding their results.

## 2019-06-16 ENCOUNTER — Ambulatory Visit: Primary: Internal Medicine

## 2019-06-17 ENCOUNTER — Inpatient Hospital Stay: Admit: 2019-06-17 | Payer: MEDICARE | Primary: Internal Medicine

## 2019-06-17 ENCOUNTER — Ambulatory Visit: Admit: 2019-06-17 | Discharge: 2019-06-17 | Payer: MEDICARE | Primary: Internal Medicine

## 2019-06-17 DIAGNOSIS — Z0001 Encounter for general adult medical examination with abnormal findings: Secondary | ICD-10-CM

## 2019-06-18 LAB — LIPID PANEL
CHOL/HDL Ratio: 2.8 (ref 0–5.0)
Chol/HDL Ratio: 2.8 (ref 0–5.0)
Cholesterol, Total: 179 MG/DL (ref <200)
Cholesterol, total: 179 MG/DL (ref <200)
HDL Cholesterol: 63 MG/DL — ABNORMAL HIGH (ref 40–60)
HDL: 63 MG/DL — ABNORMAL HIGH (ref 40–60)
LDL Calculated: 87 MG/DL (ref 0–100)
LDL, calculated: 87 MG/DL (ref 0–100)
Triglyceride: 145 MG/DL (ref <150)
Triglycerides: 145 MG/DL (ref <150)
VLDL Cholesterol Calculated: 29 MG/DL
VLDL, calculated: 29 MG/DL

## 2019-06-18 LAB — METABOLIC PANEL, COMPREHENSIVE
A-G Ratio: 1.2 (ref 0.8–1.7)
ALT (SGPT): 25 U/L (ref 13–56)
AST (SGOT): 15 U/L (ref 10–38)
Albumin: 3.6 g/dL (ref 3.4–5.0)
Alk. phosphatase: 164 U/L — ABNORMAL HIGH (ref 45–117)
Anion gap: 5 mmol/L (ref 3.0–18)
BUN/Creatinine ratio: 11 — ABNORMAL LOW (ref 12–20)
BUN: 11 MG/DL (ref 7.0–18)
Bilirubin, total: 0.9 MG/DL (ref 0.2–1.0)
CO2: 31 mmol/L (ref 21–32)
Calcium: 8.6 MG/DL (ref 8.5–10.1)
Chloride: 109 mmol/L (ref 100–111)
Creatinine: 1.04 MG/DL (ref 0.6–1.3)
GFR est AA: >60 mL/min/{1.73_m2} (ref >60)
GFR est non-AA: 54 mL/min/{1.73_m2} — ABNORMAL LOW (ref >60)
Globulin: 2.9 g/dL (ref 2.0–4.0)
Glucose: 159 mg/dL — ABNORMAL HIGH (ref 74–99)
Potassium: 4.1 mmol/L (ref 3.5–5.5)
Protein, total: 6.5 g/dL (ref 6.4–8.2)
Sodium: 145 mmol/L (ref 136–145)

## 2019-06-18 LAB — GGT
GGT: 81 U/L — ABNORMAL HIGH (ref 5–55)
GGT: 81 U/L — ABNORMAL HIGH (ref 5–55)

## 2019-06-18 LAB — HEMOGLOBIN A1C WITH EAG
Est. average glucose: 157 mg/dL
Hemoglobin A1c: 7.1 % — ABNORMAL HIGH (ref 4.2–5.6)

## 2019-06-18 LAB — MAGNESIUM
Magnesium: 2 mg/dL (ref 1.6–2.6)
Magnesium: 2 mg/dL (ref 1.6–2.6)

## 2019-06-18 LAB — VITAMIN D, 25 HYDROXY: Vitamin D 25-Hydroxy: 38.4 ng/mL (ref 30–100)

## 2019-06-18 LAB — COMPREHENSIVE METABOLIC PANEL
ALT: 25 U/L (ref 13–56)
AST: 15 U/L (ref 10–38)
Albumin/Globulin Ratio: 1.2 (ref 0.8–1.7)
Albumin: 3.6 g/dL (ref 3.4–5.0)
Alkaline Phosphatase: 164 U/L — ABNORMAL HIGH (ref 45–117)
Anion Gap: 5 mmol/L (ref 3.0–18)
BUN/Creatinine Ratio: 11 — ABNORMAL LOW (ref 12–20)
BUN: 11 MG/DL (ref 7.0–18)
CO2: 31 mmol/L (ref 21–32)
Calcium: 8.6 MG/DL (ref 8.5–10.1)
Chloride: 109 mmol/L (ref 100–111)
Creatinine: 1.04 MG/DL (ref 0.6–1.3)
GFR African American: >60 mL/min/{1.73_m2} (ref >60)
Globulin: 2.9 g/dL (ref 2.0–4.0)
Glucose: 159 mg/dL — ABNORMAL HIGH (ref 74–99)
Potassium: 4.1 mmol/L (ref 3.5–5.5)
Sodium: 145 mmol/L (ref 136–145)
Total Bilirubin: 0.9 MG/DL (ref 0.2–1.0)
Total Protein: 6.5 g/dL (ref 6.4–8.2)
eGFR NON-AA: 54 mL/min/{1.73_m2} — ABNORMAL LOW (ref >60)

## 2019-06-18 LAB — VITAMIN D 25 HYDROXY: Vit D, 25-Hydroxy: 38.4 ng/mL (ref 30–100)

## 2019-06-18 LAB — HEMOGLOBIN A1C W/EAG
Estimated Avg Glucose: 157 mg/dL
Hemoglobin A1C: 7.1 % — ABNORMAL HIGH (ref 4.2–5.6)

## 2019-06-22 ENCOUNTER — Ambulatory Visit: Attending: Internal Medicine | Primary: Internal Medicine

## 2019-07-19 MED ORDER — ATORVASTATIN 20 MG TAB
20 mg | ORAL_TABLET | Freq: Every day | ORAL | 2 refills | Status: DC
Start: 2019-07-19 — End: 2020-03-28

## 2019-07-19 NOTE — Telephone Encounter (Signed)
[facility] is requesting a 90 day supply  Previous Rx was sent to [facility]    Last Visit: 03/18/19 with MD Unknown Jim  Next Appointment: no show 06/22/19  Previous Refill Encounter(s): 03/22/19 #90 with 2 refills    Requested Prescriptions     Pending Prescriptions Disp Refills   ??? atorvastatin (LIPITOR) 20 mg tablet 90 Tab 2     Sig: Take 1 Tab by mouth daily.

## 2019-09-01 ENCOUNTER — Ambulatory Visit: Attending: Internal Medicine | Primary: Internal Medicine

## 2019-09-01 ENCOUNTER — Ambulatory Visit: Admit: 2019-09-01 | Discharge: 2019-09-01 | Payer: MEDICARE | Attending: Internal Medicine | Primary: Internal Medicine

## 2019-09-01 DIAGNOSIS — R928 Other abnormal and inconclusive findings on diagnostic imaging of breast: Secondary | ICD-10-CM

## 2019-09-01 DIAGNOSIS — H8120 Vestibular neuronitis, unspecified ear: Secondary | ICD-10-CM

## 2019-09-01 MED ORDER — MECLIZINE 25 MG TAB
25 mg | ORAL_TABLET | Freq: Three times a day (TID) | ORAL | 1 refills | Status: AC | PRN
Start: 2019-09-01 — End: 2019-09-11

## 2019-09-01 NOTE — Progress Notes (Signed)
HPI:   [name] Wu is a 59 y.o. year old female who presents today for an acute visit. She has a history of acute lymphoblastic leukemia, s/p stem cell transplant, hypertension, hyperlipidemia, diabetes mellitus, peripheral neuropathy, asthma, GERD, insomnia, and osteoarthritis. She reports that she is doing reasonably well. She is continuing to follow with the [facility] in [place] and travels there every 3 months for exam, which is due in next few weeks. She reports that he has had difficulty with dizziness for the last 6 days which worsens when turning or changing position. She reports waxing and waning symptoms with associated nausea. She denies any tinnitus, ear pain, sore throat, fever, chills, sinus pressure, or worsening allergies. She denies any difficulty ambulating. She does report a history of severe motion sickness on cruises or when riding in the back of a car. She also reports that she completed her Moderna COVID 19 vaccine series. She is otherwise without complaints and feeling generally well.     She has a history of acute lymphoblastic leukemia, diagnosed in 02/2017 after experiencing severe fatigue and bone pain for several months. She was positive for the [place] chromosome mutation. She presented to the [facility] in [place] for care, and underwent chemotherapy which achieved remission. She subsequently underwent a stem cell transplant on 08/14/2017, felt to be curative. She is now on maintenance therapy with a tyrosine kinase inhibitor, nilotimib 200 mg bid. She is followed by Dr. Noemi Chapel , transplant oncologist, and travels to [facility] every three months for care as discussed. She is also followed locally by Dr. Edd Arbour.     He has a history of hypertension, treated with amlodipine. She has a history of hyperlipidemia, and was previously taking atorvastatin. However this was discontinued during her cancer treatment. She also has a history  of diabetes mellitus, currently being treated with [place] and Humalog. She denies any polyuria, polydipsia, nocturia, or blurry vision, and has no history of retinopathy or nephropathy. 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 She is currently under the care of pain management and is being treated with Cymbalta and Roxicodone as needed.     She has a history of asthma since childhood, and is being maintained on Flovent and albuterol as needed. She denies any cough or shortness of breath.     In 04/2018, she was admitted to [facility] from 12/4-12/10/2017 for a UTI and then subsequently readmitted from 12/6-12/17/2019 with MRSE bacteremia related to a PICC line and bibasilar pneumonia.     He has a history of GERD, treated with Protonix. She also underwent a screening colonoscopy in 05/2018 in [place]. Report unavailable. She denies any abdominal pain, nausea, vomiting, melena, hematochezia, or change in bowel movements.      Past Medical History:   Diagnosis Date   ??? ALL (acute lymphoblastic leukemia) ([place]) 02/2017    s/p stem cell transplant 08/2017; [facility] in [place]   ??? Diabetes ([place])    ??? GERD (gastroesophageal reflux disease) 03/18/2019   ??? 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 H/O stem cell transplant ([place]) 041/04/19   ??? Hyperlipidemia 03/22/2019   ??? Hypertension    ??? Insomnia 03/22/2019   ??? Mild intermittent asthma without complication 63/0/1601   ??? Peripheral neuropathy due to chemotherapy ([place]) 03/18/2019     No past surgical history on file.  Current Outpatient  Medications   Medication Sig   ??? diphenhydrAMINE (BenadryL) 25 mg capsule Take 25 mg by mouth every six (6) hours as needed.   ??? ZINC PO Take  by mouth.   ??? thiamine HCl (VITAMIN B-1 PO) Take  by mouth.   ??? ascorbic acid (VITAMIN C PO) Take  by mouth.   ??? meclizine (ANTIVERT) 25 mg tablet Take 1 Tab by mouth three (3) times daily as needed for  Dizziness for up to 10 days. Indications: sensation of spinning or whirling   ??? atorvastatin (LIPITOR) 20 mg tablet Take 1 Tab by mouth daily.   ??? BIOTIN PO Take 1 Tab by mouth daily.   ??? calcium carb/vit D3/minerals (CALTRATE 600+D PLUS MINERALS PO) Take  by mouth daily.   ??? amLODIPine (NORVASC) 5 mg tablet TAKE 1 TABLET BY MOUTH EVERY DAY   ??? OneTouch Ultra Blue Test Strip strip TEST 4 TIMES A DAY   ??? fluticasone propionate (Flovent HFA) 220 mcg/actuation inhaler Take 2 Puffs by inhalation two (2) times daily as needed.   ??? Nano Pen Needle 32 gauge x 5/32" ndle INJECT 4 TIMES A DAY WITH MEALS AND AT BEDTIME   ??? triamcinolone acetonide (KENALOG) 0.1 % topical cream APPLY TO AFFECTED AREA TWICE A DAY AS NEEDED FOR RASH   ??? levocetirizine dihydrochloride (XYZAL PO) Take  by mouth.   ??? RESVERATROL-QUERCETIN PO Take  by mouth.   ??? DULoxetine (CYMBALTA) 30 mg capsule TAKE 1 CAPSULE BY MOUTH TWICE A DAY   ??? albuterol (PROVENTIL VENTOLIN) 2.5 mg /3 mL (0.083 %) nebu 3 mL by Nebulization route every four (4) hours as needed (wheezing).   ??? magnesium oxide (MAG-OX) 400 mg tablet Take 800 mg by mouth two (2) times a day.   ??? pantoprazole (Protonix) 40 mg tablet Take 40 mg by mouth two (2) times a day.   ??? prochlorperazine (COMPAZINE) 10 mg tablet Take 10 mg by mouth every four (4) hours as needed.   ??? nilotinib (TASIGNA) 200 mg cap capsule Take 200 mg by mouth two (2) times a day.   ??? Magnesium Oxide-Mg AA Chelate (MG-PLUS) 133 mg tab Take 1 Tab by mouth two (2) times a day.   ??? cholecalciferol, VITAMIN D3, (VITAMIN D3) 5,000 unit tab tablet Take 5,000 Units by mouth daily.   ??? oxyCODONE IR (ROXICODONE) 5 mg immediate release tablet Take 2 Tabs by mouth every four (4) hours as needed. Max Daily Amount: 60 mg.   ??? acyclovir (ZOVIRAX) 400 mg tablet Take 800 mg by mouth two (2) times a day.   ??? insulin lispro (HUMALOG JUNIOR KWIKPEN U-100) 100 unit/mL inph by SubCUTAneous route three (3) times daily. 180-200 2 U; 201-220 4 U;  221-240 5 U; 241-280 6 U; 281-320 7 U; 321-400 8 U;  >400 8 U and call  Indications: sliding scale   ??? potassium chloride SR (K-TAB) 20 mEq tablet Take 40 mEq by mouth daily.   ??? insulin degludec (TRESIBA FLEXTOUCH U-200) 200 unit/mL (3 mL) inpn 30 Units by SubCUTAneous route daily.   ??? zolpidem (AMBIEN) 5 mg tablet Take 5 mg by mouth nightly.     No current facility-administered medications for this visit.      Allergies and Intolerances:   Allergies   Allergen Reactions   ??? Pcn [Penicillins] Hives and Itching     Family History: She has no family history of colon cancer of leukemia. Mother had breast cancer at the age of 55.  Family History   Problem Relation Age of Onset   ??? Breast Cancer Mother      Social History:   She  reports that she has never smoked. She has never used smokeless tobacco. She is divorced and has no children. She is a Optometrist, and working virtually due to the pandemic.     Social History     Substance and Sexual Activity   Alcohol Use No   ??? Frequency: Never     Immunization History:  Immunization History   Administered Date(s) Administered   ??? Covid-19, MODERNA, Mrna, Lnp-s, Pf, 147mg/0.5mL 06/16/2019, 07/14/2019   ??? Influenza Vaccine 02/27/2018, 02/23/2019   ??? Pneumococcal Polysaccharide (PPSV-23) 03/18/2019   ??? Tdap 03/18/2019       Review of Systems:   As above included in HPI.  Otherwise 11 point review of systems negative including constitutional, skin, HENT, eyes, respiratory, cardiovascular, gastrointestinal, genitourinary, musculoskeletal, endocrine, hematologic, allergy, and neurologic.    Physical:   Vitals:   BP: 112/74  HR: 82  WT: 203 lb (92.1 kg)  BMI:  29.50 kg/m2      Exam:   Patient appears in no apparent distress. Affect is appropriate.    HEENT: PERRLA, anicteric, bilateral ear canals clear without cerumen and normal TM reflex; no JVD; mild submandibular adenopathy; no thyromegaly. No carotid bruits or radiated murmur.  Lungs: clear to auscultation, no wheezes,  rhonchi, or rales.  Heart: regular rate and rhythm. No murmur, rubs, gallops  Abdomen: soft, nontender, nondistended, normal bowel sounds, no hepatosplenomegaly or masses.   Extremities: without edema.    Neuro: CN III-XII intact, no nystagmus        Review of Data:  Labs:  No visits with results within 2 Day(s) from this visit.   Latest known visit with results is:   Hospital Outpatient Visit on 06/17/2019   Component Date Value Ref Range Status   ??? Hemoglobin A1c 06/17/2019 7.1* 4.2 - 5.6 % Final   ??? Est. average glucose 06/17/2019 157  mg/dL Final   ??? LIPID PROFILE 06/17/2019        Final   ??? Cholesterol, total 06/17/2019 179  <200 MG/DL Final   ??? Triglyceride 06/17/2019 145  <150 MG/DL Final   ??? HDL Cholesterol 06/17/2019 63* 40 - 60 MG/DL Final   ??? LDL, calculated 06/17/2019 87  0 - 100 MG/DL Final   ??? VLDL, calculated 06/17/2019 29  MG/DL Final   ??? CHOL/HDL Ratio 06/17/2019 2.8  0 - 5.0   Final   ??? Magnesium 06/17/2019 2.0  1.6 - 2.6 mg/dL Final   ??? Sodium 06/17/2019 145  136 - 145 mmol/L Final   ??? Potassium 06/17/2019 4.1  3.5 - 5.5 mmol/L Final   ??? Chloride 06/17/2019 109  100 - 111 mmol/L Final   ??? CO2 06/17/2019 31  21 - 32 mmol/L Final   ??? Anion gap 06/17/2019 5  3.0 - 18 mmol/L Final   ??? Glucose 06/17/2019 159* 74 - 99 mg/dL Final   ??? BUN 06/17/2019 11  7.0 - 18 MG/DL Final   ??? Creatinine 06/17/2019 1.04  0.6 - 1.3 MG/DL Final   ??? BUN/Creatinine ratio 06/17/2019 11* 12 - 20   Final   ??? GFR est AA 06/17/2019 >60  >60 ml/min/1.736mFinal   ??? GFR est non-AA 06/17/2019 54* >60 ml/min/1.7384minal   ??? Calcium 06/17/2019 8.6  8.5 - 10.1 MG/DL Final   ??? Bilirubin, total 06/17/2019 0.9  0.2 -  1.0 MG/DL Final   ??? ALT (SGPT) 06/17/2019 25  13 - 56 U/L Final   ??? AST (SGOT) 06/17/2019 15  10 - 38 U/L Final   ??? Alk. phosphatase 06/17/2019 164* 45 - 117 U/L Final   ??? Protein, total 06/17/2019 6.5  6.4 - 8.2 g/dL Final   ??? Albumin 06/17/2019 3.6  3.4 - 5.0 g/dL Final   ??? Globulin 06/17/2019 2.9  2.0 - 4.0 g/dL Final    ??? A-G Ratio 06/17/2019 1.2  0.8 - 1.7   Final   ??? Vitamin D 25-Hydroxy 06/17/2019 38.4  30 - 100 ng/mL Final   ??? GGT 06/17/2019 81* 5 - 55 U/L Final       Calculated 10 year ASCVD risk score: 13.9 %    Health Maintenance:  Screening:    Mammogram: abnormal (05/12/2019) right mammo/ultrasound due 10/2019.   PAP smear: unknown   Colorectal: colonoscopy (05/2018) in [place].    Depression: none   DM (HbA1c/FPG): HbA1c 7.1 (06/2019)   Hepatitis C: negative (03/2019)   Falls: none   DEXA: unknown   Glaucoma: unknown   Smoking: none   Vitamin D: 42.8 (03/2019)   Medicare Wellness: due for Initial wellness viist        Impression:  Patient Active Problem List   Diagnosis Code   ??? 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[place]) C91.01   ??? S/P bone marrow transplant ([place]) Z94.81   ??? Essential hypertension I10   ??? Type 2 diabetes mellitus, with long-term current use of insulin ([facility]) E11.9, Z79.4   ??? Peripheral neuropathy due to chemotherapy ([place]) G62.0, T45.1X5A   ??? Mild intermittent asthma without complication W10.93   ??? GERD (gastroesophageal reflux disease) K21.9   ??? Eczema L30.9   ??? Insomnia G47.00   ??? 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 Hyperlipidemia E78.5   ??? Abnormal mammogram of right breast R92.8   ??? Class 1 obesity due to excess calories with serious comorbidity and body mass index (BMI) of 30.0 to 30.9 in adult E66.09, Z68.30   ??? Elevated alkaline phosphatase level R74.8       Plan:  Vestibular neuritis. Patient reports 6 day history of intermittent dizziness/ vertigo which worsened with position change. Associated with nausea. History of severe motion sickness on cruises and when sitting in the back seat of a car. Will treat with meclizine 25 mg tid prn. Discussed conservative measures and provided with handouts for Epley maneuver. If no improvement, advised to call and will proceed with balance therapy.     Other issues:  1. Acute lymphoblastic leukemia, +[place] chrom mutation, s/p  stem cell transplant. Diagnosed in 02/2017 and underwent induction chemotherapy followed by stem cell transplant in 08/2017. Now on maintenance therapy with a TKI (nilotinib). Being followed closely by Dr. Noemi Chapel, transplant oncologist, at [facility] in [place] with monthly blood draws and travel to [place] every 3 months for evaluation. Has also been followed locally by Dr. Edd Arbour. Due for travel to [place] next month.   2. Hypertension. Blood pressure well controlled on amlodipine 5 mg daily. Renal function remains normal with creatinine 1.04/ eGFR >60. Will continue to monitor. On potassium and magnesium supplements due to long term difficulty with deficiency. Stable levels when last checked in 06/2019. Will consider changing treatment to Ace-I/ARB given renoprotective effect in diabetes mellitus. Will address at next visit.   3. Hyperlipidemia. Calculated 10 year ASCVD risk is 13.9 %, which placed  her in one of the four statin benefit groups as per new AHA/ACC guidelines (primary prevention: 10 year ASCVD risk >7.5%). Patient also a diabetic. Started treatment with atorvastatin 20 mg daily in 03/2019, and repeat lipid panel with LDL 87 and HDL 63 in 06/2019. Emphasized importance of lifestyle modifications, including diet, exercise, and weight loss. Will recheck lipid panel at next visit. Continue to follow.  4. Diabetes mellitus. Previously well controlled on current regimen of Tresiba 30 units daily and SS Humalog with HbA1c 6.5 in 03/2019. However, increased to HbA1c 7.1 in 06/2019, likely related to weight gain. Reports blood sugars have been in the 200's and advised to titrate Tresiba by 2-3 units every 3 days to achieve fasting blood sugar of <150. Will reassess next visit. No evidence of microvascular complications. Started on statin but not on Ace-I. Referred to Dr. Faustino Congress for eye exam, but no-showed for appointment. Foot exam grossly normal today, and no evidence of  microalbuminuria with urine microalbumin/ creatinine ratio 18 mg/g. Emphasized importance of lifestyle modifications, including diet, exercise, and weight loss.   5. Peripheral neuropathy, painful. Thought to be secondary to chemotherapy. On Cymbalta 30 mg bid and Roxicodone prn through pain management. Did not tolerate gabapentin. Follow.   6. Asthma, mild intermittent. On Flovent prn and albuterol as needed. Will use when symptoms increase. Currently stable.  7. GERD. On Protonix 40 mg bid with reasonably good control. Follow.  8. Abnormal mammogram. Patient reports had previous biopsy of right breast which was benign. Screening mammogram (02/2019) showed multiple abnormalities in right breast. Right breast diagnostic mammogram and ultrasound (05/12/2019) showed probably benign findings, but recommendation to proceed with short interval 6 month follow-up (due 10/2019). Will place orders.  Patient with FH history of breast cancer in her mother.  9. Left trigger thumb. Evaluated by Dr. Redmond Pulling in 04/2019 and received a cortisone injection with improvement.   10. Elevated alkaline phosphatase. Unclear if related to transplant and bone marrow process. Will request records to see if has been elevated previously. However, GGT elevated at 81 in 06/17/2019 so likely of hepatic origin. Also, will check AMA and alkaline phosphatase isozymes at next visit, and discuss proceeding with right upper quadrant ultrasound.    11. Insomnia. Patient using Ambien +/- Benadryl as needed. Will emphasize good sleep hygiene.   12. Eczema. On Xyzal and will use Kenalog as needed.   13. Mild submandibular adenopathy. Patient reports that having dental issues in lower molars needing root canal +/- extraction. Will monitor.   14. Obesity. Weight increased nearly 10 pounds since 03/2019. BMI now >30. Will emphasize importance of lifestyle modifications, including diet, exercise, and weight loss.  15. Health maintenance. Completed Moderna COVID 19  vaccine series. Completed Pneumovax 23 and Tdap. Given script for Shingrix vaccine but not yet obtained. Mammogram as discussed. Will attempt to request colonoscopy report. Referred to Dr. Charlyne Mom for well woman exam and pap smear. Will address if kept appointment. Referred to Dr. Faustino Congress for eye exams, but patient no-showed for appointment. Will readdress at next visit.  Vitamin D level normal. Continue maintenance dose supplement. Emphasized importance of lifestyle modifications, including diet, exercise, and weight loss. Due for an Initial Medicare Wellness visit and will perform at next visit.    Patient understands recommendations and agrees with plan.  Follow-up in 6 weeks for physical.

## 2019-09-01 NOTE — Progress Notes (Signed)
1. Have you been to the ER, urgent care clinic or hospitalized since your last visit? NO.     2. Have you seen or consulted any other health care providers outside of the [facility] since your last visit (Include any pap smears or colon screening)? NO

## 2019-09-01 NOTE — Progress Notes (Signed)
1. Have you been to the ER, urgent care clinic or hospitalized since your last visit? NO.     2. Have you seen or consulted any other health care providers outside of the [facility] since your last visit (Include any pap smears or colon screening)? NO

## 2019-09-01 NOTE — Patient Instructions (Addendum)
Titrate Tresiba dose to achieve goal fasting morning blood sugar of less than 150.   Increase by 2-3 units every 3 days until goal achieved.        Benign Paroxysmal Positional Vertigo (BPPV): Care Instructions  Your Care Instructions     Benign paroxysmal positional vertigo, also called BPPV, is an inner ear problem. It causes a spinning or whirling sensation when you move your head. This sensation is called vertigo.  The vertigo usually lasts for less than a minute. People often have vertigo spells for a few days or weeks. Then the vertigo goes away. But it may come back again. The vertigo may be mild, or it may be bad enough to cause unsteadiness, nausea, and vomiting.  When you move, your inner ear sends messages to the brain. This helps you keep your balance. Vertigo can happen when debris builds up in the inner ear. The buildup can cause the inner ear to send the wrong message to the brain.  Your doctor may move you in different positions to help your vertigo get better faster. This is called the Epley maneuver. Your doctor may also prescribe medicines or exercises to help with your symptoms.  Follow-up care is a key part of your treatment and safety. Be sure to make and go to all appointments, and call your doctor if you are having problems. It's also a good idea to know your test results and keep a list of the medicines you take.  How can you care for yourself at home?  ?? If your doctor suggests that you do Brandt-Daroff exercises:  ? Sit on the edge of a bed or sofa. Quickly lie down on the side that causes the worst vertigo. Lie on your side with your ear down.  ? Stay in this position for at least 30 seconds or until the vertigo goes away.  ? Sit up. If this causes vertigo, wait for it to stop.  ? Repeat the procedure on the other side.  ? Repeat this 10 times. Do these exercises 2 times a day until the vertigo is gone.  When should you call for help?   Call 911 anytime you think you may need emergency  care. For example, call if:  ?? ?? You have symptoms of a stroke. These may include:  ? Sudden numbness, tingling, weakness, or loss of movement in your face, arm, or leg, especially on only one side of your body.  ? Sudden vision changes.  ? Sudden trouble speaking.  ? Sudden confusion or trouble understanding simple statements.  ? Sudden problems with walking or balance.  ? A sudden, severe headache that is different from past headaches.   Call your doctor now or seek immediate medical care if:  ?? ?? You have new or worse nausea and vomiting.   ?? ?? You have new symptoms such as hearing loss or roaring in your ears.   Watch closely for changes in your health, and be sure to contact your doctor if:  ?? ?? You are not getting better as expected.   ?? ?? Your vertigo gets worse.   Where can you learn more?  Go to http://clayton-rivera.info/  Enter P372 in the search box to learn more about "Benign Paroxysmal Positional Vertigo (BPPV): Care Instructions."  Current as of: April 14, 2019??????????????????????????????Content Version: 12.8  ?? 2006-2021 [employer].   Care instructions adapted under license by [employer] (which disclaims liability or warranty for this information). If you have  questions about a medical condition or this instruction, always ask your healthcare professional. [place] any warranty or liability for your use of this information.         Epley Maneuver at Home for Vertigo: Exercises  Introduction  Vertigo is a spinning or whirling sensation when you move your head.  Your doctor may have moved you in different positions to help your vertigo get better faster. This is called the Epley maneuver. Your doctor also may have asked you to do these exercises at home.  Do the exercises as often as your doctor recommends. If your vertigo is getting worse, your doctor may have you change the exercise or stop it.  Step 1  Step 1   1. Sit on the edge of a bed or  sofa.    Step 2   1. Turn your head 45 degrees in the direction your doctor told you to. This should be toward the ear that causes the most vertigo for you. In this picture, the woman is turning toward her left ear.    Step 3   1. Tilt yourself backward until you are lying on your back. Your head should still be at a 45-degree turn. Your head should be about midway between looking straight ahead and looking out to your side. Hold for 30 seconds. If you have vertigo, stay in this position until it stops.    Step 4   1. Turn your head 90 degrees toward the ear that has the least vertigo. In this picture, the woman is turning to the right because she has vertigo on her left side. The point of your chin should be raised and over your shoulder. Hold for 30 seconds.    Step 5   1. Roll onto the side with the least vertigo. You should now be looking at the floor. Hold for 30 seconds.    Follow-up care is a key part of your treatment and safety. Be sure to make and go to all appointments, and call your doctor if you are having problems. It's also a good idea to know your test results and keep a list of the medicines you take.  Where can you learn more?  Go to http://clayton-rivera.info/  Enter P834 in the search box to learn more about "Epley Maneuver at Home for Vertigo: Exercises."  Current as of: December 15, 2018??????????????????????????????Content Version: 12.8  ?? 2006-2021 [employer].   Care instructions adapted under license by [employer] (which disclaims liability or warranty for this information). If you have questions about a medical condition or this instruction, always ask your healthcare professional. [place] any warranty or liability for your use of this information.         [place] Exercises for Vertigo: Care Instructions  Your Care Instructions  Simple exercises can help you regain your balance when you have vertigo. If you have M??ni??re's disease,  benign paroxysmal positional vertigo (BPPV), or another inner ear problem, you may have vertigo off and on.  Do these exercises first thing in the morning and before you go to bed. You might get dizzy when you first start them. If this happens, try to do them for at least 5 minutes. Do a group of exercises at a time, starting at the top of the list. It may take several weeks before you can do all the exercises without feeling dizzy.  Follow-up care is a key part of your treatment and safety. Be sure to  make and go to all appointments, and call your doctor if you are having problems. It's also a good idea to know your test results and keep a list of the medicines you take.  How can you care for yourself at home?  Exercise 1  While sitting on the side of the bed and holding your head still:  ?? Look up as far as you can.  ?? Look down as far as you can.  ?? Look from side to side as far as you can.  ?? Stretch your arm straight out in front of you. Focus on your index finger. Continue to focus on your finger while you bring it to your nose.  Exercise 2  While sitting on the side of the bed:  ?? Bring your head as far back as you can.  ?? Bring your head forward to touch your chin to your chest.  ?? Turn your head from side to side.  ?? Do these exercises first with your eyes open. Then try with your eyes closed.  Exercise 3  While sitting on the side of the bed:  ?? Shrug your shoulders straight upward, then relax them.  ?? Bend over and try to touch the ground with your fingers. Then go back to a sitting position.  ?? Toss a small ball from one hand to the other. Throw the ball higher than your eyes so you have to look up.  Exercise 4  While standing (with someone close by if you feel uncomfortable):  ?? Repeat Exercise 1.  ?? Repeat Exercise 2.  ?? Pass a ball between your legs and above your head.  ?? Sit down and then stand up. Repeat. Turn around in a circle a different way each time you stand.  ?? With someone close by to help  you, try the above exercises with your eyes closed.  Exercise 5  In a room that is cleared of obstacles:  ?? Walk to a corner of the room, turn to your right, and walk back to the starting point. Now, repeat and turn left.  ?? Walk up and down a slope. Now try stairs.  ?? While holding on to someone's arm, try these exercises with your eyes closed.  When should you call for help?  Watch closely for changes in your health, and be sure to contact your doctor if:  ?? ?? You do not get better as expected.   Where can you learn more?  Go to http://clayton-rivera.info/  Enter A743 in the search box to learn more about "[place]orne Exercises for Vertigo: Care Instructions."  Current as of: April 14, 2019??????????????????????????????Content Version: 12.8  ?? 2006-2021 [employer], [employer].   Care instructions adapted under license by [employer] (which disclaims liability or warranty for this information). If you have questions about a medical condition or this instruction, always ask your healthcare professional. [place] any warranty or liability for your use of this information.         Learning About Diabetes Food Guidelines  Your Care Instructions     Meal planning is important to manage diabetes. It helps keep your blood sugar at a target level (which you set with your doctor). You don't have to eat special foods. You can eat what your family eats, including sweets once in a while. But you do have to pay attention to how often you eat and how much you eat of certain foods.  You may want to work with a  dietitian or a certified diabetes educator (CDE) to help you plan meals and snacks. A dietitian or CDE can also help you lose weight if that is one of your goals.  What should you know about eating carbs?  Managing the amount of carbohydrate (carbs) you eat is an important part of healthy meals when you have diabetes. Carbohydrate is found in many foods.  ?? Learn which foods have  carbs. And learn the amounts of carbs in different foods.  ? Bread, cereal, pasta, and rice have about 15 grams of carbs in a serving. A serving is 1 slice of bread (1 ounce), ?? cup of cooked cereal, or 1/3 cup of cooked pasta or rice.  ? Fruits have 15 grams of carbs in a serving. A serving is 1 small fresh fruit, such as an apple or orange; ?? of a banana; ?? cup of cooked or canned fruit; ?? cup of fruit juice; 1 cup of melon or raspberries; or 2 tablespoons of dried fruit.  ? Milk and no-sugar-added yogurt have 15 grams of carbs in a serving. A serving is 1 cup of milk or 2/3 cup of no-sugar-added yogurt.  ? Starchy vegetables have 15 grams of carbs in a serving. A serving is ?? cup of mashed potatoes or sweet potato; 1 cup winter squash; ?? of a small baked potato; ?? cup of cooked beans; or ?? cup cooked corn or green peas.  ?? Learn how much carbs to eat each day and at each meal. A dietitian or CDE can teach you how to keep track of the amount of carbs you eat. This is called carbohydrate counting.  ?? If you are not sure how to count carbohydrate grams, use the Plate Method to plan meals. It is a good, quick way to make sure that you have a balanced meal. It also helps you spread carbs throughout the day.  ? Divide your plate by types of foods. Put non-starchy vegetables on half the plate, meat or other protein food on one-quarter of the plate, and a grain or starchy vegetable in the final quarter of the plate. To this you can add a small piece of fruit and 1 cup of milk or yogurt, depending on how many carbs you are supposed to eat at a meal.  ?? Try to eat about the same amount of carbs at each meal. Do not "save up" your daily allowance of carbs to eat at one meal.  ?? Proteins have very little or no carbs per serving. Examples of proteins are beef, chicken, [place], fish, eggs, tofu, cheese, cottage cheese, and peanut butter. A serving size of meat is 3 ounces, which is about the size of a deck of cards. Examples  of meat substitute serving sizes (equal to 1 ounce of meat) are 1/4 cup of cottage cheese, 1 egg, 1 tablespoon of peanut butter, and ?? cup of tofu.  How can you eat out and still eat healthy?  ?? Learn to estimate the serving sizes of foods that have carbohydrate. If you measure food at home, it will be easier to estimate the amount in a serving of restaurant food.  ?? If the meal you order has too much carbohydrate (such as potatoes, corn, or baked beans), ask to have a low-carbohydrate food instead. Ask for a salad or green vegetables.  ?? If you use insulin, check your blood sugar before and after eating out to help you plan how much to eat in the future.  ??  If you eat more carbohydrate at a meal than you had planned, take a walk or do other exercise. This will help lower your blood sugar.  What else should you know?  ?? Limit saturated fat, such as the fat from meat and dairy products. This is a healthy choice because people who have diabetes are at higher risk of heart disease. So choose lean cuts of meat and nonfat or low-fat dairy products. Use olive or canola oil instead of butter or shortening when cooking.  ?? Don't skip meals. Your blood sugar may drop too low if you skip meals and take insulin or certain medicines for diabetes.  ?? Check with your doctor before you drink alcohol. Alcohol can cause your blood sugar to drop too low. Alcohol can also cause a bad reaction if you take certain diabetes medicines.  Follow-up care is a key part of your treatment and safety. Be sure to make and go to all appointments, and call your doctor if you are having problems. It's also a good idea to know your test results and keep a list of the medicines you take.  Where can you learn more?  Go to http://clayton-rivera.info/  Enter I147 in the search box to learn more about "Learning About Diabetes Food Guidelines."  Current as of: May 01, 2018               Content Version: 12.6  ?? 2006-2020 [employer].   Care instructions adapted under license by [employer] (which disclaims liability or warranty for this information). If you have questions about a medical condition or this instruction, always ask your healthcare professional. [place] any warranty or liability for your use of this information.

## 2019-09-01 NOTE — Progress Notes (Signed)
HPI:   [name] Wu is a 59 y.o. year old female who presents today for an acute visit. She has a history of acute lymphoblastic leukemia, s/p stem cell transplant, hypertension, hyperlipidemia, diabetes mellitus, peripheral neuropathy, asthma, GERD, insomnia, and osteoarthritis. She reports that she is doing reasonably well. She is continuing to follow with the [facility] in [place] and travels there every 3 months for exam, which is due in next few weeks. She reports that he has had difficulty with dizziness for the last 6 days which worsens when turning or changing position. She reports waxing and waning symptoms with associated nausea. She denies any tinnitus, ear pain, sore throat, fever, chills, sinus pressure, or worsening allergies. She denies any difficulty ambulating. She does report a history of severe motion sickness on cruises or when riding in the back of a car. She also reports that she completed her Moderna COVID 19 vaccine series. She is otherwise without complaints and feeling generally well.     She has a history of acute lymphoblastic leukemia, diagnosed in 02/2017 after experiencing severe fatigue and bone pain for several months. She was positive for the [place] chromosome mutation. She presented to the [facility] in [place] for care, and underwent chemotherapy which achieved remission. She subsequently underwent a stem cell transplant on 08/14/2017, felt to be curative. She is now on maintenance therapy with a tyrosine kinase inhibitor, nilotimib 200 mg bid. She is followed by Dr. Noemi Chapel , transplant oncologist, and travels to [facility] every three months for care as discussed. She is also followed locally by Dr. Edd Arbour.     He has a history of hypertension, treated with amlodipine. She has a history of hyperlipidemia, and was previously taking atorvastatin. However this was discontinued during her cancer treatment. She also has a history  of diabetes mellitus, currently being treated with [place] and Humalog. She denies any polyuria, polydipsia, nocturia, or blurry vision, and has no history of retinopathy or nephropathy. 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 She is currently under the care of pain management and is being treated with Cymbalta and Roxicodone as needed.     She has a history of asthma since childhood, and is being maintained on Flovent and albuterol as needed. She denies any cough or shortness of breath.     In 04/2018, she was admitted to [facility] from 12/4-12/10/2017 for a UTI and then subsequently readmitted from 12/6-12/17/2019 with MRSE bacteremia related to a PICC line and bibasilar pneumonia.     He has a history of GERD, treated with Protonix. She also underwent a screening colonoscopy in 05/2018 in [place]. Report unavailable. She denies any abdominal pain, nausea, vomiting, melena, hematochezia, or change in bowel movements.      Past Medical History:   Diagnosis Date   ??? ALL (acute lymphoblastic leukemia) ([place]) 02/2017    s/p stem cell transplant 08/2017; [facility] in [place]   ??? Diabetes ([place])    ??? GERD (gastroesophageal reflux disease) 03/18/2019   ??? 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 H/O stem cell transplant ([place]) 041/04/19   ??? Hyperlipidemia 03/22/2019   ??? Hypertension    ??? Insomnia 03/22/2019   ??? Mild intermittent asthma without complication 63/0/1601   ??? Peripheral neuropathy due to chemotherapy ([place]) 03/18/2019     No past surgical history on file.  Current Outpatient  Medications   Medication Sig   ??? diphenhydrAMINE (BenadryL) 25 mg capsule Take 25 mg by mouth every six (6) hours as needed.   ??? ZINC PO Take  by mouth.   ??? thiamine HCl (VITAMIN B-1 PO) Take  by mouth.   ??? ascorbic acid (VITAMIN C PO) Take  by mouth.   ??? meclizine (ANTIVERT) 25 mg tablet Take 1 Tab by mouth three (3) times daily as needed for  Dizziness for up to 10 days. Indications: sensation of spinning or whirling   ??? atorvastatin (LIPITOR) 20 mg tablet Take 1 Tab by mouth daily.   ??? BIOTIN PO Take 1 Tab by mouth daily.   ??? calcium carb/vit D3/minerals (CALTRATE 600+D PLUS MINERALS PO) Take  by mouth daily.   ??? amLODIPine (NORVASC) 5 mg tablet TAKE 1 TABLET BY MOUTH EVERY DAY   ??? OneTouch Ultra Blue Test Strip strip TEST 4 TIMES A DAY   ??? fluticasone propionate (Flovent HFA) 220 mcg/actuation inhaler Take 2 Puffs by inhalation two (2) times daily as needed.   ??? Nano Pen Needle 32 gauge x 5/32" ndle INJECT 4 TIMES A DAY WITH MEALS AND AT BEDTIME   ??? triamcinolone acetonide (KENALOG) 0.1 % topical cream APPLY TO AFFECTED AREA TWICE A DAY AS NEEDED FOR RASH   ??? levocetirizine dihydrochloride (XYZAL PO) Take  by mouth.   ??? RESVERATROL-QUERCETIN PO Take  by mouth.   ??? DULoxetine (CYMBALTA) 30 mg capsule TAKE 1 CAPSULE BY MOUTH TWICE A DAY   ??? albuterol (PROVENTIL VENTOLIN) 2.5 mg /3 mL (0.083 %) nebu 3 mL by Nebulization route every four (4) hours as needed (wheezing).   ??? magnesium oxide (MAG-OX) 400 mg tablet Take 800 mg by mouth two (2) times a day.   ??? pantoprazole (Protonix) 40 mg tablet Take 40 mg by mouth two (2) times a day.   ??? prochlorperazine (COMPAZINE) 10 mg tablet Take 10 mg by mouth every four (4) hours as needed.   ??? nilotinib (TASIGNA) 200 mg cap capsule Take 200 mg by mouth two (2) times a day.   ??? Magnesium Oxide-Mg AA Chelate (MG-PLUS) 133 mg tab Take 1 Tab by mouth two (2) times a day.   ??? cholecalciferol, VITAMIN D3, (VITAMIN D3) 5,000 unit tab tablet Take 5,000 Units by mouth daily.   ??? oxyCODONE IR (ROXICODONE) 5 mg immediate release tablet Take 2 Tabs by mouth every four (4) hours as needed. Max Daily Amount: 60 mg.   ??? acyclovir (ZOVIRAX) 400 mg tablet Take 800 mg by mouth two (2) times a day.   ??? insulin lispro (HUMALOG JUNIOR KWIKPEN U-100) 100 unit/mL inph by SubCUTAneous route three (3) times daily. 180-200 2 U; 201-220 4 U;  221-240 5 U; 241-280 6 U; 281-320 7 U; 321-400 8 U;  >400 8 U and call  Indications: sliding scale   ??? potassium chloride SR (K-TAB) 20 mEq tablet Take 40 mEq by mouth daily.   ??? insulin degludec (TRESIBA FLEXTOUCH U-200) 200 unit/mL (3 mL) inpn 30 Units by SubCUTAneous route daily.   ??? zolpidem (AMBIEN) 5 mg tablet Take 5 mg by mouth nightly.     No current facility-administered medications for this visit.      Allergies and Intolerances:   Allergies   Allergen Reactions   ??? Pcn [Penicillins] Hives and Itching     Family History: She has no family history of colon cancer of leukemia. Mother had breast cancer at the age of 57.  Family History   Problem Relation Age of Onset   ??? Breast Cancer Mother      Social History:   She  reports that she has never smoked. She has never used smokeless tobacco. She is divorced and has no children. She is a Optometrist, and working virtually due to the pandemic.     Social History     Substance and Sexual Activity   Alcohol Use No   ??? Frequency: Never     Immunization History:  Immunization History   Administered Date(s) Administered   ??? Covid-19, MODERNA, Mrna, Lnp-s, Pf, 14mg/0.5mL 06/16/2019, 07/14/2019   ??? Influenza Vaccine 02/27/2018, 02/23/2019   ??? Pneumococcal Polysaccharide (PPSV-23) 03/18/2019   ??? Tdap 03/18/2019       Review of Systems:   As above included in HPI.  Otherwise 11 point review of systems negative including constitutional, skin, HENT, eyes, respiratory, cardiovascular, gastrointestinal, genitourinary, musculoskeletal, endocrine, hematologic, allergy, and neurologic.    Physical:   Vitals:   BP: 112/74  HR: 82  WT: 203 lb (92.1 kg)  BMI:  29.50 kg/m2      Exam:   Patient appears in no apparent distress. Affect is appropriate.    HEENT: PERRLA, anicteric, bilateral ear canals clear without cerumen and normal TM reflex; no JVD; mild submandibular adenopathy; no thyromegaly. No carotid bruits or radiated murmur.  Lungs: clear to auscultation, no wheezes,  rhonchi, or rales.  Heart: regular rate and rhythm. No murmur, rubs, gallops  Abdomen: soft, nontender, nondistended, normal bowel sounds, no hepatosplenomegaly or masses.   Extremities: without edema.    Neuro: CN III-XII intact, no nystagmus        Review of Data:  Labs:  No visits with results within 2 Day(s) from this visit.   Latest known visit with results is:   Hospital Outpatient Visit on 06/17/2019   Component Date Value Ref Range Status   ??? Hemoglobin A1c 06/17/2019 7.1* 4.2 - 5.6 % Final   ??? Est. average glucose 06/17/2019 157  mg/dL Final   ??? LIPID PROFILE 06/17/2019        Final   ??? Cholesterol, total 06/17/2019 179  <200 MG/DL Final   ??? Triglyceride 06/17/2019 145  <150 MG/DL Final   ??? HDL Cholesterol 06/17/2019 63* 40 - 60 MG/DL Final   ??? LDL, calculated 06/17/2019 87  0 - 100 MG/DL Final   ??? VLDL, calculated 06/17/2019 29  MG/DL Final   ??? CHOL/HDL Ratio 06/17/2019 2.8  0 - 5.0   Final   ??? Magnesium 06/17/2019 2.0  1.6 - 2.6 mg/dL Final   ??? Sodium 06/17/2019 145  136 - 145 mmol/L Final   ??? Potassium 06/17/2019 4.1  3.5 - 5.5 mmol/L Final   ??? Chloride 06/17/2019 109  100 - 111 mmol/L Final   ??? CO2 06/17/2019 31  21 - 32 mmol/L Final   ??? Anion gap 06/17/2019 5  3.0 - 18 mmol/L Final   ??? Glucose 06/17/2019 159* 74 - 99 mg/dL Final   ??? BUN 06/17/2019 11  7.0 - 18 MG/DL Final   ??? Creatinine 06/17/2019 1.04  0.6 - 1.3 MG/DL Final   ??? BUN/Creatinine ratio 06/17/2019 11* 12 - 20   Final   ??? GFR est AA 06/17/2019 >60  >60 ml/min/1.778mFinal   ??? GFR est non-AA 06/17/2019 54* >60 ml/min/1.7330minal   ??? Calcium 06/17/2019 8.6  8.5 - 10.1 MG/DL Final   ??? Bilirubin, total 06/17/2019 0.9  0.2 -  1.0 MG/DL Final   ??? ALT (SGPT) 06/17/2019 25  13 - 56 U/L Final   ??? AST (SGOT) 06/17/2019 15  10 - 38 U/L Final   ??? Alk. phosphatase 06/17/2019 164* 45 - 117 U/L Final   ??? Protein, total 06/17/2019 6.5  6.4 - 8.2 g/dL Final   ??? Albumin 06/17/2019 3.6  3.4 - 5.0 g/dL Final   ??? Globulin 06/17/2019 2.9  2.0 - 4.0 g/dL Final    ??? A-G Ratio 06/17/2019 1.2  0.8 - 1.7   Final   ??? Vitamin D 25-Hydroxy 06/17/2019 38.4  30 - 100 ng/mL Final   ??? GGT 06/17/2019 81* 5 - 55 U/L Final       Calculated 10 year ASCVD risk score: 13.9 %    Health Maintenance:  Screening:    Mammogram: abnormal (05/12/2019) right mammo/ultrasound due 10/2019.   PAP smear: unknown   Colorectal: colonoscopy (05/2018) in [place].    Depression: none   DM (HbA1c/FPG): HbA1c 7.1 (06/2019)   Hepatitis C: negative (03/2019)   Falls: none   DEXA: unknown   Glaucoma: unknown   Smoking: none   Vitamin D: 42.8 (03/2019)   Medicare Wellness: due for Initial wellness viist        Impression:  Patient Active Problem List   Diagnosis Code   ??? 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[place]) C91.01   ??? S/P bone marrow transplant ([place]) Z94.81   ??? Essential hypertension I10   ??? Type 2 diabetes mellitus, with long-term current use of insulin ([facility]) E11.9, Z79.4   ??? Peripheral neuropathy due to chemotherapy ([place]) G62.0, T45.1X5A   ??? Mild intermittent asthma without complication W10.93   ??? GERD (gastroesophageal reflux disease) K21.9   ??? Eczema L30.9   ??? Insomnia G47.00   ??? 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 Hyperlipidemia E78.5   ??? Abnormal mammogram of right breast R92.8   ??? Class 1 obesity due to excess calories with serious comorbidity and body mass index (BMI) of 30.0 to 30.9 in adult E66.09, Z68.30   ??? Elevated alkaline phosphatase level R74.8       Plan:  Vestibular neuritis. Patient reports 6 day history of intermittent dizziness/ vertigo which worsened with position change. Associated with nausea. History of severe motion sickness on cruises and when sitting in the back seat of a car. Will treat with meclizine 25 mg tid prn. Discussed conservative measures and provided with handouts for Epley maneuver. If no improvement, advised to call and will proceed with balance therapy.     Other issues:  1. Acute lymphoblastic leukemia, +[place] chrom mutation, s/p  stem cell transplant. Diagnosed in 02/2017 and underwent induction chemotherapy followed by stem cell transplant in 08/2017. Now on maintenance therapy with a TKI (nilotinib). Being followed closely by Dr. Noemi Chapel, transplant oncologist, at [facility] in [place] with monthly blood draws and travel to [place] every 3 months for evaluation. Has also been followed locally by Dr. Edd Arbour. Due for travel to [place] next month.   2. Hypertension. Blood pressure well controlled on amlodipine 5 mg daily. Renal function remains normal with creatinine 1.04/ eGFR >60. Will continue to monitor. On potassium and magnesium supplements due to long term difficulty with deficiency. Stable levels when last checked in 06/2019. Will consider changing treatment to Ace-I/ARB given renoprotective effect in diabetes mellitus. Will address at next visit.   3. Hyperlipidemia. Calculated 10 year ASCVD risk is 13.9 %, which placed  her in one of the four statin benefit groups as per new AHA/ACC guidelines (primary prevention: 10 year ASCVD risk >7.5%). Patient also a diabetic. Started treatment with atorvastatin 20 mg daily in 03/2019, and repeat lipid panel with LDL 87 and HDL 63 in 06/2019. Emphasized importance of lifestyle modifications, including diet, exercise, and weight loss. Will recheck lipid panel at next visit. Continue to follow.  4. Diabetes mellitus. Previously well controlled on current regimen of Tresiba 30 units daily and SS Humalog with HbA1c 6.5 in 03/2019. However, increased to HbA1c 7.1 in 06/2019, likely related to weight gain. Reports blood sugars have been in the 200's and advised to titrate Tresiba by 2-3 units every 3 days to achieve fasting blood sugar of <150. Will reassess next visit. No evidence of microvascular complications. Started on statin but not on Ace-I. Referred to Dr. Faustino Congress for eye exam, but no-showed for appointment. Foot exam grossly normal today, and no evidence of  microalbuminuria with urine microalbumin/ creatinine ratio 18 mg/g. Emphasized importance of lifestyle modifications, including diet, exercise, and weight loss.   5. Peripheral neuropathy, painful. Thought to be secondary to chemotherapy. On Cymbalta 30 mg bid and Roxicodone prn through pain management. Did not tolerate gabapentin. Follow.   6. Asthma, mild intermittent. On Flovent prn and albuterol as needed. Will use when symptoms increase. Currently stable.  7. GERD. On Protonix 40 mg bid with reasonably good control. Follow.  8. Abnormal mammogram. Patient reports had previous biopsy of right breast which was benign. Screening mammogram (02/2019) showed multiple abnormalities in right breast. Right breast diagnostic mammogram and ultrasound (05/12/2019) showed probably benign findings, but recommendation to proceed with short interval 6 month follow-up (due 10/2019). Will place orders.  Patient with FH history of breast cancer in her mother.  9. Left trigger thumb. Evaluated by Dr. Redmond Pulling in 04/2019 and received a cortisone injection with improvement.   10. Elevated alkaline phosphatase. Unclear if related to transplant and bone marrow process. Will request records to see if has been elevated previously. However, GGT elevated at 81 in 06/17/2019 so likely of hepatic origin. Also, will check AMA and alkaline phosphatase isozymes at next visit, and discuss proceeding with right upper quadrant ultrasound.    11. Insomnia. Patient using Ambien +/- Benadryl as needed. Will emphasize good sleep hygiene.   12. Eczema. On Xyzal and will use Kenalog as needed.   13. Mild submandibular adenopathy. Patient reports that having dental issues in lower molars needing root canal +/- extraction. Will monitor.   14. Obesity. Weight increased nearly 10 pounds since 03/2019. BMI now >30. Will emphasize importance of lifestyle modifications, including diet, exercise, and weight loss.  15. Health maintenance. Completed Moderna COVID 19  vaccine series. Completed Pneumovax 23 and Tdap. Given script for Shingrix vaccine but not yet obtained. Mammogram as discussed. Will attempt to request colonoscopy report. Referred to Dr. Charlyne Mom for well woman exam and pap smear. Will address if kept appointment. Referred to Dr. Faustino Congress for eye exams, but patient no-showed for appointment. Will readdress at next visit.  Vitamin D level normal. Continue maintenance dose supplement. Emphasized importance of lifestyle modifications, including diet, exercise, and weight loss. Due for an Initial Medicare Wellness visit and will perform at next visit.    Patient understands recommendations and agrees with plan.  Follow-up in 6 weeks for physical.

## 2019-09-16 ENCOUNTER — Telehealth: Attending: Internal Medicine | Primary: Internal Medicine

## 2019-09-16 ENCOUNTER — Telehealth: Admit: 2019-09-16 | Discharge: 2019-09-16 | Payer: MEDICARE | Attending: Internal Medicine | Primary: Internal Medicine

## 2019-09-16 DIAGNOSIS — J4521 Mild intermittent asthma with (acute) exacerbation: Secondary | ICD-10-CM

## 2019-09-16 MED ORDER — FLUTICASONE 220 MCG/ACTUATION AEROSOL INHALER
220 mcg/actuation | Freq: Two times a day (BID) | RESPIRATORY_TRACT | 3 refills | Status: DC
Start: 2019-09-16 — End: 2019-10-06

## 2019-09-16 MED ORDER — ALBUTEROL SULFATE HFA 90 MCG/ACTUATION AEROSOL INHALER
90 mcg/actuation | RESPIRATORY_TRACT | 3 refills | Status: DC | PRN
Start: 2019-09-16 — End: 2021-04-13

## 2019-09-16 MED ORDER — BENZONATATE 100 MG CAP
100 mg | ORAL_CAPSULE | Freq: Three times a day (TID) | ORAL | 0 refills | Status: AC | PRN
Start: 2019-09-16 — End: 2019-09-26

## 2019-09-16 MED ORDER — PREDNISONE 20 MG TAB
20 mg | ORAL_TABLET | Freq: Every day | ORAL | 0 refills | Status: AC
Start: 2019-09-16 — End: 2019-09-21

## 2019-09-16 NOTE — Progress Notes (Signed)
[name] Wu is a 59 y.o. female who was seen by synchronous (real-time) audio-video technology on 09/16/2019.    HPI:   [name] Wu is a 59 y.o. year old female who presents today for an acute visit. She has a history of acute lymphoblastic leukemia, s/p stem cell transplant, hypertension, hyperlipidemia, diabetes mellitus, peripheral neuropathy, asthma, GERD, insomnia, and osteoarthritis. She is continuing to follow with the [facility] in [place] and travels there every 3 months for exam. She states that she just returned from a trip and upon arrival home, noted the onset of sore throat, nasal congestion, cough, and right ear pain. She denies experiencing any fever or chills. She reports that as her URI symptoms improved, her cough persisted so that it was now difficult to speak in full sentences without coughing. She denies dyspnea except with coughing, and reports post-nasal drainage which is exacerbating the problem. She has been using her albuterol inhaler, but had not restarted Flovent. She reports that her vertigo has now completely resolved after using meclizine. She also reports that she has increased her Tresiba to 33 units, but her blood sugars still remain around 200. She is otherwise without complaints and feeling generally well.     She has a history of acute lymphoblastic leukemia, diagnosed in 02/2017 after experiencing severe fatigue and bone pain for several months. She was positive for the [place] chromosome mutation. She presented to the [facility] in [place] for care, and underwent chemotherapy which achieved remission. She subsequently underwent a stem cell transplant on 08/14/2017, felt to be curative. She is now on maintenance therapy with a tyrosine kinase inhibitor, nilotimib 200 mg bid. She is followed by Dr. Noemi Chapel , transplant oncologist, and travels to [facility] every three months for care as discussed. She is also followed  locally by Dr. Edd Arbour.     He has a history of hypertension, treated with amlodipine. She has a history of hyperlipidemia, and was previously taking atorvastatin. However this was discontinued during her cancer treatment. She also has a history of diabetes mellitus, currently being treated with [place] and Humalog. She denies any polyuria, polydipsia, nocturia, or blurry vision, and has no history of retinopathy or nephropathy. 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 She is currently under the care of pain management and is being treated with Cymbalta and Roxicodone as needed.     She has a history of asthma since childhood, and is being maintained on Flovent and albuterol as needed. She denies any cough or shortness of breath.     In 04/2018, she was admitted to [facility] from 12/4-12/10/2017 for a UTI and then subsequently readmitted from 12/6-12/17/2019 with MRSE bacteremia related to a PICC line and bibasilar pneumonia.     He has a history of GERD, treated with Protonix. She also underwent a screening colonoscopy in 05/2018 in [place]. Report unavailable. She denies any abdominal pain, nausea, vomiting, melena, hematochezia, or change in bowel movements.      Past Medical History:   Diagnosis Date   ??? ALL (acute lymphoblastic leukemia) ([place]) 02/2017    s/p stem cell transplant 08/2017; [place] in [place]   ??? Diabetes ([place])    ??? GERD (gastroesophageal reflux disease) 03/18/2019   ??? 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 H/O stem cell transplant ([place]) 041/04/19   ??? Hyperlipidemia 03/22/2019   ???  Hypertension    ??? Insomnia 03/22/2019   ??? Mild intermittent asthma without complication 38/08/5362   ??? Peripheral neuropathy due to chemotherapy ([place]) 03/18/2019     No past surgical history on file.  Current Outpatient Medications   Medication Sig   ??? predniSONE (DELTASONE) 20 mg tablet Take 40 mg by mouth daily (with  breakfast) for 5 days.   ??? benzonatate (Tessalon Perles) 100 mg capsule Take 1 Cap by mouth three (3) times daily as needed for Cough for up to 10 days.   ??? fluticasone propionate (Flovent HFA) 220 mcg/actuation inhaler Take 2 Puffs by inhalation two (2) times a day. Indications: controller medication for asthma   ??? albuterol (PROVENTIL HFA, VENTOLIN HFA, PROAIR HFA) 90 mcg/actuation inhaler Take 1 Puff by inhalation every four (4) hours as needed for Wheezing or Cough.   ??? diphenhydrAMINE (BenadryL) 25 mg capsule Take 25 mg by mouth every six (6) hours as needed.   ??? ZINC PO Take  by mouth.   ??? thiamine HCl (VITAMIN B-1 PO) Take  by mouth.   ??? ascorbic acid (VITAMIN C PO) Take  by mouth.   ??? atorvastatin (LIPITOR) 20 mg tablet Take 1 Tab by mouth daily.   ??? BIOTIN PO Take 1 Tab by mouth daily.   ??? calcium carb/vit D3/minerals (CALTRATE 600+D PLUS MINERALS PO) Take  by mouth daily.   ??? amLODIPine (NORVASC) 5 mg tablet TAKE 1 TABLET BY MOUTH EVERY DAY   ??? OneTouch Ultra Blue Test Strip strip TEST 4 TIMES A DAY   ??? Nano Pen Needle 32 gauge x 5/32" ndle INJECT 4 TIMES A DAY WITH MEALS AND AT BEDTIME   ??? triamcinolone acetonide (KENALOG) 0.1 % topical cream APPLY TO AFFECTED AREA TWICE A DAY AS NEEDED FOR RASH   ??? levocetirizine dihydrochloride (XYZAL PO) Take  by mouth.   ??? RESVERATROL-QUERCETIN PO Take  by mouth.   ??? DULoxetine (CYMBALTA) 30 mg capsule TAKE 1 CAPSULE BY MOUTH TWICE A DAY   ??? albuterol (PROVENTIL VENTOLIN) 2.5 mg /3 mL (0.083 %) nebu 3 mL by Nebulization route every four (4) hours as needed (wheezing).   ??? magnesium oxide (MAG-OX) 400 mg tablet Take 800 mg by mouth two (2) times a day.   ??? pantoprazole (Protonix) 40 mg tablet Take 40 mg by mouth two (2) times a day.   ??? prochlorperazine (COMPAZINE) 10 mg tablet Take 10 mg by mouth every four (4) hours as needed.   ??? nilotinib (TASIGNA) 200 mg cap capsule Take 200 mg by mouth two (2) times a day.   ??? Magnesium Oxide-Mg AA Chelate (MG-PLUS) 133 mg tab  Take 1 Tab by mouth two (2) times a day.   ??? cholecalciferol, VITAMIN D3, (VITAMIN D3) 5,000 unit tab tablet Take 5,000 Units by mouth daily.   ??? oxyCODONE IR (ROXICODONE) 5 mg immediate release tablet Take 2 Tabs by mouth every four (4) hours as needed. Max Daily Amount: 60 mg.   ??? acyclovir (ZOVIRAX) 400 mg tablet Take 800 mg by mouth two (2) times a day.   ??? insulin lispro (HUMALOG JUNIOR KWIKPEN U-100) 100 unit/mL inph by SubCUTAneous route three (3) times daily. 180-200 2 U; 201-220 4 U; 221-240 5 U; 241-280 6 U; 281-320 7 U; 321-400 8 U;  >400 8 U and call  Indications: sliding scale   ??? potassium chloride SR (K-TAB) 20 mEq tablet Take 40 mEq by mouth daily.   ??? insulin degludec (TRESIBA FLEXTOUCH U-200)  200 unit/mL (3 mL) inpn 33 Units by SubCUTAneous route daily.   ??? zolpidem (AMBIEN) 5 mg tablet Take 5 mg by mouth nightly.     No current facility-administered medications for this visit.      Allergies and Intolerances:   Allergies   Allergen Reactions   ??? Pcn [Penicillins] Hives and Itching     Family History: She has no family history of colon cancer of leukemia. Mother had breast cancer at the age of 56.   Family History   Problem Relation Age of Onset   ??? Breast Cancer Mother      Social History:   She  reports that she has never smoked. She has never used smokeless tobacco. She is divorced and has no children. She is a Optometrist, and working virtually due to the pandemic.     Social History     Substance and Sexual Activity   Alcohol Use No   ??? Frequency: Never     Immunization History:  Immunization History   Administered Date(s) Administered   ??? Covid-19, MODERNA, Mrna, Lnp-s, Pf, 139mg/0.5mL 06/16/2019, 07/14/2019   ??? Influenza Vaccine 02/27/2018, 02/23/2019   ??? Pneumococcal Polysaccharide (PPSV-23) 03/18/2019   ??? Tdap 03/18/2019       Review of Systems:   As above included in HPI.  Otherwise 11 point review of systems negative including constitutional, skin, HENT, eyes, respiratory,  cardiovascular, gastrointestinal, genitourinary, musculoskeletal, endocrine, hematologic, allergy, and neurologic.    Physical:   There were no vitals taken for this visit.      Constitutional: [x] Appears well-developed and well-nourished [x] No apparent distress      [] Abnormal -     Mental status: [x] Alert and awake  [x] Oriented to person/place/time [x] Able to follow commands    [] Abnormal -     Eyes:   EOM    [x]  Normal    [] Abnormal -   Sclera  [x]  Normal    [] Abnormal -          Discharge [x]  None visible   [] Abnormal -     HENT: [x] Normocephalic, atraumatic  [] Abnormal -   [x] Mouth/Throat: Mucous membranes are moist    External Ears [x] Normal  [] Abnormal -    Neck: [x] No visualized mass [] Abnormal -     Pulmonary/Chest: [x] Respiratory effort normal   [x] No visualized signs of difficulty breathing or respiratory distress        [x] Abnormal -  coughing while speaking     Musculoskeletal:   [x] Normal gait with no signs of ataxia         [x] Normal range of motion of neck        [] Abnormal -     Neurological:        [x] No Facial Asymmetry (Cranial nerve 7 motor function) (limited exam due to video visit)          [x] No gaze palsy        [] Abnormal -          Skin:        [x] No significant exanthematous lesions or discoloration noted on facial skin         [] Abnormal -            Psychiatric:       [x] Normal Affect [] Abnormal -        [  x] No Hallucinations              Review of Data:  Labs:  No visits with results within 2 Day(s) from this visit.   Latest known visit with results is:   Hospital Outpatient Visit on 06/17/2019   Component Date Value Ref Range Status   ??? Hemoglobin A1c 06/17/2019 7.1* 4.2 - 5.6 % Final   ??? Est. average glucose 06/17/2019 157  mg/dL Final   ??? LIPID PROFILE 06/17/2019        Final   ??? Cholesterol, total 06/17/2019 179  <200 MG/DL Final   ??? Triglyceride 06/17/2019 145  <150 MG/DL Final   ??? HDL Cholesterol 06/17/2019 63* 40 - 60 MG/DL Final   ??? LDL,  calculated 06/17/2019 87  0 - 100 MG/DL Final   ??? VLDL, calculated 06/17/2019 29  MG/DL Final   ??? CHOL/HDL Ratio 06/17/2019 2.8  0 - 5.0   Final   ??? Magnesium 06/17/2019 2.0  1.6 - 2.6 mg/dL Final   ??? Sodium 06/17/2019 145  136 - 145 mmol/L Final   ??? Potassium 06/17/2019 4.1  3.5 - 5.5 mmol/L Final   ??? Chloride 06/17/2019 109  100 - 111 mmol/L Final   ??? CO2 06/17/2019 31  21 - 32 mmol/L Final   ??? Anion gap 06/17/2019 5  3.0 - 18 mmol/L Final   ??? Glucose 06/17/2019 159* 74 - 99 mg/dL Final   ??? BUN 06/17/2019 11  7.0 - 18 MG/DL Final   ??? Creatinine 06/17/2019 1.04  0.6 - 1.3 MG/DL Final   ??? BUN/Creatinine ratio 06/17/2019 11* 12 - 20   Final   ??? GFR est AA 06/17/2019 >60  >60 ml/min/1.55m Final   ??? GFR est non-AA 06/17/2019 54* >60 ml/min/1.756mFinal   ??? Calcium 06/17/2019 8.6  8.5 - 10.1 MG/DL Final   ??? Bilirubin, total 06/17/2019 0.9  0.2 - 1.0 MG/DL Final   ??? ALT (SGPT) 06/17/2019 25  13 - 56 U/L Final   ??? AST (SGOT) 06/17/2019 15  10 - 38 U/L Final   ??? Alk. phosphatase 06/17/2019 164* 45 - 117 U/L Final   ??? Protein, total 06/17/2019 6.5  6.4 - 8.2 g/dL Final   ??? Albumin 06/17/2019 3.6  3.4 - 5.0 g/dL Final   ??? Globulin 06/17/2019 2.9  2.0 - 4.0 g/dL Final   ??? A-G Ratio 06/17/2019 1.2  0.8 - 1.7   Final   ??? Vitamin D 25-Hydroxy 06/17/2019 38.4  30 - 100 ng/mL Final   ??? GGT 06/17/2019 81* 5 - 55 U/L Final       Calculated 10 year ASCVD risk score: 13.9 %    Health Maintenance:  Screening:    Mammogram: abnormal (05/12/2019) right mammo/ultrasound due 10/2019.   PAP smear: unknown   Colorectal[place]   Depression: none   DM (HbA1c/FPG): HbA1c 7.1 (06/2019)   Hepatitis C: negative (03/2019)   Falls: none   DEXA: unknown   Glaucoma: unknown   Smoking: none   Vitamin D: 42.8 (03/2019)   Medicare Wellness: due for Initial wellness viist        Impression:  Patient Active Problem List   Diagnosis Code   ??? 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HCSan JuanC91.01   ??? S/P bone marrow transplant  ([id number]   ??? Essential hypertension I10   ??? Type 2 diabetes mellitus, with long-term current use of insulin ([facility]) E11.9, Z79.4   ???  Peripheral neuropathy due to chemotherapy ([place]) G62.0, T45.1X5A   ??? Mild intermittent asthma with acute exacerbation J45.21   ??? GERD (gastroesophageal reflux disease) K21.9   ??? Eczema L30.9   ??? Insomnia G47.00   ??? 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 Hyperlipidemia E78.5   ??? Abnormal mammogram of right breast R92.8   ??? Class 1 obesity due to excess calories with serious comorbidity and body mass index (BMI) of 30.0 to 30.9 in adult E66.09, Z68.30   ??? Elevated alkaline phosphatase level R74.8   ??? 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 Patient returned from [place] with a sore throat, nasal congestion, cough, and right ear pain. She denied experiencing any fever or chills. She reports that as her URI symptoms improved, her cough persisted so that it was now difficult to speak without coughing. Denies dyspnea except with coughing, and reports post-nasal drainage which is exacerbating the problem. Has been using her albuterol inhaler, but has not restarted Flovent. Noticeably coughing during visit while attempting to answer questions. Will treat with prednisone 40 mg daily x 5 days, advised to monitor blood sugars closely and supplement with Humalog as needed. Will also prescribe Tessalon Perles. Advised to restart Flovent inhaler after completing prednisone taper. Will also refill Flovent and albuterol inhalers today. Advised to present to ED for acute worsening, and advised to call if symptoms persist after completion of prednisone. Will follow.       Vestibular neuritis. Patient reported 6 day history of intermittent dizziness/ vertigo which worsened with position change. Associated with nausea. History of severe motion sickness on cruises and when sitting in the back seat of a car. Treated with meclizine 25  mg tid prn and provided with handouts for Epley maneuver. Reports improvement with complete resolution today. Will continue to monitor.    Other issues:  1. Acute lymphoblastic leukemia, +philadelphia chrom mutation, s/p stem cell transplant. Diagnosed in 02/2017 and underwent induction chemotherapy followed by stem cell transplant in 08/2017. Now on maintenance therapy with a TKI (nilotinib). Being followed closely by Dr. Noemi Chapel, transplant oncologist, at [facility] in [place] with monthly blood draws and travel to [place] every 3 months for evaluation. Has also been followed locally by Dr. Edd Arbour. Completed travel to [place] last week with good report.   2. Hypertension. Blood pressure well controlled on amlodipine 5 mg daily. Renal function remains normal with creatinine 1.04/ eGFR >60. Will continue to monitor. On potassium and magnesium supplements due to long term difficulty with deficiency. Stable levels when last checked in 06/2019. Will consider changing treatment to Ace-I/ARB given renoprotective effect in diabetes mellitus. Will address at next visit.   3. Hyperlipidemia. Calculated 10 year ASCVD risk is 13.9 %, which placed her in one of the four statin benefit groups as per new AHA/ACC guidelines (primary prevention: 10 year ASCVD risk >7.5%). Patient also a diabetic. Started treatment with atorvastatin 20 mg daily in 03/2019, and repeat lipid panel with LDL 87 and HDL 63 in 06/2019. Emphasized importance of lifestyle modifications, including diet, exercise, and weight loss. Will recheck lipid panel at next visit. Continue to follow.  4. Diabetes mellitus. Previously well controlled on regimen of Tresiba 30 units daily and SS Humalog with HbA1c 6.5 in 03/2019. However, increased to HbA1c 7.1 in 06/2019, likely related to weight gain. Reported blood sugars had been in the 200's at her prior visit on 09/01/2019, and  advised to titrate [place] by 2-3 units every 3 days to achieve  fasting blood sugar of <150. Reports currently using Tresiba 33 U with some improvement in blood sugars. Advised to continue with titration. Will reassess next visit. Will consider addition of GLP-1 agonists at next visit. No evidence of microvascular complications. Started on statin but not on Ace-I. Referred to Dr. Faustino Congress for eye exam, but no-showed for appointment. Foot exam grossly normal (08/2019), and no evidence of microalbuminuria with urine microalbumin/ creatinine ratio 18 mg/g. Emphasized importance of lifestyle modifications, including diet, exercise, and weight loss.   5. Peripheral neuropathy, painful. Thought to be secondary to chemotherapy. On Cymbalta 30 mg bid and Roxicodone prn through pain management. Did not tolerate gabapentin. Follow.   6. Asthma, mild intermittent. Previously treated with Flovent for maintenance therapy, but had been using only as needed. Advised to restart as discussed above given current exacerbation. Continue albuterol as needed. Will use when symptoms increase.   7. GERD. On Protonix 40 mg bid with reasonably good control. Follow.  8. Abnormal mammogram. Patient reports had previous biopsy of right breast which was benign. Screening mammogram (02/2019) showed multiple abnormalities in right breast. Right breast diagnostic mammogram and ultrasound (05/12/2019) showed probably benign findings, but recommendation to proceed with short interval 6 month follow-up (due 10/2019). Will place orders.  Patient with FH history of breast cancer in her mother.  9. Left trigger thumb. Evaluated by Dr. Redmond Pulling in 04/2019 and received a cortisone injection with improvement.   10. Elevated alkaline phosphatase. Unclear if related to transplant and bone marrow process. Will request records to see if has been elevated previously. However, GGT elevated at 81 in 06/17/2019 so likely of hepatic origin. Also, will check AMA and alkaline phosphatase isozymes at next visit, and discuss proceeding  with right upper quadrant ultrasound.    11. Insomnia. Patient using Ambien +/- Benadryl as needed. Will emphasize good sleep hygiene.   12. Eczema. On Xyzal and will use Kenalog as needed.   13. Mild submandibular adenopathy. Patient reports that having dental issues in lower molars needing root canal +/- extraction. Will monitor.   14. Obesity. Weight increased nearly 10 pounds since 03/2019. BMI now >30. Will emphasize importance of lifestyle modifications, including diet, exercise, and weight loss.  15. Health maintenance. Completed Moderna COVID 19 vaccine series. Completed Pneumovax 23 and Tdap. Given script for Shingrix vaccine but not yet obtained. Mammogram as discussed. Will attempt to request colonoscopy report. Referred to Dr. Charlyne Mom for well woman exam and pap smear. Will address if kept appointment. Referred to Dr. Faustino Congress for eye exams, but patient no-showed for appointment. Will readdress at next visit.  Vitamin D level normal. Continue maintenance dose supplement. Emphasized importance of lifestyle modifications, including diet, exercise, and weight loss. Due for an Initial Medicare Wellness visit and will perform at next visit.    Patient understands recommendations and agrees with plan.  Follow-up as previously scheduled for physical.       We discussed the expected course, resolution and complications of the diagnosis(es) in detail.  Medication risks, benefits, costs, interactions, and alternatives were discussed as indicated.  I advised her to contact the office if her condition worsens, changes or fails to improve as anticipated. She expressed understanding with the diagnosis(es) and plan.     Blenda Bridegroom, was evaluated through a synchronous (real-time) audio-video encounter. The patient (or guardian if applicable) is aware that this is a billable service. Verbal consent to proceed  has been obtained within the past 12 months. The visit was conducted pursuant to the emergency  declaration under the [place], [place] waiver authority and the [employer] Act.  Patient identification was verified, and a caregiver was present when appropriate. The patient was located in a state where the provider was credentialed to provide care.    Blanch Media, MD

## 2019-09-16 NOTE — Progress Notes (Signed)
[name] Wu is a 59 y.o. female who was seen by synchronous (real-time) audio-video technology on 09/16/2019.    HPI:   [name] Wu is a 59 y.o. year old female who presents today for an acute visit. She has a history of acute lymphoblastic leukemia, s/p stem cell transplant, hypertension, hyperlipidemia, diabetes mellitus, peripheral neuropathy, asthma, GERD, insomnia, and osteoarthritis. She is continuing to follow with the [facility] in [place] and travels there every 3 months for exam. She states that she just returned from a trip and upon arrival home, noted the onset of sore throat, nasal congestion, cough, and right ear pain. She denies experiencing any fever or chills. She reports that as her URI symptoms improved, her cough persisted so that it was now difficult to speak in full sentences without coughing. She denies dyspnea except with coughing, and reports post-nasal drainage which is exacerbating the problem. She has been using her albuterol inhaler, but had not restarted Flovent. She reports that her vertigo has now completely resolved after using meclizine. She also reports that she has increased her Tresiba to 33 units, but her blood sugars still remain around 200. She is otherwise without complaints and feeling generally well.     She has a history of acute lymphoblastic leukemia, diagnosed in 02/2017 after experiencing severe fatigue and bone pain for several months. She was positive for the Philadelphia chromosome mutation. She presented to the [facility] in [place] for care, and underwent chemotherapy which achieved remission. She subsequently underwent a stem cell transplant on 08/14/2017, felt to be curative. She is now on maintenance therapy with a tyrosine kinase inhibitor, nilotimib 200 mg bid. She is followed by Dr. Istvan Redei , transplant oncologist, and travels to [place] every three months for care as discussed. She is also followed  locally by Dr. Tan.     He has a history of hypertension, treated with amlodipine. She has a history of hyperlipidemia, and was previously taking atorvastatin. However this was discontinued during her cancer treatment. She also has a history of diabetes mellitus, currently being treated with Tresiba and Humalog. She denies any polyuria, polydipsia, nocturia, or blurry vision, and has no history of retinopathy or nephropathy. 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 She is currently under the care of pain management and is being treated with Cymbalta and Roxicodone as needed.     She has a history of asthma since childhood, and is being maintained on Flovent and albuterol as needed. She denies any cough or shortness of breath.     In 04/2018, she was admitted to [facility] from 12/4-12/10/2017 for a UTI and then subsequently readmitted from 12/6-12/17/2019 with MRSE bacteremia related to a PICC line and bibasilar pneumonia.     He has a history of GERD, treated with Protonix. She also underwent a screening colonoscopy in 05/2018 in [place]. Report unavailable. She denies any abdominal pain, nausea, vomiting, melena, hematochezia, or change in bowel movements.      Past Medical History:   Diagnosis Date   ??? ALL (acute lymphoblastic leukemia) (HCC) 02/2017    s/p stem cell transplant 08/2017; [facility] in [place]   ??? Diabetes (HCC)    ??? GERD (gastroesophageal reflux disease) 03/18/2019   ??? 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 H/O stem cell transplant (HCC) 041/04/19   ??? Hyperlipidemia 03/22/2019   ???   Hypertension    ??? Insomnia 03/22/2019   ??? Mild intermittent asthma without complication 03/18/2019   ??? 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  Current Outpatient Medications   Medication Sig   ??? predniSONE (DELTASONE) 20 mg tablet Take 40 mg by mouth daily (with  breakfast) for 5 days.   ??? benzonatate (Tessalon Perles) 100 mg capsule Take 1 Cap by mouth three (3) times daily as needed for Cough for up to 10 days.   ??? fluticasone propionate (Flovent HFA) 220 mcg/actuation inhaler Take 2 Puffs by inhalation two (2) times a day. Indications: controller medication for asthma   ??? albuterol (PROVENTIL HFA, VENTOLIN HFA, PROAIR HFA) 90 mcg/actuation inhaler Take 1 Puff by inhalation every four (4) hours as needed for Wheezing or Cough.   ??? diphenhydrAMINE (BenadryL) 25 mg capsule Take 25 mg by mouth every six (6) hours as needed.   ??? ZINC PO Take  by mouth.   ??? thiamine HCl (VITAMIN B-1 PO) Take  by mouth.   ??? ascorbic acid (VITAMIN C PO) Take  by mouth.   ??? atorvastatin (LIPITOR) 20 mg tablet Take 1 Tab by mouth daily.   ??? BIOTIN PO Take 1 Tab by mouth daily.   ??? calcium carb/vit D3/minerals (CALTRATE 600+D PLUS MINERALS PO) Take  by mouth daily.   ??? amLODIPine (NORVASC) 5 mg tablet TAKE 1 TABLET BY MOUTH EVERY DAY   ??? OneTouch Ultra Blue Test Strip strip TEST 4 TIMES A DAY   ??? Nano Pen Needle 32 gauge x 5/32" ndle INJECT 4 TIMES A DAY WITH MEALS AND AT BEDTIME   ??? triamcinolone acetonide (KENALOG) 0.1 % topical cream APPLY TO AFFECTED AREA TWICE A DAY AS NEEDED FOR RASH   ??? levocetirizine dihydrochloride (XYZAL PO) Take  by mouth.   ??? RESVERATROL-QUERCETIN PO Take  by mouth.   ??? DULoxetine (CYMBALTA) 30 mg capsule TAKE 1 CAPSULE BY MOUTH TWICE A DAY   ??? albuterol (PROVENTIL VENTOLIN) 2.5 mg /3 mL (0.083 %) nebu 3 mL by Nebulization route every four (4) hours as needed (wheezing).   ??? magnesium oxide (MAG-OX) 400 mg tablet Take 800 mg by mouth two (2) times a day.   ??? pantoprazole (Protonix) 40 mg tablet Take 40 mg by mouth two (2) times a day.   ??? prochlorperazine (COMPAZINE) 10 mg tablet Take 10 mg by mouth every four (4) hours as needed.   ??? nilotinib (TASIGNA) 200 mg cap capsule Take 200 mg by mouth two (2) times a day.   ??? Magnesium Oxide-Mg AA Chelate (MG-PLUS) 133 mg tab  Take 1 Tab by mouth two (2) times a day.   ??? cholecalciferol, VITAMIN D3, (VITAMIN D3) 5,000 unit tab tablet Take 5,000 Units by mouth daily.   ??? oxyCODONE IR (ROXICODONE) 5 mg immediate release tablet Take 2 Tabs by mouth every four (4) hours as needed. Max Daily Amount: 60 mg.   ??? acyclovir (ZOVIRAX) 400 mg tablet Take 800 mg by mouth two (2) times a day.   ??? insulin lispro (HUMALOG JUNIOR KWIKPEN U-100) 100 unit/mL inph by SubCUTAneous route three (3) times daily. 180-200 2 U; 201-220 4 U; 221-240 5 U; 241-280 6 U; 281-320 7 U; 321-400 8 U;  >400 8 U and call  Indications: sliding scale   ??? potassium chloride SR (K-TAB) 20 mEq tablet Take 40 mEq by mouth daily.   ??? insulin degludec (TRESIBA FLEXTOUCH U-200)   200 unit/mL (3 mL) inpn 33 Units by SubCUTAneous route daily.   ??? zolpidem (AMBIEN) 5 mg tablet Take 5 mg by mouth nightly.     No current facility-administered medications for this visit.      Allergies and Intolerances:   Allergies   Allergen Reactions   ??? Pcn [Penicillins] Hives and Itching     Family History: She has no family history of colon cancer of leukemia. Mother had breast cancer at the age of 68.   Family History   Problem Relation Age of Onset   ??? Breast Cancer Mother      Social History:   She  reports that she has never smoked. She has never used smokeless tobacco. She is divorced and has no children. She is a consultant, and working virtually due to the pandemic.     Social History     Substance and Sexual Activity   Alcohol Use No   ??? Frequency: Never     Immunization History:  Immunization History   Administered Date(s) Administered   ??? Covid-19, MODERNA, Mrna, Lnp-s, Pf, 100mcg/0.5mL 06/16/2019, 07/14/2019   ??? Influenza Vaccine 02/27/2018, 02/23/2019   ??? Pneumococcal Polysaccharide (PPSV-23) 03/18/2019   ??? Tdap 03/18/2019       Review of Systems:   As above included in HPI.  Otherwise 11 point review of systems negative including constitutional, skin, HENT, eyes, respiratory,  cardiovascular, gastrointestinal, genitourinary, musculoskeletal, endocrine, hematologic, allergy, and neurologic.    Physical:   There were no vitals taken for this visit.      Constitutional: [x] Appears well-developed and well-nourished [x] No apparent distress      [] Abnormal -     Mental status: [x] Alert and awake  [x] Oriented to person/place/time [x] Able to follow commands    [] Abnormal -     Eyes:   EOM    [x]  Normal    [] Abnormal -   Sclera  [x]  Normal    [] Abnormal -          Discharge [x]  None visible   [] Abnormal -     HENT: [x] Normocephalic, atraumatic  [] Abnormal -   [x] Mouth/Throat: Mucous membranes are moist    External Ears [x] Normal  [] Abnormal -    Neck: [x] No visualized mass [] Abnormal -     Pulmonary/Chest: [x] Respiratory effort normal   [x] No visualized signs of difficulty breathing or respiratory distress        [x] Abnormal -  coughing while speaking     Musculoskeletal:   [x] Normal gait with no signs of ataxia         [x] Normal range of motion of neck        [] Abnormal -     Neurological:        [x] No Facial Asymmetry (Cranial nerve 7 motor function) (limited exam due to video visit)          [x] No gaze palsy        [] Abnormal -          Skin:        [x] No significant exanthematous lesions or discoloration noted on facial skin         [] Abnormal -            Psychiatric:       [x] Normal Affect [] Abnormal -        [  x] No Hallucinations              Review of Data:  Labs:  No visits with results within 2 Day(s) from this visit.   Latest known visit with results is:   Hospital Outpatient Visit on 06/17/2019   Component Date Value Ref Range Status   ??? Hemoglobin A1c 06/17/2019 7.1* 4.2 - 5.6 % Final   ??? Est. average glucose 06/17/2019 157  mg/dL Final   ??? LIPID PROFILE 06/17/2019        Final   ??? Cholesterol, total 06/17/2019 179  <200 MG/DL Final   ??? Triglyceride 06/17/2019 145  <150 MG/DL Final   ??? HDL Cholesterol 06/17/2019 63* 40 - 60 MG/DL Final   ??? LDL,  calculated 06/17/2019 87  0 - 100 MG/DL Final   ??? VLDL, calculated 06/17/2019 29  MG/DL Final   ??? CHOL/HDL Ratio 06/17/2019 2.8  0 - 5.0   Final   ??? Magnesium 06/17/2019 2.0  1.6 - 2.6 mg/dL Final   ??? Sodium 06/17/2019 145  136 - 145 mmol/L Final   ??? Potassium 06/17/2019 4.1  3.5 - 5.5 mmol/L Final   ??? Chloride 06/17/2019 109  100 - 111 mmol/L Final   ??? CO2 06/17/2019 31  21 - 32 mmol/L Final   ??? Anion gap 06/17/2019 5  3.0 - 18 mmol/L Final   ??? Glucose 06/17/2019 159* 74 - 99 mg/dL Final   ??? BUN 06/17/2019 11  7.0 - 18 MG/DL Final   ??? Creatinine 06/17/2019 1.04  0.6 - 1.3 MG/DL Final   ??? BUN/Creatinine ratio 06/17/2019 11* 12 - 20   Final   ??? GFR est AA 06/17/2019 >60  >60 ml/min/1.73m2 Final   ??? GFR est non-AA 06/17/2019 54* >60 ml/min/1.73m2 Final   ??? Calcium 06/17/2019 8.6  8.5 - 10.1 MG/DL Final   ??? Bilirubin, total 06/17/2019 0.9  0.2 - 1.0 MG/DL Final   ??? ALT (SGPT) 06/17/2019 25  13 - 56 U/L Final   ??? AST (SGOT) 06/17/2019 15  10 - 38 U/L Final   ??? Alk. phosphatase 06/17/2019 164* 45 - 117 U/L Final   ??? Protein, total 06/17/2019 6.5  6.4 - 8.2 g/dL Final   ??? Albumin 06/17/2019 3.6  3.4 - 5.0 g/dL Final   ??? Globulin 06/17/2019 2.9  2.0 - 4.0 g/dL Final   ??? A-G Ratio 06/17/2019 1.2  0.8 - 1.7   Final   ??? Vitamin D 25-Hydroxy 06/17/2019 38.4  30 - 100 ng/mL Final   ??? GGT 06/17/2019 81* 5 - 55 U/L Final       Calculated 10 year ASCVD risk score: 13.9 %    Health Maintenance:  Screening:    Mammogram: abnormal (05/12/2019) right mammo/ultrasound due 10/2019.   PAP smear: unknown   Colorectal: colonoscopy (05/2018) in [place].    Depression: none   DM (HbA1c/FPG): HbA1c 7.1 (06/2019)   Hepatitis C: negative (03/2019)   Falls: none   DEXA: unknown   Glaucoma: unknown   Smoking: none   Vitamin D: 42.8 (03/2019)   Medicare Wellness: due for Initial wellness viist        Impression:  Patient Active Problem List   Diagnosis Code   ??? 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HCC) C91.01   ??? S/P bone marrow transplant  (HCC) Z94.81   ??? Essential hypertension I10   ??? Type 2 diabetes mellitus, with long-term current use of insulin (HCC) E11.9, Z79.4   ???   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 Mild intermittent asthma with acute exacerbation J45.21   ??? GERD (gastroesophageal reflux disease) K21.9   ??? Eczema L30.9   ??? Insomnia G47.00   ??? 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 Hyperlipidemia E78.5   ??? Abnormal mammogram of right breast R92.8   ??? Class 1 obesity due to excess calories with serious comorbidity and body mass index (BMI) of 30.0 to 30.9 in adult E66.09, Z68.30   ??? Elevated alkaline phosphatase level R74.8   ??? 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 Patient returned from [place] with a sore throat, nasal congestion, cough, and right ear pain. She denied experiencing any fever or chills. She reports that as her URI symptoms improved, her cough persisted so that it was now difficult to speak without coughing. Denies dyspnea except with coughing, and reports post-nasal drainage which is exacerbating the problem. Has been using her albuterol inhaler, but has not restarted Flovent. Noticeably coughing during visit while attempting to answer questions. Will treat with prednisone 40 mg daily x 5 days, advised to monitor blood sugars closely and supplement with Humalog as needed. Will also prescribe Tessalon Perles. Advised to restart Flovent inhaler after completing prednisone taper. Will also refill Flovent and albuterol inhalers today. Advised to present to ED for acute worsening, and advised to call if symptoms persist after completion of prednisone. Will follow.       Vestibular neuritis. Patient reported 6 day history of intermittent dizziness/ vertigo which worsened with position change. Associated with nausea. History of severe motion sickness on cruises and when sitting in the back seat of a car. Treated with meclizine 25  mg tid prn and provided with handouts for Epley maneuver. Reports improvement with complete resolution today. Will continue to monitor.    Other issues:  1. Acute lymphoblastic leukemia, +philadelphia chrom mutation, s/p stem cell transplant. Diagnosed in 02/2017 and underwent induction chemotherapy followed by stem cell transplant in 08/2017. Now on maintenance therapy with a TKI (nilotinib). Being followed closely by Dr. Istvan Redei, transplant oncologist, at [facility] in [place] with monthly blood draws and travel to [place] every 3 months for evaluation. Has also been followed locally by Dr. Tan. Completed travel to [place] last week with good report.   2. Hypertension. Blood pressure well controlled on amlodipine 5 mg daily. Renal function remains normal with creatinine 1.04/ eGFR >60. Will continue to monitor. On potassium and magnesium supplements due to long term difficulty with deficiency. Stable levels when last checked in 06/2019. Will consider changing treatment to Ace-I/ARB given renoprotective effect in diabetes mellitus. Will address at next visit.   3. Hyperlipidemia. Calculated 10 year ASCVD risk is 13.9 %, which placed her in one of the four statin benefit groups as per new AHA/ACC guidelines (primary prevention: 10 year ASCVD risk >7.5%). Patient also a diabetic. Started treatment with atorvastatin 20 mg daily in 03/2019, and repeat lipid panel with LDL 87 and HDL 63 in 06/2019. Emphasized importance of lifestyle modifications, including diet, exercise, and weight loss. Will recheck lipid panel at next visit. Continue to follow.  4. Diabetes mellitus. Previously well controlled on regimen of Tresiba 30 units daily and SS Humalog with HbA1c 6.5 in 03/2019. However, increased to HbA1c 7.1 in 06/2019, likely related to weight gain. Reported blood sugars had been in the 200's at her prior visit on 09/01/2019, and   advised to titrate Tresiba by 2-3 units every 3 days to achieve  fasting blood sugar of <150. Reports currently using Tresiba 33 U with some improvement in blood sugars. Advised to continue with titration. Will reassess next visit. Will consider addition of GLP-1 agonists at next visit. No evidence of microvascular complications. Started on statin but not on Ace-I. Referred to Dr. Iacobucci for eye exam, but no-showed for appointment. Foot exam grossly normal (08/2019), and no evidence of microalbuminuria with urine microalbumin/ creatinine ratio 18 mg/g. Emphasized importance of lifestyle modifications, including diet, exercise, and weight loss.   5. Peripheral neuropathy, painful. Thought to be secondary to chemotherapy. On Cymbalta 30 mg bid and Roxicodone prn through pain management. Did not tolerate gabapentin. Follow.   6. Asthma, mild intermittent. Previously treated with Flovent for maintenance therapy, but had been using only as needed. Advised to restart as discussed above given current exacerbation. Continue albuterol as needed. Will use when symptoms increase.   7. GERD. On Protonix 40 mg bid with reasonably good control. Follow.  8. Abnormal mammogram. Patient reports had previous biopsy of right breast which was benign. Screening mammogram (02/2019) showed multiple abnormalities in right breast. Right breast diagnostic mammogram and ultrasound (05/12/2019) showed probably benign findings, but recommendation to proceed with short interval 6 month follow-up (due 10/2019). Will place orders.  Patient with FH history of breast cancer in her mother.  9. Left trigger thumb. Evaluated by Dr. Wilson in 04/2019 and received a cortisone injection with improvement.   10. Elevated alkaline phosphatase. Unclear if related to transplant and bone marrow process. Will request records to see if has been elevated previously. However, GGT elevated at 81 in 06/17/2019 so likely of hepatic origin. Also, will check AMA and alkaline phosphatase isozymes at next visit, and discuss proceeding  with right upper quadrant ultrasound.    11. Insomnia. Patient using Ambien +/- Benadryl as needed. Will emphasize good sleep hygiene.   12. Eczema. On Xyzal and will use Kenalog as needed.   13. Mild submandibular adenopathy. Patient reports that having dental issues in lower molars needing root canal +/- extraction. Will monitor.   14. Obesity. Weight increased nearly 10 pounds since 03/2019. BMI now >30. Will emphasize importance of lifestyle modifications, including diet, exercise, and weight loss.  15. Health maintenance. Completed Moderna COVID 19 vaccine series. Completed Pneumovax 23 and Tdap. Given script for Shingrix vaccine but not yet obtained. Mammogram as discussed. Will attempt to request colonoscopy report. Referred to Dr. Rachel Lee for well woman exam and pap smear. Will address if kept appointment. Referred to Dr. Iacobucci for eye exams, but patient no-showed for appointment. Will readdress at next visit.  Vitamin D level normal. Continue maintenance dose supplement. Emphasized importance of lifestyle modifications, including diet, exercise, and weight loss. Due for an Initial Medicare Wellness visit and will perform at next visit.    Patient understands recommendations and agrees with plan.  Follow-up as previously scheduled for physical.       We discussed the expected course, resolution and complications of the diagnosis(es) in detail.  Medication risks, benefits, costs, interactions, and alternatives were discussed as indicated.  I advised her to contact the office if her condition worsens, changes or fails to improve as anticipated. She expressed understanding with the diagnosis(es) and plan.     [name] Wu, was evaluated through a synchronous (real-time) audio-video encounter. The patient (or guardian if applicable) is aware that this is a billable service. Verbal consent to proceed   has been obtained within the past 12 months. The visit was conducted pursuant to the emergency  declaration under the Stafford Act and the National Emergencies Act, 1135 waiver authority and the Coronavirus Preparedness and Response Supplemental Appropriations Act.  Patient identification was verified, and a caregiver was present when appropriate. The patient was located in a state where the provider was credentialed to provide care.    [name] Fedie M Jaleen Grupp, MD

## 2019-09-16 NOTE — Patient Instructions (Signed)
Asthma Attack: Care Instructions  Overview     During an asthma attack, the airways swell and narrow. This makes it hard to breathe. Severe asthma attacks can be dangerous. But you can help prevent these attacks by keeping your asthma under control and treating symptoms before they get bad. Symptoms include being short of breath, having chest tightness, coughing, and wheezing. Noting and treating these symptoms can also help you avoid trips to the emergency room.  If you notice any problems or new symptoms, get medical treatment right away.  Follow-up care is a key part of your treatment and safety. Be sure to make and go to all appointments, and call your doctor if you are having problems. It's also a good idea to know your test results and keep a list of the medicines you take.  How can you care for yourself at home?  ?? Follow your asthma action plan to prevent and treat attacks. If you don't have an asthma action plan, work with your doctor to create one.  ?? Take your asthma medicines exactly as prescribed. Talk to your doctor right away if you have any questions about how to take them.  ? Use your quick-relief medicine when you have symptoms of an attack. Quick-relief medicine is usually an albuterol inhaler. Some people need to use quick-relief medicine before they exercise.  ? Take your controller medicine every day, not just when you have symptoms. Controller medicine is usually an inhaled corticosteroid. The goal is to prevent problems before they occur.  ? If your doctor prescribed corticosteroid pills to use during an attack, take them exactly as prescribed. It may take hours for the pills to work, but they may make the episode shorter and help you breathe better.  ? Keep your quick-relief medicine with you at all times.  ?? Talk to your doctor before using other medicines. Some medicines, such as aspirin, can cause asthma attacks in some people.  ?? If you have a peak flow meter, use it to check how  well you are breathing. This can help you predict when an asthma attack is going to occur. Then you can take medicine to prevent the asthma attack or make it less severe.  ?? Do not smoke or allow others to smoke around you. Avoid smoky places. Smoking makes asthma worse. If you need help quitting, talk to your doctor about stop-smoking programs and medicines. These can increase your chances of quitting for good.  ?? Learn what triggers an asthma attack for you, and avoid the triggers when you can. Common triggers include colds, smoke, air pollution, dust, pollen, mold, pets, cockroaches, stress, and cold air.  ?? Avoid colds and the flu. Talk to your doctor about getting a pneumococcal vaccine shot. If you have had one before, ask your doctor if you need a second dose. Get a flu vaccine every fall. If you must be around people with colds or the flu, wash your hands often.  When should you call for help?   Call 911 anytime you think you may need emergency care. For example, call if:  ?? ?? You have severe trouble breathing.   Call your doctor now or seek immediate medical care if:  ?? ?? Your symptoms do not get better after you have followed your asthma action plan.   ?? ?? You have new or worse trouble breathing.   ?? ?? Your coughing and wheezing get worse.   ?? ?? You cough up dark brown or   bloody mucus (sputum).   ?? ?? You have a new or higher fever.   Watch closely for changes in your health, and be sure to contact your doctor if:  ?? ?? You need to use quick-relief medicine on more than 2 days a week within a month (unless it is just for exercise).   ?? ?? You cough more deeply or more often, especially if you notice more mucus or a change in the color of your mucus.   ?? ?? You are not getting better as expected.   Where can you learn more?  Go to https://www.healthwise.net/[facility]  Enter F084 in the search box to learn more about "Asthma Attack: Care Instructions."  Current as of: March 08, 2019??????????????????????????????Content Version: 12.8  ?? 2006-2021 [employer].   Care instructions adapted under license by [facility] (which disclaims liability or warranty for this information). If you have questions about a medical condition or this instruction, always ask your healthcare professional. Healthwise, Inc[employer] disclaims any warranty or liability for your use of this information.

## 2019-10-06 ENCOUNTER — Ambulatory Visit: Admit: 2019-10-06 | Discharge: 2019-10-06 | Payer: MEDICARE | Primary: Internal Medicine

## 2019-10-06 ENCOUNTER — Encounter

## 2019-10-06 MED ORDER — INSULIN LISPRO (U-100) 100 UNIT/ML SUBCUTANEOUS HALF-UNIT PEN
100 unit/mL | SUBCUTANEOUS | 3 refills | Status: DC
Start: 2019-10-06 — End: 2020-10-09

## 2019-10-06 MED ORDER — TRESIBA FLEXTOUCH U-200 INSULIN 200 UNIT/ML (3 ML) SUBCUTANEOUS PEN
200 unit/mL (3 mL) | Freq: Every day | SUBCUTANEOUS | 3 refills | Status: DC
Start: 2019-10-06 — End: 2020-09-27

## 2019-10-06 MED ORDER — FLUTICASONE 220 MCG/ACTUATION AEROSOL INHALER
220 mcg/actuation | Freq: Two times a day (BID) | RESPIRATORY_TRACT | 3 refills | Status: DC
Start: 2019-10-06 — End: 2019-12-08

## 2019-10-06 NOTE — Telephone Encounter (Signed)
 Last Visit: 09/16/19 with MD Chet  Next Appointment: 10/13/19 with MD Chet    Requested Prescriptions     Pending Prescriptions Disp Refills   . fluticasone propionate (Flovent HFA) 220 mcg/actuation inhaler 3 Inhaler 3     Sig: Take 2 Puffs by inhalation two (2) times a day. Indications: controller medication for asthma   . insulin  degludec (Tresiba  FlexTouch U-200) 200 unit/mL (3 mL) inpn pen 15 Adjustable Dose Pre-filled Pen Syringe 3     Sig: 33 Units by SubCUTAneous route daily.   . insulin  lispro (HumaLOG  Junior KwikPen U-100) 100 unit/mL inph 15 mL 3     Sig: 180-200 2 U; 201-220 4 U; 221-240 5 U; 241-280 6 U; 281-320 7 U; 321-400 8 U; >400 8 U and call  Indications: sliding scale

## 2019-10-06 NOTE — Telephone Encounter (Signed)
Last Visit: 09/16/19 with MD Unknown Jim  Next Appointment: 10/13/19 with MD Unknown Jim    Requested Prescriptions     Pending Prescriptions Disp Refills   ??? fluticasone propionate (Flovent HFA) 220 mcg/actuation inhaler 3 Inhaler 3     Sig: Take 2 Puffs by inhalation two (2) times a day. Indications: controller medication for asthma   ??? insulin degludec Tyler Aas FlexTouch U-200) 200 unit/mL (3 mL) inpn pen 15 Adjustable Dose Pre-filled Pen Syringe 3     Sig: 33 Units by SubCUTAneous route daily.   ??? insulin lispro (HumaLOG Junior KwikPen U-100) 100 unit/mL inph 15 mL 3     Sig: 180-200 2 U; 201-220 4 U; 221-240 5 U; 241-280 6 U; 281-320 7 U; 321-400 8 U; >400 8 U and call  Indications: sliding scale

## 2019-10-07 ENCOUNTER — Ambulatory Visit: Payer: MEDICARE | Primary: Internal Medicine

## 2019-10-07 MED ORDER — POTASSIUM CHLORIDE ER 20 MEQ TABLET,EXTENDED RELEASE
20 mEq | ORAL_TABLET | Freq: Every day | ORAL | 1 refills | Status: DC
Start: 2019-10-07 — End: 2020-08-08

## 2019-10-07 NOTE — Telephone Encounter (Signed)
Please find out if she is taking omeprazole or pantoprazole.

## 2019-10-07 NOTE — Telephone Encounter (Signed)
 These meds are listed as historical and were last prescribed by Dr. Abutalib. Please sign if appropriate.    Last Visit: 09/16/19 with MD Chet  Next Appointment: 10/13/19 with MD Chet    Requested Prescriptions     Pending Prescriptions Disp Refills   . potassium chloride  SR (K-Tab ) 20 mEq tablet 180 Tablet 1     Sig: Take 2 Tablets by mouth daily.   . omeprazole  (PRILOSEC ) 20 mg capsule 90 Capsule 1     Sig: Take 1 Capsule by mouth daily.

## 2019-10-07 NOTE — Telephone Encounter (Signed)
These meds are listed as historical and were last prescribed by Dr. De Burrs. Please sign if appropriate.    Last Visit: 09/16/19 with MD Unknown Jim  Next Appointment: 10/13/19 with MD Unknown Jim    Requested Prescriptions     Pending Prescriptions Disp Refills   ??? potassium chloride SR (K-Tab) 20 mEq tablet 180 Tablet 1     Sig: Take 2 Tablets by mouth daily.   ??? omeprazole (PRILOSEC) 20 mg capsule 90 Capsule 1     Sig: Take 1 Capsule by mouth daily.

## 2019-10-08 NOTE — Telephone Encounter (Signed)
Left message for patient to call the office.   RE : is patient  Taking omeprazole or pantoprazole.

## 2019-10-08 NOTE — Telephone Encounter (Signed)
Left message for patient to call the office.

## 2019-10-13 ENCOUNTER — Ambulatory Visit: Payer: MEDICARE | Attending: Internal Medicine | Primary: Internal Medicine

## 2019-10-13 NOTE — Telephone Encounter (Signed)
Attempted to call patient, no answer and voicemail is full. Will try again later.

## 2019-10-13 NOTE — Telephone Encounter (Signed)
Patient has appointment scheduled today will follow up with her at the appointment.

## 2019-10-18 MED ORDER — AMLODIPINE 5 MG TAB
5 mg | ORAL_TABLET | Freq: Every day | ORAL | 2 refills | Status: DC
Start: 2019-10-18 — End: 2019-10-26

## 2019-10-18 NOTE — Telephone Encounter (Signed)
 This med is listed as historical. Please sign if appropriate.    Last Visit: 09/16/19 with MD Chet  Next Appointment: 11/03/19 with MD Chet    Requested Prescriptions     Pending Prescriptions Disp Refills   . amLODIPine (NORVASC) 5 mg tablet 90 Tablet 1     Sig: Take 1 Tablet by mouth daily.

## 2019-10-26 MED ORDER — AMLODIPINE 5 MG TAB
5 mg | ORAL_TABLET | Freq: Every day | ORAL | 2 refills | Status: DC
Start: 2019-10-26 — End: 2020-05-11

## 2019-10-26 NOTE — Telephone Encounter (Signed)
 Requested Prescriptions     Pending Prescriptions Disp Refills   . amLODIPine (NORVASC) 5 mg tablet 90 Tablet 2     Sig: Take 1 Tablet by mouth daily.        Pt never received this RX that was auth 10/18/2019. It looks like the e-prescribe failed.     She has decided she wants 90 day supply sent to [facility]. Also please confirm the product is Amlodipine besylate 5 mg

## 2019-10-26 NOTE — Telephone Encounter (Signed)
Pt calling because she never received this medication from [place]     It looks like the eprescribe transmission did not go through 10/18/2019    She now wants it sent to local pharmacy and a 90 days supply    She also states the medicine is called amlodpine beyslate 5 mg tablet     Please send to [facility]

## 2019-10-27 ENCOUNTER — Ambulatory Visit: Payer: MEDICARE | Primary: Internal Medicine

## 2019-10-29 ENCOUNTER — Encounter: Payer: MEDICARE | Primary: Internal Medicine

## 2019-11-01 ENCOUNTER — Encounter: Payer: MEDICARE | Primary: Internal Medicine

## 2019-11-03 ENCOUNTER — Encounter: Payer: MEDICARE | Attending: Internal Medicine | Primary: Internal Medicine

## 2019-11-10 ENCOUNTER — Encounter

## 2019-11-10 ENCOUNTER — Inpatient Hospital Stay: Payer: MEDICARE | Attending: Internal Medicine | Primary: Internal Medicine

## 2019-11-26 ENCOUNTER — Telehealth: Attending: Internal Medicine | Primary: Internal Medicine

## 2019-11-26 ENCOUNTER — Telehealth: Admit: 2019-11-26 | Discharge: 2019-11-26 | Payer: MEDICARE | Attending: Internal Medicine | Primary: Internal Medicine

## 2019-11-26 DIAGNOSIS — J40 Bronchitis, not specified as acute or chronic: Secondary | ICD-10-CM

## 2019-11-26 MED ORDER — BENZONATATE 200 MG CAP
200 mg | ORAL_CAPSULE | Freq: Three times a day (TID) | ORAL | 0 refills | Status: AC | PRN
Start: 2019-11-26 — End: 2019-12-03

## 2019-11-26 MED ORDER — AZITHROMYCIN 250 MG TAB
250 mg | ORAL_TABLET | ORAL | 0 refills | Status: AC
Start: 2019-11-26 — End: 2019-12-01

## 2019-11-26 NOTE — Progress Notes (Signed)
[name] Wu is a 59 y.o. female, evaluated via audio-only technology on 11/26/2019 for Cough  .    Assessment & Plan:   Diagnoses and all orders for this visit:    1. Bronchitis  -     azithromycin (ZITHROMAX) 250 mg tablet; Take 2 tablets today, then take 1 tablet daily    2. Cough  -     benzonatate (TESSALON) 200 mg capsule; Take 1 Capsule by mouth three (3) times daily as needed for Cough for up to 7 days.        12  Subjective:   Acute Bronchitis  Patient presents for presents evaluation of nasal congestion and productive cough. Symptoms began 2 weeks ago and are unchanged since that time.  Past history is significant for altered medical problems including history of stem cell transplant, diabetes, hypertension and high cholesterol..        Prior to Admission medications    Medication Sig Start Date End Date Taking? Authorizing Provider   azithromycin (ZITHROMAX) 250 mg tablet Take 2 tablets today, then take 1 tablet daily 11/26/19 12/01/19 Yes Albion Weatherholtz, Ninfa Meeker, MD   benzonatate (TESSALON) 200 mg capsule Take 1 Capsule by mouth three (3) times daily as needed for Cough for up to 7 days. 11/26/19 12/03/19 Yes Davarion Cuffee, Ninfa Meeker, MD   amLODIPine (NORVASC) 5 mg tablet Take 1 Tablet by mouth daily. 10/26/19   Blanch Media, MD   potassium chloride SR (K-Tab) 20 mEq tablet Take 2 Tablets by mouth daily. 10/07/19   Blanch Media, MD   fluticasone propionate (Flovent HFA) 220 mcg/actuation inhaler Take 2 Puffs by inhalation two (2) times a day. Indications: controller medication for asthma 10/06/19   Blanch Media, MD   insulin degludec Tyler Aas FlexTouch U-200) 200 unit/mL (3 mL) inpn pen 33 Units by SubCUTAneous route daily. 10/06/19   Blanch Media, MD   insulin lispro (HumaLOG Junior KwikPen U-100) 100 unit/mL inph For fasting blood glucose: 180-200 2 U; 201-220 4 U; 221-240 5 U; 241-280 6 U; 281-320 7 U; 321-400 8 U; >400 8 U and call  Indications: sliding scale 10/06/19   Blanch Media, MD   albuterol  (PROVENTIL HFA, VENTOLIN HFA, PROAIR HFA) 90 mcg/actuation inhaler Take 1 Puff by inhalation every four (4) hours as needed for Wheezing or Cough. 09/16/19   Blanch Media, MD   diphenhydrAMINE (BenadryL) 25 mg capsule Take 25 mg by mouth every six (6) hours as needed.    Provider, Historical   ZINC PO Take  by mouth.    Provider, Historical   thiamine HCl (VITAMIN B-1 PO) Take  by mouth.    Provider, Historical   ascorbic acid (VITAMIN C PO) Take  by mouth.    Provider, Historical   atorvastatin (LIPITOR) 20 mg tablet Take 1 Tab by mouth daily. 07/19/19   Leo Rod, MD   BIOTIN PO Take 1 Tab by mouth daily.    Provider, Historical   calcium carb/vit D3/minerals (CALTRATE 600+D PLUS MINERALS PO) Take  by mouth daily.    Provider, Historical   OneTouch Ultra Blue Test Strip strip TEST 4 TIMES A DAY 02/24/19   Provider, Historical   Nano Pen Needle 32 gauge x 5/32" ndle INJECT 4 TIMES A DAY WITH MEALS AND AT BEDTIME 02/25/19   Provider, Historical   triamcinolone acetonide (KENALOG) 0.1 % topical cream APPLY TO AFFECTED AREA TWICE A DAY AS NEEDED FOR RASH 03/02/19   Provider,  Historical   levocetirizine dihydrochloride (XYZAL PO) Take  by mouth.    Provider, Historical   RESVERATROL-QUERCETIN PO Take  by mouth.    Provider, Historical   DULoxetine (CYMBALTA) 30 mg capsule TAKE 1 CAPSULE BY MOUTH TWICE A DAY 12/14/18   Provider, Historical   albuterol (PROVENTIL VENTOLIN) 2.5 mg /3 mL (0.083 %) nebu 3 mL by Nebulization route every four (4) hours as needed (wheezing). 01/02/18   Kristen Loader, MD   magnesium oxide (MAG-OX) 400 mg tablet Take 800 mg by mouth two (2) times a day.    Provider, Historical   pantoprazole (Protonix) 40 mg tablet Take 40 mg by mouth two (2) times a day.    Provider, Historical   prochlorperazine (COMPAZINE) 10 mg tablet Take 10 mg by mouth every four (4) hours as needed.    Other, Phys, MD   nilotinib (TASIGNA) 200 mg cap capsule Take 200 mg by mouth two (2) times a day.    Other, Phys,  MD   Magnesium Oxide-Mg AA Chelate (MG-PLUS) 133 mg tab Take 1 Tab by mouth two (2) times a day.    Other, Phys, MD   cholecalciferol, VITAMIN D3, (VITAMIN D3) 5,000 unit tab tablet Take 5,000 Units by mouth daily.    Other, Phys, MD   oxyCODONE IR (ROXICODONE) 5 mg immediate release tablet Take 2 Tabs by mouth every four (4) hours as needed. Max Daily Amount: 60 mg. 04/26/17   Tonny Bollman, MD   acyclovir (ZOVIRAX) 400 mg tablet Take 800 mg by mouth two (2) times a day.    Other, Phys, MD   zolpidem (AMBIEN) 5 mg tablet Take 5 mg by mouth nightly.    Other, Phys, MD     Patient Active Problem List   Diagnosis Code   ??? 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 Essential hypertension I10   ??? Type 2 diabetes mellitus, with long-term current use of insulin ([facility]) E11.9, Z79.4   ??? 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 Mild intermittent asthma with acute exacerbation J45.21   ??? GERD (gastroesophageal reflux disease) K21.9   ??? Eczema L30.9   ??? Insomnia G47.00   ??? 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 Hyperlipidemia E78.5   ??? Abnormal mammogram of right breast R92.8   ??? Class 1 obesity due to excess calories with serious comorbidity and body mass index (BMI) of 30.0 to 30.9 in adult E66.09, Z68.30   ??? Elevated alkaline phosphatase level R74.8   ??? 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     [name] Wu, who was evaluated through a patient-initiated, synchronous (real-time) audio only encounter, and/or her healthcare decision maker, is aware that it is a billable service, with coverage as determined by her insurance carrier. She provided verbal consent to proceed: Yes. She has not had a related appointment within my department in the past 7 days or scheduled within the next 24 hours.      Total Time: minutes: 5-10 minutes    Thana Farr, MD

## 2019-12-08 ENCOUNTER — Encounter

## 2019-12-08 MED ORDER — FLOVENT HFA 220 MCG/ACTUATION AEROSOL INHALER
220 mcg/actuation | RESPIRATORY_TRACT | 5 refills | Status: DC
Start: 2019-12-08 — End: 2021-04-13

## 2019-12-28 ENCOUNTER — Ambulatory Visit: Attending: Internal Medicine | Primary: Internal Medicine

## 2019-12-28 ENCOUNTER — Ambulatory Visit: Admit: 2019-12-28 | Discharge: 2019-12-28 | Payer: MEDICARE | Attending: Internal Medicine | Primary: Internal Medicine

## 2019-12-28 ENCOUNTER — Inpatient Hospital Stay: Admit: 2019-12-28 | Payer: MEDICARE | Primary: Internal Medicine

## 2019-12-28 DIAGNOSIS — Z0000 Encounter for general adult medical examination without abnormal findings: Secondary | ICD-10-CM

## 2019-12-28 DIAGNOSIS — G620 Drug-induced polyneuropathy: Secondary | ICD-10-CM

## 2019-12-28 MED ORDER — OXYCODONE 10 MG TAB
10 mg | ORAL_TABLET | ORAL | 0 refills | Status: AC | PRN
Start: 2019-12-28 — End: 2020-01-27

## 2019-12-28 MED ORDER — DULOXETINE 30 MG CAP, DELAYED RELEASE
30 mg | ORAL_CAPSULE | Freq: Two times a day (BID) | ORAL | 3 refills | Status: AC
Start: 2019-12-28 — End: 2021-05-17

## 2019-12-28 NOTE — Telephone Encounter (Signed)
[facility] is [facility]

## 2019-12-28 NOTE — Progress Notes (Signed)
HPI:   [name] Wu is a 59 y.o. year old female who presents today for an acute visit. She has a history of acute lymphoblastic leukemia, s/p stem cell transplant, hypertension, hyperlipidemia, diabetes mellitus, peripheral neuropathy, asthma, GERD, insomnia, and osteoarthritis. She is continuing to follow with the [facility] in [place] and travels there every 3 months for exam.  She has completed the [facility] COVID-19 vaccine series.  She reports that she traveled to [place] for a vacation with her family and upon returning developed an upper respiratory infection and asthma exacerbation. She was evaluated by Dr. Madelyn Brunner on 11/26/2019 and prescribed azithromycin and Tessalon.  She reports that after completing the azithromycin, her symptoms persisted and she presented to urgent care and underwent Covid testing and a chest x-ray, which were negative.  She was prescribed doxycycline for 14 days and reports complete resolution.  She presents today requesting a refill of Roxicodone which is usually prescribed by pain management.  She states that in order for them to refill, she is required to have an in person visit with them every 6 months, but she is not returning to [place] until 02/2020 for her follow-up with her oncologist.  She states that she attempted to do without the narcotic pain medicine, but after approximately 10 days, she reports significant increase in her neuropathic pain at night and is finding it very uncomfortable.  She also continues on duloxetine 30 mg twice daily and meloxicam 15 mg daily.  She also reports that she has started a Nutrisystem diet plan and has successfully lost 11 pounds over the last month.  She also reports that she continues to take [place] 33 units daily, and her blood sugars range from 120-150. She is otherwise without new complaints and feeling generally well.       Summary of prior hospitalizations and medical history:   She has a history of acute  lymphoblastic leukemia, diagnosed in 02/2017 after experiencing severe fatigue and bone pain for several months. She was positive for the [place] chromosome mutation. She presented to the [facility] in [place] for care, and underwent chemotherapy which achieved remission. She subsequently underwent a stem cell transplant on 08/14/2017, felt to be curative. She is now on maintenance therapy with a tyrosine kinase inhibitor, nilotimib 200 mg bid. She is followed by Dr. Noemi Chapel , transplant oncologist, and travels to [facility] every three months for care as discussed. She is also followed locally by Dr. Edd Arbour.     He has a history of hypertension, treated with amlodipine. She has a history of hyperlipidemia, and was restarted on atorvastatin in 07/2019.  She also has a history of diabetes mellitus, currently being treated with [place] and Humalog. She denies any polyuria, polydipsia, nocturia, or blurry vision, and has no history of retinopathy or nephropathy. 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 She is currently under the care of pain management and is being treated with Cymbalta and Roxicodone as needed.     She has a history of asthma since childhood, and is being maintained on Flovent and albuterol as needed. She denies any cough or shortness of breath.     In 04/2018, she was admitted to [facility] from 12/4-12/10/2017 for a UTI and then subsequently readmitted from 12/6-12/17/2019 with MRSE bacteremia related to a PICC line and bibasilar pneumonia.     He has a history of GERD, treated  with Protonix. She also underwent a screening colonoscopy in 05/2018 in [place]. Report unavailable. She denies any abdominal pain, nausea, vomiting, melena, hematochezia, or change in bowel movements.      Past Medical History:   Diagnosis Date   ??? ALL (acute lymphoblastic leukemia) ([place]) 02/2017    s/p stem cell transplant  08/2017; [facility] in [place]   ??? Diabetes ([place])    ??? GERD (gastroesophageal reflux disease) 03/18/2019   ??? 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 H/O stem cell transplant ([place]) 041/04/19   ??? Hyperlipidemia 03/22/2019   ??? Hypertension    ??? Insomnia 03/22/2019   ??? Mild intermittent asthma without complication 77/08/1285   ??? Peripheral neuropathy due to chemotherapy ([place]) 03/18/2019     No past surgical history on file.  Current Outpatient Medications   Medication Sig   ??? oxyCODONE IR (ROXICODONE) 10 mg tab immediate release tablet Take 1 Tablet by mouth every four (4) hours as needed for Pain for up to 30 days. Max Daily Amount: 60 mg.   ??? Flovent HFA 220 mcg/actuation inhaler TAKE 2 PUFFS BY INHALATION TWO (2) TIMES A DAY. INDICATIONS: CONTROLLER MEDICATION FOR ASTHMA   ??? amLODIPine (NORVASC) 5 mg tablet Take 1 Tablet by mouth daily.   ??? potassium chloride SR (K-Tab) 20 mEq tablet Take 2 Tablets by mouth daily.   ??? insulin degludec Tyler Aas FlexTouch U-200) 200 unit/mL (3 mL) inpn pen 33 Units by SubCUTAneous route daily.   ??? insulin lispro (HumaLOG Junior KwikPen U-100) 100 unit/mL inph For fasting blood glucose: 180-200 2 U; 201-220 4 U; 221-240 5 U; 241-280 6 U; 281-320 7 U; 321-400 8 U; >400 8 U and call  Indications: sliding scale   ??? albuterol (PROVENTIL HFA, VENTOLIN HFA, PROAIR HFA) 90 mcg/actuation inhaler Take 1 Puff by inhalation every four (4) hours as needed for Wheezing or Cough.   ??? diphenhydrAMINE (BenadryL) 25 mg capsule Take 25 mg by mouth every six (6) hours as needed.   ??? ZINC PO Take  by mouth.   ??? thiamine HCl (VITAMIN B-1 PO) Take  by mouth.   ??? ascorbic acid (VITAMIN C PO) Take  by mouth.   ??? atorvastatin (LIPITOR) 20 mg tablet Take 1 Tab by mouth daily.   ??? BIOTIN PO Take 1 Tab by mouth daily.   ??? calcium carb/vit D3/minerals (CALTRATE 600+D PLUS MINERALS PO) Take  by mouth daily.   ??? OneTouch Ultra Blue Test Strip strip TEST 4 TIMES A DAY   ??? Nano Pen Needle 32  gauge x 5/32" ndle INJECT 4 TIMES A DAY WITH MEALS AND AT BEDTIME   ??? triamcinolone acetonide (KENALOG) 0.1 % topical cream APPLY TO AFFECTED AREA TWICE A DAY AS NEEDED FOR RASH   ??? levocetirizine dihydrochloride (XYZAL PO) Take  by mouth.   ??? RESVERATROL-QUERCETIN PO Take  by mouth.   ??? albuterol (PROVENTIL VENTOLIN) 2.5 mg /3 mL (0.083 %) nebu 3 mL by Nebulization route every four (4) hours as needed (wheezing).   ??? magnesium oxide (MAG-OX) 400 mg tablet Take 800 mg by mouth two (2) times a day.   ??? pantoprazole (Protonix) 40 mg tablet Take 40 mg by mouth two (2) times a day.   ??? prochlorperazine (COMPAZINE) 10 mg tablet Take 10 mg by mouth every four (4) hours as needed.   ??? nilotinib (TASIGNA) 200 mg cap capsule Take 200 mg by mouth two (2) times a day.   ???  Magnesium Oxide-Mg AA Chelate (MG-PLUS) 133 mg tab Take 1 Tab by mouth two (2) times a day.   ??? cholecalciferol, VITAMIN D3, (VITAMIN D3) 5,000 unit tab tablet Take 5,000 Units by mouth daily.   ??? acyclovir (ZOVIRAX) 400 mg tablet Take 800 mg by mouth two (2) times a day.   ??? zolpidem (AMBIEN) 5 mg tablet Take 5 mg by mouth nightly.   ??? DULoxetine (CYMBALTA) 30 mg capsule Take 1 Capsule by mouth two (2) times a day.     No current facility-administered medications for this visit.     Allergies and Intolerances:   Allergies   Allergen Reactions   ??? Pcn [Penicillins] Hives and Itching     Family History: She has no family history of colon cancer of leukemia. Mother had breast cancer at the age of 65.   Family History   Problem Relation Age of Onset   ??? Breast Cancer Mother      Social History:   She  reports that she has never smoked. She has never used smokeless tobacco. She is divorced and has no children. She is a Optometrist, and working virtually due to the pandemic.     Social History     Substance and Sexual Activity   Alcohol Use No     Immunization History:  Immunization History   Administered Date(s) Administered   ??? Covid-19, MODERNA, Mrna, Lnp-s, Pf,  151mg/0.5mL 06/16/2019, 07/14/2019   ??? Influenza Vaccine 02/27/2018, 02/23/2019   ??? Pneumococcal Polysaccharide (PPSV-23) 03/18/2019   ??? Tdap 03/18/2019       Review of Systems:   As above included in HPI.  Otherwise 11 point review of systems negative including constitutional, skin, HENT, eyes, respiratory, cardiovascular, gastrointestinal, genitourinary, musculoskeletal, endocrine, hematologic, allergy, and neurologic.    Physical:   Visit Vitals  BP 112/72 (BP 1 Location: Left arm, BP Patient Position: Sitting)   Pulse 93   Temp 97 ??F (36.1 ??C) (Temporal)   Resp 16   Ht 5' 8"  (1.727 m)   Wt 192 lb (87.1 kg)   SpO2 96%   BMI 29.19 kg/m??       Patient appears in no apparent distress. Affect is appropriate.    HEENT: PERRLA, anicteric, no JVD, adenopathy or thyromegaly. No carotid bruits or radiated murmur.  Lungs: clear to auscultation, no wheezes, rhonchi, or rales.  Heart: regular rate and rhythm. No murmur, rubs, gallops  Abdomen: soft, nontender, nondistended, normal bowel sounds, no hepatosplenomegaly or masses.   Extremities: without edema.          Review of Data:  Labs:  Hospital Outpatient Visit on 12/28/2019   Component Date Value Ref Range Status   ??? Amphetamine Screen, urine 12/28/2019 Negative  Cutoff=1000 ng/mL Final   ??? Barbiturates Screen, urine 12/28/2019 Negative  Cutoff=200 ng/mL Final   ??? Benzodiazepines Screen, urine 12/28/2019 Negative  Cutoff=200 ng/mL Final   ??? Cannabinoid Screen, urine 12/28/2019 Negative  Cutoff=20 ng/mL Final   ??? Cocaine Metab. Screen, urine 12/28/2019 Negative  Cutoff=300 ng/mL Final   ??? Opiate Screen, urine 12/28/2019 PENDING  ng/mL Incomplete   ??? Oxycodone/Oxymorphone, urine 12/28/2019 See Final Results  Cutoff=100 ng/mL Final   ??? Phencyclidine Screen, urine 12/28/2019 Negative  Cutoff=25 ng/mL Final   ??? Methadone Screen, urine 12/28/2019 Negative  Cutoff=300 ng/mL Final   ??? Propoxyphene Screen, urine 12/28/2019 Negative  Cutoff=300 ng/mL Final   ??? Meperidine Screen,  urine 12/28/2019 Negative  Cutoff=200 ng/mL Final   ??? Fentanyl Screen,  urine 12/28/2019 Negative  Cutoff=2000 pg/mL Final   ??? Tramadol Screen, urine 12/28/2019 Negative  Cutoff=200 ng/mL Final   ??? Creatinine, urine 12/28/2019 104.1  20.0 - 300.0 mg/dL Final   ??? Specific Gravity 12/28/2019 1.008    Final   ??? pH, urine 12/28/2019 7.4  4.5 - 8.9   Final   ??? Please Note 12/28/2019 Comment    Final       Calculated 10 year ASCVD risk score: 13.9 %    Health Maintenance:  Screening:    Mammogram: abnormal (05/12/2019) right mammo/ultrasound due 10/2019.   PAP smear: Scheduled with NP Meyer   Colorectal: colonoscopy (05/2018) in [place].    Depression: none   DM (HbA1c/FPG): HbA1c 7.1 (06/2019)   Hepatitis C: negative (03/2019)   Falls: none   DEXA: unknown   Glaucoma: Scheduled with Dr. Faustino Congress   Smoking: none   Vitamin D: 42.8 (03/2019)   Medicare Wellness: today        Impression:  Patient Active Problem List   Diagnosis Code   ??? 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[place]) C91.01   ??? S/P bone marrow transplant ([place]) Z94.81   ??? Essential hypertension I10   ??? Type 2 diabetes mellitus, with long-term current use of insulin ([facility]) E11.9, Z79.4   ??? Peripheral neuropathy due to chemotherapy ([place]) G62.0, T45.1X5A   ??? Mild intermittent asthma with acute exacerbation J45.21   ??? GERD (gastroesophageal reflux disease) K21.9   ??? Eczema L30.9   ??? Insomnia G47.00   ??? 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 Hyperlipidemia E78.5   ??? Abnormal mammogram of right breast R92.8   ??? Class 1 obesity due to excess calories with serious comorbidity and body mass index (BMI) of 30.0 to 30.9 in adult E66.09, Z68.30   ??? Elevated alkaline phosphatase level R74.8   ??? 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 Acute lymphoblastic leukemia, +philadelphia chrom mutation, s/p stem cell transplant. Diagnosed in 02/2017 and underwent induction chemotherapy followed by stem cell transplant in 08/2017. Now on maintenance therapy with  a TKI (nilotinib). Being followed closely by Dr. Noemi Chapel, transplant oncologist, at [facility] in [place] with monthly blood draws and travel to [place] every 3 months for evaluation. Has also been followed locally by Dr. Edd Arbour.  Next visit to [facility] scheduled for 02/2020.    2. Hypertension. Blood pressure well controlled on amlodipine 5 mg daily. Renal function has been normal with creatinine 1.04/ eGFR >60. Will continue to monitor. On potassium and magnesium supplements due to long term difficulty with deficiency. Stable levels when last checked in 06/2019. Will consider changing treatment to Ace-I/ARB given renoprotective effect in diabetes mellitus. Will address at next visit.   3. Hyperlipidemia. Calculated 10 year ASCVD risk is 13.9 %, which placed her in one of the four statin benefit groups as per new AHA/ACC guidelines (primary prevention: 10 year ASCVD risk >7.5%). Patient also a diabetic. Started treatment with atorvastatin 20 mg daily in 03/2019, and repeat lipid panel with LDL 87 and HDL 63 in 06/2019. Emphasized importance of lifestyle modifications, including diet, exercise, and weight loss. Will recheck lipid panel at next visit. Continue to follow.  4. Diabetes mellitus. Previously well controlled on regimen of Tresiba 30 units daily and SS Humalog with HbA1c 6.5 in 03/2019. However, increased to HbA1c 7.1 in 06/2019, likely related to weight gain. Reported blood sugars had been in the 200's at her  prior visit on 09/01/2019, and advised to titrate [place] achieve fasting blood sugar of <150. Reports currently using Tresiba 33 U with fasting blood sugars 120-150 currently.  Instructed today to continue to titrate by 2 to 3 units daily until fasting blood sugar <120.  Will consider addition of GLP-1 agonist at next visit. No evidence of microvascular complications. Started on statin but not on Ace-I. Referred to Dr. Faustino Congress for eye exam, and scheduled in 01/2020. Foot exam  grossly normal (08/2019), and no evidence of microalbuminuria with urine microalbumin/ creatinine ratio 18 mg/g. Emphasized importance of lifestyle modifications, including diet, exercise, and weight loss.   5. Peripheral neuropathy, painful. Thought to be secondary to chemotherapy. Did not tolerate gabapentin. On Cymbalta 30 mg bid and Roxicodone through pain management.  Presents today requesting refill as pain management requires an in person visit every 6 months in order to refill.  However, reports that not scheduled to return to [facility] until October 2021.  PMP reviewed and last dispensed in 10/2019.  Will obtain urine drug screen today and prescribe refill for Roxicodone.  6. Asthma, mild intermittent.  On Flovent for maintenance therapy and albuterol as needed.  Developed recent URI after returning from a trip to [place] and evaluated by Dr. Madelyn Brunner on 11/26/2019 and was prescribed Tessalon and azithromycin.  However did not note improvement and presented to urgent care after completion.  Reports tested for COVID-19 and was negative, and chest x-ray was negative for pneumonia.  Prescribed doxycycline for 14 days and reports resolution.  7. GERD. On Protonix 40 mg bid with reasonably good control. Follow.  8. Abnormal mammogram. Patient reports had previous biopsy of right breast which was benign. Screening mammogram (02/2019) showed multiple abnormalities in right breast. Right breast diagnostic mammogram and ultrasound (05/12/2019) showed probably benign findings, but recommendation to proceed with short interval 6 month follow-up (due 10/2019).  Not yet performed and will readdress at next visit.  Patient with FH history of breast cancer in her mother.  9. Left trigger thumb. Evaluated by Dr. Redmond Pulling in 04/2019 and received a cortisone injection with improvement.   10. Elevated alkaline phosphatase. Unclear if related to transplant and bone marrow process. Will request records to see if has been elevated  previously. However, GGT elevated at 81 in 06/17/2019 so likely of hepatic origin. Also, will check AMA and alkaline phosphatase isozymes at next visit, and discuss proceeding with right upper quadrant ultrasound.    11. Insomnia. Patient using Ambien +/- Benadryl as needed. Will emphasize good sleep hygiene.  Continue to monitor.  12. Eczema. On Xyzal and will use Kenalog as needed.   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 Patient reported 6 day history of intermittent dizziness/ vertigo which worsened with position change. Associated with nausea. History of severe motion sickness on cruises and when sitting in the back seat of a car. Treated with meclizine 25 mg tid prn and provided with handouts for Epley maneuver. Reports improvement with complete resolution. Will continue to monitor.  14.  Obesity. Weight decreased 11 pounds since 08/2019 and patient reports that she is now enrolled in the diabetes Nutrisystem plan.  She is very pleased and is continuing to attempt weight loss. Emphasized importance of continuing with lifestyle modifications, including diet, exercise, and weight loss.  15. Health maintenance. Completed Moderna COVID 19 vaccine series.  Discussed need for third dose giving immunosuppression and patient states that she will proceed.  Completed Pneumovax 23 and Tdap. Given script for Shingrix vaccine but not yet obtained.  Mammogram as discussed. Will attempt to request colonoscopy report. Referred for well woman exam and reports scheduled on 01/04/2020 with NP Bjorn Loser at [place]. Referred to Dr. Faustino Congress for eye exams and scheduled for 01/26/2020.  Vitamin D level has been normal on maintenance dose supplement. Emphasized importance of lifestyle modifications, including diet, exercise, and weight loss.     In addition, an initial Medicare wellness visit was done today.     Patient understands recommendations and agrees with plan.  Follow-up as previously scheduled.

## 2019-12-28 NOTE — Progress Notes (Signed)
This is an Initial Medicare Annual Wellness Exam (AWV) (Performed 12 months after IPPE or effective date of Medicare Part B enrollment, Once in a lifetime)    I have reviewed the patient's medical history in detail and updated the computerized patient record.       Assessment/Plan   Education and counseling provided:  Are appropriate based on today's review and evaluation  End-of-Life planning (with patient's consent)  Influenza Vaccine  Screening Mammography  Screening Pap and pelvic (covered once every 2 years)  Colorectal cancer screening tests  Cardiovascular screening blood test  Bone mass measurement (DEXA)  Screening for glaucoma  Medical nutrition therapy for individuals with diabetes or renal disease  Shingrix vaccine    1. Peripheral neuropathy due to chemotherapy (HCC)  -     PAIN MGMT PANEL, URINE W/RFLX CONFIRM; Future  2. Painful diabetic neuropathy (HCC)  -     oxyCODONE IR (ROXICODONE) 10 mg tab immediate release tablet; Take 1 Tablet by mouth every four (4) hours as needed for Pain for up to 30 days. Max Daily Amount: 60 mg., Normal, Disp-60 Tablet, R-0  -     HEMOGLOBIN A1C WITH EAG; Future  3. 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 S/P bone marrow transplant ([place])  5. Type 2 diabetes mellitus with diabetic neuropathy, with long-term current use of insulin (HCC)  -     HEMOGLOBIN A1C WITH EAG; Future  -     LIPID PANEL; Future  -     METABOLIC PANEL, COMPREHENSIVE; Future  -     MICROALBUMIN, UR, RAND W/ MICROALB/CREAT RATIO; Future  6. Encounter for long-term use of opiate analgesic  -     PAIN MGMT PANEL, URINE W/RFLX CONFIRM; Future  7. Essential hypertension  -     MAGNESIUM; Future  -     METABOLIC PANEL, COMPREHENSIVE; Future  8. Mild intermittent asthma without complication  9. Overweight (BMI 25.0-29.9)  10. Initial Medicare annual wellness  visit  -     ADVANCE CARE PLANNING FIRST 30 MINS  11. Advanced directives, counseling/discussion  -     ADVANCE CARE PLANNING FIRST 41 MINS  12. Screening for depression  13. Elevated alkaline phosphatase level  -     VITAMIN D, 25 HYDROXY; Future  -     GGT; Future  -     ALK PHOS ISOENZYMES; Future  -     MITOCHONDRIAL M2 AB; Future  14. Vitamin D deficiency, unspecified   -     VITAMIN D, 25 HYDROXY; Future       Depression Risk Factor Screening     3 most recent PHQ Screens 12/28/2019   Little interest or pleasure in doing things Not at all   Feeling down, depressed, irritable, or hopeless Not at all   Total Score PHQ 2 0       Alcohol Risk Screen    Do you average more than 1 drink per night or more than 7 drinks a week:  No    On any one occasion in the past three months have you have had more than 3 drinks containing alcohol:  No         Functional Ability and Level of Safety    Hearing: Hearing is good. Patient  reports tinnitus.     Activities of Daily Living:  The home contains: no safety equipment.  Patient does total self care     Ambulation: with no difficulty      Fall Risk:  Fall Risk Assessment, last 12 mths 09/01/2019   Able to walk? Yes   Fall in past 12 months? 0   Do you feel unsteady? 0   Are you worried about falling 0      Abuse Screen:  Patient is not abused       Cognitive Screening    Has your family/caregiver stated any concerns about your memory: no     Cognitive Screening: None performed today    Health Maintenance Due     Health Maintenance Due   Topic Date Due   ??? Eye Exam Retinal or Dilated  Never done   ??? PAP AKA CERVICAL CYTOLOGY  Never done   ??? Colorectal Cancer Screening Combo  Never done   ??? Shingrix Vaccine Age 40> (1 of 2) Never done       Patient Care Team   Patient Care Team:  Blanch Media, MD as PCP - General (Internal Medicine)  Unknown Jim, Royston Cowper, MD as PCP - [facility] Empaneled Provider  Elenora Gamma, NP as Nurse Practitioner (Gynecology)  Adria Devon, MD  (Ophthalmology)  [name], MD (Hematology and Oncology)    History     Patient Active Problem List   Diagnosis Code   ??? 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[place]) C91.01   ??? S/P bone marrow transplant ([place]) Z94.81   ??? Essential hypertension I10   ??? Type 2 diabetes mellitus, with long-term current use of insulin ([place]) E11.9, Z79.4   ??? Peripheral neuropathy due to chemotherapy ([place]) G62.0, T45.1X5A   ??? Mild intermittent asthma without complication D98.33   ??? GERD (gastroesophageal reflux disease) K21.9   ??? Eczema L30.9   ??? Insomnia G47.00   ??? 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 Hyperlipidemia E78.5   ??? Abnormal mammogram of right breast R92.8   ??? Overweight (BMI 25.0-29.9) E66.3   ??? Elevated alkaline phosphatase level R74.8   ??? 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 ALL (acute lymphoblastic leukemia) ([place]) 02/2017    s/p stem cell transplant 08/2017; [place] in [place]   ??? Diabetes ([place])    ??? GERD (gastroesophageal reflux disease) 03/18/2019   ??? 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 H/O stem cell transplant ([place]) 041/04/19   ??? Hyperlipidemia 03/22/2019   ??? Hypertension    ??? Insomnia 03/22/2019   ??? Mild intermittent asthma without complication 82/09/537   ??? Peripheral neuropathy due to chemotherapy ([place]) 03/18/2019      No past surgical history on file.  Current Outpatient Medications   Medication Sig Dispense Refill   ??? oxyCODONE IR (ROXICODONE) 10 mg tab immediate release tablet Take 1 Tablet by mouth every four (4) hours as needed for Pain for up to 30 days. Max Daily Amount: 60 mg. 60 Tablet 0   ??? Flovent HFA 220 mcg/actuation inhaler TAKE 2 PUFFS BY INHALATION TWO (2) TIMES A DAY. INDICATIONS: CONTROLLER MEDICATION FOR ASTHMA 1 Inhaler 5   ??? amLODIPine (NORVASC) 5 mg tablet Take 1 Tablet by mouth daily. 90 Tablet 2   ??? potassium chloride SR (K-Tab) 20 mEq tablet Take 2 Tablets by mouth daily. 180 Tablet  1   ???  insulin degludec Tyler Aas FlexTouch U-200) 200 unit/mL (3 mL) inpn pen 33 Units by SubCUTAneous route daily. 15 Adjustable Dose Pre-filled Pen Syringe 3   ??? insulin lispro (HumaLOG Junior KwikPen U-100) 100 unit/mL inph For fasting blood glucose: 180-200 2 U; 201-220 4 U; 221-240 5 U; 241-280 6 U; 281-320 7 U; 321-400 8 U; >400 8 U and call  Indications: sliding scale 15 mL 3   ??? albuterol (PROVENTIL HFA, VENTOLIN HFA, PROAIR HFA) 90 mcg/actuation inhaler Take 1 Puff by inhalation every four (4) hours as needed for Wheezing or Cough. 1 Inhaler 3   ??? diphenhydrAMINE (BenadryL) 25 mg capsule Take 25 mg by mouth every six (6) hours as needed.     ??? ZINC PO Take  by mouth.     ??? thiamine HCl (VITAMIN B-1 PO) Take  by mouth.     ??? ascorbic acid (VITAMIN C PO) Take  by mouth.     ??? atorvastatin (LIPITOR) 20 mg tablet Take 1 Tab by mouth daily. 90 Tab 2   ??? BIOTIN PO Take 1 Tab by mouth daily.     ??? calcium carb/vit D3/minerals (CALTRATE 600+D PLUS MINERALS PO) Take  by mouth daily.     ??? OneTouch Ultra Blue Test Strip strip TEST 4 TIMES A DAY     ??? Nano Pen Needle 32 gauge x 5/32" ndle INJECT 4 TIMES A DAY WITH MEALS AND AT BEDTIME     ??? triamcinolone acetonide (KENALOG) 0.1 % topical cream APPLY TO AFFECTED AREA TWICE A DAY AS NEEDED FOR RASH     ??? levocetirizine dihydrochloride (XYZAL PO) Take  by mouth.     ??? RESVERATROL-QUERCETIN PO Take  by mouth.     ??? albuterol (PROVENTIL VENTOLIN) 2.5 mg /3 mL (0.083 %) nebu 3 mL by Nebulization route every four (4) hours as needed (wheezing). 120 Nebule 1   ??? magnesium oxide (MAG-OX) 400 mg tablet Take 800 mg by mouth two (2) times a day.     ??? pantoprazole (Protonix) 40 mg tablet Take 40 mg by mouth two (2) times a day.     ??? prochlorperazine (COMPAZINE) 10 mg tablet Take 10 mg by mouth every four (4) hours as needed.     ??? nilotinib (TASIGNA) 200 mg cap capsule Take 200 mg by mouth two (2) times a day.     ??? Magnesium Oxide-Mg AA Chelate (MG-PLUS) 133 mg tab Take 1 Tab by mouth  two (2) times a day.     ??? cholecalciferol, VITAMIN D3, (VITAMIN D3) 5,000 unit tab tablet Take 5,000 Units by mouth daily.     ??? acyclovir (ZOVIRAX) 400 mg tablet Take 800 mg by mouth two (2) times a day.     ??? zolpidem (AMBIEN) 5 mg tablet Take 5 mg by mouth nightly.     ??? DULoxetine (CYMBALTA) 30 mg capsule Take 1 Capsule by mouth two (2) times a day. 180 Capsule 3     Allergies   Allergen Reactions   ??? Pcn [Penicillins] Hives and Itching       Family History   Problem Relation Age of Onset   ??? Breast Cancer Mother      Social History     Tobacco Use   ??? Smoking status: Never Smoker   ??? Smokeless tobacco: Never Used   Substance Use Topics   ??? Alcohol use: No       Blanch Media, MD

## 2019-12-28 NOTE — Progress Notes (Signed)
1. Have you been to the ER, urgent care clinic or hospitalized since your last visit? [facility]- bronchitis    2. Have you seen or consulted any other health care providers outside of the [place] since your last visit (Include any pap smears or colon screening)? NO

## 2019-12-28 NOTE — Telephone Encounter (Signed)
Script sent to [facility].

## 2019-12-28 NOTE — ACP (Advance Care Planning) (Signed)
Advance Care Planning     Advance Care Planning (ACP) Physician/NP/PA Conversation      Date of Conversation: 12/28/2019  Conducted with: Patient with Decision Making Capacity    Healthcare Decision Maker:     Primary Decision Maker: [name] - Brother - [phone]    Secondary Decision Maker: Karleen Dolphin - Mother - [phone]  Click here to complete [place] including selection of the Healthcare Decision Maker Relationship (ie "Primary")  Today we documented Decision Maker(s) consistent with Legal Next of Kin hierarchy.    Care Preferences:    Hospitalization: "If your health worsens and it becomes clear that your chance of recovery is unlikely, what would be your preference regarding hospitalization?"  The patient would prefer hospitalization.    Ventilation: "If you were unable to breathe on your own and your chance of recovery was unlikely, what would be your preference about the use of a ventilator (breathing machine) if it was available to you?"   The patient would desire the use of a ventilator.    Resuscitation: "In the event your heart stopped as a result of an underlying serious health condition, would you want attempts to be made to restart your heart, or would you prefer a natural death?"   Yes, attempt to resuscitate.    Additional topics discussed: treatment goals, benefit/burden of treatment options, ventilation preferences, hospitalization preferences, resuscitation preferences and end of life care preferences (vegetative state/imminent death)    Conversation Outcomes / Follow-Up Plan:   ACP in process - information provided, considering goals and options  Reviewed DNR/DNI and patient elects Full Code (Attempt Resuscitation)     Length of Voluntary ACP Conversation in minutes:  16 minutes    Blanch Media, MD

## 2019-12-28 NOTE — Telephone Encounter (Signed)
This med is listed as historical. Please sign if appropriate.    Last Visit: 09/16/19 with MD Felipe Drone  Next Appointment: 01/25/20 with MD Felipe Drone    Requested Prescriptions     Pending Prescriptions Disp Refills   . DULoxetine (CYMBALTA) 30 mg capsule 180 Capsule 1     Sig: Take 1 Capsule by mouth two (2) times a day.

## 2020-01-02 LAB — OXYCODONE, CONFIRM
Oxycodone confirm: 688 ng/mL
Oxycodone/Oxymorphone: POSITIVE — AB
Oxycodone: POSITIVE — AB
Oxymorphone confirm: 425 ng/mL
Oxymorphone: POSITIVE — AB

## 2020-01-02 LAB — PAIN MGMT PANEL, URINE W/RFLX CONFIRM
Amphetamine Screen, urine: NEGATIVE ng/mL
Barbiturates Screen, urine: NEGATIVE ng/mL
Benzodiazepines Screen, urine: NEGATIVE ng/mL
Cannabinoid Screen, urine: NEGATIVE ng/mL
Cocaine Metab. Screen, urine: NEGATIVE ng/mL
Creatinine, urine: 104.1 mg/dL (ref 20.0–300.0)
Fentanyl Screen, urine: NEGATIVE pg/mL
Meperidine Screen, urine: NEGATIVE ng/mL
Methadone Screen, urine: NEGATIVE ng/mL
Phencyclidine Screen, urine: NEGATIVE ng/mL
Propoxyphene Screen, urine: NEGATIVE ng/mL
Specific Gravity: 1.008
Tramadol Screen, urine: NEGATIVE ng/mL
pH, urine: 7.4 (ref 4.5–8.9)

## 2020-01-02 LAB — OPIATES CONFIRMATION, URINE: Opiates: NEGATIVE ng/mL

## 2020-01-02 LAB — PAIN MGMT PANEL W/REFL,UR
Amphetamine Screen, Ur: NEGATIVE ng/mL
Barbiturate Screen, Ur: NEGATIVE ng/mL
Benzodiazepine Screen, Urine: NEGATIVE ng/mL
COCAINE (METAB.) SCREEN, URINE, 737751: NEGATIVE ng/mL
Cannabinoid Scrn, Ur: NEGATIVE ng/mL
Creatinine, Ur: 104.1 mg/dL (ref 20.0–300.0)
FENTANYL SCREEN, URINE, 761061: NEGATIVE pg/mL
MEPERIDINE SCREEN, URINE, 761026: NEGATIVE ng/mL
Methadone Screen, Urine: NEGATIVE ng/mL
Phencyclidine (PCP), Screen, Urine: NEGATIVE ng/mL
Propoxyphene Screen, Urine: NEGATIVE ng/mL
Specific Gravity, UA: 1.008
Tramadol Screen, Urine: NEGATIVE ng/mL
pH, Urine: 7.4 (ref 4.5–8.9)

## 2020-01-03 ENCOUNTER — Ambulatory Visit: Payer: MEDICARE | Primary: Internal Medicine

## 2020-01-18 ENCOUNTER — Encounter: Payer: MEDICARE | Primary: Internal Medicine

## 2020-01-25 ENCOUNTER — Encounter: Payer: MEDICARE | Attending: Internal Medicine | Primary: Internal Medicine

## 2020-01-26 ENCOUNTER — Ambulatory Visit: Payer: MEDICARE | Primary: Internal Medicine

## 2020-02-03 ENCOUNTER — Inpatient Hospital Stay: Payer: MEDICARE | Attending: Internal Medicine | Primary: Internal Medicine

## 2020-02-03 ENCOUNTER — Inpatient Hospital Stay: Admit: 2020-02-03 | Payer: MEDICARE | Attending: Internal Medicine | Primary: Internal Medicine

## 2020-02-03 ENCOUNTER — Encounter

## 2020-02-03 DIAGNOSIS — R928 Other abnormal and inconclusive findings on diagnostic imaging of breast: Secondary | ICD-10-CM

## 2020-02-11 ENCOUNTER — Telehealth

## 2020-02-11 ENCOUNTER — Encounter

## 2020-02-11 NOTE — Telephone Encounter (Signed)
MAM Results (most recent):  Results from [place] encounter on 02/03/20    MAM 3D TOMO W MAMMO BI DX INCL CAD    Narrative  STUDY:  Diagnostic Mammogram, Right with Tomosynthesis    INDICATION:  Surveillance of probably benign findings within the right breast.    COMPARISON: May 12, 2019; March 05, 2019    FINDINGS: Standard diagnostic views of the right breast    Impression  Similar appearance of the 3 masses previously identified within the right  breast, lower central right breast middle depth, posterior lower central right  breast and posterior upper outer right breast. No new mass, distortion or  microcalcifications are seen.    Breast density: C - The breast parenchymal pattern is heterogeneously dense,  which may diminish the sensitivity of mammography.    Stable appearance of mammographic masses within the right breast initially  identified on baseline mammogram of March 05, 2019. Ultrasound is needed to  complete the evaluation. The patient reportedly could not stay and will need to  be rescheduled. Until that ultrasound is performed, the evaluation is considered  incomplete.    BIRADS 0:  Incomplete - Need Additional Imaging Evaluation and/or Prior  Mammograms for Comparison        Please advise patient that additional imaging was needed for her mammogram on 02/03/2020.  Recommendation was to proceed with ultrasound to complete the evaluation of the right breast abnormalities.  Order has been placed.  Please ask her to schedule.

## 2020-02-14 NOTE — Telephone Encounter (Signed)
Pt aware of message below and verbalized understanding. Phone number to scheduling provided. No further questions or concerns from pt at this time.

## 2020-02-24 ENCOUNTER — Telehealth: Attending: Internal Medicine | Primary: Internal Medicine

## 2020-02-24 ENCOUNTER — Telehealth: Admit: 2020-02-24 | Discharge: 2020-02-24 | Payer: MEDICARE | Attending: Internal Medicine | Primary: Internal Medicine

## 2020-02-24 DIAGNOSIS — G620 Drug-induced polyneuropathy: Secondary | ICD-10-CM

## 2020-02-24 MED ORDER — PREGABALIN 75 MG CAP
75 mg | ORAL_CAPSULE | Freq: Two times a day (BID) | ORAL | 0 refills | Status: DC
Start: 2020-02-24 — End: 2020-03-28

## 2020-02-24 NOTE — Telephone Encounter (Signed)
Please request well woman exam and Pap smear results from NP Audie Box at [facility]. Patient reports being seen on 02/15/2020.    Thanks.

## 2020-02-24 NOTE — Progress Notes (Signed)
JAYLEENE GLAESER is a 59 y.o. female who was seen by synchronous (real-time) audio-video technology on 02/24/2020.    HPI:   [name] Wu is a 59 y.o. year old female who presents today for an acute visit. She has a history of acute lymphoblastic leukemia, s/p stem cell transplant, hypertension, hyperlipidemia, diabetes mellitus, peripheral neuropathy, asthma, GERD, insomnia, and osteoarthritis. She is continuing to follow with the [facility] in [place] and travels there every 3 months for exam.  She has completed the [place] COVID-19 vaccine series, although has not yet received a third dose given in me know suppressed status. She presents today complaining of worsening bilateral foot pain with coldness and burning discomfort as well as cramping in her feet at night.  She states that the degree of pain increases to a 9/10 pain severity at night, but is more tolerable during the day, ranging 3-4/10 pain severity.  She was last seen on 12/28/2019 and received a refill of Roxicodone which is usually prescribed by pain management.  She states that she ran out of the medication approximately 2 weeks ago and noted that she felt she was experiencing some withdrawal symptoms so has decided not to resume taking.  She presents today requesting for alternate medication to help with her pain and wishes to avoid narcotics.  She is continuing to take duloxetine 30 mg twice daily and meloxicam 15 mg daily without adequate control of her foot pain.  She is planning to return to [place] later this month for  follow-up with her oncologist. She also reports that she is continuing on her [place]. She is otherwise without new complaints and feeling generally well.       Summary of prior hospitalizations and medical history:   She has a history of acute lymphoblastic leukemia, diagnosed in 02/2017 after experiencing severe fatigue and bone pain for several months. She was positive for the  [place] chromosome mutation. She presented to the [facility] in [place] for care, and underwent chemotherapy which achieved remission. She subsequently underwent a stem cell transplant on 08/14/2017, felt to be curative. She is now on maintenance therapy with a tyrosine kinase inhibitor, nilotimib 200 mg bid. She is followed by Dr. Noemi Chapel , transplant oncologist, and travels to [facility] every three months for care as discussed. She is also followed locally by Dr. Edd Arbour.     He has a history of hypertension, treated with amlodipine. She has a history of hyperlipidemia, and was restarted on atorvastatin in 07/2019.  She also has a history of diabetes mellitus, currently being treated with [place] and Humalog. She denies any polyuria, polydipsia, nocturia, or blurry vision, and has no history of retinopathy or nephropathy. 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 She is currently under the care of pain management and is being treated with Cymbalta and Roxicodone as needed.     She has a history of asthma since childhood, and is being maintained on Flovent and albuterol as needed. She denies any cough or shortness of breath.     In 04/2018, she was admitted to [facility] from 12/4-12/10/2017 for a UTI and then subsequently readmitted from 12/6-12/17/2019 with MRSE bacteremia related to a PICC line and bibasilar pneumonia.     He has a history of GERD, treated with Protonix. She also underwent a screening colonoscopy in 05/2018 in [place]. Report unavailable. She denies any  abdominal pain, nausea, vomiting, melena, hematochezia, or change in bowel movements.      Past Medical History:   Diagnosis Date   ??? ALL (acute lymphoblastic leukemia) ([place]) 02/2017    s/p stem cell transplant 08/2017; [facility] in [place]   ??? Diabetes ([place])    ??? GERD (gastroesophageal reflux disease) 03/18/2019   ??? 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 H/O stem cell transplant ([place]) 041/04/19   ??? Hyperlipidemia 03/22/2019   ??? Hypertension    ??? Insomnia 03/22/2019   ??? Mild intermittent asthma without complication 16/05/958   ??? Peripheral neuropathy due to chemotherapy ([place]) 03/18/2019     No past surgical history on file.  Current Outpatient Medications   Medication Sig   ??? pregabalin (LYRICA) 75 mg capsule Take 1 Capsule by mouth two (2) times a day. Max Daily Amount: 150 mg. Indications: neuropathic pain   ??? DULoxetine (CYMBALTA) 30 mg capsule Take 1 Capsule by mouth two (2) times a day.   ??? Flovent HFA 220 mcg/actuation inhaler TAKE 2 PUFFS BY INHALATION TWO (2) TIMES A DAY. INDICATIONS: CONTROLLER MEDICATION FOR ASTHMA   ??? amLODIPine (NORVASC) 5 mg tablet Take 1 Tablet by mouth daily.   ??? potassium chloride SR (K-Tab) 20 mEq tablet Take 2 Tablets by mouth daily.   ??? insulin degludec Tyler Aas FlexTouch U-200) 200 unit/mL (3 mL) inpn pen 33 Units by SubCUTAneous route daily.   ??? insulin lispro (HumaLOG Junior KwikPen U-100) 100 unit/mL inph For fasting blood glucose: 180-200 2 U; 201-220 4 U; 221-240 5 U; 241-280 6 U; 281-320 7 U; 321-400 8 U; >400 8 U and call  Indications: sliding scale   ??? albuterol (PROVENTIL HFA, VENTOLIN HFA, PROAIR HFA) 90 mcg/actuation inhaler Take 1 Puff by inhalation every four (4) hours as needed for Wheezing or Cough.   ??? diphenhydrAMINE (BenadryL) 25 mg capsule Take 25 mg by mouth every six (6) hours as needed.   ??? ZINC PO Take  by mouth.   ??? thiamine HCl (VITAMIN B-1 PO) Take  by mouth.   ??? ascorbic acid (VITAMIN C PO) Take  by mouth.   ??? atorvastatin (LIPITOR) 20 mg tablet Take 1 Tab by mouth daily.   ??? BIOTIN PO Take 1 Tab by mouth daily.   ??? calcium carb/vit D3/minerals (CALTRATE 600+D PLUS MINERALS PO) Take  by mouth daily.   ??? OneTouch Ultra Blue Test Strip strip TEST 4 TIMES A DAY   ??? Nano Pen Needle 32 gauge x 5/32" ndle INJECT 4 TIMES A DAY WITH MEALS AND AT BEDTIME   ??? triamcinolone acetonide  (KENALOG) 0.1 % topical cream APPLY TO AFFECTED AREA TWICE A DAY AS NEEDED FOR RASH   ??? levocetirizine dihydrochloride (XYZAL PO) Take  by mouth.   ??? RESVERATROL-QUERCETIN PO Take  by mouth.   ??? albuterol (PROVENTIL VENTOLIN) 2.5 mg /3 mL (0.083 %) nebu 3 mL by Nebulization route every four (4) hours as needed (wheezing).   ??? magnesium oxide (MAG-OX) 400 mg tablet Take 800 mg by mouth two (2) times a day.   ??? pantoprazole (Protonix) 40 mg tablet Take 40 mg by mouth two (2) times a day.   ??? prochlorperazine (COMPAZINE) 10 mg tablet Take 10 mg by mouth every four (4) hours as needed.   ??? nilotinib (TASIGNA) 200 mg cap capsule Take 200 mg by mouth two (2) times a day.   ??? Magnesium Oxide-Mg AA Chelate (MG-PLUS) 133  mg tab Take 1 Tab by mouth two (2) times a day.   ??? cholecalciferol, VITAMIN D3, (VITAMIN D3) 5,000 unit tab tablet Take 5,000 Units by mouth daily.   ??? acyclovir (ZOVIRAX) 400 mg tablet Take 800 mg by mouth two (2) times a day.   ??? zolpidem (AMBIEN) 5 mg tablet Take 5 mg by mouth nightly.     No current facility-administered medications for this visit.     Allergies and Intolerances:   Allergies   Allergen Reactions   ??? Pcn [Penicillins] Hives and Itching     Family History: She has no family history of colon cancer of leukemia. Mother had breast cancer at the age of 4.   Family History   Problem Relation Age of Onset   ??? Breast Cancer Mother      Social History:   She  reports that she has never smoked. She has never used smokeless tobacco. She is divorced and has no children. She is a Optometrist, and working virtually due to the pandemic.     Social History     Substance and Sexual Activity   Alcohol Use No     Immunization History:  Immunization History   Administered Date(s) Administered   ??? Covid-19, MODERNA, Mrna, Lnp-s, Pf, 160mg/0.5mL 06/16/2019, 07/14/2019   ??? Influenza Vaccine 02/27/2018, 02/23/2019   ??? Pneumococcal Polysaccharide (PPSV-23) 03/18/2019   ??? Tdap 03/18/2019       Review of Systems:    As above included in HPI.  Otherwise 11 point review of systems negative including constitutional, skin, HENT, eyes, respiratory, cardiovascular, gastrointestinal, genitourinary, musculoskeletal, endocrine, hematologic, allergy, and neurologic.    Physical:   There were no vitals taken for this visit.      Constitutional: _0  Appears well-developed and well-nourished _1  No apparent distress      _2  Abnormal -     Mental status: _3  Alert and awake  _4  Oriented to person/place/time _5  Able to follow commands    _6  Abnormal -     Eyes:   EOM    _7   Normal    _8  Abnormal -   Sclera  _9   Normal    _10  Abnormal -          Discharge _11   None visible   _12  Abnormal -     HENT: <<GZFPOIPPGFQMKJIZ>_1<\/YOFVWAQLRJPVGKKD>_59 Normocephalic, atraumatic  <<ELMRAJHHIDUPBDHD>_8<\/XBOERQSXQKSKSHNG>_87 Abnormal -   _15  Mouth/Throat: Mucous membranes are moist    External Ears _16  Normal  _17  Abnormal -    Neck: _18  No visualized mass _19  Abnormal -     Pulmonary/Chest: _20  Respiratory effort normal   _21  No visualized signs of difficulty breathing or respiratory distress        _22  Abnormal -      Musculoskeletal:   _23  Normal gait with no signs of ataxia         _24  Normal range of motion of neck        _25  Abnormal -     Neurological:        _26  No Facial Asymmetry (Cranial nerve 7 motor function) (limited exam due to video visit)          _27  No gaze palsy        _28  Abnormal -          Skin:        _29  No significant exanthematous lesions or discoloration noted on facial skin         _30  Abnormal -  Psychiatric:       _0  Normal Affect _1  Abnormal -        _2  No Hallucinations    Other pertinent observable physical exam findings:-             Review of Data:  Labs:  No visits with results within 2 Day(s) from this visit.   Latest known visit with results is:   Hospital Outpatient Visit on 12/28/2019   Component Date Value Ref Range Status   ??? Amphetamine Screen, urine 12/28/2019 Negative  Cutoff=1000 ng/mL Final   ??? Barbiturates Screen, urine 12/28/2019 Negative  Cutoff=200 ng/mL Final   ???  Benzodiazepines Screen, urine 12/28/2019 Negative  Cutoff=200 ng/mL Final   ??? Cannabinoid Screen, urine 12/28/2019 Negative  Cutoff=20 ng/mL Final   ??? Cocaine Metab. Screen, urine 12/28/2019 Negative  Cutoff=300 ng/mL Final   ??? Opiate Screen, urine 12/28/2019 See Final Results  Cutoff=300 ng/mL Final   ??? Oxycodone/Oxymorphone, urine 12/28/2019 See Final Results  Cutoff=100 ng/mL Final   ??? Phencyclidine Screen, urine 12/28/2019 Negative  Cutoff=25 ng/mL Final   ??? Methadone Screen, urine 12/28/2019 Negative  Cutoff=300 ng/mL Final   ??? Propoxyphene Screen, urine 12/28/2019 Negative  Cutoff=300 ng/mL Final   ??? Meperidine Screen, urine 12/28/2019 Negative  Cutoff=200 ng/mL Final   ??? Fentanyl Screen, urine 12/28/2019 Negative  Cutoff=2000 pg/mL Final   ??? Tramadol Screen, urine 12/28/2019 Negative  Cutoff=200 ng/mL Final   ??? Creatinine, urine 12/28/2019 104.1  20.0 - 300.0 mg/dL Final   ??? Specific Gravity 12/28/2019 1.008    Final   ??? pH, urine 12/28/2019 7.4  4.5 - 8.9   Final   ??? Please Note 12/28/2019 Comment    Final   ??? Opiates 12/28/2019 Negative  Cutoff=300 ng/mL Final   ??? Oxycodone/Oxymorphone 12/28/2019 Positive* Cutoff=100   Final   ??? Oxycodone 12/28/2019 Positive*   Final   ??? Oxycodone confirm 12/28/2019 688  Cutoff=100 ng/mL Final   ??? Oxymorphone 12/28/2019 Positive*   Final   ??? Oxymorphone confirm 12/28/2019 425  Cutoff=100 ng/mL Final       Calculated 10 year ASCVD risk score: 13.9 %    Health Maintenance:  Screening:    Mammogram: Incomplete (02/03/2020) right ultrasound needed.   PAP smear: Completed well woman exam with NP Meyer on 02/15/2020   Colorectal: colonoscopy (05/2018) in [place].    Depression: none   DM (HbA1c/FPG): HbA1c 7.1 (06/2019)   Hepatitis C: negative (03/2019)   Falls: none   DEXA: unknown   Glaucoma: Scheduled with Dr. Faustino Congress   Smoking: none   Vitamin D: 42.8 (03/2019)   Medicare Wellness: 12/28/2019        Impression:  Patient Active Problem List   Diagnosis Code   ??? ALL (acute lymphoid  leukemia) in remission ([place]) C91.01   ??? S/P bone marrow transplant ([place]) Z94.81   ??? Essential hypertension I10   ??? Type 2 diabetes mellitus, with long-term current use of insulin ([facility]) E11.9, Z79.4   ??? Peripheral neuropathy due to chemotherapy ([place]) G62.0, T45.1X5A   ??? Mild intermittent asthma without complication N82.95   ??? GERD (gastroesophageal reflux disease) K21.9   ??? Eczema L30.9   ??? Insomnia G47.00   ??? 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 Hyperlipidemia E78.5   ??? Abnormal mammogram of right breast R92.8   ??? Overweight (BMI 25.0-29.9) E66.3   ??? Elevated alkaline phosphatase level R74.8   ??? 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 Acute lymphoblastic leukemia, +[place] chrom mutation, s/p stem cell transplant. Diagnosed in 02/2017 and underwent induction chemotherapy followed by stem cell transplant in 08/2017. Now on maintenance therapy with a TKI (nilotinib). Being followed closely by Dr. Noemi Chapel, transplant oncologist, at [facility] in [place] with monthly blood draws and travel to [place] every 3 months for evaluation. Has also been followed locally by Dr. Edd Arbour in the past but not returned recently.  Next visit to [facility] scheduled for 02/2020.    2. Hypertension. Blood pressure has been well controlled on amlodipine 5 mg daily. Renal function has been normal with creatinine 1.04/ eGFR >60. Will continue to monitor. On potassium and magnesium supplements due to long term difficulty with deficiency. Stable levels when last checked in 06/2019. Will consider changing treatment to Ace-I/ARB given renoprotective effect in diabetes mellitus. Will address at next visit.   3. Hyperlipidemia. Calculated 10 year ASCVD risk is 13.9 %, which placed her in one of the four statin benefit groups as per new AHA/ACC guidelines (primary prevention: 10 year ASCVD risk >7.5%). Patient also a diabetic. Started treatment with atorvastatin 20 mg daily in 03/2019, and  repeat lipid panel with LDL 87 and HDL 63 in 06/2019. Emphasized importance of lifestyle modifications, including diet, exercise, and weight loss. Will recheck lipid panel at next visit. Continue to follow.  4. Diabetes mellitus. Previously well controlled on regimen of Tresiba 30 units daily and SS Humalog with HbA1c 6.5 in 03/2019. However, increased to HbA1c 7.1 in 06/2019, likely related to weight gain. Reported blood sugars had been in the 200's at her prior visit on 09/01/2019, and advised to titrate [place] achieve fasting blood sugar of <150. Reports continuing to use Tresiba 33 U with fasting blood sugars 130???140 currently. Will consider addition of GLP-1 agonist at next visit. No evidence of microvascular complications. Started on statin but not on Ace-I. Referred to Dr. Faustino Congress for eye exam, and scheduled in 01/2020.  Will follow up at next visit to ascertain if completed.  Foot exam grossly normal (08/2019), and no evidence of microalbuminuria with urine microalbumin/ creatinine ratio 18 mg/g. Emphasized importance of lifestyle modifications, including diet, exercise, and weight loss.   5. Peripheral neuropathy, painful. Thought to be secondary to chemotherapy. Did not tolerate gabapentin due to hallucinations. On Cymbalta 30 mg bid and Roxicodone through pain management.  Last seen in 12/2019 in order to request refill of Roxicodone since pain management required an in person visit every 6 months in order to refill.  However, reports that not scheduled to return to [facility] until October 2021.  PMP reviewed urine drug screen obtained, and prescribed refill for Roxicodone.  Presents today and reports that ran out of Roxicodone approximately 2 weeks ago and not wishing to resume treatment since concerned that may be becoming addicted to it. Reports severe pain in feet at night with burning discomfort and cramping.  Will attempt trial of pregabalin 75 mg twice daily.  Advised to call if no improvement.  6.  Asthma, mild intermittent.  On Flovent for maintenance therapy and albuterol as needed.  Developed recent URI after returning from a trip to [place] and evaluated by Dr. Madelyn Brunner on 11/26/2019 and was prescribed Tessalon and azithromycin.  However did not note improvement and presented to urgent care after completion.  Reports tested for COVID-19 and was negative, and chest x-ray was negative for pneumonia.  Prescribed doxycycline for 14 days and reports resolution.  Reports stable symptoms today.  7. GERD. On Protonix 40 mg bid with reasonably good control. Follow.  8. Abnormal mammogram. Patient reports had previous biopsy of right breast which was benign. Screening mammogram (02/2019) showed multiple abnormalities in right breast. Right breast diagnostic mammogram and ultrasound (05/12/2019) showed probably benign findings, but recommendation was to proceed with short interval 6 month follow-up (due 10/2019).  Underwent repeat diagnostic mammogram on 02/03/2020, the patient left prior to having ultrasound performed so not completed.  Urged to schedule.  Patient with FH history of breast cancer in her mother.  9. Left trigger thumb. Evaluated by Dr. Redmond Pulling in 04/2019 and received a cortisone injection with improvement.   10. Elevated alkaline phosphatase. Unclear if related to transplant and bone marrow process. Will request records to see if has been elevated previously. However, GGT elevated at 81 in 06/17/2019 so likely of hepatic origin. Also, will check AMA and alkaline phosphatase isozymes at next visit, and discuss proceeding with right upper quadrant ultrasound if persists.  11. Insomnia. Patient using Ambien +/- Benadryl as needed. Will emphasize good sleep hygiene.  Continue to monitor.  12. Eczema. On Xyzal and will use Kenalog as needed.   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 Patient reported 6 day history of intermittent dizziness/ vertigo which worsened with position change. Associated with nausea. History of severe  motion sickness on cruises and when sitting in the back seat of a car. Treated with meclizine 25 mg tid prn and provided with handouts for Epley maneuver. Reports improvement with complete resolution. Will continue to monitor.  14.  Obesity. Weight had decreased 11 pounds in 12/2019, and patient reports that she is now enrolled in the diabetes [employer] plan.  She reports today that she is continuing to attempt weight loss and continues to be enrolled in the plan.. Emphasized importance of continuing with lifestyle modifications, including diet, exercise, and weight loss.  15. Health maintenance. Completed Moderna COVID 19 vaccine series.  Discussed need for third dose given immunosuppression and urged to proceed. Given script for Shingrix vaccine but not yet obtained.  Advised to obtain the influenza vaccine.  Other immunizations up-to-date.  Mammogram as discussed. Will attempt to request colonoscopy report. Referred for well woman exam and reports now completed by NP Lowell Guitar.  Will request report.  Referred to Dr. Faustino Congress for eye exams and will follow up at next visit to see if completed in 01/2020 as scheduled.  Vitamin D level has been normal on maintenance dose supplement. Emphasized importance of lifestyle modifications, including diet, exercise, and weight loss. Initial Medicare wellness visit completed.     Patient understands recommendations and agrees with plan.  Follow-up as previously scheduled.    We discussed the expected course, resolution and complications of the diagnosis(es) in detail.  Medication risks, benefits, costs, interactions, and alternatives were discussed as indicated.  I advised her to contact the office if her condition worsens, changes or fails to improve as anticipated. She expressed understanding with the diagnosis(es) and plan.     Blenda Bridegroom, was evaluated through a synchronous (real-time) audio-video encounter. The patient (or guardian if applicable) is aware  that this is a billable service. Verbal consent to proceed has been obtained within the past 12 months. The visit was conducted pursuant to the emergency declaration under the [place], [place] waiver authority and the [employer] and [employer] Act.  Patient identification was verified, and a caregiver was present when appropriate. The patient was located in a state  where the provider was credentialed to provide care.    Blanch Media, MD

## 2020-02-25 NOTE — Telephone Encounter (Signed)
Records have been requested

## 2020-03-03 MED ORDER — CYCLOBENZAPRINE 10 MG TAB
10 mg | ORAL_TABLET | Freq: Three times a day (TID) | ORAL | 0 refills | Status: DC | PRN
Start: 2020-03-03 — End: 2020-03-28

## 2020-03-03 NOTE — Telephone Encounter (Signed)
Pt states Lyrica was causing muscle cramps so pharmacist told her to take the Flexeril 10mg  she already had on hand from her oncologist and she said that worked fine.  She is asking for a new rx for that sent to [facility] on [address]

## 2020-03-03 NOTE — Telephone Encounter (Signed)
Prescription [facility] to [facility].

## 2020-03-28 ENCOUNTER — Encounter

## 2020-03-28 MED ORDER — PREGABALIN 75 MG CAP
75 mg | ORAL_CAPSULE | Freq: Two times a day (BID) | ORAL | 1 refills | Status: DC
Start: 2020-03-28 — End: 2020-06-22

## 2020-03-28 MED ORDER — ATORVASTATIN 20 MG TAB
20 mg | ORAL_TABLET | ORAL | 3 refills | Status: DC
Start: 2020-03-28 — End: 2021-03-27

## 2020-03-28 MED ORDER — CYCLOBENZAPRINE 10 MG TAB
10 mg | ORAL_TABLET | Freq: Three times a day (TID) | ORAL | 0 refills | Status: DC | PRN
Start: 2020-03-28 — End: 2020-04-24

## 2020-03-28 NOTE — Telephone Encounter (Signed)
 [place] the last fill date for Lyrica  as 02/24/20 for a 30 d/s.     UDS done 12/28/19    Last Visit: 02/24/20 with MD Chet  Next Appointment: 05/11/20 with MD Sarris  Previous Refill Encounter(s): 02/24/20 #60    Requested Prescriptions     Pending Prescriptions Disp Refills   . pregabalin  (LYRICA ) 75 mg capsule [Pharmacy Med Name: PREGABALIN  75 MG CAPSULE] 60 Capsule 1     Sig: TAKE 1 CAPSULE BY MOUTH TWO (2) TIMES A DAY. MAX DAILY AMOUNT: 150 MG. INDICATIONS: NEUROPATHIC PAIN     Signed Prescriptions Disp Refills   . cyclobenzaprine  (FLEXERIL ) 10 mg tablet 30 Tablet 0     Sig: TAKE 1 TABLET BY MOUTH THREE (3) TIMES DAILY AS NEEDED FOR MUSCLE SPASM(S).     Authorizing Provider: SARRIS, BARBARA M   . atorvastatin  (LIPITOR) 20 mg tablet 90 Tablet 3     Sig: TAKE 1 TABLET BY MOUTH EVERY DAY     Authorizing Provider: SARRIS, BARBARA M

## 2020-04-24 MED ORDER — CYCLOBENZAPRINE 10 MG TAB
10 mg | ORAL_TABLET | ORAL | 0 refills | Status: DC
Start: 2020-04-24 — End: 2020-05-31

## 2020-04-28 NOTE — Telephone Encounter (Signed)
Please change instructions to Tresiba 32 units SC daily.

## 2020-04-28 NOTE — Telephone Encounter (Signed)
 Pharmacy was contacted and given the updated sig of 32 units daily. No further action needed.

## 2020-04-28 NOTE — Telephone Encounter (Signed)
Pharmacy is asking for clarification on the Tresiba. They advise that the 200u/ml can only be dispensed in EVEN increments. Odd number units can only be dispensed if the 100u/ml is used. Please advise.    [facility] [phone]  Order #[id number]

## 2020-05-03 ENCOUNTER — Ambulatory Visit: Payer: MEDICARE | Primary: Internal Medicine

## 2020-05-04 ENCOUNTER — Inpatient Hospital Stay: Admit: 2020-05-04 | Payer: MEDICARE | Primary: Internal Medicine

## 2020-05-04 ENCOUNTER — Ambulatory Visit: Admit: 2020-05-04 | Discharge: 2020-05-04 | Payer: MEDICARE | Primary: Internal Medicine

## 2020-05-04 DIAGNOSIS — C9101 Acute lymphoblastic leukemia, in remission: Secondary | ICD-10-CM

## 2020-05-04 LAB — CBC WITH AUTOMATED DIFF
ABS. BASOPHILS: 0 10*3/uL (ref 0.0–0.1)
ABS. EOSINOPHILS: 0.2 10*3/uL (ref 0.0–0.4)
ABS. IMM. GRANS.: 0 10*3/uL (ref 0.00–0.04)
ABS. LYMPHOCYTES: 2.9 10*3/uL (ref 0.9–3.6)
ABS. MONOCYTES: 0.6 10*3/uL (ref 0.05–1.2)
ABS. NEUTROPHILS: 7.9 10*3/uL (ref 1.8–8.0)
ABSOLUTE NRBC: 0 10*3/uL (ref 0.00–0.01)
BASOPHILS: 0 % (ref 0–2)
EOSINOPHILS: 1 % (ref 0–5)
HCT: 42.4 % (ref 35.0–45.0)
HGB: 13.4 g/dL (ref 12.0–16.0)
IMMATURE GRANULOCYTES: 0 % (ref 0.0–0.5)
LYMPHOCYTES: 25 % (ref 21–52)
MCH: 30.2 PG (ref 24.0–34.0)
MCHC: 31.6 g/dL (ref 31.0–37.0)
MCV: 95.5 FL (ref 78.0–100.0)
MONOCYTES: 5 % (ref 3–10)
MPV: 10.2 FL (ref 9.2–11.8)
NEUTROPHILS: 68 % (ref 40–73)
NRBC: 0 PER 100 WBC
PLATELET: 247 10*3/uL (ref 135–420)
RBC: 4.44 M/uL (ref 4.20–5.30)
RDW: 13.5 % (ref 11.6–14.5)
WBC: 11.6 10*3/uL (ref 4.6–13.2)

## 2020-05-04 LAB — METABOLIC PANEL, COMPREHENSIVE
A-G Ratio: 1.1 (ref 0.8–1.7)
ALT (SGPT): 34 U/L (ref 13–56)
AST (SGOT): 19 U/L (ref 10–38)
Albumin: 3.9 g/dL (ref 3.4–5.0)
Alk. phosphatase: 167 U/L — ABNORMAL HIGH (ref 45–117)
Anion gap: 7 mmol/L (ref 3.0–18)
BUN/Creatinine ratio: 14 (ref 12–20)
BUN: 13 MG/DL (ref 7.0–18)
Bilirubin, total: 0.7 MG/DL (ref 0.2–1.0)
CO2: 30 mmol/L (ref 21–32)
Calcium: 9.5 MG/DL (ref 8.5–10.1)
Chloride: 105 mmol/L (ref 100–111)
Creatinine: 0.92 MG/DL (ref 0.6–1.3)
GFR est AA: >60 mL/min/{1.73_m2} (ref >60)
GFR est non-AA: >60 mL/min/{1.73_m2} (ref >60)
Globulin: 3.5 g/dL (ref 2.0–4.0)
Glucose: 92 mg/dL (ref 74–99)
Potassium: 3.8 mmol/L (ref 3.5–5.5)
Protein, total: 7.4 g/dL (ref 6.4–8.2)
Sodium: 142 mmol/L (ref 136–145)

## 2020-05-04 LAB — LIPID PANEL
CHOL/HDL Ratio: 2.5 (ref 0–5.0)
Chol/HDL Ratio: 2.5 (ref 0–5.0)
Cholesterol, Total: 163 MG/DL (ref <200)
Cholesterol, total: 163 MG/DL (ref <200)
HDL Cholesterol: 66 MG/DL — ABNORMAL HIGH (ref 40–60)
HDL: 66 MG/DL — ABNORMAL HIGH (ref 40–60)
LDL Calculated: 63.4 MG/DL (ref 0–100)
LDL, calculated: 63.4 MG/DL (ref 0–100)
Triglyceride: 168 MG/DL — ABNORMAL HIGH (ref <150)
Triglycerides: 168 MG/DL — ABNORMAL HIGH (ref <150)
VLDL Cholesterol Calculated: 33.6 MG/DL
VLDL, calculated: 33.6 MG/DL

## 2020-05-04 LAB — MICROALBUMIN, UR, RAND W/ MICROALB/CREAT RATIO
Creatinine, urine random: 95 mg/dL (ref 30–125)
Microalbumin,urine random: 1.01 MG/DL (ref 0–3.0)
Microalbumin/Creat ratio (mg/g creat): 11 mg/g (ref 0–30)

## 2020-05-04 LAB — HEMOGLOBIN A1C WITH EAG
Est. average glucose: 217 mg/dL
Hemoglobin A1c: 9.2 % — ABNORMAL HIGH (ref 4.2–5.6)

## 2020-05-04 LAB — MAGNESIUM
Magnesium: 2.2 mg/dL (ref 1.6–2.6)
Magnesium: 2.2 mg/dL (ref 1.6–2.6)

## 2020-05-04 LAB — VITAMIN D, 25 HYDROXY: Vitamin D 25-Hydroxy: 45 ng/mL (ref 30–100)

## 2020-05-04 LAB — CBC WITH AUTO DIFFERENTIAL
Basophils %: 0 % (ref 0–2)
Basophils Absolute: 0 10*3/uL (ref 0.0–0.1)
Eosinophils %: 1 % (ref 0–5)
Eosinophils Absolute: 0.2 10*3/uL (ref 0.0–0.4)
Granulocyte Absolute Count: 0 10*3/uL (ref 0.00–0.04)
Hematocrit: 42.4 % (ref 35.0–45.0)
Hemoglobin: 13.4 g/dL (ref 12.0–16.0)
Immature Granulocytes %: 0 % (ref 0.0–0.5)
Lymphocytes %: 25 % (ref 21–52)
Lymphocytes Absolute: 2.9 10*3/uL (ref 0.9–3.6)
MCH: 30.2 PG (ref 24.0–34.0)
MCHC: 31.6 g/dL (ref 31.0–37.0)
MCV: 95.5 FL (ref 78.0–100.0)
MPV: 10.2 FL (ref 9.2–11.8)
Monocytes %: 5 % (ref 3–10)
Monocytes Absolute: 0.6 10*3/uL (ref 0.05–1.2)
NRBC Absolute: 0 10*3/uL (ref 0.00–0.01)
Neutrophils %: 68 % (ref 40–73)
Neutrophils Absolute: 7.9 10*3/uL (ref 1.8–8.0)
Nucleated RBCs: 0 PER 100 WBC
Platelets: 247 10*3/uL (ref 135–420)
RBC: 4.44 M/uL (ref 4.20–5.30)
RDW: 13.5 % (ref 11.6–14.5)
WBC: 11.6 10*3/uL (ref 4.6–13.2)

## 2020-05-04 LAB — COMPREHENSIVE METABOLIC PANEL
ALT: 34 U/L (ref 13–56)
AST: 19 U/L (ref 10–38)
Albumin/Globulin Ratio: 1.1 (ref 0.8–1.7)
Albumin: 3.9 g/dL (ref 3.4–5.0)
Alkaline Phosphatase: 167 U/L — ABNORMAL HIGH (ref 45–117)
Anion Gap: 7 mmol/L (ref 3.0–18)
BUN/Creatinine Ratio: 14 (ref 12–20)
BUN: 13 MG/DL (ref 7.0–18)
CO2: 30 mmol/L (ref 21–32)
Calcium: 9.5 MG/DL (ref 8.5–10.1)
Chloride: 105 mmol/L (ref 100–111)
Creatinine: 0.92 MG/DL (ref 0.6–1.3)
GFR African American: >60 mL/min/{1.73_m2} (ref >60)
Globulin: 3.5 g/dL (ref 2.0–4.0)
Glucose: 92 mg/dL (ref 74–99)
Potassium: 3.8 mmol/L (ref 3.5–5.5)
Sodium: 142 mmol/L (ref 136–145)
Total Bilirubin: 0.7 MG/DL (ref 0.2–1.0)
Total Protein: 7.4 g/dL (ref 6.4–8.2)
eGFR NON-AA: >60 mL/min/{1.73_m2} (ref >60)

## 2020-05-04 LAB — MICROALBUMIN / CREATININE URINE RATIO
Creatinine, Ur: 95 mg/dL (ref 30–125)
Microalb, Ur: 1.01 MG/DL (ref 0–3.0)
Microalb/Creat Ratio: 11 mg/g (ref 0–30)

## 2020-05-04 LAB — HEMOGLOBIN A1C W/EAG
Estimated Avg Glucose: 217 mg/dL
Hemoglobin A1C: 9.2 % — ABNORMAL HIGH (ref 4.2–5.6)

## 2020-05-04 LAB — VITAMIN D 25 HYDROXY: Vit D, 25-Hydroxy: 45 ng/mL (ref 30–100)

## 2020-05-05 LAB — GGT
GGT: 97 U/L — ABNORMAL HIGH (ref 5–55)
GGT: 97 U/L — ABNORMAL HIGH (ref 5–55)

## 2020-05-06 LAB — ALK PHOS ISOENZYMES
Alkaline Phosphatase: 166 [IU]/L — ABNORMAL HIGH (ref 44–121)
Alkaline phosphatase: 166 IU/L — ABNORMAL HIGH (ref 44–121)
BONE FRACTION, 016240: 29 % (ref 14–68)
Bone fraction: 29 % (ref 14–68)
INTESTINAL FRACTION, 016241: 0 % (ref 0–18)
Intestinal fraction: 0 % (ref 0–18)
LIVER FRACTION, 016239: 71 % (ref 18–85)
Liver fraction: 71 % (ref 18–85)

## 2020-05-08 LAB — MITOCHONDRIAL M2 AB
Michochondrial (M2) Ab: <20 Units (ref 0.0–20.0)
Mitochondrial M2 Ab, IgG: <20 U (ref 0.0–20.0)

## 2020-05-11 ENCOUNTER — Ambulatory Visit: Attending: Internal Medicine | Primary: Internal Medicine

## 2020-05-11 ENCOUNTER — Telehealth

## 2020-05-11 ENCOUNTER — Encounter

## 2020-05-11 ENCOUNTER — Ambulatory Visit: Admit: 2020-05-11 | Discharge: 2020-05-11 | Payer: MEDICARE | Attending: Internal Medicine | Primary: Internal Medicine

## 2020-05-11 DIAGNOSIS — E1140 Type 2 diabetes mellitus with diabetic neuropathy, unspecified: Secondary | ICD-10-CM

## 2020-05-11 MED ORDER — ONDANSETRON 4 MG TAB, RAPID DISSOLVE
4 mg | ORAL_TABLET | Freq: Three times a day (TID) | ORAL | 0 refills | Status: AC | PRN
Start: 2020-05-11 — End: ?

## 2020-05-11 MED ORDER — LISINOPRIL 10 MG TAB
10 mg | ORAL_TABLET | Freq: Every day | ORAL | 2 refills | Status: DC
Start: 2020-05-11 — End: 2021-01-17

## 2020-05-11 MED ORDER — SEMAGLUTIDE 0.25 MG OR 0.5 MG (2 MG/1.5 ML) SUBCUTANEOUS PEN INJECTOR
0.25 mg or 0.5 mg(2 mg/1.5 mL) | PEN_INJECTOR | SUBCUTANEOUS | 3 refills | Status: DC
Start: 2020-05-11 — End: 2020-08-15

## 2020-05-11 NOTE — Progress Notes (Signed)
HPI:   [name] Wu is a 59 y.o. year old female who presents today for a routine visit. She has a history of acute lymphoblastic leukemia, s/p stem cell transplant, hypertension, hyperlipidemia, diabetes mellitus, peripheral neuropathy, asthma, GERD, insomnia, and osteoarthritis. She is continuing to follow with the [facility] in [place] and travels there every 3 months for exam.  She has completed the [place] COVID-19 vaccine series and received a third Moderna dose given immunosuppressed status.  She reports that she is doing reasonably well.  She states that she recently returned from [place] for follow-up visit at the cancer treatment center from 12/7???04/19/2020.  She was last seen on 02/24/2020 and complained of worsening bilateral foot pain with coldness and burning discomfort as well as cramping in her feet at night.  She had weaned from taking Roxicodone and was requesting an alternate medication to help with her pain as wishing to avoid continue treatment with narcotics.  She was continuing to take duloxetine 30 mg twice daily and meloxicam 15 mg daily without adequate control of her foot pain, and was started on pregabalin 75 mg twice daily.  She reports significant improvement after initiation and states that she will use cyclobenzaprine as needed to help with muscle cramping when it occurs.  She is otherwise without new complaints and feeling generally well.       Summary of prior hospitalizations and medical history:   She has a history of acute lymphoblastic leukemia, diagnosed in 02/2017 after experiencing severe fatigue and bone pain for several months. She was positive for the [place] chromosome mutation. She presented to the [facility] in [place] for care, and underwent chemotherapy which achieved remission. She subsequently underwent a stem cell transplant on 08/14/2017, felt to be curative. She is now on maintenance therapy with a tyrosine  kinase inhibitor, nilotimib 200 mg bid. She is followed by Dr. Noemi Chapel , transplant oncologist, and travels to [facility] every three months for care as discussed. She is also followed locally by Dr. Edd Arbour.     He has a history of hypertension, treated with amlodipine. She has a history of hyperlipidemia, and was restarted on atorvastatin in 07/2019.  She also has a history of diabetes mellitus, currently being treated with [place] and Humalog. She denies any polyuria, polydipsia, nocturia, or blurry vision, and has no history of retinopathy or nephropathy. 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 She is currently under the care of pain management and is being treated with Cymbalta and Roxicodone as needed.     She has a history of asthma since childhood, and is being maintained on Flovent and albuterol as needed. She denies any cough or shortness of breath.     In 04/2018, she was admitted to [facility] from 12/4-12/10/2017 for a UTI and then subsequently readmitted from 12/6-12/17/2019 with MRSE bacteremia related to a PICC line and bibasilar pneumonia.     He has a history of GERD, treated with Protonix. She also underwent a screening colonoscopy in 05/2018 in [place]. Report unavailable. She denies any abdominal pain, nausea, vomiting, melena, hematochezia, or change in bowel movements.      Past Medical History:   Diagnosis Date   ??? ALL (acute lymphoblastic leukemia) ([place]) 02/2017    s/p stem cell transplant 08/2017; [facility] in [place]   ??? Diabetes ([place])    ??? GERD (gastroesophageal reflux  disease) 03/18/2019   ??? 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 H/O stem cell transplant ([place]) 041/04/19   ??? Hyperlipidemia 03/22/2019   ??? Hypertension    ??? Insomnia 03/22/2019   ??? Mild intermittent asthma without complication 65/11/8467   ??? Peripheral neuropathy due to chemotherapy ([place]) 03/18/2019     No past surgical history on  file.  Current Outpatient Medications   Medication Sig   ??? Omeprazole delayed release (PRILOSEC D/R) 20 mg tablet Take 20 mg by mouth daily.   ??? lisinopriL (PRINIVIL, ZESTRIL) 10 mg tablet Take 1 Tablet by mouth daily.   ??? semaglutide (OZEMPIC) 0.25 mg or 0.5 mg/dose (2 mg/1.5 ml) subq pen Take 0.25 mg SC every 7 days x 4 weeks, then increase to 0.5 mg SC every 7 days.   ??? ondansetron (ZOFRAN ODT) 4 mg disintegrating tablet Take 1 Tablet by mouth every eight (8) hours as needed for Nausea.   ??? cyclobenzaprine (FLEXERIL) 10 mg tablet TAKE 1 TABLET BY MOUTH 3 TIMES DAILY AS NEEDED FOR MUSCLE SPASMS.   ??? pregabalin (LYRICA) 75 mg capsule TAKE 1 CAPSULE BY MOUTH TWO (2) TIMES A DAY. MAX DAILY AMOUNT: 150 MG. INDICATIONS: NEUROPATHIC PAIN   ??? atorvastatin (LIPITOR) 20 mg tablet TAKE 1 TABLET BY MOUTH EVERY DAY   ??? DULoxetine (CYMBALTA) 30 mg capsule Take 1 Capsule by mouth two (2) times a day.   ??? Flovent HFA 220 mcg/actuation inhaler TAKE 2 PUFFS BY INHALATION TWO (2) TIMES A DAY. INDICATIONS: CONTROLLER MEDICATION FOR ASTHMA   ??? potassium chloride SR (K-Tab) 20 mEq tablet Take 2 Tablets by mouth daily.   ??? insulin degludec Tyler Aas FlexTouch U-200) 200 unit/mL (3 mL) inpn pen 33 Units by SubCUTAneous route daily.   ??? insulin lispro (HumaLOG Junior KwikPen U-100) 100 unit/mL inph For fasting blood glucose: 180-200 2 U; 201-220 4 U; 221-240 5 U; 241-280 6 U; 281-320 7 U; 321-400 8 U; >400 8 U and call  Indications: sliding scale   ??? albuterol (PROVENTIL HFA, VENTOLIN HFA, PROAIR HFA) 90 mcg/actuation inhaler Take 1 Puff by inhalation every four (4) hours as needed for Wheezing or Cough.   ??? diphenhydrAMINE (BenadryL) 25 mg capsule Take 25 mg by mouth every six (6) hours as needed.   ??? ZINC PO Take  by mouth.   ??? thiamine HCl (VITAMIN B-1 PO) Take  by mouth.   ??? ascorbic acid (VITAMIN C PO) Take  by mouth.   ??? BIOTIN PO Take 1 Tab by mouth daily.   ??? calcium carb/vit D3/minerals (CALTRATE 600+D PLUS MINERALS PO) Take  by  mouth daily.   ??? OneTouch Ultra Blue Test Strip strip TEST 4 TIMES A DAY   ??? Nano Pen Needle 32 gauge x 5/32" ndle INJECT 4 TIMES A DAY WITH MEALS AND AT BEDTIME   ??? triamcinolone acetonide (KENALOG) 0.1 % topical cream APPLY TO AFFECTED AREA TWICE A DAY AS NEEDED FOR RASH   ??? levocetirizine dihydrochloride (XYZAL PO) Take  by mouth.   ??? albuterol (PROVENTIL VENTOLIN) 2.5 mg /3 mL (0.083 %) nebu 3 mL by Nebulization route every four (4) hours as needed (wheezing).   ??? magnesium oxide (MAG-OX) 400 mg tablet Take 800 mg by mouth two (2) times a day.   ??? prochlorperazine (COMPAZINE) 10 mg tablet Take 10 mg by mouth every four (4) hours as needed.   ??? nilotinib (TASIGNA) 200 mg cap capsule Take 200 mg by mouth two (2) times  a day.   ??? Magnesium Oxide-Mg AA Chelate (MG-PLUS) 133 mg tab Take 1 Tab by mouth two (2) times a day.   ??? cholecalciferol, VITAMIN D3, (VITAMIN D3) 5,000 unit tab tablet Take 5,000 Units by mouth daily.   ??? acyclovir (ZOVIRAX) 400 mg tablet Take 800 mg by mouth two (2) times a day.   ??? zolpidem (AMBIEN) 5 mg tablet Take 5 mg by mouth nightly.     No current facility-administered medications for this visit.     Allergies and Intolerances:   Allergies   Allergen Reactions   ??? Pcn [Penicillins] Hives and Itching     Family History: She has no family history of colon cancer of leukemia. Mother had breast cancer at the age of 50.   Family History   Problem Relation Age of Onset   ??? Breast Cancer Mother      Social History:   She  reports that she has never smoked. She has never used smokeless tobacco. She is divorced and has no children. She is a Optometrist, and working virtually due to the pandemic.     Social History     Substance and Sexual Activity   Alcohol Use No     Immunization History:  Immunization History   Administered Date(s) Administered   ??? COVID-19, Moderna, Primary or Immunocompromised Series, MRNA, PF, 181mg/0.5mL 06/16/2019, 07/14/2019   ??? COVID-19, PFIZER, MRNA, LNP-S, PF,  30MCG/0.3ML DOSE 03/12/2020   ??? Influenza Vaccine 02/27/2018, 02/23/2019, 03/01/2020   ??? Pneumococcal Polysaccharide (PPSV-23) 03/18/2019   ??? Tdap 03/18/2019       Review of Systems:   As above included in HPI.  Otherwise 11 point review of systems negative including constitutional, skin, HENT, eyes, respiratory, cardiovascular, gastrointestinal, genitourinary, musculoskeletal, endocrine, hematologic, allergy, and neurologic.    Physical:   Visit Vitals  BP 114/68 (BP 1 Location: Left arm, BP Patient Position: Sitting)   Pulse 89   Temp 97.7 ??F (36.5 ??C) (Temporal)   Resp 16   Ht 5' 8"  (1.727 m)   Wt 202 lb (91.6 kg)   SpO2 97%   BMI 30.71 kg/m??       Patient appears in no apparent distress. Affect is appropriate.    HEENT: PERRLA, anicteric, no JVD, adenopathy or thyromegaly. No carotid bruits or radiated murmur.  Lungs: clear to auscultation, no wheezes, rhonchi, or rales.  Heart: regular rate and rhythm. No murmur, rubs, gallops  Abdomen: soft, nontender, nondistended, normal bowel sounds, no hepatosplenomegaly or masses.   Extremities: without edema.            Review of Data:  Labs:  No visits with results within 2 Day(s) from this visit.   Latest known visit with results is:   Hospital Outpatient Visit on 05/04/2020   Component Date Value Ref Range Status   ??? WBC 05/04/2020 11.6  4.6 - 13.2 K/uL Final   ??? RBC 05/04/2020 4.44  4.20 - 5.30 M/uL Final   ??? HGB 05/04/2020 13.4  12.0 - 16.0 g/dL Final   ??? HCT 05/04/2020 42.4  35.0 - 45.0 % Final   ??? MCV 05/04/2020 95.5  78.0 - 100.0 FL Final   ??? MCH 05/04/2020 30.2  24.0 - 34.0 PG Final   ??? MCHC 05/04/2020 31.6  31.0 - 37.0 g/dL Final   ??? RDW 05/04/2020 13.5  11.6 - 14.5 % Final   ??? PLATELET 05/04/2020 247  135 - 420 K/uL Final   ??? MPV 05/04/2020 10.2  9.2 - 11.8 FL Final   ??? NRBC 05/04/2020 0.0  0 PER 100 WBC Final   ??? ABSOLUTE NRBC 05/04/2020 0.00  0.00 - 0.01 K/uL Final   ??? NEUTROPHILS 05/04/2020 68  40 - 73 % Final   ??? LYMPHOCYTES 05/04/2020 25  21 - 52 % Final    ??? MONOCYTES 05/04/2020 5  3 - 10 % Final   ??? EOSINOPHILS 05/04/2020 1  0 - 5 % Final   ??? BASOPHILS 05/04/2020 0  0 - 2 % Final   ??? IMMATURE GRANULOCYTES 05/04/2020 0  0.0 - 0.5 % Final   ??? ABS. NEUTROPHILS 05/04/2020 7.9  1.8 - 8.0 K/UL Final   ??? ABS. LYMPHOCYTES 05/04/2020 2.9  0.9 - 3.6 K/UL Final   ??? ABS. MONOCYTES 05/04/2020 0.6  0.05 - 1.2 K/UL Final   ??? ABS. EOSINOPHILS 05/04/2020 0.2  0.0 - 0.4 K/UL Final   ??? ABS. BASOPHILS 05/04/2020 0.0  0.0 - 0.1 K/UL Final   ??? ABS. IMM. GRANS. 05/04/2020 0.0  0.00 - 0.04 K/UL Final   ??? DF 05/04/2020 AUTOMATED    Final   ??? Hemoglobin A1c 05/04/2020 9.2* 4.2 - 5.6 % Final   ??? Est. average glucose 05/04/2020 217  mg/dL Final   ??? LIPID PROFILE 05/04/2020        Final   ??? Cholesterol, total 05/04/2020 163  <200 MG/DL Final   ??? Triglyceride 05/04/2020 168* <150 MG/DL Final   ??? HDL Cholesterol 05/04/2020 66* 40 - 60 MG/DL Final   ??? LDL, calculated 05/04/2020 63.4  0 - 100 MG/DL Final   ??? VLDL, calculated 05/04/2020 33.6  MG/DL Final   ??? CHOL/HDL Ratio 05/04/2020 2.5  0 - 5.0   Final   ??? Magnesium 05/04/2020 2.2  1.6 - 2.6 mg/dL Final   ??? Sodium 05/04/2020 142  136 - 145 mmol/L Final   ??? Potassium 05/04/2020 3.8  3.5 - 5.5 mmol/L Final   ??? Chloride 05/04/2020 105  100 - 111 mmol/L Final   ??? CO2 05/04/2020 30  21 - 32 mmol/L Final   ??? Anion gap 05/04/2020 7  3.0 - 18 mmol/L Final   ??? Glucose 05/04/2020 92  74 - 99 mg/dL Final   ??? BUN 05/04/2020 13  7.0 - 18 MG/DL Final   ??? Creatinine 05/04/2020 0.92  0.6 - 1.3 MG/DL Final   ??? BUN/Creatinine ratio 05/04/2020 14  12 - 20   Final   ??? GFR est AA 05/04/2020 >60  >60 ml/min/1.14m Final   ??? GFR est non-AA 05/04/2020 >60  >60 ml/min/1.755mFinal   ??? Calcium 05/04/2020 9.5  8.5 - 10.1 MG/DL Final   ??? Bilirubin, total 05/04/2020 0.7  0.2 - 1.0 MG/DL Final   ??? ALT (SGPT) 05/04/2020 34  13 - 56 U/L Final   ??? AST (SGOT) 05/04/2020 19  10 - 38 U/L Final   ??? Alk. phosphatase 05/04/2020 167* 45 - 117 U/L Final   ??? Protein, total 05/04/2020 7.4   6.4 - 8.2 g/dL Final   ??? Albumin 05/04/2020 3.9  3.4 - 5.0 g/dL Final   ??? Globulin 05/04/2020 3.5  2.0 - 4.0 g/dL Final   ??? A-G Ratio 05/04/2020 1.1  0.8 - 1.7   Final   ??? Microalbumin,urine random 05/04/2020 1.01  0 - 3.0 MG/DL Final   ??? Creatinine, urine 05/04/2020 95.00  30 - 125 mg/dL Final   ??? Microalbumin/Creat ratio (mg/g cre* 05/04/2020 11  0 - 30  mg/g Final   ??? Vitamin D 25-Hydroxy 05/04/2020 45.0  30 - 100 ng/mL Final   ??? GGT 05/04/2020 97* 5 - 55 U/L Final   ??? Alkaline phosphatase 05/04/2020 166* 44 - 121 IU/L Final   ??? Liver fraction 05/04/2020 71  18 - 85 % Final   ??? Bone fraction 05/04/2020 29  14 - 68 % Final   ??? Intestinal fraction 05/04/2020 0  0 - 18 % Final   ??? Michochondrial (M2) Ab 05/04/2020 <20.0  0.0 - 20.0 Units Final           Health Maintenance:  Screening:    Mammogram: Incomplete (02/03/2020) right ultrasound DUE.   PAP smear: Completed well woman exam with NP Bjorn Loser on 02/15/2020 Need report.   Colorectal: colonoscopy (05/2018) in [place]. Need report.   Depression: none   DM (HbA1c/FPG): HbA1c 9.2 (04/2020)   Hepatitis C: negative (03/2019)   Falls: none   DEXA: unknown   Glaucoma: Scheduled with Dr. Faustino Congress DUE   Smoking: none   Vitamin D: 45.0 (04/2020)   Medicare Wellness: 12/28/2019        Impression:  Patient Active Problem List   Diagnosis Code   ??? 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[place]) C91.01   ??? S/P bone marrow transplant ([place]) Z94.81   ??? Essential hypertension I10   ??? Type 2 diabetes mellitus, with long-term current use of insulin ([facility]) E11.9, Z79.4   ??? Peripheral neuropathy due to chemotherapy ([place]) G62.0, T45.1X5A   ??? Mild intermittent asthma without complication F64.33   ??? GERD (gastroesophageal reflux disease) K21.9   ??? Eczema L30.9   ??? Insomnia G47.00   ??? 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 Hyperlipidemia E78.5   ??? Abnormal mammogram of right breast R92.8   ??? Overweight (BMI 25.0-29.9) E66.3   ??? Elevated alkaline phosphatase level R74.8   ???  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 Acute lymphoblastic leukemia, +[place] chromosone mutation, s/p stem cell transplant. Diagnosed in 02/2017 and underwent induction chemotherapy followed by stem cell transplant in 08/2017. Now on maintenance therapy with a TKI (nilotinib). Being followed closely by Dr. Noemi Chapel, transplant oncologist, at [facility] in [place] with monthly blood draws and travel to [place] every 3 months for evaluation. Has also been followed locally by Dr. Edd Arbour in the past but not returned recently.  Last visit to [facility] in 04/2020.  CBC remains stable today.  2. Hypertension. Blood pressure has been well controlled on amlodipine 5 mg daily. Renal function remains normal with creatinine 0.92/ eGFR >60. On potassium and magnesium supplements due to long term difficulty with deficiency and stable levels today.  Given diabetes, discussed changing antihypertensive therapy to ACE inhibitor for renoprotective effect and patient agreeable.  Amlodipine discontinued and will begin lisinopril 10 mg daily.  Advised to monitor blood pressure at home to ensure continued control.  Will reassess at next visit.  3. Hyperlipidemia. Started treatment with moderate intensity dose atorvastatin (20 mg) in 03/2019 and lipid panel with LDL 63 and HDL 66 today, indicative of excellent control. Emphasized importance of lifestyle modifications, including diet, exercise, and weight loss. Continue to follow.  4. Diabetes mellitus. Previously well controlled on regimen of Tresiba 30 units daily and SS Humalog with HbA1c 6.5 in 03/2019. However, increased to HbA1c 7.1 in 06/2019 likely related to weight gain. Tresiba dose titrated to achieve fasting blood sugar of <150. Reports continuing to use  Tresiba 34 U nightly and Humalog 8 units 3 times daily/10 units nightly, but worsening HbA1c today to 9.2 likely related to 10 pound weight gain.  Will consult Pharm.D. Hobbs to assist with  obtaining continuous glucose monitor for patient given that patient is currently monitoring her blood sugar 4 times per day (before every meal and nightly) and requiring Humalog insulin SSI at these times.  Discussed and agreeable to begin GLP-1 agonist to help with blood sugar control as well as weight loss.  Will begin Ozempic and titrate to 0.5 mg weekly dose.  Will provide prescription for Zofran as patient reports difficulty with nausea in the past.  No evidence of microvascular complications. Started on statin and will begin ACE inhibitor today as discussed. Referred to Dr. Faustino Congress for eye exam and unclear if completed.  Will follow up at next visit to ascertain if completed.  Foot exam grossly normal (08/2019), and no evidence of microalbuminuria with urine microalbumin/ creatinine ratio 11 mg/g. Emphasized importance of lifestyle modifications, including diet, exercise, and weight loss.  Will reassess HbA1c in 3 months.  5. Peripheral neuropathy, painful. Thought to be secondary to chemotherapy. Did not tolerate gabapentin due to hallucinations. On Cymbalta 30 mg bid and Roxicodone through pain management.  However, weaned from [place] in 02/2020 and not wishing to restart.  Reported severe pain in feet at night with burning discomfort and cramping.  Started on pregabalin 75 mg twice daily and finding it to be very effective.  Using Flexeril as needed for cramping.  Continue to monitor.  6. Asthma, mild intermittent.  On Flovent for maintenance therapy and albuterol as needed. Reports stable symptoms today.  7. GERD. On Protonix 40 mg bid with reasonably good control. Follow.  8. Abnormal mammogram. Patient reports had previous biopsy of right breast which was benign. Screening mammogram (02/2019) showed multiple abnormalities in right breast. Right breast diagnostic mammogram and ultrasound (05/12/2019) showed probably benign findings, but recommendation was to proceed with short interval 6 month  follow-up (due 10/2019).  Underwent repeat diagnostic mammogram on 02/03/2020, but left prior to having ultrasound performed so not completed.  Provided with number to schedule today and urged to complete ultrasound.  Patient with FH history of breast cancer in her mother.  9. Left trigger thumb. Evaluated by Dr. Redmond Pulling in 04/2019 and received a cortisone injection with improvement.  Currently stable.  10. Elevated alkaline phosphatase. Unclear if related to transplant and bone marrow process.  Patient reports that has been elevated previously.  GGT elevated today at 97 so likely of hepatic origin. AMA negative (04/2020) and alkaline phosphatase isozymes with normal differential and predominant hepatic origin (04/2020). Agreeable to proceed with right upper quadrant ultrasound and order placed.  11. Insomnia. Patient using Ambien +/- Benadryl as needed. Will emphasize good sleep hygiene.  Continue to monitor.  12. Eczema. On Xyzal and will use Kenalog as needed.  Controlled today.  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  Completely resolved.  Advised use of meclizine as needed.  Will continue to monitor.  14.  Obesity. Weight had decreased 11 pounds in 12/2019, and patient reported that she had enrolled in the diabetes [employer] plan.  However, has gained 10 pounds since last visit and patient quite discouraged.  Will institute treatment with GLP-1 agonist to help with weight loss and diabetes control as discussed.  Emphasized importance of continuing with lifestyle modifications, including diet, exercise, and weight loss.  15. Health maintenance. Completed Moderna COVID 19 vaccine series and received a third  Moderna dose due to immunosuppressed status.  Will be due for COVID-19 booster dose in 08/2020. Given script for Shingrix vaccine but not yet obtained.  Already received the influenza vaccine.  Other immunizations up-to-date.  Mammogram as discussed.  Requested that patient provide a copy of her colonoscopy report to update  chart. Referred for well woman exam and reports now completed by NP Lowell Guitar.  Will request report.  Referred to Dr. Faustino Congress for eye exams and will follow up at next visit to see if completed in 01/2020 as scheduled.  Vitamin D level remains normal on maintenance dose supplement. Emphasized importance of lifestyle modifications, including diet, exercise, and weight loss. Initial Medicare wellness visit up-to-date.    Patient understands recommendations and agrees with plan.  Follow-up in 3 months.    This visit required high complexity medically necessary decision making and management plans.     Time spent in preparing for the visit, including review of history, tests done prior to arrival, additional time reviewing clinical data, imaging, outside records and test results:  5  minutes.  Time spent in counseling with patient and/or family members regarding care plan: 30 minutes.  Time spent in ordering tests, treatments, and referring patient for further care: 10 minutes.   Time spent on visit does not include time for documentation.

## 2020-05-11 NOTE — Telephone Encounter (Signed)
Patient currently using Tresiba 34 units nightly and Humalog sliding scale before every meal and nightly.  Will consult Pharm.D. Hobbs to determine if eligible for continuous glucose monitoring.

## 2020-05-11 NOTE — Progress Notes (Signed)
 1. Have you been to the ER, urgent care clinic since your last visit?  Hospitalized since your last visit? No    2. Have you seen or consulted any other health care providers outside of the [facility] since your last visit? No     3. For patients aged 59-75: Has the patient had a colonoscopy / FIT/ Cologuard? No     If the patient is female:    4. For patients aged 40-74: Has the patient had a mammogram within the past 2 years? Yes, HM satisfied with blue hyperlink    5. For patients aged 21-65: Has the patient had a pap smear? Yes Dr.Lee will request records

## 2020-05-15 NOTE — Telephone Encounter (Signed)
Have these records been received? If not, please place another request.     Thank you.

## 2020-05-16 NOTE — Telephone Encounter (Signed)
Have not been received, and fax machine is currently down, will request again when back up and running

## 2020-05-17 ENCOUNTER — Encounter

## 2020-05-17 MED ORDER — FREESTYLE LIBRE 2 READER
0 refills | Status: AC
Start: 2020-05-17 — End: ?

## 2020-05-17 MED ORDER — FREESTYLE LIBRE 2 SENSOR KIT
PACK | 5 refills | Status: AC
Start: 2020-05-17 — End: ?

## 2020-05-17 NOTE — Addendum Note (Signed)
Addendum Note by Dewitt Hoes, PHARMD at 05/17/20 1457                Author: Dewitt Hoes, PHARMD  Service: --  Author Type: Pharmacist       Filed: 05/17/20 1457  Encounter Date: 05/11/2020  Status: Signed          Editor: Dewitt Hoes, PHARMD (Pharmacist)          Addended by: Dewitt Hoes on: 05/17/2020 02:57 PM    Modules accepted: Orders

## 2020-05-17 NOTE — Telephone Encounter (Signed)
 Unable to reach patient to verify insurance. Looks like she may have Blue [employer] Blue Shield Medicaid, which would allow us  to send CGM order to the pharmacy and complete prior authorization online. She should be eligible as long as she is monitoring her blood sugars 3 times per day.     Sent in orders to local pharmacy and will complete prior authorization via CoverMyMeds if requested. If patient has Medicare, will need to submit order to DME.     Will try to reach her at another time and send follow up MyChart message to confirm insurance.         Thank you,  Alfonso CROME. Alen HIBBS, BCPS      For Pharmacy Admin Tracking Only    . CPA in place: Yes  . Recommendation Provided To: Patient/Caregiver: 1 via [employer]  . Intervention Detail: Patient Access Assistance/Sample Provided  . Gap Closed?: No  . Intervention Accepted By: Patient/Caregiver: 1  . Time Spent (min): 20

## 2020-05-18 NOTE — Telephone Encounter (Signed)
Have requested records again

## 2020-05-19 NOTE — Telephone Encounter (Signed)
Received annual exam report and forwarded to Dr Unknown Jim

## 2020-05-23 NOTE — Telephone Encounter (Signed)
 Spoke with patient. She will send us  her updated insurance information. It is a Medicare plan, so will need to send paperwork in to a DME company. Will get paperwork started once insurance information can be reviewed.    Thank you,  Alfonso CROME. Alen HIBBS, BCPS        For Pharmacy Admin Tracking Only    . CPA in place: Yes  . Recommendation Provided To: Patient/Caregiver: 0 via Telephone  . Time Spent (min): 10

## 2020-05-31 MED ORDER — CYCLOBENZAPRINE 10 MG TAB
10 mg | ORAL_TABLET | ORAL | 0 refills | Status: DC
Start: 2020-05-31 — End: 2020-06-29

## 2020-06-01 ENCOUNTER — Inpatient Hospital Stay: Payer: MEDICARE | Attending: Internal Medicine | Primary: Internal Medicine

## 2020-06-06 NOTE — Telephone Encounter (Signed)
Placed Freestyle Libre 2 CGM order form in [employer] for completion.      Thank you,  Albertine Grates. Corliss Marcus, BCPS          For Pharmacy Admin Tracking Only    . CPA in place: Yes  . Recommendation Provided To: Other: 1  . Intervention Detail: Patient Access Assistance/Sample Provided  . Gap Closed?: No  . Intervention Accepted By: Patient/Caregiver: 1  . Time Spent (min): 20

## 2020-06-15 ENCOUNTER — Inpatient Hospital Stay: Payer: MEDICARE | Attending: Internal Medicine | Primary: Internal Medicine

## 2020-06-15 ENCOUNTER — Encounter

## 2020-06-15 DIAGNOSIS — R928 Other abnormal and inconclusive findings on diagnostic imaging of breast: Secondary | ICD-10-CM

## 2020-06-19 NOTE — Telephone Encounter (Signed)
-----   Message from Candida McConnel sent at 06/19/2020 12:56 PM EST -----  Subject: Appointment Request    Reason for Call: Urgent Adult Urinary Problem    QUESTIONS  Type of Appointment? Established Patient  Reason for appointment request? Available appointments did not meet   patient need  Additional Information for Provider? Pt is having UTI symptoms. Increased   Frequency and Pressure. Was marked as Urgent and there was no   availability. Would like a call back to schedule an appointment  ---------------------------------------------------------------------------  --------------  CALL BACK INFO  What is the best way for the office to contact you? OK to leave message on   voicemail  Preferred Call Back Phone Number? [phone]  ---------------------------------------------------------------------------  --------------  SCRIPT ANSWERS  Relationship to Patient? Self  Have you recently (14 days) seen a provider for this problem? No  Have you been diagnosed with, awaiting test results for, or told that you   are suspected of having COVID-19 (Coronavirus)? (If patient has tested   negative or was tested as a requirement for work, school, or travel and   not based on symptoms, answer no)? No  Within the past two weeks have you developed any of the following symptoms   (answer "no" if symptoms have been present longer than 2 weeks or began   more than 2 weeks ago)? Fever or Chills, Cough, Shortness of breath or   difficulty breathing, Loss of taste or smell, Sore throat, Nasal   congestion, Sneezing or runny nose, Fatigue or generalized body aches   (answer no if pain is specific to a body part e.g. back pain), Diarrhea,   Headache? No  Have you had close contact with someone with COVID-19 in the last 14 days?   No  (Service Expert - click yes below to proceed with [employer] As Usual   Scheduling)? Yes

## 2020-06-19 NOTE — Telephone Encounter (Signed)
Patient reports urinary pressure and some burning when she urinates. Patient states it has been going on for a few weeks. Denies fever, body aches, chills, back pain. Patient will come by the office to complete POC UA/Culture. She said it will probably be tomorrow before she can come in.

## 2020-06-19 NOTE — Telephone Encounter (Signed)
Please call and get more details regarding symptoms.  See if would be appropriate for patient to drop off urine sample for POC urinalysis and culture.

## 2020-06-20 NOTE — Telephone Encounter (Signed)
 Pharmacy Progress Note - Telephone Call    [name] 60 y.o. was contacted via an outbound telephone call regarding Freestyle Libre CGM today.  A voicemail was left for patient to return my call.       Thank you,  Alfonso LITTIE. Alen HIBBS, BCPS        For Pharmacy Admin Tracking Only    . CPA in place: Yes  . Recommendation Provided To: Patient/Caregiver: 0 via [employer]  . Time Spent (min): 15

## 2020-06-22 ENCOUNTER — Encounter

## 2020-06-22 MED ORDER — PREGABALIN 75 MG CAP
75 mg | ORAL_CAPSULE | ORAL | 1 refills | Status: DC
Start: 2020-06-22 — End: 2020-08-28

## 2020-06-22 NOTE — Telephone Encounter (Signed)
sel

## 2020-06-22 NOTE — Telephone Encounter (Signed)
 [place] [facility] reports the last fill date for Lyrica  as 05/13/20 for a 30 d/s.     UDS done 12/28/19    Last Visit: 05/11/20 with MD Chet  Next Appointment: 08/15/20 with MD Sarris  Previous Refill Encounter(s): 03/28/20 #60 with 1 refill    Requested Prescriptions     Pending Prescriptions Disp Refills   . pregabalin  (LYRICA ) 75 mg capsule [Pharmacy Med Name: PREGABALIN  75 MG CAPSULE] 60 Capsule 1     Sig: TAKE 1 CAPSULE BY MOUTH TWICE A DAY MAX DAILY AMOUNT: 150 MG FOR NEUROPATHIC PAIN

## 2020-06-28 NOTE — Telephone Encounter (Signed)
 Pharmacy Progress Note - Telephone Call    [name] Wu Amsler 60 y.o. was contacted via an outbound telephone call regarding Freestyle Libre CGM order today.  A voicemail was left for patient to return my call.       Thank you,  Alfonso Wu. Alen HIBBS, BCPS        For Pharmacy Admin Tracking Only    . CPA in place: Yes  . Time Spent (min): 5

## 2020-06-29 MED ORDER — CYCLOBENZAPRINE 10 MG TAB
10 mg | ORAL_TABLET | ORAL | 1 refills | Status: DC
Start: 2020-06-29 — End: 2020-08-09

## 2020-07-21 NOTE — Telephone Encounter (Signed)
 Followed up with patient and [facility] regarding her CGM order for Freestyle Libre 2. There has been an issue with running the order through her [employer]. Patient double checked her benefits and stated that the order needed to be ran through her Part B plan. Notified [facility] and provided them with patient's Medicare Part B insurance information. Will follow up with patient regarding status once more information received.       Thank you,  Alfonso CROME. Alen HIBBS, BCPS            For Pharmacy Admin Tracking Only    . CPA in place: Yes  . Recommendation Provided To: Patient/Caregiver: 1 via Telephone and Other: 1  . Intervention Detail: Patient Access Assistance/Sample Provided  . Gap Closed?: No  . Intervention Accepted By: Patient/Caregiver: 1 and Other: 1  . Time Spent (min): 45

## 2020-08-08 ENCOUNTER — Ambulatory Visit: Payer: MEDICARE | Primary: Internal Medicine

## 2020-08-08 MED ORDER — POTASSIUM CHLORIDE ER 20 MEQ TABLET,EXTENDED RELEASE
20 mEq | ORAL_TABLET | Freq: Every day | ORAL | 1 refills | Status: DC
Start: 2020-08-08 — End: 2021-01-17

## 2020-08-08 NOTE — Telephone Encounter (Signed)
Patient states she is out of medication    Last Visit: 05/11/20 with MD Felipe Drone  Next Appointment: 08/15/20 with MD Sarris  Previous Refill Encounter(s): 10/07/19 #180 with 1 refill    Requested Prescriptions     Pending Prescriptions Disp Refills   . potassium chloride SR (K-Tab) 20 mEq tablet 180 Tablet 1     Sig: Take 2 Tablets by mouth daily.

## 2020-08-09 ENCOUNTER — Encounter: Payer: MEDICARE | Primary: Internal Medicine

## 2020-08-09 MED ORDER — CYCLOBENZAPRINE 10 MG TAB
10 mg | ORAL_TABLET | ORAL | 1 refills | Status: DC
Start: 2020-08-09 — End: 2020-09-14

## 2020-08-10 ENCOUNTER — Ambulatory Visit: Admit: 2020-08-10 | Discharge: 2020-08-10 | Payer: MEDICARE | Primary: Internal Medicine

## 2020-08-10 ENCOUNTER — Inpatient Hospital Stay: Admit: 2020-08-10 | Payer: MEDICARE | Primary: Internal Medicine

## 2020-08-10 DIAGNOSIS — C9101 Acute lymphoblastic leukemia, in remission: Secondary | ICD-10-CM

## 2020-08-10 LAB — METABOLIC PANEL, COMPREHENSIVE
A-G Ratio: 1.2 (ref 0.8–1.7)
ALT (SGPT): 25 U/L (ref 13–56)
AST (SGOT): 16 U/L (ref 10–38)
Albumin: 3.7 g/dL (ref 3.4–5.0)
Alk. phosphatase: 130 U/L — ABNORMAL HIGH (ref 45–117)
Anion gap: 8 mmol/L (ref 3.0–18)
BUN/Creatinine ratio: 10 — ABNORMAL LOW (ref 12–20)
BUN: 10 MG/DL (ref 7.0–18)
Bilirubin, total: 0.6 MG/DL (ref 0.2–1.0)
CO2: 30 mmol/L (ref 21–32)
Calcium: 9.3 MG/DL (ref 8.5–10.1)
Chloride: 105 mmol/L (ref 100–111)
Creatinine: 1 MG/DL (ref 0.6–1.3)
GFR est AA: >60 mL/min/{1.73_m2} (ref >60)
GFR est non-AA: 57 mL/min/{1.73_m2} — ABNORMAL LOW (ref >60)
Globulin: 3.2 g/dL (ref 2.0–4.0)
Glucose: 222 mg/dL — ABNORMAL HIGH (ref 74–99)
Potassium: 4 mmol/L (ref 3.5–5.5)
Protein, total: 6.9 g/dL (ref 6.4–8.2)
Sodium: 143 mmol/L (ref 136–145)

## 2020-08-10 LAB — LIPID PANEL
CHOL/HDL Ratio: 2.9 (ref 0–5.0)
Chol/HDL Ratio: 2.9 (ref 0–5.0)
Cholesterol, Total: 151 MG/DL (ref <200)
Cholesterol, total: 151 MG/DL (ref <200)
HDL Cholesterol: 52 MG/DL (ref 40–60)
HDL: 52 MG/DL (ref 40–60)
LDL Calculated: 63.8 MG/DL (ref 0–100)
LDL, calculated: 63.8 MG/DL (ref 0–100)
Triglyceride: 176 MG/DL — ABNORMAL HIGH (ref <150)
Triglycerides: 176 MG/DL — ABNORMAL HIGH (ref <150)
VLDL Cholesterol Calculated: 35.2 MG/DL
VLDL, calculated: 35.2 MG/DL

## 2020-08-10 LAB — HEMOGLOBIN A1C WITH EAG
Est. average glucose: 171 mg/dL
Hemoglobin A1c: 7.6 % — ABNORMAL HIGH (ref 4.2–5.6)

## 2020-08-10 LAB — MAGNESIUM
Magnesium: 2.2 mg/dL (ref 1.6–2.6)
Magnesium: 2.2 mg/dL (ref 1.6–2.6)

## 2020-08-10 LAB — TSH 3RD GENERATION
TSH: 1.2 u[IU]/mL (ref 0.36–3.74)
TSH: 1.2 u[IU]/mL (ref 0.36–3.74)

## 2020-08-10 LAB — VITAMIN B12
Vitamin B-12: >2000 pg/mL — ABNORMAL HIGH (ref 211–911)
Vitamin B12: >2000 pg/mL — ABNORMAL HIGH (ref 211–911)

## 2020-08-10 LAB — COMPREHENSIVE METABOLIC PANEL
ALT: 25 U/L (ref 13–56)
AST: 16 U/L (ref 10–38)
Albumin/Globulin Ratio: 1.2 (ref 0.8–1.7)
Albumin: 3.7 g/dL (ref 3.4–5.0)
Alkaline Phosphatase: 130 U/L — ABNORMAL HIGH (ref 45–117)
Anion Gap: 8 mmol/L (ref 3.0–18)
BUN/Creatinine Ratio: 10 — ABNORMAL LOW (ref 12–20)
BUN: 10 MG/DL (ref 7.0–18)
CO2: 30 mmol/L (ref 21–32)
Calcium: 9.3 MG/DL (ref 8.5–10.1)
Chloride: 105 mmol/L (ref 100–111)
Creatinine: 1 MG/DL (ref 0.6–1.3)
GFR African American: >60 mL/min/{1.73_m2} (ref >60)
Globulin: 3.2 g/dL (ref 2.0–4.0)
Glucose: 222 mg/dL — ABNORMAL HIGH (ref 74–99)
Potassium: 4 mmol/L (ref 3.5–5.5)
Sodium: 143 mmol/L (ref 136–145)
Total Bilirubin: 0.6 MG/DL (ref 0.2–1.0)
Total Protein: 6.9 g/dL (ref 6.4–8.2)
eGFR NON-AA: 57 mL/min/{1.73_m2} — ABNORMAL LOW (ref >60)

## 2020-08-10 LAB — HEMOGLOBIN A1C W/EAG
Estimated Avg Glucose: 171 mg/dL
Hemoglobin A1C: 7.6 % — ABNORMAL HIGH (ref 4.2–5.6)

## 2020-08-10 NOTE — Telephone Encounter (Signed)
Pt says to disregard. She is good

## 2020-08-10 NOTE — Telephone Encounter (Signed)
-----   Message from Kyung Bacca sent at 08/09/2020  5:34 PM EDT -----  Subject: Message to Provider    QUESTIONS  Information for Provider? Pt has lab appt. 08/10/20 at 3:30 and would like   something sooner if possible. Please call.  ---------------------------------------------------------------------------  --------------  CALL BACK INFO  What is the best way for the office to contact you? OK to leave message on   voicemail  Preferred Call Back Phone Number? [phone]  ---------------------------------------------------------------------------  --------------  SCRIPT ANSWERS  Relationship to Patient? Self

## 2020-08-11 LAB — CBC WITH AUTOMATED DIFF
ABS. BASOPHILS: 0 10*3/uL (ref 0.0–0.1)
ABS. EOSINOPHILS: 0.2 10*3/uL (ref 0.0–0.4)
ABS. IMM. GRANS.: 0 10*3/uL (ref 0.00–0.04)
ABS. LYMPHOCYTES: 2.5 10*3/uL (ref 0.9–3.6)
ABS. MONOCYTES: 0.4 10*3/uL (ref 0.05–1.2)
ABS. NEUTROPHILS: 6.9 10*3/uL (ref 1.8–8.0)
ABSOLUTE NRBC: 0 10*3/uL (ref 0.00–0.01)
BASOPHILS: 0 % (ref 0–2)
EOSINOPHILS: 2 % (ref 0–5)
HCT: 43.8 % (ref 35.0–45.0)
HGB: 13.9 g/dL (ref 12.0–16.0)
IMMATURE GRANULOCYTES: 0 % (ref 0.0–0.5)
LYMPHOCYTES: 25 % (ref 21–52)
MCH: 30.6 PG (ref 24.0–34.0)
MCHC: 31.7 g/dL (ref 31.0–37.0)
MCV: 96.5 FL (ref 78.0–100.0)
MONOCYTES: 4 % (ref 3–10)
MPV: 10.3 FL (ref 9.2–11.8)
NEUTROPHILS: 69 % (ref 40–73)
NRBC: 0 PER 100 WBC
PLATELET: 261 10*3/uL (ref 135–420)
RBC: 4.54 M/uL (ref 4.20–5.30)
RDW: 14.5 % (ref 11.6–14.5)
WBC: 10 10*3/uL (ref 4.6–13.2)

## 2020-08-11 LAB — VITAMIN D, 25 HYDROXY: Vitamin D 25-Hydroxy: 45.8 ng/mL (ref 30–100)

## 2020-08-11 LAB — CBC WITH AUTO DIFFERENTIAL
Basophils %: 0 % (ref 0–2)
Basophils Absolute: 0 10*3/uL (ref 0.0–0.1)
Eosinophils %: 2 % (ref 0–5)
Eosinophils Absolute: 0.2 10*3/uL (ref 0.0–0.4)
Granulocyte Absolute Count: 0 10*3/uL (ref 0.00–0.04)
Hematocrit: 43.8 % (ref 35.0–45.0)
Hemoglobin: 13.9 g/dL (ref 12.0–16.0)
Immature Granulocytes %: 0 % (ref 0.0–0.5)
Lymphocytes %: 25 % (ref 21–52)
Lymphocytes Absolute: 2.5 10*3/uL (ref 0.9–3.6)
MCH: 30.6 PG (ref 24.0–34.0)
MCHC: 31.7 g/dL (ref 31.0–37.0)
MCV: 96.5 FL (ref 78.0–100.0)
MPV: 10.3 FL (ref 9.2–11.8)
Monocytes %: 4 % (ref 3–10)
Monocytes Absolute: 0.4 10*3/uL (ref 0.05–1.2)
NRBC Absolute: 0 10*3/uL (ref 0.00–0.01)
Neutrophils %: 69 % (ref 40–73)
Neutrophils Absolute: 6.9 10*3/uL (ref 1.8–8.0)
Nucleated RBCs: 0 PER 100 WBC
Platelets: 261 10*3/uL (ref 135–420)
RBC: 4.54 M/uL (ref 4.20–5.30)
RDW: 14.5 % (ref 11.6–14.5)
WBC: 10 10*3/uL (ref 4.6–13.2)

## 2020-08-11 LAB — VITAMIN D 25 HYDROXY: Vit D, 25-Hydroxy: 45.8 ng/mL (ref 30–100)

## 2020-08-11 NOTE — Telephone Encounter (Signed)
 Spoke with patient and confirmed that she received her Freestyle Libre 2 CGM. 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 She states she does not need a session to learn how to use it or put the device on. Will sign off at this time unless further assistance requested from PCP or patient for continued DM management.           Thank you,  [name] Wu. [name] Wu, BCPS            For Pharmacy Admin Tracking Only    . CPA in place: Yes  . Recommendation Provided To: Patient/Caregiver: 1 via Telephone  . Intervention Detail: Patient Access Assistance/Sample Provided  . Gap Closed?: No  . Intervention Accepted By: Patient/Caregiver: 1  . Time Spent (min): 15

## 2020-08-15 ENCOUNTER — Ambulatory Visit: Attending: Internal Medicine | Primary: Internal Medicine

## 2020-08-15 ENCOUNTER — Ambulatory Visit: Admit: 2020-08-15 | Discharge: 2020-08-15 | Payer: MEDICARE | Attending: Internal Medicine | Primary: Internal Medicine

## 2020-08-15 DIAGNOSIS — R928 Other abnormal and inconclusive findings on diagnostic imaging of breast: Secondary | ICD-10-CM

## 2020-08-15 DIAGNOSIS — E1140 Type 2 diabetes mellitus with diabetic neuropathy, unspecified: Secondary | ICD-10-CM

## 2020-08-15 MED ORDER — BD ULTRA-FINE NANO PEN NEEDLE 32 GAUGE X 5/32"
32 gauge x 5/" | PEN_INJECTOR | 3 refills | Status: AC
Start: 2020-08-15 — End: ?

## 2020-08-15 MED ORDER — OZEMPIC 1 MG/DOSE (4 MG/3 ML) SUBCUTANEOUS PEN INJECTOR
1 mg/dose (4 mg/3 mL) | SUBCUTANEOUS | 2 refills | Status: DC
Start: 2020-08-15 — End: 2021-01-23

## 2020-08-15 NOTE — Progress Notes (Signed)
1. "Have you been to the ER, urgent care clinic since your last visit?  Hospitalized since your last visit?" No    2. "Have you seen or consulted any other health care providers outside of the [facility] since your last visit?" No     3. For patients aged 60-75: Has the patient had a colonoscopy / FIT/ Cologuard? Yes - Care Gap present. Rooming MA/LPN to request most recent results      If the patient is female:    4. For patients aged 85-74: Has the patient had a mammogram within the past 2 years? Yes - no Care Gap present      5. For patients aged 21-65: Has the patient had a pap smear? Yes - Care Gap present. Rooming MA/LPN to request most recent results

## 2020-08-15 NOTE — Progress Notes (Signed)
HPI:   [name] Wu is a 60 y.o. year old female who presents today for a routine visit. She has a history of acute lymphoblastic leukemia, s/p stem cell transplant, hypertension, hyperlipidemia, diabetes mellitus, peripheral neuropathy, asthma, GERD, insomnia, and osteoarthritis. She is continuing to follow with the [facility] in [place] and travels there every 3 months for exam.  She has completed the [place] COVID-19 vaccine series and received a third Moderna dose given immunosuppressed status.  She reports that she is doing reasonably well.  She reports continued good control of her bilateral foot pain since initiating pregabalin in addition to duloxetine and Flexeril.  She states that she is no longer requiring treatment with narcotics.  She reports that she now has acquired the freestyle continuous glucose monitor and is starting to use.  She has tolerated initiation of Ozempic without difficulty although has not yet noted any decrease in her appetite.  She is otherwise without new complaints and feeling generally well.       Summary of prior hospitalizations and medical history:   She has a history of acute lymphoblastic leukemia, diagnosed in 02/2017 after experiencing severe fatigue and bone pain for several months. She was positive for the [place] chromosome mutation. She presented to the [facility] in [place] for care, and underwent chemotherapy which achieved remission. She subsequently underwent a stem cell transplant on 08/14/2017, felt to be curative. She is now on maintenance therapy with a tyrosine kinase inhibitor, nilotimib 200 mg bid. She is followed by Dr. Noemi Chapel , transplant oncologist, and travels to [facility] every three months for care as discussed. She is also followed locally by Dr. Edd Arbour.     He has a history of hypertension, treated with amlodipine. She has a history of hyperlipidemia, and was restarted on atorvastatin in 07/2019.   She also has a history of diabetes mellitus, currently being treated with [place] and Humalog. She denies any polyuria, polydipsia, nocturia, or blurry vision, and has no history of retinopathy or nephropathy. 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 She is currently under the care of pain management and is being treated with Cymbalta and Roxicodone as needed.     She has a history of asthma since childhood, and is being maintained on Flovent and albuterol as needed. She denies any cough or shortness of breath.     In 04/2018, she was admitted to [facility] from 12/4-12/10/2017 for a UTI and then subsequently readmitted from 12/6-12/17/2019 with MRSE bacteremia related to a PICC line and bibasilar pneumonia.     He has a history of GERD, treated with Protonix. She also underwent a screening colonoscopy in 05/2018 in [place]. Report unavailable. She denies any abdominal pain, nausea, vomiting, melena, hematochezia, or change in bowel movements.      Past Medical History:   Diagnosis Date   ??? ALL (acute lymphoblastic leukemia) ([place]) 02/2017    s/p stem cell transplant 08/2017; [facility] in [place]   ??? Diabetes ([place])    ??? GERD (gastroesophageal reflux disease) 03/18/2019   ??? 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 H/O stem cell transplant ([place]) 041/04/19   ??? Hyperlipidemia 03/22/2019   ??? Hypertension    ??? Insomnia 03/22/2019   ??? Mild intermittent asthma without complication 78/06/9560   ??? Peripheral neuropathy due to chemotherapy ([place]) 03/18/2019     No  past surgical history on file.  Current Outpatient Medications   Medication Sig   ??? Nano Pen Needle 32 gauge x 5/32" ndle INJECT 4 TIMES A DAY WITH MEALS AND AT BEDTIME   ??? semaglutide (Ozempic) 1 mg/dose (4 mg/3 mL) pnij 1 mg by SubCUTAneous route every seven (7) days. Indications: type 2 diabetes mellitus   ??? cyclobenzaprine (FLEXERIL) 10 mg tablet TAKE 1 TABLET BY MOUTH  THREE TIMES A DAY AS NEEDED FOR MUSCLE SPASMS   ??? potassium chloride SR (K-Tab) 20 mEq tablet Take 2 Tablets by mouth daily.   ??? pregabalin (LYRICA) 75 mg capsule TAKE 1 CAPSULE BY MOUTH TWICE A DAY MAX DAILY AMOUNT: 150 MG FOR NEUROPATHIC PAIN   ??? flash glucose sensor (FreeStyle Libre 2 Sensor) kit Use as directed to check blood sugars at least 3 times daily.   ??? flash glucose scanning reader (FreeStyle Libre 2 Reader) misc Use as directed to check blood sugars at least 3 times daily.   ??? Omeprazole delayed release (PRILOSEC D/R) 20 mg tablet Take 20 mg by mouth daily.   ??? lisinopriL (PRINIVIL, ZESTRIL) 10 mg tablet Take 1 Tablet by mouth daily.   ??? ondansetron (ZOFRAN ODT) 4 mg disintegrating tablet Take 1 Tablet by mouth every eight (8) hours as needed for Nausea.   ??? atorvastatin (LIPITOR) 20 mg tablet TAKE 1 TABLET BY MOUTH EVERY DAY   ??? DULoxetine (CYMBALTA) 30 mg capsule Take 1 Capsule by mouth two (2) times a day.   ??? Flovent HFA 220 mcg/actuation inhaler TAKE 2 PUFFS BY INHALATION TWO (2) TIMES A DAY. INDICATIONS: CONTROLLER MEDICATION FOR ASTHMA   ??? insulin degludec Tyler Aas FlexTouch U-200) 200 unit/mL (3 mL) inpn pen 33 Units by SubCUTAneous route daily.   ??? insulin lispro (HumaLOG Junior KwikPen U-100) 100 unit/mL inph For fasting blood glucose: 180-200 2 U; 201-220 4 U; 221-240 5 U; 241-280 6 U; 281-320 7 U; 321-400 8 U; >400 8 U and call  Indications: sliding scale   ??? albuterol (PROVENTIL HFA, VENTOLIN HFA, PROAIR HFA) 90 mcg/actuation inhaler Take 1 Puff by inhalation every four (4) hours as needed for Wheezing or Cough.   ??? diphenhydrAMINE (BenadryL) 25 mg capsule Take 25 mg by mouth every six (6) hours as needed.   ??? ZINC PO Take  by mouth.   ??? thiamine HCl (VITAMIN B-1 PO) Take  by mouth.   ??? ascorbic acid (VITAMIN C PO) Take  by mouth.   ??? BIOTIN PO Take 1 Tab by mouth daily.   ??? calcium carb/vit D3/minerals (CALTRATE 600+D PLUS MINERALS PO) Take  by mouth daily.   ??? OneTouch Ultra Blue Test  Strip strip TEST 4 TIMES A DAY   ??? triamcinolone acetonide (KENALOG) 0.1 % topical cream APPLY TO AFFECTED AREA TWICE A DAY AS NEEDED FOR RASH   ??? levocetirizine dihydrochloride (XYZAL PO) Take  by mouth.   ??? albuterol (PROVENTIL VENTOLIN) 2.5 mg /3 mL (0.083 %) nebu 3 mL by Nebulization route every four (4) hours as needed (wheezing).   ??? magnesium oxide (MAG-OX) 400 mg tablet Take 800 mg by mouth two (2) times a day.   ??? prochlorperazine (COMPAZINE) 10 mg tablet Take 10 mg by mouth every four (4) hours as needed.   ??? nilotinib (TASIGNA) 200 mg cap capsule Take 200 mg by mouth two (2) times a day.   ??? Magnesium Oxide-Mg AA Chelate (MG-PLUS) 133 mg tab Take 1 Tab by mouth two (2) times a day.   ???  cholecalciferol, VITAMIN D3, (VITAMIN D3) 5,000 unit tab tablet Take 5,000 Units by mouth daily.   ??? acyclovir (ZOVIRAX) 400 mg tablet Take 800 mg by mouth two (2) times a day.   ??? zolpidem (AMBIEN) 5 mg tablet Take 5 mg by mouth nightly.     No current facility-administered medications for this visit.     Allergies and Intolerances:   Allergies   Allergen Reactions   ??? Pcn [Penicillins] Hives and Itching     Family History: She has no family history of colon cancer of leukemia. Mother had breast cancer at the age of 41.   Family History   Problem Relation Age of Onset   ??? Breast Cancer Mother      Social History:   She  reports that she has never smoked. She has never used smokeless tobacco. She is divorced and has no children. She is a Optometrist, and working virtually due to the pandemic.     Social History     Substance and Sexual Activity   Alcohol Use No     Immunization History:  Immunization History   Administered Date(s) Administered   ??? COVID-19, Moderna, Primary or Immunocompromised Series, MRNA, PF, 132mg/0.5mL 06/16/2019, 07/14/2019   ??? COVID-19, Pfizer Purple top, DILUTE for use, 12+ yrs, 329m/0.3mL dose 03/12/2020   ??? Influenza Vaccine 02/27/2018, 02/23/2019, 03/01/2020   ??? Pneumococcal Polysaccharide  (PPSV-23) 03/18/2019   ??? Tdap 03/18/2019       Review of Systems:   As above included in HPI.  Otherwise 11 point review of systems negative including constitutional, skin, HENT, eyes, respiratory, cardiovascular, gastrointestinal, genitourinary, musculoskeletal, endocrine, hematologic, allergy, and neurologic.    Physical:   Visit Vitals  BP 122/80   Pulse 65   Temp 97.3 ??F (36.3 ??C) (Temporal)   Resp 16   Ht '5\' 8"'$  (1.727 m)   Wt 209 lb (94.8 kg)   SpO2 96%   BMI 31.78 kg/m??       Patient appears in no apparent distress. Affect is appropriate.    HEENT: PERRLA, anicteric, no JVD, adenopathy or thyromegaly. No carotid bruits or radiated murmur.  Lungs: clear to auscultation, no wheezes, rhonchi, or rales.  Heart: regular rate and rhythm. No murmur, rubs, gallops  Abdomen: soft, nontender, nondistended, normal bowel sounds, no hepatosplenomegaly or masses.   Extremities: without edema.            Review of Data:  Labs:  No visits with results within 2 Day(s) from this visit.   Latest known visit with results is:   Hospital Outpatient Visit on 08/10/2020   Component Date Value Ref Range Status   ??? WBC 08/10/2020 10.0  4.6 - 13.2 K/uL Final   ??? RBC 08/10/2020 4.54  4.20 - 5.30 M/uL Final   ??? HGB 08/10/2020 13.9  12.0 - 16.0 g/dL Final   ??? HCT 08/10/2020 43.8  35.0 - 45.0 % Final   ??? MCV 08/10/2020 96.5  78.0 - 100.0 FL Final   ??? MCH 08/10/2020 30.6  24.0 - 34.0 PG Final   ??? MCHC 08/10/2020 31.7  31.0 - 37.0 g/dL Final   ??? RDW 08/10/2020 14.5  11.6 - 14.5 % Final   ??? PLATELET 08/10/2020 261  135 - 420 K/uL Final   ??? MPV 08/10/2020 10.3  9.2 - 11.8 FL Final   ??? NRBC 08/10/2020 0.0  0 PER 100 WBC Final   ??? ABSOLUTE NRBC 08/10/2020 0.00  0.00 - 0.01 K/uL Final   ???  NEUTROPHILS 08/10/2020 69  40 - 73 % Final   ??? LYMPHOCYTES 08/10/2020 25  21 - 52 % Final   ??? MONOCYTES 08/10/2020 4  3 - 10 % Final   ??? EOSINOPHILS 08/10/2020 2  0 - 5 % Final   ??? BASOPHILS 08/10/2020 0  0 - 2 % Final   ??? IMMATURE GRANULOCYTES 08/10/2020 0  0.0  - 0.5 % Final   ??? ABS. NEUTROPHILS 08/10/2020 6.9  1.8 - 8.0 K/UL Final   ??? ABS. LYMPHOCYTES 08/10/2020 2.5  0.9 - 3.6 K/UL Final   ??? ABS. MONOCYTES 08/10/2020 0.4  0.05 - 1.2 K/UL Final   ??? ABS. EOSINOPHILS 08/10/2020 0.2  0.0 - 0.4 K/UL Final   ??? ABS. BASOPHILS 08/10/2020 0.0  0.0 - 0.1 K/UL Final   ??? ABS. IMM. GRANS. 08/10/2020 0.0  0.00 - 0.04 K/UL Final   ??? DF 08/10/2020 AUTOMATED    Final   ??? Hemoglobin A1c 08/10/2020 7.6* 4.2 - 5.6 % Final   ??? Est. average glucose 08/10/2020 171  mg/dL Final   ??? LIPID PROFILE 08/10/2020        Final   ??? Cholesterol, total 08/10/2020 151  <200 MG/DL Final   ??? Triglyceride 08/10/2020 176* <150 MG/DL Final   ??? HDL Cholesterol 08/10/2020 52  40 - 60 MG/DL Final   ??? LDL, calculated 08/10/2020 63.8  0 - 100 MG/DL Final   ??? VLDL, calculated 08/10/2020 35.2  MG/DL Final   ??? CHOL/HDL Ratio 08/10/2020 2.9  0 - 5.0   Final   ??? Magnesium 08/10/2020 2.2  1.6 - 2.6 mg/dL Final   ??? Sodium 08/10/2020 143  136 - 145 mmol/L Final   ??? Potassium 08/10/2020 4.0  3.5 - 5.5 mmol/L Final   ??? Chloride 08/10/2020 105  100 - 111 mmol/L Final   ??? CO2 08/10/2020 30  21 - 32 mmol/L Final   ??? Anion gap 08/10/2020 8  3.0 - 18 mmol/L Final   ??? Glucose 08/10/2020 222* 74 - 99 mg/dL Final   ??? BUN 08/10/2020 10  7.0 - 18 MG/DL Final   ??? Creatinine 08/10/2020 1.00  0.6 - 1.3 MG/DL Final   ??? BUN/Creatinine ratio 08/10/2020 10* 12 - 20   Final   ??? GFR est AA 08/10/2020 >60  >60 ml/min/1.33m Final   ??? GFR est non-AA 08/10/2020 57* >60 ml/min/1.785mFinal   ??? Calcium 08/10/2020 9.3  8.5 - 10.1 MG/DL Final   ??? Bilirubin, total 08/10/2020 0.6  0.2 - 1.0 MG/DL Final   ??? ALT (SGPT) 08/10/2020 25  13 - 56 U/L Final   ??? AST (SGOT) 08/10/2020 16  10 - 38 U/L Final   ??? Alk. phosphatase 08/10/2020 130* 45 - 117 U/L Final   ??? Protein, total 08/10/2020 6.9  6.4 - 8.2 g/dL Final   ??? Albumin 08/10/2020 3.7  3.4 - 5.0 g/dL Final   ??? Globulin 08/10/2020 3.2  2.0 - 4.0 g/dL Final   ??? A-G Ratio 08/10/2020 1.2  0.8 - 1.7   Final   ???  Vitamin D 25-Hydroxy 08/10/2020 45.8  30 - 100 ng/mL Final   ??? TSH 08/10/2020 1.20  0.36 - 3.74 uIU/mL Final   ??? Vitamin B12 08/10/2020 >2,000* 211 - 911 pg/mL Final           Health Maintenance:  Screening:    Mammogram: Incomplete (02/03/2020) right ultrasound DUE.   PAP smear: Completed well woman exam with NP MeBjorn Losern 02/15/2020  Need report.   Colorectal: colonoscopy (05/2018) in [place]. Need report.   Depression: none   DM (HbA1c/FPG): HbA1c 7.6 (07/2020)   Hepatitis C: negative (03/2019)   Falls: none   DEXA: unknown   Glaucoma: Referred to Dr. Faustino Congress DUE   Smoking: none   Vitamin D: 45.8 (07/2020)   Medicare Wellness: 12/28/2019        Impression:  Patient Active Problem List   Diagnosis Code   ??? 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[place]) C91.01   ??? S/P bone marrow transplant ([place]) Z94.81   ??? Essential hypertension I10   ??? Type 2 diabetes mellitus, with long-term current use of insulin ([facility]) E11.9, Z79.4   ??? Peripheral neuropathy due to chemotherapy ([place]) G62.0, T45.1X5A   ??? Mild intermittent asthma without complication U98.11   ??? GERD (gastroesophageal reflux disease) K21.9   ??? Eczema L30.9   ??? Insomnia G47.00   ??? 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   ??? Hyperlipidemia E78.5   ??? Abnormal mammogram of right breast R92.8   ??? Elevated alkaline phosphatase level R74.8   ??? 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 Class 1 obesity due to excess calories with serious comorbidity and body mass index (BMI) of 30.0 to 30.9 in adult E66.09, Z68.30       Plan:  1. Acute lymphoblastic leukemia, +[place] chromosone mutation, s/p stem cell transplant. Diagnosed in 02/2017 and underwent induction chemotherapy followed by stem cell transplant in 08/2017. Now on maintenance therapy with a TKI (nilotinib). Being followed closely by Dr. Noemi Chapel, transplant oncologist, at [facility] in [place] with monthly blood draws and travel to [place] every 3 months for evaluation. Has also  been followed locally by Dr. Edd Arbour in the past but not returned recently.  Next scheduled to visit [place] in 10/2020.  CBC remains stable today.  2. Hypertension. Blood pressure had been well controlled on amlodipine 5 mg daily, but changed to lisinopril 10 mg daily in 04/2020 with continued good control of blood pressure. Renal function remains normal with creatinine 1.00/ eGFR >60. On potassium and magnesium supplements due to long term difficulty with deficiency and stable levels today. Advised to monitor blood pressure at home to ensure continued control.  Will reassess at next visit.  3. Hyperlipidemia.  On moderate intensity dose atorvastatin with LDL 63 and HDL 52 today, indicative of excellent control. Emphasized importance of lifestyle modifications, including diet, exercise, and weight loss. Continue to follow.  4. Diabetes mellitus. Previously well controlled on regimen of Tresiba and SS Humalog with HbA1c 6.5 in 03/2019. However, increased to HbA1c 7.1 in 06/2019 and 9.2 in 04/2020 likely related to weight gain.  Now prescribed freestyle continuous glucose monitor to help with close monitoring of blood sugars.  Started on Ozempic in 04/2020 and continues on Tresiba at 34 units daily, sliding scale Humalog.  Repeat HbA1c today improved to 7.6.  Will increase dose of Ozempic to 1 mg weekly.  No evidence of microvascular complications. On statin and started on lisinopril in 04/2020.  Referred to Dr. Faustino Congress for eye exam and did not complete.  Provided with phone number to schedule.  Foot exam grossly normal (08/2019), and no evidence of microalbuminuria with urine microalbumin/ creatinine ratio 11 mg/g (04/2020). Emphasized importance of lifestyle modifications, including diet, exercise, and weight loss.  Will reassess HbA1c in 3 months.  5. Peripheral neuropathy, painful. Thought to be secondary to chemotherapy. Did not tolerate gabapentin due to hallucinations. On Cymbalta 30  mg bid and Roxicodone through  pain management.  However, weaned from [place] in 02/2020 and not wishing to restart.  Reported severe pain in feet at night with burning discomfort and cramping.  Started on pregabalin 75 mg twice daily and finding it to be very effective.  Using Flexeril as needed for cramping.  No longer requiring any narcotics.  Continue to monitor.  6. Asthma, mild intermittent.  On Flovent for maintenance therapy and albuterol as needed. Reports stable symptoms today.  7. GERD. On Protonix 40 mg bid with reasonably good control. Follow.  8. Abnormal mammogram. Patient reports had previous biopsy of right breast which was benign. Screening mammogram (02/2019) showed multiple abnormalities in right breast. Right breast diagnostic mammogram and ultrasound (05/12/2019) showed probably benign findings, but recommendation was to proceed with short interval 6 month follow-up (due 10/2019).  Underwent repeat diagnostic mammogram on 02/03/2020, but left prior to having ultrasound performed so not completed.  Again provided with number to schedule today and urged to complete ultrasound.  Patient with FH history of breast cancer in her mother.  9. Left trigger thumb. Evaluated by Dr. Redmond Pulling in 04/2019 and received a cortisone injection with improvement.  Currently stable.  10. Elevated alkaline phosphatase. Unclear if related to transplant and bone marrow process.  Patient reports that has been elevated previously.  GGT elevated today at 97 so likely of hepatic origin. AMA negative (04/2020) and alkaline phosphatase isozymes with normal differential and predominant hepatic origin (04/2020). Agreeable to proceed with right upper quadrant ultrasound at last visit but did not schedule.  Repeat alkaline phosphatase improved today to 130.  Will readdress at next visit.  11. Insomnia. Patient using Ambien +/- Benadryl as needed. Will emphasize good sleep hygiene.  Continue to monitor.  12. Eczema. On Xyzal and will use Kenalog as needed.   Controlled today.  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  Completely resolved.  Advised use of meclizine as needed.  Will continue to monitor.  14.  Obesity. Weight had decreased 11 pounds in 12/2019, and patient reported that she had enrolled in the diabetes [employer] plan.  However, has gained 17 pounds since 12/2019.  Started treatment with Ozempic to help with weight loss and diabetes control as discussed, but weight actually increased today.  Will increase dose as discussed.  Emphasized importance of continuing with lifestyle modifications, including heart healthy diet, regular exercise, and weight loss.  15. Health maintenance. Completed Moderna COVID 19 vaccine series and received a third Moderna dose due to immunosuppressed status.  Advised to proceed with Moderna booster dose given eligibility. Given script for Shingrix vaccine but not yet obtained. Other immunizations up-to-date. Mammogram as discussed.  Requested that patient provide a copy of her colonoscopy report to update chart. Referred for well woman exam and reports now completed by NP Rojelio Brenner. Will request report.  Referred to Dr. Faustino Congress for eye exams and provided with number to schedule as reports had to cancel appointment.  Vitamin D level remains normal on maintenance dose supplement. Emphasized importance of lifestyle modifications, including diet, exercise, and weight loss. Initial Medicare wellness visit up-to-date.    Patient understands recommendations and agrees with plan.  Follow-up in 3 months.

## 2020-08-20 NOTE — Telephone Encounter (Signed)
Please request results of Pap smear and office note from [facility] women's wellness.  Patient reports that completed within the last 6 months.    Thanks.

## 2020-08-21 NOTE — Telephone Encounter (Signed)
Records have been requested

## 2020-08-26 ENCOUNTER — Encounter

## 2020-08-28 NOTE — Telephone Encounter (Signed)
[place] last fill date for Lyrica as 07/21/20 for a 30 d/s.     UDS done 12/28/19    Last Visit: 08/15/20 with MD Felipe Drone  Next Appointment: 11/29/20 with MD Sarris  Previous Refill Encounter(s): 06/22/20 #60 with 1 refill    Requested Prescriptions     Pending Prescriptions Disp Refills   . pregabalin (LYRICA) 75 mg capsule [Pharmacy Med Name: PREGABALIN 75 MG CAPSULE] 60 Capsule 2     Sig: TAKE 1 CAPSULE BY MOUTH TWICE A DAY MAX DAILY AMOUNT: 150 MG FOR NEUROPATHIC PAIN

## 2020-08-29 MED ORDER — PREGABALIN 75 MG CAP
75 mg | ORAL_CAPSULE | ORAL | 1 refills | Status: DC
Start: 2020-08-29 — End: 2020-10-25

## 2020-09-14 MED ORDER — CYCLOBENZAPRINE 10 MG TAB
10 mg | ORAL_TABLET | ORAL | 1 refills | Status: DC
Start: 2020-09-14 — End: 2020-11-19

## 2020-09-15 ENCOUNTER — Inpatient Hospital Stay: Payer: MEDICARE | Attending: Internal Medicine | Primary: Internal Medicine

## 2020-09-15 ENCOUNTER — Ambulatory Visit: Payer: MEDICARE | Primary: Internal Medicine

## 2020-09-27 MED ORDER — TRESIBA FLEXTOUCH U-200 INSULIN 200 UNIT/ML (3 ML) SUBCUTANEOUS PEN
200 unit/mL (3 mL) | SUBCUTANEOUS | 3 refills | Status: AC
Start: 2020-09-27 — End: ?

## 2020-10-09 MED ORDER — INSULIN LISPRO (U-100) 100 UNIT/ML SUBCUTANEOUS HALF-UNIT PEN
100 unit/mL | SUBCUTANEOUS | 3 refills | Status: DC
Start: 2020-10-09 — End: 2020-10-18

## 2020-10-18 MED ORDER — INSULIN LISPRO (U-100) 100 UNIT/ML SUBCUTANEOUS HALF-UNIT PEN
100 unit/mL | SUBCUTANEOUS | 3 refills | Status: AC
Start: 2020-10-18 — End: ?

## 2020-10-18 NOTE — Telephone Encounter (Signed)
error 

## 2020-10-18 NOTE — Telephone Encounter (Signed)
Updated frequency to include how often patient is checking BS. Spoke with patient she reports checking 4 x day. Please resend updated RX to pharmacy.

## 2020-10-24 NOTE — Telephone Encounter (Signed)
Called and provided additional information to pharmacy. Patient is aware the new label will not have sliding scale as it wont fit on the label. They are going to write max daily amount of 32 units. Patient has previous documentation of sliding scale at home and does not need [place] to send her the information. No further questions or concerns at this time.

## 2020-10-24 NOTE — Telephone Encounter (Signed)
[facility] calling, needs to max dosage per day on insulin    Ref number [id number]

## 2020-10-25 ENCOUNTER — Encounter

## 2020-10-25 MED ORDER — PREGABALIN 75 MG CAP
75 mg | ORAL_CAPSULE | ORAL | 1 refills | Status: DC
Start: 2020-10-25 — End: 2021-01-01

## 2020-10-25 NOTE — Telephone Encounter (Signed)
 [place] last fill date for Lyrica  as 09/25/20 for a 30 d/s.     Last Visit: 08/15/20 with MD Chet  Next Appointment: 11/29/20 with MD Sarris  Previous Refill Encounter(s): 08/28/20 #60 with 1 refill    Requested Prescriptions     Pending Prescriptions Disp Refills   . pregabalin  (LYRICA ) 75 mg capsule [Pharmacy Med Name: PREGABALIN  75 MG CAPSULE] 60 Capsule 1     Sig: TAKE 1 CAPSULE BY MOUTH TWICE A DAY MAX DAILY AMOUNT: 150 MG FOR NEUROPATHIC PAIN         For Pharmacy Admin Tracking Only    . CPA in place:   . Recommendation Provided To:   SABRA Intervention Detail: New Rx: 1, reason: Patient Preference  . Gap Closed?:   . Intervention Accepted By:   . Time Spent (min): 5

## 2020-10-27 ENCOUNTER — Inpatient Hospital Stay: Admit: 2020-10-27 | Payer: MEDICARE | Attending: Internal Medicine | Primary: Internal Medicine

## 2020-10-27 DIAGNOSIS — R928 Other abnormal and inconclusive findings on diagnostic imaging of breast: Secondary | ICD-10-CM

## 2020-11-19 MED ORDER — CYCLOBENZAPRINE 10 MG TAB
10 mg | ORAL_TABLET | ORAL | 1 refills | Status: DC
Start: 2020-11-19 — End: 2020-12-26

## 2020-11-22 ENCOUNTER — Ambulatory Visit: Admit: 2020-11-22 | Discharge: 2020-11-22 | Payer: MEDICARE | Primary: Internal Medicine

## 2020-11-22 ENCOUNTER — Inpatient Hospital Stay: Admit: 2020-11-22 | Payer: MEDICARE | Primary: Internal Medicine

## 2020-11-22 DIAGNOSIS — C9101 Acute lymphoblastic leukemia, in remission: Secondary | ICD-10-CM

## 2020-11-23 LAB — METABOLIC PANEL, COMPREHENSIVE
A-G Ratio: 1.2 (ref 0.8–1.7)
ALT (SGPT): 25 U/L (ref 13–56)
AST (SGOT): 24 U/L (ref 10–38)
Albumin: 3.7 g/dL (ref 3.4–5.0)
Alk. phosphatase: 101 U/L (ref 45–117)
Anion gap: 7 mmol/L (ref 3.0–18)
BUN/Creatinine ratio: 15 (ref 12–20)
BUN: 15 mg/dL (ref 7.0–18)
Bilirubin, total: 0.6 mg/dL (ref 0.2–1.0)
CO2: 28 mmol/L (ref 21–32)
Calcium: 9.6 mg/dL (ref 8.5–10.1)
Chloride: 104 mmol/L (ref 100–111)
Creatinine: 1.03 mg/dL (ref 0.6–1.3)
GFR est AA: >60 mL/min/{1.73_m2} (ref >60)
GFR est non-AA: 55 mL/min/{1.73_m2} — ABNORMAL LOW (ref >60)
Globulin: 3.1 g/dL (ref 2.0–4.0)
Glucose: 151 mg/dL — ABNORMAL HIGH (ref 74–99)
Potassium: 4.7 mmol/L (ref 3.5–5.5)
Protein, total: 6.8 g/dL (ref 6.4–8.2)
Sodium: 139 mmol/L (ref 136–145)

## 2020-11-23 LAB — CBC WITH AUTOMATED DIFF
ABS. BASOPHILS: 0.1 10*3/uL (ref 0.0–0.1)
ABS. EOSINOPHILS: 0.2 10*3/uL (ref 0.0–0.4)
ABS. IMM. GRANS.: 0 10*3/uL (ref 0.00–0.04)
ABS. LYMPHOCYTES: 2.7 10*3/uL (ref 0.9–3.6)
ABS. MONOCYTES: 0.5 10*3/uL (ref 0.05–1.2)
ABS. NEUTROPHILS: 5.9 10*3/uL (ref 1.8–8.0)
ABSOLUTE NRBC: 0 10*3/uL (ref 0.00–0.01)
BASOPHILS: 1 % (ref 0–2)
EOSINOPHILS: 2 % (ref 0–5)
HCT: 42.7 % (ref 35.0–45.0)
HGB: 13.2 g/dL (ref 12.0–16.0)
IMMATURE GRANULOCYTES: 0 % (ref 0.0–0.5)
LYMPHOCYTES: 29 % (ref 21–52)
MCH: 31.1 pg (ref 24.0–34.0)
MCHC: 30.9 g/dL — ABNORMAL LOW (ref 31.0–37.0)
MCV: 100.5 FL — ABNORMAL HIGH (ref 78.0–100.0)
MONOCYTES: 5 % (ref 3–10)
MPV: 10.3 fL (ref 9.2–11.8)
NEUTROPHILS: 63 % (ref 40–73)
NRBC: 0 /100{WBCs}
PLATELET: 238 10*3/uL (ref 135–420)
RBC: 4.25 M/uL (ref 4.20–5.30)
RDW: 15.2 % — ABNORMAL HIGH (ref 11.6–14.5)
WBC: 9.4 10*3/uL (ref 4.6–13.2)

## 2020-11-23 LAB — MAGNESIUM
Magnesium: 2 mg/dL (ref 1.6–2.6)
Magnesium: 2 mg/dL (ref 1.6–2.6)

## 2020-11-23 LAB — LIPID PANEL
CHOL/HDL Ratio: 3.8 (ref 0–5.0)
Chol/HDL Ratio: 3.8 (ref 0–5.0)
Cholesterol, Total: 159 MG/DL (ref <200)
Cholesterol, total: 159 mg/dL (ref <200)
HDL Cholesterol: 42 mg/dL (ref 40–60)
HDL: 42 MG/DL (ref 40–60)
LDL Calculated: 85.8 MG/DL (ref 0–100)
LDL, calculated: 85.8 mg/dL (ref 0–100)
Triglyceride: 156 MG/DL — ABNORMAL HIGH (ref <150)
Triglycerides: 156 MG/DL — ABNORMAL HIGH (ref <150)
VLDL Cholesterol Calculated: 31.2 MG/DL
VLDL, calculated: 31.2 mg/dL

## 2020-11-23 LAB — HEMOGLOBIN A1C WITH EAG
Est. average glucose: 166 mg/dL
Hemoglobin A1c: 7.4 % — ABNORMAL HIGH (ref 4.2–5.6)

## 2020-11-23 LAB — CBC WITH AUTO DIFFERENTIAL
Basophils %: 1 % (ref 0–2)
Basophils Absolute: 0.1 10*3/uL (ref 0.0–0.1)
Eosinophils %: 2 % (ref 0–5)
Eosinophils Absolute: 0.2 10*3/uL (ref 0.0–0.4)
Granulocyte Absolute Count: 0 10*3/uL (ref 0.00–0.04)
Hematocrit: 42.7 % (ref 35.0–45.0)
Hemoglobin: 13.2 g/dL (ref 12.0–16.0)
Immature Granulocytes %: 0 % (ref 0.0–0.5)
Lymphocytes %: 29 % (ref 21–52)
Lymphocytes Absolute: 2.7 10*3/uL (ref 0.9–3.6)
MCH: 31.1 PG (ref 24.0–34.0)
MCHC: 30.9 g/dL — ABNORMAL LOW (ref 31.0–37.0)
MCV: 100.5 FL — ABNORMAL HIGH (ref 78.0–100.0)
MPV: 10.3 FL (ref 9.2–11.8)
Monocytes %: 5 % (ref 3–10)
Monocytes Absolute: 0.5 10*3/uL (ref 0.05–1.2)
NRBC Absolute: 0 10*3/uL (ref 0.00–0.01)
Neutrophils %: 63 % (ref 40–73)
Neutrophils Absolute: 5.9 10*3/uL (ref 1.8–8.0)
Nucleated RBCs: 0 PER 100 WBC
Platelets: 238 10*3/uL (ref 135–420)
RBC: 4.25 M/uL (ref 4.20–5.30)
RDW: 15.2 % — ABNORMAL HIGH (ref 11.6–14.5)
WBC: 9.4 10*3/uL (ref 4.6–13.2)

## 2020-11-23 LAB — COMPREHENSIVE METABOLIC PANEL
ALT: 25 U/L (ref 13–56)
AST: 24 U/L (ref 10–38)
Albumin/Globulin Ratio: 1.2 (ref 0.8–1.7)
Albumin: 3.7 g/dL (ref 3.4–5.0)
Alkaline Phosphatase: 101 U/L (ref 45–117)
Anion Gap: 7 mmol/L (ref 3.0–18)
BUN/Creatinine Ratio: 15 (ref 12–20)
BUN: 15 MG/DL (ref 7.0–18)
CO2: 28 mmol/L (ref 21–32)
Calcium: 9.6 MG/DL (ref 8.5–10.1)
Chloride: 104 mmol/L (ref 100–111)
Creatinine: 1.03 MG/DL (ref 0.6–1.3)
GFR African American: >60 mL/min/{1.73_m2} (ref >60)
Globulin: 3.1 g/dL (ref 2.0–4.0)
Glucose: 151 mg/dL — ABNORMAL HIGH (ref 74–99)
Potassium: 4.7 mmol/L (ref 3.5–5.5)
Sodium: 139 mmol/L (ref 136–145)
Total Bilirubin: 0.6 MG/DL (ref 0.2–1.0)
Total Protein: 6.8 g/dL (ref 6.4–8.2)
eGFR NON-AA: 55 mL/min/{1.73_m2} — ABNORMAL LOW (ref >60)

## 2020-11-23 LAB — HEMOGLOBIN A1C W/EAG
Estimated Avg Glucose: 166 mg/dL
Hemoglobin A1C: 7.4 % — ABNORMAL HIGH (ref 4.2–5.6)

## 2020-11-28 NOTE — Telephone Encounter (Signed)
-----   Message from Jasper Riling sent at 11/28/2020  2:37 PM EDT -----  Subject: Message to Provider    QUESTIONS  Information for Provider? Pt would like for someone to call her so that   she can get her labs scheduled.  ---------------------------------------------------------------------------  --------------  Rod Can INFO  [phone]; OK to leave message on voicemail  ---------------------------------------------------------------------------  --------------  SCRIPT ANSWERS  Relationship to Patient? Self

## 2020-11-29 ENCOUNTER — Ambulatory Visit: Attending: Internal Medicine | Primary: Internal Medicine

## 2020-11-29 NOTE — Telephone Encounter (Signed)
Patient reached, appt scheduled

## 2020-12-04 ENCOUNTER — Ambulatory Visit: Payer: MEDICARE | Primary: Internal Medicine

## 2020-12-08 ENCOUNTER — Ambulatory Visit: Payer: MEDICARE | Primary: Internal Medicine

## 2020-12-14 ENCOUNTER — Encounter: Payer: MEDICARE | Attending: Internal Medicine | Primary: Internal Medicine

## 2020-12-26 MED ORDER — CYCLOBENZAPRINE 10 MG TAB
10 mg | ORAL_TABLET | ORAL | 1 refills | Status: DC
Start: 2020-12-26 — End: 2021-01-31

## 2020-12-31 ENCOUNTER — Encounter

## 2021-01-01 MED ORDER — PREGABALIN 75 MG CAP
75 mg | ORAL_CAPSULE | Freq: Two times a day (BID) | ORAL | 1 refills | Status: DC
Start: 2021-01-01 — End: 2021-03-12

## 2021-01-01 NOTE — Telephone Encounter (Signed)
Patient no showed for her appointment on 12/14/2020.  Please reach out and schedule for follow-up with labs prior.

## 2021-01-01 NOTE — Telephone Encounter (Signed)
[place] [facility] [place] last fill date for Lyrica as 11/30/20 for a 30 d/s.     Last Visit: 08/15/20 with MD Unknown Jim  Next Appointment: no show 12/14/20  Previous Refill Encounter(s): 10/25/20 #60 with 1 refill    Requested Prescriptions     Pending Prescriptions Disp Refills    pregabalin (LYRICA) 75 mg capsule [Pharmacy Med Name: PREGABALIN 75 MG CAPSULE] 60 Capsule 1     Sig: Take 1 Capsule by mouth two (2) times a day. Max Daily Amount: 150 mg. Indications: neuropathic pain         For Pharmacy Admin Tracking Only    CPA in place:   Recommendation Provided To:   Intervention Detail: New Rx: 1, reason: Patient Preference  Gap Closed?:   Intervention Accepted By:   Time Spent (min): 5

## 2021-01-02 NOTE — Telephone Encounter (Signed)
LMTRC

## 2021-01-09 ENCOUNTER — Ambulatory Visit: Payer: MEDICARE | Primary: Internal Medicine

## 2021-01-12 ENCOUNTER — Encounter

## 2021-01-12 ENCOUNTER — Ambulatory Visit: Admit: 2021-01-12 | Discharge: 2021-01-12 | Payer: MEDICARE | Primary: Internal Medicine

## 2021-01-12 ENCOUNTER — Inpatient Hospital Stay: Admit: 2021-01-12 | Payer: MEDICARE | Primary: Internal Medicine

## 2021-01-12 DIAGNOSIS — I10 Essential (primary) hypertension: Secondary | ICD-10-CM

## 2021-01-12 LAB — LIPID PANEL
CHOL/HDL Ratio: 3.1 (ref 0–5.0)
Chol/HDL Ratio: 3.1 (ref 0–5.0)
Cholesterol, Total: 130 MG/DL (ref <200)
Cholesterol, total: 130 mg/dL (ref <200)
HDL Cholesterol: 42 mg/dL (ref 40–60)
HDL: 42 MG/DL (ref 40–60)
LDL Calculated: 52 MG/DL (ref 0–100)
LDL, calculated: 52 mg/dL (ref 0–100)
Triglyceride: 180 MG/DL — ABNORMAL HIGH (ref <150)
Triglycerides: 180 MG/DL — ABNORMAL HIGH (ref <150)
VLDL Cholesterol Calculated: 36 MG/DL
VLDL, calculated: 36 mg/dL

## 2021-01-12 LAB — CBC WITH AUTOMATED DIFF
ABS. BASOPHILS: 0 10*3/uL (ref 0.0–0.1)
ABS. EOSINOPHILS: 0.2 10*3/uL (ref 0.0–0.4)
ABS. IMM. GRANS.: 0 10*3/uL (ref 0.00–0.04)
ABS. LYMPHOCYTES: 2.4 10*3/uL (ref 0.9–3.6)
ABS. MONOCYTES: 0.5 10*3/uL (ref 0.05–1.2)
ABS. NEUTROPHILS: 6.4 10*3/uL (ref 1.8–8.0)
ABSOLUTE NRBC: 0 10*3/uL (ref 0.00–0.01)
BASOPHILS: 0 % (ref 0–2)
EOSINOPHILS: 2 % (ref 0–5)
HCT: 41.7 % (ref 35.0–45.0)
HGB: 13.6 g/dL (ref 12.0–16.0)
IMMATURE GRANULOCYTES: 0 % (ref 0.0–0.5)
LYMPHOCYTES: 25 % (ref 21–52)
MCH: 30.8 pg (ref 24.0–34.0)
MCHC: 32.6 g/dL (ref 31.0–37.0)
MCV: 94.3 fL (ref 78.0–100.0)
MONOCYTES: 5 % (ref 3–10)
MPV: 10.2 fL (ref 9.2–11.8)
NEUTROPHILS: 67 % (ref 40–73)
NRBC: 0 /100{WBCs}
PLATELET: 244 10*3/uL (ref 135–420)
RBC: 4.42 M/uL (ref 4.20–5.30)
RDW: 14.1 % (ref 11.6–14.5)
WBC: 9.5 10*3/uL (ref 4.6–13.2)

## 2021-01-12 LAB — METABOLIC PANEL, COMPREHENSIVE
A-G Ratio: 1.1 (ref 0.8–1.7)
ALT (SGPT): 25 U/L (ref 13–56)
AST (SGOT): 18 U/L (ref 10–38)
Albumin: 3.5 g/dL (ref 3.4–5.0)
Alk. phosphatase: 107 U/L (ref 45–117)
Anion gap: 8 mmol/L (ref 3.0–18)
BUN/Creatinine ratio: 12 (ref 12–20)
BUN: 12 mg/dL (ref 7.0–18)
Bilirubin, total: 0.6 mg/dL (ref 0.2–1.0)
CO2: 29 mmol/L (ref 21–32)
Calcium: 9.5 mg/dL (ref 8.5–10.1)
Chloride: 104 mmol/L (ref 100–111)
Creatinine: 1.01 mg/dL (ref 0.6–1.3)
GFR est AA: >60 mL/min/{1.73_m2} (ref >60)
GFR est non-AA: 56 mL/min/{1.73_m2} — ABNORMAL LOW (ref >60)
Globulin: 3.1 g/dL (ref 2.0–4.0)
Glucose: 203 mg/dL — ABNORMAL HIGH (ref 74–99)
Potassium: 4.2 mmol/L (ref 3.5–5.5)
Protein, total: 6.6 g/dL (ref 6.4–8.2)
Sodium: 141 mmol/L (ref 136–145)

## 2021-01-12 LAB — MAGNESIUM
Magnesium: 2 mg/dL (ref 1.6–2.6)
Magnesium: 2 mg/dL (ref 1.6–2.6)

## 2021-01-12 LAB — HEMOGLOBIN A1C WITH EAG
Est. average glucose: 163 mg/dL
Hemoglobin A1c: 7.3 % — ABNORMAL HIGH (ref 4.2–5.6)

## 2021-01-12 LAB — VITAMIN D, 25 HYDROXY: Vitamin D 25-Hydroxy: 54.7 ng/mL (ref 30–100)

## 2021-01-12 LAB — CBC WITH AUTO DIFFERENTIAL
Basophils %: 0 % (ref 0–2)
Basophils Absolute: 0 10*3/uL (ref 0.0–0.1)
Eosinophils %: 2 % (ref 0–5)
Eosinophils Absolute: 0.2 10*3/uL (ref 0.0–0.4)
Granulocyte Absolute Count: 0 10*3/uL (ref 0.00–0.04)
Hematocrit: 41.7 % (ref 35.0–45.0)
Hemoglobin: 13.6 g/dL (ref 12.0–16.0)
Immature Granulocytes %: 0 % (ref 0.0–0.5)
Lymphocytes %: 25 % (ref 21–52)
Lymphocytes Absolute: 2.4 10*3/uL (ref 0.9–3.6)
MCH: 30.8 PG (ref 24.0–34.0)
MCHC: 32.6 g/dL (ref 31.0–37.0)
MCV: 94.3 FL (ref 78.0–100.0)
MPV: 10.2 FL (ref 9.2–11.8)
Monocytes %: 5 % (ref 3–10)
Monocytes Absolute: 0.5 10*3/uL (ref 0.05–1.2)
NRBC Absolute: 0 10*3/uL (ref 0.00–0.01)
Neutrophils %: 67 % (ref 40–73)
Neutrophils Absolute: 6.4 10*3/uL (ref 1.8–8.0)
Nucleated RBCs: 0 PER 100 WBC
Platelets: 244 10*3/uL (ref 135–420)
RBC: 4.42 M/uL (ref 4.20–5.30)
RDW: 14.1 % (ref 11.6–14.5)
WBC: 9.5 10*3/uL (ref 4.6–13.2)

## 2021-01-12 LAB — COMPREHENSIVE METABOLIC PANEL
ALT: 25 U/L (ref 13–56)
AST: 18 U/L (ref 10–38)
Albumin/Globulin Ratio: 1.1 (ref 0.8–1.7)
Albumin: 3.5 g/dL (ref 3.4–5.0)
Alkaline Phosphatase: 107 U/L (ref 45–117)
Anion Gap: 8 mmol/L (ref 3.0–18)
BUN/Creatinine Ratio: 12 (ref 12–20)
BUN: 12 MG/DL (ref 7.0–18)
CO2: 29 mmol/L (ref 21–32)
Calcium: 9.5 MG/DL (ref 8.5–10.1)
Chloride: 104 mmol/L (ref 100–111)
Creatinine: 1.01 MG/DL (ref 0.6–1.3)
GFR African American: >60 mL/min/{1.73_m2} (ref >60)
Globulin: 3.1 g/dL (ref 2.0–4.0)
Glucose: 203 mg/dL — ABNORMAL HIGH (ref 74–99)
Potassium: 4.2 mmol/L (ref 3.5–5.5)
Sodium: 141 mmol/L (ref 136–145)
Total Bilirubin: 0.6 MG/DL (ref 0.2–1.0)
Total Protein: 6.6 g/dL (ref 6.4–8.2)
eGFR NON-AA: 56 mL/min/{1.73_m2} — ABNORMAL LOW (ref >60)

## 2021-01-12 LAB — HEMOGLOBIN A1C W/EAG
Estimated Avg Glucose: 163 mg/dL
Hemoglobin A1C: 7.3 % — ABNORMAL HIGH (ref 4.2–5.6)

## 2021-01-12 LAB — VITAMIN D 25 HYDROXY: Vit D, 25-Hydroxy: 54.7 ng/mL (ref 30–100)

## 2021-01-12 NOTE — Telephone Encounter (Signed)
Lab orders placed.

## 2021-01-12 NOTE — Telephone Encounter (Signed)
Please enter lab orders for patient. 

## 2021-01-16 ENCOUNTER — Ambulatory Visit: Payer: MEDICARE | Primary: Internal Medicine

## 2021-01-16 ENCOUNTER — Ambulatory Visit: Payer: MEDICARE | Attending: Internal Medicine | Primary: Internal Medicine

## 2021-01-16 NOTE — Telephone Encounter (Signed)
-----   Message from Azell Der sent at 01/16/2021 10:39 AM EDT -----  Subject: Message to Provider    QUESTIONS  Information for Provider? Patient called after appointment time states she   do not feel well will not be in today.  ---------------------------------------------------------------------------  --------------  Rod Can INFO  QK:[id number]; OK to leave message on voicemail  ---------------------------------------------------------------------------  --------------  SCRIPT ANSWERS  Relationship to Patient? Self

## 2021-01-17 MED ORDER — LISINOPRIL 10 MG TAB
10 mg | ORAL_TABLET | ORAL | 1 refills | Status: DC
Start: 2021-01-17 — End: 2021-06-04

## 2021-01-17 MED ORDER — POTASSIUM CHLORIDE ER 20 MEQ TABLET,EXTENDED RELEASE
20 mEq | ORAL_TABLET | Freq: Every day | ORAL | 1 refills | Status: AC
Start: 2021-01-17 — End: ?

## 2021-01-23 ENCOUNTER — Ambulatory Visit: Attending: Internal Medicine | Primary: Internal Medicine

## 2021-01-23 ENCOUNTER — Ambulatory Visit: Admit: 2021-01-23 | Discharge: 2021-01-23 | Payer: MEDICARE | Attending: Internal Medicine | Primary: Internal Medicine

## 2021-01-23 ENCOUNTER — Encounter

## 2021-01-23 ENCOUNTER — Inpatient Hospital Stay: Admit: 2021-01-23 | Payer: MEDICARE | Primary: Internal Medicine

## 2021-01-23 DIAGNOSIS — Z23 Encounter for immunization: Secondary | ICD-10-CM

## 2021-01-23 DIAGNOSIS — E1140 Type 2 diabetes mellitus with diabetic neuropathy, unspecified: Secondary | ICD-10-CM

## 2021-01-23 LAB — MICROALBUMIN, UR, RAND W/ MICROALB/CREAT RATIO
Creatinine, urine random: 130 mg/dL — ABNORMAL HIGH (ref 30–125)
Microalbumin,urine random: 1.51 mg/dL (ref 0–3.0)
Microalbumin/Creat ratio (mg/g creat): 12 mg/g (ref 0–30)

## 2021-01-23 LAB — MICROALBUMIN / CREATININE URINE RATIO
Creatinine, Ur: 130 mg/dL — ABNORMAL HIGH (ref 30–125)
Microalb, Ur: 1.51 MG/DL (ref 0–3.0)
Microalb/Creat Ratio: 12 mg/g (ref 0–30)

## 2021-01-23 MED ORDER — OZEMPIC 2 MG/DOSE (8 MG/3 ML) SUBCUTANEOUS PEN INJECTOR
2 mg/dose (8 mg/3 mL) | SUBCUTANEOUS | 3 refills | Status: DC
Start: 2021-01-23 — End: 2021-01-25

## 2021-01-23 MED ORDER — SCOPOLAMINE (1.3-1.5) MG 72 HR TRANSDERM PATCH
13 mg over 3 days | MEDICATED_PATCH | TRANSDERMAL | 0 refills | Status: AC
Start: 2021-01-23 — End: 2021-05-30

## 2021-01-23 MED ORDER — OMEPRAZOLE 20 MG TAB, DELAYED RELEASE
20 mg | ORAL_TABLET | Freq: Every day | ORAL | 3 refills | Status: AC
Start: 2021-01-23 — End: ?

## 2021-01-23 NOTE — Progress Notes (Signed)
1. "Have you been to the ER, urgent care clinic since your last visit?  Hospitalized since your last visit?" No    2. "Have you seen or consulted any other health care providers outside of the [facility] since your last visit?" No     3. For patients aged 60-75: Has the patient had a colonoscopy / FIT/ Cologuard? Yes - Care Gap present. Most recent result on file      If the patient is female:    4. For patients aged 40-74: Has the patient had a mammogram within the past 2 years? Yes - no Care Gap present      5. For patients aged 21-65: Has the patient had a pap smear? Yes - no Care Gap present

## 2021-01-23 NOTE — Progress Notes (Signed)
HPI:   [name] Wu is a 60 y.o. year old female who presents today for a routine visit. She has a history of acute lymphoblastic leukemia, s/p stem cell transplant, hypertension, hyperlipidemia, diabetes mellitus, peripheral neuropathy, asthma, GERD, insomnia, and osteoarthritis. She is continuing to follow with the [facility] in [place].  She has completed the Moderna COVID-19 vaccine series and received two Pfizer booster doses given immunosuppressed status.  She reports that she is doing reasonably well.  She does report increasing difficulty with vertigo and states that she has experienced six falls over the last several weeks.  She states that she is noting persistent disequilibrium and is concerned that she may experience additional falls.  She states that she is planning to go on a cruise with her mother and other family members in 02/2021 and is concerned that her vertigo make it more difficult since she normally will have difficulty with motion sickness.  She also reports that she had a follow-up visit with Dr. Vinnie Level in 12/2020 and treatment with nilotinib was discontinued.  She states that she is now going to follow with him on an annual basis.  She is otherwise without new complaints and feeling generally well.       Summary of prior hospitalizations and medical history:   She has a history of acute lymphoblastic leukemia, diagnosed in 02/2017 after experiencing severe fatigue and bone pain for several months. She was positive for the  chromosome mutation. She presented to the [facility] in [place] for care, and underwent chemotherapy which achieved remission. She subsequently underwent a stem cell transplant on 08/14/2017, felt to be curative. She is now on maintenance therapy with a tyrosine kinase inhibitor, nilotimib 200 mg bid. She is followed by Dr. Noemi Chapel , transplant oncologist, and travels to [facility] every three months for care  as discussed. She is also followed locally by Dr. Edd Arbour.     He has a history of hypertension, treated with amlodipine. She has a history of hyperlipidemia, and was restarted on atorvastatin in 07/2019.  She also has a history of diabetes mellitus, currently being treated with Tresiba, Humalog, and Ozempic. She denies any polyuria, polydipsia, nocturia, or blurry vision, and has no history of retinopathy or nephropathy. 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 She was under the care of pain management and being treated with Cymbalta and Roxicodone as needed, but has noted a significant decrease in the need for narcotics since being started on pregabalin in 02/2020.    She has a history of asthma since childhood, and is being maintained on Flovent and albuterol as needed. She denies any cough or shortness of breath.     In 04/2018, she was admitted to [facility] from 12/4-12/10/2017 for a UTI and then subsequently readmitted from 12/6-12/17/2019 with MRSE bacteremia related to a PICC line and bibasilar pneumonia.     He has a history of GERD, treated with Protonix. She also underwent a screening colonoscopy in 05/2018 in [place]. Report unavailable. She denies any abdominal pain, nausea, vomiting, melena, hematochezia, or change in bowel movements.      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  Current Outpatient Medications   Medication Sig     semaglutide (Ozempic) 2 mg/dose (8 mg/3 mL) pnij 2 mg by SubCUTAneous route every seven (7) days.    scopolamine (TRANSDERM-SCOP) 1 mg over 3 days pt3d 1 Patch by TransDERmal route every seventy-two (72) hours. Indications: prevention of motion sickness    potassium chloride SR (K-TAB) 20 mEq tablet TAKE 2 TABLETS BY MOUTH DAILY    lisinopriL (PRINIVIL, ZESTRIL) 10 mg tablet TAKE 1 TABLET BY MOUTH EVERY DAY    pregabalin (LYRICA) 75 mg capsule Take 1 Capsule by mouth two (2) times a day. Max Daily Amount: 150 mg. Indications: neuropathic pain    cyclobenzaprine (FLEXERIL) 10 mg tablet TAKE 1 TABLET BY MOUTH THREE TIMES A DAY AS NEEDED FOR MUSCLE SPASMS    insulin lispro (HumaLOG Junior KwikPen U-100) 100 unit/mL inph CHECK BLOOD GLUCOSE PRIOR TO MEALS AND AT BEDTIME. TAKE ACCORDING TO SLIDING SCALE FOR BS  180-200 USE 2 UNITS, 201-22O USE 4 UNITS, 221-240 USE 5 UNITS, 241-280 USE 6 UNITS, 281-320 USE 7 UNITS, 321-400 USE 8 UNITS, GREATER THAN 400 CALL DOCTOR    Tresiba FlexTouch U-200 200 unit/mL (3 mL) inpn pen INJECT 32 UNITS  SUBCUTANEOUSLY DAILY    Nano Pen Needle 32 gauge x 5/32" ndle INJECT 4 TIMES A DAY WITH MEALS AND AT BEDTIME    flash glucose sensor (FreeStyle Libre 2 Sensor) kit Use as directed to check blood sugars at least 3 times daily.    flash glucose scanning reader (FreeStyle Libre 2 Reader) misc Use as directed to check blood sugars at least 3 times daily.    ondansetron (ZOFRAN ODT) 4 mg disintegrating tablet Take 1 Tablet by mouth every eight (8) hours as needed for Nausea.    atorvastatin (LIPITOR) 20 mg tablet TAKE 1 TABLET BY MOUTH EVERY DAY    DULoxetine (CYMBALTA) 30 mg capsule Take 1 Capsule by mouth two (2) times a day.    Flovent HFA 220 mcg/actuation inhaler TAKE 2 PUFFS BY INHALATION TWO (2) TIMES A DAY. INDICATIONS: CONTROLLER MEDICATION FOR ASTHMA    albuterol (PROVENTIL HFA, VENTOLIN HFA, PROAIR HFA) 90 mcg/actuation inhaler Take 1 Puff by inhalation every four (4) hours as  needed for Wheezing or Cough.    diphenhydrAMINE (BENADRYL) 25 mg capsule Take 25 mg by mouth every six (6) hours as needed.    ZINC PO Take  by mouth.    thiamine HCl (VITAMIN B-1 PO) Take  by mouth.    ascorbic acid (VITAMIN C PO) Take  by mouth.    BIOTIN PO Take 1 Tab by mouth daily.    calcium carb/vit D3/minerals (CALTRATE 600+D PLUS MINERALS PO) Take  by mouth daily.    OneTouch Ultra Blue Test Strip strip TEST 4 TIMES A DAY    triamcinolone acetonide (KENALOG) 0.1 % topical cream APPLY TO AFFECTED AREA TWICE A DAY AS NEEDED FOR RASH    levocetirizine dihydrochloride (XYZAL PO) Take  by mouth.    albuterol (PROVENTIL VENTOLIN) 2.5 mg /3 mL (0.083 %) nebu 3 mL by Nebulization route every four (4) hours as needed (wheezing).    magnesium  oxide (MAG-OX) 400 mg tablet Take 800 mg by mouth two (2) times a day.    prochlorperazine (COMPAZINE) 10 mg tablet Take 10 mg by mouth every four (4) hours as needed.    Magnesium Oxide-Mg AA Chelate 133 mg tab Take 1 Tab by mouth two (2) times a day.    cholecalciferol (VITAMIN D3) (5000 Units/125 mcg) tab tablet Take 5,000 Units by mouth daily.    acyclovir (ZOVIRAX) 400 mg tablet Take 800 mg by mouth two (2) times a day.    zolpidem (AMBIEN) 5 mg tablet Take 5 mg by mouth nightly.    oxyCODONE IR (ROXICODONE) 5 mg immediate release tablet TAKE 1 TABLET BY MOUTH EVERY 6 HOURS AS NEEDED FOR PAIN MANAGEMENT    Omeprazole delayed release (PRILOSEC D/R) 20 mg tablet Take 1 Tablet by mouth daily.     No current facility-administered medications for this visit.     Allergies and Intolerances:   Allergies   Allergen Reactions    Pcn [Penicillins] Hives and Itching     Family History: She has no family history of colon cancer of leukemia. Mother had breast cancer at the age of 74.   Family History   Problem Relation Age of Onset    Breast Cancer Mother      Social History:   She  reports that she has never smoked. She has never used smokeless tobacco. She is divorced and has no  children. She is a Optometrist, and working virtually due to the pandemic.     Social History     Substance and Sexual Activity   Alcohol Use No     Immunization History:  Immunization History   Administered Date(s) Administered    COVID-19, MODERNA BLUE border, Primary or Immunocompromised, (age 4428y+), IM, 100 mcg/0.44m 06/16/2019, 07/14/2019    COVID-19, PFIZER PURPLE top, DILUTE for use, (age 44251y+), IM, 387m/0.3mL 03/12/2020, 10/22/2020    Influenza Vaccine 02/27/2018, 02/23/2019, 03/01/2020    Influenza, FLUARIX, FLULAVAL, FLUZONE (age 55 40o+) AND AFLURIA, (age 44 63+), PF, 0.73m49m9/13/2022    Pneumococcal Conjugate PCV20, PF (Prevnar 20) 01/23/2021    Pneumococcal Polysaccharide (PPSV-23) 03/18/2019    Tdap 03/18/2019       Review of Systems:   As above included in HPI.  Otherwise 11 point review of systems negative including constitutional, skin, HENT, eyes, respiratory, cardiovascular, gastrointestinal, genitourinary, musculoskeletal, endocrine, hematologic, allergy, and neurologic.    Physical:   Visit Vitals  BP 134/80 (BP 1 Location: Left upper arm, BP Patient Position: Sitting)   Pulse 70   Temp 97.3 ??F (36.3 ??C) (Temporal)   Resp 16   Ht 5' 8"  (1.727 m)   Wt 211 lb (95.7 kg)   SpO2 98%   BMI 32.08 kg/m??       Patient appears in no apparent distress. Affect is appropriate.    HEENT: PERRLA, anicteric, no JVD, adenopathy or thyromegaly. No carotid bruits or radiated murmur.  Lungs: clear to auscultation, no wheezes, rhonchi, or rales.  Heart: regular rate and rhythm. No murmur, rubs, gallops  Abdomen: soft, nontender, nondistended, normal bowel sounds, no hepatosplenomegaly or masses.   Extremities: without edema.      Diabetic foot exam:     Left Foot:   Visual Exam: normal    Pulse DP: 2+ (normal)   Filament test: normal sensation    Vibratory sensation: normal      Right Foot:   Visual Exam: normal    Pulse DP: 2+ (  normal)   Filament test: normal sensation    Vibratory sensation: normal        Review of  Data:  Labs:  Hospital Outpatient Visit on 01/23/2021   Component Date Value Ref Range Status    Microalbumin,urine random 01/23/2021 1.51  0 - 3.0 MG/DL Final    Creatinine, urine 01/23/2021 130.00 (A) 30 - 125 mg/dL Final    Microalbumin/Creat ratio (mg/g cre* 01/23/2021 12  0 - 30 mg/g Final     Lab Results   Component Value Date/Time    WBC 9.5 01/12/2021 11:49 AM    HGB 13.6 01/12/2021 11:49 AM    HCT 41.7 01/12/2021 11:49 AM    PLATELET 244 01/12/2021 11:49 AM    MCV 94.3 01/12/2021 11:49 AM     Lab Results   Component Value Date/Time    Sodium 141 01/12/2021 11:49 AM    Potassium 4.2 01/12/2021 11:49 AM    Chloride 104 01/12/2021 11:49 AM    CO2 29 01/12/2021 11:49 AM    Anion gap 8 01/12/2021 11:49 AM    Glucose 203 (H) 01/12/2021 11:49 AM    BUN 12 01/12/2021 11:49 AM    Creatinine 1.01 01/12/2021 11:49 AM    BUN/Creatinine ratio 12 01/12/2021 11:49 AM    GFR est AA >60 01/12/2021 11:49 AM    GFR est non-AA 56 (L) 01/12/2021 11:49 AM    Calcium 9.5 01/12/2021 11:49 AM    Bilirubin, total 0.6 01/12/2021 11:49 AM    Alk. phosphatase 107 01/12/2021 11:49 AM    Protein, total 6.6 01/12/2021 11:49 AM    Albumin 3.5 01/12/2021 11:49 AM    Globulin 3.1 01/12/2021 11:49 AM    A-G Ratio 1.1 01/12/2021 11:49 AM    ALT (SGPT) 25 01/12/2021 11:49 AM    AST (SGOT) 18 01/12/2021 11:49 AM     Lab Results   Component Value Date/Time    Cholesterol, total 130 01/12/2021 11:49 AM    HDL Cholesterol 42 01/12/2021 11:49 AM    LDL, calculated 52 01/12/2021 11:49 AM    VLDL, calculated 36 01/12/2021 11:49 AM    Triglyceride 180 (H) 01/12/2021 11:49 AM    CHOL/HDL Ratio 3.1 01/12/2021 11:49 AM     Lab Results   Component Value Date/Time    Hemoglobin A1c 7.3 (H) 01/12/2021 11:49 AM     Lab Results   Component Value Date/Time    Vitamin D 25-Hydroxy 54.7 01/12/2021 11:49 AM       Magnesium   Date Value Ref Range Status   01/12/2021 2.0 1.6 - 2.6 mg/dL Final   11/22/2020 2.0 1.6 - 2.6 mg/dL Final   08/10/2020 2.2 1.6 - 2.6 mg/dL  Final   05/04/2020 2.2 1.6 - 2.6 mg/dL Final   06/17/2019 2.0 1.6 - 2.6 mg/dL Final             Health Maintenance:  Screening:    Mammogram: Right mammo/ultrasound negative (10/2020); bilateral due after 02/02/2021   PAP smear: well woman exam with NP Bjorn Loser (Pap/HPV negative on 02/15/2020)   Colorectal: colonoscopy (05/2018) normal.    Depression: none   DM (HbA1c/FPG): HbA1c 7.3 (01/2021)   Hepatitis C: negative (03/2019)   Falls: none   DEXA: unknown   Glaucoma: regular eye exams with Dr. Faustino Congress (last 10/2020)   Smoking: none   Vitamin D: 54.7 (01/2021)   Medicare Wellness: today        Impression:  Patient Active Problem List   Diagnosis  Code    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[place]) C91.01    S/P bone marrow transplant ([place]) Z94.81    Essential hypertension I10    Type 2 diabetes mellitus, with long-term current use of insulin ([facility]) E11.9, Z79.4    Peripheral neuropathy due to chemotherapy ([place]) G62.0, T45.1X5A    Mild intermittent asthma without complication V03.50    GERD (gastroesophageal reflux disease) K21.9    Eczema L30.9    Insomnia G47.00    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    Hyperlipidemia E78.5    Abnormal mammogram of right breast R92.8    Elevated alkaline phosphatase level R74.8    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 Acute lymphoblastic leukemia, +[place] chromosone mutation, s/p stem cell transplant. Diagnosed in 02/2017 and underwent induction chemotherapy followed by stem cell transplant in 08/2017.  Had been on maintenance therapy with a TKI (nilotinib), but reports discontinued in 12/2020 by her oncologist. Being followed closely by Dr. Noemi Chapel, transplant oncologist, at [facility] in [place].  Reports now frequency of follow-up has been decreased to annually.  Next scheduled to visit [facility] in 12/2021.  CBC remains normal today.  Will  continue to monitor.  2. Hypertension. Blood pressure remains well controlled on lisinopril 10 mg daily. Renal function remains normal with creatinine 1.01/ eGFR >60. On potassium and magnesium supplements due to long term difficulty with deficiency and stable levels today. Will continue to monitor.  3. Hyperlipidemia.  On moderate intensity dose atorvastatin with LDL 52 and HDL 42 today, indicative of excellent control. Emphasized importance of lifestyle modifications, including heart healthy diet, regular exercise, and weight loss. Continue to follow.  4. Diabetes mellitus. Previously well controlled on regimen of Tresiba and SS Humalog with HbA1c 6.5 in 03/2019. However, increased to HbA1c 7.1 in 06/2019 and 9.2 in 04/2020 likely related to weight gain.  Now prescribed freestyle continuous glucose monitor to help with close monitoring of blood sugars.  Started on Ozempic in 04/2020 and continues on Tresiba at 34 units daily, sliding scale Humalog.  Repeat HbA1c today improved to 7.3 today and will increase dose of Ozempic to 2 mg weekly.  No evidence of microvascular complications. On statin and on lisinopril since 04/2020.  Continue regular eye exams with Dr. Faustino Congress.  Foot exam grossly normal (08/2019), and no evidence of microalbuminuria with urine microalbumin/ creatinine ratio 12 mg/g. Emphasized importance of lifestyle modifications, including low carbohydrate diet, regular exercise, and weight loss.  Will reassess HbA1c in 3 months.  5. Peripheral neuropathy, painful. Thought to be secondary to chemotherapy. Did not tolerate gabapentin due to hallucinations. On Cymbalta 30 mg bid and Roxicodone through pain management.  However, weaned from [place] in 02/2020 and not wishing to restart.  Reported severe pain in feet at night with burning discomfort and cramping.  Started on pregabalin 75 mg twice daily and finding it to be very effective.  Using Flexeril as needed for cramping. Continue to monitor.  6.  Asthma, mild intermittent.  On Flovent for maintenance therapy and albuterol as needed. Reports stable symptoms today.  7. GERD. On Protonix 40 mg bid with reasonably good control. Follow.  8. Abnormal mammogram. Patient reports had previous biopsy of right breast which was benign. Screening mammogram (02/2019) showed multiple abnormalities in right breast. Right breast diagnostic mammogram and ultrasound (05/12/2019) showed probably  benign findings, but recommendation was to proceed with short interval 6 month follow-up (due 10/2019).  Underwent repeat diagnostic right mammogram and right breast ultrasound in 10/2020 and findings felt to be benign.  Recommended continuing with annual follow-up and due for bilateral screening in 02/2021.  Will place order today.  Patient with FH history of breast cancer in her mother.  9. Elevated alkaline phosphatase. Unclear if related to transplant and bone marrow process.  Patient reports that has been elevated previously.  GGT elevated today at 97 so likely of hepatic origin. AMA negative (04/2020) and alkaline phosphatase isozymes with normal differential and predominant hepatic origin (04/2020).  Right upper quadrant ultrasound ordered but patient never obtained.  Repeat alkaline phosphatase normalized today to 107 and may be related to discontinuation of nilotinib.  Will reassess at next visit.  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  Reports increasing difficulty with vertigo and states has had multiple falls over the last few weeks.  Continuing difficulty with disequilibrium.  Will place referral to balance therapy.  Advised use of meclizine as needed.  We will also prescribe scopolamine patch to use for an upcoming cruise as history of motion sickness.  Will continue to monitor.  11. Insomnia. Patient using Ambien +/- Benadryl as needed.  Emphasized good sleep hygiene.  Continue to monitor.  12. Eczema. On Xyzal and will use Kenalog as needed.  Controlled today.  13. Left trigger thumb.  Evaluated by Dr. Redmond Pulling in 04/2019 and received a cortisone injection with improvement.  Currently stable.  14.  Obesity. Weight has increased nearly 20 pounds since 12/2019.  Started treatment with Ozempic to help with weight loss and diabetes control as discussed, but weight has not changed.  Patient interested in referral to dietitian to help with weight loss and placed today.  Emphasized importance of continuing with lifestyle modifications, including heart healthy low carbohydrate diet, regular exercise, and weight loss.  15. Health maintenance. Completed 4 doses of the COVID 19 vaccine series due to immunosuppressed status.  Advised to proceed with updated booster dose this fall.  Will give influenza vaccine and Prevnar 20 today.  Also advised to obtain the Shingrix vaccine. Other immunizations up-to-date. Mammogram up-to-date. Patient obtained colonoscopy report from physicians in [place] and submitted to scanning.  Performed in 05/2018 to evaluate diarrhea s/p stem cell transplant and no lesions found.  Completed by NP Rojelio Brenner and chart updated.  Also completed eye exam with Dr. Faustino Congress.  Vitamin D level remains normal on maintenance dose supplement. Emphasized importance of lifestyle modifications, including heart healthy diet, regular exercise, and weight loss.     In addition, an annual Medicare wellness visit was done today.     Patient understands recommendations and agrees with plan.  Follow-up in 3 months.    This visit required high complexity medically necessary decision making and management plans.     Time spent in preparing for the visit, including review of history, tests done prior to arrival, additional time reviewing clinical data, imaging, outside records and test results:  5  minutes.  Time spent in counseling with patient and/or family members regarding care plan: 30 minutes.  Time spent in ordering tests, treatments, and referring patient for further care: 15 minutes.   Time spent  on visit does not include time for documentation.         Future orders:    ICD-10-CM ICD-9-CM    1. Type 2 diabetes mellitus with diabetic neuropathy, with long-term current use of insulin ([place])  E11.40 250.60 HM DIABETES FOOT EXAM    Z79.4 357.2 REFERRAL TO DIETITIAN     V58.67 HEMOGLOBIN A1C WITH EAG      MAGNESIUM      METABOLIC PANEL, COMPREHENSIVE      MICROALBUMIN, UR, RAND W/ MICROALB/CREAT RATIO      2. Essential hypertension  I10 401.9 MAGNESIUM      METABOLIC PANEL, COMPREHENSIVE      3. Peripheral neuropathy due to chemotherapy ([facility])  G62.0 357.7     T45.1X5A E933.1       4. Painful diabetic neuropathy ([facility])  E11.40 250.60      357.2       5. 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[facility])  C91.01 204.01 CBC WITH AUTOMATED DIFF      METABOLIC PANEL, COMPREHENSIVE      6. Imbalance  R26.89 781.2 REFERRAL TO PHYSICAL THERAPY      7. Dysequilibrium  R42 780.4 REFERRAL TO PHYSICAL THERAPY      8. Frequent falls  R29.6 V15.88 REFERRAL TO PHYSICAL THERAPY      9. Hyperlipidemia, unspecified hyperlipidemia type  E78.5 272.4 LIPID PANEL      METABOLIC PANEL, COMPREHENSIVE      10. S/P bone marrow transplant ([place])  Z94.81 V42.81       11. Mild intermittent asthma without complication  A41.66 063.01       12. Class 1 obesity due to excess calories with serious comorbidity and body mass index (BMI) of 32.0 to 32.9 in adult  E66.09 278.00 REFERRAL TO DIETITIAN    Z68.32 V85.32       13. Medicare annual wellness visit, subsequent  Z00.00 V70.0 ADVANCE CARE PLANNING FIRST 30 MINS      14. Advanced directives, counseling/discussion  Z71.89 V65.49 ADVANCE CARE PLANNING FIRST 30 MINS      15. Screening for depression  Z13.31 V79.0       16. Needs flu shot  Z23 V04.81 INFLUENZA, FLUARIX, FLULAVAL, FLUZONE (AGE 20 MO+), AFLURIA(AGE 3Y+) IM, PF, 0.5 ML      17. Encounter for immunization  Z23 V03.89 PR IMMUNIZ ADMIN,1 SINGLE/COMB VAC/TOXOID      PNEUMOCOCCAL, PCV20, PREVNAR 20, (AGE 23 YRS+), IM, PF      18. Encounter for  screening mammogram for malignant neoplasm of breast  Z12.31 V76.12 MAM MAMMO BI SCREENING INCL CAD      19. Vitamin D deficiency, unspecified  E55.9 268.9 VITAMIN D, 25 HYDROXY

## 2021-01-23 NOTE — Telephone Encounter (Signed)
Potassium was sent on 01/17/21 for #180 with 1 refill  Lyrica was sent on 01/01/21 for #60 with 1 refill    This med is listed as historical. Please sign if appropriate.    Last Visit: today with MD Unknown Jim  Next Appointment: 05/08/21 with MD Unknown Jim    Requested Prescriptions     Pending Prescriptions Disp Refills    Omeprazole delayed release (PRILOSEC D/R) 20 mg tablet 903 Tablet      Sig: Take 1 Tablet by mouth daily.         For [name] Wu in place:   Recommendation Provided To:   Intervention Detail: New Rx: 3, reason: Patient Preference  Gap Closed?:   Intervention Accepted By:   Time Spent (min): 5

## 2021-01-23 NOTE — Progress Notes (Signed)
This is the Subsequent Medicare Annual Wellness Exam, performed 12 months or more after the Initial AWV or the last Subsequent AWV    I have reviewed the patient's medical history in detail and updated the computerized patient record.       Assessment/Plan   Education and counseling provided:  Are appropriate based on today's review and evaluation  End-of-Life planning (with patient's consent)  Pneumococcal Vaccine  Influenza Vaccine  Screening Mammography  Screening Pap and pelvic (covered once every 2 years)  Colorectal cancer screening tests  Cardiovascular screening blood test  Bone mass measurement (DEXA)  Screening for glaucoma  Medical nutrition therapy for individuals with diabetes or renal disease  Updated COVID-19 booster dose    1. Type 2 diabetes mellitus with diabetic neuropathy, with long-term current use of insulin (HCC)  -     HM DIABETES FOOT EXAM  -     REFERRAL TO DIETITIAN  -     HEMOGLOBIN A1C WITH EAG; Future  -     MAGNESIUM; Future  -     METABOLIC PANEL, COMPREHENSIVE; Future  -     MICROALBUMIN, UR, RAND W/ MICROALB/CREAT RATIO; Future  2. Essential hypertension  -     MAGNESIUM; Future  -     METABOLIC PANEL, COMPREHENSIVE; Future  3. Peripheral neuropathy due to chemotherapy ([place])  4. Painful diabetic neuropathy ([place])  5. 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 Imbalance  -     REFERRAL TO PHYSICAL THERAPY  7. Dysequilibrium  -     REFERRAL TO PHYSICAL THERAPY  8. Frequent falls  -     REFERRAL TO PHYSICAL THERAPY  9. Hyperlipidemia, unspecified hyperlipidemia type  -     LIPID PANEL; Future  -     METABOLIC PANEL, COMPREHENSIVE; Future  10. S/P bone marrow transplant ([place])  11. Mild intermittent asthma without complication  12. Class 1 obesity due to excess calories with serious comorbidity and body mass index (BMI) of 32.0 to 32.9 in adult  -     REFERRAL TO DIETITIAN  13. Medicare annual  wellness visit, subsequent  -     ADVANCE CARE PLANNING FIRST 30 MINS  14. Advanced directives, counseling/discussion  -     ADVANCE CARE PLANNING FIRST 51 MINS  15. Screening for depression  16. Needs flu shot  -     INFLUENZA, FLUARIX, FLULAVAL, FLUZONE (AGE 74 MO+), AFLURIA(AGE 3Y+) IM, PF, 0.5 ML  17. Encounter for immunization  -     PR IMMUNIZ ADMIN,1 SINGLE/COMB VAC/TOXOID  -     PNEUMOCOCCAL, PCV20, PREVNAR 20, (AGE 3 YRS+), IM, PF  18. Encounter for screening mammogram for malignant neoplasm of breast  -     MAM MAMMO BI SCREENING INCL CAD; Future  19. Vitamin D deficiency, unspecified  -     VITAMIN D, 25 HYDROXY; Future       Depression Risk Factor Screening     3 most recent PHQ Screens 01/23/2021   Little interest or pleasure in doing things Not at all   Feeling down, depressed, irritable, or hopeless Not at all   Total Score PHQ 2 0       Alcohol & Drug Abuse Risk Screen    Do you average more than 1 drink per night or more than 7 drinks a week:  No  On any one occasion in the past three months have you have had more than 3 drinks containing alcohol:  No          Functional Ability and Level of Safety    Hearing: Hearing is good.      Activities of Daily Living:  The home contains: no safety equipment.  Patient does total self care      Ambulation: with mild difficulty     Fall Risk:  Fall Risk Assessment, last 12 mths 01/23/2021   Able to walk? Yes   Fall in past 12 months? 1   Do you feel unsteady? 1   Are you worried about falling 1   Number of falls in past 12 months 2   Fall with injury? 0      Abuse Screen:  Patient is not abused       Cognitive Screening    Has your family/caregiver stated any concerns about your memory: yes - some difficulty with short term recall     Cognitive Screening: none performed today    Health Maintenance Due     Health Maintenance Due   Topic Date Due    Shingrix Vaccine Age 66> (1 of 2) Never done    Colorectal Cancer Screening Combo  Never done       Patient Care  Team   Patient Care Team:  Blanch Media, MD as PCP - General (Internal Medicine Physician)  Blanch Media, MD as PCP - [facility] Empaneled Provider  Elenora Gamma, NP as Nurse Practitioner (Gynecology)  Adria Devon, MD (Ophthalmology)  [name], MD (Hematology and Oncology)    History     Patient Active Problem List   Diagnosis Code    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[place]) C91.01    S/P bone marrow transplant [facility]) Z94.81    Essential hypertension I10    Type 2 diabetes mellitus, with long-term current use of insulin ([place]) E11.9, Z79.4    Peripheral neuropathy due to chemotherapy ([place]) G62.0, T45.1X5A    Mild intermittent asthma without complication F64.33    GERD (gastroesophageal reflux disease) K21.9    Eczema L30.9    Insomnia G47.00    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    Hyperlipidemia E78.5    Elevated alkaline phosphatase level R74.8    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    Class 1 obesity due to excess calories with serious comorbidity in adult E66.09    Imbalance R26.89     Past Medical History:   Diagnosis Date    ALL (acute lymphoblastic leukemia) ([place]) 02/2017    s/p stem cell transplant 08/2017; [place] in [place]    Diabetes [facility])     GERD (gastroesophageal reflux disease) 03/18/2019    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  Current Outpatient Medications   Medication Sig Dispense Refill    semaglutide (Ozempic) 2 mg/dose (8 mg/3 mL) pnij 2 mg by SubCUTAneous route every seven (7) days. 12 Adjustable Dose Pre-filled Pen Syringe 3    scopolamine (TRANSDERM-SCOP) 1 mg over 3 days pt3d 1 Patch by TransDERmal route every seventy-two (72) hours. Indications: prevention  of motion sickness 10 Patch 0    potassium chloride SR (K-TAB) 20 mEq tablet TAKE 2 TABLETS BY MOUTH DAILY 180 Tablet 1    lisinopriL (PRINIVIL, ZESTRIL) 10 mg tablet TAKE 1 TABLET BY MOUTH EVERY DAY 90 Tablet 1    pregabalin (LYRICA) 75 mg capsule Take 1 Capsule by mouth two (2) times a day. Max Daily Amount: 150 mg. Indications: neuropathic pain 60 Capsule 1    cyclobenzaprine (FLEXERIL) 10 mg tablet TAKE 1 TABLET BY MOUTH THREE TIMES A DAY AS NEEDED FOR MUSCLE SPASMS 30 Tablet 1    insulin lispro (HumaLOG Junior KwikPen U-100) 100 unit/mL inph CHECK BLOOD GLUCOSE PRIOR TO MEALS AND AT BEDTIME. TAKE ACCORDING TO SLIDING SCALE FOR BS  180-200 USE 2 UNITS, 201-22O USE 4 UNITS, 221-240 USE 5 UNITS, 241-280 USE 6 UNITS, 281-320 USE 7 UNITS, 321-400 USE 8 UNITS, GREATER THAN 400 CALL DOCTOR 30 mL 3    Tresiba FlexTouch U-200 200 unit/mL (3 mL) inpn pen INJECT 32 UNITS  SUBCUTANEOUSLY DAILY 18 mL 3    Nano Pen Needle 32 gauge x 5/32" ndle INJECT 4 TIMES A DAY WITH MEALS AND AT BEDTIME 300 Pen Needle 3    flash glucose sensor (FreeStyle Libre 2 Sensor) kit Use as directed to check blood sugars at least 3 times daily. 2 Kit 5    flash glucose scanning reader (FreeStyle Libre 2 Reader) misc Use as directed to check blood sugars at least 3 times daily. 1 Each 0    ondansetron (ZOFRAN ODT) 4 mg disintegrating tablet Take 1 Tablet by mouth every eight (8) hours as needed for Nausea. 30 Tablet 0    atorvastatin (LIPITOR) 20 mg tablet TAKE 1 TABLET BY MOUTH EVERY DAY 90 Tablet 3    DULoxetine (CYMBALTA) 30 mg capsule Take 1 Capsule by mouth two (2) times a day. 180 Capsule 3    Flovent HFA 220 mcg/actuation inhaler TAKE 2 PUFFS BY INHALATION TWO (2) TIMES A DAY. INDICATIONS: CONTROLLER MEDICATION FOR ASTHMA 1 Inhaler 5    albuterol (PROVENTIL HFA, VENTOLIN HFA, PROAIR HFA) 90 mcg/actuation inhaler Take 1 Puff by inhalation every four (4) hours as needed for Wheezing or Cough. 1 Inhaler 3    diphenhydrAMINE (BENADRYL) 25 mg  capsule Take 25 mg by mouth every six (6) hours as needed.      ZINC PO Take  by mouth.      thiamine HCl (VITAMIN B-1 PO) Take  by mouth.      ascorbic acid (VITAMIN C PO) Take  by mouth.      BIOTIN PO Take 1 Tab by mouth daily.      calcium carb/vit D3/minerals (CALTRATE 600+D PLUS MINERALS PO) Take  by mouth daily.      OneTouch Ultra Blue Test Strip strip TEST 4 TIMES A DAY      triamcinolone acetonide (KENALOG) 0.1 % topical cream APPLY TO AFFECTED AREA TWICE A DAY AS NEEDED FOR RASH      levocetirizine dihydrochloride (XYZAL PO) Take  by mouth.      albuterol (PROVENTIL VENTOLIN) 2.5 mg /3 mL (0.083 %) nebu 3 mL by Nebulization route every four (4) hours as needed (wheezing). 120 Nebule 1    magnesium oxide (MAG-OX) 400 mg tablet Take 800 mg by mouth two (2) times a day.      prochlorperazine (COMPAZINE) 10 mg tablet Take 10 mg by mouth every four (4) hours as needed.      Magnesium Oxide-Mg AA  Chelate 133 mg tab Take 1 Tab by mouth two (2) times a day.      cholecalciferol (VITAMIN D3) (5000 Units/125 mcg) tab tablet Take 5,000 Units by mouth daily.      acyclovir (ZOVIRAX) 400 mg tablet Take 800 mg by mouth two (2) times a day.      zolpidem (AMBIEN) 5 mg tablet Take 5 mg by mouth nightly.      oxyCODONE IR (ROXICODONE) 5 mg immediate release tablet TAKE 1 TABLET BY MOUTH EVERY 6 HOURS AS NEEDED FOR PAIN MANAGEMENT      Omeprazole delayed release (PRILOSEC D/R) 20 mg tablet Take 1 Tablet by mouth daily. 90 Tablet 3     Allergies   Allergen Reactions    Pcn [Penicillins] Hives and Itching       Family History   Problem Relation Age of Onset    Breast Cancer Mother      Social History     Tobacco Use    Smoking status: Never    Smokeless tobacco: Never   Substance Use Topics    Alcohol use: No         Blanch Media, MD

## 2021-01-23 NOTE — Telephone Encounter (Signed)
Pt said she forgot to ask for refills at her appt .

## 2021-01-23 NOTE — ACP (Advance Care Planning) (Signed)
Advance Care Planning     Advance Care Planning (ACP) Physician/NP/PA Conversation      Date of Conversation: 01/23/2021  Conducted with: Patient with Decision Making Capacity    Healthcare Decision Maker:     Primary Decision Maker: [name] - Brother - [phone]    Secondary Decision Maker: Karleen Dolphin - Mother - [phone]  Click here to complete [place] including selection of the Healthcare Decision Maker Relationship (ie "Primary")      Today we documented Decision Maker(s) consistent with Legal Next of Kin hierarchy.    Care Preferences:    Hospitalization: "If your health worsens and it becomes clear that your chance of recovery is unlikely, what would be your preference regarding hospitalization?"  The patient would prefer hospitalization.    Ventilation: "If you were unable to breathe on your own and your chance of recovery was unlikely, what would be your preference about the use of a ventilator (breathing machine) if it was available to you?"   The patient would desire the use of a ventilator.    Resuscitation: "In the event your heart stopped as a result of an underlying serious health condition, would you want attempts to be made to restart your heart, or would you prefer a natural death?"   Yes, attempt to resuscitate.    Additional topics discussed: treatment goals, benefit/burden of treatment options, ventilation preferences, hospitalization preferences, resuscitation preferences, and end of life care preferences (vegetative state/imminent death)    Conversation Outcomes / Follow-Up Plan:   ACP in process - information provided, considering goals and options  Reviewed DNR/DNI and patient elects Full Code (Attempt Resuscitation)     Length of Voluntary ACP Conversation in minutes:  16 minutes    Blanch Media, MD

## 2021-01-25 MED ORDER — OZEMPIC 2 MG/DOSE (8 MG/3 ML) SUBCUTANEOUS PEN INJECTOR
2 mg/dose (8 mg/3 mL) | SUBCUTANEOUS | 3 refills | Status: DC
Start: 2021-01-25 — End: 2021-01-31

## 2021-01-25 NOTE — Telephone Encounter (Signed)
Pt  thought her Rx for Ozempic 2   was going to go to [facility] [address] , it was sent to mail delivery instead , she is asking for it to be canceled and sent to [place]  she is out of it

## 2021-01-25 NOTE — Telephone Encounter (Signed)
Prescription for [facility]empic [facility] to [facility] [address].  Unable to cancel prescription which went to mail order.  Please ask that she contact them to prevent them from filling.

## 2021-01-25 NOTE — Telephone Encounter (Signed)
[employer] and cancelled the ozempic order. Notified patient that the RX was re-sent to the local pharmacy.

## 2021-01-26 NOTE — Telephone Encounter (Signed)
For Pharmacy Admin Tracking Only    CPA in place:   Recommendation Provided To:   Intervention Detail: New Rx: 1, reason: Patient Preference  Gap Closed?:   Intervention Accepted By:   Time Spent (min): 5

## 2021-01-31 MED ORDER — TRULICITY 3 MG/0.5 ML SUBCUTANEOUS PEN INJECTOR
3 mg/0.5 mL | SUBCUTANEOUS | 1 refills | Status: DC
Start: 2021-01-31 — End: 2021-05-17

## 2021-01-31 MED ORDER — CYCLOBENZAPRINE 10 MG TAB
10 mg | ORAL_TABLET | ORAL | 1 refills | Status: DC
Start: 2021-01-31 — End: 2021-03-12

## 2021-01-31 NOTE — Telephone Encounter (Signed)
Patient agreeable to starting Trulicity please send to pharmacy. Please let me know once sent patient wants me to call her back with dosing directions.

## 2021-01-31 NOTE — Telephone Encounter (Signed)
Pres[facility] for Trulicity 3 mg weekly [facility]  Please let her know.

## 2021-01-31 NOTE — Telephone Encounter (Signed)
Patient left a message looking for the Ozempic 2 mg (new dose). This was sent to the pharmacy on 01/25/21. I contacted the pharmacy and they informed me the Rx is needing a PA. The insurance only covers the 1 mg dose without a PA. Please contact the patient's plan to initiate a prior authorization. Patient is out of medication.        For [place] in place:   Recommendation Provided To:   Intervention Detail: New Rx: 1, reason: Cost/Formulary Change  Gap Closed?:   Intervention Accepted By:   Time Spent (min): 5

## 2021-02-01 NOTE — Telephone Encounter (Signed)
Notified patient yesterday that the RX would be sent in the same day. She said she would wait for the pharmacy to tell her its ready.

## 2021-02-20 ENCOUNTER — Telehealth: Attending: Internal Medicine | Primary: Internal Medicine

## 2021-02-20 ENCOUNTER — Telehealth: Admit: 2021-02-20 | Discharge: 2021-02-20 | Payer: MEDICARE | Attending: Internal Medicine | Primary: Internal Medicine

## 2021-02-20 DIAGNOSIS — J4521 Mild intermittent asthma with (acute) exacerbation: Secondary | ICD-10-CM

## 2021-02-20 MED ORDER — PREDNISONE 20 MG TAB
20 mg | ORAL_TABLET | Freq: Every day | ORAL | 0 refills | Status: AC
Start: 2021-02-20 — End: 2021-02-25

## 2021-02-20 MED ORDER — AZITHROMYCIN 250 MG TAB
250 mg | ORAL_TABLET | ORAL | 0 refills | Status: AC
Start: 2021-02-20 — End: 2021-02-25

## 2021-02-20 NOTE — Progress Notes (Signed)
1. "Have you been to the ER, urgent care clinic since your last visit?  Hospitalized since your last visit?"  Yes [facility]     2. "Have you seen or consulted any other health care providers outside of the [facility] since your last visit?" No     3. For patients aged 60-75: Has the patient had a colonoscopy / FIT/ Cologuard? No      If the patient is female:    4. For patients aged 39-74: Has the patient had a mammogram within the past 2 years? Yes - no Care Gap present      5. For patients aged 21-65: Has the patient had a pap smear? Yes - no Care Gap present

## 2021-02-20 NOTE — Progress Notes (Signed)
[name] Wu is a 60 y.o. female who was seen by synchronous (real-time) audio-video technology on 02/20/2021 for Cold Symptoms (Pateint reports cold symptoms stated following a recent cruise. Cough, sinus symptoms, upper back pain. [facility] at [facility] test negative. Was given RX for nasal spray and tessalon. )    HPI:   [name] Wu is a 60 y.o. year old female who presents today for a routine visit. She has a history of acute lymphoblastic leukemia, s/p stem cell transplant, hypertension, hyperlipidemia, diabetes mellitus, peripheral neuropathy, asthma, GERD, insomnia, and osteoarthritis. She is continuing to follow with the [facility] in [place].  She has completed the Moderna COVID-19 vaccine series and received two Pfizer booster doses and the updated bivalent Pfizer booster dose.  She reports that she returned from a cruise on 02/15/2021 and noted the onset of nasal congestion, postnasal drainage, sore throat, and cough.  She denied any fever or significant dyspnea.  She presented to [facility] on 02/18/2021 for evaluation and was prescribed a nasal spray and Tessalon for cough.  She performed home rapid antigen COVID-19 testing on 10/8 and 02/19/2021 both of which were negative.  She states that her symptoms have worsened with increasing cough productive of yellow sputum, and she developed right-sided upper back pain worsened with inspiration and coughing.  She also noted a watery discharge from both eyes today.  She notes mild dyspnea and has been using her albuterol nebulizer treatment.  She is otherwise without new complaints.           Summary of prior hospitalizations and medical history:   She has a history of acute lymphoblastic leukemia, diagnosed in 02/2017 after experiencing severe fatigue and bone pain for several months. She was positive for the [place] chromosome mutation. She presented to the [facility] in [place] for care,  and underwent chemotherapy which achieved remission. She subsequently underwent a stem cell transplant on 08/14/2017, felt to be curative. She is now on maintenance therapy with a tyrosine kinase inhibitor, nilotimib 200 mg bid. She is followed by Dr. Noemi Chapel , transplant oncologist, and had been traveling to [facility] every three months for care. She had a follow-up visit with Dr. Vinnie Level in 12/2020 and treatment with nilotinib was discontinued and follow-up recommended on an annual basis.     He has a history of hypertension, treated with amlodipine. She has a history of hyperlipidemia, and was restarted on atorvastatin in 07/2019.  She also has a history of diabetes mellitus, currently being treated with Tresiba, Humalog, and Ozempic. She denies any polyuria, polydipsia, nocturia, or blurry vision, and has no history of retinopathy or nephropathy. 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 She was under the care of pain management and being treated with Cymbalta and Roxicodone as needed, but has noted a significant decrease in the need for narcotics since being started on pregabalin in 02/2020.    She has a history of asthma since childhood, and is being maintained on Flovent and albuterol as needed. She denies any cough or shortness of breath.     In 04/2018, she was admitted to [facility] from 12/4-12/10/2017 for a UTI and then subsequently readmitted from 12/6-12/17/2019 with MRSE bacteremia related to a PICC line and bibasilar pneumonia.     He has a history of GERD, treated with Protonix. She also underwent a screening colonoscopy in 05/2018 in [place].  Report unavailable. She denies any abdominal pain, nausea, vomiting, melena, hematochezia, or change in bowel movements.      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  Current Outpatient Medications   Medication Sig    predniSONE (DELTASONE) 20 mg tablet Take 2 Tablets by mouth daily (with breakfast) for 5 days. Indications: worsening asthma    azithromycin (ZITHROMAX) 250 mg tablet Take 2 tablets today, then take 1 tablet daily    cyclobenzaprine (FLEXERIL) 10 mg tablet TAKE 1 TABLET BY MOUTH THREE TIMES A DAY AS NEEDED FOR MUSCLE SPASMS    dulaglutide (Trulicity) 3 QM/0.8 mL pnij 3 mg by SubCUTAneous route every seven (7) days. Indications: type 2 diabetes mellitus    oxyCODONE IR (ROXICODONE) 5 mg immediate release tablet TAKE 1 TABLET BY MOUTH EVERY 6 HOURS AS NEEDED FOR PAIN MANAGEMENT    scopolamine (TRANSDERM-SCOP) 1 mg over 3 days pt3d 1 Patch by TransDERmal route every seventy-two (72) hours. Indications: prevention of motion sickness    Omeprazole delayed release (PRILOSEC D/R) 20 mg tablet Take 1 Tablet by mouth daily.    potassium chloride SR (K-TAB) 20 mEq tablet TAKE 2 TABLETS BY MOUTH DAILY    lisinopriL (PRINIVIL, ZESTRIL) 10 mg tablet TAKE 1 TABLET BY MOUTH EVERY DAY    pregabalin (LYRICA) 75 mg capsule Take 1 Capsule by mouth two (2) times a day. Max Daily Amount: 150 mg. Indications: neuropathic pain    insulin lispro (HumaLOG Junior KwikPen U-100) 100 unit/mL inph CHECK BLOOD GLUCOSE PRIOR TO MEALS AND AT BEDTIME. TAKE ACCORDING TO SLIDING SCALE FOR BS  180-200 USE 2 UNITS, 201-22O USE 4 UNITS, 221-240 USE 5 UNITS, 241-280 USE 6 UNITS, 281-320 USE 7 UNITS, 321-400 USE 8 UNITS, GREATER THAN 400 CALL DOCTOR    Tresiba FlexTouch U-200 200 unit/mL (3 mL) inpn pen INJECT 32 UNITS   SUBCUTANEOUSLY DAILY    Nano Pen Needle 32 gauge x 5/32" ndle INJECT 4 TIMES A DAY WITH MEALS AND AT BEDTIME    flash glucose sensor (FreeStyle Libre 2 Sensor) kit Use as directed to check blood sugars at least 3 times daily.    flash glucose scanning reader (FreeStyle Libre 2 Reader) misc Use as directed to check blood sugars at least 3 times daily.    ondansetron (ZOFRAN ODT) 4 mg disintegrating tablet Take 1 Tablet by mouth every eight (8) hours as needed for Nausea.    atorvastatin (LIPITOR) 20 mg tablet TAKE 1 TABLET BY MOUTH EVERY DAY    DULoxetine (CYMBALTA) 30 mg capsule Take 1 Capsule by mouth two (2) times a day.    Flovent HFA 220 mcg/actuation inhaler TAKE 2 PUFFS BY INHALATION TWO (2) TIMES A DAY. INDICATIONS: CONTROLLER MEDICATION FOR ASTHMA    albuterol (PROVENTIL HFA, VENTOLIN HFA, PROAIR HFA) 90 mcg/actuation inhaler Take 1 Puff by inhalation every four (4) hours as needed for Wheezing or Cough.  diphenhydrAMINE (BENADRYL) 25 mg capsule Take 25 mg by mouth every six (6) hours as needed.    ZINC PO Take  by mouth.    thiamine HCl (VITAMIN B-1 PO) Take  by mouth.    ascorbic acid (VITAMIN C PO) Take  by mouth.    BIOTIN PO Take 1 Tab by mouth daily.    calcium carb/vit D3/minerals (CALTRATE 600+D PLUS MINERALS PO) Take  by mouth daily.    OneTouch Ultra Blue Test Strip strip TEST 4 TIMES A DAY    triamcinolone acetonide (KENALOG) 0.1 % topical cream APPLY TO AFFECTED AREA TWICE A DAY AS NEEDED FOR RASH    levocetirizine dihydrochloride (XYZAL PO) Take  by mouth.    albuterol (PROVENTIL VENTOLIN) 2.5 mg /3 mL (0.083 %) nebu 3 mL by Nebulization route every four (4) hours as needed (wheezing).    magnesium oxide (MAG-OX) 400 mg tablet Take 800 mg by mouth two (2) times a day.    prochlorperazine (COMPAZINE) 10 mg tablet Take 10 mg by mouth every four (4) hours as needed.    Magnesium Oxide-Mg AA Chelate 133 mg tab Take 1 Tab by mouth two (2) times a day.    cholecalciferol (VITAMIN D3) (5000 Units/125  mcg) tab tablet Take 5,000 Units by mouth daily.    acyclovir (ZOVIRAX) 400 mg tablet Take 800 mg by mouth two (2) times a day.    zolpidem (AMBIEN) 5 mg tablet Take 5 mg by mouth nightly.     No current facility-administered medications for this visit.     Allergies and Intolerances:   Allergies   Allergen Reactions    Pcn [Penicillins] Hives and Itching     Family History: She has no family history of colon cancer of leukemia. Mother had breast cancer at the age of 60.   Family History   Problem Relation Age of Onset    Breast Cancer Mother      Social History:   She  reports that she has never smoked. She has never used smokeless tobacco. She is divorced and has no children. She is a Optometrist, and working virtually due to the pandemic.     Social History     Substance and Sexual Activity   Alcohol Use No     Immunization History:  Immunization History   Administered Date(s) Administered    COVID-19, MODERNA BLUE border, Primary or Immunocompromised, (age 598y+), IM, 100 mcg/0.36m 06/16/2019, 07/14/2019    COVID-19, PFIZER Bivalent BOOSTER, (age 592y+), IM, 30 mcg/0.3 mL 01/29/2021    COVID-19, PFIZER PURPLE top, DILUTE for use, (age 592y+), IM, 329m/0.3mL 03/12/2020, 10/22/2020    Influenza Vaccine 02/27/2018, 02/23/2019, 03/01/2020    Influenza, FLUARIX, FLULAVAL, FLUZONE (age 60 31o+) AND AFLURIA, (age 39 74+), PF, 0.26m66m9/13/2022    Pneumococcal Conjugate PCV20, PF (Prevnar 20) 01/23/2021    Pneumococcal Polysaccharide (PPSV-23) 03/18/2019    Tdap 03/18/2019       Review of Systems:   As above included in HPI.  Otherwise 11 point review of systems negative including constitutional, skin, HENT, eyes, respiratory, cardiovascular, gastrointestinal, genitourinary, musculoskeletal, endocrine, hematologic, allergy, and neurologic.    Physical:   There were no vitals taken for this visit.    Patient lying in bed with intermittent coughing but breathing comfortably without respiratory distress.    Constitutional: [x]   Appears well-developed and well-nourished [x]  No apparent distress      []  Abnormal -     Mental status: [x]  Alert and  awake  [x]  Oriented to person/place/time [x]  Able to follow commands    []  Abnormal -     Eyes:   EOM    [x]   Normal    []  Abnormal -   Sclera  [x]   Normal    []  Abnormal -          Discharge [x]   None visible   []  Abnormal -     HENT: [x]  Normocephalic, atraumatic  []  Abnormal -   [x]  Mouth/Throat: Mucous membranes are moist    External Ears [x]  Normal  []  Abnormal -    Neck: [x]  No visualized mass []  Abnormal -     Pulmonary/Chest: [x]  Respiratory effort normal   [x]  No visualized signs of difficulty breathing or respiratory distress        []  Abnormal -      Musculoskeletal:   []  Normal gait with no signs of ataxia         [x]  Normal range of motion of neck        []  Abnormal -     Neurological:        [x]  No Facial Asymmetry (Cranial nerve 7 motor function) (limited exam due to video visit)          [x]  No gaze palsy        []  Abnormal -          Skin:        [x]  No significant exanthematous lesions or discoloration noted on facial skin         []  Abnormal -            Psychiatric:       [x]  Normal Affect []  Abnormal -        [x]  No Hallucinations    Other pertinent observable physical exam findings:-      Review of Data:  Labs:  No visits with results within 2 Day(s) from this visit.   Latest known visit with results is:   Hospital Outpatient Visit on 01/23/2021   Component Date Value Ref Range Status    Microalbumin,urine random 01/23/2021 1.51  0 - 3.0 MG/DL Final    Creatinine, urine random 01/23/2021 130.00 (A)  30 - 125 mg/dL Final    Microalbumin/Creat ratio (mg/g cre* 01/23/2021 12  0 - 30 mg/g Final     Lab Results   Component Value Date/Time    WBC 9.5 01/12/2021 11:49 AM    HGB 13.6 01/12/2021 11:49 AM    HCT 41.7 01/12/2021 11:49 AM    PLATELET 244 01/12/2021 11:49 AM    MCV 94.3 01/12/2021 11:49 AM     Lab Results   Component Value Date/Time    Sodium 141 01/12/2021 11:49 AM     Potassium 4.2 01/12/2021 11:49 AM    Chloride 104 01/12/2021 11:49 AM    CO2 29 01/12/2021 11:49 AM    Anion gap 8 01/12/2021 11:49 AM    Glucose 203 (H) 01/12/2021 11:49 AM    BUN 12 01/12/2021 11:49 AM    Creatinine 1.01 01/12/2021 11:49 AM    BUN/Creatinine ratio 12 01/12/2021 11:49 AM    GFR est AA >60 01/12/2021 11:49 AM    GFR est non-AA 56 (L) 01/12/2021 11:49 AM    Calcium 9.5 01/12/2021 11:49 AM    Bilirubin, total 0.6 01/12/2021 11:49 AM    Alk. phosphatase 107 01/12/2021 11:49 AM    Protein, total 6.6 01/12/2021 11:49 AM    Albumin  3.5 01/12/2021 11:49 AM    Globulin 3.1 01/12/2021 11:49 AM    A-G Ratio 1.1 01/12/2021 11:49 AM    ALT (SGPT) 25 01/12/2021 11:49 AM    AST (SGOT) 18 01/12/2021 11:49 AM     Lab Results   Component Value Date/Time    Cholesterol, total 130 01/12/2021 11:49 AM    HDL Cholesterol 42 01/12/2021 11:49 AM    LDL, calculated 52 01/12/2021 11:49 AM    VLDL, calculated 36 01/12/2021 11:49 AM    Triglyceride 180 (H) 01/12/2021 11:49 AM    CHOL/HDL Ratio 3.1 01/12/2021 11:49 AM     Lab Results   Component Value Date/Time    Hemoglobin A1c 7.3 (H) 01/12/2021 11:49 AM     Lab Results   Component Value Date/Time    Vitamin D 25-Hydroxy 54.7 01/12/2021 11:49 AM       Magnesium   Date Value Ref Range Status   01/12/2021 2.0 1.6 - 2.6 mg/dL Final   11/22/2020 2.0 1.6 - 2.6 mg/dL Final   08/10/2020 2.2 1.6 - 2.6 mg/dL Final   05/04/2020 2.2 1.6 - 2.6 mg/dL Final   06/17/2019 2.0 1.6 - 2.6 mg/dL Final             Health Maintenance:  Screening:    Mammogram: Right mammo/ultrasound negative (10/2020); bilateral due after 02/02/2021   PAP smear: well woman exam with NP Bjorn Loser (Pap/HPV negative on 02/15/2020)   Colorectal: colonoscopy (05/2018) normal.  Due 05/2028.   Depression: none   DM (HbA1c/FPG): HbA1c 7.3 (01/2021)   Hepatitis C: negative (03/2019)   Falls: none   DEXA: unknown   Glaucoma: regular eye exams with Dr. Faustino Congress (last 10/2020)   Smoking: none   Vitamin D: 54.7 (01/2021)   Medicare  Wellness: 01/23/2021        Impression:  Patient Active Problem List   Diagnosis Code    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[facility]) C91.01    S/P bone marrow transplant ([facility]) Z94.81    Essential hypertension I10    Type 2 diabetes mellitus, with long-term current use of insulin ([facility]) E11.9, Z79.4    Peripheral neuropathy due to chemotherapy ([facility]) G62.0, T45.1X5A    Mild intermittent asthma J45.20    GERD (gastroesophageal reflux disease) K21.9    Eczema L30.9    Insomnia G47.00    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    Hyperlipidemia E78.5    Elevated alkaline phosphatase level R74.8    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  Asthma, mild intermittent, with acute exacerbation.  Right-sided upper back pain, pleuritic.  Patient with 5-day history of nasal congestion, postnasal drainage, cough, and sore throat developing after returning from a cruise.  COVID-19 rapid antigen testing negative x 2.  Not responding to symptomatic treatment with Tessalon and nasal spray and now with worsening cough productive of yellow sputum and right upper back pleuritic chest pain.  Also with mild dyspnea requiring use of albuterol nebulizer treatments.  No fever or chills.  Given worsening symptoms with productive cough, will treat with azithromycin for 5 days.  We will also prescribe prednisone 40 mg daily for 5 days to help manage acute exacerbation of underlying asthma.  Advised to continue Tessalon for cough and also begin Mucinex DM.  Advised to present to ED for any  worsening dyspnea.      Other chronic issues:  1. Acute lymphoblastic leukemia, +[place] chromosone mutation, s/p stem cell transplant. Diagnosed in 02/2017 and underwent induction chemotherapy followed by stem cell transplant in 08/2017.  Had been on maintenance therapy with a TKI (nilotinib), but reports discontinued  in 12/2020 by her oncologist. Being followed closely by Dr. Noemi Chapel, transplant oncologist, at [facility] in [place].  Reported frequency of follow-up had been decreased to annually with next scheduled visit to [facility] in 12/2021.  CBC remains normal (01/2021).  Will continue to monitor.  2. Hypertension. Blood pressure remains well controlled on lisinopril 10 mg daily. Renal function has been normal with creatinine 1.01/ eGFR >60. On potassium and magnesium supplements due to long term difficulty with deficiency and levels have been stable. Will continue to monitor.  3. Hyperlipidemia.  On moderate intensity dose atorvastatin with LDL 52 and HDL 42 (01/2021), indicative of excellent control. Emphasized importance of lifestyle modifications, including heart healthy diet, regular exercise, and weight loss. Continue to follow.  4. Diabetes mellitus. Previously well controlled on regimen of Tresiba and SS Humalog with HbA1c 6.5 in 03/2019. However, increased to HbA1c 7.1 in 06/2019 and 9.2 in 04/2020 likely related to weight gain.  Now prescribed freestyle continuous glucose monitor to help with close monitoring of blood sugars.  Started on Ozempic in 04/2020 and continues on Tresiba at 34 units daily, sliding scale Humalog.  Repeat HbA1c improved to 7.3 in 01/2021 and dose of Ozempic increased to 2 mg weekly.  No evidence of microvascular complications. On statin and on lisinopril since 04/2020.  Continue regular eye exams with Dr. Faustino Congress.  Foot exam grossly normal (08/2019), and no evidence of microalbuminuria with urine microalbumin/ creatinine ratio 12 mg/g. Emphasized importance of lifestyle modifications, including low carbohydrate diet, regular exercise, and weight loss.  Will reassess HbA1c in 3 months.  5. Peripheral neuropathy, painful. Thought to be secondary to chemotherapy. Did not tolerate gabapentin due to hallucinations. On Cymbalta 30 mg bid and Roxicodone through pain  management.  However, weaned from [place] in 02/2020 and not wishing to restart.  Reported severe pain in feet at night with burning discomfort and cramping.  Started on pregabalin 75 mg twice daily and finding it to be very effective.  Using Flexeril as needed for cramping. Continue to monitor.  6. Asthma, mild intermittent.  On Flovent for maintenance therapy and albuterol as needed.  Increasing difficulty today as discussed related to upper respiratory infection.  7. GERD. On Protonix 40 mg bid with reasonably good control. Follow.  8. Abnormal mammogram. Patient reports had previous biopsy of right breast which was benign. Screening mammogram (02/2019) showed multiple abnormalities in right breast. Right breast diagnostic mammogram and ultrasound (05/12/2019) showed probably benign findings, but recommendation was to proceed with 6 month follow-up.  Underwent repeat diagnostic right mammogram and right breast ultrasound in 10/2020 and findings felt to be benign.  Recommended continuing with annual follow-up and due for bilateral screening in 02/2021.  Urged to schedule.  Patient with FH history of breast cancer in her mother.  9. Elevated alkaline phosphatase. Unclear if related to transplant and bone marrow process.  Patient reports that has been elevated previously.  GGT elevated today at 97 so likely of hepatic origin. AMA negative (04/2020) and alkaline phosphatase isozymes with normal differential and predominant hepatic origin (04/2020).  Right upper quadrant ultrasound ordered but patient never obtained.  Repeat alkaline phosphatase normalized to 107 in 01/2021  and may be related to discontinuation of nilotinib.  Will reassess at next visit.  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  Reports increasing difficulty with vertigo and states has had multiple falls over the last few weeks.  Continuing difficulty with disequilibrium.  Referred to balance therapy after last visit in 01/2021, but has not scheduled.  Advised use  of meclizine as needed.  Also prescribed scopolamine patch for use on cruise as history of motion sickness.  Will readdress at next visit.  11. Insomnia. Patient using Ambien +/- Benadryl as needed.  Emphasized good sleep hygiene.  Continue to monitor.  12. Eczema. On Xyzal and will use Kenalog as needed.  Controlled today.  13. Left trigger thumb. Evaluated by Dr. Redmond Pulling in 04/2019 and received a cortisone injection with improvement.  Currently stable.  14.  Obesity. Weight had increased nearly 20 pounds since 12/2019.  Started treatment with Ozempic to help with weight loss and diabetes control as discussed, but weight had not changed.  Patient requested referral to dietitian to help with weight loss and scheduled for initial appointment later this month.  Emphasized importance of continuing with attempts lifestyle modifications, including heart healthy low carbohydrate diet, regular exercise, and weight loss.  15. Health maintenance. Completed 4 doses of the COVID 19 vaccine series due to immunosuppressed status and received the bivalent Pfizer booster dose.  Already received the influenza vaccine and Prevnar 20.  Advised to obtain the Shingrix vaccine. Other immunizations up-to-date.  Bilateral mammogram due in 02/2021 and order placed last visit but not yet scheduled. Patient obtained colonoscopy report from physicians in [place] and submitted to scanning following visit in 01/2021.  Colonoscopy was performed in 05/2018 to evaluate diarrhea s/p stem cell transplant and no lesions found.  Completed well woman exam with NP Rojelio Brenner and chart updated.  Also completed eye exam with Dr. Faustino Congress.  Vitamin D level has been normal on maintenance dose supplement. Emphasized importance of lifestyle modifications, including heart healthy diet, regular exercise, and weight loss. Medicare wellness visit up-to-date.     Patient understands recommendations and agrees with plan.  Follow-up as previously  scheduled.        Future orders:    ICD-10-CM ICD-9-CM    1. Mild intermittent asthma with acute exacerbation  J45.21 493.92       2. Acute bronchitis, unspecified organism  J20.9 466.0       3. Pleuritic chest pain  R07.81 786.52             We discussed the expected course, resolution and complications of the diagnosis(es) in detail.  Medication risks, benefits, costs, interactions, and alternatives were discussed as indicated.  I advised her to contact the office if her condition worsens, changes or fails to improve as anticipated. She expressed understanding with the diagnosis(es) and plan.     Blenda Bridegroom, was evaluated through a synchronous (real-time) audio-video encounter. The patient (or guardian if applicable) is aware that this is a billable service, which includes applicable co-pays. This Virtual Visit was conducted with patient's (and/or legal guardian's) consent. The visit was conducted pursuant to the emergency declaration under the [place], [place] waiver authority and the [employer] Act.  Patient identification was verified, and a caregiver was present when appropriate.  The patient was located at: Home: [address]  [place] [address]  The provider was located at: Facility (Appt Department): [facility]  [place] [address]        Blanch Media, MD

## 2021-02-26 ENCOUNTER — Encounter

## 2021-03-11 ENCOUNTER — Encounter

## 2021-03-12 ENCOUNTER — Inpatient Hospital Stay: Payer: MEDICARE | Primary: Internal Medicine

## 2021-03-12 MED ORDER — CYCLOBENZAPRINE 10 MG TAB
10 mg | ORAL_TABLET | ORAL | 1 refills | Status: DC
Start: 2021-03-12 — End: 2021-05-01

## 2021-03-12 MED ORDER — PREGABALIN 75 MG CAP
75 mg | ORAL_CAPSULE | Freq: Two times a day (BID) | ORAL | 1 refills | Status: DC
Start: 2021-03-12 — End: 2021-05-17

## 2021-03-12 NOTE — Telephone Encounter (Signed)
[place] the last fill date for Lyrica as 01/31/21 for a 30 d/s.     Last Visit: 02/20/21 with MD Unknown Jim  Next Appointment: 05/08/21 with MD Sarris  Previous Refill Encounter(s): 01/01/21 #60 with 1 refill    Requested Prescriptions     Pending Prescriptions Disp Refills    pregabalin (LYRICA) 75 mg capsule [Pharmacy Med Name: PREGABALIN 75 MG CAPSULE] 60 Capsule 1     Sig: TAKE 1 CAPSULE BY MOUTH TWO (2) TIMES A DAY. MAX DAILY AMOUNT: 150 MG. INDICATIONS: [place] in place:   Recommendation Provided To:   Intervention Detail: New Rx: 1, reason: Patient Preference  Gap Closed?:   Intervention Accepted By:   Time Spent (min): 5

## 2021-03-20 NOTE — Telephone Encounter (Signed)
 Please return call/triage/advise pt    I made her a virtual appt for tomorrow 04/20/21 with Dr Chet.    Pt has difficulty breathing & heaviness in chest x 6 days. pt will get seen at urgent care if symptoms worsen and or if that is what is recommended. stated she went to [place], [place] last week and any time she goes out of the area she gets this. said it is not asthma/ she was negative for covid home test 03/16/21. stated she won't to to ED is immunocompromised will go to urgent care if recommended     Also said she had pneumonia about 3 weeks ago

## 2021-03-20 NOTE — Telephone Encounter (Signed)
Called and spoke with patient.  Reports following virtual visit on 02/20/2021, responded to treatment with prednisone 40 mg daily x 5 days and Z-Pak with improvement upon completion with return to baseline.  Reports that travel to [place], [place] last week and noted the onset of increasing dyspnea, tightness in chest, coughing, and struggling to breathe.  Reports not responding to her inhaler.  Describes "elephant on her chest".  Noting to cough frequently during telephone conversation.  Denies any fever, chills, or sputum production.  Advised that would recommend evaluation in ED given lack of response to inhalers.  Patient urged to present to [facility] ED and eventually agreed.  Has virtual visit appointment scheduled for tomorrow and will follow-up at that time.

## 2021-03-21 ENCOUNTER — Telehealth: Admit: 2021-03-21 | Discharge: 2021-03-21 | Payer: MEDICARE | Attending: Internal Medicine | Primary: Internal Medicine

## 2021-03-21 DIAGNOSIS — J9801 Acute bronchospasm: Secondary | ICD-10-CM

## 2021-03-21 MED ORDER — BENZONATATE 100 MG CAP
100 mg | ORAL_CAPSULE | Freq: Three times a day (TID) | ORAL | 0 refills | Status: AC | PRN
Start: 2021-03-21 — End: 2021-03-31

## 2021-03-21 MED ORDER — ALBUTEROL SULFATE 0.083 % (0.83 MG/ML) SOLN FOR INHALATION
2.5 mg /3 mL (0.083 %) | RESPIRATORY_TRACT | 1 refills | Status: DC | PRN
Start: 2021-03-21 — End: 2021-05-17

## 2021-03-21 MED ORDER — NEBULIZER ACCESSORIES KIT
PACK | 0 refills | Status: AC | PRN
Start: 2021-03-21 — End: ?

## 2021-03-21 MED ORDER — NEBULIZER & COMPRESSOR
0 refills | Status: AC | PRN
Start: 2021-03-21 — End: ?

## 2021-03-21 MED ORDER — MONTELUKAST 10 MG TAB
10 mg | ORAL_TABLET | Freq: Every day | ORAL | 1 refills | Status: DC
Start: 2021-03-21 — End: 2021-05-17

## 2021-03-21 MED ORDER — NEBULIZER & COMPRESSOR
0 refills | Status: DC | PRN
Start: 2021-03-21 — End: 2021-03-21

## 2021-03-21 MED ORDER — NEBULIZER ACCESSORIES KIT
PACK | 0 refills | Status: DC | PRN
Start: 2021-03-21 — End: 2021-03-21

## 2021-03-21 MED ORDER — PREDNISONE 20 MG TAB
20 mg | ORAL_TABLET | Freq: Every day | ORAL | 0 refills | Status: AC
Start: 2021-03-21 — End: 2021-03-26

## 2021-03-21 NOTE — Progress Notes (Signed)
1. "Have you been to the ER, urgent care clinic since your last visit?  Hospitalized since your last visit?" No    2. "Have you seen or consulted any other health care providers outside of the [facility] since your last visit?" No     3. For patients aged 60-75: Has the patient had a colonoscopy / FIT/ Cologuard? Yes - no Care Gap present      If the patient is female:    4. For patients aged 40-74: Has the patient had a mammogram within the past 2 years? Yes - no Care Gap present      5. For patients aged 21-65: Has the patient had a pap smear? Yes - no Care Gap present

## 2021-03-21 NOTE — Progress Notes (Signed)
[name] Wu is a 60 y.o. female who was seen by synchronous (real-time) audio-video technology on 03/21/2021 for Breathing Problem (difficulty breathing & heaviness in chest x 6 days- did not go to the ED/Urgent Care)      HPI:   [name] Wu is a 60 y.o. year old female who presents today for an acute visit. 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 She is continuing to follow with the [facility] in [place].  She has completed the Moderna COVID-19 vaccine series and received two Pfizer booster doses and the updated bivalent booster dose.  She was last seen for a virtual visit on 02/20/2021 five days after returning from a cruise and reported nasal congestion, postnasal drainage, sore throat, and cough. She denied any fever or significant dyspnea.  She had undergone evaluation at [facility] on 02/18/2021 and was prescribed a nasal spray and Tessalon for cough.  Home rapid antigen COVID-19 testing on 10/8 and 02/19/2021 were negative.  She reported worsening symptoms with increasing cough productive of yellow sputum, right-sided upper back pain worsened with inspiration and coughing, and mild dyspnea responding to her albuterol inhaler.  She was treated with azithromycin, prednisone 40 mg daily for 5 days, and advised to continue albuterol, Mucinex DM, and Tessalon.  She reports today that she improved following treatment with return to baseline.  However, she states that last week she traveled to [place], [place] and upon returning noted a sore throat with worsening allergy symptoms and exacerbation of her asthma.  She performed a home rapid antigen COVID-19 test on 03/16/2021 which was negative.  She states that her dyspnea has progressed and she reports that over the last 2 days, she feels that she no longer is finding her inhaler to be  effective.  She reports that she is experiencing increasing cough with chest tightness and describes that she feels as if she has "an elephant on her chest".  She had contacted the office yesterday and was advised to present to the ED for evaluation but did not proceed.  She denies any fever, chills, lower extremity edema, chest pain, PND, or orthopnea.  She is otherwise without new complaints.           Summary of prior hospitalizations and medical history:   She has a history of acute lymphoblastic leukemia, diagnosed in 02/2017 after experiencing severe fatigue and bone pain for several months. She was positive for the [place] chromosome mutation. She presented to the [facility] in [place] for care, and underwent chemotherapy which achieved remission. She subsequently underwent a stem cell transplant on 08/14/2017, felt to be curative. She is now on maintenance therapy with a tyrosine kinase inhibitor, nilotimib 200 mg bid. She is followed by Dr. Noemi Chapel , transplant oncologist, and had been traveling to [facility] every three months for care. She had a follow-up visit with Dr. Vinnie Level in 12/2020 and treatment with nilotinib was discontinued and follow-up recommended on an annual basis.     He has a history of hypertension, treated with amlodipine. She has a history of hyperlipidemia, and was restarted on atorvastatin in 07/2019.  She also has a history of diabetes mellitus, currently being treated with Tresiba, Humalog, and Ozempic. She denies any polyuria, polydipsia, nocturia, or blurry vision, and has no history of retinopathy or nephropathy. 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 She was under the care of pain management and being treated with Cymbalta and Roxicodone as needed, but has noted a significant decrease in the need for narcotics since being started on pregabalin in 02/2020.    She has a history of asthma since  childhood, and is being maintained on Flovent and albuterol as needed. She denies any cough or shortness of breath.     In 04/2018, she was admitted to [facility] from 12/4-12/10/2017 for a UTI and then subsequently readmitted from 12/6-12/17/2019 with MRSE bacteremia related to a PICC line and bibasilar pneumonia.     He has a history of GERD, treated with Protonix. She also underwent a screening colonoscopy on 05/14/2018 by Dr. Jacquiline Doe at the [facility] in [place] to evaluate diarrhea post stem cell transplant.  No lesions were found. She denies any abdominal pain, nausea, vomiting, melena, hematochezia, or change in bowel movements.      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  Current Outpatient Medications   Medication Sig    predniSONE (DELTASONE) 20 mg tablet Take 2 Tablets by mouth daily (with breakfast) for 5 days. Indications: worsening asthma    benzonatate (TESSALON) 100 mg capsule Take 1 Capsule by mouth three (3) times daily as needed for Cough for up to 10 days. Indications: cough    montelukast (SINGULAIR) 10 mg tablet Take 1 Tablet by mouth daily. Indications: controller medication for asthma    albuterol (PROVENTIL VENTOLIN) 2.5 mg /3 mL (0.083 %) nebu 3 mL by Nebulization route every four (4) hours as needed (wheezing). Indications: asthma attack    Nebulizer Accessories kit by Does Not Apply route every four (4) hours as needed for Cough (Shortness of  breath).    Nebulizer & Compressor machine 1 Each by Does Not Apply route every four (4) hours as needed for Wheezing or Shortness of Breath.    pregabalin (LYRICA) 75 mg capsule TAKE 1 CAPSULE BY MOUTH TWO (2) TIMES A DAY. MAX DAILY AMOUNT: 150 MG. INDICATIONS: NEUROPATHIC PAIN    cyclobenzaprine (FLEXERIL) 10 mg tablet TAKE 1 TABLET BY MOUTH THREE TIMES A DAY AS NEEDED FOR MUSCLE SPASMS    dulaglutide (Trulicity) 3 DG/3.8 mL pnij 3 mg by SubCUTAneous route every seven (7) days. Indications: type 2 diabetes mellitus    oxyCODONE IR (ROXICODONE) 5 mg immediate release tablet TAKE 1 TABLET BY MOUTH EVERY 6 HOURS AS NEEDED FOR PAIN MANAGEMENT    scopolamine (TRANSDERM-SCOP) 1 mg over 3 days pt3d 1 Patch by TransDERmal route every seventy-two (72) hours. Indications: prevention of motion sickness    Omeprazole delayed release (PRILOSEC D/R) 20 mg tablet Take 1 Tablet by mouth daily.    potassium chloride SR (K-TAB) 20 mEq tablet TAKE 2 TABLETS BY MOUTH DAILY    lisinopriL (PRINIVIL, ZESTRIL) 10 mg tablet TAKE 1 TABLET BY  MOUTH EVERY DAY    insulin lispro (HumaLOG Junior KwikPen U-100) 100 unit/mL inph CHECK BLOOD GLUCOSE PRIOR TO MEALS AND AT BEDTIME. TAKE ACCORDING TO SLIDING SCALE FOR BS  180-200 USE 2 UNITS, 201-22O USE 4 UNITS, 221-240 USE 5 UNITS, 241-280 USE 6 UNITS, 281-320 USE 7 UNITS, 321-400 USE 8 UNITS, GREATER THAN 400 CALL DOCTOR    Tresiba FlexTouch U-200 200 unit/mL (3 mL) inpn pen INJECT 32 UNITS  SUBCUTANEOUSLY DAILY    Nano Pen Needle 32 gauge x 5/32" ndle INJECT 4 TIMES A DAY WITH MEALS AND AT BEDTIME    flash glucose sensor (FreeStyle Libre 2 Sensor) kit Use as directed to check blood sugars at least 3 times daily.    flash glucose scanning reader (FreeStyle Libre 2 Reader) misc Use as directed to check blood sugars at least 3 times daily.    ondansetron (ZOFRAN ODT) 4 mg disintegrating tablet Take 1 Tablet by mouth every eight (8) hours as needed for Nausea.    atorvastatin (LIPITOR) 20 mg tablet  TAKE 1 TABLET BY MOUTH EVERY DAY    DULoxetine (CYMBALTA) 30 mg capsule Take 1 Capsule by mouth two (2) times a day.    Flovent HFA 220 mcg/actuation inhaler TAKE 2 PUFFS BY INHALATION TWO (2) TIMES A DAY. INDICATIONS: CONTROLLER MEDICATION FOR ASTHMA    albuterol (PROVENTIL HFA, VENTOLIN HFA, PROAIR HFA) 90 mcg/actuation inhaler Take 1 Puff by inhalation every four (4) hours as needed for Wheezing or Cough.    diphenhydrAMINE (BENADRYL) 25 mg capsule Take 25 mg by mouth every six (6) hours as needed.    ZINC PO Take  by mouth.    thiamine HCl (VITAMIN B-1 PO) Take  by mouth.    ascorbic acid (VITAMIN C PO) Take  by mouth.    BIOTIN PO Take 1 Tab by mouth daily.    calcium carb/vit D3/minerals (CALTRATE 600+D PLUS MINERALS PO) Take  by mouth daily.    OneTouch Ultra Blue Test Strip strip TEST 4 TIMES A DAY    triamcinolone acetonide (KENALOG) 0.1 % topical cream APPLY TO AFFECTED AREA TWICE A DAY AS NEEDED FOR RASH    levocetirizine dihydrochloride (XYZAL PO) Take  by mouth.    magnesium oxide (MAG-OX) 400 mg tablet Take 800 mg by mouth two (2) times a day.    prochlorperazine (COMPAZINE) 10 mg tablet Take 10 mg by mouth every four (4) hours as needed.    Magnesium Oxide-Mg AA Chelate 133 mg tab Take 1 Tab by mouth two (2) times a day.    cholecalciferol (VITAMIN D3) (5000 Units/125 mcg) tab tablet Take 5,000 Units by mouth daily.    acyclovir (ZOVIRAX) 400 mg tablet Take 800 mg by mouth two (2) times a day.    zolpidem (AMBIEN) 5 mg tablet Take 5 mg by mouth nightly.     No current facility-administered medications for this visit.     Allergies and Intolerances:   Allergies   Allergen Reactions    Pcn [Penicillins] Hives and Itching     Family History: She has no family history of colon cancer of leukemia. Mother had breast cancer at the age of 17.   Family History   Problem Relation Age of Onset    Breast Cancer Mother      Social History:   She  reports that she has never smoked. She has never used smokeless  tobacco. She is divorced and has no children. She is a Optometrist, and working virtually  due to the pandemic.     Social History     Substance and Sexual Activity   Alcohol Use No     Immunization History:  Immunization History   Administered Date(s) Administered    COVID-19, MODERNA BLUE border, Primary or Immunocompromised, (age 778y+), IM, 100 mcg/0.96m 06/16/2019, 07/14/2019    COVID-19, PFIZER Bivalent BOOSTER, (age 772y+), IM, 30 mcg/0.3 mL dose 01/29/2021    COVID-19, PFIZER PURPLE top, DILUTE for use, (age 7741y+), IM, 359m/0.3mL 03/12/2020, 10/22/2020    Influenza Vaccine 02/27/2018, 02/23/2019, 03/01/2020    Influenza, FLUARIX, FLULAVAL, FLUZONE (age 52 51o+) AND AFLURIA, (age 12 30+), PF, 0.8m46m9/13/2022    Pneumococcal Conjugate PCV20, PF (Prevnar 20) 01/23/2021    Pneumococcal Polysaccharide (PPSV-23) 03/18/2019    Tdap 03/18/2019       Review of Systems:   As above included in HPI.  Otherwise 11 point review of systems negative including constitutional, skin, HENT, eyes, respiratory, cardiovascular, gastrointestinal, genitourinary, musculoskeletal, endocrine, hematologic, allergy, and neurologic.    Physical:   There were no vitals taken for this visit.    Patient lying in bed with intermittent coughing but overall appearing to breathe comfortably without respiratory distress.    Constitutional: [x]  Appears well-developed and well-nourished [x]  No apparent distress      []  Abnormal -     Mental status: [x]  Alert and awake  [x]  Oriented to person/place/time [x]  Able to follow commands    []  Abnormal -     Eyes:   EOM    [x]   Normal    []  Abnormal -   Sclera  [x]   Normal    []  Abnormal -          Discharge [x]   None visible   []  Abnormal -     HENT: [x]  Normocephalic, atraumatic  []  Abnormal -   [x]  Mouth/Throat: Mucous membranes are moist    External Ears [x]  Normal  []  Abnormal -    Neck: [x]  No visualized mass []  Abnormal -     Pulmonary/Chest: [x]  Respiratory effort normal   [x]  No visualized signs of  difficulty breathing or respiratory distress        []  Abnormal -      Musculoskeletal:   []  Normal gait with no signs of ataxia         [x]  Normal range of motion of neck        []  Abnormal -     Neurological:        [x]  No Facial Asymmetry (Cranial nerve 7 motor function) (limited exam due to video visit)          [x]  No gaze palsy        []  Abnormal -          Skin:        [x]  No significant exanthematous lesions or discoloration noted on facial skin         []  Abnormal -            Psychiatric:       [x]  Normal Affect []  Abnormal -        [x]  No Hallucinations    Other pertinent observable physical exam findings:-      Review of Data:  Labs:  No visits with results within 2 Day(s) from this visit.   Latest known visit with results is:   Hospital Outpatient Visit on 01/23/2021   Component Date Value Ref Range Status    Microalbumin,urine  random 01/23/2021 1.51  0 - 3.0 MG/DL Final    Creatinine, urine random 01/23/2021 130.00 (A)  30 - 125 mg/dL Final    Microalbumin/Creat ratio (mg/g cre* 01/23/2021 12  0 - 30 mg/g Final     Lab Results   Component Value Date/Time    WBC 9.5 01/12/2021 11:49 AM    HGB 13.6 01/12/2021 11:49 AM    HCT 41.7 01/12/2021 11:49 AM    PLATELET 244 01/12/2021 11:49 AM    MCV 94.3 01/12/2021 11:49 AM     Lab Results   Component Value Date/Time    Sodium 141 01/12/2021 11:49 AM    Potassium 4.2 01/12/2021 11:49 AM    Chloride 104 01/12/2021 11:49 AM    CO2 29 01/12/2021 11:49 AM    Anion gap 8 01/12/2021 11:49 AM    Glucose 203 (H) 01/12/2021 11:49 AM    BUN 12 01/12/2021 11:49 AM    Creatinine 1.01 01/12/2021 11:49 AM    BUN/Creatinine ratio 12 01/12/2021 11:49 AM    GFR est AA >60 01/12/2021 11:49 AM    GFR est non-AA 56 (L) 01/12/2021 11:49 AM    Calcium 9.5 01/12/2021 11:49 AM    Bilirubin, total 0.6 01/12/2021 11:49 AM    Alk. phosphatase 107 01/12/2021 11:49 AM    Protein, total 6.6 01/12/2021 11:49 AM    Albumin 3.5 01/12/2021 11:49 AM    Globulin 3.1 01/12/2021 11:49 AM    A-G  Ratio 1.1 01/12/2021 11:49 AM    ALT (SGPT) 25 01/12/2021 11:49 AM    AST (SGOT) 18 01/12/2021 11:49 AM     Lab Results   Component Value Date/Time    Cholesterol, total 130 01/12/2021 11:49 AM    HDL Cholesterol 42 01/12/2021 11:49 AM    LDL, calculated 52 01/12/2021 11:49 AM    VLDL, calculated 36 01/12/2021 11:49 AM    Triglyceride 180 (H) 01/12/2021 11:49 AM    CHOL/HDL Ratio 3.1 01/12/2021 11:49 AM     Lab Results   Component Value Date/Time    Hemoglobin A1c 7.3 (H) 01/12/2021 11:49 AM     Lab Results   Component Value Date/Time    Vitamin D 25-Hydroxy 54.7 01/12/2021 11:49 AM       Magnesium   Date Value Ref Range Status   01/12/2021 2.0 1.6 - 2.6 mg/dL Final   11/22/2020 2.0 1.6 - 2.6 mg/dL Final   08/10/2020 2.2 1.6 - 2.6 mg/dL Final   05/04/2020 2.2 1.6 - 2.6 mg/dL Final   06/17/2019 2.0 1.6 - 2.6 mg/dL Final             Health Maintenance:  Screening:    Mammogram: Right mammo/ultrasound negative (10/2020); bilateral due after 02/02/2021   PAP smear: well woman exam with NP Bjorn Loser (Pap/HPV negative on 02/15/2020)   Colorectal: colonoscopy (05/14/2018) normal.  Dr. Jacquiline Doe in [place].  Due 05/2028.   Depression: none   DM (HbA1c/FPG): HbA1c 7.3 (01/2021)   Hepatitis C: negative (03/2019)   Falls: none   DEXA: unknown   Glaucoma: regular eye exams with Dr. Faustino Congress (last 10/2020)   Smoking: none   Vitamin D: 54.7 (01/2021)   Medicare Wellness: 01/23/2021        Impression:  Patient Active Problem List   Diagnosis Code    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[place]) C91.01    S/P bone marrow transplant ([place]) Z94.81    Essential hypertension I10    Type 2 diabetes mellitus, with  long-term current use of insulin ([facility]) E11.9, Z79.4    Peripheral neuropathy due to chemotherapy ([facility]) G62.0, T45.1X5A    Mild intermittent asthma J45.20    GERD (gastroesophageal reflux disease) K21.9    Eczema L30.9    Insomnia G47.00    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    Hyperlipidemia E78.5    Elevated alkaline  phosphatase level R74.8    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  Acute cough due to bronchospasm.  Allergic rhinitis  Presented on 02/20/2021 with URI symptoms, cough, and sore throat developing after returning from a cruise. COVID-19 rapid antigen testing negative x 2.  Initially responded to symptomatic treatment with Tessalon and nasal spray but developed worsening cough productive of yellow sputum, right upper back pleuritic chest pain, and mild dyspnea, and was treated with azithromycin, prednisone, and Tessalon with improvement and return to baseline respiratory status.  Presents today after traveling to [place] last week and developed sore throat, worsening allergy symptoms, and increasing dyspnea, cough, and chest tightness/pressure.  Not responding to albuterol inhaler and does not appear to have evidence of infection.  Will treat acute asthma exacerbation with prednisone 40 mg daily for 5 days and given frequent recurrence, will also initiate Singulair 10 mg daily.  Advised to continue Xyzal, Flovent, and albuterol inhaler.  Will also attempt to obtain nebulizer for patient to help manage acute flares given frequency.  Will fax order to medical supply.  Will refill Tessalon for cough.  Advised to present to ED for any worsening dyspnea.      Other chronic issues:  1. Acute lymphoblastic leukemia, +[place] chromosone mutation, s/p stem cell transplant. Diagnosed in 02/2017 and underwent induction chemotherapy followed by stem cell transplant in 08/2017.  Had been on maintenance therapy with a TKI (nilotinib), but reports discontinued in 12/2020 by her oncologist. Being followed closely by Dr. Noemi Chapel, transplant oncologist, at [facility] in [place].  Reported frequency of follow-up had been decreased to annually  with next scheduled visit to [facility] in 12/2021.  CBC remains normal (01/2021).  Will continue to monitor.  2. Hypertension. Blood pressure remains well controlled on lisinopril 10 mg daily. Renal function has been normal with creatinine 1.01/ eGFR >60. On potassium and magnesium supplements due to long term difficulty with deficiency and levels have been stable. Will continue to monitor.  3. Hyperlipidemia.  On moderate intensity dose atorvastatin with LDL 52 and HDL 42 (01/2021), indicative of excellent control. Emphasized importance of lifestyle modifications, including heart healthy diet, regular exercise, and weight loss. Continue to follow.  4. Diabetes mellitus. Previously well controlled on regimen of Tresiba and SS Humalog with HbA1c 6.5 in 03/2019. However, increased to HbA1c 7.1 in 06/2019 and 9.2 in 04/2020 likely related to weight gain.  Now prescribed freestyle continuous glucose monitor to help with close monitoring of blood sugars.  Started on Ozempic in 04/2020 and continues on Tresiba at 34 units daily, sliding scale Humalog.  Repeat HbA1c improved to 7.3 in 01/2021 and dose of Ozempic increased to 2 mg weekly.  No evidence of microvascular complications. On statin and on lisinopril since 04/2020.  Continue regular eye exams with Dr. Faustino Congress.  Foot exam grossly normal (08/2019), and no evidence of microalbuminuria with urine microalbumin/ creatinine ratio 12 mg/g. Emphasized importance  of lifestyle modifications, including low carbohydrate diet, regular exercise, and weight loss.  Will reassess HbA1c in 3 months.  5. Peripheral neuropathy, painful. Thought to be secondary to chemotherapy. Did not tolerate gabapentin due to hallucinations. On Cymbalta 30 mg bid and Roxicodone through pain management.  However, weaned from [place] in 02/2020 and not wishing to restart.  Reported severe pain in feet at night with burning discomfort and cramping.  Started on pregabalin 75 mg twice daily and finding it  to be very effective.  Using Flexeril as needed for cramping. Continue to monitor.  6. Asthma, mild intermittent.  On Flovent for maintenance therapy and albuterol as needed.  Increasing difficulty today as discussed related to upper respiratory infection.  7. GERD. On Protonix 40 mg bid with reasonably good control. Follow.  8. Abnormal mammogram. Patient reports had previous biopsy of right breast which was benign. Screening mammogram (02/2019) showed multiple abnormalities in right breast. Right breast diagnostic mammogram and ultrasound (05/12/2019) showed probably benign findings, but recommendation was to proceed with 6 month follow-up.  Underwent repeat diagnostic right mammogram and right breast ultrasound in 10/2020 and findings felt to be benign.  Recommended continuing with annual follow-up and due for bilateral screening in 02/2021.  Urged to schedule.  Patient with FH history of breast cancer in her mother.  9. Elevated alkaline phosphatase. Unclear if related to transplant and bone marrow process.  Patient reports that has been elevated previously.  GGT elevated today at 97 so likely of hepatic origin. AMA negative (04/2020) and alkaline phosphatase isozymes with normal differential and predominant hepatic origin (04/2020).  Right upper quadrant ultrasound ordered but patient never obtained.  Repeat alkaline phosphatase normalized to 107 in 01/2021 and may be related to discontinuation of nilotinib.  Will reassess at next visit.  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  Reports increasing difficulty with vertigo and states has had multiple falls over the last few weeks.  Continuing difficulty with disequilibrium.  Referred to balance therapy after last visit in 01/2021, but has not scheduled.  Advised use of meclizine as needed.  Also prescribed scopolamine patch for use on cruise as history of motion sickness.  Will readdress at next visit.  11. Insomnia. Patient using Ambien +/- Benadryl as needed.  Emphasized good  sleep hygiene.  Continue to monitor.  12. Eczema. On Xyzal and will use Kenalog as needed.  Controlled today.  13. Left trigger thumb. Evaluated by Dr. Redmond Pulling in 04/2019 and received a cortisone injection with improvement.  Currently stable.  14.  Obesity. Weight had increased nearly 20 pounds since 12/2019.  Started treatment with Ozempic to help with weight loss and diabetes control as discussed, but weight had not changed.  Patient requested referral to dietitian to help with weight loss and scheduled for initial appointment later this month.  Emphasized importance of continuing with attempts lifestyle modifications, including heart healthy low carbohydrate diet, regular exercise, and weight loss.  15. Health maintenance. Completed 4 doses of the COVID 19 vaccine series due to immunosuppressed status and received the bivalent Pfizer booster dose.  Already received the influenza vaccine and Prevnar 20.  Advised to obtain the Shingrix vaccine. Other immunizations up-to-date.  Bilateral mammogram due in 02/2021 and order placed last visit but not yet scheduled. Obtained colonoscopy report from physicians in [place] and performed on 05/14/2018 to evaluate diarrhea s/p stem cell transplant and no lesions found.  Completed well woman exam with NP Rojelio Brenner and chart updated.  Also completed eye exam with Dr.  Iacobucci.  Vitamin D level has been normal on maintenance dose supplement. Emphasized importance of lifestyle modifications, including heart healthy diet, regular exercise, and weight loss. Medicare wellness visit up-to-date.     Patient understands recommendations and agrees with plan.  Follow-up as previously scheduled.      We discussed the expected course, resolution and complications of the diagnosis(es) in detail.  Medication risks, benefits, costs, interactions, and alternatives were discussed as indicated.  I advised her to contact the office if her condition worsens, changes or fails to improve as  anticipated. She expressed understanding with the diagnosis(es) and plan.     [name] Wu, was evaluated through a synchronous (real-time) audio-video encounter. The patient (or guardian if applicable) is aware that this is a billable service, which includes applicable co-pays. This Virtual Visit was conducted with patient's (and/or legal guardian's) consent. The visit was conducted pursuant to the emergency declaration under the [place], [place] waiver authority and the [employer] Act.  Patient identification was verified, and a caregiver was present when appropriate.  The patient was located at: Home: [address]  [place] [address]  The provider was located at: Facility (Appt Department): [facility] [id number]        Blanch Media, MD      Future orders:    ICD-10-CM ICD-9-CM    1. Mild intermittent asthma with acute exacerbation  J45.21 493.92 montelukast (SINGULAIR) 10 mg tablet      albuterol (PROVENTIL VENTOLIN) 2.5 mg /3 mL (0.083 %) nebu      Nebulizer Accessories kit      Nebulizer & Compressor machine      2. Cough due to bronchospasm  J98.01 519.11 predniSONE (DELTASONE) 20 mg tablet      benzonatate (TESSALON) 100 mg capsule      albuterol (PROVENTIL VENTOLIN) 2.5 mg /3 mL (0.083 %) nebu      3. Seasonal allergic rhinitis, unspecified trigger  J30.2 477.9 montelukast (SINGULAIR) 10 mg tablet      4. Type 2 diabetes mellitus with diabetic neuropathy, with long-term current use of insulin (HCC)  E11.40 250.60     Z79.4 357.2      V58.67       5. 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 S/P bone marrow transplant ([place])  Z94.81 V42.81       7. Class 1 obesity due to excess calories with serious comorbidity and body mass index (BMI) of 32.0 to 32.9 in adult  E66.09 278.00     Z68.32 V85.32

## 2021-03-27 MED ORDER — ATORVASTATIN 20 MG TAB
20 mg | ORAL_TABLET | ORAL | 3 refills | Status: AC
Start: 2021-03-27 — End: ?

## 2021-04-13 ENCOUNTER — Telehealth

## 2021-04-13 ENCOUNTER — Encounter

## 2021-04-13 MED ORDER — ALBUTEROL SULFATE HFA 90 MCG/ACTUATION AEROSOL INHALER
90 mcg/actuation | RESPIRATORY_TRACT | 3 refills | Status: AC | PRN
Start: 2021-04-13 — End: ?

## 2021-04-13 MED ORDER — FLUTICASONE 220 MCG/ACTUATION AEROSOL INHALER
220 mcg/actuation | RESPIRATORY_TRACT | 11 refills | Status: AC
Start: 2021-04-13 — End: ?

## 2021-04-13 NOTE — Telephone Encounter (Signed)
I believe order was printed and faxed to [facility] after visit when [facility] did not have nebulizer. Please advise if this was received.

## 2021-04-13 NOTE — Telephone Encounter (Signed)
Called and spoke with patient.  Reports that her respiratory status is much improved and she did not request refills for azithromycin, Tessalon, prednisone, scopolamine patches, or Flexeril.    She does request refills of her maintenance inhaler and albuterol inhaler.  [facility] Flov[facility]

## 2021-04-13 NOTE — Telephone Encounter (Signed)
Patient called and states that she had a printed prescription for a nebulizer machine that [facility] to [facility] on [address]    They told her they do not carry nebulizers and that her pcp would need to contact a [place], but they kept her printed script.    Patient is calling to see what she needs to do to get the machine. Patient can be reached at [phone]. Please advise, thank you

## 2021-04-16 NOTE — Telephone Encounter (Signed)
[facility] asked for [place] to refax orders. The original order was sent for scanning. Can you reorder as ambulatory supply order and then I will refax it.

## 2021-04-16 NOTE — Telephone Encounter (Signed)
Order placed.  Please fax.

## 2021-04-16 NOTE — Telephone Encounter (Signed)
Re-faxed orders to [facility]

## 2021-04-16 NOTE — Addendum Note (Signed)
Addendum Note by Blanch Media, MD at 04/16/21 1147                Author: Blanch Media, MD  Service: --  Author Type: Physician       Filed: 04/16/21 1147  Encounter Date: 04/13/2021  Status: Signed          Editor: Blanch Media, MD (Physician)          Addended by: Blanch Media on: 04/16/2021 11:47 AM    Modules accepted: Orders

## 2021-05-01 ENCOUNTER — Ambulatory Visit: Payer: MEDICARE | Primary: Internal Medicine

## 2021-05-01 MED ORDER — CYCLOBENZAPRINE 10 MG TAB
10 mg | ORAL_TABLET | ORAL | 1 refills | Status: DC
Start: 2021-05-01 — End: 2021-06-02

## 2021-05-04 ENCOUNTER — Ambulatory Visit: Payer: MEDICARE | Primary: Internal Medicine

## 2021-05-08 ENCOUNTER — Ambulatory Visit: Payer: MEDICARE | Attending: Internal Medicine | Primary: Internal Medicine

## 2021-05-17 ENCOUNTER — Encounter

## 2021-05-17 MED ORDER — DULOXETINE 30 MG CAP, DELAYED RELEASE
30 mg | ORAL_CAPSULE | ORAL | 3 refills | Status: AC
Start: 2021-05-17 — End: ?

## 2021-05-17 MED ORDER — MONTELUKAST 10 MG TAB
10 mg | ORAL_TABLET | Freq: Every day | ORAL | 3 refills | Status: AC
Start: 2021-05-17 — End: ?

## 2021-05-17 MED ORDER — PREGABALIN 75 MG CAP
75 mg | ORAL_CAPSULE | Freq: Two times a day (BID) | ORAL | 1 refills | Status: DC
Start: 2021-05-17 — End: 2021-05-30

## 2021-05-17 MED ORDER — TRULICITY 3 MG/0.5 ML SUBCUTANEOUS PEN INJECTOR
3 mg/0.5 mL | SUBCUTANEOUS | 3 refills | Status: AC
Start: 2021-05-17 — End: ?

## 2021-05-17 MED ORDER — ALBUTEROL SULFATE 0.083 % (0.83 MG/ML) SOLN FOR INHALATION
2.5 mg /3 mL (0.083 %) | RESPIRATORY_TRACT | 1 refills | Status: AC | PRN
Start: 2021-05-17 — End: ?

## 2021-05-17 NOTE — Telephone Encounter (Signed)
Fax received from pharmacy asking for refills to be sent to [facility].  LAST FILL 03/21/21 to the local pharmacy

## 2021-05-17 NOTE — Telephone Encounter (Signed)
Reviewed report generated by the [facility]. Does not demonstrate aberrancies or inconsistencies with regard to the prescribing of controlled medications to this patient by other providers.    Last filled 04/13/2021 per PMP.     Last UDS 12/28/2019. Will repeat at next lab appointment. Order placed.    Will need to sign an updated controlled substance agreement at next visit.       Patient no showed for her last appointment.  Please ask her to schedule

## 2021-05-18 NOTE — Telephone Encounter (Signed)
Patient no showed for her last appointment.  Please ask her to schedule

## 2021-05-18 NOTE — Telephone Encounter (Signed)
Patient called and states that the pharmacy told her the Trulicty has been out of stock for a while , and she can't find anywhere that has it.    She is calling to see if there is something else that can be prescribed instead?  Please advise, thank you

## 2021-05-18 NOTE — Telephone Encounter (Signed)
LMTRC

## 2021-05-22 NOTE — Telephone Encounter (Signed)
Please advise patient that all of the medications similar to Trulicity are on backorder from the manufacturer due to high demand.  Please advise that shipments are being released in a rolling fashion with different pharmacies receiving shipments from different companies.  There seems to be a large demand in [place] for these medications, but pharmacies in [place] and [place] seem to be better supplied.  Please ask that she attempt to call different pharmacies to see if Trulicity or Ozempic are available.  Her current dose of Trulicity is 3 mg every 7 days which would be equivalent to Ozempic 1 mg every 7 days.  If she is able to find a pharmacy that stocks either of these medications, a new prescription can be sent.

## 2021-05-23 NOTE — Telephone Encounter (Signed)
Patient already received refill from her mail order pharmacy.

## 2021-05-29 ENCOUNTER — Ambulatory Visit: Admit: 2021-05-29 | Discharge: 2021-05-29 | Payer: MEDICARE | Primary: Internal Medicine

## 2021-05-29 DIAGNOSIS — C9101 Acute lymphoblastic leukemia, in remission: Secondary | ICD-10-CM

## 2021-05-30 ENCOUNTER — Telehealth: Attending: Internal Medicine | Primary: Internal Medicine

## 2021-05-30 ENCOUNTER — Inpatient Hospital Stay: Admit: 2021-05-30 | Payer: MEDICARE | Primary: Internal Medicine

## 2021-05-30 ENCOUNTER — Telehealth: Admit: 2021-05-30 | Discharge: 2021-05-30 | Payer: MEDICARE | Attending: Internal Medicine | Primary: Internal Medicine

## 2021-05-30 DIAGNOSIS — G620 Drug-induced polyneuropathy: Secondary | ICD-10-CM

## 2021-05-30 DIAGNOSIS — R2689 Other abnormalities of gait and mobility: Secondary | ICD-10-CM

## 2021-05-30 DIAGNOSIS — E1140 Type 2 diabetes mellitus with diabetic neuropathy, unspecified: Secondary | ICD-10-CM

## 2021-05-30 LAB — CBC WITH AUTOMATED DIFF
ABS. BASOPHILS: 0 10*3/uL (ref 0.0–0.1)
ABS. EOSINOPHILS: 0.1 10*3/uL (ref 0.0–0.4)
ABS. IMM. GRANS.: 0 10*3/uL (ref 0.00–0.04)
ABS. LYMPHOCYTES: 1.9 10*3/uL (ref 0.9–3.6)
ABS. MONOCYTES: 0.5 10*3/uL (ref 0.05–1.2)
ABS. NEUTROPHILS: 5.3 10*3/uL (ref 1.8–8.0)
ABSOLUTE NRBC: 0 10*3/uL (ref 0.00–0.01)
BASOPHILS: 0 % (ref 0–2)
EOSINOPHILS: 1 % (ref 0–5)
HCT: 41.6 % (ref 35.0–45.0)
HGB: 13.2 g/dL (ref 12.0–16.0)
IMMATURE GRANULOCYTES: 0 % (ref 0.0–0.5)
LYMPHOCYTES: 25 % (ref 21–52)
MCH: 30.6 pg (ref 24.0–34.0)
MCHC: 31.7 g/dL (ref 31.0–37.0)
MCV: 96.5 fL (ref 78.0–100.0)
MONOCYTES: 6 % (ref 3–10)
MPV: 10.6 fL (ref 9.2–11.8)
NEUTROPHILS: 67 % (ref 40–73)
NRBC: 0 /100{WBCs}
PLATELET: 220 10*3/uL (ref 135–420)
RBC: 4.31 M/uL (ref 4.20–5.30)
RDW: 14.3 % (ref 11.6–14.5)
WBC: 7.8 10*3/uL (ref 4.6–13.2)

## 2021-05-30 LAB — METABOLIC PANEL, COMPREHENSIVE
A-G Ratio: 1.3 (ref 0.8–1.7)
ALT (SGPT): 26 U/L (ref 13–56)
AST (SGOT): 21 U/L (ref 10–38)
Albumin: 3.6 g/dL (ref 3.4–5.0)
Alk. phosphatase: 102 U/L (ref 45–117)
Anion gap: 4 mmol/L (ref 3.0–18)
BUN/Creatinine ratio: 11 — ABNORMAL LOW (ref 12–20)
BUN: 12 mg/dL (ref 7.0–18)
Bilirubin, total: 0.5 mg/dL (ref 0.2–1.0)
CO2: 30 mmol/L (ref 21–32)
Calcium: 9.2 mg/dL (ref 8.5–10.1)
Chloride: 105 mmol/L (ref 100–111)
Creatinine: 1.11 mg/dL (ref 0.6–1.3)
Globulin: 2.7 g/dL (ref 2.0–4.0)
Glucose: 177 mg/dL — ABNORMAL HIGH (ref 74–99)
Potassium: 4.3 mmol/L (ref 3.5–5.5)
Protein, total: 6.3 g/dL — ABNORMAL LOW (ref 6.4–8.2)
Sodium: 139 mmol/L (ref 136–145)
eGFR: 57 mL/min/{1.73_m2} — ABNORMAL LOW (ref >60)

## 2021-05-30 LAB — LIPID PANEL
CHOL/HDL Ratio: 3 (ref 0–5.0)
Chol/HDL Ratio: 3 (ref 0–5.0)
Cholesterol, Total: 137 MG/DL (ref <200)
Cholesterol, total: 137 mg/dL (ref <200)
HDL Cholesterol: 46 mg/dL (ref 40–60)
HDL: 46 MG/DL (ref 40–60)
LDL Calculated: 58.8 MG/DL (ref 0–100)
LDL, calculated: 58.8 mg/dL (ref 0–100)
Triglyceride: 161 MG/DL — ABNORMAL HIGH (ref <150)
Triglycerides: 161 MG/DL — ABNORMAL HIGH (ref <150)
VLDL Cholesterol Calculated: 32.2 MG/DL
VLDL, calculated: 32.2 mg/dL

## 2021-05-30 LAB — VITAMIN D, 25 HYDROXY: Vitamin D 25-Hydroxy: 48.9 ng/mL (ref 30–100)

## 2021-05-30 LAB — HEMOGLOBIN A1C WITH EAG
Est. average glucose: 177 mg/dL
Hemoglobin A1c: 7.8 % — ABNORMAL HIGH (ref 4.2–5.6)

## 2021-05-30 LAB — MAGNESIUM
Magnesium: 2 mg/dL (ref 1.6–2.6)
Magnesium: 2 mg/dL (ref 1.6–2.6)

## 2021-05-30 LAB — COMPREHENSIVE METABOLIC PANEL
ALT: 26 U/L (ref 13–56)
AST: 21 U/L (ref 10–38)
Albumin/Globulin Ratio: 1.3 (ref 0.8–1.7)
Albumin: 3.6 g/dL (ref 3.4–5.0)
Alkaline Phosphatase: 102 U/L (ref 45–117)
Anion Gap: 4 mmol/L (ref 3.0–18)
BUN/Creatinine Ratio: 11 — ABNORMAL LOW (ref 12–20)
BUN: 12 MG/DL (ref 7.0–18)
CO2: 30 mmol/L (ref 21–32)
Calcium: 9.2 MG/DL (ref 8.5–10.1)
Chloride: 105 mmol/L (ref 100–111)
Creatinine: 1.11 MG/DL (ref 0.6–1.3)
Est, Glom Filt Rate: 57 mL/min/{1.73_m2} — ABNORMAL LOW (ref >60)
Globulin: 2.7 g/dL (ref 2.0–4.0)
Glucose: 177 mg/dL — ABNORMAL HIGH (ref 74–99)
Potassium: 4.3 mmol/L (ref 3.5–5.5)
Sodium: 139 mmol/L (ref 136–145)
Total Bilirubin: 0.5 MG/DL (ref 0.2–1.0)
Total Protein: 6.3 g/dL — ABNORMAL LOW (ref 6.4–8.2)

## 2021-05-30 LAB — CBC WITH AUTO DIFFERENTIAL
Basophils %: 0 % (ref 0–2)
Basophils Absolute: 0 10*3/uL (ref 0.0–0.1)
Eosinophils %: 1 % (ref 0–5)
Eosinophils Absolute: 0.1 10*3/uL (ref 0.0–0.4)
Granulocyte Absolute Count: 0 10*3/uL (ref 0.00–0.04)
Hematocrit: 41.6 % (ref 35.0–45.0)
Hemoglobin: 13.2 g/dL (ref 12.0–16.0)
Immature Granulocytes %: 0 % (ref 0.0–0.5)
Lymphocytes %: 25 % (ref 21–52)
Lymphocytes Absolute: 1.9 10*3/uL (ref 0.9–3.6)
MCH: 30.6 PG (ref 24.0–34.0)
MCHC: 31.7 g/dL (ref 31.0–37.0)
MCV: 96.5 FL (ref 78.0–100.0)
MPV: 10.6 FL (ref 9.2–11.8)
Monocytes %: 6 % (ref 3–10)
Monocytes Absolute: 0.5 10*3/uL (ref 0.05–1.2)
NRBC Absolute: 0 10*3/uL (ref 0.00–0.01)
Neutrophils %: 67 % (ref 40–73)
Neutrophils Absolute: 5.3 10*3/uL (ref 1.8–8.0)
Nucleated RBCs: 0 PER 100 WBC
Platelets: 220 10*3/uL (ref 135–420)
RBC: 4.31 M/uL (ref 4.20–5.30)
RDW: 14.3 % (ref 11.6–14.5)
WBC: 7.8 10*3/uL (ref 4.6–13.2)

## 2021-05-30 LAB — HEMOGLOBIN A1C W/EAG
Estimated Avg Glucose: 177 mg/dL
Hemoglobin A1C: 7.8 % — ABNORMAL HIGH (ref 4.2–5.6)

## 2021-05-30 LAB — VITAMIN D 25 HYDROXY: Vit D, 25-Hydroxy: 48.9 ng/mL (ref 30–100)

## 2021-05-30 MED ORDER — MECLIZINE 25 MG TAB
25 mg | ORAL_TABLET | Freq: Three times a day (TID) | ORAL | 1 refills | Status: AC | PRN
Start: 2021-05-30 — End: ?

## 2021-05-30 MED ORDER — PREGABALIN 150 MG CAP
150 mg | ORAL_CAPSULE | Freq: Two times a day (BID) | ORAL | 0 refills | Status: AC
Start: 2021-05-30 — End: ?

## 2021-05-30 NOTE — Telephone Encounter (Signed)
Please schedule patient for appointment with labs prior in 3 months.    Thank you.

## 2021-05-30 NOTE — Telephone Encounter (Signed)
LMTRC

## 2021-05-30 NOTE — Progress Notes (Signed)
[name] Wu is a 61 y.o. female who was seen by synchronous (real-time) audio-video technology on 05/30/2021 for No chief complaint on file.      HPI:   [name] Wu is a 61 y.o. year old female who presents today for a routine visit. 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 She is continuing to follow with the [facility] in [place].  She has completed the Moderna COVID-19 vaccine series and received two Pfizer booster doses and the updated bivalent booster dose.  She was seen for a virtual visits on 02/20/2021 and 03/21/2021 for acute exacerbation of her asthma related to viral URI.  She was treated with prednisone and in 03/2021, she was also started on Singulair 10 mg daily.  She states that she has noted significant improvement in her asthma symptoms since starting Singulair.  She continues to take Xyzal, Flovent, and use albuterol as needed.  She states that she has not received the nebulizer as ordered following her last visit.  She also reports increasing difficulty with bilateral foot pain at night despite treatment with pregabalin 75 mg twice daily and states that she will also take ibuprofen to help with her discomfort.  She acknowledges that she did not begin balance therapy for her imbalance and reports continuing to have difficulty with unsteadiness.  She is otherwise without new complaints.        Summary of prior hospitalizations and medical history:   She has a history of acute lymphoblastic leukemia, diagnosed in 02/2017 after experiencing severe fatigue and bone pain for several months. She was positive for the [place] chromosome mutation. She presented to the [facility] in [place] for care, and underwent chemotherapy which achieved remission. She subsequently underwent a stem cell  transplant on 08/14/2017, felt to be curative. She is now on maintenance therapy with a tyrosine kinase inhibitor, nilotimib 200 mg bid. She is followed by Dr. Noemi Chapel , transplant oncologist, and had been traveling to [facility] every three months for care. She had a follow-up visit with Dr. Vinnie Level in 12/2020 and treatment with nilotinib was discontinued and follow-up recommended on an annual basis.     He has a history of hypertension, treated with amlodipine. She has a history of hyperlipidemia, and was restarted on atorvastatin in 07/2019.  She also has a history of diabetes mellitus, currently being treated with Tresiba, Humalog, and Ozempic. She denies any polyuria, polydipsia, nocturia, or blurry vision, and has no history of retinopathy or nephropathy. 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 She was under the care of pain management and being treated with Cymbalta and Roxicodone as needed, but has noted a significant decrease in the need for narcotics since being started on pregabalin in 02/2020.    She has a history of asthma since childhood, and is being maintained on Flovent and albuterol as needed. She denies any cough or shortness of breath.     In 04/2018, she was admitted to [facility] from 12/4-12/10/2017 for a UTI and then subsequently readmitted from 12/6-12/17/2019 with MRSE bacteremia related to a PICC line and bibasilar pneumonia.     He has a history of GERD, treated with Protonix. She also underwent a screening colonoscopy on 05/14/2018 by Dr. Jacquiline Doe at the [facility] in [place] to evaluate  diarrhea post stem cell transplant.  No lesions were found. She denies any abdominal pain, nausea, vomiting, melena, hematochezia, or change in bowel movements.      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 No pertinent surgical history.  Current Outpatient Medications   Medication Sig    pregabalin (LYRICA) 150 mg capsule Take 1 Capsule by mouth two (2) times a day. Max Daily Amount: 300 mg. Indications: neuropathic pain    meclizine (ANTIVERT) 25 mg tablet Take 1 Tablet by mouth three (3) times daily as needed for Dizziness. Indications: sensation of spinning or whirling    cyclobenzaprine (FLEXERIL) 10 mg tablet TAKE 1 TABLET BY MOUTH THREE TIMES A DAY AS NEEDED FOR MUSCLE SPASMS    albuterol (PROVENTIL VENTOLIN) 2.5 mg /3 mL (0.083 %) nebu 3 ML BY NEBULIZATION ROUTE EVERY FOUR (4) HOURS AS NEEDED (WHEEZING). INDICATIONS: ASTHMA ATTACK    DULoxetine (CYMBALTA) 30 mg capsule TAKE 1 CAPSULE BY MOUTH  TWICE DAILY    montelukast (SINGULAIR) 10 mg tablet Take 1 Tablet by mouth daily. Indications: controller medication for asthma    dulaglutide (Trulicity) 3 RP/5.9 mL pnij 3 mg by SubCUTAneous route every seven (7) days. Indications: type 2 diabetes mellitus    fluticasone propionate (Flovent HFA) 220 mcg/actuation inhaler TAKE 2 PUFFS BY INHALATION TWO (2) TIMES A DAY. INDICATIONS: CONTROLLER MEDICATION FOR ASTHMA    albuterol (PROVENTIL HFA, VENTOLIN HFA, PROAIR HFA) 90 mcg/actuation inhaler Take 1 Puff by inhalation every four (4) hours as needed for Wheezing or Cough.    atorvastatin (LIPITOR) 20 mg tablet TAKE 1 TABLET BY MOUTH EVERY DAY    Nebulizer Accessories kit by Does Not Apply route every four (4) hours as needed for Cough (Shortness of breath).    Nebulizer & Compressor machine 1 Each by Does Not Apply route every four (4)  hours as needed for Wheezing or Shortness of Breath.    Omeprazole delayed release (PRILOSEC D/R) 20 mg tablet Take 1 Tablet by mouth daily.    potassium chloride SR (K-TAB) 20 mEq tablet TAKE 2 TABLETS BY MOUTH DAILY    lisinopriL (PRINIVIL, ZESTRIL) 10 mg tablet TAKE 1 TABLET BY MOUTH EVERY DAY    insulin lispro (HumaLOG Junior KwikPen U-100) 100 unit/mL inph CHECK BLOOD GLUCOSE PRIOR TO MEALS AND AT BEDTIME. TAKE ACCORDING TO SLIDING SCALE FOR BS  180-200 USE 2 UNITS, 201-22O USE 4 UNITS, 221-240 USE 5 UNITS, 241-280 USE 6 UNITS, 281-320 USE 7 UNITS, 321-400 USE 8 UNITS, GREATER THAN 400 CALL DOCTOR    Tresiba FlexTouch U-200 200 unit/mL (3 mL) inpn pen INJECT 32 UNITS  SUBCUTANEOUSLY DAILY    Nano Pen Needle 32 gauge x 5/32" ndle INJECT 4 TIMES A DAY WITH MEALS AND AT BEDTIME    flash glucose sensor (FreeStyle Libre 2 Sensor) kit Use as directed to check blood sugars at least 3 times daily.    flash glucose scanning reader (  FreeStyle Libre 2 Reader) misc Use as directed to check blood sugars at least 3 times daily.    ondansetron (ZOFRAN ODT) 4 mg disintegrating tablet Take 1 Tablet by mouth every eight (8) hours as needed for Nausea.    diphenhydrAMINE (BENADRYL) 25 mg capsule Take 25 mg by mouth every six (6) hours as needed.    ZINC PO Take  by mouth.    thiamine HCl (VITAMIN B-1 PO) Take  by mouth.    ascorbic acid (VITAMIN C PO) Take  by mouth.    BIOTIN PO Take 1 Tab by mouth daily.    calcium carb/vit D3/minerals (CALTRATE 600+D PLUS MINERALS PO) Take  by mouth daily.    OneTouch Ultra Blue Test Strip strip TEST 4 TIMES A DAY    triamcinolone acetonide (KENALOG) 0.1 % topical cream APPLY TO AFFECTED AREA TWICE A DAY AS NEEDED FOR RASH    levocetirizine dihydrochloride (XYZAL PO) Take  by mouth.    magnesium oxide (MAG-OX) 400 mg tablet Take 800 mg by mouth two (2) times a day.    prochlorperazine (COMPAZINE) 10 mg tablet Take 10 mg by mouth every four (4) hours as needed.    Magnesium Oxide-Mg AA Chelate  133 mg tab Take 1 Tab by mouth two (2) times a day.    cholecalciferol (VITAMIN D3) (5000 Units/125 mcg) tab tablet Take 5,000 Units by mouth daily.    acyclovir (ZOVIRAX) 400 mg tablet Take 800 mg by mouth two (2) times a day.    zolpidem (AMBIEN) 5 mg tablet Take 5 mg by mouth nightly.     No current facility-administered medications for this visit.     Allergies and Intolerances:   Allergies   Allergen Reactions    Pcn [Penicillins] Hives and Itching     Family History: She has no family history of colon cancer of leukemia. Mother had breast cancer at the age of 17.   Family History   Problem Relation Age of Onset    Breast Cancer Mother      Social History:   She  reports that she has never smoked. She has never used smokeless tobacco. She is divorced and has no children. She is a Optometrist, and working virtually due to the pandemic.     Social History     Substance and Sexual Activity   Alcohol Use No     Immunization History:  Immunization History   Administered Date(s) Administered    COVID-19, MODERNA BLUE border, Primary or Immunocompromised, (age 488y+), IM, 100 mcg/0.42m 06/16/2019, 07/14/2019    COVID-19, PFIZER Bivalent BOOSTER, (age 482y+), IM, 30 mcg/0.3 mL dose 01/29/2021    COVID-19, PFIZER PURPLE top, DILUTE for use, (age 485y+), IM, 313m/0.3mL 03/12/2020, 10/22/2020    Influenza Vaccine 02/27/2018, 02/23/2019, 03/01/2020    Influenza, FLUARIX, FLULAVAL, FLUZONE (age 64 27o+) AND AFLURIA, (age 40 82+), PF, 0.3m60m9/13/2022    Pneumococcal Conjugate PCV20, PF (Prevnar 20) 01/23/2021    Pneumococcal Polysaccharide (PPSV-23) 03/18/2019    Tdap 03/18/2019       Review of Systems:   As above included in HPI.  Otherwise 11 point review of systems negative including constitutional, skin, HENT, eyes, respiratory, cardiovascular, gastrointestinal, genitourinary, musculoskeletal, endocrine, hematologic, allergy, and neurologic.    Physical:   There were no vitals taken for this visit.    Patient lying in bed  with intermittent coughing but overall appearing to breathe comfortably without respiratory distress.    Constitutional: _0  Appears well-developed and  well-nourished _0  No apparent distress      _1  Abnormal -     Mental status: _2  Alert and awake  _3  Oriented to person/place/time _4  Able to follow commands    _5  Abnormal -     Eyes:   EOM    _6   Normal    _7  Abnormal -   Sclera  _8   Normal    _9  Abnormal -          Discharge _10   None visible   _11  Abnormal -     HENT: <QWIIGWVIJAZMLPXO>_3<\/NNNYVQDAHFFNOHCK>_12  Normocephalic, atraumatic  <EZMZPPCMRXEVLJXW>_2<\/UNCHHAMNSYJBHGZT>_31  Abnormal -   _14  Mouth/Throat: Mucous membranes are moist    External Ears _15  Normal  _16  Abnormal -    Neck: _17  No visualized mass _18  Abnormal -     Pulmonary/Chest: _19  Respiratory effort normal   _20  No visualized signs of difficulty breathing or respiratory distress        _21  Abnormal -      Musculoskeletal:   _22  Normal gait with no signs of ataxia         _23  Normal range of motion of neck        _24  Abnormal -     Neurological:        _25  No Facial Asymmetry (Cranial nerve 7 motor function) (limited exam due to video visit)          _26  No gaze palsy        _27  Abnormal -          Skin:        _28  No significant exanthematous lesions or discoloration noted on facial skin         _29  Abnormal -            Psychiatric:       _30  Normal Affect _31  Abnormal -        _32  No Hallucinations    Other pertinent observable physical exam findings:-      Review of Data:  Labs:  Hospital Outpatient Visit on 05/29/2021   Component Date Value Ref Range Status    WBC 05/29/2021 7.8  4.6 - 13.2 K/uL Final    RBC 05/29/2021 4.31  4.20 - 5.30 M/uL Final    HGB 05/29/2021 13.2  12.0 - 16.0 g/dL Final    HCT 05/29/2021 41.6  35.0 - 45.0 % Final    MCV 05/29/2021 96.5  78.0 - 100.0 FL Final    MCH 05/29/2021 30.6  24.0 - 34.0 PG Final    MCHC 05/29/2021 31.7  31.0 - 37.0 g/dL Final    RDW 05/29/2021 14.3  11.6 - 14.5 % Final    PLATELET 05/29/2021 220  135 - 420 K/uL Final    MPV 05/29/2021 10.6  9.2 - 11.8 FL Final    NRBC  05/29/2021 0.0  0 PER 100 WBC Final    ABSOLUTE NRBC 05/29/2021 0.00  0.00 - 0.01 K/uL Final    NEUTROPHILS 05/29/2021 67  40 - 73 % Final    LYMPHOCYTES 05/29/2021 25  21 - 52 % Final    MONOCYTES 05/29/2021 6  3 - 10 % Final    EOSINOPHILS 05/29/2021 1  0 - 5 % Final    BASOPHILS 05/29/2021 0  0 - 2 % Final    IMMATURE GRANULOCYTES 05/29/2021 0  0.0 - 0.5 % Final    ABS. NEUTROPHILS 05/29/2021 5.3  1.8 - 8.0 K/UL Final    ABS. LYMPHOCYTES 05/29/2021 1.9  0.9 - 3.6 K/UL Final    ABS. MONOCYTES 05/29/2021 0.5  0.05 - 1.2 K/UL Final    ABS. EOSINOPHILS 05/29/2021 0.1  0.0 - 0.4 K/UL Final    ABS. BASOPHILS 05/29/2021 0.0  0.0 - 0.1 K/UL Final    ABS. IMM. GRANS. 05/29/2021 0.0  0.00 - 0.04 K/UL Final    DF 05/29/2021 AUTOMATED    Final    Hemoglobin A1c 05/29/2021 7.8 (A)  4.2 - 5.6 % Final    Est. average glucose 05/29/2021 177  mg/dL Final    LIPID PROFILE 05/29/2021        Final    Cholesterol, total 05/29/2021 137  <200 MG/DL Final    Triglyceride 05/29/2021 161 (A)  <150 MG/DL Final    HDL Cholesterol 05/29/2021 46  40 - 60 MG/DL Final    LDL, calculated 05/29/2021 58.8  0 - 100 MG/DL Final    VLDL, calculated 05/29/2021 32.2  MG/DL Final    CHOL/HDL Ratio 05/29/2021 3.0  0 - 5.0   Final    Magnesium 05/29/2021 2.0  1.6 - 2.6 mg/dL Final    Sodium 05/29/2021 139  136 - 145 mmol/L Final    Potassium 05/29/2021 4.3  3.5 - 5.5 mmol/L Final    Chloride 05/29/2021 105  100 - 111 mmol/L Final    CO2 05/29/2021 30  21 - 32 mmol/L Final    Anion gap 05/29/2021 4  3.0 - 18 mmol/L Final    Glucose 05/29/2021 177 (A)  74 - 99 mg/dL Final    BUN 05/29/2021 12  7.0 - 18 MG/DL Final    Creatinine 05/29/2021 1.11  0.6 - 1.3 MG/DL Final    BUN/Creatinine ratio 05/29/2021 11 (A)  12 - 20   Final    eGFR 05/29/2021 57 (A)  >60 ml/min/1.38m Final    Calcium 05/29/2021 9.2  8.5 - 10.1 MG/DL Final    Bilirubin, total 05/29/2021 0.5  0.2 - 1.0 MG/DL Final    ALT (SGPT) 05/29/2021 26  13 - 56 U/L Final    AST (SGOT) 05/29/2021 21  10  - 38 U/L Final    Alk. phosphatase 05/29/2021 102  45 - 117 U/L Final    Protein, total 05/29/2021 6.3 (A)  6.4 - 8.2 g/dL Final    Albumin 05/29/2021 3.6  3.4 - 5.0 g/dL Final    Globulin 05/29/2021 2.7  2.0 - 4.0 g/dL Final    A-G Ratio 05/29/2021 1.3  0.8 - 1.7   Final    Vitamin D 25-Hydroxy 05/29/2021 48.9  30 - 100 ng/mL Final         Health Maintenance:  Screening:    Mammogram: Right mammo/ultrasound negative (10/2020); bilateral due after 02/02/2021   PAP smear: well woman exam with NP MBjorn Loser(Pap/HPV negative on 02/15/2020)   Colorectal: colonoscopy (05/14/2018) normal.  Dr. [place]quiline Doein [place]  Due 05/2028.   Depression: none   DM (HbA1c/FPG): HbA1c 7.8 (05/2021)   Hepatitis C: negative (03/2019)   Falls: none   DEXA: unknown   Glaucoma: regular eye exams with Dr. IFaustino Congress(last 10/2020)   Smoking: none   Vitamin D: 48.9 (05/2021)   Medicare Wellness: 01/23/2021        Impression:  Patient Active Problem List   Diagnosis Code    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[facility] C91.01    S/P bone marrow transplant ([facility] Z94.81    Essential hypertension I10    Type 2 diabetes mellitus, with  long-term current use of insulin (HCC) E11.9, Z79.4    Peripheral neuropathy due to chemotherapy (HCC) G62.0, T45.1X5A    Mild intermittent asthma J45.20    GERD (gastroesophageal reflux disease) K21.9    Eczema L30.9    Insomnia G47.00    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    Hyperlipidemia E78.5    Elevated alkaline phosphatase level R74.8    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 Acute lymphoblastic leukemia, +[place] chromosone mutation, s/p stem cell transplant. Diagnosed in 02/2017 and underwent induction chemotherapy followed by stem cell transplant in 08/2017.  Had been on maintenance therapy with a TKI (nilotinib), but reports discontinued in 12/2020 by her oncologist. Being followed closely by  Dr. Noemi Chapel, transplant oncologist, at [facility] in [place].  Reported frequency of follow-up had been decreased to annually with [facility]  CBC remains normal today.  Will continue to monitor.  2. Hypertension. Blood pressure remains well controlled on lisinopril 10 mg daily. Renal function had been normal, but mildly decreased today with creatinine 1.11/ eGFR 57.  Advised to increase fluid intake and will reassess at next visit.  On potassium and magnesium supplements due to long term difficulty with deficiency and levels have been stable. Will continue to monitor.  3. Hyperlipidemia.  On moderate intensity dose atorvastatin with LDL 58 and HDL 46, indicative of excellent control. Emphasized importance of lifestyle modifications, including heart healthy diet, regular exercise, and weight loss. Continue to follow.  4. Diabetes mellitus. Previously well controlled on regimen of Tresiba and SS Humalog with HbA1c 6.5 in 03/2019. However, increased to HbA1c 7.1 in 06/2019 and 9.2 in 04/2020 likely related to weight gain.  Now prescribed Freestyle CGM to help with close monitoring of blood sugars.  Started on Ozempic in 04/2020 and continues on Tresiba at 33 units daily, and sliding scale Humalog.  Repeat HbA1c improved to 7.3 in 01/2021 and dose of Ozempic increased to 2 mg weekly.  However, changed to Trulicity 3 mg weekly due to availability issues.  Repeat HbA1c increased to 7.8 today and patient admits to dietary noncompliance.  Reports fasting blood sugars ranging 110-130 and advised to titrate Tresiba by 2 units every 2-3 days to achieve a fasting morning blood sugar of 90-100.  Now also seeing a dietitian and hoping to lose weight.  No evidence of microvascular complications, although mild decrease in renal function noted today with creatinine 1.11/eGFR 57. On statin and on lisinopril since 04/2020.  Continue regular eye exams with Dr. Faustino Congress.   Foot exam grossly normal (08/2019), and no evidence of microalbuminuria with urine microalbumin/ creatinine ratio 12 mg/g (01/2021). Emphasized importance of lifestyle modifications, including low carbohydrate diet, regular exercise, and weight loss.  Will reassess HbA1c in 3 months.  5. Peripheral neuropathy, painful. Thought to be secondary to chemotherapy. Did not tolerate gabapentin due to hallucinations. On Cymbalta 30 mg bid and was receiving Roxicodone through pain management.  However, weaned from [place] in 02/2020 and not wishing to restart.  Reported severe pain in feet at night with burning discomfort and cramping.  Started on pregabalin 75 mg twice daily in 02/2021 CGM and finding it to be very effective.  However, reporting some increased symptoms at night requiring use of ibuprofen.  Agreeable to increase dose of pregabalin to  150 mg twice daily.  Advised to continue using Flexeril as needed for cramping. Continue to monitor.  6. Asthma, mild intermittent.  Noting significant improvement since initiation of Singulair 10 mg daily following recent exacerbation in 03/2021.  Also on Flovent for maintenance therapy and albuterol as needed.  Patient reports that she has not yet received nebulizer and will provide with information for medical supply so that she may obtain.  Will readdress at next visit.  7. GERD. On Protonix 40 mg bid with reasonably good control. Follow.  8. Abnormal mammogram. Patient reports had previous biopsy of right breast which was benign. Screening mammogram (02/2019) showed multiple abnormalities in right breast. Right breast diagnostic mammogram and ultrasound (05/12/2019) showed probably benign findings, but recommendation was to proceed with 6 month follow-up.  Underwent repeat diagnostic right mammogram and right breast ultrasound in 10/2020 and findings felt to be benign.  Recommended continuing with annual follow-up and due for bilateral screening in 02/2021.  Order again  placed today and urged to schedule.  Patient with FH history of breast cancer in her mother.  9. Elevated alkaline phosphatase. GGT elevated today at 97 so likely of hepatic origin. AMA negative (04/2020) and alkaline phosphatase isozymes with normal differential and predominant hepatic origin (04/2020).  Right upper quadrant ultrasound ordered but patient never obtained. Repeat alkaline phosphatase normalized to 107 in 01/2021 and remains normal today.  Suspect normalization related to discontinuation of nilotinib.  Will continue to monitor.  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  Reported increasing difficulty with vertigo and stated that she had experienced multiple falls.  Continuing difficulty with disequilibrium.  Referred to balance therapy in 01/2021 but never scheduled.  Addressed today and requesting new referral be placed as still having difficulty.  Urged to schedule.  Advised use of meclizine as needed.   Will readdress at next visit.  11. Insomnia. Using Ambien +/- Benadryl as needed.  Emphasized good sleep hygiene.  Continue to monitor.  12. Eczema. On Xyzal and will use Kenalog as needed.  Controlled today.  13. Left trigger thumb. Evaluated by Dr. Redmond Pulling in 04/2019 and received a cortisone injection with improvement.  Currently stable.  14.  Obesity. Weight had increased nearly 20 pounds since 12/2019.  Started treatment with Ozempic to help with weight loss and diabetes control as discussed, but weight has not changed.  Patient reports that she has now seen a dietitian who has been helpful for weight loss with her mother and sister, and is planning to follow recommendations.  Emphasized importance of continuing with attempts lifestyle modifications, including heart healthy low carbohydrate diet, regular exercise, and weight loss.  15. Health maintenance. Completed 4 doses of the COVID 19 vaccine series due to immunosuppressed status and the bivalent Pfizer booster dose.  Already received the influenza vaccine and  Prevnar 20.  Advised to obtain the Shingrix vaccine. Other immunizations up-to-date.  Bilateral mammogram due in 02/2021 and still has not yet scheduled.  Will again place order today..  Colonoscopy completed in [place] on 05/14/2018.  Normal so 10-year follow-up recommended.  Completed well woman exam with NP Rojelio Brenner and chart updated.  Also completed eye exam with Dr. Faustino Congress.  Vitamin D level remains normal on maintenance dose supplement. Emphasized importance of lifestyle modifications, including heart healthy diet, regular exercise, and weight loss. Medicare wellness visit up-to-date.     Patient understands recommendations and agrees with plan.  Follow-up in 3 months.      We discussed the expected course, resolution and complications  of the diagnosis(es) in detail.  Medication risks, benefits, costs, interactions, and alternatives were discussed as indicated.  I advised her to contact the office if her condition worsens, changes or fails to improve as anticipated. She expressed understanding with the diagnosis(es) and plan.     Blenda Bridegroom, was evaluated through a synchronous (real-time) audio-video encounter. The patient (or guardian if applicable) is aware that this is a billable service, which includes applicable co-pays. This Virtual Visit was conducted with patient's (and/or legal guardian's) consent. The visit was conducted pursuant to the emergency declaration under the [place], [place] waiver authority and the [employer] Act.  Patient identification was verified, and a caregiver was present when appropriate.  The patient was located at: Home: [address]  [place] [address]  The provider was located at: Facility (Appt Department): [address] [place] [address]        Blanch Media, MD        Future orders:    ICD-10-CM ICD-9-CM    1. Type 2 diabetes mellitus  with diabetic neuropathy, with long-term current use of insulin (HCC)  E11.40 250.60 HEMOGLOBIN A1C WITH EAG    Z79.4 357.2 MAGNESIUM     Z12.45 METABOLIC PANEL, COMPREHENSIVE      MICROALBUMIN, UR, RAND W/ MICROALB/CREAT RATIO      2. 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HCC)  C91.01 204.01 CBC WITH AUTOMATED DIFF      METABOLIC PANEL, COMPREHENSIVE      3. S/P bone marrow transplant ([place])  Z94.81 V42.81       4. Hyperlipidemia, unspecified hyperlipidemia type  Y09.9 833.8 METABOLIC PANEL, COMPREHENSIVE      LIPID PANEL      5. Mild intermittent asthma without complication  S50.53 976.73       6. Frequent falls  R29.6 V15.88 REFERRAL TO PHYSICAL THERAPY      7. Dysequilibrium  R42 780.4 REFERRAL TO PHYSICAL THERAPY      8. Imbalance  R26.89 781.2 REFERRAL TO PHYSICAL THERAPY      9. Painful diabetic neuropathy (HCC)  E11.40 250.60 pregabalin (LYRICA) 150 mg capsule     357.2 MAGNESIUM      10. Peripheral neuropathy due to chemotherapy (HCC)  G62.0 357.7 pregabalin (LYRICA) 150 mg capsule    T45.1X5A E933.1 MAGNESIUM      11. Class 1 obesity due to excess calories with serious comorbidity and body mass index (BMI) of 32.0 to 32.9 in adult  E66.09 278.00     Z68.32 V85.32       12. Abnormal mammogram of right breast  R92.8 793.80 MAM MAMMO BI SCREENING INCL CAD      33. Encounter for screening mammogram for malignant neoplasm of breast  Z12.31 V76.12 MAM MAMMO BI SCREENING INCL CAD      41. Vitamin D deficiency, unspecified  E55.9 268.9 VITAMIN D, 25 HYDROXY

## 2021-06-02 ENCOUNTER — Encounter

## 2021-06-02 MED ORDER — CYCLOBENZAPRINE 10 MG TAB
10 mg | ORAL_TABLET | ORAL | 1 refills | Status: AC
Start: 2021-06-02 — End: ?

## 2021-06-04 MED ORDER — LISINOPRIL 10 MG TAB
10 mg | ORAL_TABLET | Freq: Every day | ORAL | 1 refills | Status: AC
Start: 2021-06-04 — End: ?

## 2021-06-04 NOTE — Telephone Encounter (Signed)
PCP: Blanch Media, MD    Last appt: 05/30/2021  No future appointments.    Requested Prescriptions     Pending Prescriptions Disp Refills    lisinopriL (PRINIVIL, ZESTRIL) 10 mg tablet 90 Tablet 1     Sig: Take 1 Tablet by mouth daily.

## 2021-06-17 ENCOUNTER — Encounter

## 2021-06-19 ENCOUNTER — Encounter

## 2021-06-24 ENCOUNTER — Encounter

## 2021-06-27 MED ORDER — MECLIZINE HCL 25 MG PO TABS
25 MG | ORAL_TABLET | ORAL | 1 refills | Status: DC
Start: 2021-06-27 — End: 2021-08-20

## 2021-06-27 MED ORDER — CYCLOBENZAPRINE HCL 10 MG PO TABS
10 MG | ORAL_TABLET | ORAL | 1 refills | Status: DC
Start: 2021-06-27 — End: 2021-08-20

## 2021-07-26 MED ORDER — TRULICITY 3 MG/0.5ML SC SOPN
3 MG/0.5ML | SUBCUTANEOUS | 1 refills | Status: DC
Start: 2021-07-26 — End: 2021-09-18

## 2021-08-15 NOTE — Telephone Encounter (Signed)
Patient called and states that she has called around to a bunch of places and can't find anywhere that has Trulicity in stock.    She is calling to see what Dr Unknown Jim suggests she do?   Please advise, thank you

## 2021-08-16 NOTE — Telephone Encounter (Signed)
Please advise the patient that many of our patients are having supply issues with Trulicity and the other GLP-1 agonist medications such as Ozempic.  The only option is to call around and see if you can find it at a pharmacy. There seems to be more availability of the drug in [place] and  rather than in [place].  I also have one patient who was able to find it at [facility].  Another good option is mail-order since many patients seem to be obtaining it this way without difficulty.  Her insurance would be able to recommend the mail order company that they use.    Once she is able to locate the medication, I will be able to send a new prescription to the pharmacy.

## 2021-08-17 NOTE — Telephone Encounter (Signed)
Patient is aware of message below and verbalized understanding. No further questions or concerns from patient at this time.

## 2021-08-17 NOTE — Telephone Encounter (Signed)
LMTCB/Advised I would send a mychart message as well.

## 2021-08-20 MED ORDER — CYCLOBENZAPRINE HCL 10 MG PO TABS
10 MG | ORAL_TABLET | ORAL | 1 refills | Status: DC
Start: 2021-08-20 — End: 2021-09-18

## 2021-08-20 MED ORDER — MECLIZINE HCL 25 MG PO TABS
25 MG | ORAL_TABLET | ORAL | 1 refills | Status: DC
Start: 2021-08-20 — End: 2021-09-18

## 2021-08-20 NOTE — Telephone Encounter (Signed)
PCP: Blanch Media, MD    Last appt: '@[email or web address]'$ @  No future appointments.    Requested Prescriptions     Pending Prescriptions Disp Refills    cyclobenzaprine (FLEXERIL) 10 MG tablet [Pharmacy Med Name: CYCLOBENZAPRINE 10 MG TABLET] 30 tablet 1     Sig: TAKE 1 TABLET BY MOUTH THREE TIMES A DAY AS NEEDED FOR MUSCLE SPASMS    meclizine (ANTIVERT) 25 MG tablet [Pharmacy Med Name: MECLIZINE 25 MG TABLET] 30 tablet 1     Sig: TAKE 1 TABLET BY MOUTH THREE (3) TIMES DAILY AS NEEDED FOR DIZZINESS

## 2021-09-03 MED ORDER — POTASSIUM CHLORIDE ER 20 MEQ PO TBCR
20 MEQ | ORAL_TABLET | ORAL | 1 refills | Status: DC
Start: 2021-09-03 — End: 2021-09-18

## 2021-09-18 ENCOUNTER — Telehealth

## 2021-09-18 MED ORDER — POTASSIUM CHLORIDE ER 20 MEQ PO TBCR
20 MEQ | ORAL_TABLET | Freq: Every day | ORAL | 1 refills | Status: AC
Start: 2021-09-18 — End: ?

## 2021-09-18 MED ORDER — INSULIN PEN NEEDLE 32G X 4 MM MISC
Freq: Every day | 3 refills | Status: AC
Start: 2021-09-18 — End: ?

## 2021-09-18 MED ORDER — TRULICITY 3 MG/0.5ML SC SOPN
3 MG/0.5ML | SUBCUTANEOUS | 1 refills | Status: DC
Start: 2021-09-18 — End: 2021-11-06

## 2021-09-18 MED ORDER — OMEPRAZOLE MAGNESIUM 20 MG PO TBEC
20 MG | ORAL_TABLET | Freq: Every day | ORAL | 2 refills | Status: AC
Start: 2021-09-18 — End: 2021-10-31

## 2021-09-18 MED ORDER — CYCLOBENZAPRINE HCL 10 MG PO TABS
10 MG | ORAL_TABLET | ORAL | 1 refills | Status: AC
Start: 2021-09-18 — End: ?

## 2021-09-18 MED ORDER — MECLIZINE HCL 25 MG PO TABS
25 MG | ORAL_TABLET | ORAL | 1 refills | Status: AC
Start: 2021-09-18 — End: ?

## 2021-09-18 NOTE — Telephone Encounter (Signed)
Patient is calling in regards to the referral for PT at [facility]. She is just now trying to get scheduled and they cannot access the referral because of it being in the old system. She needs [place] to put in another referral.

## 2021-09-18 NOTE — Telephone Encounter (Signed)
Called [facility] they are not sure what the patient is talking about they have Singulair ready for pick up. Spoke with patient she needs refills. Will send new Rx's to the pharmacy to fill. Refills pended below.

## 2021-09-18 NOTE — Telephone Encounter (Signed)
Patient is calling stating she got a text from [facility] telling her that some of her medications are on hold. She is not able to tell me which meds or why they are on hold.

## 2021-09-18 NOTE — Telephone Encounter (Signed)
Referral reordered.

## 2021-10-29 MED ORDER — TRESIBA FLEXTOUCH 200 UNIT/ML SC SOPN
200 UNIT/ML | SUBCUTANEOUS | 1 refills | Status: AC
Start: 2021-10-29 — End: ?

## 2021-10-29 NOTE — Telephone Encounter (Signed)
Previous refill per chart    Tresiba FlexTouch U-200 200 unit/mL (3 mL) inpn pen 18 mL 3 09/27/2020     Sig: INJECT 32 UNITS  SUBCUTANEOUSLY DAILY    Sent to pharmacy as: Tyler Aas FlexTouch U-200 insulin 200 unit/mL (3 mL) subcutaneous pen (insulin degludec)    Notes to Pharmacy: Requesting 1 year supply    E-Prescribing Status: Receipt confirmed by pharmacy (09/27/2020  8:38 AM EDT)

## 2021-10-29 NOTE — Telephone Encounter (Signed)
Patient due for an appointment.  Please schedule with labs prior

## 2021-10-30 NOTE — Telephone Encounter (Signed)
Unable to lvm  Sent letter

## 2021-10-31 NOTE — Telephone Encounter (Signed)
Previous refill per chart    omeprazole (PRILOSEC OTC) 20 MG tablet 30 tablet 2 09/18/2021     Sig - Route: Take 1 tablet by mouth daily - Oral    Sent to pharmacy as: Omeprazole Magnesium 20 MG Oral Tablet Delayed Release (PRILOSEC OTC)    E-Prescribing Status: Receipt confirmed by pharmacy (09/18/2021  4:23 PM EDT)

## 2021-10-31 NOTE — Telephone Encounter (Signed)
Please get more information.  Please also inquire as to the reason she believes it is due to Trulicity and what she has been doing to manage the hives.

## 2021-10-31 NOTE — Telephone Encounter (Signed)
Patient called stating she have been breaking out in hives for going on 2 weeks she think its coming from trulicity but she is not sure Please Advise.

## 2021-10-31 NOTE — Telephone Encounter (Signed)
Previous refill per chart    lisinopriL (PRINIVIL, ZESTRIL) 10 mg tablet 90 Tablet 1 06/04/2021     Sig - Route: Take 1 Tablet by mouth daily. - Oral    Sent to pharmacy as: lisinopriL 10 mg tablet (PRINIVIL, ZESTRIL)    E-Prescribing Status: Receipt confirmed by pharmacy (06/04/2021  2:45 PM EST)

## 2021-11-01 MED ORDER — LISINOPRIL 10 MG PO TABS
10 MG | ORAL_TABLET | ORAL | 3 refills | Status: AC
Start: 2021-11-01 — End: 2022-06-26

## 2021-11-01 MED ORDER — OMEPRAZOLE MAGNESIUM 20 MG PO TBEC
20 MG | ORAL_TABLET | Freq: Every day | ORAL | 3 refills | Status: DC
Start: 2021-11-01 — End: 2021-12-04

## 2021-11-01 NOTE — Telephone Encounter (Signed)
Please see if she would like to change back to Ozempic? Would recommend skipping 1 week to make sure that rash completely resolved and then starting on Ozempic if she would like.

## 2021-11-01 NOTE — Telephone Encounter (Signed)
Called and spoke with the patient she reports she believes the hives is from the Trulicity because it happened within 24 hours of taking the medication. Patient reports having a similar experience with Victoza in the past. Patient has tolerated Ozepmic fine in the past. Patient has been taking allegra hives medication and it has not been helpful. Patient reports not being about to work for the last week or so due to the hives and discomfort. Patient was evaluated at [facility] yesterday and was given a steroid dose pack to take. She reports feeling slightly better now. Please advise recommendations for Trulicity moving forward.

## 2021-11-02 NOTE — Telephone Encounter (Signed)
LMTCB

## 2021-11-05 NOTE — Telephone Encounter (Signed)
Left message for patient to call the office.

## 2021-11-06 MED ORDER — SEMAGLUTIDE (2 MG/DOSE) 8 MG/3ML SC SOPN
83 MG/3ML | SUBCUTANEOUS | 3 refills | Status: AC
Start: 2021-11-06 — End: 2021-11-28

## 2021-11-06 MED ORDER — POTASSIUM CHLORIDE CRYS ER 20 MEQ PO TBCR
20 MEQ | ORAL_TABLET | Freq: Every day | ORAL | 3 refills | Status: AC
Start: 2021-11-06 — End: 2022-06-26

## 2021-11-06 NOTE — Telephone Encounter (Signed)
Patient states she will restart Ozempic once she finishes the steroids.     Patient states to send prescription to [facility].

## 2021-11-06 NOTE — Addendum Note (Signed)
Addended by: Rolla Flatten on: 11/06/2021 10:14 AM     Modules accepted: Orders

## 2021-11-06 NOTE — Addendum Note (Signed)
Addended by: Blanch Media on: 11/06/2021 01:11 PM     Modules accepted: Orders

## 2021-11-06 NOTE — Telephone Encounter (Signed)
Patient states she has not been able to get Potassium from local pharmacy.

## 2021-11-15 NOTE — Telephone Encounter (Signed)
Did not receive any paperwork from [facility] today.  Have not seen this form.  Please advise

## 2021-11-15 NOTE — Telephone Encounter (Signed)
Patient called in stating that [facility] faxed over paperwork that need to be sign off on by dr so patient can receive her omeprazole '20mg'$ .Please Advise      [facility] - [place], [place] - [phone]

## 2021-11-20 NOTE — Telephone Encounter (Signed)
LMTCB- we have not received this form. Need to find out form patient what this for was for?

## 2021-11-27 ENCOUNTER — Encounter

## 2021-11-27 ENCOUNTER — Telehealth: Admit: 2021-11-27 | Discharge: 2021-11-27 | Payer: MEDICARE | Attending: Internal Medicine | Primary: Internal Medicine

## 2021-11-27 DIAGNOSIS — L298 Other pruritus: Secondary | ICD-10-CM

## 2021-11-27 MED ORDER — PREGABALIN 150 MG PO CAPS
150 MG | ORAL_CAPSULE | Freq: Two times a day (BID) | ORAL | 0 refills | Status: DC
Start: 2021-11-27 — End: 2021-11-28

## 2021-11-27 MED ORDER — FAMOTIDINE 20 MG PO TABS
20 MG | ORAL_TABLET | Freq: Two times a day (BID) | ORAL | 0 refills | Status: DC
Start: 2021-11-27 — End: 2021-11-28

## 2021-11-27 MED ORDER — PREDNISONE 10 MG PO TABS
10 MG | ORAL_TABLET | ORAL | 0 refills | Status: DC
Start: 2021-11-27 — End: 2021-11-28

## 2021-11-27 NOTE — Progress Notes (Unsigned)
[name] Wu presents today for   Chief Complaint   Patient presents with    Skin Problem     Eczema- entire body 3 months                  1. "Have you been to the ER, urgent care clinic since your last visit?  Hospitalized since your last visit?" [facility] 1 month ago    2. "Have you seen or consulted any other health care providers outside of the [place] since your last visit?" no     3. For patients aged 61-75: Has the patient had a colonoscopy / FIT/ Cologuard? Yes - no Care Gap present      If the patient is female:    4. For patients aged 21-74: Has the patient had a mammogram within the past 2 years? Yes - no Care Gap present      5. For patients aged 21-65: Has the patient had a pap smear? Yes - no Care Gap present

## 2021-11-27 NOTE — Telephone Encounter (Signed)
Please schedule patient for appointment with labs prior in next 4 to 6 weeks.  Please let me know when scheduled so I can place lab orders..    Thank you.

## 2021-11-27 NOTE — Progress Notes (Unsigned)
11/27/2021    TELEHEALTH EVALUATION -- Audio/Visual    HPI:    [name] Wu (DOB:  [birth year]) has requested an audio/video evaluation for the following concern(s):  Pruritic rash.    HPI:   [name] Wu is a 61 y.o. year old female who presents today for an acute visit. 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 She is continuing to follow with the [facility] in [place].  She has completed the Moderna COVID-19 vaccine series and received two Pfizer booster doses and the updated bivalent booster dose.      She reports today that she has been having difficulty with an erythematous pruritic rash for approximately 8 weeks.  She presented to [facility] on 10/31/2021 and was prescribed prednisone which was tapered over approximately 2 weeks and completed approximately 2-3 weeks ago.  She states that the rash resolved while being treated with prednisone, but has now recurred for the last week.  She states that the pruritus is keeping her up at night, and she describes the rash as being "everywhere".  She does report a fine papular erythematous rash and reports it is most prominent on her face and extremities.  She continues to take Xyzal, Singulair, Benadryl, and states that she recently tried an over-the-counter "Allegra hives" with only minimal relief.  She denies any new detergent or soap, and has not been able to identify any foods that could be related to the rash.  She states that she believed that the rash initially developed due to treatment with Trulicity, but states that the rash recurred after completing treatment with prednisone and she is no longer taking Trulicity.  She denies any fever, chills, travel, or recent yard work.  She does report a history of eczema but states that the rash was never this extensive.    She was last seen  for a virtual visits on 02/20/2021 and 03/21/2021 for acute exacerbation of her asthma related to viral URI. She was treated with prednisone and in 03/2021, she was also started on Singulair 10 mg daily.  She states that she has noted significant improvement in her asthma symptoms since starting Singulair.  She continues to take Xyzal, Flovent, and use albuterol as needed.      She also reported increasing difficulty with bilateral foot pain at night despite treatment with pregabalin 75 mg twice daily and reported also taking ibuprofen to help with her discomfort.  Her dose of pregabalin was increased to 150 mg twice daily and she reports improvement today.  She is requesting a refill.    She is otherwise without new complaints.           Summary of prior hospitalizations and medical history:   She has a history of acute lymphoblastic leukemia, diagnosed in 02/2017 after experiencing severe fatigue and bone pain for several months. She was positive for the [place] chromosome mutation. She presented to the [facility] in [place] for care, and underwent chemotherapy which achieved remission. She subsequently underwent a stem cell transplant on 08/14/2017, felt to be curative. She is now on maintenance therapy with a tyrosine kinase inhibitor, nilotimib 200 mg bid. She is followed by Dr. Noemi Chapel , transplant oncologist, and had been traveling to [facility] every three months for care. She had a follow-up visit with Dr. Vinnie Level in 12/2020 and treatment with nilotinib was discontinued and  follow-up recommended on an annual basis.      He has a history of hypertension, treated with amlodipine. She has a history of hyperlipidemia, and was restarted on atorvastatin in 07/2019.  She also has a history of diabetes mellitus, currently being treated with Tresiba, Humalog, and Ozempic. She denies any polyuria, polydipsia, nocturia, or blurry vision, and has no history of retinopathy or nephropathy. 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 She was under the care of pain management and being treated with Cymbalta and Roxicodone as needed, but has noted a significant decrease in the need for narcotics since being started on pregabalin in 02/2020.     She has a history of asthma since childhood, and is being maintained on Flovent and albuterol as needed. She denies any cough or shortness of breath.      In 04/2018, she was admitted to [facility] from 12/4-12/10/2017 for a UTI and then subsequently readmitted from 12/6-12/17/2019 with MRSE bacteremia related to a PICC line and bibasilar pneumonia.      He has a history of GERD, treated with Protonix. She also underwent a screening colonoscopy on 05/14/2018 by Dr. Jacquiline Doe at the [facility] in [place] to evaluate diarrhea post stem cell transplant.  No lesions were found. She denies any abdominal pain, nausea, vomiting, melena, hematochezia, or change in bowel movements.       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  Current Outpatient Medications   Medication Sig    potassium chloride (KLOR-CON M) 20 MEQ extended release tablet Take 2 tablets by mouth daily    Semaglutide, 2 MG/DOSE, 8 MG/3ML SOPN Inject 2 mg into the skin once a week    lisinopril (PRINIVIL;ZESTRIL) 10 MG tablet TAKE 1 TABLET BY MOUTH DAILY    omeprazole (PRILOSEC OTC) 20 MG tablet Take 1 tablet by  mouth daily    TRESIBA FLEXTOUCH 200 UNIT/ML SOPN INJECT 32 UNITS  SUBCUTANEOUSLY DAILY    potassium chloride (KLOR-CON M) 20 MEQ TBCR extended release tablet Take 2 tablets by mouth daily    cyclobenzaprine (FLEXERIL) 10 MG tablet TAKE 1 TABLET BY MOUTH THREE TIMES A DAY AS NEEDED FOR MUSCLE SPASMS    meclizine (ANTIVERT) 25 MG tablet TAKE 1 TABLET BY MOUTH THREE (3) TIMES DAILY AS NEEDED FOR DIZZINESS    Insulin Pen Needle 32G X 4 MM MISC 1 each by Does not apply route daily    BIOTIN PO Take 1 tablet by mouth daily    ZINC PO Take by mouth    acyclovir (ZOVIRAX) 400 MG tablet Take 800 mg by mouth 2 times daily    albuterol sulfate HFA (PROVENTIL;VENTOLIN;PROAIR) 108 (90 Base) MCG/ACT inhaler Inhale 1 puff into the lungs every 4 hours as needed    albuterol (PROVENTIL) (2.5 MG/3ML) 0.083% nebulizer solution Inhale 2.5 mg  into the lungs every 4 hours as needed    atorvastatin (LIPITOR) 20 MG tablet TAKE 1 TABLET BY MOUTH EVERY DAY    vitamin D3 (CHOLECALCIFEROL) 125 MCG (5000 UT) TABS tablet Take 5,000 Units by mouth daily    diphenhydrAMINE (BENADRYL) 25 MG capsule Take 25 mg by mouth every 6 hours as needed    DULoxetine (CYMBALTA) 30 MG extended release capsule Take 30 mg by mouth 2 times daily    fluticasone (FLOVENT HFA) 220 MCG/ACT inhaler TAKE 2 PUFFS BY INHALATION TWO (2) TIMES A DAY. INDICATIONS: CONTROLLER MEDICATION FOR ASTHMA    insulin lispro, 0.5 Unit Dial, 100 UNIT/ML SOPN CHECK BLOOD GLUCOSE PRIOR TO MEALS AND AT BEDTIME. TAKE ACCORDING TO SLIDING SCALE FOR BS  180-200 USE 2 UNITS, 201-22O USE 4 UNITS, 221-240 USE 5 UNITS, 241-280 USE 6 UNITS, 281-320 USE 7 UNITS, 321-400 USE 8 UNITS, GREATER THAN 400 CALL DOCTOR    magnesium oxide (MAG-OX) 400 MG tablet Take 800 mg by mouth 2 times daily    montelukast (SINGULAIR) 10 MG tablet Take 10 mg by mouth daily    ondansetron (ZOFRAN-ODT) 4 MG disintegrating tablet Take 4 mg by mouth every 8 hours as needed    oxyCODONE (ROXICODONE) 5 MG immediate release  tablet TAKE 1 TABLET BY MOUTH EVERY 6 HOURS AS NEEDED FOR PAIN MANAGEMENT    pregabalin (LYRICA) 75 MG capsule Take 75 mg by mouth 2 times daily.    prochlorperazine (COMPAZINE) 10 MG tablet Take 10 mg by mouth every 4 hours as needed    scopolamine (TRANSDERM-SCOP) transdermal patch Place 1 patch onto the skin every 72 hours    Specialty Vitamins Products (MAGNESIUM, AMINO ACID CHELATE,) 133 MG tablet Take 1 tablet by mouth 2 times daily    triamcinolone (KENALOG) 0.1 % cream APPLY TO AFFECTED AREA TWICE A DAY AS NEEDED FOR RASH    zolpidem (AMBIEN) 5 MG tablet Take 5 mg by mouth.     No current facility-administered medications for this visit.     Allergies and Intolerances:   Allergies   Allergen Reactions    Penicillins Hives and Itching     Family History:  She has no family history of colon cancer of leukemia. Mother had breast cancer at the age of 69.   Family History   Problem Relation Age of Onset    Breast Cancer Mother      Social History:   She  reports that she has never smoked. She has never used smokeless tobacco. She is divorced and has no children. She is a Optometrist, and working virtually due to the pandemic.     Social History     Substance and Sexual Activity   Alcohol Use No     Immunization History:  Immunization History   Administered Date(s) Administered    COVID-19, MODERNA BLUE border, Primary or Immunocompromised, (age 202y+), IM, 100 mcg/0.33m 06/16/2019, 07/14/2019    COVID-19, MODERNA Bivalent, (age 202y+), IM, 50 mcg/0.5 mL 09/13/2021    COVID-19, PFIZER PURPLE top, DILUTE for use, (age 2088y+), 329m/0.3mL 03/12/2020    Influenza Trivalent 02/27/2018, 02/23/2019, 03/01/2020    Influenza, FLUARIX, FLULAVAL, FLUZONE (age 27 38o+) AND AFLURIA, (age 12 60+), PF, 0.71m20m9/13/2022    Pneumococcal, PCV20, PREVNAR 20,80age 208y+), IM, 0.71mL88m/13/2022    Pneumococcal, PPSV23, PNEUMOVAX 23, 45ge 2y+), SC/IM, 0.71mL 82m05/2020    TDaP, ADACEL (age 200y-639y-64yOSTRIX (age 200y+), IM, 0.71mL 110m5/2020     Zoster Recombinant (Shingrix) 09/13/2021  Review of Systems:   As above included in HPI.  Otherwise 11 point review of systems negative including constitutional, skin, HENT, eyes, respiratory, cardiovascular, gastrointestinal, genitourinary, musculoskeletal, endocrine, hematologic, allergy, and neurologic.    Physical:   There were no vitals filed for this visit.    Exam:   PHYSICAL EXAMINATION:  [ INSTRUCTIONS:  "'[x]' " Indicates a positive item  "'[]' " Indicates a negative item  -- DELETE ALL ITEMS NOT EXAMINED]  Vital Signs: (As obtained by patient/caregiver or practitioner observation)    Blood pressure-  Heart rate-    Respiratory rate-    Temperature-  Pulse oximetry-     Constitutional: '[]'  Appears well-developed and well-nourished '[]'  No apparent distress      '[]'  Abnormal-   Mental status  '[]'  Alert and awake  '[]'  Oriented to person/place/time '[]' Able to follow commands      Eyes:  EOM    '[]'   Normal  '[]'  Abnormal-  Sclera  '[]'   Normal  '[]'  Abnormal -         Discharge '[]'   None visible  '[]'  Abnormal -    HENT:   '[]'  Normocephalic, atraumatic.  '[]'  Abnormal   '[]'  Mouth/Throat: Mucous membranes are moist.     External Ears '[]'  Normal  '[]'  Abnormal-     Neck: '[]'  No visualized mass     Pulmonary/Chest: '[]'  Respiratory effort normal.  '[]'  No visualized signs of difficulty breathing or respiratory distress        '[]'  Abnormal-      Musculoskeletal:   '[]'  Normal gait with no signs of ataxia         '[]'  Normal range of motion of neck        '[]'  Abnormal-       Neurological:        '[]'  No Facial Asymmetry (Cranial nerve 7 motor function) (limited exam to video visit)          '[]'  No gaze palsy        '[]'  Abnormal-         Skin:        '[]'  No significant exanthematous lesions or discoloration noted on facial skin         '[]'  Abnormal-            Psychiatric:       '[]'  Normal Affect '[]'  No Hallucinations        '[]'  Abnormal-     Other pertinent observable physical exam findings-       Review of Data:  Labs:  No visits with results within 1  Month(s) from this visit.   Latest known visit with results is:   Orders Only on 01/23/2021   Component Date Value Ref Range Status    Microalb, Ur 01/23/2021 1.51  0 - 3.0 MG/DL Final    Creatinine, Ur 01/23/2021 130.00 (H)  30 - 125 mg/dL Final    Microalbumin Creatinine Ratio 01/23/2021 12  0 - 30 mg/g Final          Health Maintenance:  Screening:               Mammogram: Right mammo/ultrasound negative (10/2020); bilateral due after 02/02/2021              PAP smear: well woman exam with NP Bjorn Loser (Pap/HPV negative on 02/15/2020)              Colorectal: colonoscopy (05/14/2018) normal.  Dr. Jacquiline Doe in [place].  Due  05/2028.              Depression: none              DM (HbA1c/FPG): HbA1c 7.8 (05/2021)              Hepatitis C: negative (03/2019)              Falls: none              DEXA: unknown              Glaucoma: regular eye exams with Dr. Faustino Congress (last 10/2020)              Smoking: none              Vitamin D: 48.9 (05/2021)              Medicare Wellness: 01/23/2021           Impression:  Patient Active Problem List   Diagnosis    Peripheral neuropathy due to chemotherapy ([place])    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[place] )    Essential hypertension    Type 2 diabetes mellitus, with long-term current use of insulin ([facility])    S/P bone marrow transplant ([place])    Eczema    Elevated alkaline phosphatase level    Primary osteoarthritis involving multiple joints    Insomnia    GERD (gastroesophageal reflux disease)    Vestibular neuronitis    Hyperlipidemia    Class 1 obesity due to excess calories with serious comorbidity in adult    Imbalance    Mild intermittent asthma       Plan:          Chronic issues:  1. Acute lymphoblastic leukemia, +[place] chromosone mutation, s/p stem cell transplant. Diagnosed in 02/2017 and underwent induction chemotherapy followed by stem cell transplant in 08/2017.  Had been on maintenance therapy with a TKI (nilotinib), but reports discontinued in 12/2020 by her oncologist.  Being followed closely by Dr. Noemi Chapel, transplant oncologist, at [facility] in [place].  Reported frequency of follow-up had been decreased to annually with next scheduled visit to [facility] in 12/2021.  CBC remains normal today.  Will continue to monitor.  2. Hypertension. Blood pressure remains well controlled on lisinopril 10 mg daily. Renal function had been normal, but mildly decreased today with creatinine 1.11/ eGFR 57.  Advised to increase fluid intake and will reassess at next visit.  On potassium and magnesium supplements due to long term difficulty with deficiency and levels have been stable. Will continue to monitor.  3. Hyperlipidemia.  On moderate intensity dose atorvastatin with LDL 58 and HDL 46, indicative of excellent control. Emphasized importance of lifestyle modifications, including heart healthy diet, regular exercise, and weight loss. Continue to follow.  4. Diabetes mellitus. Previously well controlled on regimen of Tresiba and SS Humalog with HbA1c 6.5 in 03/2019. However, increased to HbA1c 7.1 in 06/2019 and 9.2 in 04/2020 likely related to weight gain.  Now prescribed Freestyle CGM to help with close monitoring of blood sugars.  Started on Ozempic in 04/2020 and continues on Tresiba at 33 units daily, and sliding scale Humalog.  Repeat HbA1c improved to 7.3 in 01/2021 and dose of Ozempic increased to 2 mg weekly.  However, changed to Trulicity 3 mg weekly due to availability issues.  Repeat HbA1c increased to 7.8 today and patient admits to dietary noncompliance.  Reports fasting blood sugars ranging 110-130  and advised to titrate [place] by 2 units every 2-3 days to achieve a fasting morning blood sugar of 90-100.  Now also seeing a dietitian and hoping to lose weight.  No evidence of microvascular complications, although mild decrease in renal function noted today with creatinine 1.11/eGFR 57. On statin and on lisinopril since 04/2020.  Continue regular eye  exams with Dr. Faustino Congress.  Foot exam grossly normal (08/2019), and no evidence of microalbuminuria with urine microalbumin/ creatinine ratio 12 mg/g (01/2021). Emphasized importance of lifestyle modifications, including low carbohydrate diet, regular exercise, and weight loss.  Will reassess HbA1c in 3 months.  5. Peripheral neuropathy, painful. Thought to be secondary to chemotherapy. Did not tolerate gabapentin due to hallucinations. On Cymbalta 30 mg bid and was receiving Roxicodone through pain management.  However, weaned from [place] in 02/2020 and not wishing to restart.  Reported severe pain in feet at night with burning discomfort and cramping.  Started on pregabalin 75 mg twice daily in 02/2021 CGM and finding it to be very effective.  However, reporting some increased symptoms at night requiring use of ibuprofen.  Agreeable to increase dose of pregabalin to 150 mg twice daily.  Advised to continue using Flexeril as needed for cramping. Continue to monitor.  6. Asthma, mild intermittent.  Noting significant improvement since initiation of Singulair 10 mg daily following recent exacerbation in 03/2021.  Also on Flovent for maintenance therapy and albuterol as needed.  Patient reports that she has not yet received nebulizer and will provide with information for medical supply so that she may obtain.  Will readdress at next visit.  7. GERD. On Protonix 40 mg bid with reasonably good control. Follow.  8. Abnormal mammogram. Patient reports had previous biopsy of right breast which was benign. Screening mammogram (02/2019) showed multiple abnormalities in right breast. Right breast diagnostic mammogram and ultrasound (05/12/2019) showed probably benign findings, but recommendation was to proceed with 6 month follow-up.  Underwent repeat diagnostic right mammogram and right breast ultrasound in 10/2020 and findings felt to be benign.  Recommended continuing with annual follow-up and due for bilateral screening  in 02/2021.  Order again placed today and urged to schedule.  Patient with FH history of breast cancer in her mother.  9. Elevated alkaline phosphatase. GGT elevated today at 97 so likely of hepatic origin. AMA negative (04/2020) and alkaline phosphatase isozymes with normal differential and predominant hepatic origin (04/2020).  Right upper quadrant ultrasound ordered but patient never obtained. Repeat alkaline phosphatase normalized to 107 in 01/2021 and remains normal today.  Suspect normalization related to discontinuation of nilotinib.  Will continue to monitor.  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  Reported increasing difficulty with vertigo and stated that she had experienced multiple falls.  Continuing difficulty with disequilibrium.  Referred to balance therapy in 01/2021 but never scheduled.  Addressed today and requesting new referral be placed as still having difficulty.  Urged to schedule.  Advised use of meclizine as needed.   Will readdress at next visit.  11. Insomnia. Using Ambien +/- Benadryl as needed.  Emphasized good sleep hygiene.  Continue to monitor.  12. Eczema. On Xyzal and will use Kenalog as needed.  Controlled today.  13. Left trigger thumb. Evaluated by Dr. Redmond Pulling in 04/2019 and received a cortisone injection with improvement.  Currently stable.  14.  Obesity. Weight had increased nearly 20 pounds since 12/2019.  Started treatment with Ozempic to help with weight loss and diabetes control as discussed, but weight has not changed.  Patient reports that she has now seen a dietitian who has been helpful for weight loss with her mother and sister, and is planning to follow recommendations.  Emphasized importance of continuing with attempts lifestyle modifications, including heart healthy low carbohydrate diet, regular exercise, and weight loss.  15. Health maintenance. Completed 4 doses of the COVID 19 vaccine series due to immunosuppressed status and the bivalent Pfizer booster dose.  Already received  the influenza vaccine and Prevnar 20.  Advised to obtain the Shingrix vaccine. Other immunizations up-to-date.  Bilateral mammogram due in 02/2021 and still has not yet scheduled.  Will again place order today..  Colonoscopy completed in [place] on 05/14/2018.  Normal so 10-year follow-up recommended.  Completed well woman exam with NP Rojelio Brenner and chart updated.  Also completed eye exam with Dr. Faustino Congress.  Vitamin D level remains normal on maintenance dose supplement. Emphasized importance of lifestyle modifications, including heart healthy diet, regular exercise, and weight loss. Medicare wellness visit up-to-date.      Patient understands recommendations and agrees with plan.  Follow-up *** .        Blenda Bridegroom, was evaluated through a synchronous (real-time) audio-video encounter. The patient (or guardian if applicable) is aware that this is a billable service, which includes applicable co-pays. This Virtual Visit was conducted with patient's (and/or legal guardian's) consent. Patient identification was verified, and a caregiver was present when appropriate.   The patient was located at {Telehealth POS - Patient:[id number]}  Provider was located at {Telehealth POS - Provider:[id number]}        Total time spent on this encounter: {Time Spent:21065000}    --Blanch Media, MD on 11/26/2021 at 11:06 PM    An electronic signature was used to authenticate this note.

## 2021-11-28 MED ORDER — PREGABALIN 150 MG PO CAPS
150 MG | ORAL_CAPSULE | Freq: Two times a day (BID) | ORAL | 1 refills | Status: AC
Start: 2021-11-28 — End: 2022-05-27

## 2021-11-28 MED ORDER — PREDNISONE 10 MG PO TABS
10 MG | ORAL_TABLET | ORAL | 0 refills | Status: AC
Start: 2021-11-28 — End: 2021-12-10

## 2021-11-28 MED ORDER — FAMOTIDINE 20 MG PO TABS
20 MG | ORAL_TABLET | Freq: Two times a day (BID) | ORAL | 0 refills | Status: DC
Start: 2021-11-28 — End: 2022-06-26

## 2021-11-28 MED ORDER — SEMAGLUTIDE (2 MG/DOSE) 8 MG/3ML SC SOPN
8 MG/3ML | SUBCUTANEOUS | 3 refills | Status: AC
Start: 2021-11-28 — End: 2022-06-10

## 2021-11-28 NOTE — Telephone Encounter (Signed)
Patient called and stated that her three medications that were refilled yesterday predniSONE (DELTASONE) 10 MG tablet, famotidine (PEPCID) 20 MG tablet and pregabalin (LYRICA) 150 MG capsule she [facility] that she can't get her medications because "they need to be renewed by her PCP". Please advise

## 2021-11-28 NOTE — Telephone Encounter (Signed)
Patient is returning the nurses call.

## 2021-11-28 NOTE — Telephone Encounter (Signed)
[facility] that was sent in.    She needs lyrica and ozempic to go to the mail order pharmacy and then famotidine and [facility]nisone [facility]. Please re-send.

## 2021-11-28 NOTE — Telephone Encounter (Signed)
Attempted to call pharmacy to clarify if they received the rx's that were sent in yesterday. Was on hold over 54mn with no answer. Will try again later.    LMTCB to patient to verify if [facility] was [facility].

## 2021-11-28 NOTE — Telephone Encounter (Signed)
Called and scheduled patients appointments.

## 2021-11-28 NOTE — Addendum Note (Signed)
Addended by: Nelida Meuse on: 11/28/2021 12:17 PM     Modules accepted: Orders

## 2021-12-04 MED ORDER — OMEPRAZOLE MAGNESIUM 20 MG PO TBEC
20 MG | ORAL_TABLET | Freq: Every day | ORAL | 3 refills | Status: DC
Start: 2021-12-04 — End: 2022-06-10

## 2021-12-04 NOTE — Telephone Encounter (Signed)
PCP: Blanch Media, MD    omeprazole (PRILOSEC OTC) 20 MG tablet 20 mg, Oral, DAILY EditCancel Reorder       Summary: Take 1 tablet by mouth daily, Disp-90 tablet, R-3  Normal   Dose, Frequency: 20 mg, DAILY  Start: 10/31/2021  Ord/Sold: 10/31/2021 (O)  Report  Taking:   Long-term:   [id number] [place], [phone] Med Dose History       Patient Sig: Take 1 tablet by mouth daily       Ordered on: 10/31/2021       Authorized by: SBlanch Media      Dispense: 90 tablet       Refills: 3 ordered            Future Appointments   Date Time [place]  01/17/2022  2:50 PM [facility] LAB VISIT [facility] BS AMB   01/24/2022  8:30 AM BBlanch Media MD [facility] AMB

## 2021-12-06 MED ORDER — PREDNISONE 10 MG PO TABS
10 MG | ORAL_TABLET | ORAL | 0 refills | Status: AC
Start: 2021-12-06 — End: 2021-12-18

## 2021-12-06 NOTE — Telephone Encounter (Signed)
Patient calling stating she still has not received the RX for prednisone. She states [facility] was [facility] and she requested that [facility] request the RX from [place]. She reports on her patient portal with [facility] it shows that they have requested this RX and that they are waiting on a response from [place]. I have not seen a request via fax or electronically. Please send prednisone RX to [facility] today for this patient.

## 2021-12-20 MED ORDER — MONTELUKAST SODIUM 10 MG PO TABS
10 MG | ORAL_TABLET | ORAL | 1 refills | Status: AC
Start: 2021-12-20 — End: ?

## 2021-12-20 NOTE — Telephone Encounter (Signed)
PCP: Blanch Media, MD    Last refill per chart:  montelukast (SINGULAIR) 10 MG tablet 10 mg, Oral, DAILY Edit       Summary: Take 1 tablet by mouth daily   Dose, Frequency: 10 mg, DAILY  Start: 03/21/2021  Ord/Sold: 05/27/2021 (O)  Report  Taking:   Long-term:   Med Dose History       Patient Sig: Take 1 tablet by mouth daily       Ordered on: 05/27/2021          Future Appointments   Date Time Provider [place]   01/17/2022  2:50 PM [facility] LAB VISIT [facility] AMB   01/24/2022  8:30 AM Blanch Media, MD [facility] AMB

## 2022-01-17 ENCOUNTER — Ambulatory Visit: Payer: MEDICARE | Primary: Internal Medicine

## 2022-01-18 ENCOUNTER — Ambulatory Visit: Payer: MEDICARE | Primary: Internal Medicine

## 2022-01-21 ENCOUNTER — Ambulatory Visit: Admit: 2022-01-21 | Discharge: 2022-01-21 | Payer: MEDICARE | Primary: Internal Medicine

## 2022-01-21 DIAGNOSIS — I10 Essential (primary) hypertension: Secondary | ICD-10-CM

## 2022-01-22 ENCOUNTER — Inpatient Hospital Stay: Admit: 2022-01-22 | Payer: MEDICARE | Primary: Internal Medicine

## 2022-01-22 LAB — LIPID PANEL
Chol/HDL Ratio: 3.2 (ref 0–5.0)
Cholesterol, Total: 152 MG/DL (ref <200)
HDL: 47 MG/DL (ref 40–60)
LDL Calculated: 78 MG/DL (ref 0–100)
Triglycerides: 135 MG/DL (ref <150)
VLDL Cholesterol Calculated: 27 MG/DL

## 2022-01-22 LAB — CBC WITH AUTO DIFFERENTIAL
Basophils %: 0 % (ref 0–2)
Basophils Absolute: 0 10*3/uL (ref 0.0–0.1)
Eosinophils %: 1 % (ref 0–5)
Eosinophils Absolute: 0.1 10*3/uL (ref 0.0–0.4)
Hematocrit: 43.1 % (ref 35.0–45.0)
Hemoglobin: 13.8 g/dL (ref 12.0–16.0)
Immature Granulocytes %: 0 % (ref 0.0–0.5)
Immature Granulocytes Absolute: 0 10*3/uL (ref 0.00–0.04)
Lymphocytes %: 29 % (ref 21–52)
Lymphocytes Absolute: 2.3 10*3/uL (ref 0.9–3.6)
MCH: 31.4 PG (ref 24.0–34.0)
MCHC: 32 g/dL (ref 31.0–37.0)
MCV: 98 FL (ref 78.0–100.0)
MPV: 10.7 FL (ref 9.2–11.8)
Monocytes %: 6 % (ref 3–10)
Monocytes Absolute: 0.4 10*3/uL (ref 0.05–1.2)
Neutrophils %: 65 % (ref 40–73)
Neutrophils Absolute: 5.2 10*3/uL (ref 1.8–8.0)
Nucleated RBCs: 0 PER 100 WBC
Platelets: 211 10*3/uL (ref 135–420)
RBC: 4.4 M/uL (ref 4.20–5.30)
RDW: 13.3 % (ref 11.6–14.5)
WBC: 8.1 10*3/uL (ref 4.6–13.2)
nRBC: 0 10*3/uL (ref 0.00–0.01)

## 2022-01-22 LAB — HEMOGLOBIN A1C
Estimated Avg Glucose: 280 mg/dL
Hemoglobin A1C: 11.4 % — ABNORMAL HIGH (ref 4.2–5.6)

## 2022-01-22 LAB — COMPREHENSIVE METABOLIC PANEL
ALT: 35 U/L (ref 13–56)
AST: 24 U/L (ref 10–38)
Albumin/Globulin Ratio: 1.1 (ref 0.8–1.7)
Albumin: 3.3 g/dL — ABNORMAL LOW (ref 3.4–5.0)
Alk Phosphatase: 131 U/L — ABNORMAL HIGH (ref 45–117)
Anion Gap: 5 mmol/L (ref 3.0–18)
BUN/Creatinine Ratio: 10 — ABNORMAL LOW (ref 12–20)
BUN: 10 MG/DL (ref 7.0–18)
CO2: 32 mmol/L (ref 21–32)
Calcium: 9.3 MG/DL (ref 8.5–10.1)
Chloride: 100 mmol/L (ref 100–111)
Creatinine: 1.02 MG/DL (ref 0.6–1.3)
Est, Glom Filt Rate: >60 mL/min/{1.73_m2} (ref >60)
Globulin: 3 g/dL (ref 2.0–4.0)
Glucose: 248 mg/dL — ABNORMAL HIGH (ref 74–99)
Potassium: 3.9 mmol/L (ref 3.5–5.5)
Sodium: 137 mmol/L (ref 136–145)
Total Bilirubin: 0.8 MG/DL (ref 0.2–1.0)
Total Protein: 6.3 g/dL — ABNORMAL LOW (ref 6.4–8.2)

## 2022-01-22 LAB — MAGNESIUM: Magnesium: 1.9 mg/dL (ref 1.6–2.6)

## 2022-01-22 LAB — VITAMIN D 25 HYDROXY: Vit D, 25-Hydroxy: 37.9 ng/mL (ref 30–100)

## 2022-01-22 NOTE — Telephone Encounter (Signed)
Ebony from [facility] called and wanted to inform you that the patient got blood work done there at the lab yesterday but did not leave a urine sample with the lab.

## 2022-01-24 ENCOUNTER — Ambulatory Visit: Payer: MEDICARE | Attending: Internal Medicine | Primary: Internal Medicine

## 2022-02-05 NOTE — Care Coordination-Inpatient (Signed)
Attempted to contact patient for Complex Care follow up at the PCP's recommendation. No answer, left message with contact information provided. Will attempt to contact at a later time.

## 2022-02-06 ENCOUNTER — Telehealth

## 2022-02-06 NOTE — Care Coordination-Inpatient (Signed)
Ambulatory Care Coordination Note  02/06/2022    Patient Current Location:  Home: [place] [id number]    ACM contacted the patient by telephone. Verified name and DOB with patient as identifiers. Provided introduction to self, and explanation of the ACM role.     ACM: Lorna Dibble, RN    Challenges to be reviewed by the provider   Additional needs identified to be addressed with provider: Yes  medications-Pt states currently in the donut hole regarding her Ozempic. States Approx $800/month which is too high for her.  Has already reached out regarding assistance program.  Recommend appt w/ PharmD to address alternatives as she states this has happened last year as well.  Reports her blood sugars have been @ 300s.               Method of communication with provider: phone.    Hx of ALL, DM2, GERD, HLD, HTN, Asthma  Recommended CCM by PCP related to poor A1C, medication noncompliance.  Discussed DM2 meds specifically Ozempic as noted above w/ PCP outreach.  Spoke to front desk, they will message PCP for PharmD referral.  States feeling well otherwise. Very concerned regarding her blood sugars. States best it's been has been 117 yesterday AM when she woke up, but rises quickly through the day.  No other questions/issues at this time.    Offered patient enrollment in the Remote Patient Monitoring (RPM) program for in-home monitoring: Patient is not eligible for RPM program.    Ambulatory Care Coordination Assessment    Care Coordination Protocol  Referral from Primary Care Provider: No  Week 1 - Initial Assessment     Do you have all of your prescriptions and are they filled?: No  Barriers to medication adherence: Other  Other barriers to medication adherence: Cost  Are you able to afford your medications?: No  How often do you have trouble taking your medications the way you have been told to take them?: I always take them as prescribed.     Do you have Home O2 Therapy?: No      Is  patient able to live independently?: Yes     Current Housing: Private Residence  Who do you live with?: Partner/Spouse/SO  Are you an active caregiver in your home?: No     Do you have any DME?: No     Per the Fall Risk Screening, did the patient have 2 or more falls or 1 fall with injury in the past year?: No           Thinking about your patient's physical health needs, are there any symptoms or problems (risk indicators) you are unsure about that require further investigation?: No identified areas of uncertainly or problems already being investigated   Are the patient's physical health problems impacting on their mental well-being?: No identified areas of concern   Are there any problems with your patient's lifestyle behaviors (alcohol, drugs, diet, exercise) that are impacting on physical or mental well-being?: No identified areas of concern   Do you have any other concerns about your patient's mental well-being? How would you rate their severity and impact on the patient?: No identified areas of concern   How do daily activities impact on the patient's well-being? (include current or anticipated unemployment, work, caregiving, access to transportation or other): No identified problems or perceived positive benefits   How would you rate their social network (family, work, friends)?: Good participation with social networks  How would you rate their financial resources (including ability to afford all required medical care)?: Financially secure, some resource challenges   How wells does the patient now understand their health and well-being (symptoms, signs or risk factors) and what they need to do to manage their health?: Reasonable to good understanding and already engages in managing health or is willing to undertake better management   How well do you think your patient can engage in healthcare discussions? (Barriers include language, deafness, aphasia, alcohol or drug problems, learning difficulties,  concentration): Clear and open communication, no identified barriers   Do other services need to be involved to help this patient?: Other care/services in place and adequate   Are current services involved with this patient well-coordinated? (Include coordination with other services you are now recommendation): Required care/services in place and adequately coordinated   Suggested Interventions and Community Resources                 Goals        Conditions and Symptoms      I will schedule office visits, as directed by my provider.  I will keep my appointment or reschedule if I have to cancel.  I will notify my provider of any barriers to my plan of care.  I will notify my provider of any symptoms that indicate a worsening of my condition.    Barriers: none  Plan for overcoming my barriers: N/A  Confidence: 10/10  Anticipated Goal Completion Date: 05/08/22         Medication Management      I will take my medication as directed.  I will notify my provider of any problems with medications, like adverse effects or side effects.  I will notify my provider/Care Coordinator if I am unable to afford my medications.  I will notify my provider for advice before I stop taking any of my medication.    Barriers: none  Plan for overcoming my barriers: N/A  Confidence: 10/10  Anticipated Goal Completion Date: 05/08/22       Reduce Risk of Hospitalization      I will take appropriate steps to reduce my risk of Hospitalization to include:  Take all of medications as prescribed  Visit w/ all of my recommended specialists.  Visit w/ my PCP as recommended.  Avoid activities that can trigger my chronic conditions.    Barriers: none  Plan for overcoming my barriers: N/A  Confidence: 10/10  Anticipated Goal Completion Date: 05/08/22                  No future appointments.

## 2022-02-06 NOTE — Telephone Encounter (Signed)
Gerald Stabs, a nurse with [employer] [facility], called and states he was following up with patient regarding her medications and he believes patient would benefit from a visit with Dr Allayne Butcher and is requesting if a referral can be placed?    Also states patient needs an appt with Dr Unknown Jim, states patient is on ozempic but can't afford the $800 a month and needs to change to something else. Next avail is 03/28/22 and he is requesting for patient to be seen sooner.    Please contact patient to follow up on message.    Please advise, thank you.

## 2022-02-06 NOTE — Telephone Encounter (Signed)
Please schedule at 11:30 AM on 02/19/2022.    Referral placed for Pharm.D. Dail as requested.

## 2022-02-07 NOTE — Telephone Encounter (Signed)
Lmtrc -

## 2022-02-08 NOTE — Telephone Encounter (Signed)
Left second message

## 2022-02-13 NOTE — Care Coordination-Inpatient (Signed)
Attempted to contact patient for Complex Care follow up. No answer, left message with contact information provided. Will attempt to contact at a later time.

## 2022-02-19 MED ORDER — OMEPRAZOLE 20 MG PO CPDR
20 MG | ORAL_CAPSULE | Freq: Every day | ORAL | 3 refills | Status: AC
Start: 2022-02-19 — End: ?

## 2022-02-19 NOTE — Telephone Encounter (Signed)
PCP: Blanch Media, MD      LAST REFILL PER CHART:   omeprazole (PRILOSEC OTC) 20 MG tablet 20 mg, Oral, DAILY       Summary: Take 1 tablet by mouth daily, Disp-90 tablet, R-3  Normal   Dose, Frequency: 20 mg, DAILY  Start: 12/04/2021  Ord/Sold: 12/04/2021 (O)  Report  Long-term:   Pharmacy: [facility] - [place], [place] [phone] Wanda Plump [phone]  Med Dose History  Adjust Sig       Patient Sig: Take 1 tablet by mouth daily       Ordered on: 12/04/2021       Authorized by: Blanch Media       Dispense: 90 tablet       Refills: 3 ordered          Future Appointments   Date Time Provider [place]   02/20/2022  9:30 AM Blanch Media, MD [facility] AMB

## 2022-02-20 ENCOUNTER — Ambulatory Visit: Admit: 2022-02-20 | Discharge: 2022-02-20 | Payer: MEDICARE | Attending: Internal Medicine | Primary: Internal Medicine

## 2022-02-20 DIAGNOSIS — E1140 Type 2 diabetes mellitus with diabetic neuropathy, unspecified: Secondary | ICD-10-CM

## 2022-02-20 MED ORDER — ACYCLOVIR 400 MG PO TABS
400 MG | ORAL_TABLET | Freq: Two times a day (BID) | ORAL | 3 refills | Status: DC
Start: 2022-02-20 — End: 2022-06-26

## 2022-02-20 MED ORDER — BECLOMETHASONE DIPROP HFA 80 MCG/ACT IN AERB
80 MCG/ACT | Freq: Two times a day (BID) | RESPIRATORY_TRACT | 3 refills | Status: DC
Start: 2022-02-20 — End: 2022-06-26

## 2022-02-20 MED ORDER — MECLIZINE HCL 25 MG PO TABS
25 MG | ORAL_TABLET | ORAL | 1 refills | Status: DC
Start: 2022-02-20 — End: 2022-06-26

## 2022-02-20 MED ORDER — DULOXETINE HCL 30 MG PO CPEP
30 MG | ORAL_CAPSULE | Freq: Two times a day (BID) | ORAL | 3 refills | Status: AC
Start: 2022-02-20 — End: ?

## 2022-02-20 MED ORDER — MAGNESIUM OXIDE 400 MG PO TABS
400 MG | ORAL_TABLET | Freq: Two times a day (BID) | ORAL | 3 refills | Status: DC
Start: 2022-02-20 — End: 2022-06-26

## 2022-02-20 MED ORDER — MONTELUKAST SODIUM 10 MG PO TABS
10 MG | ORAL_TABLET | ORAL | 3 refills | Status: DC
Start: 2022-02-20 — End: 2022-06-26

## 2022-02-20 MED ORDER — ATORVASTATIN CALCIUM 20 MG PO TABS
20 MG | ORAL_TABLET | Freq: Every day | ORAL | 3 refills | Status: DC
Start: 2022-02-20 — End: 2022-05-14

## 2022-02-20 MED ORDER — TRESIBA FLEXTOUCH 200 UNIT/ML SC SOPN
200 UNIT/ML | Freq: Every evening | SUBCUTANEOUS | 1 refills | Status: DC
Start: 2022-02-20 — End: 2022-07-08

## 2022-02-20 MED ORDER — CYCLOBENZAPRINE HCL 10 MG PO TABS
10 MG | ORAL_TABLET | ORAL | 3 refills | Status: DC
Start: 2022-02-20 — End: 2022-06-26

## 2022-02-20 NOTE — Progress Notes (Signed)
Medicare Annual Wellness Visit    [name] Wu is here for Medicare AWV    Assessment & Plan   Type 2 diabetes mellitus with diabetic neuropathy, with long-term current use of insulin ([place])  -     HM DIABETES FOOT EXAM  -     Comprehensive Metabolic Panel; Future  -     Hemoglobin A1C; Future  -     Magnesium; Future  -     Microalbumin / Creatinine Urine Ratio; Future  Uncontrolled type 2 diabetes mellitus with hyperglycemia (HCC)  -     Hemoglobin A1C; Future  Bone marrow transplant status (HCC)  -     CBC with Auto Differential; Future  Acute lymphoblastic leukemia, in remission (HCC)  -     CBC with Auto Differential; Future  Peripheral neuropathy due to chemotherapy [facility])  Essential hypertension  -     Comprehensive Metabolic Panel; Future  -     Urinalysis with Microscopic; Future  Pure hypercholesterolemia  -     Comprehensive Metabolic Panel; Future  -     Lipid Panel; Future  Elevated alkaline phosphatase level  Mild intermittent asthma without complication  Class 1 obesity due to excess calories with serious comorbidity and body mass index (BMI) of 31.0 to 31.9 in adult  Medicare annual wellness visit, subsequent  ACP (advance care planning)  Post-menopausal  -     DEXA BONE DENSITY AXIAL SKELETON; Future  Screening mammogram for breast cancer  -     US BREAST COMPLETE RIGHT; Future  -     MAM DIGITAL DIAGNOSTIC W OR WO CAD BILATERAL; Future  Abnormal mammogram of right breast  -     US BREAST COMPLETE RIGHT; Future  -     MAM DIGITAL DIAGNOSTIC W OR WO CAD BILATERAL; Future  Medication management  -     Compliance Drug Analysis, Urine; Future  Vitamin D deficiency, unspecified  -     Vitamin D 25 Hydroxy; Future    Recommendations for Preventive Services Due: see orders and patient instructions/AVS.  Recommended screening schedule for the next 5-10 years is provided to the patient in written form: see Patient Instructions/AVS.     Return in about 8 weeks (around 04/17/2022), or if symptoms worsen or  fail to improve.     Subjective   See office progress note for details.      Patient's complete Health Risk Assessment and screening values have been reviewed and are found in Flowsheets. The following problems were reviewed today and where indicated follow up appointments were made and/or referrals ordered.    Positive Risk Factor Screenings with Interventions:    Fall Risk:  Do you feel unsteady or are you worried about falling? : (!) yes  2 or more falls in past year?: no  Fall with injury in past year?: no     Interventions:    Fall precautions stressed    Cognitive:   Words recalled: 3 Words Recalled   Clock Drawing Test (CDT): (!) Abnormal   Total Score: 3   Total Score Interpretation: Normal Mini-Cog      Interventions:  Patient declines any further evaluation or treatment            Opioid Risk: (Low risk score <55) Opioid risk score: 12    Patient is low risk for opioid use disorder or overdose.  Last PDMP [name] as Reviewed:  Review User Review Instant Review Result   Blanch Media  11/27/2021  4:03 PM     Reviewed PDMP [1]           Weight and Activity:  Physical Activity: Sufficiently Active (02/20/2022)    Exercise Vital Sign     Days of Exercise per Week: 7 days     Minutes of Exercise per Session: 30 min     On average, how many days per week do you engage in moderate to strenuous exercise (like a brisk walk)?: 7 days  Have you lost any weight without trying in the past 3 months?: No  Body mass index is 31.32 kg/m. (!) Abnormal  Obesity Interventions:  Attempting to restart GLP 1 agonist and discussed lifestyle modifications          Dentist Screen:  Have you seen the dentist within the past year?: (!) No    Intervention:  Advised to schedule with their dentist     Vision Screen:  Do you have difficulty driving, watching TV, or doing any of your daily activities because of your eyesight?: No  Have you had an eye exam within the past year?: (!) No  No results found.    Interventions:   Patient  encouraged to make appointment with their eye specialist                      Objective   Vitals:    02/20/22 0926   BP: 121/79   Pulse: 86   Resp: 16   Temp: 97.9 F (36.6 C)   TempSrc: Temporal   SpO2: 95%   Weight: 206 lb (93.4 kg)   Height: '5\' 8"'$  (1.727 m)      Body mass index is 31.32 kg/m.        See office progress note for details.         Allergies   Allergen Reactions    Penicillins Hives and Itching     Prior to Visit Medications    Medication Sig Taking? Authorizing Provider   cyclobenzaprine (FLEXERIL) 10 MG tablet TAKE 1 TABLET BY MOUTH THREE TIMES A DAY AS NEEDED FOR MUSCLE SPASMS Yes Blanch Media, MD   montelukast (SINGULAIR) 10 MG tablet TAKE 1 TABLET BY MOUTH DAILY. INDICATIONS: CONTROLLER MEDICATION FOR ASTHMA Yes Blanch Media, MD   acyclovir (ZOVIRAX) 400 MG tablet Take 2 tablets by mouth 2 times daily Yes Blanch Media, MD   Insulin Degludec (TRESIBA FLEXTOUCH) 200 UNIT/ML SOPN Inject 40 Units into the skin every evening Yes Blanch Media, MD   atorvastatin (LIPITOR) 20 MG tablet Take 1 tablet by mouth daily Yes Blanch Media, MD   DULoxetine (CYMBALTA) 30 MG extended release capsule Take 1 capsule by mouth 2 times daily Yes Blanch Media, MD   beclomethasone (QVAR REDIHALER) 80 MCG/ACT AERB inhaler Inhale 2 puffs into the lungs in the morning and 2 puffs in the evening. Yes Blanch Media, MD   magnesium oxide (MAG-OX) 400 MG tablet Take 1 tablet by mouth 2 times daily Yes Blanch Media, MD   meclizine (ANTIVERT) 25 MG tablet TAKE 1 TABLET BY MOUTH THREE (3) TIMES DAILY AS NEEDED FOR DIZZINESS Yes Blanch Media, MD   omeprazole (PRILOSEC OTC) 20 MG tablet Take 1 tablet by mouth daily Yes Blanch Media, MD   pregabalin (LYRICA) 150 MG capsule Take 1 capsule by mouth 2 times daily for 180 days. Max Daily Amount: 300 mg Yes Blanch Media, MD   Semaglutide,  2 MG/DOSE, 8 MG/3ML SOPN Inject 2 mg into the skin once a week Yes Unknown Jim, Royston Cowper, MD    potassium chloride (KLOR-CON M) 20 MEQ extended release tablet Take 2 tablets by mouth daily Yes Unknown Jim, Royston Cowper, MD   lisinopril (PRINIVIL;ZESTRIL) 10 MG tablet TAKE 1 TABLET BY MOUTH DAILY Yes Blanch Media, MD   Insulin Pen Needle 32G X 4 MM MISC 1 each by Does not apply route daily Yes Hunte, Ninfa Meeker, MD   BIOTIN PO Take 1 tablet by mouth daily Yes Automatic Reconciliation, Ar   ZINC PO Take by mouth Yes Automatic Reconciliation, Ar   albuterol sulfate HFA (PROVENTIL;VENTOLIN;PROAIR) 108 (90 Base) MCG/ACT inhaler Inhale 1 puff into the lungs every 4 hours as needed Yes Automatic Reconciliation, Ar   albuterol (PROVENTIL) (2.5 MG/3ML) 0.083% nebulizer solution Inhale 3 mLs into the lungs every 4 hours as needed Yes Automatic Reconciliation, Ar   vitamin D3 (CHOLECALCIFEROL) 125 MCG (5000 UT) TABS tablet Take 1 tablet by mouth daily Yes Automatic Reconciliation, Ar   diphenhydrAMINE (BENADRYL) 25 MG capsule Take 1 capsule by mouth every 6 hours as needed Yes Automatic Reconciliation, Ar   insulin lispro, 0.5 Unit Dial, 100 UNIT/ML SOPN CHECK BLOOD GLUCOSE PRIOR TO MEALS AND AT BEDTIME. TAKE ACCORDING TO SLIDING SCALE FOR BS  180-200 USE 2 UNITS, 201-22O USE 4 UNITS, 221-240 USE 5 UNITS, 241-280 USE 6 UNITS, 281-320 USE 7 UNITS, 321-400 USE 8 UNITS, GREATER THAN 400 CALL DOCTOR Yes Automatic Reconciliation, Ar   ondansetron (ZOFRAN-ODT) 4 MG disintegrating tablet Take 1 tablet by mouth every 8 hours as needed Yes Automatic Reconciliation, Ar   oxyCODONE (ROXICODONE) 5 MG immediate release tablet TAKE 1 TABLET BY MOUTH EVERY 6 HOURS AS NEEDED FOR PAIN MANAGEMENT Yes Automatic Reconciliation, Ar   prochlorperazine (COMPAZINE) 10 MG tablet Take 1 tablet by mouth every 4 hours as needed Yes Automatic Reconciliation, Ar   scopolamine (TRANSDERM-SCOP) transdermal patch Place 1 patch onto the skin every 72 hours Yes Automatic Reconciliation, Ar   Specialty Vitamins Products (MAGNESIUM, AMINO ACID CHELATE,) 133 MG  tablet Take 1 tablet by mouth 2 times daily Yes Automatic Reconciliation, Ar   triamcinolone (KENALOG) 0.1 % cream APPLY TO AFFECTED AREA TWICE A DAY AS NEEDED FOR RASH Yes Automatic Reconciliation, Ar   zolpidem (AMBIEN) 5 MG tablet Take 1 tablet by mouth. Yes Automatic Reconciliation, Ar   famotidine (PEPCID) 20 MG tablet Take 1 tablet by mouth 2 times daily for 14 days  Harrison Paulson, Royston Cowper, MD       CareTeam (Including outside providers/suppliers regularly involved in providing care):   Patient Care Team:  Blanch Media, MD as PCP - General  Unknown Jim Royston Cowper, MD as PCP - Empaneled Provider  Elenora Gamma, APRN - NP as Nurse Practitioner  Lorna Dibble, RN as Ambulatory Care Manager     Reviewed and updated this visit:  Tobacco  Allergies  Meds  Problems  Med Hx  Surg Hx  Soc Hx  Fam Hx

## 2022-02-20 NOTE — Progress Notes (Signed)
HPI:   [name] Wu is a 61 y.o. year old female who presents today for a routine visit.  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 She is continuing to follow with the [facility] in [place].  She has completed the Moderna COVID-19 vaccine series and received two Pfizer booster doses and the updated bivalent booster dose. She reports today that she is doing reasonably well.  She states that she has had complete resolution of her erythematous pruritic rash and states that she believes it was actually due to buying the wrong detergent.  She acknowledges that her diabetes control has been poor and explains that she has not been able to obtain Ozempic or Trulicity due to cost.  She states that she is now in the "donut hole".  She also feels that the 2 courses of prednisone which she needed to take for her erythematous pruritic rash likely contributed.  She notes that her fasting morning blood sugars on Tresiba 36 units nightly are ranging from 120-130, but she does acknowledge that her blood sugars have been in the 200-300 range with meals.  She states that she has been using sliding scale Humalog and states that she often will need to take 20 units with each meal.  She also acknowledges that she will forget to take it at times but will take it after eating when she notes her blood sugar is high.  She does monitor her blood glucose with her CGM.  She was referred to Pharm.D. Dail on 02/06/2022 to help with access to GLP-1 agonist therapy as well as blood sugar control given her worsening HbA1c, but she states that she was not contacted to schedule.     She is otherwise without new complaints.        Summary of prior hospitalizations and medical history:   She has a history of acute lymphoblastic leukemia, diagnosed in 02/2017 after experiencing  severe fatigue and bone pain for several months. She was positive for the [place] chromosome mutation. She presented to the [facility] in [place] for care, and underwent chemotherapy which achieved remission. She subsequently underwent a stem cell transplant on 08/14/2017, felt to be curative. She is now on maintenance therapy with a tyrosine kinase inhibitor, nilotimib 200 mg bid. She is followed by Dr. Noemi Chapel , transplant oncologist, and had been traveling to [facility] every three months for care. She had a follow-up visit with Dr. Vinnie Level in 12/2020 and treatment with nilotinib was discontinued and follow-up recommended on an annual basis.      He has a history of hypertension, treated with amlodipine. She has a history of hyperlipidemia, and was restarted on atorvastatin in 07/2019.  She also has a history of diabetes mellitus, currently being treated with Tresiba, Humalog, and Ozempic. She denies any polyuria, polydipsia, nocturia, or blurry vision, and has no history of retinopathy or nephropathy. 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 She was under the care of pain management and being treated with Cymbalta and Roxicodone as needed, but has noted a significant decrease in the need for narcotics since being started on pregabalin in 02/2020.     She has a history of asthma since childhood, and is being maintained on Flovent and albuterol as needed. She denies any cough or shortness of breath.  In 04/2018, she was admitted to [facility] from 12/4-12/10/2017 for a UTI and then subsequently readmitted from 12/6-12/17/2019 with MRSE bacteremia related to a PICC line and bibasilar pneumonia.      He has a history of GERD, treated with Protonix. She also underwent a screening colonoscopy on 05/14/2018 by Dr. Jacquiline Doe at the [facility] in [place] to evaluate diarrhea post stem cell transplant.   No lesions were found. She denies any abdominal pain, nausea, vomiting, melena, hematochezia, or change in bowel movements.       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[place])     H/O stem cell transplant ([place]) 041/04/19    Hyperlipidemia 03/22/2019    Hypertension     Insomnia 03/22/2019    Mild intermittent asthma without complication 44/0/1027    Peripheral neuropathy due to chemotherapy ([place]) 03/18/2019     Past Surgical History:   Procedure Laterality Date    XR MIDLINE EQUAL OR GREATER THAN 5 YEARS  04/22/2018    XR MIDLINE EQUAL OR GREATER THAN 5 YEARS 04/22/2018     Current Outpatient Medications   Medication Sig    cyclobenzaprine (FLEXERIL) 10 MG tablet TAKE 1 TABLET BY MOUTH THREE TIMES A DAY AS NEEDED FOR MUSCLE SPASMS    montelukast (SINGULAIR) 10 MG tablet TAKE 1 TABLET BY MOUTH DAILY. INDICATIONS: CONTROLLER MEDICATION FOR ASTHMA    acyclovir (ZOVIRAX) 400 MG tablet Take 2 tablets by mouth 2 times daily    Insulin Degludec (TRESIBA FLEXTOUCH) 200 UNIT/ML SOPN Inject 40 Units into the skin every evening    atorvastatin (LIPITOR) 20 MG tablet Take 1 tablet by mouth daily    DULoxetine (CYMBALTA) 30 MG extended release capsule Take 1 capsule by mouth 2 times daily    beclomethasone (QVAR REDIHALER) 80 MCG/ACT AERB inhaler Inhale 2 puffs into the lungs in the morning and 2 puffs in the evening.    magnesium oxide (MAG-OX) 400 MG tablet Take 1 tablet by mouth 2 times daily    meclizine (ANTIVERT) 25 MG tablet TAKE 1 TABLET BY MOUTH THREE (3) TIMES DAILY AS NEEDED FOR DIZZINESS    omeprazole (PRILOSEC OTC) 20 MG tablet Take 1 tablet by mouth daily    pregabalin (LYRICA) 150 MG capsule Take 1 capsule by mouth 2 times daily for 180 days. Max Daily Amount: 300 mg     Semaglutide, 2 MG/DOSE, 8 MG/3ML SOPN Inject 2 mg into the skin once a week    potassium chloride (KLOR-CON M) 20 MEQ extended release tablet Take 2 tablets by mouth daily    lisinopril (PRINIVIL;ZESTRIL) 10 MG tablet TAKE 1 TABLET BY MOUTH DAILY    Insulin Pen Needle 32G X 4 MM MISC 1 each by Does not apply route daily    BIOTIN PO Take 1 tablet by mouth daily    ZINC PO Take by mouth    albuterol sulfate HFA (PROVENTIL;VENTOLIN;PROAIR) 108 (90 Base) MCG/ACT inhaler Inhale 1 puff into the lungs every 4 hours as needed    albuterol (PROVENTIL) (2.5 MG/3ML) 0.083% nebulizer solution Inhale 3 mLs into the lungs every 4 hours as needed    vitamin D3 (CHOLECALCIFEROL) 125 MCG (5000 UT) TABS tablet Take 1  tablet by mouth daily    diphenhydrAMINE (BENADRYL) 25 MG capsule Take 1 capsule by mouth every 6 hours as needed    insulin lispro, 0.5 Unit Dial, 100 UNIT/ML SOPN CHECK BLOOD GLUCOSE PRIOR TO MEALS AND AT BEDTIME. TAKE ACCORDING TO SLIDING SCALE FOR BS  180-200 USE 2 UNITS, 201-22O USE 4 UNITS, 221-240 USE 5 UNITS, 241-280 USE 6 UNITS, 281-320 USE 7 UNITS, 321-400 USE 8 UNITS, GREATER THAN 400 CALL DOCTOR    ondansetron (ZOFRAN-ODT) 4 MG disintegrating tablet Take 1 tablet by mouth every 8 hours as needed    oxyCODONE (ROXICODONE) 5 MG immediate release tablet TAKE 1 TABLET BY MOUTH EVERY 6 HOURS AS NEEDED FOR PAIN MANAGEMENT    prochlorperazine (COMPAZINE) 10 MG tablet Take 1 tablet by mouth every 4 hours as needed    scopolamine (TRANSDERM-SCOP) transdermal patch Place 1 patch onto the skin every 72 hours    Specialty Vitamins Products (MAGNESIUM, AMINO ACID CHELATE,) 133 MG tablet Take 1 tablet by mouth 2 times daily    triamcinolone (KENALOG) 0.1 % cream APPLY TO AFFECTED AREA TWICE A DAY AS NEEDED FOR RASH    zolpidem (AMBIEN) 5 MG tablet Take 1 tablet by mouth.    famotidine (PEPCID) 20 MG tablet Take 1 tablet by mouth 2 times daily for 14 days     No current facility-administered medications for this visit.      Allergies and Intolerances:   Allergies   Allergen Reactions    Penicillins Hives and Itching     Family History:  She has no family history of colon cancer of leukemia. Mother had breast cancer at the age of 25.   Family History   Problem Relation Age of Onset    Breast Cancer Mother      Social History:   She  reports that she has never smoked. She has never used smokeless tobacco. She is divorced and has no children. She is a Optometrist, and working virtually due to the pandemic.     Social History     Substance and Sexual Activity   Alcohol Use No     Immunization History:  Immunization History   Administered Date(s) Administered    COVID-19, MODERNA BLUE border, Primary or Immunocompromised, (age 399y+), IM, 100 mcg/0.54m 06/16/2019, 07/14/2019    COVID-19, MODERNA Bivalent, (age 399y+), IM, 50 mcg/0.5 mL 09/13/2021    COVID-19, MODERNA, (2023-24 formula), (age 399y+), IM, 530m/0.5mL 02/18/2022    COVID-19, PFIZER Bivalent, DO NOT Dilute, (age 399y+), IM, 30 mcg/0.3 mL 01/29/2021    COVID-19, PFIZER PURPLE top, DILUTE for use, (age 39945+), 3085m0.3mL 03/12/2020, 10/22/2020    Influenza Trivalent 02/27/2018, 02/23/2019, 03/01/2020    Influenza Virus Vaccine 02/18/2022    Influenza, FLUARIX, FLULAVAL, FLUZONE (age 76 m90+) AND AFLURIA, (age 39 y75), PF, 0.5mL17m/13/2022    Pneumococcal, PCV20, PREVNAR 20, 40ge 6w+), IM, 0.5mL 23m13/2022    Pneumococcal, PPSV23, PNEUMOVAX 23, (age 2y+), SC/IM, 0.5mL 159m5/2020    TDaP, ADACEL (age 10y-6463y-64ySTRDurwin Reges10y+), IM, 0.5mL 1154m/2020    Zoster Recombinant (Shingrix) 09/13/2021       Review of Systems:   As above included in HPI.  Otherwise 11 point review of systems negative including constitutional, skin, HENT, eyes, respiratory, cardiovascular, gastrointestinal, genitourinary, musculoskeletal, endocrine, hematologic, allergy, and neurologic.    Physical:   Vitals:    02/20/22 0926   BP: 121/79   Pulse: 86   Resp: 16   Temp: 97.9 F (36.6 C)  TempSrc: Temporal    SpO2: 95%   Weight: 206 lb (93.4 kg)   Height: 5' 8"  (1.727 m)       Exam:   Patient appears in no apparent distress. Affect is appropriate.    HEENT: PERRLA, anicteric, oropharynx clear, no JVD, adenopathy or thyromegaly. No carotid bruits or radiated murmur.  Lungs: clear to auscultation, no wheezes, rhonchi, or rales.  Heart: regular rate and rhythm. No murmur, rubs, gallops  Abdomen: soft, nontender, nondistended, normal bowel sounds, no hepatosplenomegaly or masses.   Extremities: without edema.      Diabetic foot exam:   Left Foot:   Visual Exam: normal   Pulse DP: 2+ (normal)   Filament test: normal sensation   Vibratory Sensation: normal  Right Foot:   Visual Exam: normal   Pulse DP: 2+ (normal)   Filament test: normal sensation   Vibratory Sensation: normal        Review of Data:  Labs:  Hospital Outpatient Visit on 01/21/2022   Component Date Value Ref Range Status    Sodium 01/21/2022 137  136 - 145 mmol/L Final    Potassium 01/21/2022 3.9  3.5 - 5.5 mmol/L Final    Chloride 01/21/2022 100  100 - 111 mmol/L Final    CO2 01/21/2022 32  21 - 32 mmol/L Final    Anion Gap 01/21/2022 5  3.0 - 18 mmol/L Final    Glucose 01/21/2022 248 (H)  74 - 99 mg/dL Final    BUN 01/21/2022 10  7.0 - 18 MG/DL Final    Creatinine 01/21/2022 1.02  0.6 - 1.3 MG/DL Final    Bun/Cre Ratio 01/21/2022 10 (L)  12 - 20   Final    Est, Glom Filt Rate 01/21/2022 >60  >60 ml/min/1.53m Final    Calcium 01/21/2022 9.3  8.5 - 10.1 MG/DL Final    Total Bilirubin 01/21/2022 0.8  0.2 - 1.0 MG/DL Final    ALT 01/21/2022 35  13 - 56 U/L Final    AST 01/21/2022 24  10 - 38 U/L Final    Alk Phosphatase 01/21/2022 131 (H)  45 - 117 U/L Final    Total Protein 01/21/2022 6.3 (L)  6.4 - 8.2 g/dL Final    Albumin 01/21/2022 3.3 (L)  3.4 - 5.0 g/dL Final    Globulin 01/21/2022 3.0  2.0 - 4.0 g/dL Final    Albumin/Globulin Ratio 01/21/2022 1.1  0.8 - 1.7   Final    Vit D, 25-Hydroxy 01/21/2022 37.9  30 - 100 ng/mL Final    Magnesium 01/21/2022 1.9   1.6 - 2.6 mg/dL Final    LIPID PANEL 01/21/2022      Final    Cholesterol, Total 01/21/2022 152  <200 MG/DL Final    Triglycerides 01/21/2022 135  <150 MG/DL Final    HDL 01/21/2022 47  40 - 60 MG/DL Final    LDL Calculated 01/21/2022 78  0 - 100 MG/DL Final    VLDL Cholesterol Calculated 01/21/2022 27  MG/DL Final    Chol/HDL Ratio 01/21/2022 3.2  0 - 5.0   Final    WBC 01/21/2022 8.1  4.6 - 13.2 K/uL Final    RBC 01/21/2022 4.40  4.20 - 5.30 M/uL Final    Hemoglobin 01/21/2022 13.8  12.0 - 16.0 g/dL Final    Hematocrit 01/21/2022 43.1  35.0 - 45.0 % Final    MCV 01/21/2022 98.0  78.0 - 100.0 FL Final    MCH 01/21/2022 31.4  24.0 -  34.0 PG Final    MCHC 01/21/2022 32.0  31.0 - 37.0 g/dL Final    RDW 01/21/2022 13.3  11.6 - 14.5 % Final    Platelets 01/21/2022 211  135 - 420 K/uL Final    MPV 01/21/2022 10.7  9.2 - 11.8 FL Final    Nucleated RBCs 01/21/2022 0.0  0 PER 100 WBC Final    nRBC 01/21/2022 0.00  0.00 - 0.01 K/uL Final    Neutrophils % 01/21/2022 65  40 - 73 % Final    Lymphocytes % 01/21/2022 29  21 - 52 % Final    Monocytes % 01/21/2022 6  3 - 10 % Final    Eosinophils % 01/21/2022 1  0 - 5 % Final    Basophils % 01/21/2022 0  0 - 2 % Final    Immature Granulocytes 01/21/2022 0  0.0 - 0.5 % Final    Neutrophils Absolute 01/21/2022 5.2  1.8 - 8.0 K/UL Final    Lymphocytes Absolute 01/21/2022 2.3  0.9 - 3.6 K/UL Final    Monocytes Absolute 01/21/2022 0.4  0.05 - 1.2 K/UL Final    Eosinophils Absolute 01/21/2022 0.1  0.0 - 0.4 K/UL Final    Basophils Absolute 01/21/2022 0.0  0.0 - 0.1 K/UL Final    Absolute Immature Granulocyte 01/21/2022 0.0  0.00 - 0.04 K/UL Final    Differential Type 01/21/2022 AUTOMATED    Final    Hemoglobin A1C 01/21/2022 11.4 (H)  4.2 - 5.6 % Final    eAG 01/21/2022 280  mg/dL Final          Health Maintenance:  Screening:               Mammogram: Right mammo/ultrasound negative (10/2020); bilateral due in 01/2021  OVERDUE              PAP smear: well woman exam with NP Bjorn Loser (Pap/HPV  negative on 02/15/2020)              Colorectal: colonoscopy (05/14/2018) normal.  Dr. Jacquiline Doe in [place].  Due 05/2028.              Depression: none              DM (HbA1c/FPG): HbA1c 11.4 (01/2022)              Hepatitis C: negative (03/2019)              Falls: none              DEXA: unknown              Glaucoma: regular eye exams with Dr. Faustino Congress (last 10/2020) OVERDUE              Smoking: none              Vitamin D: 37.9 (01/2022)              Medicare Wellness: today           Impression:  Patient Active Problem List   Diagnosis    Peripheral neuropathy due to chemotherapy ([place])    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[place])    Essential hypertension    Type 2 diabetes mellitus, with long-term current use of insulin ([facility])    S/P bone marrow transplant ([place])    Eczema    Elevated alkaline phosphatase level    Primary osteoarthritis involving multiple joints    Insomnia    GERD (gastroesophageal  reflux disease)    Vestibular neuronitis    Hyperlipidemia    Class 1 obesity due to excess calories with serious comorbidity in adult    Imbalance    Mild intermittent asthma       Plan:  1. Diabetes mellitus. Previously well controlled on regimen of Tresiba and SS Humalog with HbA1c 6.5 in 03/2019. However, increased to HbA1c 7.1 in 06/2019 and 9.2 in 04/2020 likely related to weight gain.  Now prescribed Freestyle CGM to help with close monitoring of blood sugars  Started on Ozempic in 04/2020 and continued on Tresiba and sliding scale Humalog.  Repeat HbA1c improved to 7.3 in 01/2021 and dose of Ozempic increased to 2 mg weekly.  However, changed to Trulicity 3 mg weekly due to availability issues.  Repeat HbA1c increased to 7.8 (05/2021) and patient admitted to dietary noncompliance.  Lost to follow-up until 01/2022 and HbA1c now 11.4.  Patient reports no longer able to obtain GLP-1 agonist due to cost and on Tresiba 36 units nightly with sliding scale Humalog.  Referral placed to Pharm.D. Dail to help with obtaining  GLP-1 agonist and with blood sugar control.  Advised to increase dose of Tresiba to 40 units daily and continue with sliding scale Humalog.  No evidence of microvascular complications. On statin and on lisinopril since 04/2020.  Continue regular eye exams with Dr. Faustino Congress.  Overdue and urged to schedule.  Foot exam grossly normal (today), and no evidence of microalbuminuria with urine microalbumin/ creatinine ratio 12 mg/g (01/2021).  Will reassess at next visit.  Emphasized importance of lifestyle modifications, including low carbohydrate diet, regular exercise, and weight loss.  Will reassess HbA1c at next visit.  2. Acute lymphoblastic leukemia, +[place] chromosone mutation, s/p stem cell transplant. Diagnosed in 02/2017 and underwent induction chemotherapy followed by stem cell transplant in 08/2017.  Had been on maintenance therapy with a TKI (nilotinib), but discontinued in 12/2020 by her oncologist. Being followed closely by Dr. Noemi Chapel, transplant oncologist, at [facility] in [place].  Reported frequency of follow-up had been decreased to annually with next scheduled visit to [facility] in 12/2022.  CBC remains normal today.  Will continue to monitor.  3. Hypertension. Blood pressure remains well controlled on lisinopril 10 mg daily. Renal function normalized today at 1.02/eGFR > 60.  On potassium and magnesium supplements due to long term difficulty with deficiency and levels have been stable. Will continue to monitor.  4. Hyperlipidemia.  On moderate intensity dose atorvastatin with LDL 78 and HDL 47, indicative of good control. Emphasized importance of lifestyle modifications, including heart healthy diet, regular exercise, and weight loss. Continue to follow.  5. Peripheral neuropathy, painful. Secondary to chemotherapy used to treat ALL. Did not tolerate gabapentin due to hallucinations. On Cymbalta 30 mg bid and was receiving Roxicodone through pain management.  However,  weaned from [place] in 02/2020 and started on pregabalin 75 mg twice daily in 02/2021 and finding it to be very effective.  In 05/2021, reported increased symptoms at night requiring use of ibuprofen and dose of pregabalin increased to 150 mg twice daily with good response. Advised to continue using Flexeril as needed for cramping. Continue to monitor.  6. Asthma, mild intermittent.  Noting significant improvement since initiation of Singulair 10 mg daily following exacerbation in 03/2021.  Also on Flovent for maintenance therapy and albuterol as needed.  Will change Flovent to Qvar today given formulary change by insurance.  Ordered nebulizer and unclear if patient has  yet received.  Will readdress at next visit.  7. GERD. On Protonix 40 mg bid with reasonably good control. Follow.  8. Abnormal mammogram. Patient reports had previous biopsy of right breast which was benign. Screening mammogram (02/2019) showed multiple abnormalities in right breast. Right breast diagnostic mammogram and ultrasound (05/12/2019) showed probably benign findings, but recommendation was to proceed with 6 month follow-up. Underwent repeat diagnostic right mammogram and right breast ultrasound in 10/2020 and findings felt to be benign. Recommended continuing with annual follow-up and due for bilateral screening in 02/2021.  Order again placed today and urged to schedule.  Patient with FH history of breast cancer in her mother.  9. Elevated alkaline phosphatase. GGT elevated today at 97 so likely of hepatic origin. AMA negative (04/2020) and alkaline phosphatase isozymes with normal differential and predominant hepatic origin (04/2020).  Right upper quadrant ultrasound ordered but patient never obtained. Repeat alkaline phosphatase normalized to 107 in 01/2021 but mildly elevated today at 131.  Suspect improvement related to discontinuation of nilotinib.  Will continue to monitor.  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  Reported increasing difficulty  with vertigo and stated that she had experienced multiple falls. Continuing difficulty with disequilibrium.  Referred to balance therapy in 01/2021 and again in 05/2021 but never scheduled. Advised use of meclizine as needed.  Reports overall improvement today and has not had recurrent falls.    11. Insomnia. Using Ambien +/- Benadryl as needed. Emphasized good sleep hygiene.  Continue to monitor.  12. Eczema. On Xyzal and will use Kenalog as needed.   13. Left trigger thumb. Evaluated by Dr. Redmond Pulling in 04/2019 and received a cortisone injection with improvement.  Currently stable.  14.  Obesity. Weight had increased nearly 15 pounds since 12/2019.  Started treatment with Ozempic to help with weight loss and diabetes control as discussed, but no longer able to obtain due to cost. Emphasized importance of continuing with attempts lifestyle modifications, including heart healthy low carbohydrate diet, regular exercise, and weight loss.  15. Health maintenance. Completed 4 doses of the COVID 19 vaccine series due to immunosuppressed status and the bivalent Pfizer booster dose.  Received Prevnar 20.  Advised to obtain the Shingrix vaccine. Discussed recommended vaccines for the fall, including influenza vaccine, updated COVID vaccine, and new RSV vaccine.  Will give influenza vaccine today.  Other immunizations up-to-date.  Bilateral mammogram due in 02/2021 and still has not yet scheduled. Order again placed today and discussed obtaining baseline bone density study as well.  Will readdress at next visit. Colonoscopy completed in [place] on 05/14/2018.  Normal so 10-year follow-up recommended.  Completed well woman exam with NP Rojelio Brenner and chart updated.  Also completed eye exam with Dr. Faustino Congress.  Urged to continue with annual exams.  Vitamin D level remains normal on maintenance dose supplement. Emphasized importance of lifestyle modifications, including heart healthy diet, regular exercise, and weight loss.     In  addition, an annual Medicare wellness visit was done today.      Patient understands recommendations and agrees with plan.  Follow-up in 8 weeks.    This visit required high complexity medically necessary decision making and management plans.     Time spent in preparing for the visit, including review of history, tests done prior to arrival, additional time reviewing clinical data, imaging, outside records and test results:  5  minutes.  Time spent in counseling with patient and/or family members regarding care plan: 30 minutes.  Time spent on same-day documentation, ordering  tests, treatments, and referring patient for further care: 15 minutes.   Time spent on visit does not include time for documentation not completed on day of visit.

## 2022-02-20 NOTE — Patient Instructions (Addendum)
Please call [phone] to schedule your mammogram and bone density study.    Increase your dose of Tresiba to 40 units each evening.    Please schedule your eye exam with Dr. Faustino Congress.    Please provide the date of your second Shingrix vaccine.         Heart-Healthy Diet: Care Instructions  Your Care Instructions     A heart-healthy diet has lots of vegetables, fruits, nuts, beans, and whole grains, and is low in salt. It limits foods that are high in saturated fat, such as meats, cheeses, and fried foods. It may be hard to change your diet, but even small changes can lower your risk of heart attack and heart disease.  Follow-up care is a key part of your treatment and safety. Be sure to make and go to all appointments, and call your doctor if you are having problems. It's also a good idea to know your test results and keep a list of the medicines you take.  How can you care for yourself at home?  Watch your portions  Learn what a serving is. A "serving" and a "portion" are not always the same thing. Make sure that you are not eating larger portions than are recommended. For example, a serving of pasta is  cup. A serving size of meat is 2 to 3 ounces. A 3-ounce serving is about the size of a deck of cards. Measure serving sizes until you are good at "eyeballing" them. Keep in mind that restaurants often serve portions that are 2 or 3 times the size of one serving.  To keep your energy level up and keep you from feeling hungry, eat often but in smaller portions.  Eat only the number of calories you need to stay at a healthy weight. If you need to lose weight, eat fewer calories than your body burns (through exercise and other physical activity).  Eat more fruits and vegetables  Eat a variety of fruit and vegetables every day. Dark green, deep orange, red, or yellow fruits and vegetables are especially good for you. Examples include spinach, carrots, peaches, and berries.  Keep carrots, celery, and other veggies  handy for snacks. Buy fruit that is in season and store it where you can see it so that you will be tempted to eat it.  Cook dishes that have a lot of veggies in them, such as stir-fries and soups.  Limit saturated and trans fat  Read food labels, and try to avoid saturated and trans fats. They increase your risk of heart disease.  Use olive or canola oil when you cook.  Bake, broil, grill, or steam foods instead of frying them.  Choose lean meats instead of high-fat meats such as hot dogs and sausages. Cut off all visible fat when you prepare meat.  Eat fish, skinless poultry, and meat alternatives such as soy products instead of high-fat meats. Soy products, such as tofu, may be especially good for your heart.  Choose low-fat or fat-free milk and dairy products.  Eat foods high in fiber  Eat a variety of grain products every day. Include whole-grain foods that have lots of fiber and nutrients. Examples of whole-grain foods include oats, whole wheat bread, and brown rice.  Buy whole-grain breads and cereals, instead of white bread or pastries.  Limit salt and sodium  Limit how much salt and sodium you eat to help lower your blood pressure.  Taste food before you salt it. Add only a little  salt when you think you need it. With time, your taste buds will adjust to less salt.  Eat fewer snack items, fast foods, and other high-salt, processed foods. Check food labels for the amount of sodium in packaged foods.  Choose low-sodium versions of canned goods (such as soups, vegetables, and beans).  Limit sugar  Limit drinks and foods with added sugar. These include candy, desserts, and soda pop.  Limit alcohol  Limit alcohol to no more than 2 drinks a day for men and 1 drink a day for women. Too much alcohol can cause health problems.  When should you call for help?  Watch closely for changes in your health, and be sure to contact your doctor if:    You would like help planning heart-healthy meals.   Where can you learn  more?  Go to http://clayton-rivera.info/  Enter V137 in the search box to learn more about "Heart-Healthy Diet: Care Instructions."  Current as of: January 01, 2018               Content Version: 12.6   2006-2020 [employer] Inc[employer].   Care instructions adapted under license by [employer] (which disclaims liability or warranty for this information). If you have questions about a medical condition or this instruction, always ask your healthcare professional. [place] any warranty or liability for your use of this information.        Learning About Diabetes Food Guidelines  Your Care Instructions     Meal planning is important to manage diabetes. It helps keep your blood sugar at a target level (which you set with your doctor). You don't have to eat special foods. You can eat what your family eats, including sweets once in a while. But you do have to pay attention to how often you eat and how much you eat of certain foods.  You may want to work with a dietitian or a certified diabetes educator (CDE) to help you plan meals and snacks. A dietitian or CDE can also help you lose weight if that is one of your goals.  What should you know about eating carbs?  Managing the amount of carbohydrate (carbs) you eat is an important part of healthy meals when you have diabetes. Carbohydrate is found in many foods.  Learn which foods have carbs. And learn the amounts of carbs in different foods.  Bread, cereal, pasta, and rice have about 15 grams of carbs in a serving. A serving is 1 slice of bread (1 ounce),  cup of cooked cereal, or 1/3 cup of cooked pasta or rice.  Fruits have 15 grams of carbs in a serving. A serving is 1 small fresh fruit, such as an apple or orange;  of a banana;  cup of cooked or canned fruit;  cup of fruit juice; 1 cup of melon or raspberries; or 2 tablespoons of dried fruit.  Milk and no-sugar-added yogurt have 15 grams of carbs in a serving.  A serving is 1 cup of milk or 2/3 cup of no-sugar-added yogurt.  Starchy vegetables have 15 grams of carbs in a serving. A serving is  cup of mashed potatoes or sweet potato; 1 cup winter squash;  of a small baked potato;  cup of cooked beans; or  cup cooked corn or green peas.  Learn how much carbs to eat each day and at each meal. A dietitian or CDE can teach you how to keep track of the amount of carbs you eat.  This is called carbohydrate counting.  If you are not sure how to count carbohydrate grams, use the Plate Method to plan meals. It is a good, quick way to make sure that you have a balanced meal. It also helps you spread carbs throughout the day.  Divide your plate by types of foods. Put non-starchy vegetables on half the plate, meat or other protein food on one-quarter of the plate, and a grain or starchy vegetable in the final quarter of the plate. To this you can add a small piece of fruit and 1 cup of milk or yogurt, depending on how many carbs you are supposed to eat at a meal.  Try to eat about the same amount of carbs at each meal. Do not "save up" your daily allowance of carbs to eat at one meal.  Proteins have very little or no carbs per serving. Examples of proteins are beef, chicken, [place], fish, eggs, tofu, cheese, cottage cheese, and peanut butter. A serving size of meat is 3 ounces, which is about the size of a deck of cards. Examples of meat substitute serving sizes (equal to 1 ounce of meat) are 1/4 cup of cottage cheese, 1 egg, 1 tablespoon of peanut butter, and  cup of tofu.  How can you eat out and still eat healthy?  Learn to estimate the serving sizes of foods that have carbohydrate. If you measure food at home, it will be easier to estimate the amount in a serving of restaurant food.  If the meal you order has too much carbohydrate (such as potatoes, corn, or baked beans), ask to have a low-carbohydrate food instead. Ask for a salad or green vegetables.  If you use insulin,  check your blood sugar before and after eating out to help you plan how much to eat in the future.  If you eat more carbohydrate at a meal than you had planned, take a walk or do other exercise. This will help lower your blood sugar.  What else should you know?  Limit saturated fat, such as the fat from meat and dairy products. This is a healthy choice because people who have diabetes are at higher risk of heart disease. So choose lean cuts of meat and nonfat or low-fat dairy products. Use olive or canola oil instead of butter or shortening when cooking.  Don't skip meals. Your blood sugar may drop too low if you skip meals and take insulin or certain medicines for diabetes.  Check with your doctor before you drink alcohol. Alcohol can cause your blood sugar to drop too low. Alcohol can also cause a bad reaction if you take certain diabetes medicines.  Follow-up care is a key part of your treatment and safety. Be sure to make and go to all appointments, and call your doctor if you are having problems. It's also a good idea to know your test results and keep a list of the medicines you take.  Where can you learn more?  Go to http://clayton-rivera.info/  Enter I147 in the search box to learn more about "Learning About Diabetes Food Guidelines."  Current as of: May 01, 2018               Content Version: 12.6   2006-2020 Health[employer], Inc[employer].   Care instructions adapted under license by [employer] (which disclaims liability or warranty for this information). If you have questions about a medical condition or this instruction, always ask your healthcare professional. [place] any  warranty or liability for your use of this information.        Learning About Meal Planning for Diabetes  Why plan your meals?     Meal planning can be a key part of managing diabetes. Planning meals and snacks with the right balance of carbohydrate, protein, and fat can help you  keep your blood sugar at the target level you set with your doctor.  You don't have to eat special foods. You can eat what your family eats, including sweets once in a while. But you do have to pay attention to how often you eat and how much you eat of certain foods.  You may want to work with a dietitian or a certified diabetes educator. He or she can give you tips and meal ideas and can answer your questions about meal planning. This health professional can also help you reach a healthy weight if that is one of your goals.  What plan is right for you?  Your dietitian or diabetes educator may suggest that you start with the plate format or carbohydrate counting.  The plate format  The plate format is a simple way to help you manage how you eat. You plan meals by learning how much space each food should take on a plate. Using the plate format helps you spread carbohydrate throughout the day. It can make it easier to keep your blood sugar level within your target range. It also helps you see if you're eating healthy portion sizes.  To use the plate format, you put non-starchy vegetables on half your plate. Add meat or meat substitutes on one-quarter of the plate. Put a grain or starchy vegetable (such as brown rice or a potato) on the final quarter of the plate. You can add a small piece of fruit and some low-fat or fat-free milk or yogurt, depending on your carbohydrate goal for each meal.  Here are some tips for using the plate format:  Make sure that you are not using an oversized plate. A 9-inch plate is best. Many restaurants use larger plates.  Get used to using the plate format at home. Then you can use it when you eat out.  Write down your questions about using the plate format. Talk to your doctor, a dietitian, or a diabetes educator about your concerns.  Carbohydrate counting  With carbohydrate counting, you plan meals based on the amount of carbohydrate in each food. Carbohydrate raises blood sugar higher  and more quickly than any other nutrient. It is found in desserts, breads and cereals, and fruit. It's also found in starchy vegetables such as potatoes and corn, grains such as rice and pasta, and milk and yogurt. Spreading carbohydrate throughout the day helps keep your blood sugar levels within your target range.  Your daily amount depends on several things, including your weight, how active you are, which diabetes medicines you take, and what your goals are for your blood sugar levels. A registered dietitian or diabetes educator can help you plan how much carbohydrate to include in each meal and snack.  A guideline for your daily amount of carbohydrate is:  45 to 60 grams at each meal. That's about the same as 3 to 4 carbohydrate servings.  15 to 20 grams at each snack. That's about the same as 1 carbohydrate serving.  The Nutrition Facts label on packaged foods tells you how much carbohydrate is in a serving of the food. First, look at the serving size on the food  label. Is that the amount you eat in a serving? All of the nutrition information on a food label is based on that serving size. So if you eat more or less than that, you'll need to adjust the other numbers. Total carbohydrate is the next thing you need to look for on the label. If you count carbohydrate servings, one serving of carbohydrate is 15 grams.  For foods that don't come with labels, such as fresh fruits and vegetables, you'll need a guide that lists carbohydrate in these foods. Ask your doctor, dietitian, or diabetes educator about books or other nutrition guides you can use.  If you take insulin, you need to know how many grams of carbohydrate are in a meal. This lets you know how much rapid-acting insulin to take before you eat. If you use an insulin pump, you get a constant rate of insulin during the day. So the pump must be programmed at meals to give you extra insulin to cover the rise in blood sugar after meals.  When you know how much  carbohydrate you will eat, you can take the right amount of insulin. Or, if you always use the same amount of insulin, you need to make sure that you eat the same amount of carbohydrate at meals.  If you need more help to understand carbohydrate counting and food labels, ask your doctor, dietitian, or diabetes educator.  How do you get started with meal planning?  Here are some tips to get started:  Plan your meals a week at a time. Don't forget to include snacks too.  Use cookbooks or online recipes to plan several main meals. Plan some quick meals for busy nights. You also can double some recipes that freeze well. Then you can save half for other busy nights when you don't have time to cook.  Make sure you have the ingredients you need for your recipes. If you're running low on basic items, put these items on your shopping list too.  List foods that you use to make breakfasts, lunches, and snacks. List plenty of fruits and vegetables.  Post this list on the refrigerator. Add to it as you think of more things you need.  Take the list to the store to do your weekly shopping.  Follow-up care is a key part of your treatment and safety. Be sure to make and go to all appointments, and call your doctor if you are having problems. It's also a good idea to know your test results and keep a list of the medicines you take.  Where can you learn more?  Go to http://clayton-rivera.info/  Enter [phone] in the search box to learn more about "Learning About Meal Planning for Diabetes."  Current as of: May 01, 2018               Content Version: 12.6   2006-2020 Healthwise, Incorporated.   Care instructions adapted under license by [employer] (which disclaims liability or warranty for this information). If you have questions about a medical condition or this instruction, always ask your healthcare professional. [place] any warranty or liability for your use of this  information.   Medicare Wellness Visit, Female    The best way to improve and maintain good health is to have a healthy lifestyle by eating a well-balanced diet, exercising regularly, limiting alcohol and stopping smoking.    Regular visits with your physician or non-physician health care provider also support your good health. Preventive screening  tests can find health problems before they become diseases or illnesses.     Preventive services such as immunizations prevent serious infections.    All people over age 411 should have a Pneumovax and a Prevnar-13 shot to prevent potentially life threatening infections with the pneumococcus bacteria, a common cause of pneumonia. These are once in a lifetime unless you and your provider decide differently.    All people over 65 should have a yearly influenza vaccine or "flu" shot. This does not prevent infection with cold viruses but has been proven to prevent hospitalization and death from influenza.    Although Medicare part B "regular Medicare" currently only covers tetanus vaccination in the context of an injury, a tetanus vaccine (Tdap or Td) is recommended every 10 years.    A shingles vaccine is recommended once in a lifetime after age 28. The Shingles vaccine is also not covered by Medicare part B.    Note, however, that both the Shingles vaccine and Tdap/Td are generally covered by secondary carriers. Please check your coverage and out of pocket expenses. Consider contacting your local health department because it may stock these vaccines for a reasonable charge.    We currently have documentation of the following immunization history for you:  Immunization History   Administered Date(s) Administered   . COVID-19, [place], Primary or Immunocompromised, (age 56y+), IM, 100 mcg/0.69m 06/16/2019, 07/14/2019   . COVID-19, MODERNA Bivalent, (age 56y+), IM, 50 mcg/0.5 mL 09/13/2021   . CEusebio Friendly (2023-24 formula), (age 56y+), IM, 59m/0.5mL  02/18/2022   . COVID-19, PFIZER Bivalent, DO NOT Dilute, (age 56y+), IM, 30 mcg/0.3 mL 01/29/2021   . COVID-19, PFIZER PURPLE top, DILUTE for use, (age 61+), 3063m0.3mL 03/12/2020, 10/22/2020   . Influenza Trivalent 02/27/2018, 02/23/2019, 03/01/2020   . Influenza Virus Vaccine 02/18/2022   . Influenza, FLUARIX, FLULAVAL, FLURolandge 6 m75+) AND AFLURIA, (age 41 y67), PF, 0.5mL3m/13/2022   . Pneumococcal, PCV20, PREVNAR 20, (age 6w+), IM, 0.5mL 74m13/2022   . Pneumococcal, PPSV23, PNEUMOVAX 23, (age 2y+), SC/IM, 0.5mL 154m5/2020   . TDaP, ADACEL (age 10y-6454y-64ySTRDurwin Reges10y+), IM, 0.5mL 1121m/2020   . Zoster Recombinant (Shingrix) 09/13/2021       Screening for infection with Hepatitis C is recommended for anyone born between 1945 th66h 1965.The table at the bottom of this document indicates the status of this and other preventive services.    A bone mass density test (DEXA) to screen for osteoporosis or thinning of the bones should be done at least once after age 28 and 86y be done up to every 2 years as determined by you and your health care provider. The most recent DEXA we have on file for you is:  DEXA Results (most recent):  '@BSHSILASTIMGCAT'$ ([id number])@eening for diabetes mellitus with a blood sugar test (glucose) should be done at least every 3 years until age 75. You51nd your health care provider may decide whether to continue screening after age 75. The12ost recent blood glucose we have on file for you is: No results found for: "GLU", "GLUCPOC"      Glaucoma is a disease of the eye due to increased ocular pressure that can lead to blindness. People with risk factors for glaucoma (African American race, diabetes, family history) should be screened at least every 2 years by an eye professional.     Cardiovascular screening tests that check for elevated lipids or cholesterol (fatty part of blood) which can lead  to heart disease and strokes should be done every 4-6 years through age 47. You and  your health care provider may decide whether to continue screening after age 66. The most recent lipid panel we have on file for you is:   Lab Results   Component Value Date/Time    CHOL 152 01/21/2022 03:21 PM    HDL 47 01/21/2022 03:21 PM       Colorectal cancer screening that evaluates for blood or polyps in your colon for people with average risk should be done yearly as a stool test, every five years as a flexible sigmoidoscope or every 10 years as a colonoscopy up to age 71. You and your health care provider may decide whether to continue screening after age 51.    Breast cancer screening with a mammogram is recommended at least once every 2 years  for women age 53-74. You and your health care provider may decide whether to continue screening after age 77. The most recent mammogram we have on file for you is:   MAM Results (most recent):  '@BSHSILASTIMGCAT'$ ([id number]:1)@    Screening for cervical cancer with a pap smear is recommended for all women with a cervix until age 20. The frequency of this test is based on the details of her prior pap smear testing.You and your health care provider may decide whether to continue screening after age 45.    People who have smoked the equivalent of 1 pack per day for 30 years or more may benefit from screening for lung cancer with a yearly low dose CT scan until they have been non smokers for 15 years or competing health conditions render this unlikely to be beneficial. Our records show:n/a    Your Medicare Wellness Exam is recommended annually.    Here is a list of your current Health Maintenance items with a due date:  Health Maintenance   Topic Date Due   . HIV screen  Never done   . Hepatitis B vaccine (1 of 3 - Risk 3-dose series) Never done   . Diabetic retinal exam  10/20/2021   . Breast cancer screen  10/27/2021   . Shingles vaccine (2 of 2) 11/08/2021   . Diabetic Alb to Cr ratio (uACR) test  01/23/2022   . Annual Wellness Visit (AWV)  01/24/2022   . COVID-19 Vaccine  (7 - Mixed Product risk series) 04/15/2022   . A1C test (Diabetic or Prediabetic)  04/22/2022   . Depression Screen  11/28/2022   . Lipids  01/22/2023   . GFR test (Diabetes, CKD 3-4, OR last GFR 15-59)  01/22/2023   . Diabetic foot exam  02/21/2023   . Cervical cancer screen  02/20/2025   . Colorectal Cancer Screen  05/14/2028   . DTaP/Tdap/Td vaccine (2 - Td or Tdap) 03/17/2029   . Flu vaccine  Completed   . Pneumococcal 0-64 years Vaccine  Completed   . Hepatitis C screen  Completed   . Hepatitis A vaccine  Aged Out   . Hib vaccine  Aged Out   . Meningococcal (ACWY) vaccine  Aged Out           Preventing Falls: Care Instructions    Talk to your doctor about the medicines you take. Ask if any of them increase the risk of falls and whether they can be changed or stopped.   Try to exercise regularly. It can help improve your strength and balance. This can help lower your risk of falling.  Practice fall safety and prevention.    Wear low-heeled shoes that fit well and give your feet good support. Talk to your doctor if you have foot problems that make this hard.  Carry a cellphone or wear a medical alert device that you can use to call for help.  Use stepladders instead of chairs to reach high objects. Don't climb if you're at risk for falls. Ask for help, if needed.  Wear the correct eyeglasses, if you need them.    Make your home safer.    Remove rugs, cords, clutter, and furniture from walkways.  Keep your house well lit. Use night-lights in hallways and bathrooms.  Install and use sturdy handrails on stairways.  Wear nonskid footwear, even inside. Don't walk barefoot or in socks without shoes.    Be safe outside.    Use handrails, curb cuts, and ramps whenever possible.  Keep your hands free by using a shoulder bag or backpack.  Try to walk in well-lit areas. Watch out for uneven ground, changes in pavement, and debris.  Be careful in the winter. Walk on the grass or gravel when sidewalks are slippery. Use  de-icer on steps and walkways. Add non-slip devices to shoes.    Put grab bars and nonskid mats in your shower or tub and near the toilet. Try to use a shower chair or bath bench when bathing.   Get into a tub or shower by putting in your weaker leg first. Get out with your strong side first. Have a phone or medical alert device in the bathroom with you.   Where can you learn more?  Go to https://www.bennett.info/ and enter G117 to learn more about "Preventing Falls: Care Instructions."  Current as of: July 18, 2023Content Version: 13.8   2006-2023 [employer], Incorporated.   Care instructions adapted under license by [facility]. If you have questions about a medical condition or this instruction, always ask your healthcare professional. [place] any warranty or liability for your use of this information.           Learning About Mild Cognitive Impairment (MCI)  What is mild cognitive impairment (MCI)?     It's common to forget things sometimes as we get older. But some older people have memory loss that's more than normal aging. It's called mild cognitive impairment, or MCI. It is not the same as dementia.  People with the condition often know that their memory or mental function has changed. Tests may show some loss. But their minds work well overall. They can carry out daily tasks that are normal for them.  People with MCI have a higher chance of one day getting dementia. But not all people who have it will get dementia. Some people may stay the same over time.  What are the symptoms?  People with MCI have more memory loss than what occurs with normal aging. They may have increasing trouble with recalling words and keeping up with conversations. They may also have trouble remembering important events and making decisions.  What puts you at risk?  The risk of getting MCI increases with age. Having high blood pressure or having a family history of MCI may also  increase your risk.  How is it diagnosed?  Your doctor will do a physical exam.  You may be asked questions to check your memory and other mental skills. Your doctor may also talk to close friends and family members. This can help the doctor figure out how your  memory and other mental skills have changed.  You may get blood tests and tests that look at your brain.  These questions and tests can make sure you don't have other conditions that can cause symptoms like MCI. These include depression, sleep problems, and side effects from medicines.  How is it treated?  There are no medicines to treat MCI or to keep it from progressing to dementia. But treating conditions like high blood pressure and diabetes may help. A person with MCI needs routine follow-up visits with their doctor to check on changes in the person's mental skills.  How can you care for yourself at home?  Keeping your body active can help slow MCI. Exercises like walking can help. Try to stay active mentally too. Read or do things like crossword puzzles if you enjoy doing them.  If you need help coping with MCI, you may want to get support from family, friends, a support group, or a counselor who works with people who have [place].  Though the future isn't always clear, it can be good to plan ahead with instructions for your care. These are called advanced directives. Having a plan can help make sure that you get the care you want.  Current as of: May 1, 2023Content Version: 13.8   2006-2023 Healthwise, Incorporated.   Care instructions adapted under license by [facility]. If you have questions about a medical condition or this instruction, always ask your healthcare professional. [place] any warranty or liability for your use of this information.           Learning About Vision Tests  What are vision tests?     The four most common vision tests are visual acuity tests, refraction, visual field tests, and color  vision tests.  Visual acuity (sharpness) tests  These tests are used:  To see if you need glasses or contact lenses.  To monitor an eye problem.  To check an eye injury.  Visual acuity tests are done as part of routine exams. You may also have this test when you get your driver's license or apply for some types of jobs.  Visual field tests  These tests are used:  To check for vision loss in any area of your range of vision.  To screen for certain eye diseases.  To look for nerve damage after a stroke, head injury, or other problem that could reduce blood flow to the brain.  Refraction and color tests  A refraction test is done to find the right prescription for glasses and contact lenses.  A color vision test is done to check for color blindness.  Color vision is often tested as part of a routine exam. You may also have this test when you apply for a job where recognizing different colors is important, such as truck driving, Research officer, trade union, or the [employer].  How are vision tests done?  Visual acuity test   You cover one eye at a time.  You read aloud from a wall chart across the room.  You read aloud from a small card that you hold in your hand.  Refraction   You look into a special device.  The device puts lenses of different strengths in front of each eye to see how strong your glasses or contact lenses need to be.  Visual field tests   Your doctor may have you look through special machines.  Or your doctor may simply have you stare straight ahead while they move a finger  into and out of your field of vision.  Color vision test   You look at pieces of printed test patterns in various colors. You say what number or symbol you see.  Your doctor may have you trace the number or symbol using a pointer.  How do these tests feel?  There is very little chance of having a problem from this test. If dilating drops are used for a vision test, they may make the eyes sting and cause a medicine taste in the mouth.  Follow-up care  is a key part of your treatment and safety. Be sure to make and go to all appointments, and call your doctor if you are having problems. It's also a good idea to know your test results and keep a list of the medicines you take.  Where can you learn more?  Go to https://www.bennett.info/ and enter G551 to learn more about "Learning About Vision Tests."  Current as of: June 6, 2023Content Version: 13.8   2006-2023 Healthwise, Incorporated.   Care instructions adapted under license by [facility]. If you have questions about a medical condition or this instruction, always ask your healthcare professional. [place] any warranty or liability for your use of this information.           Starting a Weight Loss Plan: Care Instructions  Overview     If you're thinking about losing weight, it can be hard to know where to start. Your doctor can help you set up a weight loss plan that best meets your needs. You may want to take a class on nutrition or exercise, or you could join a weight loss support group. If you have questions about how to make changes to your eating or exercise habits, ask your doctor about seeing a registered dietitian or an exercise specialist.  It can be a big challenge to lose weight. But you don't have to make huge changes at once. Make small changes, and stick with them. When those changes become habit, add a few more changes.  If you don't think you're ready to make changes right now, try to pick a date in the future. Make an appointment to see your doctor to discuss whether the time is right for you to start a plan.  Follow-up care is a key part of your treatment and safety. Be sure to make and go to all appointments, and call your doctor if you are having problems. It's also a good idea to know your test results and keep a list of the medicines you take.  How can you care for yourself at home?  Set realistic goals. Many people expect to lose much  more weight than is likely. A weight loss of 5% to 10% of your body weight may be enough to improve your health.  Get family and friends involved to provide support. Talk to them about why you are trying to lose weight, and ask them to help. They can help by participating in exercise and having meals with you, even if they may be eating something different.  Find what works best for you. If you do not have time or do not like to cook, a program that offers meal replacement bars or shakes may be better for you. Or if you like to prepare meals, finding a plan that includes daily menus and recipes may be best.  Ask your doctor about other health professionals who can help you achieve your weight loss goals.  A dietitian  can help you make healthy changes in your diet.  An exercise specialist or personal trainer can help you develop a safe and effective exercise program.  A counselor or psychiatrist can help you cope with issues such as depression, anxiety, or family problems that can make it hard to focus on weight loss.  Consider joining a support group for people who are trying to lose weight. Your doctor can suggest groups in your area.  Where can you learn more?  Go to https://www.bennett.info/ and enter U357 to learn more about "Starting a Weight Loss Plan: Care Instructions."  Current as of: February 28, 2023Content Version: 13.8   2006-2023 Healthwise, Incorporated.   Care instructions adapted under license by [facility]. If you have questions about a medical condition or this instruction, always ask your healthcare professional. [place] any warranty or liability for your use of this information.           A Healthy Heart: Care Instructions  Your Care Instructions     Coronary artery disease, also called heart disease, occurs when a substance called plaque builds up in the vessels that supply oxygen-rich blood to your heart muscle. This can narrow the blood  vessels and reduce blood flow. A heart attack happens when blood flow is completely blocked. A high-fat diet, smoking, and other factors increase the risk of heart disease.  Your doctor has found that you have a chance of having heart disease. You can do lots of things to keep your heart healthy. It may not be easy, but you can change your diet, exercise more, and quit smoking. These steps really work to lower your chance of heart disease.  Follow-up care is a key part of your treatment and safety. Be sure to make and go to all appointments, and call your doctor if you are having problems. It's also a good idea to know your test results and keep a list of the medicines you take.  How can you care for yourself at home?  Diet   Use less salt when you cook and eat. This helps lower your blood pressure. Taste food before salting. Add only a little salt when you think you need it. With time, your taste buds will adjust to less salt.    Eat fewer snack items, fast foods, canned soups, and other high-salt, high-fat, processed foods.    Read food labels and try to avoid saturated and trans fats. They increase your risk of heart disease by raising cholesterol levels.    Limit the amount of solid fat-butter, margarine, and shortening-you eat. Use olive, peanut, or canola oil when you cook. Bake, broil, and steam foods instead of frying them.    Eat a variety of fruit and vegetables every day. Dark green, deep orange, red, or yellow fruits and vegetables are especially good for you. Examples include spinach, carrots, peaches, and berries.    Foods high in fiber can reduce your cholesterol and provide important vitamins and minerals. High-fiber foods include whole-grain cereals and breads, oatmeal, beans, brown rice, citrus fruits, and apples.    Eat lean proteins. Heart-healthy proteins include seafood, lean meats and poultry, eggs, beans, peas, nuts, seeds, and soy products.    Limit drinks and foods with added sugar.  These include candy, desserts, and soda pop.   Lifestyle changes   If your doctor recommends it, get more exercise. Walking is a good choice. Bit by bit, increase the amount you walk every day. Try for at  least 30 minutes on most days of the week. You also may want to swim, bike, or do other activities.    Do not smoke. If you need help quitting, talk to your doctor about stop-smoking programs and medicines. These can increase your chances of quitting for good. Quitting smoking may be the most important step you can take to protect your heart. It is never too late to quit.    Limit alcohol to 2 drinks a day for men and 1 drink a day for women. Too much alcohol can cause health problems.    Manage other health problems such as diabetes, high blood pressure, and high cholesterol. If you think you may have a problem with alcohol or drug use, talk to your doctor.   Medicines   Take your medicines exactly as prescribed. Call your doctor if you think you are having a problem with your medicine.    If your doctor recommends aspirin, take the amount directed each day. Make sure you take aspirin and not another kind of pain reliever, such as acetaminophen (Tylenol).   When should you call for help?   Call 911 if you have symptoms of a heart attack. These may include:   Chest pain or pressure, or a strange feeling in the chest.    Sweating.    Shortness of breath.    Pain, pressure, or a strange feeling in the back, neck, jaw, or upper belly or in one or both shoulders or arms.    Lightheadedness or sudden weakness.    A fast or irregular heartbeat.   After you call 911, the operator may tell you to chew 1 adult-strength or 2 to 4 low-dose aspirin. Wait for an ambulance. Do not try to drive yourself.  Watch closely for changes in your health, and be sure to contact your doctor if you have any problems.  Where can you learn more?  Go to https://www.bennett.info/ and enter F075 to learn more about "A  Healthy Heart: Care Instructions."  Current as of: June 25, 2023Content Version: 13.8   2006-2023 Healthwise, Incorporated.   Care instructions adapted under license by [facility]. If you have questions about a medical condition or this instruction, always ask your healthcare professional. [place] any warranty or liability for your use of this information.      Personalized Preventive Plan for [name] Wu - 02/20/2022  Medicare offers a range of preventive health benefits. Some of the tests and screenings are paid in full while other may be subject to a deductible, co-insurance, and/or copay.    Some of these benefits include a comprehensive review of your medical history including lifestyle, illnesses that may run in your family, and various assessments and screenings as appropriate.    After reviewing your medical record and screening and assessments performed today your provider may have ordered immunizations, labs, imaging, and/or referrals for you.  A list of these orders (if applicable) as well as your Preventive Care list are included within your After Visit Summary for your review.    Other Preventive Recommendations:    A preventive eye exam performed by an eye specialist is recommended every 1-2 years to screen for glaucoma; cataracts, macular degeneration, and other eye disorders.  A preventive dental visit is recommended every 6 months.  Try to get at least 150 minutes of exercise per week or 10,000 steps per day on a pedometer .  Order or download the FREE "Exercise & Physical Activity: Your  Everyday Guide" from [employer] on Aging. Call 213-205-9074 or search [employer] on Aging online.  You need 1200-1500 mg of calcium and 1000-2000 IU of vitamin D per day. It is possible to meet your calcium requirement with diet alone, but a vitamin D supplement is usually necessary to meet this goal.  When exposed to the sun, use a  sunscreen that protects against both UVA and UVB radiation with an SPF of 30 or greater. Reapply every 2 to 3 hours or after sweating, drying off with a towel, or swimming.  Always wear a seat belt when traveling in a car. Always wear a helmet when riding a bicycle or motorcycle.

## 2022-02-20 NOTE — ACP (Advance Care Planning) (Signed)
Advance Care Planning     Advance Care Planning (ACP) Physician/NP/PA Conversation    Date of Conversation: 02/20/2022  Conducted with: Patient with Decision Making Capacity    Healthcare Decision Maker:      Primary Decision Maker: Lamb,Thomas - Other [phone]    Primary Decision Maker: Rodell Perna - Brother/Sister - [phone]    Click here to complete Healthcare Decision Makers including selection of the Healthcare Decision Maker Relationship (ie "Primary")  Today we documented Decision Maker(s) consistent with Legal Next of Kin hierarchy.    Care Preferences:    Hospitalization:  "If your health worsens and it becomes clear that your chance of recovery is unlikely, what would be your preference regarding hospitalization?"  The patient would prefer hospitalization.    Ventilation:  "If you were unable to breath on your own and your chance of recovery was unlikely, what would be your preference about the use of a ventilator (breathing machine) if it was available to you?"  The patient would desire the use of a ventilator.    Resuscitation:  "In the event your heart stopped as a result of an underlying serious health condition, would you want attempts made to restart your heart, or would you prefer a natural death?"  Yes, attempt to resuscitate.    treatment goals, benefit/burden of treatment options, ventilation preferences, hospitalization preferences, resuscitation preferences, and end of life care preferences (vegetative state/imminent death)    Conversation Outcomes / Follow-Up Plan:  ACP in process - information provided, considering goals and options  Reviewed DNR/DNI and patient elects Full Code (Attempt Resuscitation)    Length of Voluntary ACP Conversation in minutes:  16 minutes    Blanch Media, MD

## 2022-02-20 NOTE — Progress Notes (Signed)
[name] Wu presents today for   Chief Complaint   Patient presents with    Medicare AWV       1. "Have you been to the ER, urgent care clinic since your last visit?  Hospitalized since your last visit?" Yes, went to urgent care in August for skin rash.    2. "Have you seen or consulted any other health care providers outside of the [place] since your last visit?" no     3. For patients aged 61-75: Has the patient had a colonoscopy / FIT/ Cologuard? Yes - no Care Gap present      If the patient is female:    4. For patients aged 64-74: Has the patient had a mammogram within the past 2 years? Yes - no Care Gap present      5. For patients aged 21-65: Has the patient had a pap smear? Yes - no Care Gap present

## 2022-02-22 NOTE — Care Coordination-Inpatient (Signed)
Attempted to contact patient for Complex Care follow up. No answer, left message with contact information provided. Will attempt to contact at a later time.

## 2022-02-28 NOTE — Care Coordination-Inpatient (Signed)
Attempted to contact patient for Complex Care follow up. No answer, left message with contact information provided. Will attempt to contact at a later time.

## 2022-03-07 NOTE — Care Coordination-Inpatient (Signed)
Attempted to contact patient for Complex Care follow up. No answer, left message with contact information provided. Will attempt to contact at a later time.

## 2022-03-13 NOTE — Care Coordination-Inpatient (Signed)
Ambulatory Care Coordination Note  03/13/2022    Patient Current Location:  Home: [place] [id number]     ACM contacted the patient by telephone. Verified name and DOB with patient as identifiers. Provided introduction to self, and explanation of the ACM role.     Challenges to be reviewed by the provider   Additional needs identified to be addressed with provider: No  none               Method of communication with provider: none.    ACM: Lorna Dibble, RN    Hx of ALL, DM2, GERD, HLD, HTN, Asthma  Recommended CCM by PCP related to poor A1C, medication noncompliance.  Per patient, looking forward to PharmD appt next week.  Plans on going to patient assistance website to apply for aid for Ozempic.  Reports blood glucose remains elevated but states feeling well otherwise.  No other questions/issues at this time.    Offered patient enrollment in the Remote Patient Monitoring (RPM) program for in-home monitoring: Patient is not eligible for RPM program.    Lab Results       None            Care Coordination Interventions    Referral from Primary Care Provider: No  Suggested Interventions and Community Resources          Goals Addressed                   This Visit's Progress     Conditions and Symptoms   On track     I will schedule office visits, as directed by my provider.  I will keep my appointment or reschedule if I have to cancel.  I will notify my provider of any barriers to my plan of care.  I will notify my provider of any symptoms that indicate a worsening of my condition.    Barriers: none  Plan for overcoming my barriers: N/A  Confidence: 10/10  Anticipated Goal Completion Date: 05/08/22         Medication Management   On track     I will take my medication as directed.  I will notify my provider of any problems with medications, like adverse effects or side effects.  I will notify my provider/Care Coordinator if I am unable to afford my medications.  I will notify my  provider for advice before I stop taking any of my medication.    Barriers: none  Plan for overcoming my barriers: N/A  Confidence: 10/10  Anticipated Goal Completion Date: 05/08/22       Reduce Risk of Hospitalization   On track     I will take appropriate steps to reduce my risk of Hospitalization to include:  Take all of medications as prescribed  Visit w/ all of my recommended specialists.  Visit w/ my PCP as recommended.  Avoid activities that can trigger my chronic conditions.    Barriers: none  Plan for overcoming my barriers: N/A  Confidence: 10/10  Anticipated Goal Completion Date: 05/08/22                Future Appointments   Date Time Provider Pierre   03/20/2022  2:00 PM Jackquline [place] [facility] BS AMB   04/12/2022 10:45 AM IOC LAB VISIT IOC BS AMB   04/23/2022  1:00 PM Sarris, Royston Cowper, MD IOC BS AMB

## 2022-03-13 NOTE — Care Coordination-Inpatient (Signed)
Attempted to contact patient for Complex Care follow up. No answer, left message with contact information provided. Will attempt to contact at a later time.

## 2022-03-20 ENCOUNTER — Ambulatory Visit: Payer: MEDICARE | Primary: Internal Medicine

## 2022-03-20 NOTE — Progress Notes (Unsigned)
Pharmacy Progress Note - Diabetes Management       Assessment / Plan:   Diabetes Management:  Per ADA guidelines, [name] A1c {IS/IS NOT:19932} at goal of < 7%.  ***     Hypertension:  Blood pressure {IS/IS NOT:19932} at goal of < 130/80 mmHg per the AHA guidelines.    - Emphasized the importance of regular physical activity, restricting salt in diet and weight maintenance/loss on overall blood pressure control.    Hyperlipidemia:  The 10-year ASCVD risk score (Arnett DK, et al., 2019) is: 12.1% based on parameters listed. Current lipid treatment guidelines recommend at least moderate-intensity statin doses for all patients with diabetes to decrease overall ASCVD risk. Patient currently qualifies for a *** intensity statin therapy based on current recommendations and is currently taking a *** intensity statin.     Recommendations to [place] Pink, MD:    - ***     Nutrition/Lifestyle Modifications:  - Educated pt on the importance of moderating carbohydrate intake. Reviewed sources of carbohydrates and method to help determine appropriate portion sizes (e.g., Diabetes Plate Method).  - Advised patient to avoid sugar-sweetened beverages and replace with water or diet/zero sugar option.  - Recommend ~30 minutes consistent, moderately intensive, exercise/day or ~150 minutes/week. Start small, stay consistent, and increase length and types of exercise, as tolerated.       Patient will return to clinic in *** week(s) for follow up.        S/O: [name], a 61 y.o. 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  Pt was seen today for diabetes management.  Patient's last A1c was:    Hemoglobin A1C   Date Value Ref Range Status   01/21/2022 11.4 (H) 4.2 - 5.6 % Final     Comment:     (NOTE)  HbA1C Interpretive Ranges  <5.7              Normal  5.7 - 6.4         Consider Prediabetes  >6.5              Consider Diabetes         Interim update:    Pt was last seen by [name] Pink, MD on 02/20/22.  Per her HPI: She acknowledges that her diabetes control has been poor and explains that she has not been able to obtain Ozempic or Trulicity due to cost.  She states that she is now in the "donut hole".  She also feels that the 2 courses of prednisone which she needed to take for her erythematous pruritic rash likely contributed.  She notes that her fasting morning blood sugars on Tresiba 36 units nightly are ranging from 120-130, but she does acknowledge that her blood sugars have been in the 200-300 range with meals.  She states that she has been using sliding scale Humalog and states that she often will need to take 20 units with each meal.  She also acknowledges that she will forget to take it at times but will take it after eating when she notes her blood sugar is high.  She does monitor her blood glucose with her CGM.  She was referred to Pharm.D. [name] Wu on 02/06/2022 to help  with access to GLP-1 agonist therapy as well as blood sugar control given her worsening HbA1c, but she states that she was not contacted to schedule.  Referral reason: Referral placed to Pharm.D. [name] Wu to help with obtaining GLP-1 agonist and with blood sugar control.  Advised to increase dose of Tresiba to 40 units daily and continue with sliding scale Humalog.     Today:   ***      Current anti-hyperglycemic regimen includes:    Key Antihyperglycemic Medications               Insulin Degludec (TRESIBA FLEXTOUCH) 200 UNIT/ML SOPN Inject 40 Units into the skin every evening    Semaglutide, 2 MG/DOSE, 8 MG/3ML SOPN Inject 2 mg into the skin once a week    insulin lispro, 0.5 Unit Dial, 100 UNIT/ML SOPN CHECK BLOOD GLUCOSE  PRIOR TO MEALS AND AT BEDTIME. TAKE ACCORDING TO SLIDING SCALE FOR BS  180-200 USE 2 UNITS, 201-22O USE 4 UNITS, 221-240 USE 5 UNITS, 241-280 USE 6 UNITS, 281-320 USE 7 UNITS, 321-400 USE 8 UNITS, GREATER THAN 400 CALL DOCTOR          Complete current medication regimen includes:  Current Outpatient Medications   Medication Sig    cyclobenzaprine (FLEXERIL) 10 MG tablet TAKE 1 TABLET BY MOUTH THREE TIMES A DAY AS NEEDED FOR MUSCLE SPASMS    montelukast (SINGULAIR) 10 MG tablet TAKE 1 TABLET BY MOUTH DAILY. INDICATIONS: CONTROLLER MEDICATION FOR ASTHMA    acyclovir (ZOVIRAX) 400 MG tablet Take 2 tablets by mouth 2 times daily    Insulin Degludec (TRESIBA FLEXTOUCH) 200 UNIT/ML SOPN Inject 40 Units into the skin every evening    atorvastatin (LIPITOR) 20 MG tablet Take 1 tablet by mouth daily    DULoxetine (CYMBALTA) 30 MG extended release capsule Take 1 capsule by mouth 2 times daily    beclomethasone (QVAR REDIHALER) 80 MCG/ACT AERB inhaler Inhale 2 puffs into the lungs in the morning and 2 puffs in the evening.    magnesium oxide (MAG-OX) 400 MG tablet Take 1 tablet by mouth 2 times daily    meclizine (ANTIVERT) 25 MG tablet TAKE 1 TABLET BY MOUTH THREE (3) TIMES DAILY AS NEEDED FOR DIZZINESS    omeprazole (PRILOSEC OTC) 20 MG tablet Take 1 tablet by mouth daily    famotidine (PEPCID) 20 MG tablet Take 1 tablet by mouth 2 times daily for 14 days    pregabalin (LYRICA) 150 MG capsule Take 1 capsule by mouth 2 times daily for 180 days. Max Daily Amount: 300 mg    Semaglutide, 2 MG/DOSE, 8 MG/3ML SOPN Inject 2 mg into the skin once a week    potassium chloride (KLOR-CON M) 20 MEQ extended release tablet Take 2 tablets by mouth daily    lisinopril (PRINIVIL;ZESTRIL) 10 MG tablet TAKE 1 TABLET BY MOUTH DAILY    Insulin Pen Needle 32G X 4 MM MISC 1 each by Does not apply route daily    BIOTIN PO Take 1 tablet by mouth daily    ZINC PO Take by mouth    albuterol sulfate HFA (PROVENTIL;VENTOLIN;PROAIR) 108 (90 Base)  MCG/ACT inhaler Inhale 1 puff into the lungs every 4 hours as needed    albuterol (PROVENTIL) (2.5 MG/3ML) 0.083% nebulizer solution Inhale 3 mLs into the lungs every 4 hours as needed    vitamin D3 (CHOLECALCIFEROL) 125 MCG (5000 UT) TABS tablet Take 1 tablet by mouth daily    diphenhydrAMINE (BENADRYL) 25 MG capsule Take  1 capsule by mouth every 6 hours as needed    insulin lispro, 0.5 Unit Dial, 100 UNIT/ML SOPN CHECK BLOOD GLUCOSE PRIOR TO MEALS AND AT BEDTIME. TAKE ACCORDING TO SLIDING SCALE FOR BS  180-200 USE 2 UNITS, 201-22O USE 4 UNITS, 221-240 USE 5 UNITS, 241-280 USE 6 UNITS, 281-320 USE 7 UNITS, 321-400 USE 8 UNITS, GREATER THAN 400 CALL DOCTOR    ondansetron (ZOFRAN-ODT) 4 MG disintegrating tablet Take 1 tablet by mouth every 8 hours as needed    oxyCODONE (ROXICODONE) 5 MG immediate release tablet TAKE 1 TABLET BY MOUTH EVERY 6 HOURS AS NEEDED FOR PAIN MANAGEMENT    prochlorperazine (COMPAZINE) 10 MG tablet Take 1 tablet by mouth every 4 hours as needed    scopolamine (TRANSDERM-SCOP) transdermal patch Place 1 patch onto the skin every 72 hours    Specialty Vitamins Products (MAGNESIUM, AMINO ACID CHELATE,) 133 MG tablet Take 1 tablet by mouth 2 times daily    triamcinolone (KENALOG) 0.1 % cream APPLY TO AFFECTED AREA TWICE A DAY AS NEEDED FOR RASH    zolpidem (AMBIEN) 5 MG tablet Take 1 tablet by mouth.     No current facility-administered medications for this visit.     Allergies:  Allergies   Allergen Reactions    Penicillins Hives and Itching       Blood Glucose Monitoring (BGM) or CGM:  - Brought in home glucometer/blood glucose log/CGM reader today:  {YES/NO:19726}  - Conducts/scans: ***   - Fasting BG today: ***  - Post-prandial:   -FBG: ***   -ac lunch: ***    -ac dinner: ***   -hs: ***    ROS:  Today, Pt endorses:  - {Symptoms of Hyperglycemia:17903:::1}  - {Symptoms of Hypoglycemia:17902:::1}    Lifestyle modification(s):  - Eats {Numbers; 1-4 :[id number]} meals/day   - A typical day's food  intake is as follows:   - breakfast: ***   - snack: ***   - lunch: ***   - snack: ***   - dinner: ***   - snack: ***   - typical beverages: ***  - Alcohol consumption? {YES (DEF) - NO:[phone]::"Yes"}    Physical Activity:   {yes no:23937}  - Consists of     Medication Adherence/Access:  - Brought in home medications including prescription/non-prescriptions: {YES/NO:19726}  - Endorses barriers to affording/accessing medications : {YES/NO:19726}  - Uses a pill box/other methods to organize medications: {YES/NO:19726}  - Endorses adherence to current regimen?: {YES/NO:19726}    Vitals/Labs:  Wt Readings from Last 3 Encounters:   02/20/22 93.4 kg (206 lb)   01/23/21 95.7 kg (211 lb)   08/15/20 94.8 kg (209 lb)     BP Readings from Last 3 Encounters:   02/20/22 121/79   01/23/21 134/80   08/15/20 122/80     Pulse Readings from Last 3 Encounters:   02/20/22 86   01/23/21 70   08/15/20 65       Lab Results   Component Value Date/Time    NA 137 01/21/2022 03:21 PM    K 3.9 01/21/2022 03:21 PM    CL 100 01/21/2022 03:21 PM    CO2 32 01/21/2022 03:21 PM    BUN 10 01/21/2022 03:21 PM    GFRAA >60 01/12/2021 11:49 AM    GLOB 3.0 01/21/2022 03:21 PM    ALT 35 01/21/2022 03:21 PM     Lab Results   Component Value Date/Time    CHOL 152 01/21/2022 03:21 PM    HDL 47  01/21/2022 03:21 PM     Lab Results   Component Value Date/Time    WBC 8.1 01/21/2022 03:21 PM    HGB 13.8 01/21/2022 03:21 PM    HCT 43.1 01/21/2022 03:21 PM    PLT 211 01/21/2022 03:21 PM    MCV 98.0 01/21/2022 03:21 PM     No results found for: "HBA1C"  Hemoglobin A1C   Date Value Ref Range Status   01/21/2022 11.4 (H) 4.2 - 5.6 % Final     Comment:     (NOTE)  HbA1C Interpretive Ranges  <5.7              Normal  5.7 - 6.4         Consider Prediabetes  >6.5              Consider Diabetes         Screenings/Prevention Parameters:  -Diabetic Eye and Foot Exams:      Diabetes Management   Topic Date Due    Diabetic retinal exam  10/20/2021    Diabetic Alb to Cr ratio  (uACR) test  01/23/2022         Diabetic Foot Exam HM Status   Topic Date Due    Diabetic Foot Exam  02/21/2023      -Microalbumin / Creatinine ratio:       Lab Results   Component Value Date/Time    MICROALBUR 1.51 01/23/2021 08:00 AM    LABCREA 130.00 01/23/2021 08:00 AM     -ASCVD Risk Score Parameters and Calculation    Race: Black / African American     BP Readings from Last 3 Encounters:   02/20/22 121/79   01/23/21 134/80   08/15/20 122/80        Lab Results   Component Value Date    CHOL 152 01/21/2022    CHOL 137 05/29/2021    CHOL 130 01/12/2021     Lab Results   Component Value Date    TRIG 135 01/21/2022    TRIG 161 (H) 05/29/2021    TRIG 180 (H) 01/12/2021     Lab Results   Component Value Date    HDL 47 01/21/2022    HDL 46 05/29/2021    HDL 42 01/12/2021     Lab Results   Component Value Date    LDLCALC 78 01/21/2022    LDLCALC 58.8 05/29/2021    LDLCALC 52 01/12/2021        Social History     Tobacco Use    Smoking status: Never    Smokeless tobacco: Never   Substance Use Topics    Alcohol use: No         Calculated ASCVD Risk Score: The 10-year ASCVD risk score (Arnett DK, et al., 2019) is: 12.1%    Values used to calculate the score:      Age: 81 years      Sex: Female      Is Non-Hispanic African American: Yes      Diabetic: Yes      Tobacco smoker: No      Systolic Blood Pressure: 121 mmHg      Is BP treated: Yes      HDL Cholesterol: 47 MG/DL      Total Cholesterol: 152 MG/DL    -Statin Safety Parameters:      Lab Results   Component Value Date    ALT 35 01/21/2022    AST 24 01/21/2022    ALKPHOS  131 (H) 01/21/2022    BILITOT 0.8 01/21/2022     -Immunizations:      Immunization History   Administered Date(s) Administered    COVID-19, MODERNA BLUE border, Primary or Immunocompromised, (age 12y+), IM, 100 mcg/0.40mL 06/16/2019, 07/14/2019    COVID-19, MODERNA Bivalent, (age 12y+), IM, 50 mcg/0.5 mL 09/13/2021    COVID-19, MODERNA, (2023-24 formula), (age 12y+), IM, 44mcg/0.5mL 02/18/2022     COVID-19, PFIZER Bivalent, DO NOT Dilute, (age 12y+), IM, 30 mcg/0.3 mL 01/29/2021    COVID-19, PFIZER PURPLE top, DILUTE for use, (age 17 y+), 2mcg/0.3mL 03/12/2020, 10/22/2020    Influenza Trivalent 02/27/2018, 02/23/2019, 03/01/2020    Influenza Virus Vaccine 02/18/2022    Influenza, FLUARIX, FLULAVAL, FLUZONE (age 20 mo+) AND AFLURIA, (age 49 y+), PF, 0.58mL 01/23/2021    Pneumococcal, PCV20, PREVNAR 20, (age 20w+), IM, 0.39mL 01/23/2021    Pneumococcal, PPSV23, PNEUMOVAX 23, (age 2y+), SC/IM, 0.96mL 03/18/2019    TDaP, ADACEL (age 200y-64y), BOOSTRIX (age 10y+), IM, 0.36mL 03/18/2019    Zoster Recombinant (Shingrix) 09/13/2021       Additional Laboratory Parameters of Interest:   Estimation of renal function:  Lab Results   Component Value Date/Time    GFRAA >60 01/12/2021 11:49 AM    GFRAA >60 11/22/2020 11:12 AM    GFRAA >60 08/10/2020 03:38 PM     Wt Readings from Last 3 Encounters:   02/20/22 93.4 kg (206 lb)   01/23/21 95.7 kg (211 lb)   08/15/20 94.8 kg (209 lb)     Ht Readings from Last 1 Encounters:   02/20/22 1.727 m (5\' 8" )     Calculated estimated creatinine clearance: CrCl cannot be calculated (Unknown ideal weight.).    Vital Signs Today:    There were no vitals taken for this visit.    There are no discontinued medications.    No orders of the defined types were placed in this encounter.      Future Appointments   Date Time Provider Department Center   03/20/2022  2:00 PM [facility] BS AMB   04/12/2022 10:45 AM [facility] LAB VISIT [facility] AMB   04/23/2022  1:00 PM Sarris, Vinetta Bergamo, MD [facility] BS AMB       Patient verbalized understanding of the information presented and all of the patient's questions were answered.  AVS was handed to the patient. Patient advised to call the office with any additional questions or concerns.    Notifications of recommendations will be sent to [place] Pink, MD for review.      Thank you for the consult,  Estill Cotta, PharmD, BCACP, BC-ADM        [facility] [employer]

## 2022-03-26 NOTE — Care Coordination-Inpatient (Signed)
Attempted to contact patient for Complex Care follow up. No answer, left message with contact information provided. Will attempt to contact at a later time.

## 2022-04-09 NOTE — Care Coordination-Inpatient (Signed)
Attempted to contact patient for Complex Care follow up. No answer, left message with contact information provided. Will attempt to contact at a later time.

## 2022-04-12 ENCOUNTER — Encounter: Payer: MEDICARE | Primary: Internal Medicine

## 2022-04-15 ENCOUNTER — Ambulatory Visit: Payer: MEDICARE | Primary: Internal Medicine

## 2022-04-22 NOTE — Care Coordination-Inpatient (Signed)
Attempted to contact patient for Complex Care follow up. No answer, left message with contact information provided. Will attempt to contact at a later time.

## 2022-04-23 ENCOUNTER — Ambulatory Visit: Payer: MEDICARE | Attending: Internal Medicine | Primary: Internal Medicine

## 2022-04-23 NOTE — Telephone Encounter (Signed)
-----   Message from Lorenza Burton sent at 04/23/2022  9:20 AM EST -----  Subject: Appointment Request    Reason for Call: Established Patient Appointment needed: Routine Existing   Condition Follow Up    QUESTIONS    Reason for appointment request? Available appointments did not meet   patient need     Additional Information for Provider? Patient needs reschedule of 12/12   appt for date sooner than Feb. Please callback with afternoon appt.  ---------------------------------------------------------------------------  --------------  Rod Can INFO  [phone]; OK to leave message on voicemail  ---------------------------------------------------------------------------  --------------  SCRIPT ANSWERS

## 2022-04-23 NOTE — Telephone Encounter (Signed)
LMTRC

## 2022-04-24 ENCOUNTER — Ambulatory Visit: Primary: Internal Medicine

## 2022-04-24 NOTE — Telephone Encounter (Signed)
Patient scheduled

## 2022-04-24 NOTE — Progress Notes (Deleted)
Pharmacy Progress Note - Diabetes Management       Assessment / Plan:   Diabetes Management:  Per ADA guidelines, [name] A1c {IS/IS NOT:19932} at goal of < 7%.  ***     Hypertension:  Blood pressure {IS/IS NOT:19932} at goal of < 130/80 mmHg per the AHA guidelines.    - Emphasized the importance of regular physical activity, restricting salt in diet and weight maintenance/loss on overall blood pressure control.    Hyperlipidemia:  The 10-year ASCVD risk score (Arnett DK, et al., 2019) is: 12.1% based on parameters listed. Current lipid treatment guidelines recommend at least moderate-intensity statin doses for all patients with diabetes to decrease overall ASCVD risk. Patient currently qualifies for a *** intensity statin therapy based on current recommendations and is currently taking a *** intensity statin.     Recommendations to Blanch Media, MD:    - ***     Nutrition/Lifestyle Modifications:  - Educated pt on the importance of moderating carbohydrate intake. Reviewed sources of carbohydrates and method to help determine appropriate portion sizes (e.g., Diabetes Plate Method).  - Advised patient to avoid sugar-sweetened beverages and replace with water or diet/zero sugar option.  - Recommend ~30 minutes consistent, moderately intensive, exercise/day or ~150 minutes/week. Start small, stay consistent, and increase length and types of exercise, as tolerated.       Patient will return to clinic in *** week(s) for follow up.        S/O: [name], a 61 y.o. 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  Pt was seen today for diabetes management.  Patient's last A1c was:    Hemoglobin A1C   Date Value Ref Range Status   01/21/2022 11.4 (H) 4.2 - 5.6 % Final     Comment:     (NOTE)  HbA1C Interpretive Ranges  <5.7              Normal  5.7 - 6.4         Consider Prediabetes  >6.5              Consider Diabetes         Interim update:    Pt was last seen by Blanch Media, MD on 02/20/22.  Per her note: Previously well controlled on regimen of Tresiba and SS Humalog with HbA1c 6.5 in 03/2019. However, increased to HbA1c 7.1 in 06/2019 and 9.2 in 04/2020 likely related to weight gain.  Now prescribed Freestyle CGM to help with close monitoring of blood sugars  Started on Ozempic in 04/2020 and continued on Tresiba and sliding scale Humalog.  Repeat HbA1c improved to 7.3 in 01/2021 and dose of Ozempic increased to 2 mg weekly.  However, changed to Trulicity 3 mg weekly due to availability issues.  Repeat HbA1c increased to 7.8 (05/2021) and patient admitted to dietary noncompliance.  Lost to follow-up until 01/2022 and HbA1c now 11.4.  Patient reports no longer able to obtain GLP-1 agonist due to cost and on Tresiba 36 units nightly with sliding scale Humalog.  Referral placed to Pharm.D. Jamayia Croker to help with obtaining GLP-1 agonist and with blood sugar control.  Advised to increase dose of Tresiba to 40 units daily and continue with sliding  scale Humalog.  No evidence of microvascular complications. On statin and on lisinopril since 04/2020.  Continue regular eye exams with Dr. Faustino Congress.  Overdue and urged to schedule.  Foot exam grossly normal (today), and no evidence of microalbuminuria with urine microalbumin/ creatinine ratio 12 mg/g (01/2021).  Will reassess at next visit.  Emphasized importance of lifestyle modifications, including low carbohydrate diet, regular exercise, and weight loss     Today:   ***      Current anti-hyperglycemic regimen includes:    Key Antihyperglycemic Medications               Insulin Degludec (TRESIBA FLEXTOUCH) 200 UNIT/ML SOPN Inject 40 Units into the skin  every evening    Semaglutide, 2 MG/DOSE, 8 MG/3ML SOPN Inject 2 mg into the skin once a week    insulin lispro, 0.5 Unit Dial, 100 UNIT/ML SOPN CHECK BLOOD GLUCOSE PRIOR TO MEALS AND AT BEDTIME. TAKE ACCORDING TO SLIDING SCALE FOR BS  180-200 USE 2 UNITS, 201-22O USE 4 UNITS, 221-240 USE 5 UNITS, 241-280 USE 6 UNITS, 281-320 USE 7 UNITS, 321-400 USE 8 UNITS, GREATER THAN 400 CALL DOCTOR          Complete current medication regimen includes:  Current Outpatient Medications   Medication Sig    cyclobenzaprine (FLEXERIL) 10 MG tablet TAKE 1 TABLET BY MOUTH THREE TIMES A DAY AS NEEDED FOR MUSCLE SPASMS    montelukast (SINGULAIR) 10 MG tablet TAKE 1 TABLET BY MOUTH DAILY. INDICATIONS: CONTROLLER MEDICATION FOR ASTHMA    acyclovir (ZOVIRAX) 400 MG tablet Take 2 tablets by mouth 2 times daily    Insulin Degludec (TRESIBA FLEXTOUCH) 200 UNIT/ML SOPN Inject 40 Units into the skin every evening    atorvastatin (LIPITOR) 20 MG tablet Take 1 tablet by mouth daily    DULoxetine (CYMBALTA) 30 MG extended release capsule Take 1 capsule by mouth 2 times daily    beclomethasone (QVAR REDIHALER) 80 MCG/ACT AERB inhaler Inhale 2 puffs into the lungs in the morning and 2 puffs in the evening.    magnesium oxide (MAG-OX) 400 MG tablet Take 1 tablet by mouth 2 times daily    meclizine (ANTIVERT) 25 MG tablet TAKE 1 TABLET BY MOUTH THREE (3) TIMES DAILY AS NEEDED FOR DIZZINESS    omeprazole (PRILOSEC OTC) 20 MG tablet Take 1 tablet by mouth daily    famotidine (PEPCID) 20 MG tablet Take 1 tablet by mouth 2 times daily for 14 days    pregabalin (LYRICA) 150 MG capsule Take 1 capsule by mouth 2 times daily for 180 days. Max Daily Amount: 300 mg    Semaglutide, 2 MG/DOSE, 8 MG/3ML SOPN Inject 2 mg into the skin once a week    potassium chloride (KLOR-CON M) 20 MEQ extended release tablet Take 2 tablets by mouth daily    lisinopril (PRINIVIL;ZESTRIL) 10 MG tablet TAKE 1 TABLET BY MOUTH DAILY    Insulin Pen Needle 32G X 4 MM MISC 1 each by  Does not apply route daily    BIOTIN PO Take 1 tablet by mouth daily    ZINC PO Take by mouth    albuterol sulfate HFA (PROVENTIL;VENTOLIN;PROAIR) 108 (90 Base) MCG/ACT inhaler Inhale 1 puff into the lungs every 4 hours as needed    albuterol (PROVENTIL) (2.5 MG/3ML) 0.083% nebulizer solution Inhale 3 mLs into the lungs every 4 hours as needed    vitamin D3 (CHOLECALCIFEROL) 125 MCG (5000 UT) TABS tablet Take 1 tablet by mouth daily  diphenhydrAMINE (BENADRYL) 25 MG capsule Take 1 capsule by mouth every 6 hours as needed    insulin lispro, 0.5 Unit Dial, 100 UNIT/ML SOPN CHECK BLOOD GLUCOSE PRIOR TO MEALS AND AT BEDTIME. TAKE ACCORDING TO SLIDING SCALE FOR BS  180-200 USE 2 UNITS, 201-22O USE 4 UNITS, 221-240 USE 5 UNITS, 241-280 USE 6 UNITS, 281-320 USE 7 UNITS, 321-400 USE 8 UNITS, GREATER THAN 400 CALL DOCTOR    ondansetron (ZOFRAN-ODT) 4 MG disintegrating tablet Take 1 tablet by mouth every 8 hours as needed    oxyCODONE (ROXICODONE) 5 MG immediate release tablet TAKE 1 TABLET BY MOUTH EVERY 6 HOURS AS NEEDED FOR PAIN MANAGEMENT    prochlorperazine (COMPAZINE) 10 MG tablet Take 1 tablet by mouth every 4 hours as needed    scopolamine (TRANSDERM-SCOP) transdermal patch Place 1 patch onto the skin every 72 hours    Specialty Vitamins Products (MAGNESIUM, AMINO ACID CHELATE,) 133 MG tablet Take 1 tablet by mouth 2 times daily    triamcinolone (KENALOG) 0.1 % cream APPLY TO AFFECTED AREA TWICE A DAY AS NEEDED FOR RASH    zolpidem (AMBIEN) 5 MG tablet Take 1 tablet by mouth.     No current facility-administered medications for this visit.     Allergies:  Allergies   Allergen Reactions    Penicillins Hives and Itching       Blood Glucose Monitoring (BGM) or CGM:  - Brought in home glucometer/blood glucose log/CGM reader today:  {YES/NO:19726}  - Conducts/scans: ***   - Fasting BG today: ***  - Post-prandial:   -FBG: ***   -ac lunch: ***    -ac dinner: ***   -hs: ***    ROS:  Today, Pt endorses:  - {Symptoms of  Hyperglycemia:17903:::1}  - {Symptoms of Hypoglycemia:17902:::1}    Lifestyle modification(s):  - Eats {Numbers; 1-4 :[id number]} meals/day   - A typical day's food intake is as follows:   - breakfast: ***   - snack: ***   - lunch: ***   - snack: ***   - dinner: ***   - snack: ***   - typical beverages: ***  - Alcohol consumption? {YES (DEF) - [phone]::"Yes"}    Physical Activity:   {yes no:23937}  - Consists of     Medication Adherence/Access:  - Brought in home medications including prescription/non-prescriptions: {YES/NO:19726}  - Endorses barriers to affording/accessing medications : {YES/NO:19726}  - Uses a pill box/other methods to organize medications: {YES/NO:19726}  - Endorses adherence to current regimen?: {YES/NO:19726}    Vitals/Labs:  Wt Readings from Last 3 Encounters:   02/20/22 93.4 kg (206 lb)   01/23/21 95.7 kg (211 lb)   08/15/20 94.8 kg (209 lb)     BP Readings from Last 3 Encounters:   02/20/22 121/79   01/23/21 134/80   08/15/20 122/80     Pulse Readings from Last 3 Encounters:   02/20/22 86   01/23/21 70   08/15/20 65       Lab Results   Component Value Date/Time    NA 137 01/21/2022 03:21 PM    K 3.9 01/21/2022 03:21 PM    CL 100 01/21/2022 03:21 PM    CO2 32 01/21/2022 03:21 PM    BUN 10 01/21/2022 03:21 PM    GFRAA >60 01/12/2021 11:49 AM    GLOB 3.0 01/21/2022 03:21 PM    ALT 35 01/21/2022 03:21 PM     Lab Results   Component Value Date/Time    CHOL 152 01/21/2022 03:21  PM    HDL 47 01/21/2022 03:21 PM     Lab Results   Component Value Date/Time    WBC 8.1 01/21/2022 03:21 PM    HGB 13.8 01/21/2022 03:21 PM    HCT 43.1 01/21/2022 03:21 PM    PLT 211 01/21/2022 03:21 PM    MCV 98.0 01/21/2022 03:21 PM     No results found for: "HBA1C"  Hemoglobin A1C   Date Value Ref Range Status   01/21/2022 11.4 (H) 4.2 - 5.6 % Final     Comment:     (NOTE)  HbA1C Interpretive Ranges  <5.7              Normal  5.7 - 6.4         Consider Prediabetes  >6.5              Consider Diabetes          Screenings/Prevention Parameters:  -Diabetic Eye and Foot Exams:      Diabetes Management   Topic Date Due    Diabetic retinal exam  10/20/2021    Diabetic Alb to Cr ratio (uACR) test  01/23/2022    A1C test (Diabetic or Prediabetic)  04/22/2022         Diabetic Foot Exam HM Status   Topic Date Due    Diabetic Foot Exam  02/21/2023      -Microalbumin / Creatinine ratio:       Lab Results   Component Value Date/Time    MICROALBUR 1.51 01/23/2021 08:00 AM    LABCREA 130.00 01/23/2021 08:00 AM     -ASCVD Risk Score Parameters and Calculation    Race: Black / African American     BP Readings from Last 3 Encounters:   02/20/22 121/79   01/23/21 134/80   08/15/20 122/80        Lab Results   Component Value Date    CHOL 152 01/21/2022    CHOL 137 05/29/2021    CHOL 130 01/12/2021     Lab Results   Component Value Date    TRIG 135 01/21/2022    TRIG 161 (H) 05/29/2021    TRIG 180 (H) 01/12/2021     Lab Results   Component Value Date    HDL 47 01/21/2022    HDL 46 05/29/2021    HDL 42 01/12/2021     Lab Results   Component Value Date    LDLCALC 78 01/21/2022    [place] 58.8 05/29/2021    [place] 52 01/12/2021        Social History     Tobacco Use    Smoking status: Never    Smokeless tobacco: Never   Substance Use Topics    Alcohol use: No         Calculated ASCVD Risk Score: The 10-year ASCVD risk score (Arnett DK, et al., 2019) is: 12.1%    Values used to calculate the score:      Age: 80 years      Sex: Female      Is Non-Hispanic African American: Yes      Diabetic: Yes      Tobacco smoker: No      Systolic Blood Pressure: 161 mmHg      Is BP treated: Yes      HDL Cholesterol: 47 MG/DL      Total Cholesterol: 152 MG/DL    -Statin Safety Parameters:      Lab Results   Component Value Date  ALT 35 01/21/2022    AST 24 01/21/2022    ALKPHOS 131 (H) 01/21/2022    BILITOT 0.8 01/21/2022     -Immunizations:      Immunization History   Administered Date(s) Administered    COVID-19, MODERNA BLUE border, Primary or  Immunocompromised, (age 59y+), IM, 100 mcg/0.56m 06/16/2019, 07/14/2019    COVID-19, MODERNA Bivalent, (age 59y+), IM, 50 mcg/0.5 mL 09/13/2021    COVID-19, MODERNA, (2023-24 formula), (age 59y+), IM, 560m/0.5mL 02/18/2022    COVID-19, PFIZER Bivalent, DO NOT Dilute, (age 59y+), IM, 30 mcg/0.3 mL 01/29/2021    COVID-19, PFIZER PURPLE top, DILUTE for use, (age 61+), 3044m0.3mL 03/12/2020, 10/22/2020    Influenza Trivalent 02/27/2018, 02/23/2019, 03/01/2020    Influenza Virus Vaccine 02/18/2022    Influenza, FLUARIX, FLULAVAL, FLUZONE (age 52 m67+) AND AFLURIA, (age 52 y63), PF, 0.5mL50m/13/2022    Pneumococcal, PCV20, PREVNAR 20, 30ge 6w+), IM, 0.5mL 51m13/2022    Pneumococcal, PPSV23, PNEUMOVAX 23, (age 2y+), SC/IM, 0.5mL 146m5/2020    TDaP, ADACEL (age 10y-6447y-64ySTRIX (age 10y+), IM, 0.5mL 1151m/2020    Zoster Recombinant (Shingrix) 09/13/2021       Additional Laboratory Parameters of Interest:   Estimation of renal function:  Lab Results   Component Value Date/Time    GFRAA >60 01/12/2021 11:49 AM    GFRAA >60 11/22/2020 11:12 AM    GFRAA >60 08/10/2020 03:38 PM     Wt Readings from Last 3 Encounters:   02/20/22 93.4 kg (206 lb)   01/23/21 95.7 kg (211 lb)   08/15/20 94.8 kg (209 lb)     Ht Readings from Last 1 Encounters:   02/20/22 1.727 m ('5\' 8"'$ )     Calculated estimated creatinine clearance: CrCl cannot be calculated (Unknown ideal weight.).    Vital Signs Today:    There were no vitals taken for this visit.    There are no discontinued medications.    No orders of the defined types were placed in this encounter.      Future Appointments   Date Time Provider DepartmCulloden2/2023  1:00 PM Sarris,Blanch MediaC BS AMB   04/24/2022  3:00 PM [facility]       Patient verbalized understanding of the information presented and all of the patient's questions were answered.  AVS was handed to the patient. Patient advised to call the office with any additional questions or  concerns.    Notifications of recommendations will be sent to Sarris,Blanch Mediar review.      Thank you for the consult,  MichaelJohnston EbbsD, BCACP, BC-ADM        {Common Amb Care/Retail Pharmacy Notes:2[employer]

## 2022-05-08 ENCOUNTER — Ambulatory Visit: Admit: 2022-05-08 | Discharge: 2022-05-08 | Payer: MEDICARE | Primary: Internal Medicine

## 2022-05-08 DIAGNOSIS — Z1231 Encounter for screening mammogram for malignant neoplasm of breast: Secondary | ICD-10-CM

## 2022-05-09 ENCOUNTER — Inpatient Hospital Stay: Admit: 2022-05-09 | Payer: MEDICARE | Primary: Internal Medicine

## 2022-05-09 LAB — COMPREHENSIVE METABOLIC PANEL
ALT: 31 U/L (ref 13–56)
AST: 18 U/L (ref 10–38)
Albumin/Globulin Ratio: 1.2 (ref 0.8–1.7)
Albumin: 3.5 g/dL (ref 3.4–5.0)
Alk Phosphatase: 106 U/L (ref 45–117)
Anion Gap: 4 mmol/L (ref 3.0–18)
BUN/Creatinine Ratio: 14 (ref 12–20)
BUN: 14 MG/DL (ref 7.0–18)
CO2: 30 mmol/L (ref 21–32)
Calcium: 9.7 MG/DL (ref 8.5–10.1)
Chloride: 106 mmol/L (ref 100–111)
Creatinine: 0.99 MG/DL (ref 0.6–1.3)
Est, Glom Filt Rate: >60 mL/min/{1.73_m2} (ref >60)
Globulin: 3 g/dL (ref 2.0–4.0)
Glucose: 164 mg/dL — ABNORMAL HIGH (ref 74–99)
Potassium: 4.2 mmol/L (ref 3.5–5.5)
Sodium: 140 mmol/L (ref 136–145)
Total Bilirubin: 0.8 MG/DL (ref 0.2–1.0)
Total Protein: 6.5 g/dL (ref 6.4–8.2)

## 2022-05-09 LAB — CBC WITH AUTO DIFFERENTIAL
Basophils %: 0 % (ref 0–2)
Basophils Absolute: 0 10*3/uL (ref 0.0–0.1)
Eosinophils %: 2 % (ref 0–5)
Eosinophils Absolute: 0.2 10*3/uL (ref 0.0–0.4)
Hematocrit: 42.1 % (ref 35.0–45.0)
Hemoglobin: 14 g/dL (ref 12.0–16.0)
Immature Granulocytes %: 0 % (ref 0.0–0.5)
Immature Granulocytes Absolute: 0 10*3/uL (ref 0.00–0.04)
Lymphocytes %: 35 % (ref 21–52)
Lymphocytes Absolute: 3 10*3/uL (ref 0.9–3.6)
MCH: 31.6 PG (ref 24.0–34.0)
MCHC: 33.3 g/dL (ref 31.0–37.0)
MCV: 95 FL (ref 78.0–100.0)
MPV: 10.6 FL (ref 9.2–11.8)
Monocytes %: 5 % (ref 3–10)
Monocytes Absolute: 0.4 10*3/uL (ref 0.05–1.2)
Neutrophils %: 58 % (ref 40–73)
Neutrophils Absolute: 5 10*3/uL (ref 1.8–8.0)
Nucleated RBCs: 0 PER 100 WBC
Platelets: 209 10*3/uL (ref 135–420)
RBC: 4.43 M/uL (ref 4.20–5.30)
RDW: 13.2 % (ref 11.6–14.5)
WBC: 8.6 10*3/uL (ref 4.6–13.2)
nRBC: 0 10*3/uL (ref 0.00–0.01)

## 2022-05-09 LAB — LIPID PANEL
Chol/HDL Ratio: 3.1 (ref 0–5.0)
Cholesterol, Total: 143 MG/DL (ref <200)
HDL: 46 MG/DL (ref 40–60)
LDL Calculated: 65.2 MG/DL (ref 0–100)
Triglycerides: 159 MG/DL — ABNORMAL HIGH (ref <150)
VLDL Cholesterol Calculated: 31.8 MG/DL

## 2022-05-09 LAB — VITAMIN D 25 HYDROXY: Vit D, 25-Hydroxy: 44.3 ng/mL (ref 30–100)

## 2022-05-09 LAB — HEMOGLOBIN A1C
Estimated Avg Glucose: 214 mg/dL
Hemoglobin A1C: 9.1 % — ABNORMAL HIGH (ref 4.2–5.6)

## 2022-05-09 LAB — MAGNESIUM: Magnesium: 2.1 mg/dL (ref 1.6–2.6)

## 2022-05-14 MED ORDER — ATORVASTATIN CALCIUM 20 MG PO TABS
20 MG | ORAL_TABLET | Freq: Every day | ORAL | 3 refills | Status: DC
Start: 2022-05-14 — End: 2022-06-26

## 2022-05-14 NOTE — Telephone Encounter (Signed)
Last ov 02/20/2022  Next ov not scheduled  Last refill 02/20/2022

## 2022-05-15 ENCOUNTER — Ambulatory Visit: Payer: MEDICARE | Attending: Internal Medicine | Primary: Internal Medicine

## 2022-05-17 NOTE — Care Coordination-Inpatient (Signed)
Patient has graduated from the Complex Care program on 05/17/22.  Patient/family has the ability to self-manage at this time.  Care management goals have been completed. No further Ambulatory Care Manager follow up scheduled.     Goals Addressed                   This Visit's Progress     COMPLETED: Conditions and Symptoms        I will schedule office visits, as directed by my provider.  I will keep my appointment or reschedule if I have to cancel.  I will notify my provider of any barriers to my plan of care.  I will notify my provider of any symptoms that indicate a worsening of my condition.    Barriers: none  Plan for overcoming my barriers: N/A  Confidence: 10/10  Anticipated Goal Completion Date: 05/08/22         COMPLETED: Medication Management        I will take my medication as directed.  I will notify my provider of any problems with medications, like adverse effects or side effects.  I will notify my provider/Care Coordinator if I am unable to afford my medications.  I will notify my provider for advice before I stop taking any of my medication.    Barriers: none  Plan for overcoming my barriers: N/A  Confidence: 10/10  Anticipated Goal Completion Date: 05/08/22       COMPLETED: Reduce Risk of Hospitalization        I will take appropriate steps to reduce my risk of Hospitalization to include:  Take all of medications as prescribed  Visit w/ all of my recommended specialists.  Visit w/ my PCP as recommended.  Avoid activities that can trigger my chronic conditions.    Barriers: none  Plan for overcoming my barriers: N/A  Confidence: 10/10  Anticipated Goal Completion Date: 05/08/22                Patient has Ambulatory Care Manager's contact information for any further questions, concerns, or needs.  Patients upcoming visits:  No future appointments.

## 2022-06-10 ENCOUNTER — Encounter

## 2022-06-10 NOTE — Telephone Encounter (Signed)
Error

## 2022-06-10 NOTE — Telephone Encounter (Signed)
Patient is scheduled for   Lab appointment 06/21/2022   -- Please order labs if needed  Appointment with Dr. Unknown Jim 06/26/2022  Appointment with Dr. Allayne Butcher 07/08/2022      Patient is also asking for a refill on   Trulicity     Omeprazole     To be sent to her mail order pharmacy.

## 2022-06-10 NOTE — Telephone Encounter (Signed)
Spoke with patient she states her freestyle Elenor Legato is reading high.  Patient states that Freestyle Libre reads high if blood sugar is greater than 399.      Patient states she was calling back to get an appointment because her blood sugar has ben elevated for about 3-4 weeks.      Next Available appointment is 06/26/2022.  Patient states she would like to be seen sooner than that.  Please advise.

## 2022-06-10 NOTE — Addendum Note (Signed)
Addended by: Blanch Media on: 06/10/2022 11:41 PM     Modules accepted: Orders

## 2022-06-10 NOTE — Addendum Note (Signed)
Addended by: Rolla Flatten on: 06/10/2022 04:56 PM     Modules accepted: Orders

## 2022-06-10 NOTE — Telephone Encounter (Signed)
Left message for patient to call the office.    RE:  Patient is scheduled for 06/26/2022 at 1:30 pm to see Dr. Unknown Jim.      Patient will also need to see Dr. Allayne Butcher as well.

## 2022-06-10 NOTE — Telephone Encounter (Signed)
Patient scheduled for appointments.

## 2022-06-10 NOTE — Telephone Encounter (Signed)
-----  Message from Aleatha Borer sent at 06/10/2022  3:49 PM EST -----  Subject: Message to Provider    QUESTIONS  Information for Provider? Patient returned call to office. I am not able   to reach the front desk. Please call again.  ---------------------------------------------------------------------------  --------------  Rod Can INFO  [phone]; OK to leave message on voicemail  ---------------------------------------------------------------------------  --------------  SCRIPT ANSWERS  undefined

## 2022-06-10 NOTE — Telephone Encounter (Signed)
Patient did not keep her last 2 appointments on 04/23/2022 and 05/15/2022.  She also was scheduled for an appointment with Pharm.D. Dail on 04/24/2022 which she canceled.  No sooner appointments available than 06/26/2022 as already overbooked.      It would be beneficial if she rescheduled her appointment with Pharm.D. Dail as he could review her continuous glucose monitor data and help with her diabetes management.

## 2022-06-11 MED ORDER — TRULICITY 3 MG/0.5ML SC SOPN
30.5 MG/0.5ML | SUBCUTANEOUS | 3 refills | Status: DC
Start: 2022-06-11 — End: 2022-07-08

## 2022-06-11 MED ORDER — OMEPRAZOLE MAGNESIUM 20 MG PO TBEC
20 MG | ORAL_TABLET | Freq: Every day | ORAL | 3 refills | Status: DC
Start: 2022-06-11 — End: 2022-08-07

## 2022-06-21 ENCOUNTER — Ambulatory Visit: Payer: MEDICARE | Primary: Internal Medicine

## 2022-06-24 NOTE — Telephone Encounter (Signed)
Current med list faxed.

## 2022-06-24 NOTE — Telephone Encounter (Signed)
Patient switched pharmacy and needs a list of prescriptions faxed over to     [facility], [place], [place] [[id number]]

## 2022-06-25 ENCOUNTER — Ambulatory Visit: Admit: 2022-06-25 | Discharge: 2022-06-25 | Payer: BLUE CROSS/BLUE SHIELD | Primary: Internal Medicine

## 2022-06-25 DIAGNOSIS — Z794 Long term (current) use of insulin: Secondary | ICD-10-CM

## 2022-06-26 ENCOUNTER — Ambulatory Visit
Admit: 2022-06-26 | Discharge: 2022-06-26 | Payer: BLUE CROSS/BLUE SHIELD | Attending: Internal Medicine | Primary: Internal Medicine

## 2022-06-26 DIAGNOSIS — Z1231 Encounter for screening mammogram for malignant neoplasm of breast: Secondary | ICD-10-CM

## 2022-06-26 DIAGNOSIS — E1165 Type 2 diabetes mellitus with hyperglycemia: Secondary | ICD-10-CM

## 2022-06-26 LAB — COMPREHENSIVE METABOLIC PANEL
ALT: 23 IU/L (ref 0–32)
AST: 23 IU/L (ref 0–40)
Albumin/Globulin Ratio: 1.8 (ref 1.2–2.2)
Albumin: 4.1 g/dL (ref 3.9–4.9)
Alkaline Phosphatase: 121 IU/L (ref 44–121)
BUN/Creatinine Ratio: 14 (ref 12–28)
BUN: 14 mg/dL (ref 8–27)
CO2: 27 mmol/L (ref 20–29)
Calcium: 9.4 mg/dL (ref 8.7–10.3)
Chloride: 101 mmol/L (ref 96–106)
Creatinine: 0.97 mg/dL (ref 0.57–1.00)
Est, Glom Filt Rate: 66 mL/min/{1.73_m2} (ref >59)
Globulin, Total: 2.3 g/dL (ref 1.5–4.5)
Glucose: 207 mg/dL — ABNORMAL HIGH (ref 70–99)
Potassium: 4.6 mmol/L (ref 3.5–5.2)
Sodium: 143 mmol/L (ref 134–144)
Total Bilirubin: 0.5 mg/dL (ref 0.0–1.2)
Total Protein: 6.4 g/dL (ref 6.0–8.5)

## 2022-06-26 LAB — MICROALBUMIN / CREATININE URINE RATIO
Albumin, U: <3 ug/mL
Creatinine, Ur: 67.4 mg/dL
Microalb/Creat Ratio: <4 mg/g creat (ref 0–29)

## 2022-06-26 LAB — CBC/DIFF AMBIGUOUS DEFAULT
Basophils %: 0 %
Basophils Absolute: 0 10*3/uL (ref 0.0–0.2)
Eosinophils %: 1 %
Eosinophils Absolute: 0.1 10*3/uL (ref 0.0–0.4)
Hematocrit: 42.6 % (ref 34.0–46.6)
Hemoglobin: 14.6 g/dL (ref 11.1–15.9)
Immature Grans (Abs): 0 10*3/uL (ref 0.0–0.1)
Immature Granulocytes %: 0 %
Lymphocytes %: 34 %
Lymphocytes Absolute: 3 10*3/uL (ref 0.7–3.1)
MCH: 31.7 pg (ref 26.6–33.0)
MCHC: 34.3 g/dL (ref 31.5–35.7)
MCV: 93 fL (ref 79–97)
Monocytes %: 5 %
Monocytes Absolute: 0.4 10*3/uL (ref 0.1–0.9)
Neutrophils %: 60 %
Neutrophils Absolute: 5.4 10*3/uL (ref 1.4–7.0)
Platelets: 226 10*3/uL (ref 150–450)
RBC: 4.6 x10E6/uL (ref 3.77–5.28)
RDW: 13 % (ref 11.7–15.4)
WBC: 9 10*3/uL (ref 3.4–10.8)

## 2022-06-26 LAB — LIPID PANEL
Cholesterol: 157 mg/dL (ref 100–199)
HDL: 48 mg/dL (ref >39)
LDL Calculated: 82 mg/dL (ref 0–99)
Triglycerides: 157 mg/dL — ABNORMAL HIGH (ref 0–149)
VLDL Cholesterol Calculated: 27 mg/dL (ref 5–40)

## 2022-06-26 LAB — SPECIMEN STATUS REPORT

## 2022-06-26 LAB — HEMOGLOBIN A1C: Hemoglobin A1C: 9.8 % — ABNORMAL HIGH (ref 4.8–5.6)

## 2022-06-26 MED ORDER — MECLIZINE HCL 25 MG PO TABS
25 MG | ORAL_TABLET | ORAL | 1 refills | Status: AC
Start: 2022-06-26 — End: 2023-03-18

## 2022-06-26 MED ORDER — ACYCLOVIR 400 MG PO TABS
400 MG | ORAL_TABLET | Freq: Two times a day (BID) | ORAL | 3 refills | Status: AC
Start: 2022-06-26 — End: 2023-03-20

## 2022-06-26 MED ORDER — BECLOMETHASONE DIPROP HFA 80 MCG/ACT IN AERB
80 | Freq: Two times a day (BID) | RESPIRATORY_TRACT | 3 refills | Status: DC
Start: 2022-06-26 — End: 2024-02-18

## 2022-06-26 MED ORDER — INSULIN LISPRO (0.5 UNIT DIAL) 100 UNIT/ML SC SOPN
100 UNIT/ML | SUBCUTANEOUS | 5 refills | Status: DC
Start: 2022-06-26 — End: 2022-07-08

## 2022-06-26 MED ORDER — MAGNESIUM OXIDE 400 MG PO TABS
400 MG | ORAL_TABLET | Freq: Two times a day (BID) | ORAL | 3 refills | Status: AC
Start: 2022-06-26 — End: 2023-03-20

## 2022-06-26 MED ORDER — INSULIN LISPRO (1 UNIT DIAL) 100 UNIT/ML SC SOPN
100 UNIT/ML | SUBCUTANEOUS | 1 refills | Status: DC
Start: 2022-06-26 — End: 2022-11-10

## 2022-06-26 MED ORDER — CYCLOBENZAPRINE HCL 10 MG PO TABS
10 MG | ORAL_TABLET | ORAL | 3 refills | Status: AC
Start: 2022-06-26 — End: 2023-06-10

## 2022-06-26 MED ORDER — LISINOPRIL 10 MG PO TABS
10 MG | ORAL_TABLET | Freq: Every day | ORAL | 3 refills | Status: AC
Start: 2022-06-26 — End: 2022-09-26

## 2022-06-26 MED ORDER — POTASSIUM CHLORIDE CRYS ER 20 MEQ PO TBCR
20 MEQ | ORAL_TABLET | Freq: Every day | ORAL | 3 refills | Status: AC
Start: 2022-06-26 — End: 2023-06-10

## 2022-06-26 MED ORDER — PREGABALIN 150 MG PO CAPS
150 | ORAL_CAPSULE | Freq: Two times a day (BID) | ORAL | 1 refills | Status: AC
Start: 2022-06-26 — End: 2022-12-23

## 2022-06-26 MED ORDER — ATORVASTATIN CALCIUM 20 MG PO TABS
20 | ORAL_TABLET | Freq: Every day | ORAL | 3 refills | Status: AC
Start: 2022-06-26 — End: ?

## 2022-06-26 MED ORDER — MONTELUKAST SODIUM 10 MG PO TABS
10 MG | ORAL_TABLET | ORAL | 3 refills | Status: AC
Start: 2022-06-26 — End: 2022-09-20

## 2022-06-26 NOTE — Patient Instructions (Signed)
Heart-Healthy Diet: Care Instructions  Your Care Instructions     A heart-healthy diet has lots of vegetables, fruits, nuts, beans, and whole grains, and is low in salt. It limits foods that are high in saturated fat, such as meats, cheeses, and fried foods. It may be hard to change your diet, but even small changes can lower your risk of heart attack and heart disease.  Follow-up care is a key part of your treatment and safety. Be sure to make and go to all appointments, and call your doctor if you are having problems. It's also a good idea to know your test results and keep a list of the medicines you take.  How can you care for yourself at home?  Watch your portions  Learn what a serving is. A "serving" and a "portion" are not always the same thing. Make sure that you are not eating larger portions than are recommended. For example, a serving of pasta is  cup. A serving size of meat is 2 to 3 ounces. A 3-ounce serving is about the size of a deck of cards. Measure serving sizes until you are good at "eyeballing" them. Keep in mind that restaurants often serve portions that are 2 or 3 times the size of one serving.  To keep your energy level up and keep you from feeling hungry, eat often but in smaller portions.  Eat only the number of calories you need to stay at a healthy weight. If you need to lose weight, eat fewer calories than your body burns (through exercise and other physical activity).  Eat more fruits and vegetables  Eat a variety of fruit and vegetables every day. Dark green, deep orange, red, or yellow fruits and vegetables are especially good for you. Examples include spinach, carrots, peaches, and berries.  Keep carrots, celery, and other veggies handy for snacks. Buy fruit that is in season and store it where you can see it so that you will be tempted to eat it.  Cook dishes that have a lot of veggies in them, such as stir-fries and soups.  Limit saturated and trans fat  Read food labels, and  try to avoid saturated and trans fats. They increase your risk of heart disease.  Use olive or canola oil when you cook.  Bake, broil, grill, or steam foods instead of frying them.  Choose lean meats instead of high-fat meats such as hot dogs and sausages. Cut off all visible fat when you prepare meat.  Eat fish, skinless poultry, and meat alternatives such as soy products instead of high-fat meats. Soy products, such as tofu, may be especially good for your heart.  Choose low-fat or fat-free milk and dairy products.  Eat foods high in fiber  Eat a variety of grain products every day. Include whole-grain foods that have lots of fiber and nutrients. Examples of whole-grain foods include oats, whole wheat bread, and brown rice.  Buy whole-grain breads and cereals, instead of white bread or pastries.  Limit salt and sodium  Limit how much salt and sodium you eat to help lower your blood pressure.  Taste food before you salt it. Add only a little salt when you think you need it. With time, your taste buds will adjust to less salt.  Eat fewer snack items, fast foods, and other high-salt, processed foods. Check food labels for the amount of sodium in packaged foods.  Choose low-sodium versions of canned goods (such as soups, vegetables, and beans).  Limit  sugar  Limit drinks and foods with added sugar. These include candy, desserts, and soda pop.  Limit alcohol  Limit alcohol to no more than 2 drinks a day for men and 1 drink a day for women. Too much alcohol can cause health problems.  When should you call for help?  Watch closely for changes in your health, and be sure to contact your doctor if:    You would like help planning heart-healthy meals.   Where can you learn more?  Go to https://www.healthwise.net/GoodHelpConnections  Enter V137 in the search box to learn more about "Heart-Healthy Diet: Care Instructions."  Current as of: January 01, 2018               Content Version: 12.6   2006-2020 [employer]wise,  Incorporated.   Care instructions adapted under license by [facility] (which disclaims liability or warranty for this information). If you have questions about a medical condition or this instruction, always ask your healthcare professional. Healthwise, Incorporated disclaims any warranty or liability for your use of this information.        Learning About Diabetes Food Guidelines  Your Care Instructions     Meal planning is important to manage diabetes. It helps keep your blood sugar at a target level (which you set with your doctor). You don't have to eat special foods. You can eat what your family eats, including sweets once in a while. But you do have to pay attention to how often you eat and how much you eat of certain foods.  You may want to work with a dietitian or a certified diabetes educator (CDE) to help you plan meals and snacks. A dietitian or CDE can also help you lose weight if that is one of your goals.  What should you know about eating carbs?  Managing the amount of carbohydrate (carbs) you eat is an important part of healthy meals when you have diabetes. Carbohydrate is found in many foods.  Learn which foods have carbs. And learn the amounts of carbs in different foods.  Bread, cereal, pasta, and rice have about 15 grams of carbs in a serving. A serving is 1 slice of bread (1 ounce),  cup of cooked cereal, or 1/3 cup of cooked pasta or rice.  Fruits have 15 grams of carbs in a serving. A serving is 1 small fresh fruit, such as an apple or orange;  of a banana;  cup of cooked or canned fruit;  cup of fruit juice; 1 cup of melon or raspberries; or 2 tablespoons of dried fruit.  Milk and no-sugar-added yogurt have 15 grams of carbs in a serving. A serving is 1 cup of milk or 2/3 cup of no-sugar-added yogurt.  Starchy vegetables have 15 grams of carbs in a serving. A serving is  cup of mashed potatoes or sweet potato; 1 cup winter squash;  of a small baked potato;  cup of cooked  beans; or  cup cooked corn or green peas.  Learn how much carbs to eat each day and at each meal. A dietitian or CDE can teach you how to keep track of the amount of carbs you eat. This is called carbohydrate counting.  If you are not sure how to count carbohydrate grams, use the Plate Method to plan meals. It is a good, quick way to make sure that you have a balanced meal. It also helps you spread carbs throughout the day.  Divide your plate by types of foods.   Put non-starchy vegetables on half the plate, meat or other protein food on one-quarter of the plate, and a grain or starchy vegetable in the final quarter of the plate. To this you can add a small piece of fruit and 1 cup of milk or yogurt, depending on how many carbs you are supposed to eat at a meal.  Try to eat about the same amount of carbs at each meal. Do not "save up" your daily allowance of carbs to eat at one meal.  Proteins have very little or no carbs per serving. Examples of proteins are beef, chicken, [place], fish, eggs, tofu, cheese, cottage cheese, and peanut butter. A serving size of meat is 3 ounces, which is about the size of a deck of cards. Examples of meat substitute serving sizes (equal to 1 ounce of meat) are 1/4 cup of cottage cheese, 1 egg, 1 tablespoon of peanut butter, and  cup of tofu.  How can you eat out and still eat healthy?  Learn to estimate the serving sizes of foods that have carbohydrate. If you measure food at home, it will be easier to estimate the amount in a serving of restaurant food.  If the meal you order has too much carbohydrate (such as potatoes, corn, or baked beans), ask to have a low-carbohydrate food instead. Ask for a salad or green vegetables.  If you use insulin, check your blood sugar before and after eating out to help you plan how much to eat in the future.  If you eat more carbohydrate at a meal than you had planned, take a walk or do other exercise. This will help lower your blood sugar.  What else  should you know?  Limit saturated fat, such as the fat from meat and dairy products. This is a healthy choice because people who have diabetes are at higher risk of heart disease. So choose lean cuts of meat and nonfat or low-fat dairy products. Use olive or canola oil instead of butter or shortening when cooking.  Don't skip meals. Your blood sugar may drop too low if you skip meals and take insulin or certain medicines for diabetes.  Check with your doctor before you drink alcohol. Alcohol can cause your blood sugar to drop too low. Alcohol can also cause a bad reaction if you take certain diabetes medicines.  Follow-up care is a key part of your treatment and safety. Be sure to make and go to all appointments, and call your doctor if you are having problems. It's also a good idea to know your test results and keep a list of the medicines you take.  Where can you learn more?  Go to http://[facility]-rivera.info/  Enter I147 in the search box to learn more about "Learning About Diabetes Food Guidelines."  Current as of: May 01, 2018               Content Version: 12.6   2006-2020 [employer]wise, Incorporated.   Care instructions adapted under license by [facility] (which disclaims liability or warranty for this information). If you have questions about a medical condition or this instruction, always ask your healthcare professional. [place] any warranty or liability for your use of this information.        Learning About Meal Planning for Diabetes  Why plan your meals?     Meal planning can be a key part of managing diabetes. Planning meals and snacks with the right balance of carbohydrate, protein, and fat can  help you keep your blood sugar at the target level you set with your doctor.  You don't have to eat special foods. You can eat what your family eats, including sweets once in a while. But you do have to pay attention to how often you eat and how much  you eat of certain foods.  You may want to work with a dietitian or a certified diabetes educator. He or she can give you tips and meal ideas and can answer your questions about meal planning. This health professional can also help you reach a healthy weight if that is one of your goals.  What plan is right for you?  Your dietitian or diabetes educator may suggest that you start with the plate format or carbohydrate counting.  The plate format  The plate format is a simple way to help you manage how you eat. You plan meals by learning how much space each food should take on a plate. Using the plate format helps you spread carbohydrate throughout the day. It can make it easier to keep your blood sugar level within your target range. It also helps you see if you're eating healthy portion sizes.  To use the plate format, you put non-starchy vegetables on half your plate. Add meat or meat substitutes on one-quarter of the plate. Put a grain or starchy vegetable (such as brown rice or a potato) on the final quarter of the plate. You can add a small piece of fruit and some low-fat or fat-free milk or yogurt, depending on your carbohydrate goal for each meal.  Here are some tips for using the plate format:  Make sure that you are not using an oversized plate. A 9-inch plate is best. Many restaurants use larger plates.  Get used to using the plate format at home. Then you can use it when you eat out.  Write down your questions about using the plate format. Talk to your doctor, a dietitian, or a diabetes educator about your concerns.  Carbohydrate counting  With carbohydrate counting, you plan meals based on the amount of carbohydrate in each food. Carbohydrate raises blood sugar higher and more quickly than any other nutrient. It is found in desserts, breads and cereals, and fruit. It's also found in starchy vegetables such as potatoes and corn, grains such as rice and pasta, and milk and yogurt. Spreading carbohydrate  throughout the day helps keep your blood sugar levels within your target range.  Your daily amount depends on several things, including your weight, how active you are, which diabetes medicines you take, and what your goals are for your blood sugar levels. A registered dietitian or diabetes educator can help you plan how much carbohydrate to include in each meal and snack.  A guideline for your daily amount of carbohydrate is:  45 to 60 grams at each meal. That's about the same as 3 to 4 carbohydrate servings.  15 to 20 grams at each snack. That's about the same as 1 carbohydrate serving.  The Nutrition Facts label on packaged foods tells you how much carbohydrate is in a serving of the food. First, look at the serving size on the food label. Is that the amount you eat in a serving? All of the nutrition information on a food label is based on that serving size. So if you eat more or less than that, you'll need to adjust the other numbers. Total carbohydrate is the next thing you need to look for on the label.  If you count carbohydrate servings, one serving of carbohydrate is 15 grams.  For foods that don't come with labels, such as fresh fruits and vegetables, you'll need a guide that lists carbohydrate in these foods. Ask your doctor, dietitian, or diabetes educator about books or other nutrition guides you can use.  If you take insulin, you need to know how many grams of carbohydrate are in a meal. This lets you know how much rapid-acting insulin to take before you eat. If you use an insulin pump, you get a constant rate of insulin during the day. So the pump must be programmed at meals to give you extra insulin to cover the rise in blood sugar after meals.  When you know how much carbohydrate you will eat, you can take the right amount of insulin. Or, if you always use the same amount of insulin, you need to make sure that you eat the same amount of carbohydrate at meals.  If you need more help to understand  carbohydrate counting and food labels, ask your doctor, dietitian, or diabetes educator.  How do you get started with meal planning?  Here are some tips to get started:  Plan your meals a week at a time. Don't forget to include snacks too.  Use cookbooks or online recipes to plan several main meals. Plan some quick meals for busy nights. You also can double some recipes that freeze well. Then you can save half for other busy nights when you don't have time to cook.  Make sure you have the ingredients you need for your recipes. If you're running low on basic items, put these items on your shopping list too.  List foods that you use to make breakfasts, lunches, and snacks. List plenty of fruits and vegetables.  Post this list on the refrigerator. Add to it as you think of more things you need.  Take the list to the store to do your weekly shopping.  Follow-up care is a key part of your treatment and safety. Be sure to make and go to all appointments, and call your doctor if you are having problems. It's also a good idea to know your test results and keep a list of the medicines you take.  Where can you learn more?  Go to http://clay[facility]-rivera.info/  Enter [phone] in the search box to learn more about "Learning About Meal Planning for Diabetes."  Current as of: May 01, 2018               Content Version: 12.6   2006-2020 [employer]wise, Incorporated.   Care instructions adapted under license by [facility] (which disclaims liability or warranty for this information). If you have questions about a medical condition or this instruction, always ask your healthcare professional. [place] any warranty or liability for your use of this information.

## 2022-06-26 NOTE — Telephone Encounter (Signed)
Patient was seen today but did not schedule a follow-up visit in 3 months with labs prior.  Please contact and schedule.

## 2022-06-26 NOTE — Progress Notes (Unsigned)
[name] Wu presents today for   Chief Complaint   Patient presents with    Blood Sugar Problem    Hyperglycemia       "Have you been to the ER, urgent care clinic since your last visit?  Hospitalized since your last visit?"    NO    "Have you seen or consulted any other health care providers outside of [facility] since your last visit?"    NO       Have you had a mammogram?"   NO

## 2022-06-26 NOTE — Progress Notes (Unsigned)
HPI:   [name] Wu is a 62 y.o. year old female who presents today for a routine visit.  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 She has been following with the [facility] in [place]. She reports today that she is doing reasonably well.  She expresses significant frustration in her diabetes control and states that she has been unable to obtain Trulicity or her NovoLog insulin from her new mail order company.  She acknowledges that she needed to stop Trulicity in 01/2022 since it became too expensive, but she states that it now will be covered but she has been unable to obtain it from her pharmacy.  She states that her blood sugars have been significantly elevated postprandially and she continues on Tresiba 40 units every evening.  She reports that her fasting morning blood sugars have been ranging from 150-190 and she does have a CGM. She states that she often will take as high as 30 units of sliding scale Humalog with each meal, but now reports that she has been without it for several weeks. She was referred to Pharm.D. Dail in 01/2022 to help with access to GLP-1 agonist therapy as well as blood sugar control given her worsening HbA1c, and she states that she is now scheduled on 07/08/2022.    She states today that she believes her peripheral neuropathy symptoms have gotten worse related to her elevated blood sugar.  She is continuing to take Cymbalta 30 mg twice daily and pregabalin 150 mg twice daily.    She discusses that her asthma control has significantly improved since beginning Qvar.  She also continues on Singulair and albuterol as needed.    She does describe ongoing difficulty with imbalance and intermittent falls.  She states that she was unable to proceed with balance therapy since her prior insurance did not cover it, but she  is hopeful that she will be able to proceed given her new insurance.  She requests that a new referral be placed today.      She is otherwise without new complaints.        Summary of prior hospitalizations and medical history:   She has a history of acute lymphoblastic leukemia, diagnosed in 02/2017 after experiencing severe fatigue and bone pain for several months. She was positive for the [place] chromosome mutation. She presented to the [facility] in [place] for care, and underwent chemotherapy which achieved remission. She subsequently underwent a stem cell transplant on 08/14/2017, felt to be curative. She is now on maintenance therapy with a tyrosine kinase inhibitor, nilotimib 200 mg bid. She is followed by Dr. Skeet Latch , transplant oncologist, and had been traveling to [facility] every three months for care. She had a follow-up visit with Dr. Karn Cassis in 12/2020 and treatment with nilotinib was discontinued and follow-up recommended on an annual basis.      He has a history of hypertension, treated with amlodipine. She has a history of hyperlipidemia, and was restarted on atorvastatin in 07/2019.  She also has a history of diabetes mellitus, currently being treated with Tresiba, Humalog, and Ozempic. She denies any polyuria, polydipsia, nocturia, or blurry vision, and has no history of retinopathy or nephropathy. 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 She was under the care of pain management and being treated with Cymbalta  and Roxicodone as needed, but has noted a significant decrease in the need for narcotics since being started on pregabalin in 02/2020.     She has a history of asthma since childhood, and is being maintained on Flovent and albuterol as needed. She denies any cough or shortness of breath.      In 04/2018, she was admitted to [facility] from 12/4-12/10/2017 for a UTI and then subsequently  readmitted from 12/6-12/17/2019 with MRSE bacteremia related to a PICC line and bibasilar pneumonia.      He has a history of GERD, treated with Protonix. She also underwent a screening colonoscopy on 05/14/2018 by Dr. Paulita Cradle at the [facility] in [place] to evaluate diarrhea post stem cell transplant.  No lesions were found. She denies any abdominal pain, nausea, vomiting, melena, hematochezia, or change in bowel movements.       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 TAKE ACCORDING TO SLIDING SCALE FOR BS  180-200 USE 2 UNITS, 201-22O USE 4 UNITS, 221-240 USE 5 UNITS, 241-280 USE 6 UNITS, 281-320 USE 7 UNITS, 321-400 USE 8 UNITS, GREATER THAN 400 CALL DOCTOR    lisinopril (PRINIVIL;ZESTRIL) 10 MG tablet Take 1 tablet by mouth daily    magnesium oxide (MAG-OX) 400 MG tablet Take 1 tablet by mouth 2 times daily    meclizine (ANTIVERT) 25 MG tablet TAKE 1 TABLET BY MOUTH THREE (3) TIMES DAILY AS NEEDED FOR DIZZINESS    montelukast (SINGULAIR) 10 MG tablet TAKE 1 TABLET BY MOUTH DAILY. INDICATIONS: CONTROLLER MEDICATION FOR ASTHMA    potassium chloride (KLOR-CON M) 20 MEQ extended release tablet Take 2 tablets by  mouth daily    acyclovir (ZOVIRAX) 400 MG tablet Take 2 tablets by mouth 2 times daily    cyclobenzaprine (FLEXERIL) 10 MG tablet TAKE 1 TABLET BY MOUTH THREE TIMES A DAY AS NEEDED FOR MUSCLE SPASMS    atorvastatin (LIPITOR) 20 MG tablet Take 1 tablet by mouth daily    beclomethasone (QVAR REDIHALER) 80 MCG/ACT AERB inhaler Inhale 2 puffs into the lungs in the morning and 2 puffs in the evening.    pregabalin (LYRICA) 150 MG capsule Take 1 capsule by mouth 2 times daily for 180 days. Max Daily Amount: 300 mg    omeprazole (PRILOSEC OTC) 20 MG tablet Take 1 tablet by mouth daily    Dulaglutide (TRULICITY) 3 MG/0.5ML SOPN Inject 3 mg into the skin once a week    Insulin Degludec (TRESIBA FLEXTOUCH) 200 UNIT/ML SOPN Inject 40 Units into the skin every evening  DULoxetine (CYMBALTA) 30 MG extended release capsule Take 1 capsule by mouth 2 times daily    Insulin Pen Needle 32G X 4 MM MISC 1 each by Does not apply route daily    BIOTIN PO Take 1 tablet by mouth daily    ZINC PO Take by mouth    albuterol sulfate HFA (PROVENTIL;VENTOLIN;PROAIR) 108 (90 Base) MCG/ACT inhaler Inhale 1 puff into the lungs every 4 hours as needed    albuterol (PROVENTIL) (2.5 MG/3ML) 0.083% nebulizer solution Inhale 3 mLs into the lungs every 4 hours as needed    vitamin D3 (CHOLECALCIFEROL) 125 MCG (5000 UT) TABS tablet Take 1 tablet by mouth daily    diphenhydrAMINE (BENADRYL) 25 MG capsule Take 1 capsule by mouth every 6 hours as needed    Specialty Vitamins Products (MAGNESIUM, AMINO ACID CHELATE,) 133 MG tablet Take 1 tablet by mouth 2 times daily    triamcinolone (KENALOG) 0.1 % cream APPLY TO AFFECTED AREA TWICE A DAY AS NEEDED FOR RASH    zolpidem (AMBIEN) 5 MG tablet Take 1 tablet by mouth.     No current facility-administered medications for this visit.     Allergies and Intolerances:   Allergies   Allergen Reactions    Penicillins Hives and Itching     Family History:  She has no family history of colon cancer of leukemia. Mother  had breast cancer at the age of 61.   Family History   Problem Relation Age of Onset    Breast Cancer Mother      Social History:   She  reports that she has never smoked. She has never used smokeless tobacco. She is divorced and has no children. She is a Research scientist (medical), and working virtually due to the pandemic.     Social History     Substance and Sexual Activity   Alcohol Use No     Immunization History:  Immunization History   Administered Date(s) Administered    COVID-19, MODERNA BLUE border, Primary or Immunocompromised, (age 12y+), IM, 100 mcg/0.67mL 06/16/2019, 07/14/2019    COVID-19, MODERNA Bivalent, (age 12y+), IM, 50 mcg/0.5 mL 09/13/2021    COVID-19, MODERNA, (2023-24 formula), (age 12y+), IM, 34mcg/0.5mL 02/18/2022    COVID-19, PFIZER Bivalent, DO NOT Dilute, (age 12y+), IM, 30 mcg/0.3 mL 01/29/2021    COVID-19, PFIZER PURPLE top, DILUTE for use, (age 59 y+), 11mcg/0.3mL 03/12/2020, 10/22/2020    Influenza Trivalent 02/27/2018, 02/23/2019, 03/01/2020    Influenza Virus Vaccine 02/18/2022    Influenza, FLUARIX, FLULAVAL, FLUZONE (age 22 mo+) AND AFLURIA, (age 222 y+), PF, 0.23mL 01/23/2021    Pneumococcal, PCV20, PREVNAR 20, (age 22w+), IM, 0.13mL 01/23/2021    Pneumococcal, PPSV23, PNEUMOVAX 23, (age 2y+), SC/IM, 0.76mL 03/18/2019    TDaP, ADACEL (age 16y-64y), Leda Min (age 10y+), IM, 0.75mL 03/18/2019    Zoster Recombinant (Shingrix) 09/13/2021       Review of Systems:   As above included in HPI.  Otherwise 11 point review of systems negative including constitutional, skin, HENT, eyes, respiratory, cardiovascular, gastrointestinal, genitourinary, musculoskeletal, endocrine, hematologic, allergy, and neurologic.    Physical:   Vitals:    06/26/22 1338 06/26/22 1410   BP: (!) 158/89 118/78   Pulse: 87    Resp: 16    Temp: 98.4 F (36.9 C)    TempSrc: Temporal    SpO2: 96%    Weight: 94.3 kg (208 lb)    Height: 1.727 m (5\' 8" )        Exam:   Patient  appears in no apparent distress. Affect is appropriate.    HEENT:  PERRLA, anicteric, oropharynx clear, no JVD, adenopathy or thyromegaly. No carotid bruits or radiated murmur.  Lungs: clear to auscultation, no wheezes, rhonchi, or rales.  Heart: regular rate and rhythm. No murmur, rubs, gallops  Abdomen: soft, nontender, nondistended, normal bowel sounds, no hepatosplenomegaly or masses.   Extremities: without edema.      Diabetic foot exam:   Left Foot:   Visual Exam: normal   Pulse DP: 2+ (normal)   Filament test: normal sensation   Vibratory Sensation: normal  Right Foot:   Visual Exam: normal   Pulse DP: 2+ (normal)   Filament test: normal sensation   Vibratory Sensation: normal        Review of Data:  Labs:  Orders Only on 06/25/2022   Component Date Value Ref Range Status    WBC 06/25/2022 9.0  3.4 - 10.8 x10E3/uL Final    RBC 06/25/2022 4.60  3.77 - 5.28 x10E6/uL Final    Hemoglobin 06/25/2022 14.6  11.1 - 15.9 g/dL Final    Hematocrit 19/14/7829 42.6  34.0 - 46.6 % Final    MCV 06/25/2022 93  79 - 97 fL Final    MCH 06/25/2022 31.7  26.6 - 33.0 pg Final    MCHC 06/25/2022 34.3  31.5 - 35.7 g/dL Final    RDW 56/21/3086 13.0  11.7 - 15.4 % Final    Platelets 06/25/2022 226  150 - 450 x10E3/uL Final    Neutrophils % 06/25/2022 60  Not Estab. % Final    Lymphocytes % 06/25/2022 34  Not Estab. % Final    Monocytes % 06/25/2022 5  Not Estab. % Final    Eosinophils % 06/25/2022 1  Not Estab. % Final    Basophils % 06/25/2022 0  Not Estab. % Final    Neutrophils Absolute 06/25/2022 5.4  1.4 - 7.0 x10E3/uL Final    Lymphocytes Absolute 06/25/2022 3.0  0.7 - 3.1 x10E3/uL Final    Monocytes Absolute 06/25/2022 0.4  0.1 - 0.9 x10E3/uL Final    Eosinophils Absolute 06/25/2022 0.1  0.0 - 0.4 x10E3/uL Final    Basophils Absolute 06/25/2022 0.0  0.0 - 0.2 x10E3/uL Final    Immature Granulocytes 06/25/2022 0  Not Estab. % Final    Immature Grans (Abs) 06/25/2022 0.0  0.0 - 0.1 x10E3/uL Final    Glucose 06/25/2022 207 (H)  70 - 99 mg/dL Final    BUN 57/84/6962 14  8 - 27 mg/dL Final     Creatinine 95/28/4132 0.97  0.57 - 1.00 mg/dL Final    Est, Glomerular Filtration Rate 06/25/2022 66  >59 mL/min/1.73 Final    BUN/Creatinine Ratio 06/25/2022 14  12 - 28 Final    Sodium 06/25/2022 143  134 - 144 mmol/L Final    Potassium 06/25/2022 4.6  3.5 - 5.2 mmol/L Final    Chloride 06/25/2022 101  96 - 106 mmol/L Final    CO2 06/25/2022 27  20 - 29 mmol/L Final    Calcium 06/25/2022 9.4  8.7 - 10.3 mg/dL Final    Total Protein 06/25/2022 6.4  6.0 - 8.5 g/dL Final    Albumin 44/05/270 4.1  3.9 - 4.9 g/dL Final    Globulin, Total 06/25/2022 2.3  1.5 - 4.5 g/dL Final    Albumin/Globulin Ratio 06/25/2022 1.8  1.2 - 2.2 Final    Total Bilirubin 06/25/2022 0.5  0.0 - 1.2 mg/dL Final    Alkaline Phosphatase 06/25/2022  121  44 - 121 IU/L Final    AST 06/25/2022 23  0 - 40 IU/L Final    ALT 06/25/2022 23  0 - 32 IU/L Final    Cholesterol 06/25/2022 157  100 - 199 mg/dL Final    Triglycerides 06/25/2022 157 (H)  0 - 149 mg/dL Final    HDL 46/96/2952 48  >39 mg/dL Final    VLDL Cholesterol Calculated 06/25/2022 27  5 - 40 mg/dL Final    LDL Calculated 06/25/2022 82  0 - 99 mg/dL Final    Hemoglobin W4X 06/25/2022 9.8 (H)  4.8 - 5.6 % Final    Specimen Status Report 06/25/2022 COMMENT   Final    Creatinine, Ur 06/25/2022 67.4  Not Estab. mg/dL Final    Albumin, U 32/44/0102 <3.0  Not Estab. ug/mL Final    Microalbumin Creatinine Ratio 06/25/2022 <4  0 - 29 mg/g creat Final          Health Maintenance:  Screening:               Mammogram: Right mammo/ultrasound negative (10/2020); bilateral due in 01/2021  OVERDUE              PAP smear: well woman exam with NP Daphane Shepherd (Pap/HPV negative on 02/15/2020)              Colorectal: colonoscopy (05/14/2018) normal.  Dr. Paulita Cradle in [place].  Due 05/2028.              Depression: none              DM (HbA1c/FPG): HbA1c 11.4 (01/2022)              Hepatitis C: negative (03/2019)              Falls: none              DEXA: unknown              Glaucoma: regular eye exams with Dr. Octavio Graves  (last 10/2020) OVERDUE              Smoking: none              Vitamin D: 37.9 (01/2022)              Medicare Wellness: today           Impression:  Patient Active Problem List   Diagnosis    Peripheral neuropathy due to chemotherapy (HCC)    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HCC)    Essential hypertension    Type 2 diabetes mellitus, with long-term current use of insulin (HCC)    S/P bone marrow transplant (HCC)    Eczema    Elevated alkaline phosphatase level    Primary osteoarthritis involving multiple joints    Insomnia    GERD (gastroesophageal reflux disease)    Vestibular neuronitis    Hyperlipidemia    Class 1 obesity due to excess calories with serious comorbidity in adult    Imbalance    Mild intermittent asthma       Plan:  1. Diabetes mellitus. Previously well controlled on regimen of Tresiba and SS Humalog with HbA1c 6.5 in 03/2019. However, increased to HbA1c 7.1 in 06/2019 and 9.2 in 04/2020 likely related to weight gain.  Now prescribed Freestyle CGM to help with close monitoring of blood sugars  Started on Ozempic in 04/2020 and continued on Tresiba and sliding scale Humalog.  Repeat HbA1c improved to  7.3 in 01/2021 and dose of Ozempic increased to 2 mg weekly.  However, changed to Trulicity 3 mg weekly due to availability issues.  Repeat HbA1c increased to 7.8 (05/2021) and patient admitted to dietary noncompliance.  Lost to follow-up until 01/2022 and HbA1c now 11.4.  Patient reports no longer able to obtain GLP-1 agonist due to cost and on Tresiba 36 units nightly with sliding scale Humalog.  Referral placed to Pharm.D. Dail to help with obtaining GLP-1 agonist and with blood sugar control.  Advised to increase dose of Tresiba to 40 units daily and continue with sliding scale Humalog.  No evidence of microvascular complications. On statin and on lisinopril since 04/2020.  Continue regular eye exams with Dr. Octavio Graves.  Overdue and urged to schedule.  Foot exam grossly normal (today), and no  evidence of microalbuminuria with urine microalbumin/ creatinine ratio 12 mg/g (01/2021).  Will reassess at next visit.  Emphasized importance of lifestyle modifications, including low carbohydrate diet, regular exercise, and weight loss.  Will reassess HbA1c at next visit.  2. Acute lymphoblastic leukemia, +[place] chromosone mutation, s/p stem cell transplant. Diagnosed in 02/2017 and underwent induction chemotherapy followed by stem cell transplant in 08/2017.  Had been on maintenance therapy with a TKI (nilotinib), but discontinued in 12/2020 by her oncologist. Being followed closely by Dr. Skeet Latch, transplant oncologist, at [facility] in [place].  Reported frequency of follow-up had been decreased to annually with next scheduled visit to [facility] in 12/2022.  CBC remains normal today.  Will continue to monitor.  3. Hypertension. Blood pressure remains well controlled on lisinopril 10 mg daily. Renal function normalized today at 1.02/eGFR > 60.  On potassium and magnesium supplements due to long term difficulty with deficiency and levels have been stable. Will continue to monitor.  4. Hyperlipidemia.  On moderate intensity dose atorvastatin with LDL 78 and HDL 47, indicative of good control. Emphasized importance of lifestyle modifications, including heart healthy diet, regular exercise, and weight loss. Continue to follow.  5. Peripheral neuropathy, painful. Secondary to chemotherapy used to treat ALL. Did not tolerate gabapentin due to hallucinations. On Cymbalta 30 mg bid and was receiving Roxicodone through pain management.  However, weaned from Roxicodone in 02/2020 and started on pregabalin 75 mg twice daily in 02/2021 and finding it to be very effective.  In 05/2021, reported increased symptoms at night requiring use of ibuprofen and dose of pregabalin increased to 150 mg twice daily with good response. Advised to continue using Flexeril as needed for cramping. Continue to  monitor.  6. Asthma, mild intermittent.  Noting significant improvement since initiation of Singulair 10 mg daily following exacerbation in 03/2021.  Also on Flovent for maintenance therapy and albuterol as needed.  Will change Flovent to Qvar today given formulary change by insurance.  Ordered nebulizer and unclear if patient has yet received.  Will readdress at next visit.  7. GERD. On Protonix 40 mg bid with reasonably good control. Follow.  8. Abnormal mammogram. Patient reports had previous biopsy of right breast which was benign. Screening mammogram (02/2019) showed multiple abnormalities in right breast. Right breast diagnostic mammogram and ultrasound (05/12/2019) showed probably benign findings, but recommendation was to proceed with 6 month follow-up. Underwent repeat diagnostic right mammogram and right breast ultrasound in 10/2020 and findings felt to be benign. Recommended continuing with annual follow-up and due for bilateral screening in 02/2021.  Order again placed today and urged to schedule.  Patient with FH history of breast  cancer in her mother.  9. Elevated alkaline phosphatase. GGT elevated today at 97 so likely of hepatic origin. AMA negative (04/2020) and alkaline phosphatase isozymes with normal differential and predominant hepatic origin (04/2020).  Right upper quadrant ultrasound ordered but patient never obtained. Repeat alkaline phosphatase normalized to 107 in 01/2021 but mildly elevated today at 131.  Suspect improvement related to discontinuation of nilotinib.  Will continue to monitor.  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  Reported increasing difficulty with vertigo and stated that she had experienced multiple falls. Continuing difficulty with disequilibrium.  Referred to balance therapy in 01/2021 and again in 05/2021 but never scheduled. Advised use of meclizine as needed.  Reports overall improvement today and has not had recurrent falls.    11. Insomnia. Using Ambien +/- Benadryl as needed.  Emphasized good sleep hygiene.  Continue to monitor.  12. Eczema. On Xyzal and will use Kenalog as needed.   13. Left trigger thumb. Evaluated by Dr. Andrey Campanile in 04/2019 and received a cortisone injection with improvement.  Currently stable.  14.  Obesity. Weight had increased nearly 15 pounds since 12/2019.  Started treatment with Ozempic to help with weight loss and diabetes control as discussed, but no longer able to obtain due to cost. Emphasized importance of continuing with attempts lifestyle modifications, including heart healthy low carbohydrate diet, regular exercise, and weight loss.  15. Health maintenance. Completed 4 doses of the COVID 19 vaccine series due to immunosuppressed status and the bivalent Pfizer booster dose.  Received Prevnar 20.  Advised to obtain the Shingrix vaccine. Discussed recommended vaccines for the fall, including influenza vaccine, updated COVID vaccine, and new RSV vaccine.  Will give influenza vaccine today.  Other immunizations up-to-date.  Bilateral mammogram due in 02/2021 and still has not yet scheduled. Order again placed today and discussed obtaining baseline bone density study as well.  Will readdress at next visit. Colonoscopy completed in [place] on 05/14/2018.  Normal so 10-year follow-up recommended.  Completed well woman exam with NP Elder Negus and chart updated.  Also completed eye exam with Dr. Octavio Graves.  Urged to continue with annual exams.  Vitamin D level remains normal on maintenance dose supplement. Emphasized importance of lifestyle modifications, including heart healthy diet, regular exercise, and weight loss. Medicare wellness visit up-to-date.      Patient understands recommendations and agrees with plan.  Follow-up in 3 months.

## 2022-06-27 LAB — COMPLIANCE DRUG ANALYSIS, UR

## 2022-07-06 ENCOUNTER — Encounter

## 2022-07-08 ENCOUNTER — Ambulatory Visit: Admit: 2022-07-08 | Discharge: 2022-07-08 | Payer: MEDICARE | Primary: Internal Medicine

## 2022-07-08 MED ORDER — MOUNJARO 5 MG/0.5ML SC SOPN
50.5 MG/0.ML | SUBCUTANEOUS | 0 refills | Status: AC
Start: 2022-07-08 — End: 2022-08-02

## 2022-07-08 MED ORDER — TRESIBA FLEXTOUCH 200 UNIT/ML SC SOPN
200 UNIT/ML | Freq: Every evening | SUBCUTANEOUS | 1 refills | Status: DC
Start: 2022-07-08 — End: 2022-08-07

## 2022-07-08 NOTE — Telephone Encounter (Signed)
Last ov: 06/26/2022  Next ov: 10/09/2022   Last refill: 06/26/2022-refuse refill

## 2022-07-08 NOTE — Progress Notes (Signed)
Pharmacy Progress Note - Diabetes Management       Assessment / Plan:   Diabetes Management:  Per ADA guidelines, [name] A1c is not at goal of < 7%.  Pt has a severe lack of basal control.  Will increase her Tresiba to 54 units qhs.  Her prandial control is highly variable d/t the erratic Humalog dosing with and without eating.  She is not following her established sliding scale at this time.  She does not have a set amount of Humalog to be given with each meal leading to post-prandial elevations not controlled by the sliding scale.  She was previously relying on the Trulicity to cover the initial elevation from her meals.  Will now be utilizing Mounjaro '5mg'$  weekly to attempt to control her post-prandial elevations from her meals.  Will maintain her existing Humalog SSI to correct with up to 10 units > 400 mg/dL to account for the elevations not controlled by [facility].  The plan will be to titrate the dose of Mounjaro which should further improve its efficacy for prandial control.  Will reassess with CGM data in 3 weeks.    Hyperlipidemia:  The 10-year ASCVD risk score (Arnett DK, et al., 2019) is: 11.5% based on parameters listed. Current lipid treatment guidelines recommend at least moderate-intensity statin doses for all patients with diabetes to decrease overall ASCVD risk. Patient currently qualifies for a moderate intensity statin therapy based on current recommendations and is currently taking a moderate intensity statin.      Nutrition/Lifestyle Modifications:  - Educated pt on the importance of moderating carbohydrate intake. Reviewed sources of carbohydrates and method to help determine appropriate portion sizes (e.g., Diabetes Plate Method).  - Advised patient to avoid sugar-sweetened beverages and replace with water or diet/zero sugar option.  - Recommend ~30 minutes consistent, moderately intensive, exercise/day or ~150 minutes/week. Start small, stay consistent, and increase length and types of exercise,  as tolerated.       Patient will return to clinic in 3 week(s) for follow up.        S/O: [name] Wu, a 62 y.o. 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  Pt was seen today for diabetes management.  Patient's last A1c was:   Hemoglobin A1C   Date Value Ref Range Status   06/25/2022 9.8 (H) 4.8 - 5.6 % Final     Comment:                 Prediabetes: 5.7 - 6.4           Diabetes: >6.4           Glycemic control for adults with diabetes: <7.0         Interim update:    Pt was last seen by Blanch Media, MD on 06/26/22.  Per her note: Previously well controlled on regimen of Tresiba and SS Humalog with HbA1c 6.5 in 03/2019. However, increased to HbA1c 7.1 in 06/2019 and 9.2 in 04/2020 likely related to weight gain.  Now prescribed Freestyle CGM to help with close monitoring of blood sugars  Started on Ozempic in 04/2020 and continued on Tresiba and sliding scale Humalog.  Repeat HbA1c improved to 7.3 in 01/2021 and dose of Ozempic  increased to 2 mg weekly.  Changed to Trulicity 3 mg weekly due to availability issues, and repeat HbA1c increased to 7.8 (05/2021) and patient admitted to dietary noncompliance. Lost to follow-up until 01/2022 and HbA1c increased to 11.4 due to patient no longer being able to obtain GLP-1 agonist due to cost. Referral placed to Pharm.D. Genesi Stefanko to help with obtaining GLP-1 agonist and with diabetes management, but patient did not keep appointment.  Now scheduled on 07/08/2022.  HbA1c today improved to 9.8 on Tresiba 40 units daily but fasting blood sugar elevated at 150-190. Advised to titrate Tresiba dose by 2 units every 2-3 days in order to achieve a  fasting blood sugar of 100-110. Discussed use of Humalog and how requirements should decrease as Tresiba dose titrated for better basal control. Will send refill for Humalog and Trulicity to local pharmacy to see if may obtain while awaiting mail order. No evidence of microvascular complications. On statin and on lisinopril since 04/2020. Continue regular eye exams with Dr. Faustino Congress.  Overdue and urged to schedule.  Foot exam grossly normal (02/2022), and no evidence of microalbuminuria with urine microalbumin/ creatinine ratio <4 mg/g. Emphasized importance of lifestyle modifications, including low carbohydrate diet, regular exercise, and weight loss.  Will reassess HbA1c at next visit.     Today:   Pt has not had her Trulicity since prior to her Sep 2023 appointment.  This was d/t insurance coverage and medication shortage.  Mounjaro gone over as an alternative agent and she is amenable to the switch.  Advised would need to restart her dose titration to decrease risk of severe GI SE.  PA submitted through CMM: [name] Wu (Key: [facility]) - [id number]  [name] Wu approved.    She has been giving 16 units or more of Humalog.  She has given herself as much as 25 units of Humalog.  She gave herself 25 units last night with a banana.  Educated on the dangers of injecting this much Humalog and advised to stick with her existing sliding scale.  She has been injecting her Humalog outside of meals as well to correction for elevations resulting in hyperglycemia alerts from her [place].    Diabetes education provided today:    Diabetic Neuropathy: signs and therapy.  Diabetic nephropathy: Kidney function, microalbumin as a sign of diabetic nephropathy. Stages of kidney disease.  Nutrition as a mainstream of diabetes therapy. Learned about label reading.  Physical activity: advised to exercise 5-7 days a week 30-60 mins at least. Discussed how it affects BS readings.  Different diabetic medications.  Managing high and low  sugar readings.    Current anti-hyperglycemic regimen includes:    Key Antihyperglycemic Medications               insulin lispro, 1 Unit Dial, (HUMALOG KWIKPEN) 100 UNIT/ML SOPN (Taking) CHECK BLOOD GLUCOSE PRIOR TO MEALS AND AT BEDTIME. TAKE ACCORDING TO SLIDING SCALE FOR BS 180-200 USE 2 UNITS, 201-22O USE 4 UNITS, 221-240 USE 5 UNITS, 241-280 USE 6 UNITS, 281-320 USE 7 UNITS, 321-400 USE 8 UNITS, GREATER THAN 400 CALL DOCTOR    Insulin Degludec (TRESIBA FLEXTOUCH) 200 UNIT/ML SOPN (Taking) Inject 40 Units into the skin every evening    insulin lispro, 0.5 Unit Dial, 100 UNIT/ML SOPN CHECK BLOOD GLUCOSE PRIOR TO MEALS AND AT BEDTIME. TAKE ACCORDING TO SLIDING SCALE FOR BS  180-200 USE 2 UNITS, 201-22O USE 4 UNITS, 221-240 USE 5 UNITS, 241-280 USE 6 UNITS, 281-320 USE 7 UNITS, 321-400 USE 8  UNITS, GREATER THAN 400 CALL DOCTOR    Dulaglutide (TRULICITY) 3 0000000 SOPN Inject 3 mg into the skin once a week          Complete current medication regimen includes:  Current Outpatient Medications   Medication Sig    insulin lispro, 1 Unit Dial, (HUMALOG KWIKPEN) 100 UNIT/ML SOPN CHECK BLOOD GLUCOSE PRIOR TO MEALS AND AT BEDTIME. TAKE ACCORDING TO SLIDING SCALE FOR BS 180-200 USE 2 UNITS, 201-22O USE 4 UNITS, 221-240 USE 5 UNITS, 241-280 USE 6 UNITS, 281-320 USE 7 UNITS, 321-400 USE 8 UNITS, GREATER THAN 400 CALL DOCTOR    Insulin Degludec (TRESIBA FLEXTOUCH) 200 UNIT/ML SOPN Inject 40 Units into the skin every evening (Patient taking differently: Inject 46 Units into the skin every evening)    insulin lispro, 0.5 Unit Dial, 100 UNIT/ML SOPN CHECK BLOOD GLUCOSE PRIOR TO MEALS AND AT BEDTIME. TAKE ACCORDING TO SLIDING SCALE FOR BS  180-200 USE 2 UNITS, 201-22O USE 4 UNITS, 221-240 USE 5 UNITS, 241-280 USE 6 UNITS, 281-320 USE 7 UNITS, 321-400 USE 8 UNITS, GREATER THAN 400 CALL DOCTOR (Patient not taking: Reported on 07/08/2022)    lisinopril (PRINIVIL;ZESTRIL) 10 MG tablet Take 1 tablet by mouth daily    magnesium oxide  (MAG-OX) 400 MG tablet Take 1 tablet by mouth 2 times daily    meclizine (ANTIVERT) 25 MG tablet TAKE 1 TABLET BY MOUTH THREE (3) TIMES DAILY AS NEEDED FOR DIZZINESS    montelukast (SINGULAIR) 10 MG tablet TAKE 1 TABLET BY MOUTH DAILY. INDICATIONS: CONTROLLER MEDICATION FOR ASTHMA    potassium chloride (KLOR-CON M) 20 MEQ extended release tablet Take 2 tablets by mouth daily    acyclovir (ZOVIRAX) 400 MG tablet Take 2 tablets by mouth 2 times daily    cyclobenzaprine (FLEXERIL) 10 MG tablet TAKE 1 TABLET BY MOUTH THREE TIMES A DAY AS NEEDED FOR MUSCLE SPASMS    atorvastatin (LIPITOR) 20 MG tablet Take 1 tablet by mouth daily    beclomethasone (QVAR REDIHALER) 80 MCG/ACT AERB inhaler Inhale 2 puffs into the lungs in the morning and 2 puffs in the evening.    pregabalin (LYRICA) 150 MG capsule Take 1 capsule by mouth 2 times daily for 180 days. Max Daily Amount: 300 mg    omeprazole (PRILOSEC OTC) 20 MG tablet Take 1 tablet by mouth daily    Dulaglutide (TRULICITY) 3 0000000 SOPN Inject 3 mg into the skin once a week (Patient not taking: Reported on 07/08/2022)    DULoxetine (CYMBALTA) 30 MG extended release capsule Take 1 capsule by mouth 2 times daily    Insulin Pen Needle 32G X 4 MM MISC 1 each by Does not apply route daily    BIOTIN PO Take 1 tablet by mouth daily    ZINC PO Take by mouth    albuterol sulfate HFA (PROVENTIL;VENTOLIN;PROAIR) 108 (90 Base) MCG/ACT inhaler Inhale 1 puff into the lungs every 4 hours as needed    albuterol (PROVENTIL) (2.5 MG/3ML) 0.083% nebulizer solution Inhale 3 mLs into the lungs every 4 hours as needed    vitamin D3 (CHOLECALCIFEROL) 125 MCG (5000 UT) TABS tablet Take 1 tablet by mouth daily    diphenhydrAMINE (BENADRYL) 25 MG capsule Take 1 capsule by mouth every 6 hours as needed    Specialty Vitamins Products (MAGNESIUM, AMINO ACID CHELATE,) 133 MG tablet Take 1 tablet by mouth 2 times daily    triamcinolone (KENALOG) 0.1 % cream APPLY TO AFFECTED AREA TWICE A DAY AS NEEDED FOR  RASH    zolpidem (AMBIEN) 5 MG tablet Take 1 tablet by mouth.     No current facility-administered medications for this visit.     Allergies:  Allergies   Allergen Reactions    Penicillins Hives and Itching       Blood Glucose Monitoring (BGM) or CGM:      ROS:  Today, Pt endorses:  - Symptoms of Hyperglycemia: none  - Symptoms of Hypoglycemia: none    Lifestyle modification(s):  - none    Physical Activity:   None    Medication Adherence/Access:  - Brought in home medications including prescription/non-prescriptions: No  - Endorses barriers to affording/accessing medications : No  - Uses a pill box/other methods to organize medications: Yes  - Endorses adherence to current regimen?: No    Vitals/Labs:  Wt Readings from Last 3 Encounters:   06/26/22 94.3 kg (208 lb)   02/20/22 93.4 kg (206 lb)   01/23/21 95.7 kg (211 lb)     BP Readings from Last 3 Encounters:   06/26/22 118/78   02/20/22 121/79   01/23/21 134/80     Pulse Readings from Last 3 Encounters:   06/26/22 87   02/20/22 86   01/23/21 70       Lab Results   Component Value Date/Time    NA 143 06/25/2022 12:00 AM    K 4.6 06/25/2022 12:00 AM    CL 101 06/25/2022 12:00 AM    CO2 27 06/25/2022 12:00 AM    BUN 14 06/25/2022 12:00 AM    GFRAA >60 01/12/2021 11:49 AM    GLOB 3.0 05/08/2022 01:28 PM    ALT 23 06/25/2022 12:00 AM     Lab Results   Component Value Date/Time    CHOL 157 06/25/2022 12:00 AM    CHOL 143 05/08/2022 01:28 PM    HDL 48 06/25/2022 12:00 AM     Lab Results   Component Value Date/Time    WBC 9.0 06/25/2022 12:00 AM    HGB 14.6 06/25/2022 12:00 AM    HCT 42.6 06/25/2022 12:00 AM    PLT 226 06/25/2022 12:00 AM    MCV 93 06/25/2022 12:00 AM     No results found for: "HBA1C"  Hemoglobin A1C   Date Value Ref Range Status   06/25/2022 9.8 (H) 4.8 - 5.6 % Final     Comment:                 Prediabetes: 5.7 - 6.4           Diabetes: >6.4           Glycemic control for adults with diabetes: <7.0         Screenings/Prevention Parameters:  -Diabetic  Eye and Foot Exams:      Diabetes Management   Topic Date Due    Diabetic retinal exam  10/20/2021         Diabetic Foot Exam HM Status   Topic Date Due    Diabetic Foot Exam  02/21/2023      -Microalbumin / Creatinine ratio:       Lab Results   Component Value Date/Time    MICROALBUR 1.51 01/23/2021 08:00 AM    LABCREA 67.4 06/25/2022 12:00 AM     -ASCVD Risk Score Parameters and Calculation    Race: Black / African American     BP Readings from Last 3 Encounters:   06/26/22 118/78   02/20/22 121/79   01/23/21 134/80  Lab Results   Component Value Date    CHOL 157 06/25/2022    CHOL 143 05/08/2022    CHOL 152 01/21/2022     Lab Results   Component Value Date    TRIG 157 (H) 06/25/2022    TRIG 159 (H) 05/08/2022    TRIG 135 01/21/2022     Lab Results   Component Value Date    HDL 48 06/25/2022    HDL 46 05/08/2022    HDL 47 01/21/2022     Lab Results   Component Value Date    LDLCALC 82 06/25/2022    LDLCALC 65.2 05/08/2022    LDLCALC 78 01/21/2022        Social History     Tobacco Use    Smoking status: Never    Smokeless tobacco: Never   Substance Use Topics    Alcohol use: No         Calculated ASCVD Risk Score: The 10-year ASCVD risk score (Arnett DK, et al., 2019) is: 11.5%    Values used to calculate the score:      Age: 25 years      Sex: Female      Is Non-Hispanic African American: Yes      Diabetic: Yes      Tobacco smoker: No      Systolic Blood Pressure: 123456 mmHg      Is BP treated: Yes      HDL Cholesterol: 48 mg/dL      Total Cholesterol: 157 mg/dL    -Statin Safety Parameters:      Lab Results   Component Value Date    ALT 23 06/25/2022    AST 23 06/25/2022    ALKPHOS 121 06/25/2022    BILITOT 0.5 06/25/2022     -Immunizations:      Immunization History   Administered Date(s) Administered    COVID-19, MODERNA BLUE border, Primary or Immunocompromised, (age 55y+), IM, 100 mcg/0.4m 06/16/2019, 07/14/2019    COVID-19, MODERNA Bivalent, (age 55y+), IM, 50 mcg/0.5 mL 09/13/2021    COVID-19, MODERNA,  (2023-24 formula), (age 55y+), IM, 519m/0.5mL 02/18/2022    COVID-19, PFIZER Bivalent, DO NOT Dilute, (age 55y+), IM, 30 mcg/0.3 mL 01/29/2021    COVID-19, PFIZER PURPLE top, DILUTE for use, (age 62+), 3036m0.3mL 03/12/2020, 10/22/2020    Influenza Trivalent 02/27/2018, 02/23/2019, 03/01/2020    Influenza Virus Vaccine 02/18/2022    Influenza, FLUARIX, FLULAVAL, FLUZONE (age 59 m46+) AND AFLURIA, (age 21 y66), PF, 0.5mL90m/13/2022    Pneumococcal, PCV20, PREVNAR 20, 46ge 6w+), IM, 0.5mL 66m13/2022    Pneumococcal, PPSV23, PNEUMOVAX 23, (age 2y+), SC/IM, 0.5mL 138m5/2020    TDaP, ADACEL (age 10y-642y-64ySTRIX (age 10y+), IM, 0.5mL 1116m/2020    Zoster Recombinant (Shingrix) 09/13/2021       Additional Laboratory Parameters of Interest:   Estimation of renal function:  Lab Results   Component Value Date/Time    GFRAA >60 01/12/2021 11:49 AM    GFRAA >60 11/22/2020 11:12 AM    GFRAA >60 08/10/2020 03:38 PM     Wt Readings from Last 3 Encounters:   06/26/22 94.3 kg (208 lb)   02/20/22 93.4 kg (206 lb)   01/23/21 95.7 kg (211 lb)     Ht Readings from Last 1 Encounters:   06/26/22 1.727 m ('5\' 8"'$ )     Calculated estimated creatinine clearance: estimated creatinine clearance is 73 mL/min (based on SCr of 0.97 mg/dL).    Vital Signs Today:    There  were no vitals taken for this visit.    Medications Discontinued During This Encounter   Medication Reason    Dulaglutide (TRULICITY) 3 0000000 SOPN Formulary change    Insulin Degludec (TRESIBA FLEXTOUCH) 200 UNIT/ML SOPN DOSE ADJUSTMENT       Orders Placed This Encounter    Tirzepatide (MOUNJARO) 5 MG/0.5ML SOPN SC injection     Sig: Inject 0.5 mLs into the skin once a week     Dispense:  2 mL     Refill:  0    Insulin Degludec (TRESIBA FLEXTOUCH) 200 UNIT/ML SOPN     Sig: Inject 54 Units into the skin every evening     Dispense:  18 mL     Refill:  1       Future Appointments   Date Time Provider [place]   07/29/2022  1:30 PM Jackquline [place], [facility] BS AMB    09/24/2022 11:15 AM IOC LAB VISIT  BS AMB   10/09/2022  2:30 PM Sarris, Royston Cowper, MD  BS AMB       Patient verbalized understanding of the information presented and all of the patient's questions were answered.  AVS was handed to the patient. Patient advised to call the office with any additional questions or concerns.    Notifications of recommendations will be sent to Blanch Media, MD for review.      Thank you for the consult,  Johnston Ebbs, PharmD, BCACP, BC-ADM        For Pharmacy Admin Tracking Only    Program: Medical Group  CPA in place:  Yes  Recommendation Provided To: Patient/Caregiver: 6 via In person  Intervention Detail: Adherence Monitoring: 1, Benefit Assistance, Device Training, Discontinued Rx: 1, reason: Cost/Formulary Change, Dose Adjustment: 1, reason: Therapy Optimization, and New Rx: 1, reason: Needs Additional Therapy  Intervention Accepted By: Patient/Caregiver: 6  Gap Closed?: No   Time Spent (min): 60

## 2022-07-08 NOTE — Telephone Encounter (Signed)
Last ov: 06/26/2022  Next ov: 10/09/2022  Last refill: 06/26/2022-refuse refill

## 2022-07-08 NOTE — Patient Instructions (Addendum)
Your Visit Summary:     Plan:  - Increase Tresiba to 54 units every night    - Start Mounjaro 56m every week    - Max 10 units of Humalog > 400 mg/dL      Call me with any questions or concerns  MJohnston Ebbs([phone]   Your blood sugar goals:  - Fasting (first thing in the morning)  blood sugar: 80 - 1334mdL  - 2 hours after a meal: 80 - 18053mL    When you experience symptoms of low blood sugar (example: less than 70):  - Confirm low reading by checking your blood sugar.   - Then treat with 15 grams of carbohydrates (one-half cup of juice or regular soda, or 4-5 glucose tablets).  - Wait 15 minutes to recheck blood sugar.   - Then eat a protein containing meal/snack to prevent another low blood sugar episode. (example: peanut butter + crackers)    Nutrition:  - When reviewing a nutrition label, focus on the serving size, total calories, fat (and type of fats), total carbohydrates, sugar (and amount of added sugar), amount of fiber (good for your digestive), and amount of protein.  Refer to your nutrition label guide for more information.  - For a meal : max 45 - 60 grams of carbohydrates  - For a snack: max 15 grams of carbohydrates  - Reduce amount of saturated and trans fat.  Consider more unsaturated fat options as they are better for your heart health.   - Have at least 1 serving of lean fat protein with each meal.    - Increase fiber intake slowly to prevent constipation.   - Substitute fruit juices for the whole fruit    Low carb snack ideas (15 grams total carb or less):    String cheese or babybel with 6 crackers  4 peanut butter crackers  3 cups of popcorn  1 cup raw vegetables with hummus or ranch dip (just need to watch how much dip you use)  Nuts  2 rice cakes  Celery with peanut butter or cream cheese  String cheese with 1 serving of fruit  Greek yogurt (look at label to make sure < 15 gram carb)  Plain greek yogurt with fresh berries added  [employer] [place] protein bar  Whisps parmesan cheese  crisps  Hard boiled egg  Cottage cheese  Tuna salad lettuce roll-ups  Deli meat roll-ups with slice of cheese  Sugar Free Jello  Glucerna shake (16 grams)   Glucerna hunger smart shake (16 grams)  Ensure protein max shake  Fruit (1 serving/15 grams)  1/2 banana, mango, or grapefruit   1/3 melon (small cantaloupe)  1 slice or 1 cup of honeydew melon  1 slice or 1 and 1/4 cups of watermelon   1 small apple, peach, orange or pear  2 small tangerines  1 cup of raspberries  3/4 cup of blackberries, blueberries or pineapples  1/2 cup of fruit juice, pears, applesauce, or mangos  17 small grapes  12 cherries    Be careful with the glucerna products as they differ in the total carbs depending on the product (some are intended as meal replacements not snacks).  Make sure you look at the total carbs on the label as products can differ.     Physical Activity:  - Aim for 30 minutes of consistent, moderately intensive, physical activity a day for 5 days or an average of 150 minutes per week.   -  Start slow, increase as tolerated. For example: Walk every day, working up to 30 minutes of brisk walking, 5 days a week--or split the 30 minutes into two 15-minute or three 10-minute walks.    - If you sit for a long time, get up and move/stretch every 90 minutes.    Other recommendations:  - Schedule an annual eye exam.  - Check your feet daily for any signs of open wounds, cuts, or sores.    - Given your risk factors, the following vaccines are recommended: annual flu shot, age-based pneumococcal vaccines (Pneumovax, Prevnar 13).    In addition to taking your medications as directed, improving your blood sugar involves modifying your nutrition and maximizing the amount of physical activity.

## 2022-07-08 NOTE — Telephone Encounter (Signed)
Prescription for pregabalin 150 mg twice daily was sent to mail order on 06/26/2022.

## 2022-07-29 ENCOUNTER — Ambulatory Visit: Payer: BLUE CROSS/BLUE SHIELD | Primary: Internal Medicine

## 2022-07-29 NOTE — Telephone Encounter (Signed)
Pharmacy Progress Note - Telephone Call    [name] 62 y.o. was contacted via an outbound telephone call today to reschedule their missed appointment for PharmD diabetes management and education per referral from Blanch Media, MD.    Please call pt to reschedule.  Please close encounter after scheduled.  If unable to reach pt after 3 documented attempts, please close encounter.    Thank you,    Johnston Ebbs, PharmD, BCACP, BC-ADM

## 2022-07-29 NOTE — Progress Notes (Unsigned)
Pharmacy Progress Note - Diabetes Management       Assessment / Plan:   Diabetes Management:  Per ADA guidelines, [name] A1c {IS/IS NOT:19932} at goal of < 7%.  ***    Recommendations to [name] Media, MD:    - ***     Nutrition/Lifestyle Modifications:  - Educated pt on the importance of moderating carbohydrate intake. Reviewed sources of carbohydrates and method to help determine appropriate portion sizes (e.g., Diabetes Plate Method).  - Advised patient to avoid sugar-sweetened beverages and replace with water or diet/zero sugar option.  - Recommend ~30 minutes consistent, moderately intensive, exercise/day or ~150 minutes/week. Start small, stay consistent, and increase length and types of exercise, as tolerated.       Patient will return to clinic in *** week(s) for follow up.        S/O: [name], a 62 y.o. 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  Pt was seen today for diabetes management.  Patient's last A1c was:   Hemoglobin A1C   Date Value Ref Range Status   06/25/2022 9.8 (H) 4.8 - 5.6 % Final     Comment:                 Prediabetes: 5.7 - 6.4           Diabetes: >6.4           Glycemic control for adults with diabetes: <7.0         Interim update: Pt was last seen by me on 07/08/2022.  Per my prior note: [name] A1c is not at goal of < 7%.  Pt has a severe lack of basal control.  Will increase her Tresiba to 54 units qhs.  Her prandial control is highly variable d/t the erratic Humalog dosing with and without eating.  She is not following her established sliding scale at this time.  She does not have a set amount of Humalog to be given with each meal  leading to post-prandial elevations not controlled by the sliding scale.  She was previously relying on the Trulicity to cover the initial elevation from her meals.  Will now be utilizing Mounjaro 5mg  weekly to attempt to control her post-prandial elevations from her meals.  Will maintain her existing Humalog SSI to correct with up to 10 units > 400 mg/dL to account for the elevations not controlled by [facility].  The plan will be to titrate the dose of Mounjaro which should further improve its efficacy for prandial control.  Will reassess with CGM data in 3 weeks.    Today:   ***    Current anti-hyperglycemic regimen includes:    Key Antihyperglycemic Medications               Tirzepatide (MOUNJARO) 5 MG/0.5ML SOPN SC injection Inject 0.5 mLs into the skin once a week    Insulin Degludec (TRESIBA FLEXTOUCH) 200 UNIT/ML SOPN Inject 54 Units into the skin every evening    insulin lispro, 1 Unit Dial, (HUMALOG KWIKPEN) 100 UNIT/ML SOPN CHECK BLOOD GLUCOSE PRIOR TO MEALS AND AT BEDTIME. TAKE ACCORDING TO SLIDING SCALE FOR BS 180-200 USE 2 UNITS, 201-22O USE 4 UNITS, 221-240 USE 5 UNITS, 241-280 USE 6 UNITS,  281-320 USE 7 UNITS, 321-400 USE 8 UNITS, GREATER THAN 400 CALL DOCTOR          Complete current medication regimen includes:  Current Outpatient Medications   Medication Sig    Tirzepatide (MOUNJARO) 5 MG/0.5ML SOPN SC injection Inject 0.5 mLs into the skin once a week    Insulin Degludec (TRESIBA FLEXTOUCH) 200 UNIT/ML SOPN Inject 54 Units into the skin every evening    lisinopril (PRINIVIL;ZESTRIL) 10 MG tablet Take 1 tablet by mouth daily    magnesium oxide (MAG-OX) 400 MG tablet Take 1 tablet by mouth 2 times daily    meclizine (ANTIVERT) 25 MG tablet TAKE 1 TABLET BY MOUTH THREE (3) TIMES DAILY AS NEEDED FOR DIZZINESS    montelukast (SINGULAIR) 10 MG tablet TAKE 1 TABLET BY MOUTH DAILY. INDICATIONS: CONTROLLER MEDICATION FOR ASTHMA    potassium chloride (KLOR-CON M) 20 MEQ extended release tablet Take 2 tablets  by mouth daily    acyclovir (ZOVIRAX) 400 MG tablet Take 2 tablets by mouth 2 times daily    cyclobenzaprine (FLEXERIL) 10 MG tablet TAKE 1 TABLET BY MOUTH THREE TIMES A DAY AS NEEDED FOR MUSCLE SPASMS    atorvastatin (LIPITOR) 20 MG tablet Take 1 tablet by mouth daily    beclomethasone (QVAR REDIHALER) 80 MCG/ACT AERB inhaler Inhale 2 puffs into the lungs in the morning and 2 puffs in the evening.    pregabalin (LYRICA) 150 MG capsule Take 1 capsule by mouth 2 times daily for 180 days. Max Daily Amount: 300 mg    insulin lispro, 1 Unit Dial, (HUMALOG KWIKPEN) 100 UNIT/ML SOPN CHECK BLOOD GLUCOSE PRIOR TO MEALS AND AT BEDTIME. TAKE ACCORDING TO SLIDING SCALE FOR BS 180-200 USE 2 UNITS, 201-22O USE 4 UNITS, 221-240 USE 5 UNITS, 241-280 USE 6 UNITS, 281-320 USE 7 UNITS, 321-400 USE 8 UNITS, GREATER THAN 400 CALL DOCTOR    omeprazole (PRILOSEC OTC) 20 MG tablet Take 1 tablet by mouth daily    DULoxetine (CYMBALTA) 30 MG extended release capsule Take 1 capsule by mouth 2 times daily    Insulin Pen Needle 32G X 4 MM MISC 1 each by Does not apply route daily    BIOTIN PO Take 1 tablet by mouth daily    ZINC PO Take by mouth    albuterol sulfate HFA (PROVENTIL;VENTOLIN;PROAIR) 108 (90 Base) MCG/ACT inhaler Inhale 1 puff into the lungs every 4 hours as needed    albuterol (PROVENTIL) (2.5 MG/3ML) 0.083% nebulizer solution Inhale 3 mLs into the lungs every 4 hours as needed    vitamin D3 (CHOLECALCIFEROL) 125 MCG (5000 UT) TABS tablet Take 1 tablet by mouth daily    diphenhydrAMINE (BENADRYL) 25 MG capsule Take 1 capsule by mouth every 6 hours as needed    Specialty Vitamins Products (MAGNESIUM, AMINO ACID CHELATE,) 133 MG tablet Take 1 tablet by mouth 2 times daily    triamcinolone (KENALOG) 0.1 % cream APPLY TO AFFECTED AREA TWICE A DAY AS NEEDED FOR RASH    zolpidem (AMBIEN) 5 MG tablet Take 1 tablet by mouth.     No current facility-administered medications for this visit.     Allergies:  Allergies   Allergen Reactions     Penicillins Hives and Itching       Blood Glucose Monitoring (BGM) or CGM:  - Had access to home glucometer/blood glucose log/CGM reader today:  {YES/NO:19726}  - Conducts/scans: ***   - Fasting BG today: ***  - Post-prandial:   -FBG: ***   -  ac lunch: ***    -ac dinner: ***   -hs: ***    ROS:  Today, Pt endorses:  - {Symptoms of Hyperglycemia:17903:::1}  - {Symptoms of Hypoglycemia:17902:::1}    Lifestyle modification(s):  -     Medication Adherence/Access:  - Endorses adherence to current regimen?: {YES/NO:19726}    Vitals/Labs:  No results found for: "HBA1C"  Hemoglobin A1C   Date Value Ref Range Status   06/25/2022 9.8 (H) 4.8 - 5.6 % Final     Comment:                 Prediabetes: 5.7 - 6.4           Diabetes: >6.4           Glycemic control for adults with diabetes: <7.0     05/08/2022 9.1 (H) 4.2 - 5.6 % Final     Comment:     (NOTE)  HbA1C Interpretive Ranges  <5.7              Normal  5.7 - 6.4         Consider Prediabetes  >6.5              Consider Diabetes     01/21/2022 11.4 (H) 4.2 - 5.6 % Final     Comment:     (NOTE)  HbA1C Interpretive Ranges  <5.7              Normal  5.7 - 6.4         Consider Prediabetes  >6.5              Consider Diabetes         Screenings/Prevention Parameters:  -Diabetic Eye and Foot Exams:      Diabetes Management   Topic Date Due    Diabetic retinal exam  10/20/2021     -Microalbumin / Creatinine ratio:       Lab Results   Component Value Date/Time    MICROALBUR 1.51 01/23/2021 08:00 AM    LABCREA 67.4 06/25/2022 12:00 AM        Lab Results   Component Value Date    MALBCR <4 06/25/2022    MALBCR 12 01/23/2021    MALBCR 11 05/04/2020      No results found for: "MALBCREARAT"  -Immunizations:      Immunization History   Administered Date(s) Administered    COVID-19, MODERNA BLUE border, Primary or Immunocompromised, (age 12y+), IM, 100 mcg/0.6mL 06/16/2019, 07/14/2019    COVID-19, MODERNA Bivalent, (age 12y+), IM, 50 mcg/0.5 mL 09/13/2021    COVID-19, MODERNA, (2023-24  formula), (age 12y+), IM, 9mcg/0.5mL 02/18/2022    COVID-19, PFIZER Bivalent, DO NOT Dilute, (age 12y+), IM, 30 mcg/0.3 mL 01/29/2021    COVID-19, PFIZER PURPLE top, DILUTE for use, (age 50 y+), 32mcg/0.3mL 03/12/2020, 10/22/2020    Influenza Trivalent 02/27/2018, 02/23/2019, 03/01/2020    Influenza Virus Vaccine 02/18/2022    Influenza, FLUARIX, FLULAVAL, FLUZONE (age 36 mo+) AND AFLURIA, (age 5 y+), PF, 0.52mL 01/23/2021    Pneumococcal, PCV20, PREVNAR 20, (age 6w+), IM, 0.26mL 01/23/2021    Pneumococcal, PPSV23, PNEUMOVAX 23, (age 2y+), SC/IM, 0.30mL 03/18/2019    TDaP, ADACEL (age 48y-64y), BOOSTRIX (age 10y+), IM, 0.66mL 03/18/2019    Zoster Recombinant (Shingrix) 09/13/2021       Additional Laboratory Parameters of Interest:   Estimation of renal function:  Lab Results   Component Value Date/Time    LABGLOM 66 06/25/2022 12:00 AM    LABGLOM >  60 05/08/2022 01:28 PM    LABGLOM >60 01/21/2022 03:21 PM    GFRAA >60 01/12/2021 11:49 AM    GFRAA >60 11/22/2020 11:12 AM    GFRAA >60 08/10/2020 03:38 PM     Wt Readings from Last 3 Encounters:   06/26/22 94.3 kg (208 lb)   02/20/22 93.4 kg (206 lb)   01/23/21 95.7 kg (211 lb)     Ht Readings from Last 1 Encounters:   06/26/22 1.727 m (5\' 8" )     Calculated estimated creatinine clearance: CrCl cannot be calculated (Unknown ideal weight.).    Vital Signs Today:    There were no vitals taken for this visit.    There are no discontinued medications.    No orders of the defined types were placed in this encounter.      Future Appointments   Date Time Provider [place]   07/29/2022  1:30 PM Jackquline Berlin [facility] BS AMB   09/24/2022 11:15 AM [facility] LAB VISIT [facility] BS AMB   10/09/2022  2:30 PM Sarris, Royston Cowper, MD [facility] BS AMB       Patient verbalized understanding of the information presented and all of the patient's questions were answered.  AVS was handed to the patient. Patient advised to call the office with any additional questions or concerns.    Notifications of  recommendations will be sent to [name] Media, MD for review.      Thank you for the consult,  Johnston Ebbs, PharmD, BCACP, BC-ADM        {[facility] [employer]

## 2022-07-31 NOTE — Telephone Encounter (Signed)
lmtrc

## 2022-08-01 ENCOUNTER — Encounter

## 2022-08-01 NOTE — Telephone Encounter (Signed)
PCP: Blanch Media, MD    LAST REFILL PER CHART:  Medication:Tirzepatide Darcel Bayley) 5 MG/0.5ML SOPN SC injection   Ordered On:07/08/2022  Instructions: Inject 0.5 mLs into the skin once a week  Dispense:35mL  Refills:0      Future Appointments   Date Time Provider [place]   09/24/2022 11:15 AM [facility] LAB VISIT [place] BS AMB   10/09/2022  2:30 PM Sarris, Royston Cowper, MD [facility] BS AMB

## 2022-08-02 MED ORDER — MOUNJARO 5 MG/0.5ML SC SOPN
50.5 MG/0.ML | SUBCUTANEOUS | 1 refills | Status: AC
Start: 2022-08-02 — End: 2023-03-09

## 2022-08-02 NOTE — Telephone Encounter (Signed)
Rescheduled to Pharm D first appointment April 12th.

## 2022-08-02 NOTE — Telephone Encounter (Signed)
Please contact patient to reschedule missed appointment with PharmD D[place].

## 2022-08-06 ENCOUNTER — Encounter

## 2022-08-07 MED ORDER — TRESIBA FLEXTOUCH 200 UNIT/ML SC SOPN
200 UNIT/ML | Freq: Every evening | SUBCUTANEOUS | 1 refills | Status: AC
Start: 2022-08-07 — End: 2022-08-28

## 2022-08-07 MED ORDER — OMEPRAZOLE MAGNESIUM 20 MG PO TBEC
20 | ORAL_TABLET | Freq: Every day | ORAL | 3 refills | Status: AC
Start: 2022-08-07 — End: ?

## 2022-08-07 MED ORDER — FREESTYLE LIBRE 2 SENSOR MISC
3 refills | Status: AC
Start: 2022-08-07 — End: 2022-08-28

## 2022-08-07 NOTE — Telephone Encounter (Signed)
Prescriptions for Tresiba, omeprazole, and FreeStyle Libre 2 sensors sent to [facility]

## 2022-08-07 NOTE — Telephone Encounter (Signed)
From: Blenda Bridegroom  To: Jackquline Berlin  Sent: 08/06/2022 3:58 PM EDT  Subject: Refill and renewal of glucose and insulin prescriptions    Hello!    I really really need my Freestyle Libre 2 prescription and my [place]. Also, my Omeprazole is still awaiting your approval. All three should be sent to [facility]. Thank you!    Marlyn Corporal

## 2022-08-21 ENCOUNTER — Encounter: Payer: BLUE CROSS/BLUE SHIELD | Primary: Internal Medicine

## 2022-08-23 ENCOUNTER — Encounter: Payer: BLUE CROSS/BLUE SHIELD | Primary: Internal Medicine

## 2022-08-23 NOTE — Progress Notes (Deleted)
Pharmacy Progress Note - Diabetes Management       Assessment / Plan:   Diabetes Management:  Per ADA guidelines, [name] A1c {IS/IS NOT:19932} at goal of < 7%.  ***    Recommendations to [place] Pink, MD:    - ***     Nutrition/Lifestyle Modifications:  - Educated pt on the importance of moderating carbohydrate intake. Reviewed sources of carbohydrates and method to help determine appropriate portion sizes (e.g., Diabetes Plate Method).  - Advised patient to avoid sugar-sweetened beverages and replace with water or diet/zero sugar option.  - Recommend ~30 minutes consistent, moderately intensive, exercise/day or ~150 minutes/week. Start small, stay consistent, and increase length and types of exercise, as tolerated.       Patient will return to clinic in *** week(s) for follow up.        S/O: [name], a 62 y.o. 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  Pt was seen today for diabetes management.  Patient's last A1c was:   Hemoglobin A1C   Date Value Ref Range Status   06/25/2022 9.8 (H) 4.8 - 5.6 % Final     Comment:                 Prediabetes: 5.7 - 6.4           Diabetes: >6.4           Glycemic control for adults with diabetes: <7.0         Interim update: Pt was last seen by me on 07/08/2022.  Per my prior note: [name] A1c is not at goal of < 7%.  Pt has a severe lack of basal control.  Will increase her Tresiba to 54 units qhs.  Her prandial control is highly variable d/t the erratic Humalog dosing with and without eating.  She is not following her established sliding scale at this time.  She does not have a set amount of Humalog to be given with each meal  leading to post-prandial elevations not controlled by the sliding scale.  She was previously relying on the Trulicity to cover the initial elevation from her meals.  Will now be utilizing Mounjaro 5mg  weekly to attempt to control her post-prandial elevations from her meals.  Will maintain her existing Humalog SSI to correct with up to 10 units > 400 mg/dL to account for the elevations not controlled by [facility].  The plan will be to titrate the dose of Mounjaro which should further improve its efficacy for prandial control.  Will reassess with CGM data in 3 weeks.    Today:   ***    Current anti-hyperglycemic regimen includes:    Key Antihyperglycemic Medications               Insulin Degludec (TRESIBA FLEXTOUCH) 200 UNIT/ML SOPN Inject 54 Units into the skin every evening    Tirzepatide (MOUNJARO) 5 MG/0.5ML SOPN SC injection INJECT 0.5 ML SUBCUTANEOUSLY ONE TIME PER WEEK    insulin lispro, 1 Unit Dial, (HUMALOG KWIKPEN) 100 UNIT/ML SOPN CHECK BLOOD GLUCOSE PRIOR TO MEALS AND AT BEDTIME. TAKE ACCORDING TO SLIDING SCALE FOR BS 180-200 USE 2 UNITS, 201-22O USE 4 UNITS, 221-240 USE 5 UNITS, 241-280 USE 6 UNITS, 281-320  USE 7 UNITS, 321-400 USE 8 UNITS, GREATER THAN 400 CALL DOCTOR          Complete current medication regimen includes:  Current Outpatient Medications   Medication Sig    Insulin Degludec (TRESIBA FLEXTOUCH) 200 UNIT/ML SOPN Inject 54 Units into the skin every evening    omeprazole (PRILOSEC OTC) 20 MG tablet Take 1 tablet by mouth daily    Continuous Blood Gluc Sensor (FREESTYLE LIBRE 2 SENSOR) MISC Monitor blood sugars 4 times per day and as needed.    Tirzepatide (MOUNJARO) 5 MG/0.5ML SOPN SC injection INJECT 0.5 ML SUBCUTANEOUSLY ONE TIME PER WEEK    lisinopril (PRINIVIL;ZESTRIL) 10 MG tablet Take 1 tablet by mouth daily    magnesium oxide (MAG-OX) 400 MG tablet Take 1 tablet by mouth 2 times daily    meclizine (ANTIVERT) 25 MG tablet TAKE 1 TABLET BY MOUTH THREE (3) TIMES DAILY AS NEEDED FOR  DIZZINESS    montelukast (SINGULAIR) 10 MG tablet TAKE 1 TABLET BY MOUTH DAILY. INDICATIONS: CONTROLLER MEDICATION FOR ASTHMA    potassium chloride (KLOR-CON M) 20 MEQ extended release tablet Take 2 tablets by mouth daily    acyclovir (ZOVIRAX) 400 MG tablet Take 2 tablets by mouth 2 times daily    cyclobenzaprine (FLEXERIL) 10 MG tablet TAKE 1 TABLET BY MOUTH THREE TIMES A DAY AS NEEDED FOR MUSCLE SPASMS    atorvastatin (LIPITOR) 20 MG tablet Take 1 tablet by mouth daily    beclomethasone (QVAR REDIHALER) 80 MCG/ACT AERB inhaler Inhale 2 puffs into the lungs in the morning and 2 puffs in the evening.    pregabalin (LYRICA) 150 MG capsule Take 1 capsule by mouth 2 times daily for 180 days. Max Daily Amount: 300 mg    insulin lispro, 1 Unit Dial, (HUMALOG KWIKPEN) 100 UNIT/ML SOPN CHECK BLOOD GLUCOSE PRIOR TO MEALS AND AT BEDTIME. TAKE ACCORDING TO SLIDING SCALE FOR BS 180-200 USE 2 UNITS, 201-22O USE 4 UNITS, 221-240 USE 5 UNITS, 241-280 USE 6 UNITS, 281-320 USE 7 UNITS, 321-400 USE 8 UNITS, GREATER THAN 400 CALL DOCTOR    DULoxetine (CYMBALTA) 30 MG extended release capsule Take 1 capsule by mouth 2 times daily    Insulin Pen Needle 32G X 4 MM MISC 1 each by Does not apply route daily    BIOTIN PO Take 1 tablet by mouth daily    ZINC PO Take by mouth    albuterol sulfate HFA (PROVENTIL;VENTOLIN;PROAIR) 108 (90 Base) MCG/ACT inhaler Inhale 1 puff into the lungs every 4 hours as needed    albuterol (PROVENTIL) (2.5 MG/3ML) 0.083% nebulizer solution Inhale 3 mLs into the lungs every 4 hours as needed    vitamin D3 (CHOLECALCIFEROL) 125 MCG (5000 UT) TABS tablet Take 1 tablet by mouth daily    diphenhydrAMINE (BENADRYL) 25 MG capsule Take 1 capsule by mouth every 6 hours as needed    Specialty Vitamins Products (MAGNESIUM, AMINO ACID CHELATE,) 133 MG tablet Take 1 tablet by mouth 2 times daily    triamcinolone (KENALOG) 0.1 % cream APPLY TO AFFECTED AREA TWICE A DAY AS NEEDED FOR RASH    zolpidem (AMBIEN) 5 MG tablet  Take 1 tablet by mouth.     No current facility-administered medications for this visit.     Allergies:  Allergies   Allergen Reactions    Penicillins Hives and Itching       Blood Glucose Monitoring (BGM) or CGM:  - Had access to home glucometer/blood glucose log/CGM reader today:  {YES/NO:19726}  -  Conducts/scans: ***   - Fasting BG today: ***  - Post-prandial:   -FBG: ***   -ac lunch: ***    -ac dinner: ***   -hs: ***    ROS:  Today, Pt endorses:  - {Symptoms of Hyperglycemia:17903:::1}  - {Symptoms of Hypoglycemia:17902:::1}    Lifestyle modification(s):  -     Medication Adherence/Access:  - Endorses adherence to current regimen?: {YES/NO:19726}    Vitals/Labs:  No results found for: "HBA1C"  Hemoglobin A1C   Date Value Ref Range Status   06/25/2022 9.8 (H) 4.8 - 5.6 % Final     Comment:                 Prediabetes: 5.7 - 6.4           Diabetes: >6.4           Glycemic control for adults with diabetes: <7.0     05/08/2022 9.1 (H) 4.2 - 5.6 % Final     Comment:     (NOTE)  HbA1C Interpretive Ranges  <5.7              Normal  5.7 - 6.4         Consider Prediabetes  >6.5              Consider Diabetes     01/21/2022 11.4 (H) 4.2 - 5.6 % Final     Comment:     (NOTE)  HbA1C Interpretive Ranges  <5.7              Normal  5.7 - 6.4         Consider Prediabetes  >6.5              Consider Diabetes         Screenings/Prevention Parameters:  -Diabetic Eye and Foot Exams:      Diabetes Management   Topic Date Due    Diabetic retinal exam  10/20/2021     -Microalbumin / Creatinine ratio:       Lab Results   Component Value Date/Time    MICROALBUR 1.51 01/23/2021 08:00 AM    LABCREA 67.4 06/25/2022 12:00 AM        Lab Results   Component Value Date    MALBCR <4 06/25/2022    MALBCR 12 01/23/2021    MALBCR 11 05/04/2020      No results found for: "MALBCREARAT"  -Immunizations:      Immunization History   Administered Date(s) Administered    COVID-19, MODERNA BLUE border, Primary or Immunocompromised, (age 12y+), IM, 100  mcg/0.58mL 06/16/2019, 07/14/2019    COVID-19, MODERNA Bivalent, (age 12y+), IM, 50 mcg/0.5 mL 09/13/2021    COVID-19, MODERNA, (2023-24 formula), (age 12y+), IM, 59mcg/0.5mL 02/18/2022, 07/25/2022    COVID-19, PFIZER Bivalent, DO NOT Dilute, (age 12y+), IM, 30 mcg/0.3 mL 01/29/2021    COVID-19, PFIZER PURPLE top, DILUTE for use, (age 22 y+), 59mcg/0.3mL 03/12/2020, 10/22/2020    Influenza Trivalent 02/27/2018, 02/23/2019, 03/01/2020    Influenza Virus Vaccine 02/18/2022    Influenza, FLUARIX, FLULAVAL, FLUZONE (age 84 mo+) AND AFLURIA, (age 48 y+), PF, 0.35mL 01/23/2021    Pneumococcal, PCV20, PREVNAR 20, (age 84w+), IM, 0.43mL 01/23/2021    Pneumococcal, PPSV23, PNEUMOVAX 23, (age 2y+), SC/IM, 0.75mL 03/18/2019    TDaP, ADACEL (age 37y-64y), BOOSTRIX (age 10y+), IM, 0.21mL 03/18/2019    Zoster Recombinant (Shingrix) 09/13/2021       Additional Laboratory Parameters of Interest:   Estimation of renal function:  Lab Results   Component Value Date/Time    LABGLOM 66 06/25/2022 12:00 AM    LABGLOM >60 05/08/2022 01:28 PM    LABGLOM >60 01/21/2022 03:21 PM    GFRAA >60 01/12/2021 11:49 AM    GFRAA >60 11/22/2020 11:12 AM    GFRAA >60 08/10/2020 03:38 PM     Wt Readings from Last 3 Encounters:   06/26/22 94.3 kg (208 lb)   02/20/22 93.4 kg (206 lb)   01/23/21 95.7 kg (211 lb)     Ht Readings from Last 1 Encounters:   06/26/22 1.727 m (5\' 8" )     Calculated estimated creatinine clearance: CrCl cannot be calculated (Unknown ideal weight.).    Vital Signs Today:    There were no vitals taken for this visit.    There are no discontinued medications.    No orders of the defined types were placed in this encounter.      Future Appointments   Date Time Provider Department Center   08/23/2022  9:30 AM Jacqualine Mau, [facility] BS AMB   09/24/2022 11:15 AM [facility] LAB VISIT [facility] BS AMB   10/09/2022  2:30 PM Sarris, Vinetta Bergamo, MD [facility] BS AMB       Patient verbalized understanding of the information presented and all of the patient's  questions were answered.  AVS was handed to the patient. Patient advised to call the office with any additional questions or concerns.    Notifications of recommendations will be sent to [place] Pink, MD for review.      Thank you for the consult,  Estill Cotta, PharmD, BCACP, BC-ADM        {[facility]ail Pharmacy [employer]

## 2022-08-23 NOTE — Telephone Encounter (Signed)
Pharmacy Progress Note - Telephone Call    [name] Wu 62 y.o. was contacted via an outbound telephone call today to reschedule their missed appointment for PharmD diabetes management and education per referral from [place] Pink, MD.    Please call pt to reschedule.  Please close encounter after scheduled.  If unable to reach pt after 3 documented attempts, please close encounter.    Thank you,    Estill Cotta, PharmD, BCACP, BC-ADM

## 2022-08-26 NOTE — Telephone Encounter (Signed)
Patient have been scheduled.

## 2022-08-28 ENCOUNTER — Encounter: Admit: 2022-08-28 | Discharge: 2022-08-28 | Primary: Internal Medicine

## 2022-08-28 MED ORDER — FREESTYLE LIBRE 3 SENSOR MISC
3 refills | Status: AC
Start: 2022-08-28 — End: 2023-06-13

## 2022-08-28 MED ORDER — TRESIBA FLEXTOUCH 200 UNIT/ML SC SOPN
200 UNIT/ML | Freq: Every evening | SUBCUTANEOUS | 1 refills | Status: AC
Start: 2022-08-28 — End: 2022-09-26

## 2022-08-28 NOTE — Patient Instructions (Addendum)
-   Increase Tresiba to 70 units every night   * Increase by 2 units every 2 days until your morning FASTING blood glucose is consistently < 140 mg/dL    - Switch to the [employer]

## 2022-08-28 NOTE — Progress Notes (Signed)
Pharmacy Progress Note - Diabetes Management       Assessment / Plan:   Diabetes Management:  Per ADA guidelines, [name] A1c is not at goal of < 7%.  [name] basal control is very poor at this time.  She is also having some large post-prandial elevations during her large, single meal of the day.  Encouraged to eat smaller, more frequent meals to take advantage of Mounjaro plus the SSI from Humalog.  Will increase her Tresiba to 70 units qhs and have her titrate by 2 units every 2 days until her FBG < 140 mg/dL.  Will maintain her current Mounjaro dose for now and will focus on basal control.  Will switch pt over to the [place] d/t her difficulty remembering to scan her Tru[place] an adequate amount of times to provide 24hr data.  Will reassess with CGM data in 3 weeks.     Nutrition/Lifestyle Modifications:  - Educated pt on the importance of moderating carbohydrate intake. Reviewed sources of carbohydrates and method to help determine appropriate portion sizes (e.g., Diabetes Plate Method).  - Advised patient to avoid sugar-sweetened beverages and replace with water or diet/zero sugar option.  - Recommend ~30 minutes consistent, moderately intensive, exercise/day or ~150 minutes/week. Start small, stay consistent, and increase length and types of exercise, as tolerated.       Patient will return to clinic in 3 week(s) for follow up.        S/O: [name], a 62 y.o. 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  Pt was seen today for diabetes management.  Patient's last A1c was:   Hemoglobin A1C   Date Value Ref Range Status   06/25/2022 9.8 (H) 4.8 - 5.6 % Final     Comment:                  Prediabetes: 5.7 - 6.4           Diabetes: >6.4           Glycemic control for adults with diabetes: <7.0         Interim update: Pt was last seen by me on 07/08/2022.  Per my prior note: [name] A1c is not at goal of < 7%.  Pt has a severe lack of basal control.  Will increase her Tresiba to 54 units qhs.  Her prandial control is highly variable d/t the erratic Humalog dosing with and without eating.  She is not following her established sliding scale at this time.  She does not have a set amount of Humalog to be given with each meal leading to post-prandial elevations not controlled by the sliding scale.  She was previously relying on the Trulicity to cover the initial elevation from her meals.  Will now be utilizing Mounjaro 5mg  weekly to attempt to control her post-prandial elevations from her meals.  Will maintain her existing Humalog SSI to correct with up to 10 units > 400 mg/dL to account for the elevations not controlled by [facility].  The plan will be to titrate the dose of Mounjaro which should further improve its efficacy for prandial control.  Will reassess with CGM data in 3 weeks.    Pt was lost to follow up.  Today:   [name] last Mounjaro injection was last Friday.  She only missed one injection since the last visit d/t a delay in receiving her refill.  She has restarted working and her schedule is poor.  She is only eating once daily most days.  Encouraged to eat smaller, more frequent meals to prevent the large post-prandial elevations and pt verbalized understanding.  Titration schedule gone over with pt for her [place].  [name] CGM data gone over and gaps in data shown to pt.  Advised that she needed to scan at least every 8 hours for full data.  Rather than scanning more often, she would be amenable to a switch over to the [place].  Libre3 sample given to pt.    Freestyle Libre Education:  Pt was educated on changing the setting ranges and goals BG, meaning of arrows by blood sugar readings and  how to address if indicated, when to check BG with fingerstick, scan for sugar / review of data / starting new sensor, 60 minute delay starting scanning after activate sensor, placement of sensor on the back of the arm, alternating arms every 14 days when new sensor placed, using both alcohol swabs before placing sensor, opening sensor and applicator, how to place on arm, starting new sensor, charging, sharing data, fibers in arm / needle retracts, can shower with sensor on, can cover / use barrier wipe if needed for adhesion, alert when time to change sensor, removing sensor, use of LibreLink app if would like to use phone, linking up data with clinic using home computer if indicated. Alarms for low and high sugars and signal loss and how to change. Cannot re apply sensor that has fallen off. Ability to call company if sensor is faulty.     Current anti-hyperglycemic regimen includes:    Key Antihyperglycemic Medications               Insulin Degludec (TRESIBA FLEXTOUCH) 200 UNIT/ML SOPN Inject 54 Units into the skin every evening    Tirzepatide (MOUNJARO) 5 MG/0.5ML SOPN SC injection INJECT 0.5 ML SUBCUTANEOUSLY ONE TIME PER WEEK    insulin lispro, 1 Unit Dial, (HUMALOG KWIKPEN) 100 UNIT/ML SOPN CHECK BLOOD GLUCOSE PRIOR TO MEALS AND AT BEDTIME. TAKE ACCORDING TO SLIDING SCALE FOR BS 180-200 USE 2 UNITS, 201-22O USE 4 UNITS, 221-240 USE 5 UNITS, 241-280 USE 6 UNITS, 281-320 USE 7 UNITS, 321-400 USE 8 UNITS, GREATER THAN 400 CALL DOCTOR          Complete current medication regimen includes:  Current Outpatient Medications   Medication Sig    Insulin Degludec (TRESIBA FLEXTOUCH) 200 UNIT/ML SOPN Inject 54 Units into the skin every evening    omeprazole (PRILOSEC OTC) 20 MG tablet Take 1 tablet by mouth daily    Continuous Blood Gluc Sensor (FREESTYLE LIBRE 2 SENSOR) MISC Monitor blood sugars 4 times per day and as needed.    Tirzepatide (MOUNJARO) 5 MG/0.5ML SOPN SC injection INJECT 0.5 ML SUBCUTANEOUSLY ONE TIME  PER WEEK    lisinopril (PRINIVIL;ZESTRIL) 10 MG tablet Take 1 tablet by mouth daily    magnesium oxide (MAG-OX) 400 MG tablet Take 1 tablet by mouth 2 times daily    meclizine (ANTIVERT) 25 MG tablet TAKE 1 TABLET BY MOUTH THREE (3) TIMES DAILY AS NEEDED FOR DIZZINESS    montelukast (SINGULAIR) 10 MG tablet TAKE 1 TABLET BY MOUTH DAILY. INDICATIONS: CONTROLLER MEDICATION FOR ASTHMA    potassium chloride (KLOR-CON M) 20 MEQ extended release tablet Take 2  tablets by mouth daily    acyclovir (ZOVIRAX) 400 MG tablet Take 2 tablets by mouth 2 times daily    cyclobenzaprine (FLEXERIL) 10 MG tablet TAKE 1 TABLET BY MOUTH THREE TIMES A DAY AS NEEDED FOR MUSCLE SPASMS    atorvastatin (LIPITOR) 20 MG tablet Take 1 tablet by mouth daily    beclomethasone (QVAR REDIHALER) 80 MCG/ACT AERB inhaler Inhale 2 puffs into the lungs in the morning and 2 puffs in the evening.    pregabalin (LYRICA) 150 MG capsule Take 1 capsule by mouth 2 times daily for 180 days. Max Daily Amount: 300 mg    insulin lispro, 1 Unit Dial, (HUMALOG KWIKPEN) 100 UNIT/ML SOPN CHECK BLOOD GLUCOSE PRIOR TO MEALS AND AT BEDTIME. TAKE ACCORDING TO SLIDING SCALE FOR BS 180-200 USE 2 UNITS, 201-22O USE 4 UNITS, 221-240 USE 5 UNITS, 241-280 USE 6 UNITS, 281-320 USE 7 UNITS, 321-400 USE 8 UNITS, GREATER THAN 400 CALL DOCTOR    DULoxetine (CYMBALTA) 30 MG extended release capsule Take 1 capsule by mouth 2 times daily    Insulin Pen Needle 32G X 4 MM MISC 1 each by Does not apply route daily    BIOTIN PO Take 1 tablet by mouth daily    ZINC PO Take by mouth    albuterol sulfate HFA (PROVENTIL;VENTOLIN;PROAIR) 108 (90 Base) MCG/ACT inhaler Inhale 1 puff into the lungs every 4 hours as needed    albuterol (PROVENTIL) (2.5 MG/3ML) 0.083% nebulizer solution Inhale 3 mLs into the lungs every 4 hours as needed    vitamin D3 (CHOLECALCIFEROL) 125 MCG (5000 UT) TABS tablet Take 1 tablet by mouth daily    diphenhydrAMINE (BENADRYL) 25 MG capsule Take 1 capsule by mouth every 6  hours as needed    Specialty Vitamins Products (MAGNESIUM, AMINO ACID CHELATE,) 133 MG tablet Take 1 tablet by mouth 2 times daily    triamcinolone (KENALOG) 0.1 % cream APPLY TO AFFECTED AREA TWICE A DAY AS NEEDED FOR RASH    zolpidem (AMBIEN) 5 MG tablet Take 1 tablet by mouth.     No current facility-administered medications for this visit.     Allergies:  Allergies   Allergen Reactions    Penicillins Hives and Itching       Blood Glucose Monitoring (BGM) or CGM:      ROS:  Today, Pt endorses:  - Symptoms of Hyperglycemia: none  - Symptoms of Hypoglycemia: none    Lifestyle modification(s):  - none    Medication Adherence/Access:  - Endorses adherence to current regimen?: Yes    Vitals/Labs:  No results found for: "HBA1C"  Hemoglobin A1C   Date Value Ref Range Status   06/25/2022 9.8 (H) 4.8 - 5.6 % Final     Comment:                 Prediabetes: 5.7 - 6.4           Diabetes: >6.4           Glycemic control for adults with diabetes: <7.0     05/08/2022 9.1 (H) 4.2 - 5.6 % Final     Comment:     (NOTE)  HbA1C Interpretive Ranges  <5.7              Normal  5.7 - 6.4         Consider Prediabetes  >6.5              Consider Diabetes     01/21/2022 11.4 (  H) 4.2 - 5.6 % Final     Comment:     (NOTE)  HbA1C Interpretive Ranges  <5.7              Normal  5.7 - 6.4         Consider Prediabetes  >6.5              Consider Diabetes         Screenings/Prevention Parameters:  -Diabetic Eye and Foot Exams:      Diabetes Management   Topic Date Due    Diabetic retinal exam  10/20/2021    A1C test (Diabetic or Prediabetic)  09/23/2022     -Microalbumin / Creatinine ratio:       Lab Results   Component Value Date/Time    MICROALBUR 1.51 01/23/2021 08:00 AM    LABCREA 67.4 06/25/2022 12:00 AM        Lab Results   Component Value Date    MALBCR <4 06/25/2022    MALBCR 12 01/23/2021    MALBCR 11 05/04/2020      No results found for: "MALBCREARAT"  -Immunizations:      Immunization History   Administered Date(s) Administered     COVID-19, MODERNA BLUE border, Primary or Immunocompromised, (age 12y+), IM, 100 mcg/0.36mL 06/16/2019, 07/14/2019    COVID-19, MODERNA Bivalent, (age 12y+), IM, 50 mcg/0.5 mL 09/13/2021    COVID-19, MODERNA, (2023-24 formula), (age 12y+), IM, 11mcg/0.5mL 02/18/2022, 07/25/2022    COVID-19, PFIZER Bivalent, DO NOT Dilute, (age 12y+), IM, 30 mcg/0.3 mL 01/29/2021    COVID-19, PFIZER PURPLE top, DILUTE for use, (age 45 y+), 46mcg/0.3mL 03/12/2020, 10/22/2020    Influenza Trivalent 02/27/2018, 02/23/2019, 03/01/2020    Influenza Virus Vaccine 02/18/2022    Influenza, FLUARIX, FLULAVAL, FLUZONE (age 1 mo+) AND AFLURIA, (age 11 y+), PF, 0.79mL 01/23/2021    Pneumococcal, PCV20, PREVNAR 20, (age 1w+), IM, 0.1mL 01/23/2021    Pneumococcal, PPSV23, PNEUMOVAX 23, (age 2y+), SC/IM, 0.62mL 03/18/2019    TDaP, ADACEL (age 85y-64y), BOOSTRIX (age 10y+), IM, 0.28mL 03/18/2019    Zoster Recombinant (Shingrix) 09/13/2021       Additional Laboratory Parameters of Interest:   Estimation of renal function:  Lab Results   Component Value Date/Time    LABGLOM 66 06/25/2022 12:00 AM    LABGLOM >60 05/08/2022 01:28 PM    LABGLOM >60 01/21/2022 03:21 PM    GFRAA >60 01/12/2021 11:49 AM    GFRAA >60 11/22/2020 11:12 AM    GFRAA >60 08/10/2020 03:38 PM     Wt Readings from Last 3 Encounters:   06/26/22 94.3 kg (208 lb)   02/20/22 93.4 kg (206 lb)   01/23/21 95.7 kg (211 lb)     Ht Readings from Last 1 Encounters:   06/26/22 1.727 m (5\' 8" )     Calculated estimated creatinine clearance: estimated creatinine clearance is 72 mL/min (based on SCr of 0.97 mg/dL).    Vital Signs Today:    There were no vitals taken for this visit.    Medications Discontinued During This Encounter   Medication Reason    Continuous Blood Gluc Sensor (FREESTYLE LIBRE 2 SENSOR) MISC Formulary change    Insulin Degludec (TRESIBA FLEXTOUCH) 200 UNIT/ML SOPN DOSE ADJUSTMENT       Orders Placed This Encounter    Continuous Blood Gluc Sensor (FREESTYLE LIBRE 3 SENSOR) MISC      Sig: Use to monitor blood glucose continuously - change every 14 days     Dispense:  6 each     Refill:  3    Insulin Degludec (TRESIBA FLEXTOUCH) 200 UNIT/ML SOPN     Sig: Inject 70 Units into the skin every evening Adjust as directed.     Dispense:  18 mL     Refill:  1       Future Appointments   Date Time Provider Department Center   09/18/2022 10:00 AM Jacqualine Mau, [facility] BS AMB   09/24/2022 11:15 AM [facility] LAB VISIT [facility] AMB   10/09/2022  2:30 PM Sarris, Vinetta Bergamo, MD [facility] AMB       Patient verbalized understanding of the information presented and all of the patient's questions were answered.  AVS was handed to the patient. Patient advised to call the office with any additional questions or concerns.    Notifications of recommendations will be sent to  Pink, MD for review.      Thank you for the consult,  Estill Cotta, PharmD, BCACP, BC-ADM        For Pharmacy Admin Tracking Only    Program: Medical Group  CPA in place:  Yes  Recommendation Provided To: Patient/Caregiver: 7 via In person  Intervention Detail: Adherence Monitoring: 1, Device Training, Discontinued Rx: 1, reason: Cost/Formulary Change, Dose Adjustment: 1, reason: Therapy Optimization, New Rx: 1, reason: Cost/Formulary Change, Patient Access Assistance/Sample Provided, and Scheduled Appointment  Intervention Accepted By: Patient/Caregiver: 7  Gap Closed?: Yes   Time Spent (min): 30

## 2022-09-18 ENCOUNTER — Encounter: Payer: BLUE CROSS/BLUE SHIELD | Primary: Internal Medicine

## 2022-09-18 NOTE — Progress Notes (Unsigned)
Pharmacy Progress Note - Diabetes Management       Assessment / Plan:   Diabetes Management:  Per ADA guidelines, [name] A1c {IS/IS NOT:19932} at goal of < 7%.  ***    Recommendations to [name], MD:    - ***     Nutrition/Lifestyle Modifications:  - Educated pt on the importance of moderating carbohydrate intake. Reviewed sources of carbohydrates and method to help determine appropriate portion sizes (e.g., Diabetes Plate Method).  - Advised patient to avoid sugar-sweetened beverages and replace with water or diet/zero sugar option.  - Recommend ~30 minutes consistent, moderately intensive, exercise/day or ~150 minutes/week. Start small, stay consistent, and increase length and types of exercise, as tolerated.       Patient will return to clinic in *** week(s) for follow up.        S/O: [name], a 62 y.o. 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  Pt was seen today for diabetes management.  Patient's last A1c was:   Hemoglobin A1C   Date Value Ref Range Status   06/25/2022 9.8 (H) 4.8 - 5.6 % Final     Comment:                 Prediabetes: 5.7 - 6.4           Diabetes: >6.4           Glycemic control for adults with diabetes: <7.0         Interim update: Pt was last seen by me on 08/28/2022.  Per my prior note: [name] A1c is not at goal of < 7%.  [name] basal control is very poor at this time.  She is also having some large post-prandial elevations during her large, single meal of the day.  Encouraged to eat smaller, more frequent meals to take advantage of Mounjaro plus the SSI from Humalog.  Will increase her Tresiba to 70 units qhs and have her titrate by 2  units every 2 days until her FBG < 140 mg/dL.  Will maintain her current Mounjaro dose for now and will focus on basal control.  Will switch pt over to the [place] d/t her difficulty remembering to scan her [place] an adequate amount of times to provide 24hr data.  Will reassess with CGM data in 3 weeks.    Today:   ***    Current anti-hyperglycemic regimen includes:    Key Antihyperglycemic Medications               Insulin Degludec (TRESIBA FLEXTOUCH) 200 UNIT/ML SOPN Inject 70 Units into the skin every evening Adjust as directed.    Tirzepatide (MOUNJARO) 5 MG/0.5ML SOPN SC injection INJECT 0.5 ML SUBCUTANEOUSLY ONE TIME PER WEEK    insulin lispro, 1 Unit Dial, (HUMALOG KWIKPEN) 100 UNIT/ML SOPN CHECK BLOOD GLUCOSE PRIOR TO MEALS AND AT BEDTIME. TAKE ACCORDING TO SLIDING SCALE FOR BS 180-200 USE 2 UNITS, 201-22O USE 4 UNITS, 221-240 USE 5 UNITS, 241-280 USE 6 UNITS, 281-320 USE 7 UNITS, 321-400 USE 8 UNITS, GREATER THAN 400 CALL DOCTOR          Complete current medication regimen includes:  Current Outpatient Medications   Medication Sig    Continuous Blood  Gluc Sensor (FREESTYLE LIBRE 3 SENSOR) MISC Use to monitor blood glucose continuously - change every 14 days    Insulin Degludec (TRESIBA FLEXTOUCH) 200 UNIT/ML SOPN Inject 70 Units into the skin every evening Adjust as directed.    omeprazole (PRILOSEC OTC) 20 MG tablet Take 1 tablet by mouth daily    Tirzepatide (MOUNJARO) 5 MG/0.5ML SOPN SC injection INJECT 0.5 ML SUBCUTANEOUSLY ONE TIME PER WEEK    lisinopril (PRINIVIL;ZESTRIL) 10 MG tablet Take 1 tablet by mouth daily    magnesium oxide (MAG-OX) 400 MG tablet Take 1 tablet by mouth 2 times daily    meclizine (ANTIVERT) 25 MG tablet TAKE 1 TABLET BY MOUTH THREE (3) TIMES DAILY AS NEEDED FOR DIZZINESS    montelukast (SINGULAIR) 10 MG tablet TAKE 1 TABLET BY MOUTH DAILY. INDICATIONS: CONTROLLER MEDICATION FOR ASTHMA    potassium chloride (KLOR-CON M) 20 MEQ extended release tablet Take 2 tablets by mouth daily     acyclovir (ZOVIRAX) 400 MG tablet Take 2 tablets by mouth 2 times daily    cyclobenzaprine (FLEXERIL) 10 MG tablet TAKE 1 TABLET BY MOUTH THREE TIMES A DAY AS NEEDED FOR MUSCLE SPASMS    atorvastatin (LIPITOR) 20 MG tablet Take 1 tablet by mouth daily    beclomethasone (QVAR REDIHALER) 80 MCG/ACT AERB inhaler Inhale 2 puffs into the lungs in the morning and 2 puffs in the evening.    pregabalin (LYRICA) 150 MG capsule Take 1 capsule by mouth 2 times daily for 180 days. Max Daily Amount: 300 mg    insulin lispro, 1 Unit Dial, (HUMALOG KWIKPEN) 100 UNIT/ML SOPN CHECK BLOOD GLUCOSE PRIOR TO MEALS AND AT BEDTIME. TAKE ACCORDING TO SLIDING SCALE FOR BS 180-200 USE 2 UNITS, 201-22O USE 4 UNITS, 221-240 USE 5 UNITS, 241-280 USE 6 UNITS, 281-320 USE 7 UNITS, 321-400 USE 8 UNITS, GREATER THAN 400 CALL DOCTOR    DULoxetine (CYMBALTA) 30 MG extended release capsule Take 1 capsule by mouth 2 times daily    Insulin Pen Needle 32G X 4 MM MISC 1 each by Does not apply route daily    BIOTIN PO Take 1 tablet by mouth daily    ZINC PO Take by mouth    albuterol sulfate HFA (PROVENTIL;VENTOLIN;PROAIR) 108 (90 Base) MCG/ACT inhaler Inhale 1 puff into the lungs every 4 hours as needed    albuterol (PROVENTIL) (2.5 MG/3ML) 0.083% nebulizer solution Inhale 3 mLs into the lungs every 4 hours as needed    vitamin D3 (CHOLECALCIFEROL) 125 MCG (5000 UT) TABS tablet Take 1 tablet by mouth daily    diphenhydrAMINE (BENADRYL) 25 MG capsule Take 1 capsule by mouth every 6 hours as needed    Specialty Vitamins Products (MAGNESIUM, AMINO ACID CHELATE,) 133 MG tablet Take 1 tablet by mouth 2 times daily    triamcinolone (KENALOG) 0.1 % cream APPLY TO AFFECTED AREA TWICE A DAY AS NEEDED FOR RASH    zolpidem (AMBIEN) 5 MG tablet Take 1 tablet by mouth.     No current facility-administered medications for this visit.     Allergies:  Allergies   Allergen Reactions    Penicillins Hives and Itching       Blood Glucose Monitoring (BGM) or CGM:  - Had  access to home glucometer/blood glucose log/CGM reader today:  {YES/NO:19726}  - Conducts/scans: ***   - Fasting BG today: ***  - Post-prandial:   -FBG: ***   -ac lunch: ***    -ac dinner: ***   -hs: ***  ROS:  Today, Pt endorses:  - {Symptoms of Hyperglycemia:17903:::1}  - {Symptoms of Hypoglycemia:17902:::1}    Lifestyle modification(s):  -     Medication Adherence/Access:  - Endorses adherence to current regimen?: {YES/NO:19726}    Vitals/Labs:  No results found for: "HBA1C"  Hemoglobin A1C   Date Value Ref Range Status   06/25/2022 9.8 (H) 4.8 - 5.6 % Final     Comment:                 Prediabetes: 5.7 - 6.4           Diabetes: >6.4           Glycemic control for adults with diabetes: <7.0     05/08/2022 9.1 (H) 4.2 - 5.6 % Final     Comment:     (NOTE)  HbA1C Interpretive Ranges  <5.7              Normal  5.7 - 6.4         Consider Prediabetes  >6.5              Consider Diabetes     01/21/2022 11.4 (H) 4.2 - 5.6 % Final     Comment:     (NOTE)  HbA1C Interpretive Ranges  <5.7              Normal  5.7 - 6.4         Consider Prediabetes  >6.5              Consider Diabetes         Screenings/Prevention Parameters:  -Diabetic Eye and Foot Exams:      Diabetes Management   Topic Date Due    Diabetic retinal exam  10/20/2021    A1C test (Diabetic or Prediabetic)  09/23/2022     -Microalbumin / Creatinine ratio:       Lab Results   Component Value Date/Time    MICROALBUR 1.51 01/23/2021 08:00 AM    LABCREA 67.4 06/25/2022 12:00 AM        Lab Results   Component Value Date    MALBCR <4 06/25/2022    MALBCR 12 01/23/2021    MALBCR 11 05/04/2020      No components found for: "MALBCREARAT"  -Immunizations:      Immunization History   Administered Date(s) Administered    COVID-19, MODERNA BLUE border, Primary or Immunocompromised, (age 12y+), IM, 100 mcg/0.1mL 06/16/2019, 07/14/2019    COVID-19, MODERNA Bivalent, (age 12y+), IM, 50 mcg/0.5 mL 09/13/2021    COVID-19, MODERNA, (2023-24 formula), (age 12y+), IM,  17mcg/0.5mL 02/18/2022, 07/25/2022    COVID-19, PFIZER Bivalent, DO NOT Dilute, (age 12y+), IM, 30 mcg/0.3 mL 01/29/2021    COVID-19, PFIZER PURPLE top, DILUTE for use, (age 49 y+), 25mcg/0.3mL 03/12/2020, 10/22/2020    Influenza Trivalent 02/27/2018, 02/23/2019, 03/01/2020    Influenza Virus Vaccine 02/18/2022    Influenza, FLUARIX, FLULAVAL, FLUZONE (age 81 mo+) AND AFLURIA, (age 84 y+), PF, 0.62mL 01/23/2021    Pneumococcal, PCV20, PREVNAR 20, (age 81w+), IM, 0.38mL 01/23/2021    Pneumococcal, PPSV23, PNEUMOVAX 23, (age 2y+), SC/IM, 0.63mL 03/18/2019    TDaP, ADACEL (age 3y-64y), BOOSTRIX (age 10y+), IM, 0.86mL 03/18/2019    Zoster Recombinant (Shingrix) 09/13/2021       Additional Laboratory Parameters of Interest:   Estimation of renal function:  Lab Results   Component Value Date/Time    LABGLOM 66 06/25/2022 12:00 AM    LABGLOM >60 05/08/2022 01:28 PM  LABGLOM >60 01/21/2022 03:21 PM    LABGLOM 57 05/29/2021 02:59 PM    GFRAA >60 01/12/2021 11:49 AM    GFRAA >60 11/22/2020 11:12 AM    GFRAA >60 08/10/2020 03:38 PM     Wt Readings from Last 3 Encounters:   06/26/22 94.3 kg (208 lb)   02/20/22 93.4 kg (206 lb)   01/23/21 95.7 kg (211 lb)     Ht Readings from Last 1 Encounters:   06/26/22 1.727 m (5\' 8" )     Calculated estimated creatinine clearance: CrCl cannot be calculated (Unknown ideal weight.).    Vital Signs Today:    There were no vitals taken for this visit.    There are no discontinued medications.    No orders of the defined types were placed in this encounter.      Future Appointments   Date Time Provider Department Center   09/18/2022 10:00 AM Jacqualine Mau, [facility] BS AMB   09/24/2022 11:15 AM [facility] LAB VISIT [facility] BS AMB   10/09/2022  2:30 PM Sarris, Vinetta Bergamo, MD [facility] AMB       Patient verbalized understanding of the information presented and all of the patient's questions were answered.  AVS was handed to the patient. Patient advised to call the office with any additional questions or  concerns.    Notifications of recommendations will be sent to [place] Pink, MD for review.      Thank you for the consult,  Estill Cotta, PharmD, BCACP, BC-ADM        {[facility] [employer]

## 2022-09-21 MED ORDER — MONTELUKAST SODIUM 10 MG PO TABS
10 MG | ORAL_TABLET | ORAL | 1 refills | Status: DC
Start: 2022-09-21 — End: 2023-08-06

## 2022-09-24 ENCOUNTER — Ambulatory Visit: Payer: BLUE CROSS/BLUE SHIELD | Primary: Internal Medicine

## 2022-09-24 ENCOUNTER — Encounter

## 2022-09-25 ENCOUNTER — Encounter: Payer: BLUE CROSS/BLUE SHIELD | Primary: Internal Medicine

## 2022-09-26 MED ORDER — TRESIBA FLEXTOUCH 200 UNIT/ML SC SOPN
200 | Freq: Every evening | SUBCUTANEOUS | 1 refills | Status: DC
Start: 2022-09-26 — End: 2022-10-29

## 2022-09-26 MED ORDER — LISINOPRIL 10 MG PO TABS
10 | ORAL_TABLET | Freq: Every day | ORAL | 3 refills | Status: AC
Start: 2022-09-26 — End: ?

## 2022-09-26 NOTE — Telephone Encounter (Signed)
PCP: [place] Pink, MD    LAST REFILL PER CHART:  Medication:Insulin Degludec (TRESIBA FLEXTOUCH) 200 UNIT/ML SOPN   Ordered On:08/28/2022  Instructions:Inject 70 Units into the skin every evening Adjust as directed   Dispense:79mL  Refills:1      LAST REFILL PER CHART:  Medication:lisinopril (PRINIVIL;ZESTRIL) 10 MG tablet   Ordered On:06/26/2022  Instructions:Take 1 tablet by mouth daily   Dispense:90 tablets  Refills:3      Future Appointments   Date Time Provider Department Center   10/09/2022  2:30 PM V[place]a Pink, MD [facility] BS AMB

## 2022-10-09 ENCOUNTER — Encounter: Payer: BLUE CROSS/BLUE SHIELD | Attending: Internal Medicine | Primary: Internal Medicine

## 2022-10-29 MED ORDER — TRESIBA FLEXTOUCH 200 UNIT/ML SC SOPN
200 | Freq: Every evening | SUBCUTANEOUS | 1 refills | Status: AC
Start: 2022-10-29 — End: ?

## 2022-10-29 NOTE — Telephone Encounter (Signed)
Patient overdue for visit with labs prior and no showed for her last appointment on 10/09/2022.  Please attempt to schedule over the next several weeks and stress importance of keeping lab appointment and visit appointment.

## 2022-10-29 NOTE — Telephone Encounter (Signed)
PCP: Smithton Pink, MD    LAST REFILL PER CHART:  Medication:Insulin Degludec (TRESIBA FLEXTOUCH) 200 UNIT/ML SOPN   Ordered On:09/26/2022  Instructions:Inject 70 Units into the skin every evening Adjust as directed   Dispense:18 mL  Refills:1      No future appointments.

## 2022-11-10 ENCOUNTER — Encounter

## 2022-11-10 NOTE — Telephone Encounter (Signed)
Patient needs to schedule a follow-up visit with labs prior.  She no showed on 10/09/2022.  Please stress need to keep appointment when scheduling.

## 2022-11-11 MED ORDER — INSULIN LISPRO (1 UNIT DIAL) 100 UNIT/ML SC SOPN
100 UNIT/ML | SUBCUTANEOUS | 5 refills | Status: DC
Start: 2022-11-11 — End: 2023-09-08

## 2022-12-13 NOTE — Telephone Encounter (Signed)
[facility]- appt needed

## 2022-12-19 NOTE — Telephone Encounter (Signed)
Called pt to get scheduled, no answer [facility]. Also sent a MyChart scheduling ticket for ov/labs.

## 2023-01-14 MED ORDER — TRESIBA FLEXTOUCH 200 UNIT/ML SC SOPN
200 | Freq: Every evening | SUBCUTANEOUS | 1 refills | Status: DC
Start: 2023-01-14 — End: 2023-02-25

## 2023-01-14 NOTE — Telephone Encounter (Signed)
 PCP: Chet Heron [name], MD    LAST REFILL PER CHART:  Medication:Insulin  Degludec (TRESIBA  FLEXTOUCH) 200 UNIT/ML SOPN   Ordered On:10/29/2022  Instructions:Inject 70 Units into the skin every evening Adjust as directed.   Dispense:18 mL  Refills:1      No future appointments.

## 2023-01-14 NOTE — Telephone Encounter (Signed)
Patient is overdue for an appointment.  Please schedule with labs prior.

## 2023-01-30 NOTE — Telephone Encounter (Signed)
Pt called stating she is having se rever pain in her pinky toe - she thinks it might be gout , she is asking if she can get a virtual appt today with you

## 2023-01-30 NOTE — Telephone Encounter (Signed)
Called patient, gave information.

## 2023-02-25 MED ORDER — TRESIBA FLEXTOUCH 200 UNIT/ML SC SOPN
200 UNIT/ML | Freq: Every evening | SUBCUTANEOUS | 0 refills | Status: AC
Start: 2023-02-25 — End: 2023-04-06

## 2023-02-25 NOTE — Telephone Encounter (Signed)
Patient is overdue for an appointment in order to keep receiving refills.  She no showed her last appointment in 09/2022.  Please schedule with labs prior.

## 2023-03-04 NOTE — Telephone Encounter (Signed)
-----   Message from Plato O sent at 03/04/2023 11:38 AM EDT -----  Regarding: [facility] Referral Request  [facility] Referral Request    Reason for referral request: Lab/Test Order    Specialist/Lab/Test patient is requesting (if known): blood work     Specialist Phone Number (if applicable): none     Additional Information : The patient is requesting for an order for a blood work because she will be having her physical appointment on March 20, 2023 at 12 pm .   --------------------------------------------------------------------------------------------------------------------------    Relationship to Patient: Self     Call Back Information: OK to leave message on voicemail  Preferred Call Back Number: Phone  [phone]

## 2023-03-04 NOTE — Telephone Encounter (Signed)
Mychart message sent.

## 2023-03-09 MED ORDER — MOUNJARO 5 MG/0.5ML SC SOAJ
5 MG/0.ML | SUBCUTANEOUS | 1 refills | Status: DC
Start: 2023-03-09 — End: 2023-09-08

## 2023-03-18 ENCOUNTER — Encounter

## 2023-03-18 MED ORDER — MECLIZINE HCL 25 MG PO TABS
25 MG | ORAL_TABLET | ORAL | 1 refills | Status: DC
Start: 2023-03-18 — End: 2023-06-10

## 2023-03-20 ENCOUNTER — Encounter

## 2023-03-20 ENCOUNTER — Encounter: Admit: 2023-03-20 | Discharge: 2023-03-20 | Payer: MEDICARE | Attending: Internal Medicine | Primary: Internal Medicine

## 2023-03-20 VITALS — BP 134/80 | HR 86 | Temp 98.30000°F | Resp 16 | Ht 68.0 in | Wt 211.0 lb

## 2023-03-20 DIAGNOSIS — E1140 Type 2 diabetes mellitus with diabetic neuropathy, unspecified: Secondary | ICD-10-CM

## 2023-03-20 DIAGNOSIS — E1165 Type 2 diabetes mellitus with hyperglycemia: Secondary | ICD-10-CM

## 2023-03-20 MED ORDER — MAGNESIUM OXIDE 400 MG PO TABS
400 | ORAL_TABLET | Freq: Two times a day (BID) | ORAL | 3 refills | Status: AC
Start: 2023-03-20 — End: ?

## 2023-03-20 NOTE — Patient Instructions (Signed)
Heart-Healthy Diet: Care Instructions  Your Care Instructions     A heart-healthy diet has lots of vegetables, fruits, nuts, beans, and whole grains, and is low in salt. It limits foods that are high in saturated fat, such as meats, cheeses, and fried foods. It may be hard to change your diet, but even small changes can lower your risk of heart attack and heart disease.  Follow-up care is a key part of your treatment and safety. Be sure to make and go to all appointments, and call your doctor if you are having problems. It's also a good idea to know your test results and keep a list of the medicines you take.  How can you care for yourself at home?  Watch your portions  Learn what a serving is. A "serving" and a "portion" are not always the same thing. Make sure that you are not eating larger portions than are recommended. For example, a serving of pasta is  cup. A serving size of meat is 2 to 3 ounces. A 3-ounce serving is about the size of a deck of cards. Measure serving sizes until you are good at "eyeballing" them. Keep in mind that restaurants often serve portions that are 2 or 3 times the size of one serving.  To keep your energy level up and keep you from feeling hungry, eat often but in smaller portions.  Eat only the number of calories you need to stay at a healthy weight. If you need to lose weight, eat fewer calories than your body burns (through exercise and other physical activity).  Eat more fruits and vegetables  Eat a variety of fruit and vegetables every day. Dark green, deep orange, red, or yellow fruits and vegetables are especially good for you. Examples include spinach, carrots, peaches, and berries.  Keep carrots, celery, and other veggies handy for snacks. Buy fruit that is in season and store it where you can see it so that you will be tempted to eat it.  Cook dishes that have a lot of veggies in them, such as stir-fries and soups.  Limit saturated and trans fat  Read food labels, and try  to avoid saturated and trans fats. They increase your risk of heart disease.  Use olive or canola oil when you cook.  Bake, broil, grill, or steam foods instead of frying them.  Choose lean meats instead of high-fat meats such as hot dogs and sausages. Cut off all visible fat when you prepare meat.  Eat fish, skinless poultry, and meat alternatives such as soy products instead of high-fat meats. Soy products, such as tofu, may be especially good for your heart.  Choose low-fat or fat-free milk and dairy products.  Eat foods high in fiber  Eat a variety of grain products every day. Include whole-grain foods that have lots of fiber and nutrients. Examples of whole-grain foods include oats, whole wheat bread, and brown rice.  Buy whole-grain breads and cereals, instead of white bread or pastries.  Limit salt and sodium  Limit how much salt and sodium you eat to help lower your blood pressure.  Taste food before you salt it. Add only a little salt when you think you need it. With time, your taste buds will adjust to less salt.  Eat fewer snack items, fast foods, and other high-salt, processed foods. Check food labels for the amount of sodium in packaged foods.  Choose low-sodium versions of canned goods (such as soups, vegetables, and beans).  Limit  sugar  Limit drinks and foods with added sugar. These include candy, desserts, and soda pop.  Limit alcohol  Limit alcohol to no more than 2 drinks a day for men and 1 drink a day for women. Too much alcohol can cause health problems.  When should you call for help?  Watch closely for changes in your health, and be sure to contact your doctor if:    You would like help planning heart-healthy meals.   Where can you learn more?  Go to [email or web address]  Enter V137 in the search box to learn more about "Heart-Healthy Diet: Care Instructions."  Current as of: January 01, 2018               Content Version: 12.6   2006-2020 [employer].    Care instructions adapted under license by [employer] (which disclaims liability or warranty for this information). If you have questions about a medical condition or this instruction, always ask your healthcare professional. [employer] disclaims any warranty or liability for your use of this information.    A Healthy Lifestyle: Care Instructions  A healthy lifestyle can help you feel good, have more energy, and stay at a weight that's healthy for you. You can share a healthy lifestyle with your friends and family. And you can do it on your own.    Eat meals with your friends or family. You could try cooking together.    Plan activities with other people. Go for a walk with a friend, try a free online fitness class, or join a sports league.    Eat a variety of healthy foods. These include fruits, vegetables, whole grains, low-fat dairy, and lean protein.    Choose healthy portions of food. You can use the Nutrition Facts label on food packages as a guide.    Eat more fruits and vegetables. You could add vegetables to sandwiches or add fruit to cereal.    Drink water when you are thirsty. Limit soda, juice, and sports drinks.    Try to exercise most days. Aim for at least 2 hours of exercise each week.    Keep moving. Work in the garden or take your dog on a walk. Use the stairs instead of the elevator.    If you use tobacco or nicotine, try to quit. Ask your doctor about programs and medicines to help you quit.    Limit alcohol. Men should have no more than 2 drinks a day. Women should have no more than 1. For some people, no alcohol is the best choice.  Follow-up care is a key part of your treatment and safety. Be sure to make and go to all appointments, and call your doctor if you are having problems. It's also a good idea to know your test results and keep a list of the medicines you take.  Where can you learn more?  Go to [email or web address] and enter U807 to learn  more about "A Healthy Lifestyle: Care Instructions."  Current as of: December 16, 2021               Content Version: 14.0   2006-2024 [employer].   Care instructions adapted under license by [facility]. If you have questions about a medical condition or this instruction, always ask your healthcare professional. [employer] disclaims any warranty or liability for your use of this information.      Learning About Diabetes Food Guidelines  Your Care Instructions  Meal planning is important to manage diabetes. It helps keep your blood sugar at a target level (which you set with your doctor). You don't have to eat special foods. You can eat what your family eats, including sweets once in a while. But you do have to pay attention to how often you eat and how much you eat of certain foods.  You may want to work with a dietitian or a certified diabetes educator (CDE) to help you plan meals and snacks. A dietitian or CDE can also help you lose weight if that is one of your goals.  What should you know about eating carbs?  Managing the amount of carbohydrate (carbs) you eat is an important part of healthy meals when you have diabetes. Carbohydrate is found in many foods.  Learn which foods have carbs. And learn the amounts of carbs in different foods.  Bread, cereal, pasta, and rice have about 15 grams of carbs in a serving. A serving is 1 slice of bread (1 ounce),  cup of cooked cereal, or 1/3 cup of cooked pasta or rice.  Fruits have 15 grams of carbs in a serving. A serving is 1 small fresh fruit, such as an apple or orange;  of a banana;  cup of cooked or canned fruit;  cup of fruit juice; 1 cup of melon or raspberries; or 2 tablespoons of dried fruit.  Milk and no-sugar-added yogurt have 15 grams of carbs in a serving. A serving is 1 cup of milk or 2/3 cup of no-sugar-added yogurt.  Starchy vegetables have 15 grams of carbs in a serving. A serving is  cup of mashed potatoes or sweet  potato; 1 cup winter squash;  of a small baked potato;  cup of cooked beans; or  cup cooked corn or green peas.  Learn how much carbs to eat each day and at each meal. A dietitian or CDE can teach you how to keep track of the amount of carbs you eat. This is called carbohydrate counting.  If you are not sure how to count carbohydrate grams, use the Plate Method to plan meals. It is a good, quick way to make sure that you have a balanced meal. It also helps you spread carbs throughout the day.  Divide your plate by types of foods. Put non-starchy vegetables on half the plate, meat or other protein food on one-quarter of the plate, and a grain or starchy vegetable in the final quarter of the plate. To this you can add a small piece of fruit and 1 cup of milk or yogurt, depending on how many carbs you are supposed to eat at a meal.  Try to eat about the same amount of carbs at each meal. Do not "save up" your daily allowance of carbs to eat at one meal.  Proteins have very little or no carbs per serving. Examples of proteins are beef, chicken, [place], fish, eggs, tofu, cheese, cottage cheese, and peanut butter. A serving size of meat is 3 ounces, which is about the size of a deck of cards. Examples of meat substitute serving sizes (equal to 1 ounce of meat) are 1/4 cup of cottage cheese, 1 egg, 1 tablespoon of peanut butter, and  cup of tofu.  How can you eat out and still eat healthy?  Learn to estimate the serving sizes of foods that have carbohydrate. If you measure food at home, it will be easier to estimate the amount in a serving of restaurant food.  If the meal you order has too much carbohydrate (such as potatoes, corn, or baked beans), ask to have a low-carbohydrate food instead. Ask for a salad or green vegetables.  If you use insulin, check your blood sugar before and after eating out to help you plan how much to eat in the future.  If you eat more carbohydrate at a meal than you had planned, take a walk  or do other exercise. This will help lower your blood sugar.  What else should you know?  Limit saturated fat, such as the fat from meat and dairy products. This is a healthy choice because people who have diabetes are at higher risk of heart disease. So choose lean cuts of meat and nonfat or low-fat dairy products. Use olive or canola oil instead of butter or shortening when cooking.  Don't skip meals. Your blood sugar may drop too low if you skip meals and take insulin or certain medicines for diabetes.  Check with your doctor before you drink alcohol. Alcohol can cause your blood sugar to drop too low. Alcohol can also cause a bad reaction if you take certain diabetes medicines.  Follow-up care is a key part of your treatment and safety. Be sure to make and go to all appointments, and call your doctor if you are having problems. It's also a good idea to know your test results and keep a list of the medicines you take.  Where can you learn more?  Go to [email or web address]  Enter I147 in the search box to learn more about "Learning About Diabetes Food Guidelines."  Current as of: May 01, 2018               Content Version: 12.6   2006-2020 [employer].   Care instructions adapted under license by [employer] (which disclaims liability or warranty for this information). If you have questions about a medical condition or this instruction, always ask your healthcare professional. [employer] disclaims any warranty or liability for your use of this information.

## 2023-03-20 NOTE — Progress Notes (Signed)
HPI:   [name] Wu is a 62 y.o. year old female who presents today for a physical exam..  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  She reports that she is doing reasonably well.      She states that she has been lost for follow-up since 06/2022 due to difficulty with her insurance coverage.  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  She states that she was due for her annual follow-up in 01/2023.    She reports that she is continuing to be compliant with her medications and states that she is on Mounjaro 5 mg weekly and Tresiba 70 units every evening.  She states that she also will use Humalog as needed.  She reports that she is monitoring her blood sugar with the Freestyle libre 3 CGM.  She established care with Pharm.D. Dail on 07/08/2022 and had one follow-up visit with him on 08/28/2022. However, she was lost to follow-up since that time due to insurance issues but expresses that she wishes to reestablish under his care.    She states today that she feels her peripheral neuropathy symptoms have improved on Cymbalta 30 mg twice daily and she is only taking pregabalin 150 mg at bedtime due to starting new employment since she states it makes her too drowsy during the day.  She reports that she did develop acute left fifth toe pain and swelling approximately 4 weeks ago and was evaluated at [facility].  She states that they obtained blood work and diagnosed her with gout.  She was prescribed colchicine but did not pick it up from the pharmacy.   She has been taking ibuprofen intermittently and reports overall improvement although admits to ongoing mild discomfort.    She discusses that her asthma control has significantly improved on Qvar, Singulair and albuterol as needed.  She denies any recent exacerbation.      She is otherwise without new complaints.        Summary of prior hospitalizations and medical history:   She has a history of acute lymphoblastic leukemia, diagnosed in 02/2017 after experiencing severe fatigue and bone pain for several months. She was positive for the [place] chromosome mutation. She presented to the [facility] in [place] for care, and underwent chemotherapy which achieved remission. She subsequently underwent a stem cell transplant on 08/14/2017, felt to be curative. She is now on maintenance therapy with a tyrosine kinase inhibitor, nilotimib 200 mg bid. She is followed by Dr. Skeet Latch , transplant oncologist, and had been traveling to [facility] every three months for care. She had a follow-up visit with Dr. Karn Cassis in 12/2020 and treatment with nilotinib was discontinued and follow-up recommended on an annual basis.      He has a history of hypertension, treated with amlodipine. She has a history of hyperlipidemia, and was restarted on atorvastatin in 07/2019.  She also has a history of diabetes mellitus, currently being treated with Tresiba, Humalog, and Ozempic. She denies any polyuria, polydipsia, nocturia, or blurry vision, and has no history of retinopathy or nephropathy. 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 She was under the care of pain management and being treated with Cymbalta and Roxicodone as needed, but has noted a significant decrease in the need for narcotics since being started on pregabalin in 02/2020.     She has a history of asthma since childhood, and is being maintained on Flovent and albuterol as needed. She denies  any cough or shortness of breath.      In 04/2018, she was admitted to [facility] from 12/4-12/10/2017 for a UTI and then subsequently readmitted from 12/6-12/17/2019 with MRSE bacteremia related to a PICC line and bibasilar pneumonia.      He has a history of GERD, treated with Protonix. She also underwent a screening colonoscopy on 05/14/2018 by Dr. Paulita Cradle at the [facility] in [place] to evaluate diarrhea post stem cell transplant.  No lesions were found. She denies any abdominal pain, nausea, vomiting, melena, hematochezia, or change in bowel movements.       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)     H/O stem cell transplant (HCC) 041/04/19    Hyperlipidemia 03/22/2019    Hypertension     Insomnia 03/22/2019    Mild intermittent asthma without complication 03/18/2019    Peripheral neuropathy due to chemotherapy (HCC) 03/18/2019     Past Surgical History:   Procedure Laterality Date    XR MIDLINE EQUAL OR GREATER THAN 5 YEARS  04/22/2018    XR MIDLINE EQUAL OR GREATER THAN 5 YEARS 04/22/2018     Current Outpatient Medications   Medication Sig    magnesium oxide (MAG-OX) 400 MG tablet Take 2 tablets by mouth 2 times daily    meclizine (ANTIVERT) 25 MG tablet TAKE ONE TABLET BY MOUTH THREE TIMES A DAY AS NEEDED FOR DIZZINESS    Tirzepatide (MOUNJARO) 5 MG/0.5ML SOAJ Inject 5 mg into the skin every 7 days    Insulin Degludec (TRESIBA FLEXTOUCH) 200 UNIT/ML SOPN INJECT 70 UNITS INTO THE SKIN EVERY EVENING ADJUST AS DIRECTED.    insulin lispro, 1 Unit Dial, (HUMALOG/ADMELOG) 100 UNIT/ML SOPN CHECK BLOOD GLUCOSE PRIOR TO MEALS AND AT BEDTIME. TAKE ACCORDING TO SLIDING SCALE FOR BS 180-200 USE 2 UNITS, 201-220 USE 4 UNITS, 221-240 USE 5 UNITS,  241-280 USE 6 UNITS, 281-320 USE 7 UNITS, 321-400 USE 8 UNITS, GREATER THAN 400 CALL DOCTOR    lisinopril (PRINIVIL;ZESTRIL) 10 MG tablet Take 1 tablet by mouth daily    montelukast (SINGULAIR) 10 MG tablet TAKE 1 TABLET BY MOUTH DAILY. INDICATIONS: CONTROLLER MEDICATION FOR ASTHMA    Continuous Blood Gluc Sensor (FREESTYLE LIBRE 3 SENSOR) MISC Use to monitor blood glucose continuously - change every 14 days    omeprazole (PRILOSEC OTC) 20 MG tablet Take 1 tablet by mouth daily    potassium chloride (KLOR-CON M) 20 MEQ extended release tablet Take 2 tablets by mouth daily    cyclobenzaprine (FLEXERIL) 10 MG tablet TAKE 1 TABLET BY MOUTH THREE TIMES A DAY AS NEEDED FOR MUSCLE SPASMS    atorvastatin (LIPITOR) 20 MG tablet Take 1 tablet by mouth daily    beclomethasone (QVAR REDIHALER) 80 MCG/ACT AERB inhaler Inhale 2  puffs into the lungs in the morning and 2 puffs in the evening.    DULoxetine (CYMBALTA) 30 MG extended release capsule Take 1 capsule by mouth 2 times daily    Insulin Pen Needle 32G X 4 MM MISC 1 each by Does not apply route daily    BIOTIN PO Take 1 tablet by mouth daily    ZINC PO Take by mouth    albuterol sulfate HFA (PROVENTIL;VENTOLIN;PROAIR) 108 (90 Base) MCG/ACT inhaler Inhale 1 puff into the lungs every 4 hours as needed    albuterol (PROVENTIL) (2.5 MG/3ML) 0.083% nebulizer solution Inhale 3 mLs into the lungs every 4 hours as needed    vitamin D3 (CHOLECALCIFEROL) 125 MCG (5000 UT) TABS tablet Take 1 tablet by mouth daily    diphenhydrAMINE (BENADRYL) 25 MG capsule Take 1 capsule by mouth every 6 hours as needed    Specialty Vitamins Products (MAGNESIUM, AMINO ACID CHELATE,) 133 MG tablet Take 1 tablet by mouth 2 times daily    triamcinolone (KENALOG) 0.1 % cream APPLY TO AFFECTED AREA TWICE A DAY AS NEEDED FOR RASH    zolpidem (AMBIEN) 5 MG tablet Take 1 tablet by mouth.    pregabalin (LYRICA) 150 MG capsule Take 1 capsule by mouth 2 times daily for 180 days. Max Daily Amount: 300 mg     No  current facility-administered medications for this visit.     Allergies and Intolerances:   Allergies   Allergen Reactions    Penicillins Hives and Itching     Family History:  She has no family history of colon cancer of leukemia. Mother had breast cancer at the age of 108.   Family History   Problem Relation Age of Onset    Breast Cancer Mother      Social History:   She  reports that she has never smoked. She has never used smokeless tobacco. She is divorced and has no children. She is a Research scientist (medical), and working virtually due to the pandemic.     Social History     Substance and Sexual Activity   Alcohol Use No     Immunization History:  Immunization History   Administered Date(s) Administered    COVID-19, MODERNA BLUE border, Primary or Immunocompromised, (age 12y+), IM, 100 mcg/0.36mL 06/16/2019, 07/14/2019    COVID-19, MODERNA Bivalent, (age 12y+), IM, 50 mcg/0.5 mL 09/13/2021    COVID-19, MODERNA, (age 12y+), IM, 3mcg/0.5mL 02/18/2022, 07/25/2022    COVID-19, PFIZER Bivalent, DO NOT Dilute, (age 12y+), IM, 30 mcg/0.3 mL 01/29/2021    COVID-19, PFIZER PURPLE top, DILUTE for use, (age 74 y+), 59mcg/0.3mL 03/12/2020, 10/22/2020    COVID-19, PFIZER, (age 12y+), IM, 45mcg/0.3mL 02/15/2023    Influenza Trivalent 02/27/2018, 02/23/2019, 03/01/2020    Influenza Virus Vaccine 02/18/2022    Influenza, FLUARIX, FLULAVAL, FLUZONE (age 25 mo+) and AFLURIA, (age 31 y+), Quadv PF, 0.45mL 01/23/2021, 02/15/2023    Pneumococcal, PCV20, PREVNAR 20, (age 25w+), IM, 0.26mL 01/23/2021    Pneumococcal, PPSV23, PNEUMOVAX 23, (age 2y+), SC/IM, 0.53mL 03/18/2019    TDaP, ADACEL (age 18y-64y), Leda Min (age 10y+), IM, 0.68mL 03/18/2019    Zoster Recombinant (Shingrix) 06/11/2021, 09/13/2021       Review of Systems:   As above included in HPI.  Otherwise 11 point review of systems negative including constitutional, skin, HENT, eyes, respiratory, cardiovascular, gastrointestinal, genitourinary, musculoskeletal, endocrine, hematologic, allergy,  and neurologic.    Physical:   Vitals:    03/20/23 1211   BP: (!) 148/88   Pulse: 86  Resp: 16   Temp: 98.3 F (36.8 C)   TempSrc: Temporal   SpO2: 97%   Weight: 95.7 kg (211 lb)   Height: 1.727 m (5\' 8" )       Exam:   Patient appears in no apparent distress. Affect is appropriate.    HEENT: PERRLA, anicteric, oropharynx clear, no JVD, adenopathy or thyromegaly. No carotid bruits or radiated murmur.  Lungs: clear to auscultation, no wheezes, rhonchi, or rales.  Heart: regular rate and rhythm. No murmur, rubs, gallops  Abdomen: soft, nontender, nondistended, normal bowel sounds, no hepatosplenomegaly or masses.   Extremities: without edema.        Review of Data:  Labs:  Office Visit on 03/20/2023   Component Date Value Ref Range Status    WBC 03/20/2023 6.8  3.4 - 10.8 x10E3/uL Final    RBC 03/20/2023 4.43  3.77 - 5.28 x10E6/uL Final    Hemoglobin 03/20/2023 13.9  11.1 - 15.9 g/dL Final    Hematocrit 57/84/6962 41.8  34.0 - 46.6 % Final    MCV 03/20/2023 94  79 - 97 fL Final    MCH 03/20/2023 31.4  26.6 - 33.0 pg Final    MCHC 03/20/2023 33.3  31.5 - 35.7 g/dL Final    RDW 95/28/4132 13.3  11.7 - 15.4 % Final    Platelets 03/20/2023 211  150 - 450 x10E3/uL Final    Neutrophils % 03/20/2023 56  Not Estab. % Final    Lymphocytes % 03/20/2023 36  Not Estab. % Final    Monocytes % 03/20/2023 6  Not Estab. % Final    Eosinophils % 03/20/2023 1  Not Estab. % Final    Basophils % 03/20/2023 1  Not Estab. % Final    Neutrophils Absolute 03/20/2023 3.8  1.4 - 7.0 x10E3/uL Final    Lymphocytes Absolute 03/20/2023 2.5  0.7 - 3.1 x10E3/uL Final    Monocytes Absolute 03/20/2023 0.4  0.1 - 0.9 x10E3/uL Final    Eosinophils Absolute 03/20/2023 0.1  0.0 - 0.4 x10E3/uL Final    Basophils Absolute 03/20/2023 0.1  0.0 - 0.2 x10E3/uL Final    Immature Granulocytes % 03/20/2023 0  Not Estab. % Final    Immature Grans (Abs) 03/20/2023 0.0  0.0 - 0.1 x10E3/uL Final    Hemoglobin A1C 03/20/2023 9.2 (H)  4.8 - 5.6 % Final    Glucose  03/20/2023 205 (H)  70 - 99 mg/dL Final    BUN 44/05/270 9  8 - 27 mg/dL Final    Creatinine 53/66/4403 0.88  0.57 - 1.00 mg/dL Final    Est, Glom Filt Rate 03/20/2023 74  >59 mL/min/1.73 Final    BUN/Creatinine Ratio 03/20/2023 10 (L)  12 - 28 Final    Sodium 03/20/2023 141  134 - 144 mmol/L Final    Potassium 03/20/2023 4.6  3.5 - 5.2 mmol/L Final    Chloride 03/20/2023 101  96 - 106 mmol/L Final    CO2 03/20/2023 25  20 - 29 mmol/L Final    Calcium 03/20/2023 9.9  8.7 - 10.3 mg/dL Final    Total Protein 03/20/2023 6.8  6.0 - 8.5 g/dL Final    Albumin 47/42/5956 4.4  3.9 - 4.9 g/dL Final    Globulin, Total 03/20/2023 2.4  1.5 - 4.5 g/dL Final    Total Bilirubin 03/20/2023 0.7  0.0 - 1.2 mg/dL Final    Alkaline Phosphatase 03/20/2023 132 (H)  44 - 121 IU/L Final    AST 03/20/2023 27  0 - 40 IU/L Final    ALT 03/20/2023 31  0 - 32 IU/L Final    Cholesterol, Total 03/20/2023 130  100 - 199 mg/dL Final    Triglycerides 03/20/2023 168 (H)  0 - 149 mg/dL Final    HDL 72/53/6644 40  >39 mg/dL Final    VLDL Cholesterol Calculated 03/20/2023 28  5 - 40 mg/dL Final    LDL Cholesterol 03/20/2023 62  0 - 99 mg/dL Final    Uric Acid 03/47/4259 3.9  3.0 - 7.2 mg/dL Final    Magnesium 56/38/7564 2.1  1.6 - 2.3 mg/dL Final    Vit D, 33-IRJJOAC 03/20/2023 43.2  30.0 - 100.0 ng/mL Final    Specific Gravity, UA 03/20/2023 1.017  1.005 - 1.030 Final    pH, Urine 03/20/2023 6.5  5.0 - 7.5 Final    Color, UA 03/20/2023 Yellow  Yellow Final    Appearance 03/20/2023 Clear  Clear Final    Leukocyte Esterase, Urine 03/20/2023 2+ (A)  Negative Final    Protein, UA 03/20/2023 Negative  Negative/Trace Final    Glucose, Ur 03/20/2023 Negative  Negative Final    Ketones, Urine 03/20/2023 Negative  Negative Final    Blood, Urine 03/20/2023 Negative  Negative Final    Bilirubin, Urine 03/20/2023 Negative  Negative Final    Urobilinogen, Urine 03/20/2023 0.2  0.2 - 1.0 mg/dL Final    Nitrite, Urine 03/20/2023 Negative  Negative Final     Microscopic Examination 03/20/2023 See additional order   Final    WBC, UA 03/20/2023 0-5  0 - 5 /hpf Final    RBC, UA 03/20/2023 0-2  0 - 2 /hpf Final    Epithelial Cells, UA 03/20/2023 0-10  0 - 10 /hpf Final    Casts UA 03/20/2023 None seen  None seen /lpf Final    Crystals, UA 03/20/2023 Present (A)  N/A Final    Crystal Type 03/20/2023 Calcium Oxalate  N/A Final    Bacteria, UA 03/20/2023 Moderate (A)  None seen/Few Final    Creatinine, Ur 03/20/2023 96.8  Not Estab. mg/dL Final    Microalb, Ur 16/60/6301 9.3  Not Estab. ug/mL Final    Microalb/Creat Ratio 03/20/2023 10  0 - 29 mg/g creat Final            Health Maintenance:  Screening:               Mammogram: Right mammo/ultrasound negative (10/2020); Bilateral due in 01/2021  OVERDUE              PAP smear: well woman exam with NP Daphane Shepherd (Pap/HPV negative on 02/15/2020)              Colorectal: colonoscopy (05/14/2018) normal.  Dr. Paulita Cradle in [place].  Due 05/2028.              Depression: none              DM (HbA1c/FPG): FPG 205/HbA1c 9.2 (03/2023)              Hepatitis C: negative (03/2019)              Falls: none              DEXA: unknown              Glaucoma: regular eye exams with Dr. Octavio Graves (last 10/2020) OVERDUE              Smoking: none  Vitamin D: 43.2 (03/2023)              Medicare Wellness: 02/20/2022  DUE       Impression:  Patient Active Problem List   Diagnosis    Peripheral neuropathy due to chemotherapy (HCC)    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HCC)    Essential hypertension    Type 2 diabetes mellitus, with long-term current use of insulin (HCC)    S/P bone marrow transplant (HCC)    Eczema    Elevated alkaline phosphatase level    Primary osteoarthritis involving multiple joints    Insomnia    GERD (gastroesophageal reflux disease)    Vestibular neuronitis    Hyperlipidemia    Class 1 obesity due to excess calories with serious comorbidity in adult    Imbalance    Mild intermittent asthma       Plan:  1. Diabetes  mellitus. Previously well controlled on regimen of Tresiba and SS Humalog with HbA1c 6.5 in 03/2019. However, progressively worsened due to weight gain and difficulty obtaining GLP-1 agonist and insulin at times.  Now prescribed Freestyle 3 CGM to help with close monitoring of blood sugars.  Established care with Pharm.D. Dail on 07/08/2022 and had 1 follow-up appointment on 08/28/2022, but again lost to follow-up due to insurance issues.  Reports titrated Evaristo Bury on her own and currently taking 70 units daily and Mounjaro 5 mg weekly with HbA1c improved to 9.2 today.  Will have patient reestablish with Pharm.D. Dail to help with blood sugar control using CGM data.  Advised to titrate [place] dose to help with fasting blood glucose control with attempt to achieve FPG <150.  Also advised that would benefit from increase in dose of Mounjaro, but just filled prescription so advised to contact the office when next refill for Greggory Keen is due so that dose may be increased.  No evidence of microvascular complications. On statin and on lisinopril since 04/2020. Continue regular eye exams with Dr. Octavio Graves.  Overdue and urged to schedule.  Foot exam grossly normal (02/2022), and no evidence of microalbuminuria with urine microalbumin/ creatinine ratio at 10 mg/g. Emphasized importance of lifestyle modifications, including low carbohydrate diet, regular exercise, and weight loss.  Will reassess HbA1c at next visit.  2. Acute lymphoblastic leukemia, +[place] chromosone mutation, s/p stem cell transplant, in remission. Diagnosed in 02/2017 and underwent induction chemotherapy followed by stem cell transplant in 08/2017.  Had been on maintenance therapy with a TKI (nilotinib), but discontinued in 12/2020 by her oncologist. Being followed closely by Dr. Skeet Latch, transplant oncologist, at [facility] in [place].  Reported frequency of follow-up had been decreased to annually but unable to keep  appointment in [place] in 12/2022 due to insurance change.  CBC remains normal today.  Advised of need to reestablish with oncologist and will place referral to Dr. Brand Males.  Provided with phone number to schedule.  Will readdress at next visit.  3. Hypertension. Blood pressure remains well controlled on lisinopril 10 mg daily. Renal function normalized today at 0.88/eGFR 74.  On potassium and magnesium supplements due to long term difficulty with deficiency and levels remain stable today. Will continue to monitor.  4. Hyperlipidemia.  On moderate intensity dose atorvastatin with LDL 62 and HDL 40, indicative of excellent control. Emphasized importance of lifestyle modifications, including heart healthy diet, regular exercise, and weight loss. Continue to follow.  5. Peripheral neuropathy, painful. Secondary to chemotherapy used to treat ALL. Did not  tolerate gabapentin due to hallucinations. On Cymbalta 30 mg bid and was receiving Roxicodone through pain management.  However, weaned from Roxicodone in 02/2020 and started on pregabalin in 02/2021 and finding it to be very effective..  Previously on pregabalin 150 mg twice daily with good response, but reports only taking nightly due to difficulty with sedation effects while at work.  UDS/CSA up-to-date.  Advised to continue using Flexeril as needed for cramping.  Noting left foot pain involving small toe and reports diagnosed with gout at [facility].  However, did not pick up medication as prescribed and gradually improved. Uric acid today low normal at 3.9.  Unclear if diagnosis was correct.  Will place referral to podiatry for further evaluation given ongoing intermittent pain.   6. Asthma, mild intermittent.  Noting significant improvement since initiation of Singulair 10 mg daily following exacerbation in 03/2021.  Now on Qvar for maintenance therapy and albuterol as needed with good control.    7. GERD. On Protonix 40 mg bid with good control. Follow.  8.  Abnormal mammogram. Patient reports had previous biopsy of right breast which was benign. Screening mammogram (02/2019) showed multiple abnormalities in right breast. Right breast diagnostic mammogram and ultrasound (05/12/2019) showed probably benign findings, but recommendation was to proceed with 6 month follow-up. Underwent repeat diagnostic right mammogram and right breast ultrasound in 10/2020 and findings felt to be benign. Recommended continuing with annual follow-up and due for bilateral screening in 02/2021.  Order again placed in 06/2022 for bilateral mammogram and right breast ultrasound and urged to schedule.  Patient with FH history of breast cancer in her mother.  9. Elevated alkaline phosphatase. GGT elevated at 97 so likely of hepatic origin (04/2020). AMA negative (04/2020) and alkaline phosphatase isozymes with normal differential and predominant hepatic origin (04/2020).  Right upper quadrant ultrasound ordered but patient never obtained. Repeat alkaline phosphatase normalized to 107 in 01/2021 but mildly elevated today at 132. Suspect improvement related to discontinuation of nilotinib. Will continue to monitor.  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  Reported increasing difficulty with vertigo and stated that she had experienced multiple falls. Continuing difficulty with disequilibrium.  Referred to balance therapy in 01/2021 and again in 05/2021 but never scheduled due to cost. Advised use of meclizine as needed.  Did not attend balance therapy due to cost, but reports overall improvement in symptoms today.  11. Insomnia. Using Ambien +/- Benadryl as needed. Emphasized good sleep hygiene.  Continue to monitor.  12. Eczema. On Xyzal and will use Kenalog as needed.   13.  Chronic left trigger thumb. Evaluated by Dr. Andrey Campanile in 04/2019 and received a cortisone injection with improvement. Currently stable.  14.  Obesity. Weight increased nearly 20 pounds since 12/2019.  On Mounjaro but does not appear to be helping  with appetite suppression.  Emphasized importance of continuing with attempts lifestyle modifications, including heart healthy low carbohydrate diet, regular exercise, and weight loss.  15. Health maintenance. Completed 4 doses of the COVID 19 vaccine series due to immunosuppressed status and the bivalent Pfizer booster dose.  Received Prevnar 20.  Advised to obtain the Shingrix vaccine. Discussed recommended vaccines for the fall, including influenza vaccine, updated COVID vaccine, and new RSV vaccine. Other immunizations up-to-date.  Bilateral mammogram due in 02/2021 and still has not yet scheduled. Order placed in 06/2022 with baseline bone density study.  Urged again today to schedule.  Colonoscopy completed in [place] on 05/14/2018.  Normal so 10-year follow-up recommended.  Completed well woman exam  with NP Elder Negus and chart updated.  Also completed eye exam with Dr. Octavio Graves.  Urged to continue with annual exams discussed importance to schedule as overdue.  Vitamin D level remains normal on maintenance dose supplement. Emphasized importance of lifestyle modifications, including heart healthy diet, regular exercise, and weight loss. Medicare wellness visit due and will perform at next visit.      Patient understands recommendations and agrees with plan.  Follow-up in 3 months.      This visit required high complexity medically necessary decision making and management plans.     Time spent in preparing for the visit, including review of history, tests done prior to arrival, additional time reviewing clinical data, imaging, outside records and test results:  5  minutes.  Time spent in counseling with patient and/or family members regarding care plan: 35 minutes.  Time spent on same-day documentation, ordering tests, treatments, and referring patient for further care: 15 minutes.   Time spent on visit does not include time for documentation not completed on day of visit.

## 2023-03-20 NOTE — Progress Notes (Unsigned)
[name] Wu presents today for   Chief Complaint   Patient presents with    Annual Exam       "Have you been to the ER, urgent care clinic since your last visit?  Hospitalized since your last visit?"    Yes  October 2024  [facility]  Gout    "Have you seen or consulted any other health care providers outside of [facility] since your last visit?"    NO       Have you had a mammogram?"   NO    Date of last Mammogram: 10/27/2020

## 2023-03-21 LAB — CBC WITH AUTO DIFFERENTIAL
Basophils %: 1 %
Basophils Absolute: 0.1 10*3/uL (ref 0.0–0.2)
Eosinophils %: 1 %
Eosinophils Absolute: 0.1 10*3/uL (ref 0.0–0.4)
Hematocrit: 41.8 % (ref 34.0–46.6)
Hemoglobin: 13.9 g/dL (ref 11.1–15.9)
Immature Grans (Abs): 0 10*3/uL (ref 0.0–0.1)
Immature Granulocytes %: 0 %
Lymphocytes %: 36 %
Lymphocytes Absolute: 2.5 10*3/uL (ref 0.7–3.1)
MCH: 31.4 pg (ref 26.6–33.0)
MCHC: 33.3 g/dL (ref 31.5–35.7)
MCV: 94 fL (ref 79–97)
Monocytes %: 6 %
Monocytes Absolute: 0.4 10*3/uL (ref 0.1–0.9)
Neutrophils %: 56 %
Neutrophils Absolute: 3.8 10*3/uL (ref 1.4–7.0)
Platelets: 211 10*3/uL (ref 150–450)
RBC: 4.43 x10E6/uL (ref 3.77–5.28)
RDW: 13.3 % (ref 11.7–15.4)
WBC: 6.8 10*3/uL (ref 3.4–10.8)

## 2023-03-21 LAB — COMPREHENSIVE METABOLIC PANEL
ALT: 31 [IU]/L (ref 0–32)
AST: 27 [IU]/L (ref 0–40)
Albumin: 4.4 g/dL (ref 3.9–4.9)
Alkaline Phosphatase: 132 [IU]/L — ABNORMAL HIGH (ref 44–121)
BUN/Creatinine Ratio: 10 — ABNORMAL LOW (ref 12–28)
BUN: 9 mg/dL (ref 8–27)
CO2: 25 mmol/L (ref 20–29)
Calcium: 9.9 mg/dL (ref 8.7–10.3)
Chloride: 101 mmol/L (ref 96–106)
Creatinine: 0.88 mg/dL (ref 0.57–1.00)
Est, Glom Filt Rate: 74 mL/min/{1.73_m2} (ref >59)
Globulin, Total: 2.4 g/dL (ref 1.5–4.5)
Glucose: 205 mg/dL — ABNORMAL HIGH (ref 70–99)
Potassium: 4.6 mmol/L (ref 3.5–5.2)
Sodium: 141 mmol/L (ref 134–144)
Total Bilirubin: 0.7 mg/dL (ref 0.0–1.2)
Total Protein: 6.8 g/dL (ref 6.0–8.5)

## 2023-03-21 LAB — URINALYSIS WITH MICROSCOPIC
Bilirubin, Urine: NEGATIVE
Blood, Urine: NEGATIVE
Glucose, Ur: NEGATIVE
Ketones, Urine: NEGATIVE
Nitrite, Urine: NEGATIVE
Protein, UA: NEGATIVE
Specific Gravity, UA: 1.017 (ref 1.005–1.030)
Urobilinogen, Urine: 0.2 mg/dL (ref 0.2–1.0)
pH, Urine: 6.5 (ref 5.0–7.5)

## 2023-03-21 LAB — LIPID PANEL
Cholesterol, Total: 130 mg/dL (ref 100–199)
HDL: 40 mg/dL (ref >39)
LDL Cholesterol: 62 mg/dL (ref 0–99)
Triglycerides: 168 mg/dL — ABNORMAL HIGH (ref 0–149)
VLDL Cholesterol Calculated: 28 mg/dL (ref 5–40)

## 2023-03-21 LAB — URIC ACID: Uric Acid: 3.9 mg/dL (ref 3.0–7.2)

## 2023-03-21 LAB — MICROALBUMIN / CREATININE URINE RATIO
Creatinine, Ur: 96.8 mg/dL
Microalb, Ur: 9.3 ug/mL
Microalb/Creat Ratio: 10 mg/g{creat} (ref 0–29)

## 2023-03-21 LAB — MICROSCOPIC EXAMINATION: Casts UA: NONE SEEN /[LPF]

## 2023-03-21 LAB — HEMOGLOBIN A1C: Hemoglobin A1C: 9.2 % — ABNORMAL HIGH (ref 4.8–5.6)

## 2023-03-21 LAB — VITAMIN D 25 HYDROXY: Vit D, 25-Hydroxy: 43.2 ng/mL (ref 30.0–100.0)

## 2023-03-21 LAB — MAGNESIUM: Magnesium: 2.1 mg/dL (ref 1.6–2.3)

## 2023-04-03 ENCOUNTER — Encounter: Admit: 2023-04-03 | Admitting: Internal Medicine

## 2023-04-03 DIAGNOSIS — Z1231 Encounter for screening mammogram for malignant neoplasm of breast: Secondary | ICD-10-CM

## 2023-04-06 MED ORDER — TRESIBA FLEXTOUCH 200 UNIT/ML SC SOPN
200 | Freq: Every evening | SUBCUTANEOUS | 3 refills | 64.50000 days | Status: DC
Start: 2023-04-06 — End: 2023-12-12

## 2023-04-09 NOTE — Telephone Encounter (Signed)
 Patient contacted the office, she recently went to urgent care and was diagnosed with Viral strep throat and was prescribed naproxen and she states that it is not helping her and her throat is still very inflamed. She requested a VV with Dr Felipe Drone and I ma

## 2023-04-09 NOTE — Telephone Encounter (Signed)
 Called and advised patient to contact an urgent care facility.  vc

## 2023-04-09 NOTE — Telephone Encounter (Signed)
 Dr. Felipe Drone is not in the office this week, please have patient contact urgent care.

## 2023-05-18 MED ORDER — ATORVASTATIN CALCIUM 20 MG PO TABS
20 MG | ORAL_TABLET | Freq: Every day | ORAL | 3 refills | Status: DC
Start: 2023-05-18 — End: 2023-08-27

## 2023-05-21 ENCOUNTER — Encounter: Primary: Internal Medicine

## 2023-05-21 NOTE — Progress Notes (Deleted)
 Pharmacy Progress Note - Diabetes Management       Assessment / Plan:   Diabetes Management:  Per ADA guidelines, [name] A1c {IS/IS NOT:19932} at goal of < 7%.  ***    Recommendations to [place] Pink, MD:    - ***     Nutrition/Lifestyle Modifications:

## 2023-06-02 ENCOUNTER — Encounter: Primary: Internal Medicine

## 2023-06-02 NOTE — Progress Notes (Deleted)
Pharmacy Progress Note - Diabetes Management       Assessment / Plan:   Diabetes Management:  Per ADA guidelines, [name] A1c {IS/IS NOT:19932} at goal of < 7%.  ***    Recommendations to [place] Pink, MD:    - ***     Nutrition/Lifestyle Modifications:  - Educated pt on the importance of moderating carbohydrate intake. Reviewed sources of carbohydrates and method to help determine appropriate portion sizes (e.g., Diabetes Plate Method).  - Advised patient to avoid sugar-sweetened beverages and replace with water or diet/zero sugar option.  - Recommend ~30 minutes consistent, moderately intensive, exercise/day or ~150 minutes/week. Start small, stay consistent, and increase length and types of exercise, as tolerated.       Patient will return to clinic in *** week(s) for follow up.        S/O: [name], a 63 y.o. 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  Pt was seen today for diabetes management.  Patient's last A1c was:   Hemoglobin A1C   Date Value Ref Range Status   03/20/2023 9.2 (H) 4.8 - 5.6 % Final     Comment:                 Prediabetes: 5.7 - 6.4           Diabetes: >6.4           Glycemic control for adults with diabetes: <7.0         Interim update: Pt was last seen by me on 08/28/2022.  Per my prior note: [name] A1c is not at goal of < 7%.  [name] basal control is very poor at this time.  She is also having some large post-prandial elevations during her large, single meal of the day.  Encouraged to eat smaller, more frequent meals to take advantage of Mounjaro plus the SSI from Humalog.  Will increase her Tresiba to 70 units qhs and have her titrate by 2  units every 2 days until her FBG < 140 mg/dL.  Will maintain her current Mounjaro dose for now and will focus on basal control.  Will switch pt over to the [place] d/t her difficulty remembering to scan her Truitt Merle an adequate amount of times to provide 24hr data.  Will reassess with CGM data in 3 weeks.    Pt was lost to follow up.    Today:   ***    Current anti-hyperglycemic regimen includes:    Key Antihyperglycemic Medications               Insulin Degludec (TRESIBA FLEXTOUCH) 200 UNIT/ML SOPN INJECT 70 UNITS INTO THE SKIN EVERY EVENING ADJUST AS DIRECTED.    Tirzepatide (MOUNJARO) 5 MG/0.5ML SOAJ Inject 5 mg into the skin every 7 days    insulin lispro, 1 Unit Dial, (HUMALOG/ADMELOG) 100 UNIT/ML SOPN CHECK BLOOD GLUCOSE PRIOR TO MEALS AND AT BEDTIME. TAKE ACCORDING TO SLIDING SCALE FOR BS 180-200 USE 2 UNITS, 201-220 USE 4 UNITS, 221-240 USE 5 UNITS, 241-280 USE 6 UNITS, 281-320 USE 7 UNITS, 321-400 USE 8 UNITS, GREATER THAN 400 CALL DOCTOR          Complete current medication regimen includes:  Current Outpatient Medications  Medication Sig    atorvastatin (LIPITOR) 20 MG tablet TAKE 1 TABLET BY MOUTH EVERY DAY    Insulin Degludec (TRESIBA FLEXTOUCH) 200 UNIT/ML SOPN INJECT 70 UNITS INTO THE SKIN EVERY EVENING ADJUST AS DIRECTED.    magnesium oxide (MAG-OX) 400 MG tablet Take 2 tablets by mouth 2 times daily    meclizine (ANTIVERT) 25 MG tablet TAKE ONE TABLET BY MOUTH THREE TIMES A DAY AS NEEDED FOR DIZZINESS    Tirzepatide (MOUNJARO) 5 MG/0.5ML SOAJ Inject 5 mg into the skin every 7 days    insulin lispro, 1 Unit Dial, (HUMALOG/ADMELOG) 100 UNIT/ML SOPN CHECK BLOOD GLUCOSE PRIOR TO MEALS AND AT BEDTIME. TAKE ACCORDING TO SLIDING SCALE FOR BS 180-200 USE 2 UNITS, 201-220 USE 4 UNITS, 221-240 USE 5 UNITS, 241-280 USE 6 UNITS, 281-320 USE 7 UNITS, 321-400 USE 8 UNITS, GREATER THAN 400 CALL DOCTOR    lisinopril (PRINIVIL;ZESTRIL) 10 MG tablet Take 1 tablet by mouth daily    montelukast (SINGULAIR) 10 MG tablet  TAKE 1 TABLET BY MOUTH DAILY. INDICATIONS: CONTROLLER MEDICATION FOR ASTHMA    Continuous Blood Gluc Sensor (FREESTYLE LIBRE 3 SENSOR) MISC Use to monitor blood glucose continuously - change every 14 days    omeprazole (PRILOSEC OTC) 20 MG tablet Take 1 tablet by mouth daily    potassium chloride (KLOR-CON M) 20 MEQ extended release tablet Take 2 tablets by mouth daily    cyclobenzaprine (FLEXERIL) 10 MG tablet TAKE 1 TABLET BY MOUTH THREE TIMES A DAY AS NEEDED FOR MUSCLE SPASMS    beclomethasone (QVAR REDIHALER) 80 MCG/ACT AERB inhaler Inhale 2 puffs into the lungs in the morning and 2 puffs in the evening.    pregabalin (LYRICA) 150 MG capsule Take 1 capsule by mouth 2 times daily for 180 days. Max Daily Amount: 300 mg    DULoxetine (CYMBALTA) 30 MG extended release capsule Take 1 capsule by mouth 2 times daily    Insulin Pen Needle 32G X 4 MM MISC 1 each by Does not apply route daily    BIOTIN PO Take 1 tablet by mouth daily    ZINC PO Take by mouth    albuterol sulfate HFA (PROVENTIL;VENTOLIN;PROAIR) 108 (90 Base) MCG/ACT inhaler Inhale 1 puff into the lungs every 4 hours as needed    albuterol (PROVENTIL) (2.5 MG/3ML) 0.083% nebulizer solution Inhale 3 mLs into the lungs every 4 hours as needed    vitamin D3 (CHOLECALCIFEROL) 125 MCG (5000 UT) TABS tablet Take 1 tablet by mouth daily    diphenhydrAMINE (BENADRYL) 25 MG capsule Take 1 capsule by mouth every 6 hours as needed    Specialty Vitamins Products (MAGNESIUM, AMINO ACID CHELATE,) 133 MG tablet Take 1 tablet by mouth 2 times daily    triamcinolone (KENALOG) 0.1 % cream APPLY TO AFFECTED AREA TWICE A DAY AS NEEDED FOR RASH    zolpidem (AMBIEN) 5 MG tablet Take 1 tablet by mouth.     No current facility-administered medications for this visit.     Allergies:  Allergies   Allergen Reactions    Penicillins Hives and Itching       Blood Glucose Monitoring (BGM) or CGM:  - Had access to home glucometer/blood glucose log/CGM reader today:  {YES/NO:19726}  -  Conducts/scans: ***   - Fasting BG today: ***  - Post-prandial:   -FBG: ***   -ac lunch: ***    -ac dinner: ***   -hs: ***    ROS:  Today, Pt endorses:  - {Symptoms of  Hyperglycemia:17903:::1}  - {Symptoms of Hypoglycemia:17902:::1}    Lifestyle modification(s):  -     Medication Adherence/Access:  - Endorses adherence to current regimen?: {YES/NO:19726}    Vitals/Labs:  No results found for: "HBA1C"  Hemoglobin A1C   Date Value Ref Range Status   03/20/2023 9.2 (H) 4.8 - 5.6 % Final     Comment:                 Prediabetes: 5.7 - 6.4           Diabetes: >6.4           Glycemic control for adults with diabetes: <7.0     06/25/2022 9.8 (H) 4.8 - 5.6 % Final     Comment:                 Prediabetes: 5.7 - 6.4           Diabetes: >6.4           Glycemic control for adults with diabetes: <7.0     05/08/2022 9.1 (H) 4.2 - 5.6 % Final     Comment:     (NOTE)  HbA1C Interpretive Ranges  <5.7              Normal  5.7 - 6.4         Consider Prediabetes  >6.5              Consider Diabetes         Screenings/Prevention Parameters:  -Diabetic Eye and Foot Exams:      Diabetes Management   Topic Date Due    Diabetic retinal exam  10/20/2021    Diabetic foot exam  02/21/2023    A1C test (Diabetic or Prediabetic)  06/20/2023     -Microalbumin / Creatinine ratio:     No results found for: "MICROALBUR", "LABCREA"   No results found for: "MALBCR"   No components found for: "MALBCREARAT"  -Immunizations:      Immunization History   Administered Date(s) Administered    COVID-19, MODERNA BLUE border, Primary or Immunocompromised, (age 12y+), IM, 100 mcg/0.43mL 06/16/2019, 07/14/2019    COVID-19, MODERNA Bivalent, (age 12y+), IM, 50 mcg/0.5 mL 09/13/2021    COVID-19, MODERNA, (age 12y+), IM, 58mcg/0.5mL 02/18/2022, 07/25/2022    COVID-19, PFIZER Bivalent, DO NOT Dilute, (age 12y+), IM, 30 mcg/0.3 mL 01/29/2021    COVID-19, PFIZER PURPLE top, DILUTE for use, (age 30 y+), 52mcg/0.3mL 03/12/2020, 10/22/2020    COVID-19, PFIZER, (age 12y+),  IM, 45mcg/0.3mL 02/15/2023    Influenza Trivalent 02/27/2018, 02/23/2019, 03/01/2020    Influenza Virus Vaccine 02/18/2022    Influenza, FLUARIX, FLULAVAL, FLUZONE (age 51 mo+) and AFLURIA, (age 5 y+), Quadv PF, 0.37mL 01/23/2021, 02/15/2023    Pneumococcal, PCV20, PREVNAR 20, (age 6w+), IM, 0.31mL 01/23/2021    Pneumococcal, PPSV23, PNEUMOVAX 23, (age 2y+), SC/IM, 0.3mL 03/18/2019    TDaP, ADACEL (age 60y-64y), BOOSTRIX (age 10y+), IM, 0.49mL 03/18/2019    Zoster Recombinant (Shingrix) 06/11/2021, 09/13/2021       Additional Laboratory Parameters of Interest:   Estimation of renal function:  Lab Results   Component Value Date/Time    LABGLOM 74 03/20/2023 12:00 AM    LABGLOM 66 06/25/2022 12:00 AM    LABGLOM >60 05/08/2022 01:28 PM    LABGLOM >60 01/21/2022 03:21 PM    LABGLOM 57 05/29/2021 02:59 PM    GFRAA >60 01/12/2021 11:49 AM    GFRAA >60 11/22/2020 11:12 AM    GFRAA >60  08/10/2020 03:38 PM     Wt Readings from Last 3 Encounters:   03/20/23 95.7 kg (211 lb)   06/26/22 94.3 kg (208 lb)   02/20/22 93.4 kg (206 lb)     Ht Readings from Last 1 Encounters:   03/20/23 1.727 m (5\' 8" )     Calculated estimated creatinine clearance: CrCl cannot be calculated (Unknown ideal weight.).    Vital Signs Today:    There were no vitals taken for this visit.    There are no discontinued medications.    No orders of the defined types were placed in this encounter.      Future Appointments   Date Time Provider Department Center   06/24/2023  9:45 AM [facility]OC LAB VISIT [facility] DEP   07/02/2023 11:30 AM Sarris, Vinetta Bergamo, MD [facility] E[facility] DEP       Patient verbalized understanding of the information presented and all of the patient's questions were answered.  AVS was handed to the patient. Patient advised to call the office with any additional questions or concerns.    Notifications of recommendations will be sent to [place] Pink, MD for review.      Thank you for the consult,  Estill Cotta, PharmD, BCACP, BC-ADM        {[facility] [employer]

## 2023-06-10 MED ORDER — MECLIZINE HCL 25 MG PO TABS
25 | ORAL_TABLET | ORAL | 1 refills | Status: DC
Start: 2023-06-10 — End: 2024-01-07

## 2023-06-10 MED ORDER — CYCLOBENZAPRINE HCL 10 MG PO TABS
10 | ORAL_TABLET | ORAL | 1 refills | 10.00000 days | Status: DC
Start: 2023-06-10 — End: 2023-12-12

## 2023-06-10 MED ORDER — KLOR-CON M20 20 MEQ PO TBCR
20 | ORAL_TABLET | Freq: Every day | ORAL | 3 refills | Status: DC
Start: 2023-06-10 — End: 2023-12-12

## 2023-06-11 ENCOUNTER — Encounter

## 2023-06-13 ENCOUNTER — Encounter

## 2023-06-13 MED ORDER — FREESTYLE LIBRE 3 SENSOR MISC
3 refills | 30.00000 days | Status: DC
Start: 2023-06-13 — End: 2024-04-05

## 2023-06-13 NOTE — Telephone Encounter (Signed)
PCP: [place] Pink, MD    LAST OFFICE VISIT: 03/20/2023    LAST REFILL PER CHART:  Medication:Continuous Blood Gluc Sensor (FREESTYLE LIBRE 3 SENSOR)   Ordered On:08/28/2022  Instructions:Use to monitor blood glucose continuously - change every 14 days   Dispense:6 each  Refills:3      Future Appointments   Date Time Provider Department Center   06/23/2023  3:00 PM Jacqualine Mau [facility] DEP   06/24/2023  9:45 AM [facility]OC LAB VISIT [facility] DEP   07/02/2023 11:30 AM [place] Pink, MD [facility] DEP   07/11/2023  2:00 PM [facility] RM 3 3D [facility] [place]

## 2023-06-23 ENCOUNTER — Encounter: Primary: Internal Medicine

## 2023-06-23 NOTE — Progress Notes (Deleted)
 Pharmacy Progress Note - Diabetes Management       Assessment / Plan:   Diabetes Management:  Per ADA guidelines, [name] A1c {IS/IS NOT:19932} at goal of < 7%.  ***    Hyperlipidemia:  The 10-year ASCVD risk score (Arnett DK, et al., 2019) is: 15.4% based on parameters listed. Current lipid treatment guidelines recommend at least moderate-intensity statin doses for all patients with diabetes to decrease overall ASCVD risk. Patient currently qualifies for a moderate intensity statin therapy based on current recommendations and is currently taking a moderate intensity statin.      Nutrition/Lifestyle Modifications:  - Educated pt on the importance of moderating carbohydrate intake. Reviewed sources of carbohydrates and method to help determine appropriate portion sizes (e.g., Diabetes Plate Method).  - Advised patient to avoid sugar-sweetened beverages and replace with water or diet/zero sugar option.  - Recommend ~30 minutes consistent, moderately intensive, exercise/day or ~150 minutes/week. Start small, stay consistent, and increase length and types of exercise, as tolerated.       Patient will return to clinic in *** week(s) for follow up.        S/O: [name], a 63 y.o. 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  Pt was seen today for diabetes management.  Patient's last A1c was:   Hemoglobin A1C   Date Value Ref Range Status   03/20/2023 9.2 (H) 4.8 - 5.6 % Final     Comment:                 Prediabetes: 5.7 - 6.4           Diabetes: >6.4           Glycemic control for adults with diabetes: <7.0         Interim update: Pt was last seen by me on 08/28/2022.  Per my prior  note: [name] A1c is not at goal of < 7%.  [name] basal control is very poor at this time.  She is also having some large post-prandial elevations during her large, single meal of the day.  Encouraged to eat smaller, more frequent meals to take advantage of Mounjaro plus the SSI from Humalog.  Will increase her Tresiba to 70 units qhs and have her titrate by 2 units every 2 days until her FBG < 140 mg/dL.  Will maintain her current Mounjaro dose for now and will focus on basal control.  Will switch pt over to the [place] d/t her difficulty remembering to scan her Truitt Merle an adequate amount of times to provide 24hr data.  Will reassess with CGM data in 3 weeks.    Pt was lost to follow up.    Today:   ***    Current anti-hyperglycemic regimen includes:    Key Antihyperglycemic Medications               Insulin Degludec (TRESIBA FLEXTOUCH) 200 UNIT/ML SOPN INJECT 70 UNITS INTO THE SKIN EVERY EVENING ADJUST AS DIRECTED.    Tirzepatide (MOUNJARO) 5 MG/0.5ML SOAJ Inject 5 mg into the skin every 7 days    insulin lispro, 1 Unit Dial, (HUMALOG/ADMELOG) 100 UNIT/ML SOPN CHECK BLOOD GLUCOSE PRIOR TO MEALS AND AT BEDTIME. TAKE ACCORDING TO SLIDING SCALE FOR BS  180-200 USE 2 UNITS, 201-220 USE 4 UNITS, 221-240 USE 5 UNITS, 241-280 USE 6 UNITS, 281-320 USE 7 UNITS, 321-400 USE 8 UNITS, GREATER THAN 400 CALL DOCTOR          Complete current medication regimen includes:  Current Outpatient Medications   Medication Sig    Continuous Glucose Sensor (FREESTYLE LIBRE 3 SENSOR) MISC USE TO MONITOR BLOOD GLUCOSE CONTINUOUSLY - CHANGE EVERY 14 DAYS    cyclobenzaprine (FLEXERIL) 10 MG tablet TAKE 1 TABLET BY MOUTH THREE TIMES A DAY AS NEEDED FOR MUSCLE SPASMS    meclizine (ANTIVERT) 25 MG tablet TAKE ONE TABLET BY MOUTH THREE TIMES A DAY AS NEEDED FOR DIZZINESS    KLOR-CON M20 20 MEQ extended release tablet TAKE TWO TABLETS BY MOUTH EVERY DAY    atorvastatin (LIPITOR) 20 MG tablet TAKE 1 TABLET BY MOUTH EVERY DAY    Insulin Degludec (TRESIBA  FLEXTOUCH) 200 UNIT/ML SOPN INJECT 70 UNITS INTO THE SKIN EVERY EVENING ADJUST AS DIRECTED.    magnesium oxide (MAG-OX) 400 MG tablet Take 2 tablets by mouth 2 times daily    Tirzepatide (MOUNJARO) 5 MG/0.5ML SOAJ Inject 5 mg into the skin every 7 days    insulin lispro, 1 Unit Dial, (HUMALOG/ADMELOG) 100 UNIT/ML SOPN CHECK BLOOD GLUCOSE PRIOR TO MEALS AND AT BEDTIME. TAKE ACCORDING TO SLIDING SCALE FOR BS 180-200 USE 2 UNITS, 201-220 USE 4 UNITS, 221-240 USE 5 UNITS, 241-280 USE 6 UNITS, 281-320 USE 7 UNITS, 321-400 USE 8 UNITS, GREATER THAN 400 CALL DOCTOR    lisinopril (PRINIVIL;ZESTRIL) 10 MG tablet Take 1 tablet by mouth daily    montelukast (SINGULAIR) 10 MG tablet TAKE 1 TABLET BY MOUTH DAILY. INDICATIONS: CONTROLLER MEDICATION FOR ASTHMA    omeprazole (PRILOSEC OTC) 20 MG tablet Take 1 tablet by mouth daily    beclomethasone (QVAR REDIHALER) 80 MCG/ACT AERB inhaler Inhale 2 puffs into the lungs in the morning and 2 puffs in the evening.    pregabalin (LYRICA) 150 MG capsule Take 1 capsule by mouth 2 times daily for 180 days. Max Daily Amount: 300 mg    DULoxetine (CYMBALTA) 30 MG extended release capsule Take 1 capsule by mouth 2 times daily    Insulin Pen Needle 32G X 4 MM MISC 1 each by Does not apply route daily    BIOTIN PO Take 1 tablet by mouth daily    ZINC PO Take by mouth    albuterol sulfate HFA (PROVENTIL;VENTOLIN;PROAIR) 108 (90 Base) MCG/ACT inhaler Inhale 1 puff into the lungs every 4 hours as needed    albuterol (PROVENTIL) (2.5 MG/3ML) 0.083% nebulizer solution Inhale 3 mLs into the lungs every 4 hours as needed    vitamin D3 (CHOLECALCIFEROL) 125 MCG (5000 UT) TABS tablet Take 1 tablet by mouth daily    diphenhydrAMINE (BENADRYL) 25 MG capsule Take 1 capsule by mouth every 6 hours as needed    Specialty Vitamins Products (MAGNESIUM, AMINO ACID CHELATE,) 133 MG tablet Take 1 tablet by mouth 2 times daily    triamcinolone (KENALOG) 0.1 % cream APPLY TO AFFECTED AREA TWICE A DAY AS NEEDED FOR  RASH    zolpidem (AMBIEN) 5 MG tablet Take 1 tablet by mouth.     No current facility-administered medications for this visit.     Allergies:  Allergies   Allergen Reactions    Penicillins Hives and Itching       Blood Glucose Monitoring (BGM) or CGM:  - Had access to home glucometer/blood glucose log/CGM reader today:  {  YES/NO:19726}  - Conducts/scans: ***   - Fasting BG today: ***  - Post-prandial:   -FBG: ***   -ac lunch: ***    -ac dinner: ***   -hs: ***    ROS:  Today, Pt endorses:  - {Symptoms of Hyperglycemia:17903:::1}  - {Symptoms of Hypoglycemia:17902:::1}    Lifestyle modification(s):  -     Medication Adherence/Access:  - Endorses adherence to current regimen?: {YES/NO:19726}    Vitals/Labs:  No results found for: "HBA1C"  Hemoglobin A1C   Date Value Ref Range Status   03/20/2023 9.2 (H) 4.8 - 5.6 % Final     Comment:                 Prediabetes: 5.7 - 6.4           Diabetes: >6.4           Glycemic control for adults with diabetes: <7.0     06/25/2022 9.8 (H) 4.8 - 5.6 % Final     Comment:                 Prediabetes: 5.7 - 6.4           Diabetes: >6.4           Glycemic control for adults with diabetes: <7.0     05/08/2022 9.1 (H) 4.2 - 5.6 % Final     Comment:     (NOTE)  HbA1C Interpretive Ranges  <5.7              Normal  5.7 - 6.4         Consider Prediabetes  >6.5              Consider Diabetes         Screenings/Prevention Parameters:  -Diabetic Eye and Foot Exams:      Diabetes Management   Topic Date Due    Diabetic retinal exam  10/20/2021    Diabetic foot exam  02/21/2023    A1C test (Diabetic or Prediabetic)  06/20/2023     -Microalbumin / Creatinine ratio:     No results found for: "MICROALBUR", "LABCREA"   No results found for: "MALBCR"   No components found for: "MALBCREARAT"  -Immunizations:      Immunization History   Administered Date(s) Administered    COVID-19, MODERNA BLUE border, Primary or Immunocompromised, (age 12y+), IM, 100 mcg/0.60mL 06/16/2019, 07/14/2019    COVID-19, MODERNA  Bivalent, (age 12y+), IM, 50 mcg/0.5 mL 09/13/2021    COVID-19, MODERNA, (age 12y+), IM, 90mcg/0.5mL 02/18/2022, 07/25/2022    COVID-19, PFIZER Bivalent, DO NOT Dilute, (age 12y+), IM, 30 mcg/0.3 mL 01/29/2021    COVID-19, PFIZER PURPLE top, DILUTE for use, (age 71 y+), 86mcg/0.3mL 03/12/2020, 10/22/2020    COVID-19, PFIZER, (age 12y+), IM, 55mcg/0.3mL 02/15/2023    Influenza Trivalent 02/27/2018, 02/23/2019, 03/01/2020    Influenza Virus Vaccine 02/18/2022    Influenza, FLUARIX, FLULAVAL, FLUZONE (age 16 mo+) and AFLURIA, (age 168 y+), Quadv PF, 0.61mL 01/23/2021, 02/15/2023    Pneumococcal, PCV20, PREVNAR 20, (age 16w+), IM, 0.69mL 01/23/2021    Pneumococcal, PPSV23, PNEUMOVAX 23, (age 2y+), SC/IM, 0.82mL 03/18/2019    TDaP, ADACEL (age 54y-64y), BOOSTRIX (age 10y+), IM, 0.73mL 03/18/2019    Zoster Recombinant (Shingrix) 06/11/2021, 09/13/2021       Additional Laboratory Parameters of Interest:   Estimation of renal function:  Lab Results   Component Value Date/Time    LABGLOM 74 03/20/2023 12:00 AM    LABGLOM 66 06/25/2022 12:00  AM    LABGLOM >60 05/08/2022 01:28 PM    LABGLOM >60 01/21/2022 03:21 PM    LABGLOM 57 05/29/2021 02:59 PM    GFRAA >60 01/12/2021 11:49 AM    GFRAA >60 11/22/2020 11:12 AM    GFRAA >60 08/10/2020 03:38 PM     Wt Readings from Last 3 Encounters:   03/20/23 95.7 kg (211 lb)   06/26/22 94.3 kg (208 lb)   02/20/22 93.4 kg (206 lb)     Ht Readings from Last 1 Encounters:   03/20/23 1.727 m (5\' 8" )     Calculated estimated creatinine clearance: CrCl cannot be calculated (Unknown ideal weight.).    Vital Signs Today:    There were no vitals taken for this visit.    There are no discontinued medications.    No orders of the defined types were placed in this encounter.      Future Appointments   Date Time Provider Department Center   06/23/2023  3:00 PM Jacqualine Mau [facility] ECC DEP   06/24/2023  9:45 AM IOC LAB VISIT [facility] DEP   07/02/2023 11:30 AM [place] Pink, MD [facility] ECC  DEP   07/11/2023  2:00 PM [facility] RM 3 3D [facility] [place]       Patient verbalized understanding of the information presented and all of the patient's questions were answered.  AVS was handed to the patient. Patient advised to call the office with any additional questions or concerns.    Notifications of recommendations will be sent to [place] Pink, MD for review.      Thank you for the consult,  Estill Cotta, PharmD, BCACP, BC-ADM        {[facility] [employer]

## 2023-06-24 ENCOUNTER — Encounter

## 2023-06-24 ENCOUNTER — Encounter: Payer: MEDICARE | Primary: Internal Medicine

## 2023-07-02 ENCOUNTER — Encounter: Admit: 2023-07-01 | Payer: MEDICARE | Attending: Internal Medicine | Primary: Internal Medicine

## 2023-07-07 NOTE — Telephone Encounter (Signed)
 lmtrc

## 2023-07-07 NOTE — Telephone Encounter (Addendum)
 Please call and schedule appointment with Dr. Felipe Drone early March, there are available slots on 08/01/2023

## 2023-07-09 NOTE — Telephone Encounter (Signed)
 Left second message.

## 2023-07-09 NOTE — Telephone Encounter (Signed)
 Attempted to contact patient. No answer. Lvm to return call to office to schedule.   Third attempt, encounter to close.

## 2023-07-11 ENCOUNTER — Ambulatory Visit: Payer: MEDICARE | Primary: Internal Medicine

## 2023-07-14 MED ORDER — DULOXETINE HCL 30 MG PO CPEP
30 | ORAL_CAPSULE | Freq: Two times a day (BID) | ORAL | 3 refills | Status: AC
Start: 2023-07-14 — End: ?

## 2023-07-14 NOTE — Telephone Encounter (Signed)
 Refill request via fax     Medication: DULoxetine (CYMBALTA) 30 MG extended release capsule   Quantity: 180  Pharmacy: [facility] [place], [place] - [address]   Date written: 02/20/2022    PCP: [place] Pink, MD    LAST OFFICE VISIT: 03/20/2023      Future Appointments   Date Time Provider Department Center   07/23/2023 11:30 AM [facility] LAB VISIT [facility] DEP   08/11/2023  2:00 PM Jacqualine Mau [facility] ECC DEP   09/23/2023  2:00 PM [place] Pink, MD [facility] ECC DEP

## 2023-07-14 NOTE — Telephone Encounter (Signed)
 This patient is scheduled for an office visit in May but her lab appointment is on March 12.  Please see if she is available on March 21 since she was one of the patients that needed to be rescheduled due to the snow.

## 2023-07-14 NOTE — Telephone Encounter (Signed)
 Lvmtrc

## 2023-07-22 MED ORDER — LISINOPRIL 10 MG PO TABS
10 MG | ORAL_TABLET | Freq: Every day | ORAL | 3 refills | Status: DC
Start: 2023-07-22 — End: 2023-08-18

## 2023-07-22 NOTE — Telephone Encounter (Signed)
 PCP: Mattydale Pink, MD    LAST OFFICE VISIT: 03/20/2023    LAST REFILL PER CHART:  Medication:lisinopril (PRINIVIL;ZESTRIL) 10 MG tablet   Ordered On:09/26/2022  Instructions:Take 1 tablet by mouth daily   Dispense:90 tablets  Refills:3      Future Appointments   Date Time Provider Department Center   07/23/2023 11:30 AM [facility]OC LAB VISIT [facility] DEP   08/01/2023 11:00 AM Faywood Pink, MD [facility] DE[facility]   08/11/2023  2:00 PM Jacqualine Mau, RPH [facility] DEP

## 2023-07-23 ENCOUNTER — Encounter: Payer: MEDICARE | Primary: Internal Medicine

## 2023-07-28 ENCOUNTER — Encounter: Payer: Medicare (Managed Care) | Primary: Internal Medicine

## 2023-07-31 ENCOUNTER — Encounter: Admit: 2023-07-31 | Discharge: 2023-07-31 | Payer: MEDICARE | Primary: Internal Medicine

## 2023-08-01 ENCOUNTER — Telehealth: Admit: 2023-08-01 | Discharge: 2023-08-01 | Payer: MEDICARE | Attending: Internal Medicine | Primary: Internal Medicine

## 2023-08-01 DIAGNOSIS — E1140 Type 2 diabetes mellitus with diabetic neuropathy, unspecified: Secondary | ICD-10-CM

## 2023-08-01 LAB — COMPREHENSIVE METABOLIC PANEL
ALT: 23 IU/L (ref 0–32)
AST: 19 IU/L (ref 0–40)
Albumin: 4.1 g/dL (ref 3.9–4.9)
Alkaline Phosphatase: 121 IU/L (ref 44–121)
BUN/Creatinine Ratio: 11 — ABNORMAL LOW (ref 12–28)
BUN: 12 mg/dL (ref 8–27)
CO2: 25 mmol/L (ref 20–29)
Calcium: 10.9 mg/dL — ABNORMAL HIGH (ref 8.7–10.3)
Chloride: 99 mmol/L (ref 96–106)
Creatinine: 1.07 mg/dL — ABNORMAL HIGH (ref 0.57–1.00)
Est, Glom Filt Rate: 59 mL/min/{1.73_m2} — ABNORMAL LOW (ref >59)
Globulin, Total: 2.5 g/dL (ref 1.5–4.5)
Glucose: 169 mg/dL — ABNORMAL HIGH (ref 70–99)
Potassium: 4.5 mmol/L (ref 3.5–5.2)
Sodium: 140 mmol/L (ref 134–144)
Total Bilirubin: 1 mg/dL (ref 0.0–1.2)
Total Protein: 6.6 g/dL (ref 6.0–8.5)

## 2023-08-01 LAB — ALBUMIN/CREATININE RATIO, URINE
Albumin Urine: 12.3 ug/mL
Albumin/Creatinine Ratio: 8 mg/g{creat} (ref 0–29)
Creatinine, Ur: 154.3 mg/dL

## 2023-08-01 LAB — LIPID PANEL
Cholesterol, Total: 151 mg/dL (ref 100–199)
HDL: 42 mg/dL (ref >39)
LDL Cholesterol: 81 mg/dL (ref 0–99)
Triglycerides: 161 mg/dL — ABNORMAL HIGH (ref 0–149)
VLDL Cholesterol Calculated: 28 mg/dL (ref 5–40)

## 2023-08-01 LAB — CBC WITH AUTO DIFFERENTIAL
Basophils %: 1 %
Basophils Absolute: 0.1 10*3/uL (ref 0.0–0.2)
Eosinophils %: 1 %
Eosinophils Absolute: 0.1 10*3/uL (ref 0.0–0.4)
Hematocrit: 43.6 % (ref 34.0–46.6)
Hemoglobin: 14.7 g/dL (ref 11.1–15.9)
Immature Grans (Abs): 0 10*3/uL (ref 0.0–0.1)
Immature Granulocytes %: 0 %
Lymphocytes %: 34 %
Lymphocytes Absolute: 3.1 10*3/uL (ref 0.7–3.1)
MCH: 31.3 pg (ref 26.6–33.0)
MCHC: 33.7 g/dL (ref 31.5–35.7)
MCV: 93 fL (ref 79–97)
Monocytes %: 5 %
Monocytes Absolute: 0.4 10*3/uL (ref 0.1–0.9)
Neutrophils %: 59 %
Neutrophils Absolute: 5.5 10*3/uL (ref 1.4–7.0)
Platelets: 230 10*3/uL (ref 150–450)
RBC: 4.7 x10E6/uL (ref 3.77–5.28)
RDW: 13 % (ref 11.7–15.4)
WBC: 9.1 10*3/uL (ref 3.4–10.8)

## 2023-08-01 LAB — HEMOGLOBIN A1C: Hemoglobin A1C: 9.2 % — ABNORMAL HIGH (ref 4.8–5.6)

## 2023-08-01 LAB — VITAMIN D 25 HYDROXY: Vit D, 25-Hydroxy: 42.5 ng/mL (ref 30.0–100.0)

## 2023-08-01 LAB — MAGNESIUM: Magnesium: 2.1 mg/dL (ref 1.6–2.3)

## 2023-08-01 MED ORDER — PREGABALIN 200 MG PO CAPS
200 | ORAL_CAPSULE | Freq: Every evening | ORAL | 1 refills | 30.00000 days | Status: DC
Start: 2023-08-01 — End: 2023-12-12

## 2023-08-01 MED ORDER — TIRZEPATIDE 10 MG/0.5ML SC SOAJ
10 MG/0.5ML | SUBCUTANEOUS | 3 refills | Status: DC
Start: 2023-08-01 — End: 2023-09-08

## 2023-08-01 MED ORDER — TIRZEPATIDE 7.5 MG/0.5ML SC SOAJ
7.5 MG/0.5ML | SUBCUTANEOUS | 0 refills | Status: DC
Start: 2023-08-01 — End: 2023-09-08

## 2023-08-01 NOTE — Progress Notes (Signed)
 [name] Wu presents today for   Chief Complaint   Patient presents with    Follow-up       "Have you been to the ER, urgent care clinic since your last visit?  Hospitalized since your last visit?"    NO    "Have you seen or consulted any other health care providers outside of [facility] since your last visit?"    NO       Have you had a mammogram?"   NO    Date of last Mammogram: 10/27/2020

## 2023-08-01 NOTE — Patient Instructions (Signed)
 Heart-Healthy Diet: Care Instructions  Your Care Instructions     A heart-healthy diet has lots of vegetables, fruits, nuts, beans, and whole grains, and is low in salt. It limits foods that are high in saturated fat, such as meats, cheeses, and fried foods. It may be hard to change your diet, but even small changes can lower your risk of heart attack and heart disease.  Follow-up care is a key part of your treatment and safety. Be sure to make and go to all appointments, and call your doctor if you are having problems. It's also a good idea to know your test results and keep a list of the medicines you take.  How can you care for yourself at home?  Watch your portions  Learn what a serving is. A "serving" and a "portion" are not always the same thing. Make sure that you are not eating larger portions than are recommended. For example, a serving of pasta is  cup. A serving size of meat is 2 to 3 ounces. A 3-ounce serving is about the size of a deck of cards. Measure serving sizes until you are good at "eyeballing" them. Keep in mind that restaurants often serve portions that are 2 or 3 times the size of one serving.  To keep your energy level up and keep you from feeling hungry, eat often but in smaller portions.  Eat only the number of calories you need to stay at a healthy weight. If you need to lose weight, eat fewer calories than your body burns (through exercise and other physical activity).  Eat more fruits and vegetables  Eat a variety of fruit and vegetables every day. Dark green, deep orange, red, or yellow fruits and vegetables are especially good for you. Examples include spinach, carrots, peaches, and berries.  Keep carrots, celery, and other veggies handy for snacks. Buy fruit that is in season and store it where you can see it so that you will be tempted to eat it.  Cook dishes that have a lot of veggies in them, such as stir-fries and soups.  Limit saturated and trans fat  Read food labels, and try  to avoid saturated and trans fats. They increase your risk of heart disease.  Use olive or canola oil when you cook.  Bake, broil, grill, or steam foods instead of frying them.  Choose lean meats instead of high-fat meats such as hot dogs and sausages. Cut off all visible fat when you prepare meat.  Eat fish, skinless poultry, and meat alternatives such as soy products instead of high-fat meats. Soy products, such as tofu, may be especially good for your heart.  Choose low-fat or fat-free milk and dairy products.  Eat foods high in fiber  Eat a variety of grain products every day. Include whole-grain foods that have lots of fiber and nutrients. Examples of whole-grain foods include oats, whole wheat bread, and brown rice.  Buy whole-grain breads and cereals, instead of white bread or pastries.  Limit salt and sodium  Limit how much salt and sodium you eat to help lower your blood pressure.  Taste food before you salt it. Add only a little salt when you think you need it. With time, your taste buds will adjust to less salt.  Eat fewer snack items, fast foods, and other high-salt, processed foods. Check food labels for the amount of sodium in packaged foods.  Choose low-sodium versions of canned goods (such as soups, vegetables, and beans).  Limit  sugar  Limit drinks and foods with added sugar. These include candy, desserts, and soda pop.  Limit alcohol  Limit alcohol to no more than 2 drinks a day for men and 1 drink a day for women. Too much alcohol can cause health problems.  When should you call for help?  Watch closely for changes in your health, and be sure to contact your doctor if:    You would like help planning heart-healthy meals.   Where can you learn more?  Go to [email or web address]  Enter V137 in the search box to learn more about "Heart-Healthy Diet: Care Instructions."  Current as of: January 01, 2018               Content Version: 12.6   2006-2020 [employer].    Care instructions adapted under license by [employer] (which disclaims liability or warranty for this information). If you have questions about a medical condition or this instruction, always ask your healthcare professional. [employer] disclaims any warranty or liability for your use of this information.    A Healthy Lifestyle: Care Instructions  A healthy lifestyle can help you feel good, have more energy, and stay at a weight that's healthy for you. You can share a healthy lifestyle with your friends and family. And you can do it on your own.    Eat meals with your friends or family. You could try cooking together.    Plan activities with other people. Go for a walk with a friend, try a free online fitness class, or join a sports league.    Eat a variety of healthy foods. These include fruits, vegetables, whole grains, low-fat dairy, and lean protein.    Choose healthy portions of food. You can use the Nutrition Facts label on food packages as a guide.    Eat more fruits and vegetables. You could add vegetables to sandwiches or add fruit to cereal.    Drink water when you are thirsty. Limit soda, juice, and sports drinks.    Try to exercise most days. Aim for at least 2 hours of exercise each week.    Keep moving. Work in the garden or take your dog on a walk. Use the stairs instead of the elevator.    If you use tobacco or nicotine, try to quit. Ask your doctor about programs and medicines to help you quit.    Limit alcohol. Men should have no more than 2 drinks a day. Women should have no more than 1. For some people, no alcohol is the best choice.  Follow-up care is a key part of your treatment and safety. Be sure to make and go to all appointments, and call your doctor if you are having problems. It's also a good idea to know your test results and keep a list of the medicines you take.  Where can you learn more?  Go to [email or web address] and enter U807 to learn  more about "A Healthy Lifestyle: Care Instructions."  Current as of: December 16, 2021               Content Version: 14.0   2006-2024 [employer].   Care instructions adapted under license by [facility]. If you have questions about a medical condition or this instruction, always ask your healthcare professional. [employer] disclaims any warranty or liability for your use of this information.      Learning About Diabetes Food Guidelines  Your Care Instructions  Meal planning is important to manage diabetes. It helps keep your blood sugar at a target level (which you set with your doctor). You don't have to eat special foods. You can eat what your family eats, including sweets once in a while. But you do have to pay attention to how often you eat and how much you eat of certain foods.  You may want to work with a dietitian or a certified diabetes educator (CDE) to help you plan meals and snacks. A dietitian or CDE can also help you lose weight if that is one of your goals.  What should you know about eating carbs?  Managing the amount of carbohydrate (carbs) you eat is an important part of healthy meals when you have diabetes. Carbohydrate is found in many foods.  Learn which foods have carbs. And learn the amounts of carbs in different foods.  Bread, cereal, pasta, and rice have about 15 grams of carbs in a serving. A serving is 1 slice of bread (1 ounce),  cup of cooked cereal, or 1/3 cup of cooked pasta or rice.  Fruits have 15 grams of carbs in a serving. A serving is 1 small fresh fruit, such as an apple or orange;  of a banana;  cup of cooked or canned fruit;  cup of fruit juice; 1 cup of melon or raspberries; or 2 tablespoons of dried fruit.  Milk and no-sugar-added yogurt have 15 grams of carbs in a serving. A serving is 1 cup of milk or 2/3 cup of no-sugar-added yogurt.  Starchy vegetables have 15 grams of carbs in a serving. A serving is  cup of mashed potatoes or sweet  potato; 1 cup winter squash;  of a small baked potato;  cup of cooked beans; or  cup cooked corn or green peas.  Learn how much carbs to eat each day and at each meal. A dietitian or CDE can teach you how to keep track of the amount of carbs you eat. This is called carbohydrate counting.  If you are not sure how to count carbohydrate grams, use the Plate Method to plan meals. It is a good, quick way to make sure that you have a balanced meal. It also helps you spread carbs throughout the day.  Divide your plate by types of foods. Put non-starchy vegetables on half the plate, meat or other protein food on one-quarter of the plate, and a grain or starchy vegetable in the final quarter of the plate. To this you can add a small piece of fruit and 1 cup of milk or yogurt, depending on how many carbs you are supposed to eat at a meal.  Try to eat about the same amount of carbs at each meal. Do not "save up" your daily allowance of carbs to eat at one meal.  Proteins have very little or no carbs per serving. Examples of proteins are beef, chicken, [place], fish, eggs, tofu, cheese, cottage cheese, and peanut butter. A serving size of meat is 3 ounces, which is about the size of a deck of cards. Examples of meat substitute serving sizes (equal to 1 ounce of meat) are 1/4 cup of cottage cheese, 1 egg, 1 tablespoon of peanut butter, and  cup of tofu.  How can you eat out and still eat healthy?  Learn to estimate the serving sizes of foods that have carbohydrate. If you measure food at home, it will be easier to estimate the amount in a serving of restaurant food.  If the meal you order has too much carbohydrate (such as potatoes, corn, or baked beans), ask to have a low-carbohydrate food instead. Ask for a salad or green vegetables.  If you use insulin, check your blood sugar before and after eating out to help you plan how much to eat in the future.  If you eat more carbohydrate at a meal than you had planned, take a walk  or do other exercise. This will help lower your blood sugar.  What else should you know?  Limit saturated fat, such as the fat from meat and dairy products. This is a healthy choice because people who have diabetes are at higher risk of heart disease. So choose lean cuts of meat and nonfat or low-fat dairy products. Use olive or canola oil instead of butter or shortening when cooking.  Don't skip meals. Your blood sugar may drop too low if you skip meals and take insulin or certain medicines for diabetes.  Check with your doctor before you drink alcohol. Alcohol can cause your blood sugar to drop too low. Alcohol can also cause a bad reaction if you take certain diabetes medicines.  Follow-up care is a key part of your treatment and safety. Be sure to make and go to all appointments, and call your doctor if you are having problems. It's also a good idea to know your test results and keep a list of the medicines you take.  Where can you learn more?  Go to [email or web address]  Enter I147 in the search box to learn more about "Learning About Diabetes Food Guidelines."  Current as of: May 01, 2018               Content Version: 12.6   2006-2020 [employer].   Care instructions adapted under license by [employer] (which disclaims liability or warranty for this information). If you have questions about a medical condition or this instruction, always ask your healthcare professional. [employer] disclaims any warranty or liability for your use of this information.        Learning About Meal Planning for Diabetes  Why plan your meals?     Meal planning can be a key part of managing diabetes. Planning meals and snacks with the right balance of carbohydrate, protein, and fat can help you keep your blood sugar at the target level you set with your doctor.  You don't have to eat special foods. You can eat what your family eats, including sweets once in a  while. But you do have to pay attention to how often you eat and how much you eat of certain foods.  You may want to work with a dietitian or a certified diabetes educator. He or she can give you tips and meal ideas and can answer your questions about meal planning. This health professional can also help you reach a healthy weight if that is one of your goals.  What plan is right for you?  Your dietitian or diabetes educator may suggest that you start with the plate format or carbohydrate counting.  The plate format  The plate format is a simple way to help you manage how you eat. You plan meals by learning how much space each food should take on a plate. Using the plate format helps you spread carbohydrate throughout the day. It can make it easier to keep your blood sugar level within your target range. It also helps you see if you're eating healthy portion sizes.  To use the plate format, you put non-starchy vegetables on half your plate. Add meat or meat substitutes on one-quarter of the plate. Put a grain or starchy vegetable (such as brown rice or a potato) on the final quarter of the plate. You can add a small piece of fruit and some low-fat or fat-free milk or yogurt, depending on your carbohydrate goal for each meal.  Here are some tips for using the plate format:  Make sure that you are not using an oversized plate. A 9-inch plate is best. Many restaurants use larger plates.  Get used to using the plate format at home. Then you can use it when you eat out.  Write down your questions about using the plate format. Talk to your doctor, a dietitian, or a diabetes educator about your concerns.  Carbohydrate counting  With carbohydrate counting, you plan meals based on the amount of carbohydrate in each food. Carbohydrate raises blood sugar higher and more quickly than any other nutrient. It is found in desserts, breads and cereals, and fruit. It's also found in starchy vegetables such as potatoes and corn,  grains such as rice and pasta, and milk and yogurt. Spreading carbohydrate throughout the day helps keep your blood sugar levels within your target range.  Your daily amount depends on several things, including your weight, how active you are, which diabetes medicines you take, and what your goals are for your blood sugar levels. A registered dietitian or diabetes educator can help you plan how much carbohydrate to include in each meal and snack.  A guideline for your daily amount of carbohydrate is:  45 to 60 grams at each meal. That's about the same as 3 to 4 carbohydrate servings.  15 to 20 grams at each snack. That's about the same as 1 carbohydrate serving.  The Nutrition Facts label on packaged foods tells you how much carbohydrate is in a serving of the food. First, look at the serving size on the food label. Is that the amount you eat in a serving? All of the nutrition information on a food label is based on that serving size. So if you eat more or less than that, you'll need to adjust the other numbers. Total carbohydrate is the next thing you need to look for on the label. If you count carbohydrate servings, one serving of carbohydrate is 15 grams.  For foods that don't come with labels, such as fresh fruits and vegetables, you'll need a guide that lists carbohydrate in these foods. Ask your doctor, dietitian, or diabetes educator about books or other nutrition guides you can use.  If you take insulin, you need to know how many grams of carbohydrate are in a meal. This lets you know how much rapid-acting insulin to take before you eat. If you use an insulin pump, you get a constant rate of insulin during the day. So the pump must be programmed at meals to give you extra insulin to cover the rise in blood sugar after meals.  When you know how much carbohydrate you will eat, you can take the right amount of insulin. Or, if you always use the same amount of insulin, you need to make sure that you eat the same  amount of carbohydrate at meals.  If you need more help to understand carbohydrate counting and food labels, ask your doctor, dietitian, or diabetes educator.  How do you get started with meal planning?  Here are some tips to get started:  Plan  your meals a week at a time. Don't forget to include snacks too.  Use cookbooks or online recipes to plan several main meals. Plan some quick meals for busy nights. You also can double some recipes that freeze well. Then you can save half for other busy nights when you don't have time to cook.  Make sure you have the ingredients you need for your recipes. If you're running low on basic items, put these items on your shopping list too.  List foods that you use to make breakfasts, lunches, and snacks. List plenty of fruits and vegetables.  Post this list on the refrigerator. Add to it as you think of more things you need.  Take the list to the store to do your weekly shopping.  Follow-up care is a key part of your treatment and safety. Be sure to make and go to all appointments, and call your doctor if you are having problems. It's also a good idea to know your test results and keep a list of the medicines you take.  Where can you learn more?  Go to [email or web address]  Enter [phone] in the search box to learn more about "Learning About Meal Planning for Diabetes."  Current as of: May 01, 2018               Content Version: 12.6   2006-2020 [employer].   Care instructions adapted under license by [employer] (which disclaims liability or warranty for this information). If you have questions about a medical condition or this instruction, always ask your healthcare professional. [employer] disclaims any warranty or liability for your use of this information.        High Cholesterol: Care Instructions  Your Care Instructions     Cholesterol is a type of fat in your blood. It is needed for many body functions, such  as making new cells. Cholesterol is made by your body. It also comes from food you eat. High cholesterol means that you have too much of the fat in your blood. This raises your risk of a heart attack and stroke.  LDL and HDL are part of your total cholesterol. LDL is the "bad" cholesterol. High LDL can raise your risk for heart disease, heart attack, and stroke. HDL is the "good" cholesterol. It helps clear bad cholesterol from the body. High HDL is linked with a lower risk of heart disease, heart attack, and stroke.  Your cholesterol levels help your doctor find out your risk for having a heart attack or stroke. You and your doctor can talk about whether you need to lower your risk and what treatment is best for you.  A heart-healthy lifestyle along with medicines can help lower your cholesterol and your risk. The way you choose to lower your risk will depend on how high your risk is for heart attack and stroke. It will also depend on how you feel about taking medicines.  Follow-up care is a key part of your treatment and safety. Be sure to make and go to all appointments, and call your doctor if you are having problems. It's also a good idea to know your test results and keep a list of the medicines you take.  How can you care for yourself at home?  Eat a variety of foods every day. Good choices include fruits, vegetables, whole grains (like oatmeal), dried beans and peas, nuts and seeds, soy products (like tofu), and fat-free or low-fat dairy products.  Replace butter, margarine,  and hydrogenated or partially hydrogenated oils with olive and canola oils. (Canola oil margarine without trans fat is fine.)  Replace red meat with fish, poultry, and soy protein (like tofu).  Limit processed and packaged foods like chips, crackers, and cookies.  Bake, broil, or steam foods. Don't fry them.  Be physically active. Get at least 30 minutes of exercise on most days of the week. Walking is a good choice. You also may want to  do other activities, such as running, swimming, cycling, or playing tennis or team sports.  Stay at a healthy weight or lose weight by making the changes in eating and physical activity listed above. Losing just a small amount of weight, even 5 to 10 pounds, can reduce your risk for having a heart attack or stroke.  Do not smoke.  When should you call for help?  Watch closely for changes in your health, and be sure to contact your doctor if:    You need help making lifestyle changes.     You have questions about your medicine.   Where can you learn more?  Go to [email or web address]  Enter [phone] in the search box to learn more about "High Cholesterol: Care Instructions."  Current as of: April 27, 2018               Content Version: 12.6   2006-2020 [employer].   Care instructions adapted under license by [employer] (which disclaims liability or warranty for this information). If you have questions about a medical condition or this instruction, always ask your healthcare professional. [employer] disclaims any warranty or liability for your use of this information.

## 2023-08-01 NOTE — Telephone Encounter (Signed)
 Patient seen for virtual visit today.  Please schedule her for a 22-month follow-up with labs prior.

## 2023-08-01 NOTE — Telephone Encounter (Signed)
 Patient contacted and scheduled.

## 2023-08-01 NOTE — Progress Notes (Signed)
 08/01/2023    TELEHEALTH EVALUATION -- Audio/Visual    HPI:  [name] Wu (DOB:  [birth year]) has requested an audio/video evaluation for the following concern(s):  Routine follow-up visit.    HPI:   [name] Wu is a 63 y.o. year old female who presents today for a routine follow-up visit.  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  She reports that she is doing reasonably well.  She is accompanied on the virtual visit by her sister [name] Wu to help with the history.    At her last visit, she reported that she no longer would be to follow with the [facility] in [place] since they were out of network with her new insurance.  She was referred to establish care with Dr. 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  She was seen for an initial visit on 06/10/2023 and lab evaluation was performed. Recommendation was to consider a bone marrow biopsy with MRD pending lab results.  It was also recommended that she undergo evaluation by pulmonary for PFT testing given her stem cell transplant, and she was referred to Dr. Boone Master at [facility].  She reports that she is scheduled for an appointment on 08/22/2023.  It was also recommended that she stay up-to-date on her health maintenance screening testing, and she was urged to obtain a mammogram and Pap smear.  She no-showed for her mammogram appointment on 05/05/2023 and 4 her well woman exam with NP Earlene Plater on 07/29/2023.  She states that she has rescheduled her gynecology appointment for 09/2023 but has not yet scheduled her mammogram.  She was also advised to obtain the RSV vaccine.  She is scheduled for a follow-up visit with Dr. Brand Males on 09/16/2023.    She reports that she is continuing to be compliant with her medications and states that  she is on tirzepatide 5 mg weekly and Tresiba 70 units every evening.  She states that she also will use Humalog as needed.  She reports that she is monitoring her blood sugar with the Freestyle libre 3 CGM.  She established care with Pharm.D. Dail on 07/08/2022 and had one follow-up visit with him on 08/28/2022. However, she was lost to follow-up due to her insurance issues but is now scheduled for a follow-up visit to reestablish care on 08/11/2023.    She states today that she feels her peripheral neuropathy symptoms have worsened despite treatment with Cymbalta 30 mg twice daily and pregabalin 150 mg at bedtime.  She discusses that she feels that she may have restless leg symptoms and does acknowledge that she snores at night.  She states that Dr. Brand Males also recommended that she undergo a sleep study to evaluate for obstructive sleep apnea.  She is planning to address this at her initial pulmonary appointment.    She discusses that her asthma control has significantly improved on Qvar, Singulair and albuterol as needed.  She denies any recent exacerbation.    She is otherwise without new complaints.        Summary of prior hospitalizations and medical history:   She has a history of acute lymphoblastic leukemia, diagnosed in 02/2017 after experiencing severe fatigue and bone pain for several months. She was positive for the [place] chromosome mutation. She presented to the [facility] in [place] for care, and  underwent chemotherapy which achieved remission. She subsequently underwent a stem cell transplant on 08/14/2017 which was felt to be curative. She had been on maintenance therapy with a tyrosine kinase inhibitor, nilotimib 200 mg bid. She had been followed by Dr. Skeet Latch , transplant oncologist, and had been traveling to [facility] every three months for care. She had a follow-up visit with Dr. Karn Cassis in 12/2020 and treatment with nilotinib was discontinued and follow-up recommended  on an annual basis.      He has a history of hypertension, treated with amlodipine. She has a history of hyperlipidemia, and was restarted on atorvastatin in 07/2019.  She also has a history of diabetes mellitus, currently being treated with Tresiba, Humalog, and Ozempic. She denies any polyuria, polydipsia, nocturia, or blurry vision, and has no history of retinopathy or nephropathy. 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 She was under the care of pain management and being treated with Cymbalta and Roxicodone as needed, but has noted a significant decrease in the need for narcotics since being started on pregabalin in 02/2020.     She has a history of asthma since childhood, and is being maintained on Flovent and albuterol as needed. She denies any cough or shortness of breath.      In 04/2018, she was admitted to [facility] from 12/4-12/10/2017 for a UTI and then subsequently readmitted from 12/6-12/17/2019 with MRSA bacteremia related to a PICC line and bibasilar pneumonia.      He has a history of GERD, treated with Protonix. She also underwent a screening colonoscopy on 05/14/2018 by Dr. Paulita Cradle at the [facility] in [place] to evaluate diarrhea post stem cell transplant. No lesions were found. She denies any abdominal pain, nausea, vomiting, melena, hematochezia, or change in bowel movements.       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 Max Daily Amount: 200 mg    Tirzepatide 7.5 MG/0.5ML SOAJ Inject 7.5 mg into the skin every 7 days for 4 doses    Tirzepatide 10 MG/0.5ML SOAJ Inject 10 mg into the skin every 7 days Began after completing 7.5 mg for 4 weeks.    lisinopril (PRINIVIL;ZESTRIL) 10 MG tablet Take 1 tablet by mouth daily    DULoxetine (CYMBALTA) 30 MG extended release capsule Take 1 capsule by mouth 2 times daily    Continuous Glucose Sensor (FREESTYLE LIBRE 3 SENSOR) MISC USE TO MONITOR BLOOD GLUCOSE CONTINUOUSLY - CHANGE EVERY 14 DAYS    cyclobenzaprine (FLEXERIL) 10 MG tablet TAKE 1  TABLET BY MOUTH THREE TIMES A DAY AS NEEDED FOR MUSCLE SPASMS    meclizine (ANTIVERT) 25 MG tablet TAKE ONE TABLET BY MOUTH THREE TIMES A DAY AS NEEDED FOR DIZZINESS    KLOR-CON M20 20 MEQ extended release tablet TAKE TWO TABLETS BY MOUTH EVERY DAY    atorvastatin (LIPITOR) 20 MG tablet TAKE 1 TABLET BY MOUTH EVERY DAY    Insulin Degludec (TRESIBA FLEXTOUCH) 200 UNIT/ML SOPN INJECT 70 UNITS INTO THE SKIN EVERY EVENING ADJUST AS DIRECTED.    magnesium oxide (MAG-OX) 400 MG tablet Take 2 tablets by mouth 2 times daily    Tirzepatide (MOUNJARO) 5 MG/0.5ML SOAJ Inject 5 mg into the skin every 7 days    insulin lispro, 1 Unit Dial, (HUMALOG/ADMELOG) 100 UNIT/ML SOPN CHECK BLOOD GLUCOSE PRIOR TO MEALS AND AT BEDTIME. TAKE ACCORDING TO SLIDING SCALE FOR BS 180-200 USE 2 UNITS, 201-220 USE 4 UNITS, 221-240 USE 5 UNITS, 241-280 USE 6 UNITS, 281-320 USE 7 UNITS, 321-400 USE 8 UNITS, GREATER THAN 400 CALL DOCTOR    montelukast (SINGULAIR) 10 MG tablet TAKE 1 TABLET BY MOUTH DAILY. INDICATIONS: CONTROLLER MEDICATION FOR ASTHMA    omeprazole (PRILOSEC OTC) 20 MG tablet Take 1  tablet by mouth daily    beclomethasone (QVAR REDIHALER) 80 MCG/ACT AERB inhaler Inhale 2 puffs into the lungs in the morning and 2 puffs in the evening.    Insulin Pen Needle 32G X 4 MM MISC 1 each by Does not apply route daily    BIOTIN PO Take 1 tablet by mouth daily    ZINC PO Take by mouth    albuterol sulfate HFA (PROVENTIL;VENTOLIN;PROAIR) 108 (90 Base) MCG/ACT inhaler Inhale 1 puff into the lungs every 4 hours as needed    albuterol (PROVENTIL) (2.5 MG/3ML) 0.083% nebulizer solution Inhale 3 mLs into the lungs every 4 hours as needed    vitamin D3 (CHOLECALCIFEROL) 125 MCG (5000 UT) TABS tablet Take 1 tablet by mouth daily    diphenhydrAMINE (BENADRYL) 25 MG capsule Take 1 capsule by mouth every 6 hours as needed    Specialty Vitamins Products (MAGNESIUM, AMINO ACID CHELATE,) 133 MG tablet Take 1 tablet by mouth 2 times daily    triamcinolone (KENALOG) 0.1 % cream APPLY TO AFFECTED AREA TWICE A DAY AS NEEDED FOR RASH    zolpidem (AMBIEN) 5 MG tablet Take 1 tablet by mouth.     No current facility-administered medications for this visit.     Allergies and Intolerances:   Allergies   Allergen Reactions    Penicillins Hives and Itching     Family History:  She has no family history of colon cancer of leukemia. Mother had breast cancer at the age of 50.   Family History   Problem Relation Age of Onset    Breast Cancer Mother      Social History:   She  reports that she has never smoked. She has never used smokeless tobacco. She is divorced and has no children. She is employed as a Research scientist (medical).  Social History     Substance and Sexual Activity   Alcohol Use No     Immunization History:  Immunization History   Administered Date(s) Administered    COVID-19, MODERNA BLUE border, Primary or Immunocompromised, (age 12y+), IM, 100 mcg/0.43mL 06/16/2019, 07/14/2019    COVID-19, MODERNA Bivalent, (age 12y+), IM, 50 mcg/0.5 mL 09/13/2021    COVID-19, MODERNA, 2024/25, (age 12y+), IM, 42mcg/0.5mL 02/18/2022, 07/25/2022     COVID-19, PFIZER Bivalent, DO NOT  Dilute, (age 12y+), IM, 30 mcg/0.3 mL 01/29/2021    COVID-19, PFIZER PURPLE top, DILUTE for use, (age 38 y+), 62mcg/0.3mL 03/12/2020, 10/22/2020    COVID-19, PFIZER, 2024/25, (age 12y+), IM, 13mcg/0.3mL 02/15/2023    Influenza Trivalent 02/27/2018, 02/23/2019, 03/01/2020    Influenza Virus Vaccine 02/18/2022    Influenza, FLUARIX, FLULAVAL, FLUZONE (age 64 mo+) and AFLURIA, (age 2 y+), Quadv PF, 0.61mL 01/23/2021, 02/15/2023    Pneumococcal, PCV20, PREVNAR 20, (age 64w+), IM, 0.33mL 01/23/2021    Pneumococcal, PPSV23, PNEUMOVAX 23, (age 2y+), SC/IM, 0.57mL 03/18/2019    TDaP, ADACEL (age 85y-64y), Leda Min (age 10y+), IM, 0.68mL 03/18/2019    Zoster Recombinant (Shingrix) 06/11/2021, 09/13/2021       Review of Systems:   As above included in HPI.  Otherwise 11 point review of systems negative including constitutional, skin, HENT, eyes, respiratory, cardiovascular, gastrointestinal, genitourinary, musculoskeletal, endocrine, hematologic, allergy, and neurologic.    Physical:   There were no vitals filed for this visit.      Exam:   PHYSICAL EXAMINATION:    Constitutional: [x]  Appears well-developed and well-nourished [x]  No apparent distress      []  Abnormal-   Mental status  [x]  Alert and awake  [x]  Oriented to person/place/time [x] Able to follow commands      Eyes:  EOM    [x]   Normal  []  Abnormal-  Sclera  [x]   Normal  []  Abnormal -         Discharge []   None visible  []  Abnormal -    HENT:   [x]  Normocephalic, atraumatic.  []  Abnormal   []  Mouth/Throat: Mucous membranes are moist.     External Ears [x]  Normal  []  Abnormal-     Neck: [x]  No visualized mass     Pulmonary/Chest: [x]  Respiratory effort normal.  [x]  No visualized signs of difficulty breathing or respiratory distress        []  Abnormal-      Musculoskeletal:   []  Normal gait with no signs of ataxia         [x]  Normal range of motion of neck        []  Abnormal-       Neurological:        [x]  No Facial Asymmetry (Cranial nerve  7 motor function) (limited exam to video visit)          [x]  No gaze palsy        []  Abnormal-         Skin:        [x]  No significant exanthematous lesions or discoloration noted on facial skin         []  Abnormal-            Psychiatric:       [x]  Normal Affect [x]  No Hallucinations        []  Abnormal-     Other pertinent observable physical exam findings-     Review of Data:  Labs:  No visits with results within 2 Day(s) from this visit.   Latest known visit with results is:   Orders Only on 06/24/2023   Component Date Value Ref Range Status    WBC 07/31/2023 9.1  3.4 - 10.8 x10E3/uL Final    RBC 07/31/2023 4.70  3.77 - 5.28 x10E6/uL Final    Hemoglobin 07/31/2023 14.7  11.1 - 15.9 g/dL Final    Hematocrit 16/02/9603 43.6  34.0 - 46.6 % Final    MCV 07/31/2023 93  79 - 97  fL Final    MCH 07/31/2023 31.3  26.6 - 33.0 pg Final    MCHC 07/31/2023 33.7  31.5 - 35.7 g/dL Final    RDW 16/02/9603 13.0  11.7 - 15.4 % Final    Platelets 07/31/2023 230  150 - 450 x10E3/uL Final    Neutrophils % 07/31/2023 59  Not Estab. % Final    Lymphocytes % 07/31/2023 34  Not Estab. % Final    Monocytes % 07/31/2023 5  Not Estab. % Final    Eosinophils % 07/31/2023 1  Not Estab. % Final    Basophils % 07/31/2023 1  Not Estab. % Final    Neutrophils Absolute 07/31/2023 5.5  1.4 - 7.0 x10E3/uL Final    Lymphocytes Absolute 07/31/2023 3.1  0.7 - 3.1 x10E3/uL Final    Monocytes Absolute 07/31/2023 0.4  0.1 - 0.9 x10E3/uL Final    Eosinophils Absolute 07/31/2023 0.1  0.0 - 0.4 x10E3/uL Final    Basophils Absolute 07/31/2023 0.1  0.0 - 0.2 x10E3/uL Final    Immature Granulocytes % 07/31/2023 0  Not Estab. % Final    Immature Grans (Abs) 07/31/2023 0.0  0.0 - 0.1 x10E3/uL Final    Glucose 07/31/2023 169 (H)  70 - 99 mg/dL Final    BUN 54/01/8118 12  8 - 27 mg/dL Final    Creatinine 14/78/2956 1.07 (H)  0.57 - 1.00 mg/dL Final    Est, Glom Filt Rate 07/31/2023 59 (L)  >59 mL/min/1.73 Final    BUN/Creatinine Ratio 07/31/2023 11 (L)  12 - 28  Final    Sodium 07/31/2023 140  134 - 144 mmol/L Final    Potassium 07/31/2023 4.5  3.5 - 5.2 mmol/L Final    Chloride 07/31/2023 99  96 - 106 mmol/L Final    CO2 07/31/2023 25  20 - 29 mmol/L Final    Calcium 07/31/2023 10.9 (H)  8.7 - 10.3 mg/dL Final    Total Protein 07/31/2023 6.6  6.0 - 8.5 g/dL Final    Albumin 21/30/8657 4.1  3.9 - 4.9 g/dL Final    Globulin, Total 07/31/2023 2.5  1.5 - 4.5 g/dL Final    Total Bilirubin 07/31/2023 1.0  0.0 - 1.2 mg/dL Final    Alkaline Phosphatase 07/31/2023 121  44 - 121 IU/L Final    AST 07/31/2023 19  0 - 40 IU/L Final    ALT 07/31/2023 23  0 - 32 IU/L Final    Cholesterol, Total 07/31/2023 151  100 - 199 mg/dL Final    Triglycerides 07/31/2023 161 (H)  0 - 149 mg/dL Final    HDL 84/69/6295 42  >39 mg/dL Final    VLDL Cholesterol Calculated 07/31/2023 28  5 - 40 mg/dL Final    LDL Cholesterol 07/31/2023 81  0 - 99 mg/dL Final    Hemoglobin M8U 07/31/2023 9.2 (H)  4.8 - 5.6 % Final    Magnesium 07/31/2023 2.1  1.6 - 2.3 mg/dL Final    Creatinine, Ur 07/31/2023 154.3  Not Estab. mg/dL Final    Albumin Urine 07/31/2023 12.3  Not Estab. ug/mL Final    Albumin/Creatinine Ratio 07/31/2023 8  0 - 29 mg/g creat Final    Vit D, 25-Hydroxy 07/31/2023 42.5  30.0 - 100.0 ng/mL Final            Health Maintenance:  Screening:               Mammogram: Right mammo/ultrasound negative (10/2020); Bilateral due in 01/2021  OVERDUE  PAP smear: well woman exam with NP Daphane Shepherd (Pap/HPV negative on 02/15/2020)              Colorectal: colonoscopy (05/14/2018) normal.  Dr. Paulita Cradle in [place].  Due 05/2028.              Depression: none              DM (HbA1c/FPG): FPG 205/HbA1c 9.2 (03/2023)              Hepatitis C: negative (03/2019)              Falls: none              DEXA: ordered              Glaucoma: regular eye exams with Dr. Octavio Graves (last 10/2020).  Scheduled in 09/2023.              Smoking: none              Vitamin D: 42.5 (07/2023)              Medicare Wellness: 02/20/2022   DUE       Impression:  Patient Active Problem List   Diagnosis    Peripheral neuropathy due to chemotherapy    ALL (acute lymphoid leukemia) in remission [facility])    Essential hypertension    Type 2 diabetes mellitus, with long-term current use of insulin (HCC)    S/P bone marrow transplant (HCC)    Eczema    Elevated alkaline phosphatase level    Primary osteoarthritis involving multiple joints    Insomnia    GERD (gastroesophageal reflux disease)    Vestibular neuronitis    Hyperlipidemia    Class 1 obesity due to excess calories with serious comorbidity in adult    Imbalance    Mild intermittent asthma       Plan:  1. Diabetes mellitus. Previously well controlled on regimen of Tresiba and SS Humalog with HbA1c 6.5 in 03/2019. However, progressively worsened due to weight gain and difficulty obtaining insulin at times.  Now prescribed Freestyle 3 CGM to help with close monitoring of blood sugars.  Established care with Pharm.D. Dail on 07/08/2022 and had one follow-up appointment on 08/28/2022, but again lost to follow-up due to insurance issues.  Reports titrated Evaristo Bury on her own and currently taking 70 units daily and Mounjaro 5 mg weekly with HbA1c stable at 9.2 today.  Scheduled to reestablish care with Pharm.D. Dail on 08/11/2023 to review CGM data to help with adjustment in therapy.  Advised to titrate dose of tirzepatide to 7.5 mg for 4 weeks and subsequently to 10 mg weekly.  No evidence of microvascular complications. On statin and on lisinopril since 04/2020. Continue regular eye exams with Dr. Octavio Graves.  Overdue and reports scheduled.  Foot exam grossly normal (02/2022), and no evidence of albuminuria (8 mg/g). Emphasized importance of lifestyle modifications, including low carbohydrate diet, regular exercise, and weight loss.  Will reassess HbA1c at next visit.  2. Acute lymphoblastic leukemia, +[place] chromosone mutation, s/p stem cell transplant, in remission. Diagnosed in 02/2017 and underwent  induction chemotherapy followed by stem cell transplant in 08/2017.  Had been on maintenance therapy with a TKI (nilotinib), but discontinued in 12/2020 by her oncologist.  Had been followed closely by Dr. Skeet Latch, transplant oncologist, at [facility] in [place].  Reported frequency of follow-up had been decreased to annually but unable to keep appointment in [place]  in 12/2022 due to insurance change.  Referred to Dr. Brand Males on 06/10/2023 and extensive lab evaluation performed.  Being considered for bone marrow biopsy with MRD pending lab results. Scheduled for follow-up with Dr. Brand Males to review results on 09/16/2023.  3. Hypertension. Blood pressure reportedly well controlled on lisinopril 10 mg daily. Renal function mildly decreased today at 1.07/eGFR 59.  On potassium and magnesium supplements due to long term difficulty with deficiency and levels remain stable today. Will continue to monitor.  4. Hyperlipidemia.  On moderate intensity dose atorvastatin with LDL 81 and HDL 42, indicative of good control.  Goal LDL <70 given diabetes mellitus.  If not improved at next visit, will consider increasing dose of atorvastatin.  Emphasized importance of lifestyle modifications, including heart healthy diet, regular exercise, and weight loss. Continue to follow.  5. Peripheral neuropathy, painful. Secondary to chemotherapy used to treat ALL. Did not tolerate gabapentin due to hallucinations. On Cymbalta 30 mg bid and started on pregabalin in 02/2021 with good effect.  Previously on pregabalin 150 mg twice daily but reports only taking nightly due to difficulty with sedation effects while at work.  UDS/CSA up-to-date. Instructed to increase dose of pregabalin to 200 mg nightly.  Advised to continue using Flexeril as needed for cramping.  Will reassess at next visit.  6. Asthma, mild intermittent.  Noting significant improvement since initiation of Singulair 10 mg daily following exacerbation in  03/2021.  Now on Qvar for maintenance therapy and albuterol as needed with good control. Referred by Dr. Brand Males to establish care with Dr. Boone Master for PFT testing given her stem cell transplant.  Appointment scheduled on 08/22/2023.  Also recommending sleep study evaluation due to ongoing fatigue, body habitus, and worsening restless leg symptoms.  Will readdress at next visit.  7. GERD. On Protonix 40 mg bid with good control. Follow.  8. Abnormal mammogram. Patient reports had previous biopsy of right breast which was benign. Screening mammogram (02/2019) showed multiple abnormalities in right breast. Right breast diagnostic mammogram and ultrasound (05/12/2019) showed probably benign findings, but recommendation was to proceed with 6 month follow-up. Underwent repeat diagnostic right mammogram and right breast ultrasound in 10/2020 and findings felt to be benign. Recommended continuing with annual follow-up and due for bilateral screening in 02/2021.  Order again placed in 06/2022 for bilateral mammogram and right breast ultrasound and urged to schedule.  Patient with FH history of breast cancer in her mother.  9. Elevated alkaline phosphatase. GGT elevated at 97 so likely of hepatic origin (04/2020). AMA negative (04/2020) and alkaline phosphatase isozymes with normal differential and predominant hepatic origin (04/2020).  Right upper quadrant ultrasound ordered but patient never obtained. Repeat alkaline phosphatase normalized to 107 in 01/2021 and remains normal today at 121. Suspect improvement related to discontinuation of nilotinib. Will continue to monitor.  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  Reported increasing difficulty with vertigo and stated that she had experienced multiple falls. Continuing difficulty with disequilibrium.  Referred to balance therapy in 01/2021 and again in 05/2021 but never scheduled due to cost. Advised use of meclizine as needed.  Reports overall improvement in symptoms today.  11. Insomnia. Using  Ambien +/- Benadryl as needed. Emphasized good sleep hygiene.  Continue to monitor.  12. Eczema. On Xyzal and will use Kenalog as needed.   13.  Chronic left trigger thumb. Evaluated by Dr. Andrey Campanile in 04/2019 and received a cortisone injection with improvement. Currently stable.  14.  Obesity. Weight increased nearly 20 pounds since 12/2019. On  tirzepatide but does not appear to be helping with appetite suppression.  Will titrate dose as discussed to 10 mg weekly.  Emphasized importance of continuing with attempts lifestyle modifications, including heart healthy low carbohydrate diet, regular exercise, and weight loss.  15. Health maintenance. Completed 4 doses of the COVID 19 vaccine series due to immunosuppressed status and the bivalent Pfizer booster dose.  Completed Prevnar 20 and Shingrix vaccine series.  Discussed recommended vaccines, including influenza vaccine, updated COVID vaccine, and new RSV vaccine. Other immunizations up-to-date.  Bilateral mammogram due in 02/2021 and still has not yet scheduled. Order placed again today with baseline bone density study.  Urged again today to schedule.  Colonoscopy completed in [place] on 05/14/2018.Marland Kitchen  Completed well woman exam with NP Elder Negus and chart updated, but due for follow-up with NP Earlene Plater now scheduled in 09/2023.  Also completed eye exam with Dr. Octavio Graves but now overdue for annual exam and discussed importance to schedule as overdue.  Vitamin D level remains normal on maintenance dose supplement. Emphasized importance of lifestyle modifications, including heart healthy diet, regular exercise, and weight loss. Medicare wellness visit due and will perform at next visit.      Patient understands recommendations and agrees with plan.  Follow-up in 3 months.      Littie Deeds, was evaluated through a synchronous (real-time) audio-video encounter. The patient (or guardian if applicable) is aware that this is a billable service, which includes applicable  co-pays. This Virtual Visit was conducted with patient's (and/or legal guardian's) consent. Patient identification was verified, and a caregiver was present when appropriate.   The patient was located at Home: [address]  [place] [address]  Provider was located at [facility] (Appt Dept): [address] [place], [address]  [place],  [place] [address]  Confirm you are appropriately licensed, registered, or certified to deliver care in the state where the patient is located as indicated above. If you are not or unsure, please re-schedule the visit: Yes, I confirm.       Total time spent on this encounter:  50 minutes    --Candelaria Pink, MD on 08/01/2023 at 2:06 PM    An electronic signature was used to authenticate this note.

## 2023-08-06 MED ORDER — MONTELUKAST SODIUM 10 MG PO TABS
10 | ORAL_TABLET | ORAL | 3 refills | Status: AC
Start: 2023-08-06 — End: ?

## 2023-08-11 ENCOUNTER — Encounter: Primary: Internal Medicine

## 2023-08-11 NOTE — Progress Notes (Unsigned)
 Pharmacy Progress Note - Diabetes Management       Assessment / Plan:   Diabetes Management:  Per ADA guidelines, [name] A1c {IS/IS NOT:19932} at goal of < 7%.  ***    Hyperlipidemia:  The 10-year ASCVD risk score (Arnett DK, et al., 2019) is: 17.2% based on parameters listed. Current lipid treatment guidelines recommend at least moderate-intensity statin doses for all patients with diabetes to decrease overall ASCVD risk. Patient currently qualifies for a *** intensity statin therapy based on current recommendations and is currently taking a *** intensity statin.      Nutrition/Lifestyle Modifications:  - Educated pt on the importance of moderating carbohydrate intake. Reviewed sources of carbohydrates and method to help determine appropriate portion sizes (e.g., Diabetes Plate Method).  - Advised patient to avoid sugar-sweetened beverages and replace with water or diet/zero sugar option.  - Recommend ~30 minutes consistent, moderately intensive, exercise/day or ~150 minutes/week. Start small, stay consistent, and increase length and types of exercise, as tolerated.       Patient will return to clinic in *** week(s) for follow up.        S/O: [name], a 63 y.o. 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  Pt was seen today for diabetes management.  Patient's last A1c was:   Hemoglobin A1C   Date Value Ref Range Status   07/31/2023 9.2 (H) 4.8 - 5.6 % Final     Comment:                 Prediabetes: 5.7 - 6.4           Diabetes: >6.4           Glycemic control for adults with diabetes: <7.0         Interim update: Pt was last seen by me on 08/28/2022.  Per my prior note: [name] A1c  is not at goal of < 7%.  [name] basal control is very poor at this time.  She is also having some large post-prandial elevations during her large, single meal of the day.  Encouraged to eat smaller, more frequent meals to take advantage of Mounjaro plus the SSI from Humalog.  Will increase her Tresiba to 70 units qhs and have her titrate by 2 units every 2 days until her FBG < 140 mg/dL.  Will maintain her current Mounjaro dose for now and will focus on basal control.  Will switch pt over to the [place] d/t her difficulty remembering to scan her Tru[place] Merle an adequate amount of times to provide 24hr data.  Will reassess with CGM data in 3 weeks.    Pt not seen by me since 08/28/2022.    Pt last seen by [place] Pink, MD on 08/01/2023.  Per her note: Previously well controlled on regimen of Tresiba and SS Humalog with HbA1c 6.5 in 03/2019. However, progressively worsened due to weight gain and difficulty obtaining insulin at times.  Now prescribed Freestyle 3 CGM to help with close monitoring of blood sugars.  Established care with Pharm.D. Emigdio Wildeman on 07/08/2022 and had one follow-up appointment on 08/28/2022, but again lost to follow-up due to insurance issues.  Reports titrated Evaristo Bury on her own and currently taking 70 units daily and  Mounjaro 5 mg weekly with HbA1c stable at 9.2 today.  Scheduled to reestablish care with Pharm.D. Preslea Rhodus on 08/11/2023 to review CGM data to help with adjustment in therapy.  Advised to titrate dose of tirzepatide to 7.5 mg for 4 weeks and subsequently to 10 mg weekly.  No evidence of microvascular complications. On statin and on lisinopril since 04/2020. Continue regular eye exams with Dr. Octavio Graves.  Overdue and reports scheduled.  Foot exam grossly normal (02/2022), and no evidence of albuminuria (8 mg/g). Emphasized importance of lifestyle modifications, including low carbohydrate diet, regular exercise, and weight loss.  Will reassess HbA1c at next visit.     Today:   ***    Current  anti-hyperglycemic regimen includes:    Key Antihyperglycemic Medications              Tirzepatide 7.5 MG/0.5ML SOAJ Inject 7.5 mg into the skin every 7 days for 4 doses    Tirzepatide 10 MG/0.5ML SOAJ Inject 10 mg into the skin every 7 days Began after completing 7.5 mg for 4 weeks.    Insulin Degludec (TRESIBA FLEXTOUCH) 200 UNIT/ML SOPN INJECT 70 UNITS INTO THE SKIN EVERY EVENING ADJUST AS DIRECTED.    Tirzepatide (MOUNJARO) 5 MG/0.5ML SOAJ Inject 5 mg into the skin every 7 days    insulin lispro, 1 Unit Dial, (HUMALOG/ADMELOG) 100 UNIT/ML SOPN CHECK BLOOD GLUCOSE PRIOR TO MEALS AND AT BEDTIME. TAKE ACCORDING TO SLIDING SCALE FOR BS 180-200 USE 2 UNITS, 201-220 USE 4 UNITS, 221-240 USE 5 UNITS, 241-280 USE 6 UNITS, 281-320 USE 7 UNITS, 321-400 USE 8 UNITS, GREATER THAN 400 CALL DOCTOR          Complete current medication regimen includes:  Current Outpatient Medications   Medication Sig    montelukast (SINGULAIR) 10 MG tablet TAKE ONE TABLET BY MOUTH EVERY DAY (CONTROLLER MEDICATION FOR ASTHMA)    pregabalin (LYRICA) 200 MG capsule Take 1 capsule by mouth nightly for 180 days. Max Daily Amount: 200 mg    Tirzepatide 7.5 MG/0.5ML SOAJ Inject 7.5 mg into the skin every 7 days for 4 doses    Tirzepatide 10 MG/0.5ML SOAJ Inject 10 mg into the skin every 7 days Began after completing 7.5 mg for 4 weeks.    lisinopril (PRINIVIL;ZESTRIL) 10 MG tablet Take 1 tablet by mouth daily    DULoxetine (CYMBALTA) 30 MG extended release capsule Take 1 capsule by mouth 2 times daily    Continuous Glucose Sensor (FREESTYLE LIBRE 3 SENSOR) MISC USE TO MONITOR BLOOD GLUCOSE CONTINUOUSLY - CHANGE EVERY 14 DAYS    cyclobenzaprine (FLEXERIL) 10 MG tablet TAKE 1 TABLET BY MOUTH THREE TIMES A DAY AS NEEDED FOR MUSCLE SPASMS    meclizine (ANTIVERT) 25 MG tablet TAKE ONE TABLET BY MOUTH THREE TIMES A DAY AS NEEDED FOR DIZZINESS    KLOR-CON M20 20 MEQ extended release tablet TAKE TWO TABLETS BY MOUTH EVERY DAY    atorvastatin (LIPITOR) 20 MG  tablet TAKE 1 TABLET BY MOUTH EVERY DAY    Insulin Degludec (TRESIBA FLEXTOUCH) 200 UNIT/ML SOPN INJECT 70 UNITS INTO THE SKIN EVERY EVENING ADJUST AS DIRECTED.    magnesium oxide (MAG-OX) 400 MG tablet Take 2 tablets by mouth 2 times daily    Tirzepatide (MOUNJARO) 5 MG/0.5ML SOAJ Inject 5 mg into the skin every 7 days    insulin lispro, 1 Unit Dial, (HUMALOG/ADMELOG) 100 UNIT/ML SOPN CHECK BLOOD GLUCOSE PRIOR TO MEALS AND AT BEDTIME. TAKE ACCORDING TO SLIDING SCALE FOR BS 180-200 USE 2  UNITS, 201-220 USE 4 UNITS, 221-240 USE 5 UNITS, 241-280 USE 6 UNITS, 281-320 USE 7 UNITS, 321-400 USE 8 UNITS, GREATER THAN 400 CALL DOCTOR    omeprazole (PRILOSEC OTC) 20 MG tablet Take 1 tablet by mouth daily    beclomethasone (QVAR REDIHALER) 80 MCG/ACT AERB inhaler Inhale 2 puffs into the lungs in the morning and 2 puffs in the evening.    Insulin Pen Needle 32G X 4 MM MISC 1 each by Does not apply route daily    BIOTIN PO Take 1 tablet by mouth daily    ZINC PO Take by mouth    albuterol sulfate HFA (PROVENTIL;VENTOLIN;PROAIR) 108 (90 Base) MCG/ACT inhaler Inhale 1 puff into the lungs every 4 hours as needed    albuterol (PROVENTIL) (2.5 MG/3ML) 0.083% nebulizer solution Inhale 3 mLs into the lungs every 4 hours as needed    vitamin D3 (CHOLECALCIFEROL) 125 MCG (5000 UT) TABS tablet Take 1 tablet by mouth daily    diphenhydrAMINE (BENADRYL) 25 MG capsule Take 1 capsule by mouth every 6 hours as needed    Specialty Vitamins Products (MAGNESIUM, AMINO ACID CHELATE,) 133 MG tablet Take 1 tablet by mouth 2 times daily    triamcinolone (KENALOG) 0.1 % cream APPLY TO AFFECTED AREA TWICE A DAY AS NEEDED FOR RASH    zolpidem (AMBIEN) 5 MG tablet Take 1 tablet by mouth.     No current facility-administered medications for this visit.     Allergies:  Allergies   Allergen Reactions    Penicillins Hives and Itching       Blood Glucose Monitoring (BGM) or CGM:  - Had access to home glucometer/blood glucose log/CGM reader today:   {YES/NO:19726}  - Conducts/scans: ***   - Fasting BG today: ***  - Post-prandial:   -FBG: ***   -ac lunch: ***    -ac dinner: ***   -hs: ***    ROS:  Today, Pt endorses:  - {Symptoms of Hyperglycemia:17903:::1}  - {Symptoms of Hypoglycemia:17902:::1}    Lifestyle modification(s):  -     Medication Adherence/Access:  - Endorses adherence to current regimen?: {YES/NO:19726}    Vitals/Labs:  No results found for: "HBA1C"  Hemoglobin A1C   Date Value Ref Range Status   07/31/2023 9.2 (H) 4.8 - 5.6 % Final     Comment:                 Prediabetes: 5.7 - 6.4           Diabetes: >6.4           Glycemic control for adults with diabetes: <7.0     03/20/2023 9.2 (H) 4.8 - 5.6 % Final     Comment:                 Prediabetes: 5.7 - 6.4           Diabetes: >6.4           Glycemic control for adults with diabetes: <7.0     06/25/2022 9.8 (H) 4.8 - 5.6 % Final     Comment:                 Prediabetes: 5.7 - 6.4           Diabetes: >6.4           Glycemic control for adults with diabetes: <7.0         Screenings/Prevention Parameters:  -Diabetic Eye and Foot Exams:  Diabetes Management   Topic Date Due    Diabetic retinal exam  10/20/2021    Diabetic foot exam  02/21/2023     -Microalbumin / Creatinine ratio:     No results found for: "MICROALBUR", "LABCREA"     Lab Results   Component Value Date    ALBCREAT 8 07/31/2023      No components found for: "MALBCREARAT"  -Immunizations:      Immunization History   Administered Date(s) Administered    COVID-19, MODERNA BLUE border, Primary or Immunocompromised, (age 12y+), IM, 100 mcg/0.15mL 06/16/2019, 07/14/2019    COVID-19, MODERNA Bivalent, (age 12y+), IM, 50 mcg/0.5 mL 09/13/2021    COVID-19, MODERNA, 2024/25, (age 12y+), IM, 40mcg/0.5mL 02/18/2022, 07/25/2022    COVID-19, PFIZER Bivalent, DO NOT Dilute, (age 12y+), IM, 30 mcg/0.3 mL 01/29/2021    COVID-19, PFIZER PURPLE top, DILUTE for use, (age 15 y+), 45mcg/0.3mL 03/12/2020, 10/22/2020    COVID-19, PFIZER, 2024/25, (age 12y+),  IM, 83mcg/0.3mL 02/15/2023    Influenza Trivalent 02/27/2018, 02/23/2019, 03/01/2020    Influenza Virus Vaccine 02/18/2022    Influenza, FLUARIX, FLULAVAL, FLUZONE (age 15 mo+) and AFLURIA, (age 62 y+), Quadv PF, 0.7mL 01/23/2021, 02/15/2023    Pneumococcal, PCV20, PREVNAR 20, (age 6w+), IM, 0.52mL 01/23/2021    Pneumococcal, PPSV23, PNEUMOVAX 23, (age 2y+), SC/IM, 0.59mL 03/18/2019    TDaP, ADACEL (age 30y-64y), BOOSTRIX (age 10y+), IM, 0.75mL 03/18/2019    Zoster Recombinant (Shingrix) 06/11/2021, 09/13/2021       Additional Laboratory Parameters of Interest:   Estimation of renal function:  Lab Results   Component Value Date/Time    LABGLOM 59 07/31/2023 12:00 AM    LABGLOM 74 03/20/2023 12:00 AM    LABGLOM 66 06/25/2022 12:00 AM    LABGLOM >60 05/08/2022 01:28 PM    LABGLOM >60 01/21/2022 03:21 PM    LABGLOM 57 05/29/2021 02:59 PM    GFRAA >60 01/12/2021 11:49 AM    GFRAA >60 11/22/2020 11:12 AM    GFRAA >60 08/10/2020 03:38 PM     Wt Readings from Last 3 Encounters:   03/20/23 95.7 kg (211 lb)   06/26/22 94.3 kg (208 lb)   02/20/22 93.4 kg (206 lb)     Ht Readings from Last 1 Encounters:   03/20/23 1.727 m (5\' 8" )     -ASCVD Risk Score Parameters and Calculation    Race: Black / African American     BP Readings from Last 3 Encounters:   03/20/23 134/80   06/26/22 118/78   02/20/22 121/79        Lab Results   Component Value Date    CHOL 151 07/31/2023    CHOL 130 03/20/2023    CHOL 157 06/25/2022     Lab Results   Component Value Date    TRIG 161 (H) 07/31/2023    TRIG 168 (H) 03/20/2023    TRIG 157 (H) 06/25/2022     Lab Results   Component Value Date    HDL 42 07/31/2023    HDL 40 03/20/2023    HDL 48 06/25/2022     No components found for: "LDLCHOLESTEROL", "LDLCALC"     Social History     Tobacco Use    Smoking status: Never    Smokeless tobacco: Never   Substance Use Topics    Alcohol use: No         Calculated ASCVD Risk Score: The 10-year ASCVD risk score (Arnett DK, et al., 2019) is: 17.2%    Values used  to  calculate the score:      Age: 70 years      Sex: Female      Is Non-Hispanic African American: Yes      Diabetic: Yes      Tobacco smoker: No      Systolic Blood Pressure: 134 mmHg      Is BP treated: Yes      HDL Cholesterol: 42 mg/dL      Total Cholesterol: 151 mg/dL    -Statin Safety Parameters:      Lab Results   Component Value Date    ALT 23 07/31/2023    AST 19 07/31/2023    GGT 97 (H) 05/04/2020    ALKPHOS 121 07/31/2023    BILITOT 1.0 07/31/2023       Calculated estimated creatinine clearance: CrCl cannot be calculated (Unknown ideal weight.).    Vital Signs Today:    There were no vitals taken for this visit.    There are no discontinued medications.    No orders of the defined types were placed in this encounter.      Future Appointments   Date Time Provider Department Center   08/11/2023  2:00 PM Jacqualine Mau [facility] ECC DEP   11/03/2023 11:30 AM [facility] LAB VISIT [facility] E[facility] DEP   11/11/2023  2:30 PM Sarris, Vinetta [place], MD [facility] ECC DEP       Patient verbalized understanding of the information presented and all of the patient's questions were answered.  AVS was handed to the patient. Patient advised to call the office with any additional questions or concerns.    Notifications of recommendations will be sent to [place] Pink, MD for review.      Thank you for the consult,  Estill Cotta, PharmD, BCACP, BC-ADM        {[facility] [employer]

## 2023-08-18 MED ORDER — LISINOPRIL 10 MG PO TABS
10 | ORAL_TABLET | Freq: Every day | ORAL | 3 refills | Status: AC
Start: 2023-08-18 — End: ?

## 2023-08-27 MED ORDER — ATORVASTATIN CALCIUM 20 MG PO TABS
20 | ORAL_TABLET | Freq: Every day | ORAL | 3 refills | Status: AC
Start: 2023-08-27 — End: ?

## 2023-08-31 MED ORDER — MONTELUKAST SODIUM 10 MG PO TABS
10 | ORAL_TABLET | ORAL | 3 refills | 90.00000 days | Status: AC
Start: 2023-08-31 — End: ?

## 2023-09-04 ENCOUNTER — Ambulatory Visit: Payer: MEDICARE | Primary: Internal Medicine

## 2023-09-08 ENCOUNTER — Encounter: Admit: 2023-09-08 | Discharge: 2023-09-08 | Primary: Internal Medicine

## 2023-09-08 ENCOUNTER — Encounter: Primary: Internal Medicine

## 2023-09-08 MED ORDER — INSULIN LISPRO (1 UNIT DIAL) 100 UNIT/ML SC SOPN
100 | Freq: Three times a day (TID) | SUBCUTANEOUS | 5 refills | Status: DC
Start: 2023-09-08 — End: 2024-01-07

## 2023-09-08 MED ORDER — MOUNJARO 7.5 MG/0.5ML SC SOAJ
7.5 | SUBCUTANEOUS | 0 refills | 28.00000 days | Status: DC
Start: 2023-09-08 — End: 2023-11-11

## 2023-09-08 MED ORDER — MOUNJARO 10 MG/0.5ML SC SOAJ
10 | SUBCUTANEOUS | 0 refills | 28.00000 days | Status: DC
Start: 2023-09-08 — End: 2023-11-11

## 2023-09-08 NOTE — Progress Notes (Unsigned)
 Pharmacy Progress Note - Diabetes Management       Assessment / Plan:   Diabetes Management:  Per ADA guidelines, [name] A1c {IS/IS NOT:19932} at goal of < 7%.  ***    Hyperlipidemia:  The 10-year ASCVD risk score (Arnett DK, et al., 2019) is: 17.8% based on parameters listed. Current lipid treatment guidelines recommend at least moderate-intensity statin doses for all patients with diabetes to decrease overall ASCVD risk. Patient currently qualifies for a *** intensity statin therapy based on current recommendations and is currently taking a *** intensity statin.      Nutrition/Lifestyle Modifications:  - Educated pt on the importance of moderating carbohydrate intake. Reviewed sources of carbohydrates and method to help determine appropriate portion sizes (e.g., Diabetes Plate Method).  - Advised patient to avoid sugar-sweetened beverages and replace with water or diet/zero sugar option.  - Recommend ~30 minutes consistent, moderately intensive, exercise/day or ~150 minutes/week. Start small, stay consistent, and increase length and types of exercise, as tolerated.       Patient will return to clinic in *** week(s) for follow up.        S/O: [name], a 63 y.o. 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  Pt was seen today for diabetes management.  Patient's last A1c was:   Hemoglobin A1C   Date Value Ref Range Status   07/31/2023 9.2 (H) 4.8 - 5.6 % Final     Comment:                 Prediabetes: 5.7 - 6.4           Diabetes: >6.4           Glycemic control for adults with diabetes: <7.0         Interim update: Pt was last seen by me on 08/28/2022.  Per my prior note: [name] A1c  is not at goal of < 7%.  [name] basal control is very poor at this time.  She is also having some large post-prandial elevations during her large, single meal of the day.  Encouraged to eat smaller, more frequent meals to take advantage of Mounjaro  plus the SSI from Humalog .  Will increase her Tresiba  to 70 units qhs and have her titrate by 2 units every 2 days until her FBG < 140 mg/dL.  Will maintain her current Mounjaro  dose for now and will focus on basal control.  Will switch pt over to the [place] d/t her difficulty remembering to scan her [place] an adequate amount of times to provide 24hr data.  Will reassess with CGM data in 3 weeks.    Pt not seen by me since 08/28/2022.    Pt last seen by Jovita Nipper, MD on 08/01/2023.  Per her note: Previously well controlled on regimen of Tresiba  and SS Humalog  with HbA1c 6.5 in 03/2019. However, progressively worsened due to weight gain and difficulty obtaining insulin  at times.  Now prescribed Freestyle 3 CGM to help with close monitoring of blood sugars.  Established care with Pharm.D. Eastyn Skalla on 07/08/2022 and had one follow-up appointment on 08/28/2022, but again lost to follow-up due to insurance issues.  Reports titrated Tresiba  on her own and currently taking 70 units daily and  Mounjaro  5 mg weekly with HbA1c stable at 9.2 today.  Scheduled to reestablish care with Pharm.D. Gracelin Weisberg on 08/11/2023 to review CGM data to help with adjustment in therapy.  Advised to titrate dose of tirzepatide  to 7.5 mg for 4 weeks and subsequently to 10 mg weekly.  No evidence of microvascular complications. On statin and on lisinopril  since 04/2020. Continue regular eye exams with Dr. Iacobucci.  Overdue and reports scheduled.  Foot exam grossly normal (02/2022), and no evidence of albuminuria (8 mg/g). Emphasized importance of lifestyle modifications, including low carbohydrate diet, regular exercise, and weight loss.  Will reassess HbA1c at next visit.     Today:   ***    Current  anti-hyperglycemic regimen includes:    Key Antihyperglycemic Medications              Tirzepatide  7.5 MG/0.5ML SOAJ Inject 7.5 mg into the skin every 7 days for 4 doses    Tirzepatide  10 MG/0.5ML SOAJ Inject 10 mg into the skin every 7 days Began after completing 7.5 mg for 4 weeks.    Insulin  Degludec (TRESIBA  FLEXTOUCH) 200 UNIT/ML SOPN INJECT 70 UNITS INTO THE SKIN EVERY EVENING ADJUST AS DIRECTED.    Tirzepatide  (MOUNJARO ) 5 MG/0.5ML SOAJ Inject 5 mg into the skin every 7 days    insulin  lispro, 1 Unit Dial , (HUMALOG /ADMELOG ) 100 UNIT/ML SOPN CHECK BLOOD GLUCOSE PRIOR TO MEALS AND AT BEDTIME. TAKE ACCORDING TO SLIDING SCALE FOR BS 180-200 USE 2 UNITS, 201-220 USE 4 UNITS, 221-240 USE 5 UNITS, 241-280 USE 6 UNITS, 281-320 USE 7 UNITS, 321-400 USE 8 UNITS, GREATER THAN 400 CALL DOCTOR          Complete current medication regimen includes:  Current Outpatient Medications   Medication Sig    montelukast  (SINGULAIR ) 10 MG tablet TAKE 1 TABLET BY MOUTH DAILY TO CONTROL ASTHMA    atorvastatin  (LIPITOR) 20 MG tablet TAKE 1 TABLET BY MOUTH DAILY    lisinopril  (PRINIVIL ;ZESTRIL ) 10 MG tablet TAKE ONE TABLET BY MOUTH EVERY DAY    pregabalin  (LYRICA ) 200 MG capsule Take 1 capsule by mouth nightly for 180 days. Max Daily Amount: 200 mg    Tirzepatide  7.5 MG/0.5ML SOAJ Inject 7.5 mg into the skin every 7 days for 4 doses    Tirzepatide  10 MG/0.5ML SOAJ Inject 10 mg into the skin every 7 days Began after completing 7.5 mg for 4 weeks.    DULoxetine  (CYMBALTA ) 30 MG extended release capsule Take 1 capsule by mouth 2 times daily    Continuous Glucose Sensor (FREESTYLE LIBRE 3 SENSOR) MISC USE TO MONITOR BLOOD GLUCOSE CONTINUOUSLY - CHANGE EVERY 14 DAYS    cyclobenzaprine  (FLEXERIL ) 10 MG tablet TAKE 1 TABLET BY MOUTH THREE TIMES A DAY AS NEEDED FOR MUSCLE SPASMS    meclizine  (ANTIVERT ) 25 MG tablet TAKE ONE TABLET BY MOUTH THREE TIMES A DAY AS NEEDED FOR DIZZINESS    KLOR-CON  M20 20 MEQ extended release tablet TAKE TWO TABLETS  BY MOUTH EVERY DAY    Insulin  Degludec (TRESIBA  FLEXTOUCH) 200 UNIT/ML SOPN INJECT 70 UNITS INTO THE SKIN EVERY EVENING ADJUST AS DIRECTED.    magnesium  oxide (MAG-OX) 400 MG tablet Take 2 tablets by mouth 2 times daily    Tirzepatide  (MOUNJARO ) 5 MG/0.5ML SOAJ Inject 5 mg into the skin every 7 days    insulin  lispro, 1 Unit Dial , (HUMALOG /ADMELOG ) 100 UNIT/ML SOPN CHECK BLOOD GLUCOSE PRIOR TO MEALS AND AT BEDTIME. TAKE ACCORDING TO SLIDING SCALE FOR BS 180-200 USE 2 UNITS, 201-220  USE 4 UNITS, 221-240 USE 5 UNITS, 241-280 USE 6 UNITS, 281-320 USE 7 UNITS, 321-400 USE 8 UNITS, GREATER THAN 400 CALL DOCTOR    omeprazole  (PRILOSEC  OTC) 20 MG tablet Take 1 tablet by mouth daily    beclomethasone (QVAR REDIHALER) 80 MCG/ACT AERB inhaler Inhale 2 puffs into the lungs in the morning and 2 puffs in the evening.    Insulin  Pen Needle 32G X 4 MM MISC 1 each by Does not apply route daily    BIOTIN PO Take 1 tablet by mouth daily    ZINC  PO Take by mouth    albuterol sulfate HFA (PROVENTIL;VENTOLIN;PROAIR) 108 (90 Base) MCG/ACT inhaler Inhale 1 puff into the lungs every 4 hours as needed    albuterol (PROVENTIL) (2.5 MG/3ML) 0.083% nebulizer solution Inhale 3 mLs into the lungs every 4 hours as needed    vitamin D3 (CHOLECALCIFEROL) 125 MCG (5000 UT) TABS tablet Take 1 tablet by mouth daily    diphenhydrAMINE (BENADRYL) 25 MG capsule Take 1 capsule by mouth every 6 hours as needed    Specialty Vitamins Products (MAGNESIUM , AMINO ACID CHELATE,) 133 MG tablet Take 1 tablet by mouth 2 times daily    triamcinolone (KENALOG) 0.1 % cream APPLY TO AFFECTED AREA TWICE A DAY AS NEEDED FOR RASH    zolpidem (AMBIEN) 5 MG tablet Take 1 tablet by mouth.     No current facility-administered medications for this visit.     Allergies:  Allergies   Allergen Reactions    Penicillins Hives and Itching       Blood Glucose Monitoring (BGM) or CGM:  - Had access to home glucometer/blood glucose log/CGM reader today:  {YES/NO:19726}  -  Conducts/scans: ***   - Fasting BG today: ***  - Post-prandial:   -FBG: ***   -ac lunch: ***    -ac dinner: ***   -hs: ***    ROS:  Today, Pt endorses:  - {Symptoms of Hyperglycemia:17903:::1}  - {Symptoms of Hypoglycemia:17902:::1}    Lifestyle modification(s):  -     Medication Adherence/Access:  - Endorses adherence to current regimen?: {YES/NO:19726}    Vitals/Labs:  No results found for: "HBA1C"  Hemoglobin A1C   Date Value Ref Range Status   07/31/2023 9.2 (H) 4.8 - 5.6 % Final     Comment:                 Prediabetes: 5.7 - 6.4           Diabetes: >6.4           Glycemic control for adults with diabetes: <7.0     03/20/2023 9.2 (H) 4.8 - 5.6 % Final     Comment:                 Prediabetes: 5.7 - 6.4           Diabetes: >6.4           Glycemic control for adults with diabetes: <7.0     06/25/2022 9.8 (H) 4.8 - 5.6 % Final     Comment:                 Prediabetes: 5.7 - 6.4           Diabetes: >6.4           Glycemic control for adults with diabetes: <7.0         Screenings/Prevention Parameters:  -Diabetic Eye and Foot Exams:      Diabetes  Management   Topic Date Due    Diabetic retinal exam  10/20/2021    Diabetic foot exam  02/21/2023     -Microalbumin / Creatinine ratio:     No results found for: "MICROALBUR", "LABCREA"     Lab Results   Component Value Date    ALBCREAT 8 07/31/2023      No components found for: "MALBCREARAT"  -Immunizations:      Immunization History   Administered Date(s) Administered    COVID-19, MODERNA BLUE border, Primary or Immunocompromised, (age 12y+), IM, 100 mcg/0.58mL 06/16/2019, 07/14/2019    COVID-19, MODERNA Bivalent, (age 12y+), IM, 50 mcg/0.5 mL 09/13/2021    COVID-19, MODERNA, 2024/25, (age 12y+), IM, 70mcg/0.5mL 02/18/2022, 07/25/2022    COVID-19, PFIZER Bivalent, DO NOT Dilute, (age 12y+), IM, 30 mcg/0.3 mL 01/29/2021    COVID-19, PFIZER PURPLE top, DILUTE for use, (age 46 y+), 18mcg/0.3mL 03/12/2020, 10/22/2020    COVID-19, PFIZER, 2024/25, (age 12y+), IM, 59mcg/0.3mL  02/15/2023    Influenza Trivalent 02/27/2018, 02/23/2019, 03/01/2020    Influenza Virus Vaccine 02/18/2022    Influenza, FLUARIX, FLULAVAL, FLUZONE (age 75 mo+) and AFLURIA, (age 56 y+), Quadv PF, 0.37mL 01/23/2021, 02/15/2023    Pneumococcal, PCV20, PREVNAR 20, (age 6w+), IM, 0.5mL 01/23/2021    Pneumococcal, PPSV23, PNEUMOVAX 23, (age 2y+), SC/IM, 0.47mL 03/18/2019    TDaP, ADACEL (age 31y-64y), BOOSTRIX (age 10y+), IM, 0.2mL 03/18/2019    Zoster Recombinant (Shingrix) 06/11/2021, 09/13/2021       Additional Laboratory Parameters of Interest:   Estimation of renal function:  Lab Results   Component Value Date/Time    LABGLOM 59 07/31/2023 12:00 AM    LABGLOM 74 03/20/2023 12:00 AM    LABGLOM 66 06/25/2022 12:00 AM    LABGLOM >60 05/08/2022 01:28 PM    LABGLOM >60 01/21/2022 03:21 PM    LABGLOM 57 05/29/2021 02:59 PM    GFRAA >60 01/12/2021 11:49 AM    GFRAA >60 11/22/2020 11:12 AM    GFRAA >60 08/10/2020 03:38 PM     Wt Readings from Last 3 Encounters:   03/20/23 95.7 kg (211 lb)   06/26/22 94.3 kg (208 lb)   02/20/22 93.4 kg (206 lb)     Ht Readings from Last 1 Encounters:   03/20/23 1.727 m (5\' 8" )     -ASCVD Risk Score Parameters and Calculation    Race: Black / African American     BP Readings from Last 3 Encounters:   03/20/23 134/80   06/26/22 118/78   02/20/22 121/79        Lab Results   Component Value Date    CHOL 151 07/31/2023    CHOL 130 03/20/2023    CHOL 157 06/25/2022     Lab Results   Component Value Date    TRIG 161 (H) 07/31/2023    TRIG 168 (H) 03/20/2023    TRIG 157 (H) 06/25/2022     Lab Results   Component Value Date    HDL 42 07/31/2023    HDL 40 03/20/2023    HDL 48 06/25/2022     No components found for: "LDLCHOLESTEROL", "LDLCALC"     Social History     Tobacco Use    Smoking status: Never    Smokeless tobacco: Never   Substance Use Topics    Alcohol use: No         Calculated ASCVD Risk Score: The 10-year ASCVD risk score (Arnett DK, et al., 2019) is: 17.8%    Values used to  calculate the  score:      Age: 36 years      Sex: Female      Is Non-Hispanic African American: Yes      Diabetic: Yes      Tobacco smoker: No      Systolic Blood Pressure: 134 mmHg      Is BP treated: Yes      HDL Cholesterol: 42 mg/dL      Total Cholesterol: 151 mg/dL    -Statin Safety Parameters:      Lab Results   Component Value Date    ALT 23 07/31/2023    AST 19 07/31/2023    GGT 97 (H) 05/04/2020    ALKPHOS 121 07/31/2023    BILITOT 1.0 07/31/2023       Calculated estimated creatinine clearance: CrCl cannot be calculated (Unknown ideal weight.).    Vital Signs Today:    There were no vitals taken for this visit.    There are no discontinued medications.    No orders of the defined types were placed in this encounter.      Future Appointments   Date Time Provider Department Center   09/04/2023  1:00 PM [facility] BONE DEXA RM 1 [facility] [place]   09/08/2023  1:00 PM Jennette Moder [facility]   11/03/2023 11:30 AM [facility] LAB VISIT [facility] DE[facility]   11/11/2023  2:30 PM Sarris, Tiburcio Fly, MD [facility] DE[facility]       Patient verbalized understanding of the information presented and all of the patient's questions were answered.  AVS was handed to the patient. Patient advised to call the office with any additional questions or concerns.    Notifications of recommendations will be sent to Jovita Nipper, MD for review.      Thank you for the consult,  Lalla Pill, PharmD, BCACP, BC-ADM        {[facility] [employer]

## 2023-09-08 NOTE — Patient Instructions (Signed)
-   Continue Tresiba  70 units every night    - Change Humalog  to 5 units before each meal PLUS    Correction Factor:  < 150: 0 units  150-199: 1 unit  200-249: 2 units  250-299: 3 units  300-349: 4 units  350-399: 5 units  > 400: 6 units    - Increase Mounjaro  to 7.5mg  weekly for 4 weeks, then increase to 10mg  weekly

## 2023-09-08 NOTE — Progress Notes (Signed)
 Pharmacy Progress Note - Diabetes Management       Assessment / Plan:   Diabetes Management:  Per ADA guidelines, [name] A1c is not at goal of < 7%.  [name] most recent Libre 3 AGP report for 14 days (ending 4/13) shows time in target range 16%, average glucose 275 mg/dL, and GMI 9.5%.  Poor adherence to her Humalog  dosing has led to poor prandial control.  Her basal control is very poor, but will focus on the prandial control at this time.  May need to increase her dose of Tresiba  at the next visit.  Will continue her dose titration of Mounjaro  to 7.5mg  weekly x4 weeks, then increase to 10mg  weekly.  Will have her restart use of her Jerrilyn Moras 3 so that her CGM data can be assessed at follow up in 5 weeks.    Hyperlipidemia:  The 10-year ASCVD risk score (Arnett DK, et al., 2019) is: 17.8% based on parameters listed. Current lipid treatment guidelines recommend at least moderate-intensity statin doses for all patients with diabetes to decrease overall ASCVD risk. Patient currently qualifies for a moderate intensity statin therapy based on current recommendations and is currently taking a moderate intensity statin.      Nutrition/Lifestyle Modifications:  - Educated pt on the importance of moderating carbohydrate intake. Reviewed sources of carbohydrates and method to help determine appropriate portion sizes (e.g., Diabetes Plate Method).  - Advised patient to avoid sugar-sweetened beverages and replace with water or diet/zero sugar option.  - Recommend ~30 minutes consistent, moderately intensive, exercise/day or ~150 minutes/week. Start small, stay consistent, and increase length and types of exercise, as tolerated.       Patient will return to clinic in 5 week(s) for follow up.        S/O: [name] Wu, a 63 y.o. 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  Pt was seen today for diabetes management.  Patient's last A1c was:   Hemoglobin A1C   Date Value Ref Range Status   07/31/2023 9.2 (H) 4.8 - 5.6 % Final     Comment:                 Prediabetes: 5.7 - 6.4           Diabetes: >6.4           Glycemic control for adults with diabetes: <7.0         Interim update: Pt was last seen by me on 08/28/2022.  Per my prior note: [name] A1c is not at goal of < 7%.  [name] basal control is very poor at this time.  She is also having some large post-prandial elevations during her large, single meal of the day.  Encouraged to eat smaller, more frequent meals to take advantage of Mounjaro  plus the SSI from Humalog .  Will increase her Tresiba  to 70 units qhs and have her titrate by 2 units every 2 days until her FBG < 140 mg/dL.  Will maintain her current Mounjaro  dose for now and will focus on basal control.  Will switch pt over to the [place] d/t her difficulty remembering to scan her [place] an adequate amount of times to provide 24hr data.  Will reassess with CGM data in  3 weeks.    Pt not seen by me since 08/28/2022.    Pt last seen by Jovita Nipper, MD on 08/01/2023.  Per her note: Previously well controlled on regimen of Tresiba  and SS Humalog  with HbA1c 6.5 in 03/2019. However, progressively worsened due to weight gain and difficulty obtaining insulin  at times.  Now prescribed Freestyle 3 CGM to help with close monitoring of blood sugars.  Established care with Pharm.D. Annaleah Arata on 07/08/2022 and had one follow-up appointment on 08/28/2022, but again lost to follow-up due to insurance issues.  Reports titrated Tresiba  on her own and currently taking 70 units daily and Mounjaro  5 mg weekly with HbA1c stable at 9.2 today.  Scheduled to reestablish care with Pharm.D. Jadia Capers on 08/11/2023 to review CGM data to help with  adjustment in therapy.  Advised to titrate dose of tirzepatide  to 7.5 mg for 4 weeks and subsequently to 10 mg weekly.  No evidence of microvascular complications. On statin and on lisinopril  since 04/2020. Continue regular eye exams with Dr. Iacobucci.  Overdue and reports scheduled.  Foot exam grossly normal (02/2022), and no evidence of albuminuria (8 mg/g). Emphasized importance of lifestyle modifications, including low carbohydrate diet, regular exercise, and weight loss.  Will reassess HbA1c at next visit.     Today:   She moved 2 weeks ago and hasn't found her Jerrilyn Moras 3 sensors yet to put one on.  She states that she will look for it when she gets home to restart its use.    She was due for the Mounjaro  7.5mg  initial dose yesterday, but the pharmacy tried to fill the Zepbound.  Advised would send new Rx to increase the dose.    She has not been giving her Humalog  injections prior to meals.  Advised would need an injection prior to each meal plus a correction factor.  Educated on correction factor and she verbalized understanding.    Current anti-hyperglycemic regimen includes:    Key Antihyperglycemic Medications              Tirzepatide  7.5 MG/0.5ML SOAJ Inject 7.5 mg into the skin every 7 days for 4 doses    Tirzepatide  10 MG/0.5ML SOAJ (Not Taking) Inject 10 mg into the skin every 7 days Began after completing 7.5 mg for 4 weeks.    Insulin  Degludec (TRESIBA  FLEXTOUCH) 200 UNIT/ML SOPN INJECT 70 UNITS INTO THE SKIN EVERY EVENING ADJUST AS DIRECTED.    Tirzepatide  (MOUNJARO ) 5 MG/0.5ML SOAJ (Taking) Inject 5 mg into the skin every 7 days    insulin  lispro, 1 Unit Dial , (HUMALOG /ADMELOG ) 100 UNIT/ML SOPN CHECK BLOOD GLUCOSE PRIOR TO MEALS AND AT BEDTIME. TAKE ACCORDING TO SLIDING SCALE FOR BS 180-200 USE 2 UNITS, 201-220 USE 4 UNITS, 221-240 USE 5 UNITS, 241-280 USE 6 UNITS, 281-320 USE 7 UNITS, 321-400 USE 8 UNITS, GREATER THAN 400 CALL DOCTOR          Complete current medication regimen includes:  Current  Outpatient Medications   Medication Sig    montelukast  (SINGULAIR ) 10 MG tablet TAKE 1 TABLET BY MOUTH DAILY TO CONTROL ASTHMA    atorvastatin  (LIPITOR) 20 MG tablet TAKE 1 TABLET BY MOUTH DAILY    lisinopril  (PRINIVIL ;ZESTRIL ) 10 MG tablet TAKE ONE TABLET BY MOUTH EVERY DAY    pregabalin  (LYRICA ) 200 MG capsule Take 1 capsule by mouth nightly for 180 days. Max Daily Amount: 200 mg    Tirzepatide  7.5 MG/0.5ML SOAJ Inject 7.5 mg into the skin every 7 days for 4  doses    Tirzepatide  10 MG/0.5ML SOAJ Inject 10 mg into the skin every 7 days Began after completing 7.5 mg for 4 weeks. (Patient not taking: Reported on 09/08/2023)    DULoxetine  (CYMBALTA ) 30 MG extended release capsule Take 1 capsule by mouth 2 times daily    Continuous Glucose Sensor (FREESTYLE LIBRE 3 SENSOR) MISC USE TO MONITOR BLOOD GLUCOSE CONTINUOUSLY - CHANGE EVERY 14 DAYS    cyclobenzaprine  (FLEXERIL ) 10 MG tablet TAKE 1 TABLET BY MOUTH THREE TIMES A DAY AS NEEDED FOR MUSCLE SPASMS    meclizine  (ANTIVERT ) 25 MG tablet TAKE ONE TABLET BY MOUTH THREE TIMES A DAY AS NEEDED FOR DIZZINESS    KLOR-CON  M20 20 MEQ extended release tablet TAKE TWO TABLETS BY MOUTH EVERY DAY    Insulin  Degludec (TRESIBA  FLEXTOUCH) 200 UNIT/ML SOPN INJECT 70 UNITS INTO THE SKIN EVERY EVENING ADJUST AS DIRECTED.    magnesium  oxide (MAG-OX) 400 MG tablet Take 2 tablets by mouth 2 times daily    Tirzepatide  (MOUNJARO ) 5 MG/0.5ML SOAJ Inject 5 mg into the skin every 7 days    insulin  lispro, 1 Unit Dial , (HUMALOG /ADMELOG ) 100 UNIT/ML SOPN CHECK BLOOD GLUCOSE PRIOR TO MEALS AND AT BEDTIME. TAKE ACCORDING TO SLIDING SCALE FOR BS 180-200 USE 2 UNITS, 201-220 USE 4 UNITS, 221-240 USE 5 UNITS, 241-280 USE 6 UNITS, 281-320 USE 7 UNITS, 321-400 USE 8 UNITS, GREATER THAN 400 CALL DOCTOR    omeprazole  (PRILOSEC  OTC) 20 MG tablet Take 1 tablet by mouth daily    beclomethasone (QVAR REDIHALER) 80 MCG/ACT AERB inhaler Inhale 2 puffs into the lungs in the morning and 2 puffs in the evening.     Insulin  Pen Needle 32G X 4 MM MISC 1 each by Does not apply route daily    BIOTIN PO Take 1 tablet by mouth daily    ZINC  PO Take by mouth    albuterol sulfate HFA (PROVENTIL;VENTOLIN;PROAIR) 108 (90 Base) MCG/ACT inhaler Inhale 1 puff into the lungs every 4 hours as needed    albuterol (PROVENTIL) (2.5 MG/3ML) 0.083% nebulizer solution Inhale 3 mLs into the lungs every 4 hours as needed    vitamin D3 (CHOLECALCIFEROL) 125 MCG (5000 UT) TABS tablet Take 1 tablet by mouth daily    diphenhydrAMINE (BENADRYL) 25 MG capsule Take 1 capsule by mouth every 6 hours as needed    Specialty Vitamins Products (MAGNESIUM , AMINO ACID CHELATE,) 133 MG tablet Take 1 tablet by mouth 2 times daily    triamcinolone (KENALOG) 0.1 % cream APPLY TO AFFECTED AREA TWICE A DAY AS NEEDED FOR RASH    zolpidem (AMBIEN) 5 MG tablet Take 1 tablet by mouth.     No current facility-administered medications for this visit.     Allergies:  Allergies   Allergen Reactions    Penicillins Hives and Itching       Blood Glucose Monitoring (BGM) or CGM:      ROS:  Today, Pt endorses:  - Symptoms of Hyperglycemia: none  - Symptoms of Hypoglycemia: none    Lifestyle modification(s):  - none    Medication Adherence/Access:  - Endorses adherence to current regimen?: No    Vitals/Labs:  No results found for: "HBA1C"  Hemoglobin A1C   Date Value Ref Range Status   07/31/2023 9.2 (H) 4.8 - 5.6 % Final     Comment:                 Prediabetes: 5.7 - 6.4  Diabetes: >6.4           Glycemic control for adults with diabetes: <7.0     03/20/2023 9.2 (H) 4.8 - 5.6 % Final     Comment:                 Prediabetes: 5.7 - 6.4           Diabetes: >6.4           Glycemic control for adults with diabetes: <7.0     06/25/2022 9.8 (H) 4.8 - 5.6 % Final     Comment:                 Prediabetes: 5.7 - 6.4           Diabetes: >6.4           Glycemic control for adults with diabetes: <7.0         Screenings/Prevention Parameters:  -Diabetic Eye and Foot Exams:       Diabetes Management   Topic Date Due    Diabetic retinal exam  10/20/2021    Diabetic foot exam  02/21/2023     -Microalbumin / Creatinine ratio:     No results found for: "MICROALBUR", "LABCREA"     Lab Results   Component Value Date    ALBCREAT 8 07/31/2023      No components found for: "MALBCREARAT"  -Immunizations:      Immunization History   Administered Date(s) Administered    COVID-19, MODERNA BLUE border, Primary or Immunocompromised, (age 12y+), IM, 100 mcg/0.44mL 06/16/2019, 07/14/2019    COVID-19, MODERNA Bivalent, (age 12y+), IM, 50 mcg/0.5 mL 09/13/2021    COVID-19, MODERNA, 2024/25, (age 12y+), IM, 33mcg/0.5mL 02/18/2022, 07/25/2022    COVID-19, PFIZER Bivalent, DO NOT Dilute, (age 12y+), IM, 30 mcg/0.3 mL 01/29/2021    COVID-19, PFIZER PURPLE top, DILUTE for use, (age 11 y+), 62mcg/0.3mL 03/12/2020, 10/22/2020    COVID-19, PFIZER, 2024/25, (age 12y+), IM, 2mcg/0.3mL 02/15/2023    Influenza Trivalent 02/27/2018, 02/23/2019, 03/01/2020    Influenza Virus Vaccine 02/18/2022    Influenza, FLUARIX, FLULAVAL, FLUZONE (age 74 mo+) and AFLURIA, (age 63 y+), Quadv PF, 0.52mL 01/23/2021, 02/15/2023    Pneumococcal, PCV20, PREVNAR 20, (age 6w+), IM, 0.21mL 01/23/2021    Pneumococcal, PPSV23, PNEUMOVAX 23, (age 2y+), SC/IM, 0.33mL 03/18/2019    TDaP, ADACEL (age 24y-64y), BOOSTRIX (age 10y+), IM, 0.75mL 03/18/2019    Zoster Recombinant (Shingrix) 06/11/2021, 09/13/2021       Additional Laboratory Parameters of Interest:   Estimation of renal function:  Lab Results   Component Value Date/Time    LABGLOM 59 07/31/2023 12:00 AM    LABGLOM 74 03/20/2023 12:00 AM    LABGLOM 66 06/25/2022 12:00 AM    LABGLOM >60 05/08/2022 01:28 PM    LABGLOM >60 01/21/2022 03:21 PM    LABGLOM 57 05/29/2021 02:59 PM    GFRAA >60 01/12/2021 11:49 AM    GFRAA >60 11/22/2020 11:12 AM    GFRAA >60 08/10/2020 03:38 PM     Wt Readings from Last 3 Encounters:   03/20/23 95.7 kg (211 lb)   06/26/22 94.3 kg (208 lb)   02/20/22 93.4 kg (206 lb)      Ht Readings from Last 1 Encounters:   03/20/23 1.727 m (5\' 8" )     -ASCVD Risk Score Parameters and Calculation    Race: Black / African American     BP Readings from Last 3 Encounters:   03/20/23 134/80  06/26/22 118/78   02/20/22 121/79        Lab Results   Component Value Date    CHOL 151 07/31/2023    CHOL 130 03/20/2023    CHOL 157 06/25/2022     Lab Results   Component Value Date    TRIG 161 (H) 07/31/2023    TRIG 168 (H) 03/20/2023    TRIG 157 (H) 06/25/2022     Lab Results   Component Value Date    HDL 42 07/31/2023    HDL 40 03/20/2023    HDL 48 06/25/2022     No components found for: "LDLCHOLESTEROL", "LDLCALC"     Social History     Tobacco Use    Smoking status: Never    Smokeless tobacco: Never   Substance Use Topics    Alcohol use: No         Calculated ASCVD Risk Score: The 10-year ASCVD risk score (Arnett DK, et al., 2019) is: 17.8%    Values used to calculate the score:      Age: 74 years      Sex: Female      Is Non-Hispanic African American: Yes      Diabetic: Yes      Tobacco smoker: No      Systolic Blood Pressure: 134 mmHg      Is BP treated: Yes      HDL Cholesterol: 42 mg/dL      Total Cholesterol: 151 mg/dL    -Statin Safety Parameters:      Lab Results   Component Value Date    ALT 23 07/31/2023    AST 19 07/31/2023    GGT 97 (H) 05/04/2020    ALKPHOS 121 07/31/2023    BILITOT 1.0 07/31/2023       Calculated estimated creatinine clearance: estimated creatinine clearance is 65 mL/min (A) (based on SCr of 1.07 mg/dL (H)).    Vital Signs Today:    There were no vitals taken for this visit.    Medications Discontinued During This Encounter   Medication Reason    Tirzepatide  7.5 MG/0.5ML SOAJ LIST CLEANUP    Tirzepatide  10 MG/0.5ML SOAJ LIST CLEANUP    Tirzepatide  (MOUNJARO ) 5 MG/0.5ML SOAJ LIST CLEANUP    insulin  lispro, 1 Unit Dial , (HUMALOG /ADMELOG ) 100 UNIT/ML SOPN DOSE ADJUSTMENT       Orders Placed This Encounter    Tirzepatide  (MOUNJARO ) 7.5 MG/0.5ML SOAJ pen     Sig: Inject 7.5 mg  into the skin once a week - MOUNJARO      Dispense:  2 mL     Refill:  0    Tirzepatide  (MOUNJARO ) 10 MG/0.5ML SOAJ pen     Sig: Inject 10 mg into the skin once a week Start after completing the 7.5mg  dose - MOUNJARO      Dispense:  2 mL     Refill:  0    insulin  lispro, 1 Unit Dial , (HUMALOG /ADMELOG ) 100 UNIT/ML SOPN     Sig: Inject 5 Units into the skin 3 times daily (before meals) Plus sliding scale provided     Dispense:  3 mL     Refill:  5       Future Appointments   Date Time Provider Department Center   10/07/2023 11:30 AM [facility] BONE DEXA RM 1 [facility] [place]   10/14/2023  3:00 PM Jennette Moder, RPH [facility]   11/03/2023 11:30 AM IOC LAB VISIT [facility]   11/11/2023  2:30 PM Sarris, Tiburcio Fly, MD [facility]       Patient verbalized understanding of the information presented and all of the patient's questions were answered.  AVS was handed to the patient. Patient advised to call the office with any additional questions or concerns.    Notifications of recommendations will be sent to Jovita Nipper, MD for review.      Thank you for the consult,  Lalla Pill, PharmD, BCACP, BC-ADM        For Pharmacy Admin Tracking Only    Program: Medical Group  CPA in place:  Yes  Recommendation Provided To: Patient/Caregiver: 7 via In person  Intervention Detail: Adherence Monitoring: 1, Dose Adjustment: 2, reason: Therapy Optimization, New Rx: 2, reason: Needs Additional Therapy, Refill(s) Provided, and Scheduled Appointment  Intervention Accepted By: Patient/Caregiver: 7  Gap Closed?: No   Time Spent (min): 30

## 2023-09-23 ENCOUNTER — Encounter: Payer: Medicare (Managed Care) | Attending: Internal Medicine | Primary: Internal Medicine

## 2023-10-07 ENCOUNTER — Ambulatory Visit: Payer: Medicare (Managed Care) | Primary: Internal Medicine

## 2023-10-14 ENCOUNTER — Encounter: Payer: Medicare (Managed Care) | Primary: Internal Medicine

## 2023-10-14 NOTE — Progress Notes (Deleted)
 Pharmacy Progress Note - Diabetes Management       Assessment / Plan:   Diabetes Management:  Per ADA guidelines, [name] A1c {IS/IS NOT:19932} at goal of < 7%.  ***     Recommendations to [name] Nipper, MD:    - ***     Nutrition/Lifestyle Modifications:  - Educated pt on the importance of moderating carbohydrate intake. Reviewed sources of carbohydrates and method to help determine appropriate portion sizes (e.g., Diabetes Plate Method).  - Advised patient to avoid sugar-sweetened beverages and replace with water or diet/zero sugar option.  - Recommend ~30 minutes consistent, moderately intensive, exercise/day or ~150 minutes/week. Start small, stay consistent, and increase length and types of exercise, as tolerated.       Patient will return to clinic in *** week(s) for follow up.        S/O: [name] Wu, a 63 y.o. 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  Pt was seen today virtually via MyChart video visit for diabetes management.  Patient's last A1c was:   Hemoglobin A1C   Date Value Ref Range Status   07/31/2023 9.2 (H) 4.8 - 5.6 % Final     Comment:                 Prediabetes: 5.7 - 6.4           Diabetes: >6.4           Glycemic control for adults with diabetes: <7.0         Interim update: Pt was last seen by me on 09/08/2023.  Per my prior note: [name] A1c is not at goal of < 7%.  [name] most recent Libre 3 AGP report for 14 days (ending 4/13) shows time in target range 16%, average glucose 275 mg/dL, and GMI 6.0%.  Poor adherence to her Humalog  dosing has led to poor prandial control.  Her basal control is very poor, but will focus on the prandial control at this time.   May need to increase her dose of Tresiba  at the next visit.  Will continue her dose titration of Mounjaro  to 7.5mg  weekly x4 weeks, then increase to 10mg  weekly.  Will have her restart use of her Jerrilyn Moras 3 so that her CGM data can be assessed at follow up in 5 weeks.    Today:   ***    Current anti-hyperglycemic regimen includes:    Key Antihyperglycemic Medications              Tirzepatide  (MOUNJARO ) 7.5 MG/0.5ML SOAJ pen Inject 7.5 mg into the skin once a week - MOUNJARO     Tirzepatide  (MOUNJARO ) 10 MG/0.5ML SOAJ pen Inject 10 mg into the skin once a week Start after completing the 7.5mg  dose - MOUNJARO     insulin  lispro, 1 Unit Dial , (HUMALOG /ADMELOG ) 100 UNIT/ML SOPN Inject 5 Units into the skin 3 times daily (before meals) Plus sliding scale provided    Insulin  Degludec (TRESIBA  FLEXTOUCH) 200 UNIT/ML SOPN INJECT 70 UNITS INTO THE SKIN EVERY EVENING ADJUST AS DIRECTED.          Complete current medication regimen includes:  Current Outpatient Medications   Medication Sig    Tirzepatide  (MOUNJARO ) 7.5 MG/0.5ML SOAJ pen Inject 7.5 mg  into the skin once a week - MOUNJARO     Tirzepatide  (MOUNJARO ) 10 MG/0.5ML SOAJ pen Inject 10 mg into the skin once a week Start after completing the 7.5mg  dose - MOUNJARO     insulin  lispro, 1 Unit Dial , (HUMALOG /ADMELOG ) 100 UNIT/ML SOPN Inject 5 Units into the skin 3 times daily (before meals) Plus sliding scale provided    montelukast  (SINGULAIR ) 10 MG tablet TAKE 1 TABLET BY MOUTH DAILY TO CONTROL ASTHMA    atorvastatin  (LIPITOR) 20 MG tablet TAKE 1 TABLET BY MOUTH DAILY    lisinopril  (PRINIVIL ;ZESTRIL ) 10 MG tablet TAKE ONE TABLET BY MOUTH EVERY DAY    pregabalin  (LYRICA ) 200 MG capsule Take 1 capsule by mouth nightly for 180 days. Max Daily Amount: 200 mg    DULoxetine  (CYMBALTA ) 30 MG extended release capsule Take 1 capsule by mouth 2 times daily    Continuous Glucose Sensor (FREESTYLE LIBRE 3 SENSOR) MISC USE TO MONITOR BLOOD GLUCOSE CONTINUOUSLY - CHANGE EVERY 14 DAYS     cyclobenzaprine  (FLEXERIL ) 10 MG tablet TAKE 1 TABLET BY MOUTH THREE TIMES A DAY AS NEEDED FOR MUSCLE SPASMS    meclizine  (ANTIVERT ) 25 MG tablet TAKE ONE TABLET BY MOUTH THREE TIMES A DAY AS NEEDED FOR DIZZINESS    KLOR-CON  M20 20 MEQ extended release tablet TAKE TWO TABLETS BY MOUTH EVERY DAY    Insulin  Degludec (TRESIBA  FLEXTOUCH) 200 UNIT/ML SOPN INJECT 70 UNITS INTO THE SKIN EVERY EVENING ADJUST AS DIRECTED.    magnesium  oxide (MAG-OX) 400 MG tablet Take 2 tablets by mouth 2 times daily    omeprazole  (PRILOSEC  OTC) 20 MG tablet Take 1 tablet by mouth daily    beclomethasone (QVAR REDIHALER) 80 MCG/ACT AERB inhaler Inhale 2 puffs into the lungs in the morning and 2 puffs in the evening.    Insulin  Pen Needle 32G X 4 MM MISC 1 each by Does not apply route daily    BIOTIN PO Take 1 tablet by mouth daily    ZINC  PO Take by mouth    albuterol sulfate HFA (PROVENTIL;VENTOLIN;PROAIR) 108 (90 Base) MCG/ACT inhaler Inhale 1 puff into the lungs every 4 hours as needed    albuterol (PROVENTIL) (2.5 MG/3ML) 0.083% nebulizer solution Inhale 3 mLs into the lungs every 4 hours as needed    vitamin D3 (CHOLECALCIFEROL) 125 MCG (5000 UT) TABS tablet Take 1 tablet by mouth daily    diphenhydrAMINE (BENADRYL) 25 MG capsule Take 1 capsule by mouth every 6 hours as needed    Specialty Vitamins Products (MAGNESIUM , AMINO ACID CHELATE,) 133 MG tablet Take 1 tablet by mouth 2 times daily    triamcinolone (KENALOG) 0.1 % cream APPLY TO AFFECTED AREA TWICE A DAY AS NEEDED FOR RASH    zolpidem (AMBIEN) 5 MG tablet Take 1 tablet by mouth.     No current facility-administered medications for this visit.     Allergies:  Allergies   Allergen Reactions    Penicillins Hives and Itching       Blood Glucose Monitoring (BGM) or CGM:  - Had access to home glucometer/blood glucose log/CGM reader today:  {YES/NO:19726}  - Conducts/scans: ***   - Fasting BG today: ***  - Post-prandial:   -FBG: ***   -ac lunch: ***    -ac dinner: ***   -hs:  ***    ROS:  Today, Pt endorses:  - {Symptoms of Hyperglycemia:17903:::1}  - {Symptoms of Hypoglycemia:17902:::1}    Lifestyle modification(s):  -     Medication Adherence/Access:  -  Endorses adherence to current regimen?: {YES/NO:19726}    Vitals/Labs:  No results found for: "HBA1C"  Hemoglobin A1C   Date Value Ref Range Status   07/31/2023 9.2 (H) 4.8 - 5.6 % Final     Comment:                 Prediabetes: 5.7 - 6.4           Diabetes: >6.4           Glycemic control for adults with diabetes: <7.0     03/20/2023 9.2 (H) 4.8 - 5.6 % Final     Comment:                 Prediabetes: 5.7 - 6.4           Diabetes: >6.4           Glycemic control for adults with diabetes: <7.0     06/25/2022 9.8 (H) 4.8 - 5.6 % Final     Comment:                 Prediabetes: 5.7 - 6.4           Diabetes: >6.4           Glycemic control for adults with diabetes: <7.0         Screenings/Prevention Parameters:  -Diabetic Eye and Foot Exams:      Diabetes Management   Topic Date Due    Diabetic retinal exam  10/20/2021    Diabetic foot exam  02/21/2023    A1C test (Diabetic or Prediabetic)  10/31/2023     -Microalbumin / Creatinine ratio:     No results found for: "MICROALBUR", "LABCREA"     Lab Results   Component Value Date    ALBCREAT 8 07/31/2023      No components found for: "MALBCREARAT"  -Immunizations:      Immunization History   Administered Date(s) Administered    COVID-19, MODERNA BLUE border, Primary or Immunocompromised, (age 12y+), IM, 100 mcg/0.54mL 06/16/2019, 07/14/2019    COVID-19, MODERNA Bivalent, (age 12y+), IM, 50 mcg/0.5 mL 09/13/2021    COVID-19, MODERNA, 2024/25, (age 12y+), IM, 55mcg/0.5mL 02/18/2022, 07/25/2022    COVID-19, PFIZER Bivalent, DO NOT Dilute, (age 12y+), IM, 30 mcg/0.3 mL 01/29/2021    COVID-19, PFIZER PURPLE top, DILUTE for use, (age 60 y+), 46mcg/0.3mL 03/12/2020, 10/22/2020    COVID-19, PFIZER, 2024/25, (age 12y+), IM, 52mcg/0.3mL 02/15/2023    Influenza Trivalent 02/27/2018, 02/23/2019, 03/01/2020     Influenza Virus Vaccine 02/18/2022    Influenza, FLUARIX, FLULAVAL, FLUZONE (age 73 mo+) and AFLURIA, (age 61 y+), Quadv PF, 0.30mL 01/23/2021, 02/15/2023    Pneumococcal, PCV20, PREVNAR 20, (age 6w+), IM, 0.76mL 01/23/2021    Pneumococcal, PPSV23, PNEUMOVAX 23, (age 2y+), SC/IM, 0.50mL 03/18/2019    TDaP, ADACEL (age 67y-64y), BOOSTRIX (age 10y+), IM, 0.63mL 03/18/2019    Zoster Recombinant (Shingrix) 06/11/2021, 09/13/2021       Additional Laboratory Parameters of Interest:   Estimation of renal function:  Lab Results   Component Value Date/Time    LABGLOM 59 07/31/2023 12:00 AM    LABGLOM 74 03/20/2023 12:00 AM    LABGLOM 66 06/25/2022 12:00 AM    LABGLOM >60 05/08/2022 01:28 PM    LABGLOM >60 01/21/2022 03:21 PM    LABGLOM 57 05/29/2021 02:59 PM    GFRAA >60 01/12/2021 11:49 AM    GFRAA >60 11/22/2020 11:12 AM    GFRAA >60 08/10/2020 03:38 PM  Wt Readings from Last 3 Encounters:   03/20/23 95.7 kg (211 lb)   06/26/22 94.3 kg (208 lb)   02/20/22 93.4 kg (206 lb)     Ht Readings from Last 1 Encounters:   03/20/23 1.727 m (5\' 8" )     Calculated estimated creatinine clearance: CrCl cannot be calculated (Unknown ideal weight.).    Vital Signs Today:    There were no vitals taken for this visit.    There are no discontinued medications.    No orders of the defined types were placed in this encounter.      Future Appointments   Date Time Provider Department Center   11/03/2023 11:30 AM IOC LAB VISIT [name] DEP   11/11/2023  2:30 PM Sarris, Tiburcio Fly, MD [facility] DEP       Patient verbalized understanding of the information presented and all of the patient's questions were answered.  AVS was gone over with the patient. Patient advised to call the office with any additional questions or concerns.    Notifications of recommendations will be sent to [name] Nipper, MD for review.      Thank you for the consult,  Lalla Pill, PharmD, BCACP, BC-ADM        {[facility]  [employer]

## 2023-10-20 NOTE — Telephone Encounter (Signed)
**  Patient is to be rescheduled with the [facility] Ambulatory Pharmacist**    Attempt made to contact patient at the home number.    Missed MyChart appointment (no show) on 10/14/23 at 3:00 pm with Lalla Pill    Left a message requesting a call back at [phone] to schedule the appointment    Letter Sent    Laron Plummer [facility]   Ambulatory Pharmacy Technician Clinical Support Specialist  [phone]  Department, toll free: [phone], option 2     For Pharmacy Admin Tracking Only    Program: Medical Group  Gap Closed?: Yes   Time Spent (min): 5

## 2023-11-03 ENCOUNTER — Encounter: Payer: Medicare (Managed Care) | Primary: Internal Medicine

## 2023-11-10 ENCOUNTER — Other Ambulatory Visit: Admit: 2023-11-10 | Discharge: 2023-11-10 | Payer: Medicare (Managed Care) | Primary: Internal Medicine

## 2023-11-10 ENCOUNTER — Encounter

## 2023-11-10 DIAGNOSIS — E559 Vitamin D deficiency, unspecified: Principal | ICD-10-CM

## 2023-11-11 ENCOUNTER — Ambulatory Visit
Admit: 2023-11-11 | Discharge: 2023-11-11 | Payer: Medicare (Managed Care) | Attending: Internal Medicine | Primary: Internal Medicine

## 2023-11-11 ENCOUNTER — Encounter

## 2023-11-11 VITALS — BP 122/80 | HR 87 | Temp 98.10000°F | Resp 14 | Ht 68.0 in | Wt 207.0 lb

## 2023-11-11 DIAGNOSIS — G4730 Sleep apnea, unspecified: Principal | ICD-10-CM

## 2023-11-11 DIAGNOSIS — E1165 Type 2 diabetes mellitus with hyperglycemia: Secondary | ICD-10-CM

## 2023-11-11 LAB — COMPREHENSIVE METABOLIC PANEL
ALT: 30 IU/L (ref 0–32)
AST: 27 IU/L (ref 0–40)
Albumin: 4 g/dL (ref 3.9–4.9)
Alkaline Phosphatase: 125 IU/L — ABNORMAL HIGH (ref 44–121)
BUN/Creatinine Ratio: 9 — ABNORMAL LOW (ref 12–28)
BUN: 10 mg/dL (ref 8–27)
CO2: 25 mmol/L (ref 20–29)
Calcium: 10 mg/dL (ref 8.7–10.3)
Chloride: 100 mmol/L (ref 96–106)
Creatinine: 1.11 mg/dL — ABNORMAL HIGH (ref 0.57–1.00)
Est, Glom Filt Rate: 56 mL/min/{1.73_m2} — ABNORMAL LOW (ref >59)
Globulin, Total: 2.6 g/dL (ref 1.5–4.5)
Glucose: 270 mg/dL — ABNORMAL HIGH (ref 70–99)
Potassium: 4.9 mmol/L (ref 3.5–5.2)
Sodium: 141 mmol/L (ref 134–144)
Total Bilirubin: 0.5 mg/dL (ref 0.0–1.2)
Total Protein: 6.6 g/dL (ref 6.0–8.5)

## 2023-11-11 LAB — CBC WITH AUTO DIFFERENTIAL
Basophils %: 0 %
Basophils Absolute: 0 10*3/uL (ref 0.0–0.2)
Eosinophils %: 1 %
Eosinophils Absolute: 0.1 10*3/uL (ref 0.0–0.4)
Hematocrit: 44.5 % (ref 34.0–46.6)
Hemoglobin: 14.4 g/dL (ref 11.1–15.9)
Immature Grans (Abs): 0 10*3/uL (ref 0.0–0.1)
Immature Granulocytes %: 0 %
Lymphocytes %: 33 %
Lymphocytes Absolute: 3.3 10*3/uL — ABNORMAL HIGH (ref 0.7–3.1)
MCH: 31.1 pg (ref 26.6–33.0)
MCHC: 32.4 g/dL (ref 31.5–35.7)
MCV: 96 fL (ref 79–97)
Monocytes %: 5 %
Monocytes Absolute: 0.5 10*3/uL (ref 0.1–0.9)
Neutrophils %: 61 %
Neutrophils Absolute: 5.9 10*3/uL (ref 1.4–7.0)
Platelets: 216 10*3/uL (ref 150–450)
RBC: 4.63 x10E6/uL (ref 3.77–5.28)
RDW: 12.9 % (ref 11.7–15.4)
WBC: 9.8 10*3/uL (ref 3.4–10.8)

## 2023-11-11 LAB — VITAMIN D 25 HYDROXY: Vit D, 25-Hydroxy: 41.9 ng/mL (ref 30.0–100.0)

## 2023-11-11 LAB — LIPID PANEL
Cholesterol, Total: 129 mg/dL (ref 100–199)
HDL: 40 mg/dL (ref >39)
LDL Cholesterol: 56 mg/dL (ref 0–99)
Triglycerides: 203 mg/dL — ABNORMAL HIGH (ref 0–149)
VLDL Cholesterol Calculated: 33 mg/dL (ref 5–40)

## 2023-11-11 LAB — MAGNESIUM: Magnesium: 1.9 mg/dL (ref 1.6–2.3)

## 2023-11-11 LAB — TSH: TSH: 3.23 u[IU]/mL (ref 0.450–4.500)

## 2023-11-11 MED ORDER — MOUNJARO 10 MG/0.5ML SC SOAJ
10 | SUBCUTANEOUS | 1 refills | 28.00000 days | Status: DC
Start: 2023-11-11 — End: 2024-03-17

## 2023-11-11 NOTE — Progress Notes (Unsigned)
 [name] Wu Amsler presents today for   Chief Complaint   Patient presents with    Medicare AWV       Have you been to the ER, urgent care clinic since your last visit?  Hospitalized since your last visit?    Stated she went to urgent care for RSV, unable to give a date or month.    "Have you seen or consulted any other health care providers outside of [facility] since your last visit?"    NO       Have you had a mammogram?"   NO    Date of last Mammogram: 10/27/2020

## 2023-11-11 NOTE — Progress Notes (Signed)
 HPI:   [name] Wu is a 63 y.o. year old female who presents today for a routine follow-up visit.  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  She reports that she is doing reasonably well.      On 06/10/2023, she established care with Dr. 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  Extensive lab evaluation was performed and recommendation was to consider a bone marrow biopsy with MRD pending lab results.  It was also recommended that she undergo evaluation by pulmonary for PFT testing given her stem cell transplant, and she was referred to Dr. Angelyn at [facility].  She reported that she was scheduled on 08/22/2023, but did not keep the appointment.  It was also recommended that she stay up-to-date on her health maintenance screening testing, and she was urged to obtain a mammogram and Pap smear.  She no-showed for her mammogram appointment on 05/05/2023 and her well woman exam with NP Nicholaus on 07/29/2023. She was also advised to obtain the RSV vaccine.  She discusses today that she has not yet scheduled her mammogram or well woman exam with NP Nicholaus, and missed her follow-up visit with Dr. Dorrie on 09/16/2023.    She discusses today that she is no longer working since she found it to be too stressful and exhausting.  She states that since her stem cell transplant, she does not feel that she has ever returned to her prior stamina.  She does admit to severe snoring at night and admits to daytime fatigue and acknowledges that she likely has sleep apnea.  She states that she has never had this evaluated.  She states that she was planning to discuss with Dr. Angelyn as recommended by Dr. Dorrie, but did not keep the scheduled appointment as discussed.    She reestablished care with Pharm.D. Dail on 09/08/2023 and was  found to have poor diabetes control with TIR 16% and GMI 9.9%.  It was felt that her prandial control was poor due to poor adherence to Humalog  and she was advised to titrate her dose of tirzepatide . She reports that she is continuing to be compliant with her medications and states that she has titrated tirzepatide  to 10 mg weekly and Tresiba  70 units every evening.  She states that she also will use Humalog  as needed.  She reports that she is monitoring her blood sugar with the Freestyle libre 3 CGM.  She no-showed for her follow-up appointment with Pharm.D. Dail on 10/14/2023.  Review of her CGM data today shows TIR at only 14% and TBR at 0% over the last 30 days.    She discusses that her asthma control has significantly improved on Qvar, Singulair  and albuterol as needed.  She denies any recent exacerbation.    She acknowledges that she is overdue for an eye exam and states that she is scheduled for follow-up with Dr. Iacobucci.  She has not yet scheduled her bone density study and has not received the RSV vaccine as recommended.    She is otherwise without new complaints.        Summary of prior hospitalizations and medical history:   She has a history of acute lymphoblastic leukemia, diagnosed in 02/2017 after experiencing severe fatigue and bone pain for several months. She was positive for the Philadelphia chromosome mutation. She presented to the [facility] in [place] for care, and underwent chemotherapy which achieved remission. She subsequently underwent a stem cell transplant on 08/14/2017 which was felt to be curative. She had been on maintenance therapy with a tyrosine kinase inhibitor, nilotimib 200 mg bid. She had been followed by Dr. Randie Hake , transplant oncologist, and had been traveling to [facility] every three months for care. She had a follow-up visit with Dr. Hake in 12/2020 and treatment with nilotinib was discontinued and follow-up recommended on an annual basis.       He has a history of hypertension, treated with amlodipine. She has a history of hyperlipidemia, and was restarted on atorvastatin  in 07/2019.  She also has a history of diabetes mellitus, currently being treated with Tresiba , Humalog , and Ozempic . She denies any polyuria, polydipsia, nocturia, or blurry vision, and has no history of retinopathy or nephropathy. 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 She was under the care of pain management and being treated with Cymbalta  and Roxicodone as needed, but has noted a significant decrease in the need for narcotics since being started on pregabalin  in 02/2020.     She has a history of asthma since childhood, and is being maintained on Flovent and albuterol as needed. She denies any cough or shortness of breath.      In 04/2018, she was admitted to [facility] from 12/4-12/10/2017 for a UTI and then subsequently readmitted from 12/6-12/17/2019 with MRSA bacteremia related to a PICC line and bibasilar pneumonia.      He has a history of GERD, treated with Protonix. She also underwent a screening colonoscopy on 05/14/2018 by Dr. Elma at the [facility] in [place] to evaluate diarrhea post stem cell transplant. No lesions were found. She denies any abdominal pain, nausea, vomiting, melena, hematochezia, or change in bowel movements.       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)     H/O stem cell transplant (HCC) 041/04/19    Hyperlipidemia 03/22/2019    Hypertension     Insomnia 03/22/2019    Mild intermittent asthma without complication 03/18/2019    Peripheral neuropathy due to chemotherapy 03/18/2019     Past Surgical History:   Procedure  Laterality Date    XR MIDLINE EQUAL OR GREATER THAN 5 YEARS  04/22/2018    XR MIDLINE EQUAL OR GREATER THAN 5 YEARS 04/22/2018     Current Outpatient Medications   Medication Sig    Tirzepatide  (MOUNJARO ) 10 MG/0.5ML SOAJ pen Inject 10 mg into the skin once a week Start after completing the 7.5mg  dose - MOUNJARO     insulin  lispro, 1 Unit Dial , (HUMALOG /ADMELOG ) 100 UNIT/ML SOPN Inject 5 Units into the skin 3 times daily (before meals) Plus sliding scale provided    montelukast  (SINGULAIR ) 10 MG tablet TAKE 1 TABLET BY MOUTH DAILY TO CONTROL ASTHMA    atorvastatin  (LIPITOR) 20 MG tablet TAKE 1 TABLET BY MOUTH DAILY    lisinopril  (PRINIVIL ;ZESTRIL ) 10 MG tablet TAKE ONE TABLET BY MOUTH EVERY DAY    pregabalin  (LYRICA ) 200 MG capsule Take 1 capsule by mouth nightly for 180 days. Max Daily Amount: 200 mg  DULoxetine  (CYMBALTA ) 30 MG extended release capsule Take 1 capsule by mouth 2 times daily    Continuous Glucose Sensor (FREESTYLE LIBRE 3 SENSOR) MISC USE TO MONITOR BLOOD GLUCOSE CONTINUOUSLY - CHANGE EVERY 14 DAYS    cyclobenzaprine  (FLEXERIL ) 10 MG tablet TAKE 1 TABLET BY MOUTH THREE TIMES A DAY AS NEEDED FOR MUSCLE SPASMS    meclizine  (ANTIVERT ) 25 MG tablet TAKE ONE TABLET BY MOUTH THREE TIMES A DAY AS NEEDED FOR DIZZINESS    KLOR-CON  M20 20 MEQ extended release tablet TAKE TWO TABLETS BY MOUTH EVERY DAY    Insulin  Degludec (TRESIBA  FLEXTOUCH) 200 UNIT/ML SOPN INJECT 70 UNITS INTO THE SKIN EVERY EVENING ADJUST AS DIRECTED.    magnesium  oxide (MAG-OX) 400 MG tablet Take 2 tablets by mouth 2 times daily    omeprazole  (PRILOSEC  OTC) 20 MG tablet Take 1 tablet by mouth daily    beclomethasone (QVAR REDIHALER) 80 MCG/ACT AERB inhaler Inhale 2 puffs into the lungs in the morning and 2 puffs in the evening.    Insulin  Pen Needle 32G X 4 MM MISC 1 each by Does not apply route daily    BIOTIN PO Take 1 tablet by mouth daily    ZINC  PO Take by mouth    albuterol sulfate HFA (PROVENTIL;VENTOLIN;PROAIR) 108 (90 Base)  MCG/ACT inhaler Inhale 1 puff into the lungs every 4 hours as needed    albuterol (PROVENTIL) (2.5 MG/3ML) 0.083% nebulizer solution Inhale 3 mLs into the lungs every 4 hours as needed    vitamin D3 (CHOLECALCIFEROL) 125 MCG (5000 UT) TABS tablet Take 1 tablet by mouth daily    diphenhydrAMINE (BENADRYL) 25 MG capsule Take 1 capsule by mouth every 6 hours as needed    Specialty Vitamins Products (MAGNESIUM , AMINO ACID CHELATE,) 133 MG tablet Take 1 tablet by mouth 2 times daily    triamcinolone (KENALOG) 0.1 % cream APPLY TO AFFECTED AREA TWICE A DAY AS NEEDED FOR RASH    zolpidem (AMBIEN) 5 MG tablet Take 1 tablet by mouth.     No current facility-administered medications for this visit.     Allergies and Intolerances:   Allergies   Allergen Reactions    Penicillins Hives and Itching     Family History:  She has no family history of colon cancer of leukemia. Mother had breast cancer at the age of 48.   Family History   Problem Relation Age of Onset    Breast Cancer Mother      Social History:   She  reports that she has never smoked. She has never used smokeless tobacco. She is divorced and has no children. She is employed as a Research scientist (medical).  Social History     Substance and Sexual Activity   Alcohol Use No     Immunization History:  Immunization History   Administered Date(s) Administered    COVID-19, MODERNA BLUE border, Primary or Immunocompromised, (age 12y+), IM, 100 mcg/0.83mL 06/16/2019, 07/14/2019    COVID-19, MODERNA Bivalent, (age 12y+), IM, 50 mcg/0.5 mL 09/13/2021    COVID-19, MODERNA, 2024/25, (age 12y+), IM, 80mcg/0.5mL 02/18/2022, 07/25/2022    COVID-19, PFIZER Bivalent, DO NOT Dilute, (age 12y+), IM, 30 mcg/0.3 mL 01/29/2021    COVID-19, PFIZER PURPLE top, DILUTE for use, (age 27 y+), 73mcg/0.3mL 03/12/2020, 10/22/2020    COVID-19, PFIZER, 2024/25, (age 12y+), IM, 53mcg/0.3mL 02/15/2023    Influenza Trivalent 02/27/2018, 02/23/2019, 03/01/2020    Influenza Virus Vaccine 02/18/2022    Influenza,  FLUARIX, FLULAVAL, FLUZONE (age 48 mo+) and  AFLURIA, (age 51 y+), Quadv PF, 0.74mL 01/23/2021, 02/15/2023    Pneumococcal, PCV20, PREVNAR 20, (age 6w+), IM, 0.52mL 01/23/2021    Pneumococcal, PPSV23, PNEUMOVAX 23, (age 2y+), SC/IM, 0.5mL 03/18/2019    TDaP, ADACEL (age 37y-64y), MYRTICE (age 10y+), IM, 0.5mL 03/18/2019    Zoster Recombinant (Shingrix) 06/11/2021, 09/13/2021       Review of Systems:   As above included in HPI.  Otherwise 11 point review of systems negative including constitutional, skin, HENT, eyes, respiratory, cardiovascular, gastrointestinal, genitourinary, musculoskeletal, endocrine, hematologic, allergy, and neurologic.    Physical:   Vitals:    11/11/23 1449 11/11/23 1512   BP: 137/88 122/80   Pulse: 87    Resp: 14    Temp: 98.1 F (36.7 C)    TempSrc: Temporal    SpO2: 94%    Weight: 93.9 kg (207 lb)    Height: 1.727 m (5' 8)          Exam:   Patient appears in no apparent distress. Affect is appropriate.    HEENT --Anicteric sclerae, tympanic membranes normal,  ear canals normal.  PERRL, EOMI, conjunctiva and lids normal.  No cervical lymphadenopathy.  No thyromegaly, JVD, or bruits.  Carotid pulses 2+ with normal upstroke.  Lungs --Clear to auscultation. No wheezing or rales.  Heart --Regular rate and rhythm, no murmurs, rubs, gallops, or clicks.  Abdomen -- Soft and nontender, no hepatosplenomegaly or masses.  Extremities -- Without edema. Normal looking digits, ROM intact  Neuro -- CN 2-12 intact, strength 5/5 grossly intact  Derm - no obvious abnormalities noted, no rash            Review of Data:  Labs:  Lab on 11/10/2023   Component Date Value Ref Range Status    Vit D, 25-Hydroxy 11/10/2023 41.9  30.0 - 100.0 ng/mL Final    TSH 11/10/2023 3.230  0.450 - 4.500 uIU/mL Final    Magnesium  11/10/2023 1.9  1.6 - 2.3 mg/dL Final    Hemoglobin J8R 11/10/2023 9.4 (H)  %Hb Final    Estimated Avg Glucose 11/10/2023 223  mg/dL Final    Cholesterol, Total 11/10/2023 129  100 - 199 mg/dL Final     Triglycerides 11/10/2023 203 (H)  0 - 149 mg/dL Final    HDL 93/69/7974 40  >39 mg/dL Final    VLDL Cholesterol Calculated 11/10/2023 33  5 - 40 mg/dL Final    LDL Cholesterol 11/10/2023 56  0 - 99 mg/dL Final    Glucose 93/69/7974 270 (H)  70 - 99 mg/dL Final    BUN 93/69/7974 10  8 - 27 mg/dL Final    Creatinine 93/69/7974 1.11 (H)  0.57 - 1.00 mg/dL Final    Est, Glom Filt Rate 11/10/2023 56 (L)  >59 mL/min/1.73 Final    BUN/Creatinine Ratio 11/10/2023 9 (L)  12 - 28 Final    Sodium 11/10/2023 141  134 - 144 mmol/L Final    Potassium 11/10/2023 4.9  3.5 - 5.2 mmol/L Final    Chloride 11/10/2023 100  96 - 106 mmol/L Final    CO2 11/10/2023 25  20 - 29 mmol/L Final    Calcium  11/10/2023 10.0  8.7 - 10.3 mg/dL Final    Total Protein 11/10/2023 6.6  6.0 - 8.5 g/dL Final    Albumin 93/69/7974 4.0  3.9 - 4.9 g/dL Final    Globulin, Total 11/10/2023 2.6  1.5 - 4.5 g/dL Final    Total Bilirubin 11/10/2023 0.5  0.0 - 1.2 mg/dL  Final    Alkaline Phosphatase 11/10/2023 125 (H)  44 - 121 IU/L Final    AST 11/10/2023 27  0 - 40 IU/L Final    ALT 11/10/2023 30  0 - 32 IU/L Final    WBC 11/10/2023 9.8  3.4 - 10.8 x10E3/uL Final    RBC 11/10/2023 4.63  3.77 - 5.28 x10E6/uL Final    Hemoglobin 11/10/2023 14.4  11.1 - 15.9 g/dL Final    Hematocrit 93/69/7974 44.5  34.0 - 46.6 % Final    MCV 11/10/2023 96  79 - 97 fL Final    MCH 11/10/2023 31.1  26.6 - 33.0 pg Final    MCHC 11/10/2023 32.4  31.5 - 35.7 g/dL Final    RDW 93/69/7974 12.9  11.7 - 15.4 % Final    Platelets 11/10/2023 216  150 - 450 x10E3/uL Final    Neutrophils % 11/10/2023 61  Not Estab. % Final    Lymphocytes % 11/10/2023 33  Not Estab. % Final    Monocytes % 11/10/2023 5  Not Estab. % Final    Eosinophils % 11/10/2023 1  Not Estab. % Final    Basophils % 11/10/2023 0  Not Estab. % Final    Neutrophils Absolute 11/10/2023 5.9  1.4 - 7.0 x10E3/uL Final    Lymphocytes Absolute 11/10/2023 3.3 (H)  0.7 - 3.1 x10E3/uL Final    Monocytes Absolute 11/10/2023 0.5  0.1 - 0.9  x10E3/uL Final    Eosinophils Absolute 11/10/2023 0.1  0.0 - 0.4 x10E3/uL Final    Basophils Absolute 11/10/2023 0.0  0.0 - 0.2 x10E3/uL Final    Immature Granulocytes % 11/10/2023 0  Not Estab. % Final    Immature Grans (Abs) 11/10/2023 0.0  0.0 - 0.1 x10E3/uL Final            Health Maintenance:  Screening:               Mammogram: Right mammo/ultrasound negative (10/2020); Bilateral due in 01/2021  OVERDUE              PAP smear: well woman exam with NP Gretel (Pap/HPV negative on 02/15/2020) OVERDUE              Colorectal: colonoscopy (05/14/2018) normal.  Dr. Elma in [place].  Due 05/2028.              Depression: none              DM (HbA1c/FPG): FPG 270/HbA1c 9.4 (10/2023)              Hepatitis C: negative (03/2019)              Falls: none              DEXA: ordered              Glaucoma: regular eye exams with Dr. Iacobucci (last 10/2020)  OVERDUE              Smoking: none              Vitamin D: 41.9 (10/2023)              Medicare Wellness: today       Impression:  Patient Active Problem List   Diagnosis    Peripheral neuropathy due to chemotherapy    Acute lymphoblastic leukemia (ALL) in remission (HCC)    Essential hypertension    Type 2 diabetes mellitus with diabetic neuropathy, with long-term current use of insulin  (HCC)  S/P bone marrow transplant (HCC)    Eczema    Elevated alkaline phosphatase level    Primary osteoarthritis involving multiple joints    GERD (gastroesophageal reflux disease)    Vestibular neuronitis    Hyperlipidemia    Class 1 obesity due to excess calories with serious comorbidity in adult    Mild intermittent asthma    Noncompliance    Stage 3 chronic kidney disease (HCC)    Type 2 diabetes mellitus with chronic kidney disease, with long-term current use of insulin  (HCC)       Plan:  1. Diabetes mellitus. Previously well controlled on regimen of Tresiba  and SS Humalog  with HbA1c 6.5 in 03/2019. However, progressively worsened due to weight gain and difficulty obtaining insulin .   Acquired Freestyle 3 CGM to help with close monitoring of blood sugars.  Established care with Pharm.D. Dail but has not been consistent with follow-up due to insurance issues and noncompliance.  Reports titrated Tresiba  on her own and currently taking 70 units daily and Mounjaro  7.5 mg weekly with HbA1c worsened today to 9.4.  Review of CGM data shows TIR at 14% over the last month with no low readings.  Will increase dose of Mounjaro  to 10 mg weekly and advised to begin titration of Tresiba  so as to achieve better fasting blood glucose control in the morning.  Also advised to schedule follow-up with Pharm.D. Dail to help with medication adjustment.  No evidence of microvascular complications. On statin and on lisinopril . Continue regular eye exams with Dr. Iacobucci.  Overdue and urged to schedule.  Foot exam grossly normal (02/2022), and no evidence of albuminuria (4 mg/g). Emphasized importance of lifestyle modifications, including low carbohydrate diet, regular exercise, and weight loss.  Will reassess HbA1c in 3 months.  2. Acute lymphoblastic leukemia, +Philadelphia chromosone mutation, s/p stem cell transplant, in remission. Diagnosed in 02/2017 and completed induction chemotherapy followed by stem cell transplant in 08/2017.  Had been on maintenance therapy with a TKI (nilotinib), but discontinued in 12/2020 by her oncologist.  Had been followed closely by Dr. Randie Hake, transplant oncologist at [facility] in [place].  Reported frequency of follow-up had been decreased to annually but unable to keep appointment in 12/2022 due to insurance change.  Referred to Dr. Dorrie on 06/10/2023 and extensive lab evaluation performed.  Advised that considering bone marrow biopsy with MRD pending lab results but did not keep follow-up appointment with Dr. Dorrie on 09/16/2023 to review results.  Urged to reschedule..  3. Hypertension. Blood pressure remains well controlled on lisinopril  10 mg daily  today. Renal function mildly decreased today at 1.11/eGFR 56 consistent with chronic renal disease.  Worsening renal function likely related to uncontrolled diabetes.  On potassium and magnesium  supplements due to long term difficulty with deficiency and levels remain stable today. Will continue to monitor.  4. Hyperlipidemia.  On moderate intensity dose atorvastatin  with LDL 56 and HDL 40, indicative of excellent control.  Goal LDL <70 given diabetes mellitus. Emphasized importance of lifestyle modifications, including heart healthy diet, regular exercise, and weight loss. Continue to follow.  5. Peripheral neuropathy, painful. Secondary to chemotherapy used to treat ALL. Did not tolerate gabapentin due to hallucinations. On Cymbalta  30 mg bid and started on pregabalin  in 02/2021 with good effect.  Previously on pregabalin  150 mg twice daily but reported only taking nightly due to difficulty with sedation effects during the day.  Dose of pregabalin  increased to 200 mg nightly with better control.  Advised to continue using Flexeril  as needed for cramping.  Overall improved today.  6. Asthma, mild intermittent.  Noting significant improvement since initiation of Singulair  10 mg daily following exacerbation in 03/2021.  Now on Qvar for maintenance therapy and albuterol as needed with good control. Referred by Dr. Dorrie to establish care with Dr. Angelyn for PFT testing given her stem cell transplant, but did not keep scheduled appointment 08/22/2023.  Also recommended sleep evaluation due to ongoing fatigue, body habitus, and worsening restless leg symptoms.  Urged to reschedule appointment.  Will readdress at next visit.  7. GERD. On Protonix 40 mg bid with good control. Follow.  8.  Chronic renal disease, stage 2-3.  Noted intermittent elevation of creatinine since 05/2021 with recent baseline ranging 1.07-1.11/eGFR 56-59.  Worsening function likely related to hyperglycemia due to poorly controlled blood sugar.  On ACE  inhibitor and statin.  Will consider addition of SGLT2 inhibitor at next visit.  Stressed importance of blood sugar control in order to avoid further progression.  Advised to maintain good fluid intake and avoid NSAIDs.  Will continue to monitor.  9. Elevated alkaline phosphatase. GGT elevated at 97 so likely of hepatic origin (04/2020). AMA negative (04/2020) and alkaline phosphatase isozymes with normal differential and predominant hepatic origin (04/2020).  Right upper quadrant ultrasound ordered but patient never obtained. Repeat alkaline phosphatase again mildly elevated today at 125. Suspect improvement related to discontinuation of nilotinib. Will continue to monitor.  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  Reported increasing difficulty with vertigo and stated that she had experienced multiple falls. Continuing difficulty with disequilibrium.  Referred to balance therapy in 01/2021 and again in 05/2021 but never scheduled due to cost. Advised use of meclizine  as needed.  Reports overall improvement in symptoms today.  11. Insomnia. Using Ambien +/- Benadryl as needed. Emphasized good sleep hygiene.  Continue to monitor.  12. Eczema. On Xyzal and will use Kenalog as needed.   13. Abnormal mammogram. Patient reports had previous biopsy of right breast which was benign. Screening mammogram (02/2019) showed multiple abnormalities in right breast. Right breast diagnostic mammogram and ultrasound (05/12/2019) showed probably benign findings, but recommendation was to proceed with 6 month follow-up. Underwent repeat diagnostic right mammogram and right breast ultrasound in 10/2020 and findings felt to be benign. Recommended continuing with annual follow-up and due for bilateral screening in 02/2021.  Order again placed on 08/01/2023 for bilateral mammogram and right breast ultrasound and urged to schedule.  Patient with FH history of breast cancer in her mother.  14. Obesity. Weight increased nearly 20 pounds since 12/2019. On  tirzepatide  but does not appear to be helping with appetite suppression.  Will titrate dose as discussed to 10 mg weekly.  Emphasized importance of continuing with attempts lifestyle modifications, including heart healthy low carbohydrate diet, regular exercise, and weight loss.  15. Health maintenance. Completed 4 doses of the COVID 19 vaccine series due to immunosuppressed status and the bivalent Pfizer booster dose.  Completed Prevnar 20 and Shingrix vaccine series.  Discussed recommended vaccines, including influenza vaccine, updated COVID vaccine, and new RSV vaccine. Other immunizations up-to-date.  Bilateral mammogram due in 02/2021 and still has not yet scheduled.  Urged again today to schedule with baseline bone density study.  Colonoscopy completed in [place] on 05/14/2018.SABRA  Completed well woman exam with NP Allena Gleason and chart updated, but due for follow-up with NP Nicholaus.  Also completed eye exam with Dr. Iacobucci but now overdue for annual exam and discussed importance  to schedule as overdue.  Vitamin D level remains normal on maintenance dose supplement. Emphasized importance of lifestyle modifications, including heart healthy diet, regular exercise, and weight loss.     In addition, an annual Medicare wellness visit was done today.     Patient understands recommendations and agrees with plan.  Follow-up in 3 months.      This visit required high complexity medically necessary decision making and management plans.     Time spent in preparing for the visit, including review of history, tests done prior to arrival, additional time reviewing clinical data, imaging, outside records and test results:  5  minutes.  Time spent in counseling with patient and/or family members regarding care plan: 30 minutes.  Time spent on same-day documentation, ordering tests, treatments, and referring patient for further care: 15 minutes.   Time spent on visit does not include time for documentation not completed on  day of visit.

## 2023-11-11 NOTE — Progress Notes (Signed)
 Medicare Annual Wellness Visit    [name] Wu is here for Medicare AWV    Assessment & Plan   Type 2 diabetes mellitus with hyperglycemia, with long-term current use of insulin  (HCC)  -     Tirzepatide  (MOUNJARO ) 10 MG/0.5ML SOAJ pen; Inject 10 mg into the skin once a week Start after completing the 7.5mg  dose - MOUNJARO , Disp-6 mL, R-1Normal  -     Hemoglobin A1C; Future  Type 2 diabetes mellitus with stage 3a chronic kidney disease, with long-term current use of insulin  (HCC)  -     Comprehensive Metabolic Panel; Future  -     Hemoglobin A1C; Future  -     Albumin/Creatinine Ratio, Urine; Future  Type 2 diabetes mellitus with diabetic neuropathy, with long-term current use of insulin  (HCC)  -     Hemoglobin A1C; Future  Acute lymphoblastic leukemia, in remission (HCC)  -     CBC with Auto Differential; Future  Bone marrow transplant status (HCC)  Peripheral neuropathy due to chemotherapy  Stage 3a chronic kidney disease (HCC)  -     Comprehensive Metabolic Panel; Future  -     Magnesium ; Future  -     Albumin/Creatinine Ratio, Urine; Future  -     Urinalysis with Microscopic; Future  -     Vitamin D 25 Hydroxy; Future  Essential hypertension  Pure hypercholesterolemia  -     Lipid Panel; Future  Mild intermittent asthma without complication  Noncompliance  Sleep apnea, unspecified type  Class 1 obesity due to excess calories with serious comorbidity and body mass index (BMI) of 31.0 to 31.9 in adult  Medicare annual wellness visit, subsequent  ACP (advance care planning)       Return in about 3 months (around 02/11/2024), or if symptoms worsen or fail to improve.     Subjective   See office progress note for details.      Patient's complete Health Risk Assessment and screening values have been reviewed and are found in Flowsheets. The following problems were reviewed today and where indicated follow up appointments were made and/or referrals ordered.    Positive Risk Factor Screenings with  Interventions:    Fall Risk:  Do you feel unsteady or are you worried about falling? : (!) yes  2 or more falls in past year?: no  Fall with injury in past year?: no     Interventions:    Reviewed medications, home hazards, visual acuity, and co-morbidities that can increase risk for falls  Fall precautions stressed    Cognitive:   Clock Drawing Test (CDT): (!) Abnormal  Words recalled: 3 Words Recalled  Total Score: 3  Total Score Interpretation: Normal Mini-Cog  Interventions:  Patient declines any further evaluation or treatment            Inactivity:  On average, how many days per week do you engage in moderate to strenuous exercise (like a brisk walk)?: 0 days (!) Abnormal  On average, how many minutes do you engage in exercise at this level?: 0 min  Interventions:  Encouraged to increase activity as tolerated    Poor Eating Habits/Diet:  Do you eat balanced/healthy meals regularly?: (!) No  Interventions:  Stressed importance of a heart healthy low carbohydrate diet and need for better blood sugar control    Abnormal BMI (obese):  Body mass index is 31.47 kg/m. (!) Abnormal  Interventions:  On GLP-1 agonist therapy and stressed importance of dietary changes  and exercise.          Objective   Vitals:    11/11/23 1449 11/11/23 1512   BP: 137/88 122/80   Pulse: 87    Resp: 14    Temp: 98.1 F (36.7 C)    TempSrc: Temporal    SpO2: 94%    Weight: 93.9 kg (207 lb)    Height: 1.727 m (5' 8)       Body mass index is 31.47 kg/m.        See office progress note for details.              Allergies   Allergen Reactions    Penicillins Hives and Itching     Prior to Visit Medications    Medication Sig Taking? Authorizing Provider   Tirzepatide  (MOUNJARO ) 10 MG/0.5ML SOAJ pen Inject 10 mg into the skin once a week Start after completing the 7.5mg  dose - MOUNJARO  Yes Chet Heron [name], MD   insulin  lispro, 1 Unit Dial , (HUMALOG /ADMELOG ) 100 UNIT/ML SOPN Inject 5 Units into the skin 3 times daily (before meals) Plus  sliding scale provided Yes Chet Heron [name], MD   montelukast  (SINGULAIR ) 10 MG tablet TAKE 1 TABLET BY MOUTH DAILY TO CONTROL ASTHMA Yes Chet Heron [name], MD   atorvastatin  (LIPITOR) 20 MG tablet TAKE 1 TABLET BY MOUTH DAILY Yes Chet Heron [name], MD   lisinopril  (PRINIVIL ;ZESTRIL ) 10 MG tablet TAKE ONE TABLET BY MOUTH EVERY DAY Yes Chet Heron [name], MD   pregabalin  (LYRICA ) 200 MG capsule Take 1 capsule by mouth nightly for 180 days. Max Daily Amount: 200 mg Yes Chet Heron [name], MD   DULoxetine  (CYMBALTA ) 30 MG extended release capsule Take 1 capsule by mouth 2 times daily Yes Chet, Heron [name], MD   Continuous Glucose Sensor (FREESTYLE LIBRE 3 SENSOR) MISC USE TO MONITOR BLOOD GLUCOSE CONTINUOUSLY - CHANGE EVERY 14 DAYS Yes Chet Heron [name], MD   cyclobenzaprine  (FLEXERIL ) 10 MG tablet TAKE 1 TABLET BY MOUTH THREE TIMES A DAY AS NEEDED FOR MUSCLE SPASMS Yes Chet Heron [name], MD   meclizine  (ANTIVERT ) 25 MG tablet TAKE ONE TABLET BY MOUTH THREE TIMES A DAY AS NEEDED FOR DIZZINESS Yes Chet Heron [name], MD   KLOR-CON  M20 20 MEQ extended release tablet TAKE TWO TABLETS BY MOUTH EVERY DAY Yes Chet Heron [name], MD   Insulin  Degludec (TRESIBA  FLEXTOUCH) 200 UNIT/ML SOPN INJECT 70 UNITS INTO THE SKIN EVERY EVENING ADJUST AS DIRECTED. Yes Chet Heron [name], MD   magnesium  oxide (MAG-OX) 400 MG tablet Take 2 tablets by mouth 2 times daily Yes Chet Heron [name], MD   omeprazole  (PRILOSEC  OTC) 20 MG tablet Take 1 tablet by mouth daily Yes Chet Heron [name], MD   beclomethasone (QVAR REDIHALER) 80 MCG/ACT AERB inhaler Inhale 2 puffs into the lungs in the morning and 2 puffs in the evening. Yes Chet Heron [name], MD   Insulin  Pen Needle 32G X 4 MM MISC 1 each by Does not apply route daily Yes Hunte, Marcus CROME, MD   BIOTIN PO Take 1 tablet by mouth daily Yes Automatic Reconciliation, Ar   ZINC  PO Take by mouth Yes Automatic Reconciliation, Ar   albuterol sulfate HFA (PROVENTIL;VENTOLIN;PROAIR) 108 (90 Base) MCG/ACT  inhaler Inhale 1 puff into the lungs every 4 hours as needed Yes Automatic Reconciliation, Ar   albuterol (PROVENTIL) (2.5 MG/3ML) 0.083% nebulizer solution Inhale 3 mLs into the lungs every 4 hours as needed Yes Automatic Reconciliation, Ar   vitamin D3 (CHOLECALCIFEROL)  125 MCG (5000 UT) TABS tablet Take 1 tablet by mouth daily Yes Automatic Reconciliation, Ar   diphenhydrAMINE (BENADRYL) 25 MG capsule Take 1 capsule by mouth every 6 hours as needed Yes Automatic Reconciliation, Ar   Specialty Vitamins Products (MAGNESIUM , AMINO ACID CHELATE,) 133 MG tablet Take 1 tablet by mouth 2 times daily Yes Automatic Reconciliation, Ar   triamcinolone (KENALOG) 0.1 % cream APPLY TO AFFECTED AREA TWICE A DAY AS NEEDED FOR RASH Yes Automatic Reconciliation, Ar   zolpidem (AMBIEN) 5 MG tablet Take 1 tablet by mouth. Yes Automatic Reconciliation, Ar       CareTeam (Including outside providers/suppliers regularly involved in providing care):   Patient Care Team:  Chet Heron [name], MD as PCP - General  Chet, Heron [name], MD as PCP - Empaneled Provider  Gretel Allena CROME, APRN - NP as Nurse Practitioner  Lorenz Ozell [facility], [facility] as Pharmacist (Pharmacy)     Recommendations for Preventive Services Due: see orders and patient instructions/AVS.  Recommended screening schedule for the next 5-10 years is provided to the patient in written form: see Patient Instructions/AVS.     Reviewed and updated this visit:  Tobacco  Allergies  Meds  Sexual Hx      Sexual History: Patient declined to answer.

## 2023-11-11 NOTE — Patient Instructions (Addendum)
 Heart-Healthy Diet: Care Instructions  Your Care Instructions     A heart-healthy diet has lots of vegetables, fruits, nuts, beans, and whole grains, and is low in salt. It limits foods that are high in saturated fat, such as meats, cheeses, and fried foods. It may be hard to change your diet, but even small changes can lower your risk of heart attack and heart disease.  Follow-up care is a key part of your treatment and safety. Be sure to make and go to all appointments, and call your doctor if you are having problems. It's also a good idea to know your test results and keep a list of the medicines you take.  How can you care for yourself at home?  Watch your portions  Learn what a serving is. A serving and a portion are not always the same thing. Make sure that you are not eating larger portions than are recommended. For example, a serving of pasta is  cup. A serving size of meat is 2 to 3 ounces. A 3-ounce serving is about the size of a deck of cards. Measure serving sizes until you are good at eyeballing them. Keep in mind that restaurants often serve portions that are 2 or 3 times the size of one serving.  To keep your energy level up and keep you from feeling hungry, eat often but in smaller portions.  Eat only the number of calories you need to stay at a healthy weight. If you need to lose weight, eat fewer calories than your body burns (through exercise and other physical activity).  Eat more fruits and vegetables  Eat a variety of fruit and vegetables every day. Dark green, deep orange, red, or yellow fruits and vegetables are especially good for you. Examples include spinach, carrots, peaches, and berries.  Keep carrots, celery, and other veggies handy for snacks. Buy fruit that is in season and store it where you can see it so that you will be tempted to eat it.  Cook dishes that have a lot of veggies in them, such as stir-fries and soups.  Limit saturated and trans fat  Read food labels, and try  to avoid saturated and trans fats. They increase your risk of heart disease.  Use olive or canola oil when you cook.  Bake, broil, grill, or steam foods instead of frying them.  Choose lean meats instead of high-fat meats such as hot dogs and sausages. Cut off all visible fat when you prepare meat.  Eat fish, skinless poultry, and meat alternatives such as soy products instead of high-fat meats. Soy products, such as tofu, may be especially good for your heart.  Choose low-fat or fat-free milk and dairy products.  Eat foods high in fiber  Eat a variety of grain products every day. Include whole-grain foods that have lots of fiber and nutrients. Examples of whole-grain foods include oats, whole wheat bread, and brown rice.  Buy whole-grain breads and cereals, instead of white bread or pastries.  Limit salt and sodium  Limit how much salt and sodium you eat to help lower your blood pressure.  Taste food before you salt it. Add only a little salt when you think you need it. With time, your taste buds will adjust to less salt.  Eat fewer snack items, fast foods, and other high-salt, processed foods. Check food labels for the amount of sodium in packaged foods.  Choose low-sodium versions of canned goods (such as soups, vegetables, and beans).  Limit  sugar  Limit drinks and foods with added sugar. These include candy, desserts, and soda pop.  Limit alcohol  Limit alcohol to no more than 2 drinks a day for men and 1 drink a day for women. Too much alcohol can cause health problems.  When should you call for help?  Watch closely for changes in your health, and be sure to contact your doctor if:    You would like help planning heart-healthy meals.   Where can you learn more?  Go to [email or web address]  Enter V137 in the search box to learn more about Heart-Healthy Diet: Care Instructions.  Current as of: January 01, 2018               Content Version: 12.6   2006-2020 [employer].    Care instructions adapted under license by [employer] (which disclaims liability or warranty for this information). If you have questions about a medical condition or this instruction, always ask your healthcare professional. [employer], Inc[employer] disclaims any warranty or liability for your use of this information.    A Healthy Lifestyle: Care Instructions  A healthy lifestyle can help you feel good, have more energy, and stay at a weight that's healthy for you. You can share a healthy lifestyle with your friends and family. And you can do it on your own.    Eat meals with your friends or family. You could try cooking together.    Plan activities with other people. Go for a walk with a friend, try a free online fitness class, or join a sports league.    Eat a variety of healthy foods. These include fruits, vegetables, whole grains, low-fat dairy, and lean protein.    Choose healthy portions of food. You can use the Nutrition Facts label on food packages as a guide.    Eat more fruits and vegetables. You could add vegetables to sandwiches or add fruit to cereal.    Drink water when you are thirsty. Limit soda, juice, and sports drinks.    Try to exercise most days. Aim for at least 2 hours of exercise each week.    Keep moving. Work in the garden or take your dog on a walk. Use the stairs instead of the elevator.    If you use tobacco or nicotine, try to quit. Ask your doctor about programs and medicines to help you quit.    Limit alcohol. Men should have no more than 2 drinks a day. Women should have no more than 1. For some people, no alcohol is the best choice.  Follow-up care is a key part of your treatment and safety. Be sure to make and go to all appointments, and call your doctor if you are having problems. It's also a good idea to know your test results and keep a list of the medicines you take.  Where can you learn more?  Go to [email or web address] and enter U807 to learn  more about A Healthy Lifestyle: Care Instructions.  Current as of: December 16, 2021               Content Version: 14.0   2006-2024 [employer].   Care instructions adapted under license by [facility]. If you have questions about a medical condition or this instruction, always ask your healthcare professional. [employer] disclaims any warranty or liability for your use of this information.      Learning About Diabetes Food Guidelines  Your Care Instructions  Meal planning is important to manage diabetes. It helps keep your blood sugar at a target level (which you set with your doctor). You don't have to eat special foods. You can eat what your family eats, including sweets once in a while. But you do have to pay attention to how often you eat and how much you eat of certain foods.  You may want to work with a dietitian or a certified diabetes educator (CDE) to help you plan meals and snacks. A dietitian or CDE can also help you lose weight if that is one of your goals.  What should you know about eating carbs?  Managing the amount of carbohydrate (carbs) you eat is an important part of healthy meals when you have diabetes. Carbohydrate is found in many foods.  Learn which foods have carbs. And learn the amounts of carbs in different foods.  Bread, cereal, pasta, and rice have about 15 grams of carbs in a serving. A serving is 1 slice of bread (1 ounce),  cup of cooked cereal, or 1/3 cup of cooked pasta or rice.  Fruits have 15 grams of carbs in a serving. A serving is 1 small fresh fruit, such as an apple or orange;  of a banana;  cup of cooked or canned fruit;  cup of fruit juice; 1 cup of melon or raspberries; or 2 tablespoons of dried fruit.  Milk and no-sugar-added yogurt have 15 grams of carbs in a serving. A serving is 1 cup of milk or 2/3 cup of no-sugar-added yogurt.  Starchy vegetables have 15 grams of carbs in a serving. A serving is  cup of mashed potatoes or sweet  potato; 1 cup winter squash;  of a small baked potato;  cup of cooked beans; or  cup cooked corn or green peas.  Learn how much carbs to eat each day and at each meal. A dietitian or CDE can teach you how to keep track of the amount of carbs you eat. This is called carbohydrate counting.  If you are not sure how to count carbohydrate grams, use the Plate Method to plan meals. It is a good, quick way to make sure that you have a balanced meal. It also helps you spread carbs throughout the day.  Divide your plate by types of foods. Put non-starchy vegetables on half the plate, meat or other protein food on one-quarter of the plate, and a grain or starchy vegetable in the final quarter of the plate. To this you can add a small piece of fruit and 1 cup of milk or yogurt, depending on how many carbs you are supposed to eat at a meal.  Try to eat about the same amount of carbs at each meal. Do not save up your daily allowance of carbs to eat at one meal.  Proteins have very little or no carbs per serving. Examples of proteins are beef, chicken, [place], fish, eggs, tofu, cheese, cottage cheese, and peanut butter. A serving size of meat is 3 ounces, which is about the size of a deck of cards. Examples of meat substitute serving sizes (equal to 1 ounce of meat) are 1/4 cup of cottage cheese, 1 egg, 1 tablespoon of peanut butter, and  cup of tofu.  How can you eat out and still eat healthy?  Learn to estimate the serving sizes of foods that have carbohydrate. If you measure food at home, it will be easier to estimate the amount in a serving of restaurant food.  If the meal you order has too much carbohydrate (such as potatoes, corn, or baked beans), ask to have a low-carbohydrate food instead. Ask for a salad or green vegetables.  If you use insulin , check your blood sugar before and after eating out to help you plan how much to eat in the future.  If you eat more carbohydrate at a meal than you had planned, take a walk  or do other exercise. This will help lower your blood sugar.  What else should you know?  Limit saturated fat, such as the fat from meat and dairy products. This is a healthy choice because people who have diabetes are at higher risk of heart disease. So choose lean cuts of meat and nonfat or low-fat dairy products. Use olive or canola oil instead of butter or shortening when cooking.  Don't skip meals. Your blood sugar may drop too low if you skip meals and take insulin  or certain medicines for diabetes.  Check with your doctor before you drink alcohol. Alcohol can cause your blood sugar to drop too low. Alcohol can also cause a bad reaction if you take certain diabetes medicines.  Follow-up care is a key part of your treatment and safety. Be sure to make and go to all appointments, and call your doctor if you are having problems. It's also a good idea to know your test results and keep a list of the medicines you take.  Where can you learn more?  Go to [email or web address]  Enter I147 in the search box to learn more about Learning About Diabetes Food Guidelines.  Current as of: May 01, 2018               Content Version: 12.6   2006-2020 [employer], [employer].   Care instructions adapted under license by [employer] (which disclaims liability or warranty for this information). If you have questions about a medical condition or this instruction, always ask your healthcare professional. [employer], [employer] disclaims any warranty or liability for your use of this information.        High Cholesterol: Care Instructions  Your Care Instructions     Cholesterol is a type of fat in your blood. It is needed for many body functions, such as making new cells. Cholesterol is made by your body. It also comes from food you eat. High cholesterol means that you have too much of the fat in your blood. This raises your risk of a heart attack and stroke.  LDL and HDL are part of  your total cholesterol. LDL is the bad cholesterol. High LDL can raise your risk for heart disease, heart attack, and stroke. HDL is the good cholesterol. It helps clear bad cholesterol from the body. High HDL is linked with a lower risk of heart disease, heart attack, and stroke.  Your cholesterol levels help your doctor find out your risk for having a heart attack or stroke. You and your doctor can talk about whether you need to lower your risk and what treatment is best for you.  A heart-healthy lifestyle along with medicines can help lower your cholesterol and your risk. The way you choose to lower your risk will depend on how high your risk is for heart attack and stroke. It will also depend on how you feel about taking medicines.  Follow-up care is a key part of your treatment and safety. Be sure to make and go to all appointments, and call your doctor if you are having problems.  It's also a good idea to know your test results and keep a list of the medicines you take.  How can you care for yourself at home?  Eat a variety of foods every day. Good choices include fruits, vegetables, whole grains (like oatmeal), dried beans and peas, nuts and seeds, soy products (like tofu), and fat-free or low-fat dairy products.  Replace butter, margarine, and hydrogenated or partially hydrogenated oils with olive and canola oils. (Canola oil margarine without trans fat is fine.)  Replace red meat with fish, poultry, and soy protein (like tofu).  Limit processed and packaged foods like chips, crackers, and cookies.  Bake, broil, or steam foods. Don't fry them.  Be physically active. Get at least 30 minutes of exercise on most days of the week. Walking is a good choice. You also may want to do other activities, such as running, swimming, cycling, or playing tennis or team sports.  Stay at a healthy weight or lose weight by making the changes in eating and physical activity listed above. Losing just a small amount of  weight, even 5 to 10 pounds, can reduce your risk for having a heart attack or stroke.  Do not smoke.  When should you call for help?  Watch closely for changes in your health, and be sure to contact your doctor if:    You need help making lifestyle changes.     You have questions about your medicine.   Where can you learn more?  Go to [email or web address].[email or web address]  Enter [phone] in the search box to learn more about High Cholesterol: Care Instructions.  Current as of: April 27, 2018               Content Version: 12.6   2006-2020 [employer], [employer].   Care instructions adapted under license by [employer] (which disclaims liability or warranty for this information). If you have questions about a medical condition or this instruction, always ask your healthcare professional. [employer], [employer] disclaims any warranty or liability for your use of this information.   Medicare Wellness Visit, Female    The best way to improve and maintain good health is to have a healthy lifestyle by eating a well-balanced diet, exercising regularly, limiting alcohol and stopping smoking.    Regular visits with your physician or non-physician health care provider also support your good health. Preventive screening tests can find health problems before they become diseases or illnesses.     Preventive services such as immunizations prevent serious infections.    All people over age 62 should have a Pneumovax and a Prevnar-13 shot to prevent potentially life threatening infections with the pneumococcus bacteria, a common cause of pneumonia. These are once in a lifetime unless you and your provider decide differently.    All people over 65 should have a yearly influenza vaccine or flu shot. This does not prevent infection with cold viruses but has been proven to prevent hospitalization and death from influenza.    Although Medicare part B regular Medicare currently only covers tetanus  vaccination in the context of an injury, a tetanus vaccine (Tdap or Td) is recommended every 10 years.    A shingles vaccine is recommended once in a lifetime after age 30. The Shingles vaccine is also not covered by Medicare part B.    Note, however, that both the Shingles vaccine and Tdap/Td are generally covered by secondary carriers. Please check your coverage and out of pocket expenses. Consider contacting your local health department  because it may stock these vaccines for a reasonable charge.    We currently have documentation of the following immunization history for you:  Immunization History   Administered Date(s) Administered   . COVID-19, MODERNA BLUE border, Primary or Immunocompromised, (age 12y+), IM, 100 mcg/0.5mL 06/16/2019, 07/14/2019   . COVID-19, MODERNA Bivalent, (age 12y+), IM, 50 mcg/0.5 mL 09/13/2021   . COVID-19, MODERNA, 2024/25, (age 12y+), IM, 49mcg/0.5mL 02/18/2022, 07/25/2022   . COVID-19, PFIZER Bivalent, DO NOT Dilute, (age 12y+), IM, 30 mcg/0.3 mL 01/29/2021   . COVID-19, PFIZER PURPLE top, DILUTE for use, (age 55 y+), 22mcg/0.3mL 03/12/2020, 10/22/2020   . COVID-19, PFIZER, 2024/25, (age 12y+), IM, 62mcg/0.3mL 02/15/2023   . Influenza Trivalent 02/27/2018, 02/23/2019, 03/01/2020   . Influenza Virus Vaccine 02/18/2022   . Influenza, FLUARIX, FLULAVAL, FLUZONE (age 2 mo+) and AFLURIA, (age 22 y+), Quadv PF, 0.70mL 01/23/2021, 02/15/2023   . Pneumococcal, PCV20, PREVNAR 20, (age 6w+), IM, 0.5mL 01/23/2021   . Pneumococcal, PPSV23, PNEUMOVAX 23, (age 2y+), SC/IM, 0.4mL 03/18/2019   . TDaP, ADACEL (age 3y-64y), MYRTICE (age 10y+), IM, 0.11mL 03/18/2019   . Zoster Recombinant (Shingrix) 06/11/2021, 09/13/2021       Screening for infection with Hepatitis C is recommended for anyone born between [birth year] through 1965.The table at the bottom of this document indicates the status of this and other preventive services.    A bone mass density test (DEXA) to screen for osteoporosis or thinning of the  bones should be done at least once after age 8 and may be done up to every 2 years as determined by you and your health care provider. The most recent DEXA we have on file for you is:  DEXA Results (most recent):  @BSHSILASTIMGCAT ([id number])@    Screening for diabetes mellitus with a blood sugar test (glucose) should be done at least every 3 years until age 91. You and your health care provider may decide whether to continue screening after age 23. The most recent blood glucose we have on file for you is: No results found for: GLU, GLUCPOC      Glaucoma is a disease of the eye due to increased ocular pressure that can lead to blindness. People with risk factors for glaucoma (African American race, diabetes, family history) should be screened at least every 2 years by an eye professional.     Cardiovascular screening tests that check for elevated lipids or cholesterol (fatty part of blood) which can lead to heart disease and strokes should be done every 4-6 years through age 56. You and your health care provider may decide whether to continue screening after age 53. The most recent lipid panel we have on file for you is:   Lab Results   Component Value Date/Time    CHOL 129 11/10/2023 01:29 PM    HDL 40 11/10/2023 01:29 PM    LDL 56 11/10/2023 01:29 PM    LDL 82 06/25/2022 12:00 AM    VLDL 33 11/10/2023 01:29 PM       Colorectal cancer screening that evaluates for blood or polyps in your colon for people with average risk should be done yearly as a stool test, every five years as a flexible sigmoidoscope or every 10 years as a colonoscopy up to age 29. You and your health care provider may decide whether to continue screening after age 43.    Breast cancer screening with a mammogram is recommended at least once every 2 years  for women age 28-74. You and  your health care provider may decide whether to continue screening after age 33. The most recent mammogram we have on file for you is:   MAM Results (most  recent):  @BSHSILASTIMGCAT ([id number])@    Screening for cervical cancer with a pap smear is recommended for all women with a cervix until age 27. The frequency of this test is based on the details of her prior pap smear testing.You and your health care provider may decide whether to continue screening after age 63.    People who have smoked the equivalent of 1 pack per day for 30 years or more may benefit from screening for lung cancer with a yearly low dose CT scan until they have been non smokers for 15 years or competing health conditions render this unlikely to be beneficial. Our records show:n/a    Your Medicare Wellness Exam is recommended annually.    Here is a list of your current Health Maintenance items with a due date:  Health Maintenance   Topic Date Due   . HIV screen  Never done   . Respiratory Syncytial Virus (RSV) Pregnant or age 94 yrs+ (1 - Risk 60-74 years 1-dose series) Never done   . Diabetic retinal exam  10/20/2021   . Breast cancer screen  10/27/2021   . Diabetic foot exam  02/21/2023   . COVID-19 Vaccine (10 - Mixed Product risk 2024-25 season) 08/16/2023   . A1C test (Diabetic or Prediabetic)  10/31/2023   . Flu vaccine (1) 12/12/2023   . Diabetic Alb to Cr ratio (uACR) test  07/30/2024   . Depression Screen  07/31/2024   . Lipids  11/09/2024   . GFR test (Diabetes, CKD 3-4, OR last GFR 15-59)  11/09/2024   . Cervical cancer screen  02/20/2025   . Colorectal Cancer Screen  05/14/2028   . DTaP/Tdap/Td vaccine (2 - Td or Tdap) 03/17/2029   . Annual Wellness Visit [facility])  Completed   . Shingles vaccine  Completed   . Pneumococcal 50+ years Vaccine  Completed   . Hepatitis C screen  Completed   . Hepatitis A vaccine  Aged Out   . Hepatitis B vaccine  Aged Out   . Hib vaccine  Aged Out   . Polio vaccine  Aged Out   . Meningococcal (ACWY) vaccine  Aged Out   . Meningococcal B vaccine  Aged Out   . Pneumococcal 0-49 years Vaccine  Discontinued           Preventing Falls: Care  Instructions  Injuries and health problems such as trouble walking or poor eyesight can increase your risk of falling. So can some medicines. But there are things you can do to help prevent falls. You can exercise to get stronger. You can also arrange your home to make it safer.    Talk to your doctor about the medicines you take. Ask if any of them increase the risk of falls and whether they can be changed or stopped.   Try to exercise regularly. It can help improve your strength and balance. This can help lower your risk of falling.         Practice fall safety and prevention.   Wear low-heeled shoes that fit well and give your feet good support. Talk to your doctor if you have foot problems that make this hard.  Carry a cellphone or wear a medical alert device that you can use to call for help.  Use stepladders instead of chairs to  reach high objects. Don't climb if you're at risk for falls. Ask for help, if needed.  Wear the correct eyeglasses, if you need them.        Make your home safer.   Remove rugs, cords, clutter, and furniture from walkways.  Keep your house well lit. Use night-lights in hallways and bathrooms.  Install and use sturdy handrails on stairways.  Wear nonskid footwear, even inside. Don't walk barefoot or in socks without shoes.        Be safe outside.   Use handrails, curb cuts, and ramps whenever possible.  Keep your hands free by using a shoulder bag or backpack.  Try to walk in well-lit areas. Watch out for uneven ground, changes in pavement, and debris.  Be careful in the winter. Walk on the grass or gravel when sidewalks are slippery. Use de-icer on steps and walkways. Add non-slip devices to shoes.    Put grab bars and nonskid mats in your shower or tub and near the toilet. Try to use a shower chair or bath bench when bathing.   Get into a tub or shower by putting in your weaker leg first. Get out with your strong side first. Have a phone or medical alert device in the bathroom with  you.   Where can you learn more?  Go to [email or web address] and enter G117 to learn more about Preventing Falls: Care Instructions.  Current as of: December 11, 2022  Content Version: 14.5   [address] [place].   Care instructions adapted under license by [facility]. If you have questions about a medical condition or this instruction, always ask your healthcare professional. Romayne Alderman, [facility], disclaims any warranty or liability for your use of this information.         Learning About Mild Cognitive Impairment (MCI)  What is mild cognitive impairment (MCI)?     It's common to forget things sometimes as we get older. But some older people have memory loss that's more than normal aging. It's called mild cognitive impairment, or MCI. It is not the same as dementia.  People with the condition often know that their memory or mental function has changed. Tests may show some loss. But their minds work well overall. They can carry out daily tasks that are normal for them.  People with MCI have a higher chance of one day getting dementia. But not all people who have it will get dementia. Some people may stay the same over time.  What are the symptoms?  People with MCI have more memory loss than what occurs with normal aging. They may have increasing trouble with recalling words and keeping up with conversations. They may also have trouble remembering important events and making decisions.  What puts you at risk?  The risk of getting MCI increases with age. Having high blood pressure or having a family history of MCI may also increase your risk.  How is it diagnosed?  Your doctor will do a physical exam.  You may be asked questions to check your memory and other mental skills. Your doctor may also talk to close friends and family members. This can help the doctor figure out how your memory and other mental skills have changed.  You may get blood tests and tests that look at your  brain.  These questions and tests can make sure you don't have other conditions that can cause symptoms like MCI. These include depression, sleep problems, and side effects from medicines.  How is it treated?  There are no medicines to treat MCI or to keep it from progressing to dementia. But treating conditions like high blood pressure and diabetes may help. A person with MCI needs routine follow-up visits with their doctor to check on changes in the person's mental skills.  How can you care for yourself at home?  Keeping your body active can help slow MCI. Exercises like walking can help. Try to stay active mentally too. Read or do things like crossword puzzles if you enjoy doing them.  If you need help coping with MCI, you may want to get support from family, friends, a support group, or a counselor who works with people who have MCI.  Though the future isn't always clear, it can be good to plan ahead with instructions for your care. These are called advanced directives. Having a plan can help make sure that you get the care you want.  Current as of: April 15, 2023  Content Version: 14.5   [address], [place].   Care instructions adapted under license by [facility]. If you have questions about a medical condition or this instruction, always ask your healthcare professional. Romayne Alderman, [facility], disclaims any warranty or liability for your use of this information.         Eating Healthy Foods: Care Instructions  With every meal, you can make healthy food choices. Try to eat a variety of fruits, vegetables, whole grains, lean proteins, and low-fat dairy products. This can help you get the right balance of nutrients, including vitamins and minerals. Small changes add up over time. You can start by adding one healthy food to your meals each day.    Try to make half your plate fruits and vegetables, one-fourth whole grains, and one-fourth lean proteins. Try including dairy with your meals.   Eat  more fruits and vegetables. Try to have them with most meals and snacks.   Foods for healthy eating        Fruits   These can be fresh, frozen, canned, or dried.  Try to choose whole fruit rather than fruit juice.  Eat a variety of colors.        Vegetables   These can be fresh, frozen, canned, or dried.  Beans, peas, and lentils count too.        Whole grains   Choose whole-grain breads, cereals, and noodles.  Try brown rice.        Lean proteins   These can include lean meat, poultry, fish, and eggs.  You can also have tofu, beans, peas, lentils, nuts, and seeds.        Dairy   Try milk, yogurt, and cheese.  Choose low-fat or fat-free when you can.  If you need to, use lactose-free milk or fortified plant-based milk products, such as soy milk.        Water   Drink water when you're thirsty.  Limit sugar-sweetened drinks, including soda, fruit drinks, and sports drinks.  Where can you learn more?  Go to [employer] and enter T756 to learn more about Eating Healthy Foods: Care Instructions.  Current as of: February 17, 2023  Content Version: 14.5   [address], [place].   Care instructions adapted under license by [facility]. If you have questions about a medical condition or this instruction, always ask your healthcare professional. Romayne Alderman, [facility], disclaims any warranty or liability for your use of this information.  Starting a Weight-Loss Plan: Care Instructions  Overview    It can be a challenge to lose weight. But your doctor can help you make a weight-loss plan that meets your needs.  You don't have to make a lot of big changes at once. A better idea might be to focus on small changes and stick with them. When those changes become habit, you can add a few more changes.  Some people find it helpful to take an exercise or nutrition class. If you have questions, ask your doctor about seeing a registered dietitian or an exercise specialist. You might also  think about joining a weight-loss support group.  If you're not ready to make changes right now, try to pick a date in the future. Then make an appointment with your doctor to talk about when and how you'll get started with a plan.  Follow-up care is a key part of your treatment and safety. Be sure to make and go to all appointments, and call your doctor if you are having problems. It's also a good idea to know your test results and keep a list of the medicines you take.  How can you care for yourself as you start a weight-loss plan?   Set realistic goals. Many people expect to lose much more weight than is likely. A weight loss of 5% to 10% of your body weight may be enough to improve your health.  Get family and friends involved to provide support. Talk to them about why you are trying to lose weight, and ask them to help. They can help by participating in exercise and having meals with you, even if they may be eating something different.  Find what works best for you. If you do not have time or do not like to cook, a program that offers meal replacement bars or shakes may be better for you. Or if you like to prepare meals, finding a plan that includes daily menus and recipes may be best.  Ask your doctor about other health professionals who can help you achieve your weight-loss goals.  A dietitian can help you make healthy changes in your diet.  An exercise specialist or personal trainer can help you develop a safe and effective exercise program.  A counselor or psychiatrist can help you cope with issues such as depression, anxiety, or family problems that can make it hard to focus on weight loss.  Consider joining a support group for people who are trying to lose weight. Your doctor can suggest groups in your area.  Where can you learn more?  Go to [employer] and enter U357 to learn more about Starting a Weight-Loss Plan: Care Instructions.  Current as of: September 10, 2022  Content  Version: 14.5   [address] [place].   Care instructions adapted under license by [facility]. If you have questions about a medical condition or this instruction, always ask your healthcare professional. Romayne Alderman, [facility], disclaims any warranty or liability for your use of this information.         A Healthy Heart: Care Instructions  Overview     Coronary artery disease, also called heart disease, occurs when a substance called plaque builds up in the vessels that supply oxygen-rich blood to your heart muscle. This can narrow the blood vessels and reduce blood flow. A heart attack happens when blood flow is completely blocked. A high-fat diet, smoking, and other factors increase the risk of heart disease.  Your doctor has  found that you have a chance of having heart disease. A heart-healthy lifestyle can help keep your heart healthy and prevent heart disease. This lifestyle includes eating healthy, being active, staying at a weight that's healthy for you, and not smoking or using tobacco. It also includes taking medicines as directed, managing other health conditions, and trying to get a healthy amount of sleep.  Follow-up care is a key part of your treatment and safety. Be sure to make and go to all appointments, and call your doctor if you are having problems. It's also a good idea to know your test results and keep a list of the medicines you take.  How can you care for yourself at home?  Diet    Use less salt when you cook and eat. This helps lower your blood pressure. Taste food before salting. Add only a little salt when you think you need it. With time, your taste buds will adjust to less salt.     Eat fewer snack items, fast foods, canned soups, and other high-salt, high-fat, processed foods.     Read food labels and try to avoid saturated and trans fats. They increase your risk of heart disease by raising cholesterol levels.     Limit the amount of solid fat--butter, margarine, and  shortening--you eat. Use olive, peanut, or canola oil when you cook. Bake, broil, and steam foods instead of frying them.     Eat a variety of fruit and vegetables every day. Dark green, deep orange, red, or yellow fruits and vegetables are especially good for you. Examples include spinach, carrots, peaches, and berries.     Foods high in fiber can reduce your cholesterol and provide important vitamins and minerals. High-fiber foods include whole-grain cereals and breads, oatmeal, beans, brown rice, citrus fruits, and apples.     Eat lean proteins. Heart-healthy proteins include seafood, lean meats and poultry, eggs, beans, peas, nuts, seeds, and soy products.     Limit drinks and foods with added sugar. These include candy, desserts, and soda pop.   Heart-healthy lifestyle    If your doctor recommends it, get more exercise. For many people, walking is a good choice. Or you may want to swim, bike, or do other activities. Bit by bit, increase the time you're active every day. Try for at least 30 minutes on most days of the week.     Try to quit or cut back on using tobacco and other nicotine products. This includes smoking and vaping. If you need help quitting, talk to your doctor about stop-smoking programs and medicines. These can increase your chances of quitting for good. Quitting is one of the most important things you can do to protect your heart. It is never too late to quit. Try to avoid secondhand smoke too.     Stay at a weight that's healthy for you. Talk to your doctor if you need help losing weight.     Try to get 7 to 9 hours of sleep each night.     Limit alcohol to 2 drinks a day for men and 1 drink a day for women. Too much alcohol can cause health problems.     Manage other health problems such as diabetes, high blood pressure, and high cholesterol. If you think you may have a problem with alcohol or drug use, talk to your doctor.   Medicines    Take your medicines exactly as prescribed. Call your  doctor if you think you are  having a problem with your medicine.     If your doctor recommends aspirin, take the amount directed each day. Make sure you take aspirin and not another kind of pain reliever, such as acetaminophen (Tylenol).   When should you call for help?   Call 911 if you have symptoms of a heart attack. These may include:    Chest pain or pressure, or a strange feeling in the chest.     Sweating.     Shortness of breath.     Pain, pressure, or a strange feeling in the back, neck, jaw, or upper belly or in one or both shoulders or arms.     Lightheadedness or sudden weakness.     A fast or irregular heartbeat.   After you call 911, the operator may tell you to chew 1 adult-strength or 2 to 4 low-dose aspirin. Wait for an ambulance. Do not try to drive yourself.  Watch closely for changes in your health, and be sure to contact your doctor if you have any problems.  Where can you learn more?  Go to [employer] and enter F075 to learn more about A Healthy Heart: Care Instructions.  Current as of: December 11, 2022  Content Version: 14.5   [address] [place].   Care instructions adapted under license by [facility]. If you have questions about a medical condition or this instruction, always ask your healthcare professional. Romayne Alderman, [facility], disclaims any warranty or liability for your use of this information.    Personalized Preventive Plan for [name] Wu - 11/11/2023  Medicare offers a range of preventive health benefits. Some of the tests and screenings are paid in full while other may be subject to a deductible, co-insurance, and/or copay.  Some of these benefits include a comprehensive review of your medical history including lifestyle, illnesses that may run in your family, and various assessments and screenings as appropriate.  After reviewing your medical record and screening and assessments performed today your provider may have ordered  immunizations, labs, imaging, and/or referrals for you.  A list of these orders (if applicable) as well as your Preventive Care list are included within your After Visit Summary for your review.

## 2023-11-11 NOTE — Telephone Encounter (Signed)
 Pt called in regards of medication refill      Tirzepatide  (MOUNJARO ) 10 MG/0.5ML SOAJ pen   [id number] - [place], [place] - [address] - P [phone] - F [phone] [[id number]]

## 2023-11-11 NOTE — ACP (Advance Care Planning) (Signed)
 Advance Care Planning     Advance Care Planning (ACP) Physician/NP/PA Conversation    Date of Conversation: 11/11/2023  Conducted with: Patient with Decision Making Capacity    Healthcare Decision Maker:      Primary Decision Maker: Lamb,Thomas - Brother/Sister - [phone]    Primary Decision Maker: Katina Canterbury - Brother/Sister - [phone]    Secondary Decision Maker: Baruch Aran - Patient - [phone]    Click here to complete Healthcare Decision Makers including selection of the Healthcare Decision Maker Relationship (ie Primary)  Today we updated healthcare decision-makers naming her sister and brother as her primary decision-maker and her mother as her secondary decision-maker    Care Preferences:    Hospitalization:  If your health worsens and it becomes clear that your chance of recovery is unlikely, what would be your preference regarding hospitalization?  The patient would prefer hospitalization.    Ventilation:  If you were unable to breath on your own and your chance of recovery was unlikely, what would be your preference about the use of a ventilator (breathing machine) if it was available to you?  The patient would desire the use of a ventilator.    Resuscitation:  In the event your heart stopped as a result of an underlying serious health condition, would you want attempts made to restart your heart, or would you prefer a natural death?  Yes, attempt to resuscitate.    treatment goals, benefit/burden of treatment options, ventilation preferences, hospitalization preferences, resuscitation preferences, and end of life care preferences (vegetative state/imminent death)    Conversation Outcomes / Follow-Up Plan:  ACP in process - information provided, considering goals and options. Patient states that she would wish resuscitation efforts as long as meaningful recovery is considered possible and not deemed futile.    Reviewed DNR/DNI and patient elects Full Code (Attempt  Resuscitation)    Length of Voluntary ACP Conversation in minutes:  16 minutes    Heron CHRISTELLA Gentleman, MD

## 2023-11-11 NOTE — Telephone Encounter (Signed)
 PCP: Chet Heron [name], MD    LAST OFFICE VISIT: 08/01/2023    LAST REFILL PER CHART:  Medication:Tirzepatide  (MOUNJARO ) 10 MG/0.5ML SOAJ pen   Ordered On:09/22/2023  Instructions: Inject 10 mg into the skin once a week Start after completing the 7.5mg  dose   Dispense:75mL  Refills:0    Future Appointments   Date Time Provider Department Center   11/11/2023  2:30 PM Chet Heron [name], MD [facility] ECC DEP

## 2023-11-12 ENCOUNTER — Encounter

## 2023-11-13 LAB — HEMOGLOBIN A1C
Estimated Avg Glucose: 223 mg/dL
Hemoglobin A1C: 9.4 %{Hb} — ABNORMAL HIGH

## 2023-11-13 LAB — ALBUMIN/CREATININE RATIO, URINE
Albumin Urine: 3.8 ug/mL
Albumin/Creatinine Ratio: 4 mg/g{creat} (ref 0–29)
Creatinine, Ur: 106.5 mg/dL

## 2023-11-18 NOTE — Telephone Encounter (Signed)
 Hemoglobin A1C   Date Value Ref Range Status   11/10/2023 9.4 (H) %Hb Final     Comment:     Reference Range:  American Diabetes Association (ADA) Guidelines:  <5.7: Decreased risk for diabetes  5.7 - 6.4: Increased risk for diabetes  >6.4: Ongoing Hyperglycemia of any cause  <7.0: Glycemic control for adults with diabetes       Please let the patient know that her HbA1c remains elevated at 9.4.  Please advise her to begin titrating her dose of Tresiba  by 2 unit intervals in order to attempt to achieve a fasting blood glucose in the morning less than 130.  Further adjustments can be made by Pharm.D. Dail at her appointment on 12/17/2023 and please stress the importance that she keep that appointment.

## 2023-11-19 NOTE — Other (Signed)
 Attempted to call patient, no answer, left voicemail to return call

## 2023-11-20 NOTE — Other (Signed)
 Attempted to call patient, no answer, left voicemail to return call

## 2023-11-21 NOTE — Telephone Encounter (Signed)
 Left message for patient to call the office

## 2023-12-12 ENCOUNTER — Encounter

## 2023-12-12 MED ORDER — PREGABALIN 200 MG PO CAPS
200 | ORAL_CAPSULE | Freq: Every evening | ORAL | 1 refills | 30.00000 days | Status: DC
Start: 2023-12-12 — End: 2024-02-02

## 2023-12-12 MED ORDER — TRESIBA FLEXTOUCH 200 UNIT/ML SC SOPN
200 | Freq: Every evening | SUBCUTANEOUS | 3 refills | 75.00000 days | Status: DC
Start: 2023-12-12 — End: 2024-01-04

## 2023-12-12 MED ORDER — CYCLOBENZAPRINE HCL 10 MG PO TABS
10 | ORAL_TABLET | ORAL | 1 refills | 10.00000 days | Status: AC
Start: 2023-12-12 — End: ?

## 2023-12-12 MED ORDER — POTASSIUM CHLORIDE CRYS ER 20 MEQ PO TBCR
20 | ORAL_TABLET | Freq: Every day | ORAL | 3 refills | 50.00000 days | Status: AC
Start: 2023-12-12 — End: ?

## 2023-12-12 NOTE — Telephone Encounter (Signed)
 PCP: Chet Heron [name], MD    LAST OFFICE VISIT: 11/11/2023    [place]  The last fill date was 08/02/2023 for a 90 d/s qty 90      LAST REFILL PER CHART:  Medication:pregabalin  (LYRICA ) 200 MG capsule   Ordered On:08/01/2023  Instructions:Take 1 capsule by mouth nightly for 180 days. Max Daily Amount: 200 mg   Dispense:90 capsules  Refills:1    LAST REFILL PER CHART:  Medication:cyclobenzaprine  (FLEXERIL ) 10 MG tablet   Ordered On:06/10/2023  Instructions:TAKE 1 TABLET BY MOUTH THREE TIMES A DAY AS NEEDED FOR MUSCLE SPASMS   Dispense:90 tablets  Refills:1    LAST REFILL PER CHART:  Medication:Insulin  Degludec (TRESIBA  FLEXTOUCH) 200 UNIT/ML SOPN   Ordered On:04/06/2023  Instructions:INJECT 70 UNITS INTO THE SKIN EVERY EVENING ADJUST AS DIRECTED., Disp-9 Adjustable Dose Pre-filled Pen Syringe   Dispense:9  Refills:3    LAST REFILL PER CHART:  Medication:KLOR-CON  M20 20 MEQ extended release tablet   Ordered On:06/10/2023  Instructions:TAKE TWO TABLETS BY MOUTH EVERY DAY   Dispense:180 tablets  Refills:3    Future Appointments   Date Time Provider Department Center   12/17/2023  1:30 PM Lorenz Ozell B[facility] ECC DEP   02/11/2024  1:00 PM [facility] LAB VISIT [facility] ECC DEP   02/18/2024  2:00 PM Chet Heron [name], MD [facility] ECC DEP

## 2023-12-12 NOTE — Telephone Encounter (Signed)
 Attempted to call patient, no answer, left voicemail to return call

## 2023-12-12 NOTE — Telephone Encounter (Signed)
 Patient called in requesting the PCP to send over a script for eczema due to her having a real bad outbreak everywhere. Patient is complaining of itchiness all over her body.

## 2023-12-12 NOTE — Telephone Encounter (Signed)
 Please inquire if she is still taking montelukast  (Singulair ) and also is on an antihistamine such as Zyrtec (cetirizine).  Does she actually have an eczematous rash?  Does she feel that topical steroids would be appropriate or as she asking for oral prednisone ?

## 2023-12-15 MED ORDER — PREDNISONE 20 MG PO TABS
20 | ORAL_TABLET | Freq: Every day | ORAL | 0 refills | 5.00000 days | Status: AC
Start: 2023-12-15 — End: 2023-12-20

## 2023-12-15 MED ORDER — TRIAMCINOLONE ACETONIDE 0.1 % EX CREA
0.1 | CUTANEOUS | 0 refills | 22.00000 days | Status: DC
Start: 2023-12-15 — End: 2024-02-04

## 2023-12-15 NOTE — Addendum Note (Signed)
 Addended by: CHET HERON [name] on: 12/15/2023 09:41 AM     Modules accepted: Orders

## 2023-12-15 NOTE — Telephone Encounter (Signed)
Attempted to call patient, no answer, left voicemail with information.

## 2023-12-15 NOTE — Telephone Encounter (Signed)
 Prescriptions for prednisone  and triamcinolone  cream [facility]  Please let her know

## 2023-12-15 NOTE — Telephone Encounter (Signed)
 Called patient, she states that it is not actually a rash, that she was outside and received several mosquito bites and she now itches all over, no other symptoms. She states this happens almost every summer and the only thing that helps is oral prednisone  with a topical steroid. She is still taking Singulair  but stopped Zyrtec claiming no benefit.

## 2023-12-17 ENCOUNTER — Encounter: Payer: Medicare (Managed Care) | Primary: Internal Medicine

## 2023-12-17 NOTE — Progress Notes (Unsigned)
 Pharmacy Progress Note - Diabetes Management       Assessment / Plan:   Diabetes Management:  Per ADA guidelines, [name] A1c {IS/IS NOT:19932} at goal of < 7%.  ***    Recommendations to Chet Heron [name], MD:    - ***     Nutrition/Lifestyle Modifications:  - Educated pt on the importance of moderating carbohydrate intake. Reviewed sources of carbohydrates and method to help determine appropriate portion sizes (e.g., Diabetes Plate Method).  - Advised patient to avoid sugar-sweetened beverages and replace with water or diet/zero sugar option.  - Recommend ~30 minutes consistent, moderately intensive, exercise/day or ~150 minutes/week. Start small, stay consistent, and increase length and types of exercise, as tolerated.       Patient will return to clinic in *** week(s) for follow up.        S/O: [name] Wu, a 63 y.o. 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  Pt was seen today for diabetes management.  Patient's last A1c was:   Hemoglobin A1C   Date Value Ref Range Status   11/10/2023 9.4 (H) %Hb Final     Comment:     Reference Range:  American Diabetes Association (ADA) Guidelines:  <5.7: Decreased risk for diabetes  5.7 - 6.4: Increased risk for diabetes  >6.4: Ongoing Hyperglycemia of any cause  <7.0: Glycemic control for adults with diabetes          Interim update: Pt was last seen by me on 09/08/2023.  Per my prior note: [name] A1c is not at goal of < 7%.  [name] most recent Libre 3 AGP report for 14 days (ending 4/13) shows time in target range 16%, average glucose 275 mg/dL, and GMI 0.0%.  Poor adherence to her Humalog  dosing has led to poor prandial control.  Her basal  control is very poor, but will focus on the prandial control at this time.  May need to increase her dose of Tresiba  at the next visit.  Will continue her dose titration of Mounjaro  to 7.5mg  weekly x4 weeks, then increase to 10mg  weekly.  Will have her restart use of her Herlene 3 so that her CGM data can be assessed at follow up in 5 weeks.      Pt was lost to follow up.          Today:   ***    Current anti-hyperglycemic regimen includes:    Key Antihyperglycemic Medications              Insulin  Degludec (TRESIBA  FLEXTOUCH) 200 UNIT/ML SOPN Inject 70 Units into the skin every evening Adjust as directed.    Tirzepatide  (MOUNJARO ) 10 MG/0.5ML SOAJ pen Inject 10 mg into the skin once a week Start after completing the 7.5mg  dose - MOUNJARO     insulin  lispro, 1 Unit Dial , (HUMALOG /ADMELOG ) 100 UNIT/ML SOPN Inject 5 Units into the skin 3 times daily (before meals) Plus sliding scale provided          Complete current medication regimen includes:  Current Outpatient Medications   Medication Sig    predniSONE  (DELTASONE ) 20 MG tablet Take 1 tablet by mouth daily for 5 days    triamcinolone  (KENALOG ) 0.1 % cream Apply topically 2 times daily.  Insulin  Degludec (TRESIBA  FLEXTOUCH) 200 UNIT/ML SOPN Inject 70 Units into the skin every evening Adjust as directed.    cyclobenzaprine  (FLEXERIL ) 10 MG tablet TAKE 1 TABLET BY MOUTH THREE TIMES A DAY AS NEEDED FOR MUSCLE SPASMS    potassium chloride  (KLOR-CON  M20) 20 MEQ extended release tablet Take 2 tablets by mouth daily    pregabalin  (LYRICA ) 200 MG capsule Take 1 capsule by mouth nightly for 180 days. Max Daily Amount: 200 mg    Tirzepatide  (MOUNJARO ) 10 MG/0.5ML SOAJ pen Inject 10 mg into the skin once a week Start after completing the 7.5mg  dose - MOUNJARO     insulin  lispro, 1 Unit Dial , (HUMALOG /ADMELOG ) 100 UNIT/ML SOPN Inject 5 Units into the skin 3 times daily (before meals) Plus sliding scale provided    montelukast  (SINGULAIR ) 10 MG tablet TAKE 1 TABLET BY MOUTH DAILY  TO CONTROL ASTHMA    atorvastatin  (LIPITOR) 20 MG tablet TAKE 1 TABLET BY MOUTH DAILY    lisinopril  (PRINIVIL ;ZESTRIL ) 10 MG tablet TAKE ONE TABLET BY MOUTH EVERY DAY    DULoxetine  (CYMBALTA ) 30 MG extended release capsule Take 1 capsule by mouth 2 times daily    Continuous Glucose Sensor (FREESTYLE LIBRE 3 SENSOR) MISC USE TO MONITOR BLOOD GLUCOSE CONTINUOUSLY - CHANGE EVERY 14 DAYS    meclizine  (ANTIVERT ) 25 MG tablet TAKE ONE TABLET BY MOUTH THREE TIMES A DAY AS NEEDED FOR DIZZINESS    magnesium  oxide (MAG-OX) 400 MG tablet Take 2 tablets by mouth 2 times daily    omeprazole  (PRILOSEC  OTC) 20 MG tablet Take 1 tablet by mouth daily    beclomethasone (QVAR REDIHALER) 80 MCG/ACT AERB inhaler Inhale 2 puffs into the lungs in the morning and 2 puffs in the evening.    Insulin  Pen Needle 32G X 4 MM MISC 1 each by Does not apply route daily    BIOTIN PO Take 1 tablet by mouth daily    ZINC  PO Take by mouth    albuterol sulfate HFA (PROVENTIL;VENTOLIN;PROAIR) 108 (90 Base) MCG/ACT inhaler Inhale 1 puff into the lungs every 4 hours as needed    albuterol (PROVENTIL) (2.5 MG/3ML) 0.083% nebulizer solution Inhale 3 mLs into the lungs every 4 hours as needed    vitamin D3 (CHOLECALCIFEROL) 125 MCG (5000 UT) TABS tablet Take 1 tablet by mouth daily    diphenhydrAMINE (BENADRYL) 25 MG capsule Take 1 capsule by mouth every 6 hours as needed    Specialty Vitamins Products (MAGNESIUM , AMINO ACID CHELATE,) 133 MG tablet Take 1 tablet by mouth 2 times daily    zolpidem (AMBIEN) 5 MG tablet Take 1 tablet by mouth.     No current facility-administered medications for this visit.     Allergies:  Allergies   Allergen Reactions    Penicillins Hives and Itching       Blood Glucose Monitoring (BGM) or CGM:  - Had access to home glucometer/blood glucose log/CGM reader today:  {YES/NO:19726}  - Conducts/scans: ***   - Fasting BG today: ***  - Post-prandial:   -FBG: ***   -ac lunch: ***    -ac dinner: ***   -hs: ***    ROS:  Today, Pt  endorses:  - {Symptoms of Hyperglycemia:17903:::1}  - {Symptoms of Hypoglycemia:17902:::1}    Lifestyle modification(s):  -     Medication Adherence/Access:  - Endorses adherence to current regimen?: {YES/NO:19726}    Vitals/Labs:  No results found for: HBA1C  Hemoglobin A1C   Date Value Ref Range Status   11/10/2023  9.4 (H) %Hb Final     Comment:     Reference Range:  American Diabetes Association (ADA) Guidelines:  <5.7: Decreased risk for diabetes  5.7 - 6.4: Increased risk for diabetes  >6.4: Ongoing Hyperglycemia of any cause  <7.0: Glycemic control for adults with diabetes     07/31/2023 9.2 (H) 4.8 - 5.6 % Final     Comment:                 Prediabetes: 5.7 - 6.4           Diabetes: >6.4           Glycemic control for adults with diabetes: <7.0     03/20/2023 9.2 (H) 4.8 - 5.6 % Final     Comment:                 Prediabetes: 5.7 - 6.4           Diabetes: >6.4           Glycemic control for adults with diabetes: <7.0         Screenings/Prevention Parameters:  -Diabetic Eye and Foot Exams:      Diabetes Management   Topic Date Due    Diabetic retinal exam  10/20/2021    Diabetic foot exam  02/21/2023     -Microalbumin / Creatinine ratio:     No results found for: MACKEY ACE     Lab Results   Component Value Date    ALBCREAT 4 11/12/2023      No components found for: MALBCREARAT  -Immunizations:      Immunization History   Administered Date(s) Administered    COVID-19, MODERNA BLUE border, Primary or Immunocompromised, (age 12y+), IM, 100 mcg/0.96mL 06/16/2019, 07/14/2019    COVID-19, MODERNA Bivalent, (age 12y+), IM, 50 mcg/0.5 mL 09/13/2021    COVID-19, MODERNA, 2024/25, (age 12y+), IM, 32mcg/0.5mL 02/18/2022, 07/25/2022    COVID-19, PFIZER Bivalent, DO NOT Dilute, (age 12y+), IM, 30 mcg/0.3 mL 01/29/2021    COVID-19, PFIZER PURPLE top, DILUTE for use, (age 72 y+), 55mcg/0.3mL 03/12/2020, 10/22/2020    COVID-19, PFIZER, 2024/25, (age 12y+), IM, 30mcg/0.3mL 02/15/2023    Influenza Trivalent  02/27/2018, 02/23/2019, 03/01/2020    Influenza Virus Vaccine 02/18/2022    Influenza, FLUARIX, FLULAVAL, FLUZONE (age 52 mo+) and AFLURIA, (age 28 y+), Quadv PF, 0.72mL 01/23/2021, 02/15/2023    Pneumococcal, PCV20, PREVNAR 20, (age 6w+), IM, 0.75mL 01/23/2021    Pneumococcal, PPSV23, PNEUMOVAX 23, (age 2y+), SC/IM, 0.79mL 03/18/2019    TDaP, ADACEL (age 63y-64y), BOOSTRIX (age 10y+), IM, 0.65mL 03/18/2019    Zoster Recombinant (Shingrix) 06/11/2021, 09/13/2021       Additional Laboratory Parameters of Interest:   Estimation of renal function:  Lab Results   Component Value Date/Time    LABGLOM 56 11/10/2023 01:29 PM    LABGLOM 59 07/31/2023 12:00 AM    LABGLOM 74 03/20/2023 12:00 AM    LABGLOM 66 06/25/2022 12:00 AM    LABGLOM >60 05/08/2022 01:28 PM    LABGLOM >60 01/21/2022 03:21 PM    LABGLOM 57 05/29/2021 02:59 PM    GFRAA >60 01/12/2021 11:49 AM    GFRAA >60 11/22/2020 11:12 AM    GFRAA >60 08/10/2020 03:38 PM     Wt Readings from Last 3 Encounters:   11/11/23 93.9 kg (207 lb)   03/20/23 95.7 kg (211 lb)   06/26/22 94.3 kg (208 lb)     Ht Readings from Last 1 Encounters:   11/11/23  1.727 m (5' 8)     Calculated estimated creatinine clearance: CrCl cannot be calculated (Unknown ideal weight.).    Vital Signs Today:    There were no vitals taken for this visit.    There are no discontinued medications.    No orders of the defined types were placed in this encounter.      Future Appointments   Date Time Provider Department Center   12/17/2023  1:30 PM Lorenz Ozell BIR[facility] E[facility] DEP   02/11/2024  1:00 PM IOC LAB VISIT [facility] E[facility] DEP   02/18/2024  2:00 PM Sarris, Heron [name], MD [facility] E[facility] DEP       Patient verbalized understanding of the information presented and all of the patient's questions were answered.  AVS was handed to the patient. Patient advised to call the office with any additional questions or concerns.    Notifications of recommendations will be sent to Chet Heron [name], MD for  review.      Thank you for the consult,  Ozell Lorenz, PharmD, BCACP, BC-ADM        {[facility] [employer]

## 2024-01-01 NOTE — Telephone Encounter (Signed)
 Please refuse.  Sent message to patient.

## 2024-01-04 ENCOUNTER — Encounter

## 2024-01-05 MED ORDER — TRESIBA FLEXTOUCH 200 UNIT/ML SC SOPN
200 | Freq: Two times a day (BID) | SUBCUTANEOUS | 1 refills | 30.00000 days | Status: DC
Start: 2024-01-05 — End: 2024-01-09

## 2024-01-05 NOTE — Telephone Encounter (Signed)
 Mychart message sent to patient.

## 2024-01-05 NOTE — Telephone Encounter (Signed)
 Please clarify her dose of Tresiba .  This was sent to [facility] pharmacy on 12/12/2023 for the 70 units daily dose.  Please find out what the issue is.

## 2024-01-05 NOTE — Telephone Encounter (Signed)
 Please advise that the new prescription for Tresiba  was sent to mail-order.    Please find out if she would like a small supply sent to [facility] until her mail order arrives.  Also, given that she is taking such a high dose of insulin , she should divide it and take 65 units twice daily.  Please let her know.    There are several other prescriptions sent on 12/12/2023 to mail-order (please see refill encounter on that date).  Please see if they received those.

## 2024-01-05 NOTE — Telephone Encounter (Signed)
 PCP: Chet Heron [name], MD    LAST OFFICE VISIT: 11/11/2023    LAST REFILL PER CHART:  Medication:Insulin  Degludec (TRESIBA  FLEXTOUCH) 200 UNIT/ML SOPN   Ordered On:12/12/2023  Instructions:Inject 70 Units into the skin every evening Adjust as directed   Dispense:9 pens  Refills:3    Future Appointments   Date Time Provider Department Center   02/11/2024  1:00 PM IOC LAB VISIT [facility] E[facility] DEP   02/18/2024  2:00 PM Chet Heron [name], MD [facility] ECC DEP

## 2024-01-05 NOTE — Telephone Encounter (Signed)
 Called patient, she is currently taking 130 units every evening she states per her PCP's request. She called [facility], they never received 12/12/2023 prescription.

## 2024-01-07 ENCOUNTER — Encounter

## 2024-01-07 MED ORDER — HUMALOG KWIKPEN 100 UNIT/ML SC SOPN
100 | SUBCUTANEOUS | 4 refills | 62.00000 days | Status: DC
Start: 2024-01-07 — End: 2024-03-17

## 2024-01-07 MED ORDER — MECLIZINE HCL 25 MG PO TABS
25 | ORAL_TABLET | ORAL | 1 refills | Status: AC
Start: 2024-01-07 — End: ?

## 2024-01-07 NOTE — Telephone Encounter (Signed)
 PCP: Chet Heron [name], MD    LAST OFFICE VISIT:11/11/2023    LAST REFILL PER CHART:  Medication:insulin  lispro, 1 Unit Dial , (HUMALOG /ADMELOG ) 100 UNIT/ML SOPN   Ordered On:09/08/2023  Instructions:Inject 5 Units into the skin 3 times daily (before meals) Plus sliding scale provided   Dispense:65mL  Refills:5    Future Appointments   Date Time Provider Department Center   02/11/2024  1:00 PM IOC LAB VISIT [facility] DEP   02/18/2024  2:00 PM Chet Heron [name], MD [facility] ECC DEP

## 2024-01-07 NOTE — Telephone Encounter (Signed)
 PCP: Chet Heron [name], MD    LAST OFFICE VISIT: 11/11/2023    LAST REFILL PER CHART:  Medication:meclizine  (ANTIVERT ) 25 MG tablet   Ordered On:06/10/2023  Instructions:TAKE ONE TABLET BY MOUTH THREE TIMES A DAY AS NEEDED FOR DIZZINESS   Dispense:90 tablets  Refills:1    Future Appointments   Date Time Provider Department Center   02/11/2024  1:00 PM IOC LAB VISIT [facility] DEP   02/18/2024  2:00 PM Chet Heron [name], MD [facility] ECC DEP

## 2024-01-09 ENCOUNTER — Telehealth

## 2024-01-09 MED ORDER — TRESIBA FLEXTOUCH 200 UNIT/ML SC SOPN
200 | Freq: Two times a day (BID) | SUBCUTANEOUS | 1 refills | 53.00000 days | Status: DC
Start: 2024-01-09 — End: 2024-02-02

## 2024-01-09 NOTE — Telephone Encounter (Signed)
 [facility] called stating TRESIBA  pen only injects at 2 unit intervals. Script was for 65 units BID, needs changed to 64 or 66 units.

## 2024-01-09 NOTE — Addendum Note (Signed)
 Addended by: CHET HERON [name] on: 01/09/2024 11:40 AM     Modules accepted: Orders

## 2024-01-09 NOTE — Telephone Encounter (Signed)
 New prescription for Tresiba  sent to [facility]

## 2024-02-01 ENCOUNTER — Encounter

## 2024-02-02 MED ORDER — TRESIBA FLEXTOUCH 200 UNIT/ML SC SOPN
200 | SUBCUTANEOUS | 5 refills | 53.00000 days | Status: DC
Start: 2024-02-02 — End: 2024-02-18

## 2024-02-02 MED ORDER — PREGABALIN 200 MG PO CAPS
200 | ORAL_CAPSULE | Freq: Two times a day (BID) | ORAL | 1 refills | 30.00000 days | Status: AC
Start: 2024-02-02 — End: 2024-07-31

## 2024-02-02 MED ORDER — ZOLPIDEM TARTRATE 5 MG PO TABS
5 | ORAL_TABLET | Freq: Every evening | ORAL | 0 refills | 30.00000 days | Status: DC | PRN
Start: 2024-02-02 — End: 2024-04-19

## 2024-02-02 NOTE — Telephone Encounter (Signed)
 Patient returned call, stated she never received medication. Called pharmacy, medication needs additional insurance approved. PA initiated, awaiting response.

## 2024-02-02 NOTE — Telephone Encounter (Signed)
 Pres[facility]  Also sent [facility]ill for Ambien  to [facility].    Sent prescription for pregabalin  200 mg twice daily to [employer] order.  PMP reviewed.

## 2024-02-02 NOTE — Telephone Encounter (Signed)
 Attempted to call patient, no answer, left voicemail to return call

## 2024-02-02 NOTE — Telephone Encounter (Signed)
 PCP: Chet Heron [name], MD    LAST OFFICE VISIT: 11/11/2023    LAST REFILL PER CHART:  Medication:Insulin  Degludec (TRESIBA  FLEXTOUCH) 200 UNIT/ML SOPN   Ordered On:01/09/2024  Instructions:Inject 64 Units into the skin 2 times daily Adjust as directed   Dispense:57.6mL  Refills:1    Future Appointments   Date Time Provider Department Center   02/11/2024  1:00 PM IOC LAB VISIT [facility] DEP   02/18/2024  2:00 PM Chet Heron [name], MD [facility] ECC DEP

## 2024-02-02 NOTE — Telephone Encounter (Signed)
 PA approved.

## 2024-02-02 NOTE — Telephone Encounter (Signed)
 Called patient, gave information.

## 2024-02-02 NOTE — Telephone Encounter (Signed)
 This was sent in to her mail order on 01/09/2024.  Please see if she is having trouble getting it and needs [facility].

## 2024-02-02 NOTE — Telephone Encounter (Addendum)
 Called patient, gave information. She also stated that she would like to increase her dose of pregabalin  now that she is not working anymore, dose was reduced due to drowsiness while working, she states her pain is worsening and would like to increase to BID dosage. She also C/O issues sleeping and would like to be prescribed Ambien . Please advise.

## 2024-02-04 MED ORDER — TRIAMCINOLONE ACETONIDE 0.1 % EX CREA
0.1 | Freq: Two times a day (BID) | CUTANEOUS | 2 refills | Status: AC
Start: 2024-02-04 — End: ?

## 2024-02-04 NOTE — Telephone Encounter (Signed)
 PCP: Chet Heron [name], MD    LAST OFFICE VISIT: 11/11/2023    LAST REFILL PER CHART:  Medication:triamcinolone  (KENALOG ) 0.1 % cream   Ordered On:12/15/2023  Instructions:Apply topically 2 times daily   Dispense:30 g  Refills:0    Future Appointments   Date Time Provider Department Center   02/11/2024  1:00 PM IOC LAB VISIT [facility] DEP   02/18/2024  2:00 PM Chet Heron [name], MD [facility] ECC DEP

## 2024-02-11 ENCOUNTER — Encounter: Payer: Medicare (Managed Care) | Primary: Internal Medicine

## 2024-02-13 ENCOUNTER — Encounter: Payer: Medicare (Managed Care) | Primary: Internal Medicine

## 2024-02-16 ENCOUNTER — Encounter: Payer: Medicare (Managed Care) | Primary: Internal Medicine

## 2024-02-16 ENCOUNTER — Inpatient Hospital Stay: Admit: 2024-02-16 | Payer: Medicare (Managed Care) | Primary: Internal Medicine

## 2024-02-16 LAB — LABCORP SPECIMEN COLLECTION

## 2024-02-17 LAB — VITAMIN D 25 HYDROXY: Vit D, 25-Hydroxy: 38.2 ng/mL (ref 30.0–100.0)

## 2024-02-17 LAB — CBC WITH AUTO DIFFERENTIAL
Basophils %: 0 %
Basophils Absolute: 0 x10E3/uL (ref 0.0–0.2)
Eosinophils %: 2 %
Eosinophils Absolute: 0.1 x10E3/uL (ref 0.0–0.4)
Hematocrit: 40.8 % (ref 34.0–46.6)
Hemoglobin: 13.4 g/dL (ref 11.1–15.9)
Immature Grans (Abs): 0 x10E3/uL (ref 0.0–0.1)
Immature Granulocytes %: 0 %
Lymphocytes %: 30 %
Lymphocytes Absolute: 2.6 x10E3/uL (ref 0.7–3.1)
MCH: 31.7 pg (ref 26.6–33.0)
MCHC: 32.8 g/dL (ref 31.5–35.7)
MCV: 97 fL (ref 79–97)
Monocytes %: 5 %
Monocytes Absolute: 0.4 x10E3/uL (ref 0.1–0.9)
Neutrophils %: 63 %
Neutrophils Absolute: 5.5 x10E3/uL (ref 1.4–7.0)
Platelets: 221 x10E3/uL (ref 150–450)
RBC: 4.23 x10E6/uL (ref 3.77–5.28)
RDW: 13.2 % (ref 11.7–15.4)
WBC: 8.6 x10E3/uL (ref 3.4–10.8)

## 2024-02-17 LAB — ALBUMIN/CREATININE RATIO, URINE
Albumin Urine: <3 ug/mL
Albumin/Creatinine Ratio: <3 mg/g{creat} (ref 0–29)
Creatinine, Ur: 95.2 mg/dL

## 2024-02-17 LAB — URINALYSIS WITH MICROSCOPIC
Bilirubin, Urine: NEGATIVE
Blood, Urine: NEGATIVE
Glucose, Ur: NEGATIVE
Ketones, Urine: NEGATIVE
Nitrite, Urine: NEGATIVE
Protein, UA: NEGATIVE
Specific Gravity, UA: 1.011 (ref 1.005–1.030)
Urobilinogen, Urine: 0.2 mg/dL (ref 0.2–1.0)
pH, Urine: 5.5 (ref 5.0–7.5)

## 2024-02-17 LAB — HEMOGLOBIN A1C
Estimated Avg Glucose: 232 mg/dL
Hemoglobin A1C: 9.7 % — ABNORMAL HIGH (ref 4.8–5.6)

## 2024-02-17 LAB — MICROSCOPIC EXAMINATION
Casts UA: NONE SEEN /LPF
RBC, UA: NONE SEEN /HPF (ref 0–2)

## 2024-02-18 ENCOUNTER — Ambulatory Visit
Admit: 2024-02-18 | Discharge: 2024-02-18 | Payer: Medicare (Managed Care) | Attending: Internal Medicine | Primary: Internal Medicine

## 2024-02-18 VITALS — BP 135/80 | HR 84 | Temp 98.30000°F | Resp 16 | Ht 68.0 in | Wt 201.0 lb

## 2024-02-18 DIAGNOSIS — I10 Essential (primary) hypertension: Principal | ICD-10-CM

## 2024-02-18 MED ORDER — TRESIBA FLEXTOUCH 200 UNIT/ML SC SOPN
200 | Freq: Every day | SUBCUTANEOUS | 3 refills | 57.00000 days | Status: DC
Start: 2024-02-18 — End: 2024-03-17

## 2024-02-18 MED ORDER — BECLOMETHASONE DIPROP HFA 80 MCG/ACT IN AERB
80 | Freq: Two times a day (BID) | RESPIRATORY_TRACT | 3 refills | Status: AC
Start: 2024-02-18 — End: ?

## 2024-02-18 NOTE — Progress Notes (Signed)
 "HPI:   [name] Wu is a 63 y.o. year old female who presents today for a routine follow-up visit.  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  She is accompanied by her sister.  She reports that she is doing reasonably well.      She reports that she has not been compliant with her diet and was having difficulty obtaining Tresiba  from her pharmacy so missed therapy intermittently for several weeks.  She states that she has now resumed Tresiba  70 mg daily and uses sliding scale Humalog .  She is also finding tirzepatide  10 mg weekly to be helpful with control of her appetite and is pleased with her 10 pound weight loss since 03/2023.  She states that her goal weight is 160 pounds. She missed her follow-up appointment with Pharm.D. Dail on 10/14/2023 and has not rescheduled.  Review of her CGM data today shows 30-day TIR 35% with high readings at 38% and very high at 27%; TBR at 0%.  However, she discusses that she has recently instituted changes indicated by an improvement in her TIR over the last 7 days to 44% with TBR remaining at 0%.  She discusses that she has noted improvement in her bilateral leg pain with an increase in dose of pregabalin  to 200 mg twice daily.    She discusses that her asthma control has worsened over the last few weeks and acknowledges that she was not using Qvar twice daily as prescribed but rather was using only as needed.  She is continuing on Singulair  and reports that she does have a nebulizer to use if needed.  She discusses that she did present to [facility] in 01/2024 due to worsening of her eczema and was prescribed a course of prednisone .    She acknowledges that she has not completed her mammogram but is scheduled on 03/23/2024.  She is scheduled for her bone density study on 04/14/2024.  She states that she has not yet scheduled a  follow-up appointment with Dr. Dorrie or with NP Nicholaus.  She did not keep her appointment with Dr. Angelyn at [facility] for evaluation for sleep apnea and management of her asthma.  She states that she has scheduled an appointment with Dr. Iacobucci.  She discusses that she is planning to proceed with the RSV vaccine.    She is otherwise without new complaints.        Summary of prior hospitalizations and medical history:   On 06/10/2023, she established care with Dr. 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  Extensive lab evaluation was performed and recommendation was to consider a bone marrow biopsy with MRD pending lab results.  It was also recommended that she undergo evaluation by pulmonary for PFT testing given her stem cell transplant, and she was referred to Dr. Angelyn at [facility].  She reported that she was scheduled on 08/22/2023, but did not keep the appointment.  It was also recommended that she stay up-to-date on her health maintenance screening testing, and she was urged to obtain a mammogram and Pap smear. She no-showed for her mammogram appointment on 05/05/2023 and her well woman exam with NP Nicholaus on 07/29/2023.  She missed her follow-up visit with Dr. Dorrie on 09/16/2023.    She has a history of acute lymphoblastic leukemia, diagnosed in 02/2017 after experiencing severe  fatigue and bone pain for several months. She was positive for the Philadelphia chromosome mutation. She presented to the [facility] in [place] for care, and underwent chemotherapy which achieved remission. She subsequently underwent a stem cell transplant on 08/14/2017 which was felt to be curative. She had been on maintenance therapy with a tyrosine kinase inhibitor, nilotimib 200 mg bid. She had been followed by Dr. Randie Hake , transplant oncologist, and had been traveling to [facility] every three months for care. She had a  follow-up visit with Dr. Hake in 12/2020 and treatment with nilotinib was discontinued and follow-up recommended on an annual basis.      He has a history of hypertension, treated with amlodipine. She has a history of hyperlipidemia, and was restarted on atorvastatin  in 07/2019.  She also has a history of diabetes mellitus, currently being treated with Tresiba , Humalog , and Ozempic . She denies any polyuria, polydipsia, nocturia, or blurry vision, and has no history of retinopathy or nephropathy. 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 She was under the care of pain management and being treated with Cymbalta  and Roxicodone as needed, but has noted a significant decrease in the need for narcotics since being started on pregabalin  in 02/2020.     She has a history of asthma since childhood, and is being maintained on Flovent and albuterol as needed. She denies any cough or shortness of breath.      In 04/2018, she was admitted to [facility] from 12/4-12/10/2017 for a UTI and then subsequently readmitted from 12/6-12/17/2019 with MRSA bacteremia related to a PICC line and bibasilar pneumonia.      He has a history of GERD, treated with Protonix. She also underwent a screening colonoscopy on 05/14/2018 by Dr. Elma at the [facility] in [place] to evaluate diarrhea post stem cell transplant. No lesions were found. She denies any abdominal pain, nausea, vomiting, melena, hematochezia, or change in bowel movements.       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)     H/O stem cell transplant (HCC) 041/04/19    Hyperlipidemia 03/22/2019    Hypertension     Insomnia 03/22/2019    Mild  intermittent asthma without complication 03/18/2019    Peripheral neuropathy due to chemotherapy 03/18/2019     Past Surgical History:   Procedure Laterality Date    XR MIDLINE EQUAL OR GREATER THAN 5 YEARS  04/22/2018    XR MIDLINE EQUAL OR GREATER THAN 5 YEARS 04/22/2018     Current Outpatient Medications   Medication Sig    Insulin  Degludec (TRESIBA  FLEXTOUCH) 200 UNIT/ML SOPN Inject 80 Units into the skin daily    beclomethasone (QVAR REDIHALER) 80 MCG/ACT AERB inhaler Inhale 2 puffs into the lungs in the morning and 2 puffs in the evening.    triamcinolone  (KENALOG ) 0.1 % cream APPLY TO AFFECTED AREA TWICE A DAY    pregabalin  (LYRICA ) 200 MG capsule Take 1 capsule by mouth 2 times daily for 180 days. Max Daily Amount: 400 mg    zolpidem  (AMBIEN ) 5 MG tablet Take 1 tablet by mouth nightly as needed for Sleep for  up to 30 days. Max Daily Amount: 5 mg    meclizine  (ANTIVERT ) 25 MG tablet TAKE ONE TABLET BY MOUTH THREE TIMES A DAY AS NEEDED FOR DIZZINESS    insulin  lispro, 1 Unit Dial , (HUMALOG  KWIKPEN) 100 UNIT/ML SOPN CHECK BLOOD GLUCOSE PRIOR TO MEALS AND AT BEDTIME. TAKE ACCORDING TO SLIDING SCALE FOR BS 180-200 USE 2 UNITS, 201-220 USE 4 UNITS, 221-240 USE 5 UNITS, 241-280 USE 6 UNITS, 281-320 USE 7 UNITS, 321-400 USE 8 UNITS, GREATER THAN 400 CALL DOCTOR    cyclobenzaprine  (FLEXERIL ) 10 MG tablet TAKE 1 TABLET BY MOUTH THREE TIMES A DAY AS NEEDED FOR MUSCLE SPASMS    potassium chloride  (KLOR-CON  M20) 20 MEQ extended release tablet Take 2 tablets by mouth daily    Tirzepatide  (MOUNJARO ) 10 MG/0.5ML SOAJ pen Inject 10 mg into the skin once a week Start after completing the 7.5mg  dose - MOUNJARO     montelukast  (SINGULAIR ) 10 MG tablet TAKE 1 TABLET BY MOUTH DAILY TO CONTROL ASTHMA    atorvastatin  (LIPITOR) 20 MG tablet TAKE 1 TABLET BY MOUTH DAILY    lisinopril  (PRINIVIL ;ZESTRIL ) 10 MG tablet TAKE ONE TABLET BY MOUTH EVERY DAY    DULoxetine  (CYMBALTA ) 30 MG extended release capsule Take 1 capsule by mouth 2 times  daily    Continuous Glucose Sensor (FREESTYLE LIBRE 3 SENSOR) MISC USE TO MONITOR BLOOD GLUCOSE CONTINUOUSLY - CHANGE EVERY 14 DAYS    magnesium  oxide (MAG-OX) 400 MG tablet Take 2 tablets by mouth 2 times daily    omeprazole  (PRILOSEC  OTC) 20 MG tablet Take 1 tablet by mouth daily    Insulin  Pen Needle 32G X 4 MM MISC 1 each by Does not apply route daily    BIOTIN PO Take 1 tablet by mouth daily    ZINC  PO Take by mouth    albuterol sulfate HFA (PROVENTIL;VENTOLIN;PROAIR) 108 (90 Base) MCG/ACT inhaler Inhale 1 puff into the lungs every 4 hours as needed    albuterol (PROVENTIL) (2.5 MG/3ML) 0.083% nebulizer solution Inhale 3 mLs into the lungs every 4 hours as needed    vitamin D3 (CHOLECALCIFEROL) 125 MCG (5000 UT) TABS tablet Take 1 tablet by mouth daily    diphenhydrAMINE (BENADRYL) 25 MG capsule Take 1 capsule by mouth every 6 hours as needed    Specialty Vitamins Products (MAGNESIUM , AMINO ACID CHELATE,) 133 MG tablet Take 1 tablet by mouth 2 times daily     No current facility-administered medications for this visit.     Allergies and Intolerances:   Allergies   Allergen Reactions    Penicillins Hives and Itching     Family History:  She has no family history of colon cancer of leukemia. Mother had breast cancer at the age of 49.   Family History   Problem Relation Age of Onset    Breast Cancer Mother      Social History:   She  reports that she has never smoked. She has never used smokeless tobacco. She is divorced and has no children. She is employed as a research scientist (medical).  Social History     Substance and Sexual Activity   Alcohol Use No     Immunization History:  Immunization History   Administered Date(s) Administered    COVID-19, COMIRNATY Proofreader), (age 12y+), IM, 30mcg/0.3mL 02/15/2023    COVID-19, Inactive, MODERNA BLUE border, Primary or Immunocompromised, (age 12y+) 06/16/2019, 07/14/2019    COVID-19, Inactive, MODERNA Bivalent, (age 12y+) 09/13/2021    COVID-19, Inactive, PFIZER Bivalent, DO NOT Dilute,  (  age 12y+) 01/29/2021    COVID-19, Inactive, PFIZER PURPLE top, DILUTE for use, (age 578 y+) 03/12/2020, 10/22/2020    COVID-19, SPIKEVAX Laren), (age 12y+), IM, 79mcg/0.5mL 02/18/2022, 07/25/2022    Influenza Trivalent 02/27/2018, 02/23/2019, 03/01/2020    Influenza Virus Vaccine 02/18/2022    Influenza, FLUARIX, FLULAVAL, FLUZONE (age 57 mo+) and AFLURIA, (age 73 y+), Quadv PF, 0.50mL 01/23/2021, 02/15/2023    Influenza, FLUCELVAX, (age 57 mo+) IM, Trivalent PF, 0.5mL 02/18/2024    Pneumococcal, PCV20, PREVNAR 20, (age 57w+), IM, 0.53mL 01/23/2021    Pneumococcal, PPSV23, PNEUMOVAX 23, (age 2y+), SC/IM, 0.22mL 03/18/2019    TDaP, ADACEL (age 57y-64y), MYRTICE (age 10y+), IM, 0.5mL 03/18/2019    Zoster Recombinant (Shingrix) 06/11/2021, 09/13/2021       Review of Systems:   As above included in HPI.  Otherwise 11 point review of systems negative including constitutional, skin, HENT, eyes, respiratory, cardiovascular, gastrointestinal, genitourinary, musculoskeletal, endocrine, hematologic, allergy, and neurologic.    Physical:   Vitals:    02/18/24 1403   BP: 135/80   BP Site: Left Upper Arm   Patient Position: Sitting   BP Cuff Size: Large Adult   Pulse: 84   Resp: 16   Temp: 98.3 F (36.8 C)   TempSrc: Temporal   SpO2: 96%   Weight: 91.2 kg (201 lb)   Height: 1.727 m (5' 8)         Exam:   Patient appears in no apparent distress. Affect is appropriate.    HEENT: PERRLA, anicteric, oropharynx clear, no JVD, adenopathy or thyromegaly. No carotid bruits or radiated murmur.  Lungs: clear to auscultation, no wheezes, rhonchi, or rales.  Heart: regular rate and rhythm. No murmur, rubs, gallops  Abdomen: soft, nontender, nondistended, normal bowel sounds, no hepatosplenomegaly or masses.   Extremities: without edema.      Diabetic foot exam:   Left Foot:   Visual Exam: normal   Pulse DP: 2+ (normal)   Filament test: normal sensation   Vibratory Sensation: normal  Right Foot:   Visual Exam: normal   Pulse DP: 2+  (normal)   Filament test: normal sensation   Vibratory Sensation: normal        Review of Data:  Labs:  Hospital Outpatient Visit on 02/16/2024   Component Date Value Ref Range Status    [facility] Specimen Collection 02/16/2024 Specimens collected/sent to [facility]. Please direct inquiries to [id number]).    Final   Office Visit on 02/18/2024   Component Date Value Ref Range Status    Magnesium  02/18/2024 1.9  1.6 - 2.3 mg/dL Final    Glucose 89/91/7974 299 (H)  70 - 99 mg/dL Final    BUN 89/91/7974 7 (L)  8 - 27 mg/dL Final    Creatinine 89/91/7974 0.98  0.57 - 1.00 mg/dL Final    Est, Glom Filt Rate 02/18/2024 65  >59 mL/min/1.73 Final    BUN/Creatinine Ratio 02/18/2024 7 (L)  12 - 28 Final    Sodium 02/18/2024 139  134 - 144 mmol/L Final    Potassium 02/18/2024 5.0  3.5 - 5.2 mmol/L Final    Chloride 02/18/2024 99  96 - 106 mmol/L Final    CO2 02/18/2024 26  20 - 29 mmol/L Final    Calcium  02/18/2024 9.3  8.7 - 10.3 mg/dL Final    Total Protein 02/18/2024 6.5  6.0 - 8.5 g/dL Final    Albumin 89/91/7974 4.0  3.9 - 4.9 g/dL Final    Globulin, Total 02/18/2024 2.5  1.5 - 4.5 g/dL Final    Total Bilirubin 02/18/2024 0.5  0.0 - 1.2 mg/dL Final    Alkaline Phosphatase 02/18/2024 115  49 - 135 IU/L Final    AST 02/18/2024 19  0 - 40 IU/L Final    ALT 02/18/2024 16  0 - 32 IU/L Final    Cholesterol, Total 02/18/2024 124  100 - 199 mg/dL Final    Triglycerides 02/18/2024 189 (H)  0 - 149 mg/dL Final    HDL 89/91/7974 42  >39 mg/dL Final    VLDL Cholesterol Calculated 02/18/2024 31  5 - 40 mg/dL Final    LDL Cholesterol 02/18/2024 51  0 - 99 mg/dL Final     Lab Results   Component Value Date    WBC 8.6 02/16/2024    HGB 13.4 02/16/2024    HCT 40.8 02/16/2024    MCV 97 02/16/2024    PLT 221 02/16/2024     Lab Results   Component Value Date    NA 139 02/18/2024    K 5.0 02/18/2024    CL 99 02/18/2024    CO2 26 02/18/2024    BUN 7 (L) 02/18/2024    CREATININE 0.98 02/18/2024    GLUCOSE 299 (H) 02/18/2024    CALCIUM  9.3  02/18/2024    LABALBU <3.0 06/25/2022    BILITOT 0.5 02/18/2024    ALKPHOS 115 02/18/2024    AST 19 02/18/2024    ALT 16 02/18/2024    LABGLOM 65 02/18/2024    GFRAA >60 01/12/2021    AGRATIO 1.8 06/25/2022    GLOB 3.0 05/08/2022       Lab Results   Component Value Date    CHOL 124 02/18/2024    TRIG 189 (H) 02/18/2024    HDL 42 02/18/2024    LDL 51 02/18/2024    VLDL 31 02/18/2024    CHOLHDLRATIO 3.1 05/08/2022     Lab Results   Component Value Date    ALBCREAT <3 02/16/2024     Lab Results   Component Value Date/Time    MG 1.9 02/18/2024 02:39 PM     Lab Results   Component Value Date/Time    VITD25 38.2 02/16/2024 12:00 AM      Hemoglobin A1C   Date Value Ref Range Status   02/16/2024 9.7 (H) 4.8 - 5.6 % Final     Comment:                 Prediabetes: 5.7 - 6.4           Diabetes: >6.4           Glycemic control for adults with diabetes: <7.0       Urinalysis (02/16/2024) showed 3+ leukocyte esterase and 11-30 WBCs with calcium  oxalate crystals.       Health Maintenance:  Screening:               Mammogram: Right mammo/ultrasound negative (10/2020); Due in 01/2021  OVERDUE. Scheduled 03/23/2024.              PAP smear: well woman exam with NP Gretel (Pap/HPV negative on 02/15/2020) OVERDUE              Colorectal: colonoscopy (05/14/2018) normal.  Dr. Elma in [place].  Due 05/2028.              Depression: none              DM (HbA1c/FPG): FPG 168/HbA1c 9.7 (02/2024)  Hepatitis C: negative (03/2019)              Falls: none              DEXA: Scheduled 04/14/2024              Glaucoma: regular eye exams with Dr. Iacobucci (last 10/2020)  OVERDUE.  Scheduled.              Smoking: none              Vitamin D: 38.2 (02/2024)              Medicare Wellness: 11/11/2023       Impression:  Patient Active Problem List   Diagnosis    Peripheral neuropathy due to chemotherapy    Acute lymphoblastic leukemia (ALL) in remission (HCC)    Essential hypertension    Type 2 diabetes mellitus with diabetic neuropathy, with  long-term current use of insulin  (HCC)    S/P bone marrow transplant (HCC)    Eczema    Elevated alkaline phosphatase level    Primary osteoarthritis involving multiple joints    GERD (gastroesophageal reflux disease)    Vestibular neuronitis    Hyperlipidemia    Class 1 obesity due to excess calories with serious comorbidity in adult    Mild intermittent asthma    Noncompliance    Stage 3 chronic kidney disease (HCC)    Type 2 diabetes mellitus with chronic kidney disease, with long-term current use of insulin  (HCC)    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 Diabetes mellitus. Previously well controlled on regimen of Tresiba  and SS Humalog  with HbA1c 6.5 in 03/2019. However, progressively worsened due to weight gain and difficulty obtaining insulin . Acquired Freestyle libre 3 CGM and established care with Pharm.D. Dail but not been consistent with follow-up. Reports currently on Tresiba  70 units daily sliding scale Humalog , and Mounjaro  10 mg weekly with HbA1c worsened today to 9.7.  Review of CGM data shows TIR at 35%  over the last month with TBR 0%.  Advised to titrate Tresiba  by 2 units daily to achieve fasting morning glucose below 130.  Advised to schedule follow-up with Pharm.D. Dail to help with medication adjustment.  No evidence of microvascular complications. On statin and on lisinopril . Continue regular eye exams with Dr. Iacobucci.  Overdue and reports now scheduled.  Foot exam grossly normal (02/2024), and no evidence of albuminuria (< 3 mg/g). Emphasized importance of lifestyle modifications, including heart healthy low carbohydrate diet, regular exercise, and weight loss.  Will reassess HbA1c in 3 months.  2. Acute lymphoblastic leukemia, +Philadelphia chromosone mutation, s/p stem cell transplant, in remission. Diagnosed in 02/2017 and completed induction chemotherapy followed by stem cell transplant in 08/2017.  Had been on maintenance therapy with a TKI (nilotinib), but  discontinued in 12/2020 by her oncologist.  Had been followed closely by Dr. Randie Hake, transplant oncologist at [facility] in [place].  Reported frequency of follow-up had been decreased to annually but unable to keep appointment in 12/2022 due to insurance change.  Referred to Dr. Dorrie on 06/10/2023 and extensive lab evaluation performed.  Advised that considering bone marrow biopsy with MRD pending lab results but did not keep follow-up appointment with Dr. Dorrie on 09/16/2023 to review results.  Urged to reschedule..  3. Hypertension. Blood pressure remains well controlled on lisinopril  10 mg daily today. Renal function stable today at 1.05/eGFR 60  consistent with chronic renal disease.  Worsening renal function likely related to uncontrolled diabetes.  On potassium and magnesium  supplements due to long term difficulty with deficiency and levels remain stable today. Will continue to monitor.  4. Hyperlipidemia.  On moderate intensity dose atorvastatin  with LDL 51 and HDL 42, indicative of excellent control.  Goal LDL <70 given diabetes mellitus. Emphasized importance of lifestyle modifications, including heart healthy low carbohydrate diet, regular exercise, and weight loss. Continue to follow.  5. Peripheral neuropathy, painful. Secondary to chemotherapy used to treat ALL. Did not tolerate gabapentin due to hallucinations. On Cymbalta  30 mg bid and started on pregabalin  in 02/2021 with good effect.  Previously on pregabalin  150 mg twice daily but reported only taking nightly.  Dose of pregabalin  increased to 200 mg twice daily with better control.  Advised to continue using Flexeril  as needed for cramping.  Overall improved today.  6. Asthma, mild intermittent.  Noting significant improvement since initiation of Singulair  10 mg daily following exacerbation in 03/2021.  Now on Qvar for maintenance therapy and albuterol as needed with good control. Referred by Dr. Dorrie to establish care with  Dr. Angelyn for PFT testing given her stem cell transplant, but did not keep scheduled appointment 08/22/2023.  Also recommended sleep evaluation due to ongoing fatigue, body habitus, and worsening restless leg symptoms.  Urged to reschedule appointment.  Reporting increasing symptoms today and not taking Qvar regularly.  Advised to resume maintenance therapy Qvar twice daily until improved.  Will readdress at next visit.  7. GERD. On Protonix 40 mg bid with good control. Follow.  8.  Chronic renal disease, stage 2-3.  Noted intermittent elevation of creatinine since 05/2021 with recent baseline ranging 1.07-1.11/eGFR 56-59.  Worsening function likely related to hyperglycemia due to poorly controlled blood sugar.  On ACE inhibitor and statin.  Will consider addition of SGLT2 inhibitor at next visit.  Stressed importance of blood sugar control in order to avoid further progression.  Advised to maintain good fluid intake and avoid NSAIDs.  Also noted abnormal urinalysis but patient denying any symptoms of infection.  Will obtain repeat urinalysis and culture.  Discussed finding of calcium  oxalate crystals in urine and urged a low oxalate diet and high fluid intake to prevent nephrolithiasis.  Will consider renal ultrasound for any change in renal function.  Will continue to monitor.  9. Elevated alkaline phosphatase. GGT elevated at 97 so likely of hepatic origin (04/2020). AMA negative (04/2020) and alkaline phosphatase isozymes with normal differential and predominant hepatic origin (04/2020).  Right upper quadrant ultrasound ordered but patient never obtained. Repeat alkaline phosphatase normalized today at 108. Suspect improvement related to discontinuation of nilotinib. Will continue to monitor.  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  Reported increasing difficulty with vertigo and stated that she had experienced multiple falls. Continuing difficulty with disequilibrium.  Referred to balance therapy in 01/2021 and again in 05/2021  but never scheduled due to cost. Advised use of meclizine  as needed.  Reports overall improvement in symptoms today.  11. Insomnia. Using Ambien  +/- Benadryl as needed. Emphasized good sleep hygiene.  Continue to monitor.  12. Eczema. On Xyzal and will use Kenalog  as needed.  Reports recent exacerbation requiring course of steroids.  13. Abnormal mammogram. Patient reports had previous biopsy of right breast which was benign. Screening mammogram (02/2019) showed multiple abnormalities in right breast. Right breast diagnostic mammogram and ultrasound (05/12/2019) showed probably benign findings, but recommendation was to proceed with 6 month follow-up. Underwent repeat diagnostic right  mammogram and right breast ultrasound in 10/2020 and findings felt to be benign. Recommended continuing with annual follow-up and due for bilateral screening in 02/2021.  Order again placed on 08/01/2023 for bilateral mammogram and right breast ultrasound and urged to schedule.  Patient with FH history of breast cancer in her mother.  14. Obesity. Weight decreased 10 pounds since 03/2023 and 6 pounds since last visit. On tirzepatide  and titrated to 10 mg weekly with appetite suppressant effect.  Emphasized importance of continuing with attempts lifestyle modifications, including heart healthy low carbohydrate diet, regular exercise, and weight loss.  15. Health maintenance. Completed 4 doses of the COVID 19 vaccine series due to immunosuppressed status and the bivalent Pfizer booster dose.  Completed Prevnar 20 and Shingrix vaccine series.  Discussed recommended vaccines for fall, including influenza vaccine, updated COVID vaccine, and new RSV vaccine.  Will give influenza vaccine today.  Other immunizations up-to-date.  Bilateral mammogram due in 02/2021 and reports scheduled.  Also scheduled for bone density study.  Urged to keep appointments.  Colonoscopy completed in [place] on 05/14/2018.SABRA  Completed well woman exam with NP Allena Gleason and chart updated, but due for follow-up with NP Nicholaus.  Also completed eye exam with Dr. Karolyn urged to keep appointment.  Vitamin D level remains normal on maintenance dose supplement. Emphasized importance of lifestyle modifications, including heart healthy diet, regular exercise, and weight loss. Medicare wellness visit up-to-date.     Patient understands recommendations and agrees with plan.  Follow-up in 3 months.      This visit required high complexity medically necessary decision making and management plans.     Time spent in preparing for the visit, including review of history, tests done prior to arrival, additional time reviewing clinical data, imaging, outside records and test results: 5 minutes.  Time spent in counseling with patient and/or family members regarding care plan: 30 minutes.  Time spent on same-day documentation, ordering tests, treatments, and referring patient for further care: 15 minutes.   Time spent on visit does not include time for documentation not completed on day of visit.       "

## 2024-02-18 NOTE — Progress Notes (Signed)
"  Have you been to the ER, urgent care clinic since your last visit?  Hospitalized since your last visit?   Yes [facility]     Have you seen or consulted any other health care providers outside our system since your last visit?   Yes [facility] sometime in September     Have you had a mammogram?   NO has appointment     Date of last Mammogram: 10/27/2020       Have you had a diabetic eye exam?    NO Has appointment      Date of last diabetic eye exam: 10/20/2020           "

## 2024-02-18 NOTE — Patient Instructions (Signed)
Heart-Healthy Diet: Care Instructions  Your Care Instructions     A heart-healthy diet has lots of vegetables, fruits, nuts, beans, and whole grains, and is low in salt. It limits foods that are high in saturated fat, such as meats, cheeses, and fried foods. It may be hard to change your diet, but even small changes can lower your risk of heart attack and heart disease.  Follow-up care is a key part of your treatment and safety. Be sure to make and go to all appointments, and call your doctor if you are having problems. It's also a good idea to know your test results and keep a list of the medicines you take.  How can you care for yourself at home?  Watch your portions  Learn what a serving is. A "serving" and a "portion" are not always the same thing. Make sure that you are not eating larger portions than are recommended. For example, a serving of pasta is  cup. A serving size of meat is 2 to 3 ounces. A 3-ounce serving is about the size of a deck of cards. Measure serving sizes until you are good at "eyeballing" them. Keep in mind that restaurants often serve portions that are 2 or 3 times the size of one serving.  To keep your energy level up and keep you from feeling hungry, eat often but in smaller portions.  Eat only the number of calories you need to stay at a healthy weight. If you need to lose weight, eat fewer calories than your body burns (through exercise and other physical activity).  Eat more fruits and vegetables  Eat a variety of fruit and vegetables every day. Dark green, deep orange, red, or yellow fruits and vegetables are especially good for you. Examples include spinach, carrots, peaches, and berries.  Keep carrots, celery, and other veggies handy for snacks. Buy fruit that is in season and store it where you can see it so that you will be tempted to eat it.  Cook dishes that have a lot of veggies in them, such as stir-fries and soups.  Limit saturated and trans fat  Read food labels, and try  to avoid saturated and trans fats. They increase your risk of heart disease.  Use olive or canola oil when you cook.  Bake, broil, grill, or steam foods instead of frying them.  Choose lean meats instead of high-fat meats such as hot dogs and sausages. Cut off all visible fat when you prepare meat.  Eat fish, skinless poultry, and meat alternatives such as soy products instead of high-fat meats. Soy products, such as tofu, may be especially good for your heart.  Choose low-fat or fat-free milk and dairy products.  Eat foods high in fiber  Eat a variety of grain products every day. Include whole-grain foods that have lots of fiber and nutrients. Examples of whole-grain foods include oats, whole wheat bread, and brown rice.  Buy whole-grain breads and cereals, instead of white bread or pastries.  Limit salt and sodium  Limit how much salt and sodium you eat to help lower your blood pressure.  Taste food before you salt it. Add only a little salt when you think you need it. With time, your taste buds will adjust to less salt.  Eat fewer snack items, fast foods, and other high-salt, processed foods. Check food labels for the amount of sodium in packaged foods.  Choose low-sodium versions of canned goods (such as soups, vegetables, and beans).  Limit   sugar  Limit drinks and foods with added sugar. These include candy, desserts, and soda pop.  Limit alcohol  Limit alcohol to no more than 2 drinks a day for men and 1 drink a day for women. Too much alcohol can cause health problems.  When should you call for help?  Watch closely for changes in your health, and be sure to contact your doctor if:    You would like help planning heart-healthy meals.   Where can you learn more?  Go to https://www.healthwise.net/GoodHelpConnections  Enter V137 in the search box to learn more about "Heart-Healthy Diet: Care Instructions."  Current as of: January 01, 2018               Content Version: 12.6   2006-2020 [employer].    Care instructions adapted under license by [facility] (which disclaims liability or warranty for this information). If you have questions about a medical condition or this instruction, always ask your healthcare professional. [employer] disclaims any warranty or liability for your use of this information.    A Healthy Lifestyle: Care Instructions  A healthy lifestyle can help you feel good, have more energy, and stay at a weight that's healthy for you. You can share a healthy lifestyle with your friends and family. And you can do it on your own.    Eat meals with your friends or family. You could try cooking together.    Plan activities with other people. Go for a walk with a friend, try a free online fitness class, or join a sports league.    Eat a variety of healthy foods. These include fruits, vegetables, whole grains, low-fat dairy, and lean protein.    Choose healthy portions of food. You can use the Nutrition Facts label on food packages as a guide.    Eat more fruits and vegetables. You could add vegetables to sandwiches or add fruit to cereal.    Drink water when you are thirsty. Limit soda, juice, and sports drinks.    Try to exercise most days. Aim for at least 2 hours of exercise each week.    Keep moving. Work in the garden or take your dog on a walk. Use the stairs instead of the elevator.    If you use tobacco or nicotine, try to quit. Ask your doctor about programs and medicines to help you quit.    Limit alcohol. Men should have no more than 2 drinks a day. Women should have no more than 1. For some people, no alcohol is the best choice.  Follow-up care is a key part of your treatment and safety. Be sure to make and go to all appointments, and call your doctor if you are having problems. It's also a good idea to know your test results and keep a list of the medicines you take.  Where can you learn more?  Go to https://www.healthwise.net/patientEd and enter U807 to learn  more about "A Healthy Lifestyle: Care Instructions."  Current as of: December 16, 2021               Content Version: 14.0   2006-2024 [employer].   Care instructions adapted under license by [facility]. If you have questions about a medical condition or this instruction, always ask your healthcare professional. [employer] disclaims any warranty or liability for your use of this information.      Learning About Diabetes Food Guidelines  Your Care Instructions       Meal planning is important to manage diabetes. It helps keep your blood sugar at a target level (which you set with your doctor). You don't have to eat special foods. You can eat what your family eats, including sweets once in a while. But you do have to pay attention to how often you eat and how much you eat of certain foods.  You may want to work with a dietitian or a certified diabetes educator (CDE) to help you plan meals and snacks. A dietitian or CDE can also help you lose weight if that is one of your goals.  What should you know about eating carbs?  Managing the amount of carbohydrate (carbs) you eat is an important part of healthy meals when you have diabetes. Carbohydrate is found in many foods.  Learn which foods have carbs. And learn the amounts of carbs in different foods.  Bread, cereal, pasta, and rice have about 15 grams of carbs in a serving. A serving is 1 slice of bread (1 ounce),  cup of cooked cereal, or 1/3 cup of cooked pasta or rice.  Fruits have 15 grams of carbs in a serving. A serving is 1 small fresh fruit, such as an apple or orange;  of a banana;  cup of cooked or canned fruit;  cup of fruit juice; 1 cup of melon or raspberries; or 2 tablespoons of dried fruit.  Milk and no-sugar-added yogurt have 15 grams of carbs in a serving. A serving is 1 cup of milk or 2/3 cup of no-sugar-added yogurt.  Starchy vegetables have 15 grams of carbs in a serving. A serving is  cup of mashed potatoes or sweet  potato; 1 cup winter squash;  of a small baked potato;  cup of cooked beans; or  cup cooked corn or green peas.  Learn how much carbs to eat each day and at each meal. A dietitian or CDE can teach you how to keep track of the amount of carbs you eat. This is called carbohydrate counting.  If you are not sure how to count carbohydrate grams, use the Plate Method to plan meals. It is a good, quick way to make sure that you have a balanced meal. It also helps you spread carbs throughout the day.  Divide your plate by types of foods. Put non-starchy vegetables on half the plate, meat or other protein food on one-quarter of the plate, and a grain or starchy vegetable in the final quarter of the plate. To this you can add a small piece of fruit and 1 cup of milk or yogurt, depending on how many carbs you are supposed to eat at a meal.  Try to eat about the same amount of carbs at each meal. Do not "save up" your daily allowance of carbs to eat at one meal.  Proteins have very little or no carbs per serving. Examples of proteins are beef, chicken, turkey, fish, eggs, tofu, cheese, cottage cheese, and peanut butter. A serving size of meat is 3 ounces, which is about the size of a deck of cards. Examples of meat substitute serving sizes (equal to 1 ounce of meat) are 1/4 cup of cottage cheese, 1 egg, 1 tablespoon of peanut butter, and  cup of tofu.  How can you eat out and still eat healthy?  Learn to estimate the serving sizes of foods that have carbohydrate. If you measure food at home, it will be easier to estimate the amount in a serving of restaurant food.    If the meal you order has too much carbohydrate (such as potatoes, corn, or baked beans), ask to have a low-carbohydrate food instead. Ask for a salad or green vegetables.  If you use insulin, check your blood sugar before and after eating out to help you plan how much to eat in the future.  If you eat more carbohydrate at a meal than you had planned, take a walk  or do other exercise. This will help lower your blood sugar.  What else should you know?  Limit saturated fat, such as the fat from meat and dairy products. This is a healthy choice because people who have diabetes are at higher risk of heart disease. So choose lean cuts of meat and nonfat or low-fat dairy products. Use olive or canola oil instead of butter or shortening when cooking.  Don't skip meals. Your blood sugar may drop too low if you skip meals and take insulin or certain medicines for diabetes.  Check with your doctor before you drink alcohol. Alcohol can cause your blood sugar to drop too low. Alcohol can also cause a bad reaction if you take certain diabetes medicines.  Follow-up care is a key part of your treatment and safety. Be sure to make and go to all appointments, and call your doctor if you are having problems. It's also a good idea to know your test results and keep a list of the medicines you take.  Where can you learn more?  Go to https://www.healthwise.net/GoodHelpConnections  Enter I147 in the search box to learn more about "Learning About Diabetes Food Guidelines."  Current as of: May 01, 2018               Content Version: 12.6   2006-2020 [employer].   Care instructions adapted under license by [facility] (which disclaims liability or warranty for this information). If you have questions about a medical condition or this instruction, always ask your healthcare professional. [employer] disclaims any warranty or liability for your use of this information.        Learning About Meal Planning for Diabetes  Why plan your meals?     Meal planning can be a key part of managing diabetes. Planning meals and snacks with the right balance of carbohydrate, protein, and fat can help you keep your blood sugar at the target level you set with your doctor.  You don't have to eat special foods. You can eat what your family eats, including sweets once in a  while. But you do have to pay attention to how often you eat and how much you eat of certain foods.  You may want to work with a dietitian or a certified diabetes educator. He or she can give you tips and meal ideas and can answer your questions about meal planning. This health professional can also help you reach a healthy weight if that is one of your goals.  What plan is right for you?  Your dietitian or diabetes educator may suggest that you start with the plate format or carbohydrate counting.  The plate format  The plate format is a simple way to help you manage how you eat. You plan meals by learning how much space each food should take on a plate. Using the plate format helps you spread carbohydrate throughout the day. It can make it easier to keep your blood sugar level within your target range. It also helps you see if you're eating healthy portion sizes.    To use the plate format, you put non-starchy vegetables on half your plate. Add meat or meat substitutes on one-quarter of the plate. Put a grain or starchy vegetable (such as brown rice or a potato) on the final quarter of the plate. You can add a small piece of fruit and some low-fat or fat-free milk or yogurt, depending on your carbohydrate goal for each meal.  Here are some tips for using the plate format:  Make sure that you are not using an oversized plate. A 9-inch plate is best. Many restaurants use larger plates.  Get used to using the plate format at home. Then you can use it when you eat out.  Write down your questions about using the plate format. Talk to your doctor, a dietitian, or a diabetes educator about your concerns.  Carbohydrate counting  With carbohydrate counting, you plan meals based on the amount of carbohydrate in each food. Carbohydrate raises blood sugar higher and more quickly than any other nutrient. It is found in desserts, breads and cereals, and fruit. It's also found in starchy vegetables such as potatoes and corn,  grains such as rice and pasta, and milk and yogurt. Spreading carbohydrate throughout the day helps keep your blood sugar levels within your target range.  Your daily amount depends on several things, including your weight, how active you are, which diabetes medicines you take, and what your goals are for your blood sugar levels. A registered dietitian or diabetes educator can help you plan how much carbohydrate to include in each meal and snack.  A guideline for your daily amount of carbohydrate is:  45 to 60 grams at each meal. That's about the same as 3 to 4 carbohydrate servings.  15 to 20 grams at each snack. That's about the same as 1 carbohydrate serving.  The Nutrition Facts label on packaged foods tells you how much carbohydrate is in a serving of the food. First, look at the serving size on the food label. Is that the amount you eat in a serving? All of the nutrition information on a food label is based on that serving size. So if you eat more or less than that, you'll need to adjust the other numbers. Total carbohydrate is the next thing you need to look for on the label. If you count carbohydrate servings, one serving of carbohydrate is 15 grams.  For foods that don't come with labels, such as fresh fruits and vegetables, you'll need a guide that lists carbohydrate in these foods. Ask your doctor, dietitian, or diabetes educator about books or other nutrition guides you can use.  If you take insulin, you need to know how many grams of carbohydrate are in a meal. This lets you know how much rapid-acting insulin to take before you eat. If you use an insulin pump, you get a constant rate of insulin during the day. So the pump must be programmed at meals to give you extra insulin to cover the rise in blood sugar after meals.  When you know how much carbohydrate you will eat, you can take the right amount of insulin. Or, if you always use the same amount of insulin, you need to make sure that you eat the same  amount of carbohydrate at meals.  If you need more help to understand carbohydrate counting and food labels, ask your doctor, dietitian, or diabetes educator.  How do you get started with meal planning?  Here are some tips to get started:  Plan   your meals a week at a time. Don't forget to include snacks too.  Use cookbooks or online recipes to plan several main meals. Plan some quick meals for busy nights. You also can double some recipes that freeze well. Then you can save half for other busy nights when you don't have time to cook.  Make sure you have the ingredients you need for your recipes. If you're running low on basic items, put these items on your shopping list too.  List foods that you use to make breakfasts, lunches, and snacks. List plenty of fruits and vegetables.  Post this list on the refrigerator. Add to it as you think of more things you need.  Take the list to the store to do your weekly shopping.  Follow-up care is a key part of your treatment and safety. Be sure to make and go to all appointments, and call your doctor if you are having problems. It's also a good idea to know your test results and keep a list of the medicines you take.  Where can you learn more?  Go to https://www.healthwise.net/GoodHelpConnections  Enter X936 in the search box to learn more about "Learning About Meal Planning for Diabetes."  Current as of: May 01, 2018               Content Version: 12.6   2006-2020 [employer].   Care instructions adapted under license by [facility] (which disclaims liability or warranty for this information). If you have questions about a medical condition or this instruction, always ask your healthcare professional. [employer] disclaims any warranty or liability for your use of this information.

## 2024-02-19 LAB — LIPID PANEL
Cholesterol, Total: 126 mg/dL (ref 100–199)
Cholesterol, Total: 124 mg/dL (ref 100–199)
HDL: 41 mg/dL (ref >39)
HDL: 42 mg/dL (ref >39)
LDL Cholesterol: 59 mg/dL (ref 0–99)
LDL Cholesterol: 51 mg/dL (ref 0–99)
Triglycerides: 150 mg/dL — ABNORMAL HIGH (ref 0–149)
Triglycerides: 189 mg/dL — ABNORMAL HIGH (ref 0–149)
VLDL Cholesterol Calculated: 26 mg/dL (ref 5–40)
VLDL Cholesterol Calculated: 31 mg/dL (ref 5–40)

## 2024-02-19 LAB — MAGNESIUM
Magnesium: 2.1 mg/dL (ref 1.6–2.3)
Magnesium: 1.9 mg/dL (ref 1.6–2.3)

## 2024-02-19 LAB — COMPREHENSIVE METABOLIC PANEL
ALT: 15 IU/L (ref 0–32)
ALT: 16 IU/L (ref 0–32)
AST: 16 IU/L (ref 0–40)
AST: 19 IU/L (ref 0–40)
Albumin: 4 g/dL (ref 3.9–4.9)
Albumin: 4 g/dL (ref 3.9–4.9)
Alkaline Phosphatase: 108 IU/L (ref 49–135)
Alkaline Phosphatase: 115 IU/L (ref 49–135)
BUN/Creatinine Ratio: 8 — ABNORMAL LOW (ref 12–28)
BUN/Creatinine Ratio: 7 — ABNORMAL LOW (ref 12–28)
BUN: 8 mg/dL (ref 8–27)
BUN: 7 mg/dL — ABNORMAL LOW (ref 8–27)
CO2: 17 mmol/L — ABNORMAL LOW (ref 20–29)
CO2: 26 mmol/L (ref 20–29)
Calcium: 8.8 mg/dL (ref 8.7–10.3)
Calcium: 9.3 mg/dL (ref 8.7–10.3)
Chloride: 103 mmol/L (ref 96–106)
Chloride: 99 mmol/L (ref 96–106)
Creatinine: 1.05 mg/dL — ABNORMAL HIGH (ref 0.57–1.00)
Creatinine: 0.98 mg/dL (ref 0.57–1.00)
Est, Glom Filt Rate: 60 mL/min/1.73 (ref >59)
Est, Glom Filt Rate: 65 mL/min/1.73 (ref >59)
Globulin, Total: 2.3 g/dL (ref 1.5–4.5)
Globulin, Total: 2.5 g/dL (ref 1.5–4.5)
Glucose: 168 mg/dL — ABNORMAL HIGH (ref 70–99)
Glucose: 299 mg/dL — ABNORMAL HIGH (ref 70–99)
Potassium: 4.6 mmol/L (ref 3.5–5.2)
Potassium: 5 mmol/L (ref 3.5–5.2)
Sodium: 141 mmol/L (ref 134–144)
Sodium: 139 mmol/L (ref 134–144)
Total Bilirubin: 0.5 mg/dL (ref 0.0–1.2)
Total Bilirubin: 0.5 mg/dL (ref 0.0–1.2)
Total Protein: 6.3 g/dL (ref 6.0–8.5)
Total Protein: 6.5 g/dL (ref 6.0–8.5)

## 2024-03-15 ENCOUNTER — Encounter: Payer: Medicare (Managed Care) | Primary: Internal Medicine

## 2024-03-17 ENCOUNTER — Encounter: Admit: 2024-03-17 | Discharge: 2024-03-17 | Payer: Medicare (Managed Care) | Primary: Internal Medicine

## 2024-03-17 MED ORDER — INSULIN LISPRO (1 UNIT DIAL) 100 UNIT/ML SC SOPN
100 | Freq: Three times a day (TID) | SUBCUTANEOUS | 4 refills | Status: AC
Start: 2024-03-17 — End: ?

## 2024-03-17 MED ORDER — TIRZEPATIDE 15 MG/0.5ML SC SOAJ
15 | SUBCUTANEOUS | 0 refills | 28.00000 days | Status: DC
Start: 2024-03-17 — End: 2024-04-26

## 2024-03-17 MED ORDER — TIRZEPATIDE 12.5 MG/0.5ML SC SOAJ
12.5 | SUBCUTANEOUS | 0 refills | 28.00000 days | Status: DC
Start: 2024-03-17 — End: 2024-04-26

## 2024-03-17 MED ORDER — TRESIBA FLEXTOUCH 200 UNIT/ML SC SOPN
200 | Freq: Every day | SUBCUTANEOUS | 3 refills | Status: AC
Start: 2024-03-17 — End: 2024-06-15

## 2024-03-17 NOTE — Assessment & Plan Note (Deleted)
" {  A/P Summary:[phone]}    Orders:    Insulin  Degludec (TRESIBA  FLEXTOUCH) 200 UNIT/ML SOPN; Inject 66 Units into the skin daily    "

## 2024-03-17 NOTE — Progress Notes (Signed)
 "    Pharmacy Progress Note - Diabetes Management    Assessment & Plan  Type 2 diabetes mellitus with hyperglycemia, with long-term current use of insulin  (HCC)   Per ADA guidelines, [name] A1c is not at goal of < 7%.  - Management significantly improved. Time in target range 69%, average glucose 157, estimated A1c (GMI) 7.1% for past 2 weeks. Last month, A1c 9.7%.  - Tresiba  66 units daily. Humalog  sliding scale discontinued; consistent 5 units with meals. Increase Mounjaro  to 12.5 mg for 4 weeks, then 15 mg weekly. Prescriptions sent to pharmacy.  - Changed diet per nutritionist: protein shakes, fruits, salads; eliminating fried foods, sweets, sodas. Increased water intake. Regular physical activity: walking dog twice daily.  - Educated on monitoring GMI and 90-day average to track estimated A1c. Emphasized adherence to new medication regimen and lifestyle changes for continued improvement.    Orders:    Insulin  Degludec (TRESIBA  FLEXTOUCH) 200 UNIT/ML SOPN; Inject 66 Units into the skin daily    tirzepatide  (MOUNJARO ) 12.5 MG/0.5ML SOAJ injection; Inject 12.5 mg into the skin every 7 days    Tirzepatide  (MOUNJARO ) 15 MG/0.5ML SOAJ pen; Inject 15 mg into the skin every 7 days      Assessment & Plan  Follow-up: 6 weeks on 04/28/2024 at 3:30 PM.    Subjective     S/O: [name], a 63 y.o. 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  Pt was seen today for diabetes management.  Patient's last A1c was:   Hemoglobin A1C   Date Value Ref Range Status   02/16/2024 9.7 (H) 4.8 - 5.6 % Final     Comment:                 Prediabetes: 5.7 - 6.4           Diabetes: >6.4           Glycemic  control for adults with diabetes: <7.0         Interim update: Pt was last seen by me on 09/08/2023.  Per my prior note: [name] A1c is not at goal of < 7%.  [name] most recent Libre 3 AGP report for 14 days (ending 4/13) shows time in target range 16%, average glucose 275 mg/dL, and GMI 0.0%.  Poor adherence to her Humalog  dosing has led to poor prandial control.  Her basal control is very poor, but will focus on the prandial control at this time.  May need to increase her dose of Tresiba  at the next visit.  Will continue her dose titration of Mounjaro  to 7.5mg  weekly x4 weeks, then increase to 10mg  weekly.  Will have her restart use of her Herlene 3 so that her CGM data can be assessed at follow up in 5 weeks.       Pt was lost to follow up.            Today:   History of Present Illness  The patient is a 63 year old female who presents for follow-up regarding diabetes management after a lapse in follow-up since September 08, 2023.    The patient reports significant progress in her condition. She has been utilizing a Libre continuous glucose monitoring device, which was removed last  night due to signal loss over the past five days. She encountered issues with the old application and subsequently created a new account.    The patient has made dietary modifications as per the recommendations of her nutritionist, including the consumption of protein shakes, fruits, and salads, while avoiding fried foods, Skittles, cakes, and sodas. She has increased her water intake and engages in physical activity by walking her dog in the morning and walking in a field in the afternoon.    She has been consistent with her medications, Mounjaro  and Tresiba , receiving them a week in advance and has not missed any doses. She receives four boxes of Tresiba  at a time. The patient reports a weight loss of 12 pounds and improved HbA1c levels. Mounjaro  has positively impacted her bowel movements and appetite, reducing her constant hunger.    She  administers 5 units of Humalog  with each meal if her blood glucose levels exceed 180 mg/dL, although she rarely requires it for breakfast (morning readings range from 68 to 100 mg/dL). She uses 5 units of Humalog  for lunch and dinner and does not require a refill at this time. She has reduced her Tresiba  dose from 70 to 66 units due to morning readings of 55 mg/dL. She continues to administer Mounjaro  at a dose of 10 mg.    Current anti-hyperglycemic regimen includes:    Key Antihyperglycemic Medications              Insulin  Degludec (TRESIBA  FLEXTOUCH) 200 UNIT/ML SOPN (Taking Differently) Inject 80 Units into the skin daily    insulin  lispro, 1 Unit Dial , (HUMALOG  KWIKPEN) 100 UNIT/ML SOPN (Taking Differently) CHECK BLOOD GLUCOSE PRIOR TO MEALS AND AT BEDTIME. TAKE ACCORDING TO SLIDING SCALE FOR BS 180-200 USE 2 UNITS, 201-220 USE 4 UNITS, 221-240 USE 5 UNITS, 241-280 USE 6 UNITS, 281-320 USE 7 UNITS, 321-400 USE 8 UNITS, GREATER THAN 400 CALL DOCTOR    Tirzepatide  (MOUNJARO ) 10 MG/0.5ML SOAJ pen Inject 10 mg into the skin once a week Start after completing the 7.5mg  dose - MOUNJARO           Complete current medication regimen includes:  Current Outpatient Medications   Medication Sig    Insulin  Degludec (TRESIBA  FLEXTOUCH) 200 UNIT/ML SOPN Inject 80 Units into the skin daily (Patient taking differently: Inject 66 Units into the skin daily)    insulin  lispro, 1 Unit Dial , (HUMALOG  KWIKPEN) 100 UNIT/ML SOPN CHECK BLOOD GLUCOSE PRIOR TO MEALS AND AT BEDTIME. TAKE ACCORDING TO SLIDING SCALE FOR BS 180-200 USE 2 UNITS, 201-220 USE 4 UNITS, 221-240 USE 5 UNITS, 241-280 USE 6 UNITS, 281-320 USE 7 UNITS, 321-400 USE 8 UNITS, GREATER THAN 400 CALL DOCTOR (Patient taking differently: Injecting 5 units with each meal and hasn't used the sliding scale)    beclomethasone (QVAR REDIHALER) 80 MCG/ACT AERB inhaler Inhale 2 puffs into the lungs in the morning and 2 puffs in the evening.    triamcinolone  (KENALOG ) 0.1 % cream  APPLY TO AFFECTED AREA TWICE A DAY    pregabalin  (LYRICA ) 200 MG capsule Take 1 capsule by mouth 2 times daily for 180 days. Max Daily Amount: 400 mg    meclizine  (ANTIVERT ) 25 MG tablet TAKE ONE TABLET BY MOUTH THREE TIMES A DAY AS NEEDED FOR DIZZINESS    cyclobenzaprine  (FLEXERIL ) 10 MG tablet TAKE 1 TABLET BY MOUTH THREE TIMES A DAY AS NEEDED FOR MUSCLE SPASMS    potassium chloride  (KLOR-CON  M20) 20 MEQ extended release tablet Take 2 tablets by  mouth daily    Tirzepatide  (MOUNJARO ) 10 MG/0.5ML SOAJ pen Inject 10 mg into the skin once a week Start after completing the 7.5mg  dose - MOUNJARO     montelukast  (SINGULAIR ) 10 MG tablet TAKE 1 TABLET BY MOUTH DAILY TO CONTROL ASTHMA    atorvastatin  (LIPITOR) 20 MG tablet TAKE 1 TABLET BY MOUTH DAILY    lisinopril  (PRINIVIL ;ZESTRIL ) 10 MG tablet TAKE ONE TABLET BY MOUTH EVERY DAY    DULoxetine  (CYMBALTA ) 30 MG extended release capsule Take 1 capsule by mouth 2 times daily    Continuous Glucose Sensor (FREESTYLE LIBRE 3 SENSOR) MISC USE TO MONITOR BLOOD GLUCOSE CONTINUOUSLY - CHANGE EVERY 14 DAYS    magnesium  oxide (MAG-OX) 400 MG tablet Take 2 tablets by mouth 2 times daily    omeprazole  (PRILOSEC  OTC) 20 MG tablet Take 1 tablet by mouth daily    Insulin  Pen Needle 32G X 4 MM MISC 1 each by Does not apply route daily    BIOTIN PO Take 1 tablet by mouth daily    ZINC  PO Take by mouth    albuterol sulfate HFA (PROVENTIL;VENTOLIN;PROAIR) 108 (90 Base) MCG/ACT inhaler Inhale 1 puff into the lungs every 4 hours as needed    albuterol (PROVENTIL) (2.5 MG/3ML) 0.083% nebulizer solution Inhale 3 mLs into the lungs every 4 hours as needed    vitamin D3 (CHOLECALCIFEROL) 125 MCG (5000 UT) TABS tablet Take 1 tablet by mouth daily    diphenhydrAMINE (BENADRYL) 25 MG capsule Take 1 capsule by mouth every 6 hours as needed    Specialty Vitamins Products (MAGNESIUM , AMINO ACID CHELATE,) 133 MG tablet Take 1 tablet by mouth 2 times daily     No current facility-administered medications  for this visit.     Allergies:  Allergies   Allergen Reactions    Penicillins Hives and Itching       Medication Adherence/Access:  - Endorses adherence to current regimen?: Yes    Objective     ROS:  Today, Pt endorses:  - Symptoms of Hyperglycemia: none  - Symptoms of Hypoglycemia: none    Blood Glucose Monitoring (BGM) or CGM:      Results  CGM:  - Time in Range: 69%  - Mean glucose: 157  - Estimated A1c (GMI): 7.1%    Vitals/Labs:  No results found for: HBA1C  Hemoglobin A1C   Date Value Ref Range Status   02/16/2024 9.7 (H) 4.8 - 5.6 % Final     Comment:                 Prediabetes: 5.7 - 6.4           Diabetes: >6.4           Glycemic control for adults with diabetes: <7.0     11/10/2023 9.4 (H) %Hb Final     Comment:     Reference Range:  American Diabetes Association (ADA) Guidelines:  <5.7: Decreased risk for diabetes  5.7 - 6.4: Increased risk for diabetes  >6.4: Ongoing Hyperglycemia of any cause  <7.0: Glycemic control for adults with diabetes     07/31/2023 9.2 (H) 4.8 - 5.6 % Final     Comment:                 Prediabetes: 5.7 - 6.4           Diabetes: >6.4           Glycemic control for adults with diabetes: <7.0  Screenings/Prevention Parameters:  -Diabetic Eye and Foot Exams:      Diabetes Management   Topic Date Due    Diabetic retinal exam  10/20/2021     -Microalbumin / Creatinine ratio:     No results found for: MACKEY ACE     Lab Results   Component Value Date    ALBCREAT <3 02/16/2024      No components found for: MALBCREARAT  -Immunizations:      Immunization History   Administered Date(s) Administered    COVID-19, COMIRNATY [employer]), (age 12y+), IM, 55mcg/0.3mL 02/15/2023    COVID-19, Inactive, MODERNA BLUE border, Primary or Immunocompromised, (age 12y+) 06/16/2019, 07/14/2019    COVID-19, Inactive, MODERNA Bivalent, (age 12y+) 09/13/2021    COVID-19, Inactive, PFIZER Bivalent, DO NOT Dilute, (age 12y+) 01/29/2021    COVID-19, Inactive, PFIZER PURPLE top, DILUTE for  use, (age 77 y+) 03/12/2020, 10/22/2020    COVID-19, SPIKEVAX Laren), (age 12y+), IM, 72mcg/0.5mL 02/18/2022, 07/25/2022    Influenza Trivalent 02/27/2018, 02/23/2019, 03/01/2020    Influenza Virus Vaccine 02/18/2022    Influenza, FLUARIX, FLULAVAL, FLUZONE (age 48 mo+) and AFLURIA, (age 721 y+), Quadv PF, 0.39mL 01/23/2021, 02/15/2023    Influenza, FLUCELVAX, (age 72 mo+) IM, Trivalent PF, 0.61mL 02/18/2024    Pneumococcal, PCV20, PREVNAR 20, (age 72w+), IM, 0.15mL 01/23/2021    Pneumococcal, PPSV23, PNEUMOVAX 23, (age 2y+), SC/IM, 0.29mL 03/18/2019    TDaP, ADACEL (age 49y-64y), BOOSTRIX (age 10y+), IM, 0.3mL 03/18/2019    Zoster Recombinant (Shingrix) 06/11/2021, 09/13/2021       Additional Laboratory Parameters of Interest:   Estimation of renal function:  Lab Results   Component Value Date/Time    LABGLOM 65 02/18/2024 02:39 PM    LABGLOM 60 02/16/2024 12:00 AM    LABGLOM 56 11/10/2023 01:29 PM    LABGLOM 66 06/25/2022 12:00 AM    LABGLOM >60 05/08/2022 01:28 PM    LABGLOM >60 01/21/2022 03:21 PM    LABGLOM 57 05/29/2021 02:59 PM    GFRAA >60 01/12/2021 11:49 AM    GFRAA >60 11/22/2020 11:12 AM    GFRAA >60 08/10/2020 03:38 PM     Wt Readings from Last 3 Encounters:   02/18/24 91.2 kg (201 lb)   11/11/23 93.9 kg (207 lb)   03/20/23 95.7 kg (211 lb)     Ht Readings from Last 1 Encounters:   02/18/24 1.727 m (5' 8)     Calculated estimated creatinine clearance: estimated creatinine clearance is 69 mL/min (based on SCr of 0.98 mg/dL).    Vital Signs Today:   There were no vitals taken for this visit.    Medications Discontinued During This Encounter   Medication Reason    Insulin  Degludec (TRESIBA  FLEXTOUCH) 200 UNIT/ML SOPN DOSE ADJUSTMENT    insulin  lispro, 1 Unit Dial , (HUMALOG  KWIKPEN) 100 UNIT/ML SOPN REORDER    Tirzepatide  (MOUNJARO ) 10 MG/0.5ML SOAJ pen DOSE ADJUSTMENT       Orders Placed This Encounter    Insulin  Degludec (TRESIBA  FLEXTOUCH) 200 UNIT/ML SOPN     Sig: Inject 66 Units into the skin daily      Dispense:  36 mL     Refill:  3    insulin  lispro, 1 Unit Dial , (HUMALOG  KWIKPEN) 100 UNIT/ML SOPN     Sig: Inject 5 Units into the skin 3 times daily (before meals)     Dispense:  15 Adjustable Dose Pre-filled Pen Syringe     Refill:  4    tirzepatide  (MOUNJARO ) 12.5 MG/0.5ML SOAJ  injection     Sig: Inject 12.5 mg into the skin every 7 days     Dispense:  2 mL     Refill:  0    Tirzepatide  (MOUNJARO ) 15 MG/0.5ML SOAJ pen     Sig: Inject 15 mg into the skin every 7 days     Dispense:  2 mL     Refill:  0       Future Appointments   Date Time Provider Department Center   03/23/2024 11:30 AM [facility] BX RM 1 [facility]   03/23/2024 12:00 PM [facility] US  RM 1 [facility]   04/14/2024  3:00 PM [facility] BONE DENSITY RM 1 [facility]   04/28/2024  3:30 PM Lorenz Ozell BIRCH [facility]   06/03/2024 11:45 AM [facility] LAB VISIT [facility]   06/22/2024  2:30 PM Sarris, Heron [name], MD [facility] DE[facility]        Reviewed and updated this visit:  Allergies  Meds         Patient verbalized understanding of the information presented and all of the patients questions were answered.  AVS was handed to the patient. Patient advised to call the office with any additional questions or concerns.    Notifications of recommendations will be sent to Chet Heron [name], MD for review.      Thank you for the consult,  Ozell Lorenz, PharmD, BCACP, BC-ADM        For Pharmacy Admin Tracking Only    Program: Medical Group  CPA in place:  Yes  Recommendation Provided To: Patient/Caregiver: 8 via In person  Intervention Detail: Adherence Monitoring: 1, Dose Adjustment: 3, reason: Therapy Optimization, New Rx: 2, reason: Needs Additional Therapy, Refill(s) Provided, and Scheduled Appointment  Intervention Accepted By: Patient/Caregiver: 8  Gap Closed?: No   Time Spent (min): 30        The patient (or guardian, if applicable) and other individuals in attendance with the patient were advised that Artificial Intelligence will be utilized  during this visit to record, process the conversation to generate a clinical note, and support improvement of the AI technology. The patient (or guardian, if applicable) and other individuals in attendance at the appointment consented to the use of AI, including the recording.                  "

## 2024-03-17 NOTE — Assessment & Plan Note (Deleted)
" {  A/P Summary:[id number]}    Orders:    Insulin  Degludec (TRESIBA  FLEXTOUCH) 200 UNIT/ML SOPN; Inject 66 Units into the skin daily    insulin  lispro, 1 Unit Dial , (HUMALOG  KWIKPEN) 100 UNIT/ML SOPN; Inject 5 Units into the skin 3 times daily (before meals)    "

## 2024-03-23 ENCOUNTER — Inpatient Hospital Stay: Payer: Medicare (Managed Care) | Attending: Internal Medicine | Primary: Internal Medicine

## 2024-03-23 ENCOUNTER — Inpatient Hospital Stay: Payer: Medicare (Managed Care) | Primary: Internal Medicine

## 2024-04-05 ENCOUNTER — Encounter

## 2024-04-05 MED ORDER — FREESTYLE LIBRE 3 SENSOR MISC
3 refills | Status: AC
Start: 2024-04-05 — End: ?

## 2024-04-13 NOTE — Telephone Encounter (Signed)
"  Pt is requesting an appointment because she's been coughing, and having difficulty breathing. Pt has been dealing with this for about 5 weeks now. Pt went to [facility] and she was given a nebulizer, inhaler, and nasal spray. Pt says that none of this is helping. Pt is afraid of being misdiagnosed because it's happened before. An appointment has been scheduled, but pt wanted to let provider know what was happening just in case there's a sooner appointment. Please advise.  "

## 2024-04-13 NOTE — Telephone Encounter (Addendum)
"  Please advise ED evaluation if having difficulty breathing and unable to wait for upcoming visit on 04/15/2024.  They will be able to give IV medications and help her breathing more promptly if needed.  "

## 2024-04-13 NOTE — Telephone Encounter (Signed)
"  Called patient, gave recommendation. Patient states she can wait til her appointment on Thursday.   "

## 2024-04-14 ENCOUNTER — Ambulatory Visit: Payer: Medicare (Managed Care) | Primary: Internal Medicine

## 2024-04-15 ENCOUNTER — Telehealth
Admit: 2024-04-15 | Discharge: 2024-04-15 | Payer: Medicare (Managed Care) | Attending: Internal Medicine | Primary: Internal Medicine

## 2024-04-15 DIAGNOSIS — J4521 Mild intermittent asthma with (acute) exacerbation: Principal | ICD-10-CM

## 2024-04-15 MED ORDER — BENZONATATE 100 MG PO CAPS
100 | ORAL_CAPSULE | Freq: Three times a day (TID) | ORAL | 0 refills | 10.00000 days | Status: AC | PRN
Start: 2024-04-15 — End: 2024-04-25

## 2024-04-15 MED ORDER — PREDNISONE 20 MG PO TABS
20 | ORAL_TABLET | Freq: Every day | ORAL | 0 refills | 5.00000 days | Status: AC
Start: 2024-04-15 — End: 2024-04-20

## 2024-04-15 NOTE — Progress Notes (Signed)
 "04/15/2024    TELEHEALTH EVALUATION -- Audio/Visual    HPI:  [name] Wu (DOB:  Feb 27, 1961) has requested an audio/video evaluation for the following concern(s):  persistent cough.    HPI:   [name] Wu is a 63 y.o. year old female who presents today for a acute visit.  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      She was last seen on 02/18/2024 and reported that she was having some increased difficulty with wheezing and dyspnea for several weeks and acknowledged that she had been using Qvar only as needed.  She was advised to increase Qvar to twice daily with some initial improvement.  However, approximately 5 weeks ago, she noted increasing nasal congestion, coughing, and difficulty breathing.  Due to significant worsening, she presented to [facility] on 04/07/2024 and was prescribed Symbicort inhaler and Flonase as well as provided with a new nebulizer machine.  However, she states today that she has not noted improvement and feels that she has cotton in her chest and is unable to cough it up.  She is noted to have persistent coughing as she is attempting to speak and is unable to complete sentences without coughing.  She denies any fever, chills, or purulent sputum production.  She states that her coughing is interfering with sleeping at night.     She completed a follow-up visit with Pharm.D. Dail on 03/17/2024 and was noted to have significant improvement in her diabetes control.  Review of CGM data showed time in target range at 69% with average glucose 157 and GMI of 7.1%.  Her regimen was adjusted with decrease in Tresiba  to 66 units daily, discontinuation of Humalog  sliding scale and starting 5 units with each meal, and titration of tirzepatide  to 12.5 mg and 15 mg weekly over the next 8 weeks.  She discusses today that she has achieved a 22 pound  weight loss since her last visit with improvement in her average glucose.  She is scheduled for follow-up with Pharm.D. Dail on 04/28/2024.  She states today that she is very pleased with her progress and reports a goal weight of 160 pounds.    She missed her mammogram appointment on 03/23/2024 but is now rescheduled for both a mammogram and bone density study on 05/28/2024.      She is otherwise without new complaints.        Summary of prior hospitalizations and medical history:   On 06/10/2023, she established care with Dr. 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  Extensive lab evaluation was performed and recommendation was to consider a bone marrow biopsy with MRD pending lab results.  It was also recommended that she undergo evaluation by pulmonary for PFT testing given her stem cell transplant, and she was referred to Dr. Angelyn at [facility].  She reported that she was scheduled on 08/22/2023, but did not keep the appointment.  It was also recommended that she stay up-to-date on her health maintenance screening testing, and she was urged to obtain a mammogram and Pap smear. She no-showed for her mammogram appointment on 05/05/2023 and her well woman exam with NP Nicholaus on 07/29/2023.  She missed her follow-up visit with Dr. Dorrie on 09/16/2023.    She has a history of acute lymphoblastic leukemia, diagnosed in 02/2017 after experiencing severe fatigue and bone pain for several  months. She was positive for the Philadelphia chromosome mutation. She presented to the [facility] in [place] for care, and underwent chemotherapy which achieved remission. She subsequently underwent a stem cell transplant on 08/14/2017 which was felt to be curative. She had been on maintenance therapy with a tyrosine kinase inhibitor, nilotimib 200 mg bid. She had been followed by Dr. Randie Hake , transplant oncologist, and had been traveling to [facility]  every three months for care. She had a follow-up visit with Dr. Hake in 12/2020 and treatment with nilotinib was discontinued and follow-up recommended on an annual basis.      He has a history of hypertension, treated with amlodipine. She has a history of hyperlipidemia, and was restarted on atorvastatin  in 07/2019.  She also has a history of diabetes mellitus, currently being treated with Tresiba , Humalog , and Ozempic . She denies any polyuria, polydipsia, nocturia, or blurry vision, and has no history of retinopathy or nephropathy. 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 She was under the care of pain management and being treated with Cymbalta  and Roxicodone as needed, but has noted a significant decrease in the need for narcotics since being started on pregabalin  in 02/2020.     She has a history of asthma since childhood, and is being maintained on Flovent and albuterol as needed. She denies any cough or shortness of breath.      In 04/2018, she was admitted to [facility] from 12/4-12/10/2017 for a UTI and then subsequently readmitted from 12/6-12/17/2019 with MRSA bacteremia related to a PICC line and bibasilar pneumonia.      He has a history of GERD, treated with Protonix. She also underwent a screening colonoscopy on 05/14/2018 by Dr. Elma at the [facility] in [place] to evaluate diarrhea post stem cell transplant. No lesions were found. She denies any abdominal pain, nausea, vomiting, melena, hematochezia, or change in bowel movements.       Past Medical History:   Diagnosis Date    ALL (acute lymphoblastic leukemia) (HCC) 02/2017    s/p stem cell transplant 08/2017; [facility] in [place]    Diabetes 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)     H/O stem cell transplant (HCC) 041/04/19    Hyperlipidemia 03/22/2019    Hypertension      Insomnia 03/22/2019    Mild intermittent asthma without complication 03/18/2019    Peripheral neuropathy due to chemotherapy 03/18/2019     Past Surgical History:   Procedure Laterality Date    XR MIDLINE EQUAL OR GREATER THAN 5 YEARS  04/22/2018    XR MIDLINE EQUAL OR GREATER THAN 5 YEARS 04/22/2018     Current Outpatient Medications   Medication Sig    predniSONE  (DELTASONE ) 20 MG tablet Take 2 tablets by mouth daily for 5 days    benzonatate  (TESSALON ) 100 MG capsule Take 1 capsule by mouth 3 times daily as needed for Cough    Continuous Glucose Sensor (FREESTYLE LIBRE 3 SENSOR) MISC USE TO MONITOR BLOOD GLUCOSE CONTINUOUSLY CHANGE EVERY 14 DAYS    Insulin  Degludec (TRESIBA  FLEXTOUCH) 200 UNIT/ML SOPN Inject 66 Units into the skin daily    insulin  lispro, 1 Unit Dial , (HUMALOG  KWIKPEN) 100 UNIT/ML SOPN Inject 5 Units into the skin 3 times daily (before meals)    tirzepatide  (MOUNJARO ) 12.5 MG/0.5ML  SOAJ injection Inject 12.5 mg into the skin every 7 days    Tirzepatide  (MOUNJARO ) 15 MG/0.5ML SOAJ pen Inject 15 mg into the skin every 7 days    beclomethasone (QVAR REDIHALER) 80 MCG/ACT AERB inhaler Inhale 2 puffs into the lungs in the morning and 2 puffs in the evening.    triamcinolone  (KENALOG ) 0.1 % cream APPLY TO AFFECTED AREA TWICE A DAY    pregabalin  (LYRICA ) 200 MG capsule Take 1 capsule by mouth 2 times daily for 180 days. Max Daily Amount: 400 mg    meclizine  (ANTIVERT ) 25 MG tablet TAKE ONE TABLET BY MOUTH THREE TIMES A DAY AS NEEDED FOR DIZZINESS    cyclobenzaprine  (FLEXERIL ) 10 MG tablet TAKE 1 TABLET BY MOUTH THREE TIMES A DAY AS NEEDED FOR MUSCLE SPASMS    potassium chloride  (KLOR-CON  M20) 20 MEQ extended release tablet Take 2 tablets by mouth daily    montelukast  (SINGULAIR ) 10 MG tablet TAKE 1 TABLET BY MOUTH DAILY TO CONTROL ASTHMA    atorvastatin  (LIPITOR) 20 MG tablet TAKE 1 TABLET BY MOUTH DAILY    lisinopril  (PRINIVIL ;ZESTRIL ) 10 MG tablet TAKE ONE TABLET BY MOUTH EVERY DAY    DULoxetine   (CYMBALTA ) 30 MG extended release capsule Take 1 capsule by mouth 2 times daily    magnesium  oxide (MAG-OX) 400 MG tablet Take 2 tablets by mouth 2 times daily    omeprazole  (PRILOSEC  OTC) 20 MG tablet Take 1 tablet by mouth daily    Insulin  Pen Needle 32G X 4 MM MISC 1 each by Does not apply route daily    BIOTIN PO Take 1 tablet by mouth daily    ZINC  PO Take by mouth    albuterol sulfate HFA (PROVENTIL;VENTOLIN;PROAIR) 108 (90 Base) MCG/ACT inhaler Inhale 1 puff into the lungs every 4 hours as needed    albuterol (PROVENTIL) (2.5 MG/3ML) 0.083% nebulizer solution Inhale 3 mLs into the lungs every 4 hours as needed    vitamin D3 (CHOLECALCIFEROL) 125 MCG (5000 UT) TABS tablet Take 1 tablet by mouth daily    diphenhydrAMINE (BENADRYL) 25 MG capsule Take 1 capsule by mouth every 6 hours as needed    Specialty Vitamins Products (MAGNESIUM , AMINO ACID CHELATE,) 133 MG tablet Take 1 tablet by mouth 2 times daily     No current facility-administered medications for this visit.     Allergies and Intolerances:   Allergies   Allergen Reactions    Penicillins Hives and Itching     Family History:  She has no family history of colon cancer of leukemia. Mother had breast cancer at the age of 79.   Family History   Problem Relation Age of Onset    Breast Cancer Mother      Social History:   She  reports that she has never smoked. She has never used smokeless tobacco. She is divorced and has no children. She is employed as a research scientist (medical).  Social History     Substance and Sexual Activity   Alcohol Use No     Immunization History:  Immunization History   Administered Date(s) Administered    COVID-19, COMIRNATY Proofreader), (age 12y+), IM, 30mcg/0.3mL 02/15/2023    COVID-19, Inactive, MODERNA BLUE border, Primary or Immunocompromised, (age 12y+) 06/16/2019, 07/14/2019    COVID-19, Inactive, MODERNA Bivalent, (age 12y+) 09/13/2021    COVID-19, Inactive, PFIZER Bivalent, DO NOT Dilute, (age 12y+) 01/29/2021    COVID-19, Inactive, PFIZER  PURPLE top, DILUTE for use, (age 71 y+) 03/12/2020, 10/22/2020  COVID-19, SPIKEVAX Laren), (age 12y+), IM, 89mcg/0.5mL 02/18/2022, 07/25/2022    Influenza Trivalent 02/27/2018, 02/23/2019, 03/01/2020    Influenza Virus Vaccine 02/18/2022    Influenza, FLUARIX, FLULAVAL, FLUZONE (age 48 mo+) and AFLURIA, (age 80 y+), Quadv PF, 0.54mL 01/23/2021, 02/15/2023    Influenza, FLUCELVAX, (age 48 mo+) IM, Trivalent PF, 0.5mL 02/18/2024    Pneumococcal, PCV20, PREVNAR 20, (age 48w+), IM, 0.46mL 01/23/2021    Pneumococcal, PPSV23, PNEUMOVAX 23, (age 2y+), SC/IM, 0.5mL 03/18/2019    TDaP, ADACEL (age 29y-64y), MYRTICE (age 10y+), IM, 0.5mL 03/18/2019    Zoster Recombinant (Shingrix) 06/11/2021, 09/13/2021       Review of Systems:   As above included in HPI.  Otherwise 11 point review of systems negative including constitutional, skin, HENT, eyes, respiratory, cardiovascular, gastrointestinal, genitourinary, musculoskeletal, endocrine, hematologic, allergy, and neurologic.    Physical:   There were no vitals filed for this visit.        Exam:   PHYSICAL EXAMINATION:  Constitutional: [x]  Appears well-developed and well-nourished []  No apparent distress      []  Abnormal-   Mental status  [x]  Alert and awake  [x]  Oriented to person/place/time [] Able to follow commands      Eyes:  EOM    [x]   Normal  []  Abnormal-  Sclera  [x]   Normal  []  Abnormal -         Discharge [x]   None visible  []  Abnormal -    HENT:   [x]  Normocephalic, atraumatic.  []  Abnormal   []  Mouth/Throat: Mucous membranes are moist.     External Ears [x]  Normal  []  Abnormal-     Neck: [x]  No visualized mass     Pulmonary/Chest: [x]  Respiratory effort normal.  []  No visualized signs of difficulty breathing or respiratory distress        [x]  Abnormal-frequent coughing interfering with conversation and completing sentences.     Musculoskeletal:   []  Normal gait with no signs of ataxia         [x]  Normal range of motion of neck        []  Abnormal-       Neurological:         [x]  No Facial Asymmetry (Cranial nerve 7 motor function) (limited exam to video visit)          [x]  No gaze palsy        []  Abnormal-         Skin:        [x]  No significant exanthematous lesions or discoloration noted on facial skin         []  Abnormal-            Psychiatric:       [x]  Normal Affect []  No Hallucinations        []  Abnormal-     Other pertinent observable physical exam findings-         Review of Data:  Labs:  No visits with results within 2 Day(s) from this visit.   Latest known visit with results is:   Hospital Outpatient Visit on 02/16/2024   Component Date Value Ref Range Status    [facility] Specimen Collection 02/16/2024 Specimens collected/sent to [facility]. Please direct inquiries to [phone]).    Final     Lab Results   Component Value Date    WBC 8.6 02/16/2024    HGB 13.4 02/16/2024    HCT 40.8 02/16/2024    MCV 97 02/16/2024    PLT 221  02/16/2024     Lab Results   Component Value Date    NA 139 02/18/2024    K 5.0 02/18/2024    CL 99 02/18/2024    CO2 26 02/18/2024    BUN 7 (L) 02/18/2024    CREATININE 0.98 02/18/2024    GLUCOSE 299 (H) 02/18/2024    CALCIUM  9.3 02/18/2024    LABALBU <3.0 06/25/2022    BILITOT 0.5 02/18/2024    ALKPHOS 115 02/18/2024    AST 19 02/18/2024    ALT 16 02/18/2024    LABGLOM 65 02/18/2024    GFRAA >60 01/12/2021    AGRATIO 1.8 06/25/2022    GLOB 3.0 05/08/2022       Lab Results   Component Value Date    CHOL 124 02/18/2024    TRIG 189 (H) 02/18/2024    HDL 42 02/18/2024    LDL 51 02/18/2024    VLDL 31 02/18/2024    CHOLHDLRATIO 3.1 05/08/2022     Lab Results   Component Value Date    ALBCREAT <3 02/16/2024     Lab Results   Component Value Date/Time    MG 1.9 02/18/2024 02:39 PM     Lab Results   Component Value Date/Time    VITD25 38.2 02/16/2024 12:00 AM      Hemoglobin A1C   Date Value Ref Range Status   02/16/2024 9.7 (H) 4.8 - 5.6 % Final     Comment:                 Prediabetes: 5.7 - 6.4           Diabetes: >6.4           Glycemic control for adults  with diabetes: <7.0       Urinalysis (02/16/2024) showed 3+ leukocyte esterase and 11-30 WBCs with calcium  oxalate crystals.       Health Maintenance:  Screening:               Mammogram: Right mammo/ultrasound negative (10/2020); Due in 01/2021  OVERDUE. Scheduled 05/28/2024.              PAP smear: well woman exam with NP Gretel (Pap/HPV negative on 02/15/2020) OVERDUE              Colorectal: colonoscopy (05/14/2018) normal.  Dr. Elma in [place].  Due 05/2028.              Depression: none              DM (HbA1c/FPG): FPG 168/HbA1c 9.7 (02/2024)              Hepatitis C: negative (03/2019)              Falls: none              DEXA: Scheduled 05/28/2024              Glaucoma: regular eye exams with Dr. Iacobucci (last 10/2020)  OVERDUE.  Scheduled.              Smoking: none              Vitamin D: 38.2 (02/2024)              Medicare Wellness: 11/11/2023       Impression:  Patient Active Problem List   Diagnosis    Peripheral neuropathy due to chemotherapy    Acute lymphoblastic leukemia (ALL) in remission [facility])    Essential hypertension  Type 2 diabetes mellitus with diabetic neuropathy, with long-term current use of insulin  (HCC)    S/P bone marrow transplant (HCC)    Eczema    Elevated alkaline phosphatase level    Primary osteoarthritis involving multiple joints    GERD (gastroesophageal reflux disease)    Vestibular neuronitis    Hyperlipidemia    Class 1 obesity due to excess calories with serious comorbidity in adult    Mild intermittent asthma    Noncompliance    Stage 3 chronic kidney disease (HCC)    Type 2 diabetes mellitus with chronic kidney disease, with long-term current use of insulin  (HCC)    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  Developed upper respiratory viral infection in 03/2024 with increased dyspnea and cough not responsive to Qvar or albuterol.  Presented to [facility] on 04/07/2024 and prescribed Symbicort and Flonase and also  given a new nebulizer machine.  However, reports cough is persisting and having difficulty completing sentences without coughing.  Denies any fever, chills, sputum production, or purulent nasal drainage.  Admits that coughing is interfering with sleep at night.  Already using inhaled ICS LABA, montelukast , Flonase, and Zyrtec as well as nebulizer.  Will prescribe prednisone  40 mg daily for 5 days and benzonatate  for cough.  Advised to continue Symbicort, Flonase, Zyrtec, and montelukast .  Instructed to call if not improving.        Chronic issues:  1. Diabetes mellitus. Previously well controlled on regimen of Tresiba  and SS Humalog  with HbA1c 6.5 in 03/2019. However, progressively worsened due to weight gain and difficulty obtaining insulin . Acquired Freestyle libre 3 CGM and established care with Pharm.D. Dail but not been consistent with follow-up. Reports currently on Tresiba  70 units daily, sliding scale Humalog , and Mounjaro  10 mg weekly with HbA1c worsened to 9.7 (02/2024).  Review of CGM data shows TIR at 35%  over the last month with TBR 0%.  Completed a follow-up with Pharm.D. Dail (03/17/2024 and was noted to have significant improvement in her diabetes control with TIR 69% and GMI of 7.1%.  Regimen adjusted to Tresiba  66 units daily, Humalog  5 units with each meal, and titration of tirzepatide  to 15 mg weekly.  Patient reports 22 pound weight loss today and noting significant improvement in her diabetes control.  Scheduled for follow-up with Pharm.D. Dail on 04/28/2024.  No evidence of microvascular complications. On statin and on lisinopril . Continue regular eye exams with Dr. Iacobucci.  Overdue and reports now scheduled.  Foot exam grossly normal (02/2024), and no evidence of albuminuria (< 3 mg/g). Emphasized importance of lifestyle modifications, including heart healthy low carbohydrate diet, regular exercise, and weight loss.  Will reassess HbA1c at next visit.  2. Acute lymphoblastic leukemia,  +Philadelphia chromosone mutation, s/p stem cell transplant, in remission. Diagnosed in 02/2017 and completed induction chemotherapy followed by stem cell transplant in 08/2017.  Had been on maintenance therapy with a TKI (nilotinib), but discontinued in 12/2020 by her oncologist.  Had been followed closely by Dr. Randie Hake, transplant oncologist at [facility] in [place].  Reported frequency of follow-up had been decreased to annually but unable to keep appointment in 12/2022 due to insurance change.  Referred to Dr. Dorrie on 06/10/2023 and extensive lab evaluation performed.  Advised that considering bone marrow biopsy with MRD pending lab results but did not keep follow-up appointment with Dr. Dorrie on 09/16/2023 to review  results.  Urged to reschedule..  3. Hypertension. Blood pressure has been well controlled on lisinopril  10 mg daily today. Renal function stable (02/2024) at 1.05/eGFR 60 consistent with chronic renal disease.  Worsening renal function likely related to uncontrolled diabetes. On potassium and magnesium  supplements due to long term difficulty with deficiency and levels remain stable today. Will continue to monitor.  4. Hyperlipidemia.  On moderate intensity dose atorvastatin  with LDL 51 and HDL 42 (02/2024), indicative of excellent control.  Goal LDL <70 given diabetes mellitus. Emphasized importance of lifestyle modifications, including heart healthy low carbohydrate diet, regular exercise, and weight loss. Continue to follow.  5. Peripheral neuropathy, painful. Secondary to chemotherapy used to treat ALL. Did not tolerate gabapentin due to hallucinations. On Cymbalta  30 mg bid and started on pregabalin  in 02/2021 with good effect.  Previously on pregabalin  150 mg twice daily but reported only taking nightly.  Dose of pregabalin  increased to 200 mg twice daily with better control.  Advised to continue using Flexeril  as needed for cramping.  Overall improved today.  6. Asthma,  mild intermittent.  Noting significant improvement since initiation of Singulair  10 mg daily following exacerbation in 03/2021. Referred by Dr. Dorrie to establish care with Dr. Angelyn for PFT testing given her stem cell transplant, but did not keep scheduled appointment 08/22/2023.  Also recommended sleep evaluation due to ongoing fatigue, body habitus, and worsening restless leg symptoms.  Urged to reschedule.  Reported increasing symptoms at last visit in 02/2024 and advised to resume maintenance therapy Qvar twice daily. However, developed viral infection with acute exacerbation as discussed.  Maintenance therapy changed to Symbicort and further management as outlined.  7. GERD. On Protonix 40 mg bid with good control. Follow.  8.  Chronic renal disease, stage 2-3.  Noted intermittent elevation of creatinine since 05/2021 with recent baseline ranging 1.07-1.11/eGFR 56-59.  Worsening function likely related to hyperglycemia due to poorly controlled blood sugar.  On ACE inhibitor and statin.  Will consider addition of SGLT2 inhibitor at next visit.  Stressed importance of blood sugar control in order to avoid further progression.  Advised to maintain good fluid intake and avoid NSAIDs.  Also noted abnormal urinalysis but patient denying any symptoms of infection.  Will obtain repeat urinalysis and culture.  Discussed finding of calcium  oxalate crystals in urine and urged a low oxalate diet and high fluid intake to prevent nephrolithiasis.  Will consider renal ultrasound for any change in renal function.  Will continue to monitor.  9. Elevated alkaline phosphatase. GGT elevated at 97 so likely of hepatic origin (04/2020). AMA negative (04/2020) and alkaline phosphatase isozymes with normal differential and predominant hepatic origin (04/2020).  Right upper quadrant ultrasound ordered but patient never obtained. Repeat alkaline phosphatase normalized today at 108. Suspect improvement related to discontinuation of  nilotinib. Will continue to monitor.  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  Reported increasing difficulty with vertigo and stated that she had experienced multiple falls. Continuing difficulty with disequilibrium.  Referred to balance therapy in 01/2021 and again in 05/2021 but never scheduled due to cost. Advised use of meclizine  as needed.  Reports overall improvement in symptoms today.  11. Insomnia. Using Ambien  +/- Benadryl as needed. Emphasized good sleep hygiene.  Continue to monitor.  12. Eczema. On Xyzal and will use Kenalog  as needed.  Reports recent exacerbation requiring course of steroids.  13. Abnormal mammogram. Patient reports had previous biopsy of right breast which was benign. Screening mammogram (02/2019) showed multiple abnormalities in right breast. Right  breast diagnostic mammogram and ultrasound (05/12/2019) showed probably benign findings, but recommendation was to proceed with 6 month follow-up. Underwent repeat diagnostic right mammogram and right breast ultrasound in 10/2020 and findings felt to be benign. Recommended continuing with annual follow-up and due for bilateral screening in 02/2021.  Order again placed on 08/01/2023 for bilateral mammogram and right breast ultrasound and now scheduled on 05/28/2024.  Urged to keep appointment.  Patient with FH history of breast cancer in her mother.  14. Obesity.  Reports weight decreased a total of 22 pounds since 03/2023 with titration of tirzepatide  to 15 mg weekly.  Emphasized importance of continuing with attempts lifestyle modifications, including heart healthy low carbohydrate diet, regular exercise, and weight loss.  15. Health maintenance. Completed 4 doses of the COVID 19 vaccine series due to immunosuppressed status and the bivalent Pfizer booster dose.  Completed Prevnar 20 and Shingrix vaccine series.  Discussed recommended vaccines for fall, including influenza vaccine, updated COVID vaccine, and new RSV vaccine.  Will give influenza vaccine  today.  Other immunizations up-to-date.  Bilateral mammogram due in 02/2021 and reports scheduled.  Also scheduled for bone density study.  Urged to keep appointments.  Colonoscopy completed in [place] on 05/14/2018.SABRA  Completed well woman exam with NP Allena Gleason and chart updated, but due for follow-up with NP Nicholaus.  Also completed eye exam with Dr. Karolyn urged to keep appointment.  Vitamin D level remains normal on maintenance dose supplement. Emphasized importance of lifestyle modifications, including heart healthy diet, regular exercise, and weight loss. Medicare wellness visit up-to-date.     Patient understands recommendations and agrees with plan.  Follow-up as previously scheduled.      Felisa L Ahner, was evaluated through a synchronous (real-time) audio-video encounter. The patient (or guardian if applicable) is aware that this is a billable service, which includes applicable co-pays. This Virtual Visit was conducted with patient's (and/or legal guardian's) consent. Patient identification was verified, and a caregiver was present when appropriate.   The patient was located at Other: In car  Provider was located at [employer] (Appt Dept): [address] [place], [address]  [place],  [place] [address]  Confirm you are appropriately licensed, registered, or certified to deliver care in the state where the patient is located as indicated above. If you are not or unsure, please re-schedule the visit: Yes, I confirm.       Total time spent on this encounter: 35 minutes    --Heron CHRISTELLA Gentleman, MD on 04/15/2024 at 5:42 PM    An electronic signature was used to authenticate this note.    "

## 2024-04-15 NOTE — Progress Notes (Signed)
"  Rosaline LITTIE Amsler presents today for   Chief Complaint   Patient presents with    Cold Symptoms       Have you been to the ER, urgent care clinic since your last visit?  Hospitalized since your last visit?    04/07/2024  [facility]  Coughing, difficultly breathing    Have you seen or consulted any other health care providers outside of [facility] since your last visit?    NO       Have you had a mammogram?   NO    Date of last Mammogram: 10/27/2020          "

## 2024-04-19 ENCOUNTER — Encounter

## 2024-04-19 MED ORDER — ZOLPIDEM TARTRATE 5 MG PO TABS
5 | ORAL_TABLET | Freq: Every evening | ORAL | 0 refills | Status: AC | PRN
Start: 2024-04-19 — End: 2024-05-19

## 2024-04-19 NOTE — Telephone Encounter (Signed)
"  Will need UDS/CSA at next visit.  Thanks  "

## 2024-04-19 NOTE — Telephone Encounter (Signed)
"  PCP: Chet Heron [name], MD    LAST OFFICE VISIT: 03/17/2024    [place] P[place]  The last fill date was 02/02/2024 for a 30 d/s qty 30      LAST REFILL PER CHART:  Medication:zolpidem  (AMBIEN ) 5 MG tablet   Ordered On:02/02/2024  Instructions:Take 1 tablet by mouth nightly as needed for Sleep for up to 30 days. Max Daily Amount: 5 mg   Dispense:30 tablets  Refills:0    Future Appointments   Date Time Provider Department Center   04/28/2024  3:30 PM Lorenz Ozell [facility] ECC DEP   05/28/2024  1:30 PM HBV BONE DEXA RM 1 [facility] [place]   05/28/2024  2:00 PM HBV MAM RM 1 2D HBVRMAM [place]   05/28/2024  2:30 PM HBV MAM US  RM 1 [facility]RMAM [place]   06/03/2024 11:45 AM IOC LAB VISIT [facility] ECC DEP   06/22/2024  2:30 PM Sarris, Heron [name], MD [facility] ECC DEP       "

## 2024-04-22 NOTE — Telephone Encounter (Signed)
"  Attempted to call patient, no answer, left voicemail to return call.    "

## 2024-04-22 NOTE — Telephone Encounter (Signed)
"  Patient returned call.    [phone]   "

## 2024-04-23 NOTE — Telephone Encounter (Signed)
 Called patient, she does not need a refill.

## 2024-04-25 ENCOUNTER — Encounter

## 2024-04-26 MED ORDER — MOUNJARO 15 MG/0.5ML SC SOAJ
15 | SUBCUTANEOUS | 3 refills | Status: AC
Start: 2024-04-26 — End: ?

## 2024-04-26 NOTE — Telephone Encounter (Signed)
"  PCP: Chet Heron [name], MD    LAST OFFICE VISIT: 03/17/2024    LAST REFILL PER CHART:  Medication:Tirzepatide  (MOUNJARO ) 15 MG/0.5ML SOAJ pen   Ordered On:03/31/2024  Instructions:Inject 15 mg into the skin every 7 days   Dispense:35mL  Refills:0    Future Appointments   Date Time Provider Department Center   04/28/2024  3:30 PM Lorenz Ozell [facility] ECC DEP   05/28/2024  1:30 PM HBV BONE DEXA RM 1 [facility]RBD [place]   05/28/2024  2:00 PM HBV MAM RM 1 2D HBVRMAM [place]   05/28/2024  2:30 PM HBV MAM US  RM 1 HBVRMAM [place]   06/03/2024 11:45 AM IOC LAB VISIT [facility] ECC DEP   06/22/2024  2:30 PM Sarris, Heron [name], MD [facility] ECC DEP       "

## 2024-04-28 ENCOUNTER — Ambulatory Visit: Payer: Medicare (Managed Care) | Primary: Internal Medicine

## 2024-04-28 NOTE — Progress Notes (Deleted)
 "    Pharmacy Progress Note - Diabetes Management    Assessment & Plan      Assessment & Plan      Subjective     S/O: [name], a 63 y.o. 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  Pt was seen today for diabetes management.  Patient's last A1c was:   Hemoglobin A1C   Date Value Ref Range Status   02/16/2024 9.7 (H) 4.8 - 5.6 % Final     Comment:                 Prediabetes: 5.7 - 6.4           Diabetes: >6.4           Glycemic control for adults with diabetes: <7.0         Interim update: Pt was last seen by me on 03/17/2024.  Per my prior note: [name] A1c is not at goal of < 7%.  - Management significantly improved. Time in target range 69%, average glucose 157, estimated A1c (GMI) 7.1% for past 2 weeks. Last month, A1c 9.7%.  - Tresiba  66 units daily. Humalog  sliding scale discontinued; consistent 5 units with meals. Increase Mounjaro  to 12.5 mg for 4 weeks, then 15 mg weekly. Prescriptions sent to pharmacy.  - Changed diet per nutritionist: protein shakes, fruits, salads; eliminating fried foods, sweets, sodas. Increased water intake. Regular physical activity: walking dog twice daily.  - Educated on monitoring GMI and 90-day average to track estimated A1c. Emphasized adherence to new medication regimen and lifestyle changes for continued improvement.    Today:   History of Present Illness      Current anti-hyperglycemic regimen includes:    Key Antihyperglycemic Medications              MOUNJARO  15 MG/0.5ML SOAJ pen INJECT 15 MG INTO THE SKIN EVERY 7 DAYS    Insulin  Degludec (TRESIBA  FLEXTOUCH) 200 UNIT/ML SOPN Inject 66 Units into the skin daily    insulin  lispro, 1 Unit  Dial , (HUMALOG  KWIKPEN) 100 UNIT/ML SOPN Inject 5 Units into the skin 3 times daily (before meals)          Complete current medication regimen includes:  Current Outpatient Medications   Medication Sig    MOUNJARO  15 MG/0.5ML SOAJ pen INJECT 15 MG INTO THE SKIN EVERY 7 DAYS    zolpidem  (AMBIEN ) 5 MG tablet Take 1 tablet by mouth nightly as needed for Sleep for up to 30 days. Max Daily Amount: 5 mg    Continuous Glucose Sensor (FREESTYLE LIBRE 3 SENSOR) MISC USE TO MONITOR BLOOD GLUCOSE CONTINUOUSLY CHANGE EVERY 14 DAYS    Insulin  Degludec (TRESIBA  FLEXTOUCH) 200 UNIT/ML SOPN Inject 66 Units into the skin daily    insulin  lispro, 1 Unit Dial , (HUMALOG  KWIKPEN) 100 UNIT/ML SOPN Inject 5 Units into the skin 3 times daily (before meals)    beclomethasone (QVAR REDIHALER) 80 MCG/ACT AERB inhaler Inhale 2 puffs into the lungs in the morning and 2 puffs in the evening.    triamcinolone  (KENALOG ) 0.1 % cream APPLY TO AFFECTED AREA TWICE A DAY    pregabalin  (LYRICA ) 200 MG capsule Take 1 capsule by mouth 2 times daily for 180 days.  Max Daily Amount: 400 mg    meclizine  (ANTIVERT ) 25 MG tablet TAKE ONE TABLET BY MOUTH THREE TIMES A DAY AS NEEDED FOR DIZZINESS    cyclobenzaprine  (FLEXERIL ) 10 MG tablet TAKE 1 TABLET BY MOUTH THREE TIMES A DAY AS NEEDED FOR MUSCLE SPASMS    potassium chloride  (KLOR-CON  M20) 20 MEQ extended release tablet Take 2 tablets by mouth daily    montelukast  (SINGULAIR ) 10 MG tablet TAKE 1 TABLET BY MOUTH DAILY TO CONTROL ASTHMA    atorvastatin  (LIPITOR) 20 MG tablet TAKE 1 TABLET BY MOUTH DAILY    lisinopril  (PRINIVIL ;ZESTRIL ) 10 MG tablet TAKE ONE TABLET BY MOUTH EVERY DAY    DULoxetine  (CYMBALTA ) 30 MG extended release capsule Take 1 capsule by mouth 2 times daily    magnesium  oxide (MAG-OX) 400 MG tablet Take 2 tablets by mouth 2 times daily    omeprazole  (PRILOSEC  OTC) 20 MG tablet Take 1 tablet by mouth daily    Insulin  Pen Needle 32G X 4 MM MISC 1 each by Does not apply route daily    BIOTIN PO  Take 1 tablet by mouth daily    ZINC  PO Take by mouth    albuterol sulfate HFA (PROVENTIL;VENTOLIN;PROAIR) 108 (90 Base) MCG/ACT inhaler Inhale 1 puff into the lungs every 4 hours as needed    albuterol (PROVENTIL) (2.5 MG/3ML) 0.083% nebulizer solution Inhale 3 mLs into the lungs every 4 hours as needed    vitamin D3 (CHOLECALCIFEROL) 125 MCG (5000 UT) TABS tablet Take 1 tablet by mouth daily    diphenhydrAMINE (BENADRYL) 25 MG capsule Take 1 capsule by mouth every 6 hours as needed    Specialty Vitamins Products (MAGNESIUM , AMINO ACID CHELATE,) 133 MG tablet Take 1 tablet by mouth 2 times daily     No current facility-administered medications for this visit.     Allergies:  Allergies   Allergen Reactions    Penicillins Hives and Itching       Medication Adherence/Access:  - Endorses adherence to current regimen?: {YES/NO:19726}    Objective     ROS:  Today, Pt endorses:  - {Symptoms of Hyperglycemia:17903:::1}  - {Symptoms of Hypoglycemia:17902:::1}    Blood Glucose Monitoring (BGM) or CGM:  - Had access to home glucometer/blood glucose log/CGM reader today:  {YES/NO:19726}  - Conducts/scans: ***   - Fasting BG today: ***  - Post-prandial:   -FBG: ***   -ac lunch: ***    -ac dinner: ***   -hs: ***    Results      Vitals/Labs:  No results found for: HBA1C  Hemoglobin A1C   Date Value Ref Range Status   02/16/2024 9.7 (H) 4.8 - 5.6 % Final     Comment:                 Prediabetes: 5.7 - 6.4           Diabetes: >6.4           Glycemic control for adults with diabetes: <7.0     11/10/2023 9.4 (H) %Hb Final     Comment:     Reference Range:  American Diabetes Association (ADA) Guidelines:  <5.7: Decreased risk for diabetes  5.7 - 6.4: Increased risk for diabetes  >6.4: Ongoing Hyperglycemia of any cause  <7.0: Glycemic control for adults with diabetes     07/31/2023 9.2 (H) 4.8 - 5.6 % Final     Comment:  Prediabetes: 5.7 - 6.4           Diabetes: >6.4           Glycemic control for adults with  diabetes: <7.0         Screenings/Prevention Parameters:  -Diabetic Eye and Foot Exams:      Diabetes Management   Topic Date Due    Diabetic retinal exam  10/20/2021    A1C test (Diabetic or Prediabetic)  05/18/2024     -Microalbumin / Creatinine ratio:     No results found for: MACKEY ACE     Lab Results   Component Value Date    ALBCREAT <3 02/16/2024      No components found for: MALBCREARAT  -Immunizations:      Immunization History   Administered Date(s) Administered    COVID-19, COMIRNATY [employer]), (age 12y+), IM, 29mcg/0.3mL 02/15/2023    COVID-19, Inactive, MODERNA BLUE border, Primary or Immunocompromised, (age 12y+) 06/16/2019, 07/14/2019    COVID-19, Inactive, MODERNA Bivalent, (age 12y+) 09/13/2021    COVID-19, Inactive, PFIZER Bivalent, DO NOT Dilute, (age 12y+) 01/29/2021    COVID-19, Inactive, PFIZER PURPLE top, DILUTE for use, (age 690 y+) 03/12/2020, 10/22/2020    COVID-19, SPIKEVAX Laren), (age 12y+), IM, 73mcg/0.5mL 02/18/2022, 07/25/2022    Influenza Trivalent 02/27/2018, 02/23/2019, 03/01/2020    Influenza Virus Vaccine 02/18/2022    Influenza, FLUARIX, FLULAVAL, FLUZONE (age 54 mo+) and AFLURIA, (age 694 y+), Quadv PF, 0.50mL 01/23/2021, 02/15/2023    Influenza, FLUCELVAX, (age 69 mo+) IM, Trivalent PF, 0.51mL 02/18/2024    Pneumococcal, PCV20, PREVNAR 20, (age 69w+), IM, 0.73mL 01/23/2021    Pneumococcal, PPSV23, PNEUMOVAX 23, (age 2y+), SC/IM, 0.29mL 03/18/2019    TDaP, ADACEL (age 699y-64y), BOOSTRIX (age 10y+), IM, 0.2mL 03/18/2019    Zoster Recombinant (Shingrix) 06/11/2021, 09/13/2021       Additional Laboratory Parameters of Interest:   Estimation of renal function:  Lab Results   Component Value Date/Time    LABGLOM 65 02/18/2024 02:39 PM    LABGLOM 60 02/16/2024 12:00 AM    LABGLOM 56 11/10/2023 01:29 PM    LABGLOM 66 06/25/2022 12:00 AM    LABGLOM >60 05/08/2022 01:28 PM    LABGLOM >60 01/21/2022 03:21 PM    LABGLOM 57 05/29/2021 02:59 PM    GFRAA >60 01/12/2021 11:49 AM     GFRAA >60 11/22/2020 11:12 AM    GFRAA >60 08/10/2020 03:38 PM     Wt Readings from Last 3 Encounters:   02/18/24 91.2 kg (201 lb)   11/11/23 93.9 kg (207 lb)   03/20/23 95.7 kg (211 lb)     Ht Readings from Last 1 Encounters:   02/18/24 1.727 m (5' 8)     Calculated estimated creatinine clearance: CrCl cannot be calculated (Unknown ideal weight.).    Vital Signs Today:   There were no vitals taken for this visit.    There are no discontinued medications.    No orders of the defined types were placed in this encounter.      Future Appointments   Date Time Provider Department Center   04/28/2024  3:30 PM Lorenz Ozell [facility] ECC DEP   05/28/2024  1:30 PM HBV BONE DEXA RM 1 [facility]RBD [place]   05/28/2024  2:00 PM HBV MAM RM 1 2D [facility]RMAM [place]   05/28/2024  2:30 PM HBV MAM US  RM 1 HBVRMAM [place]   06/03/2024 11:45 AM IOC LAB VISIT [facility] ECC DEP   06/22/2024  2:30  PM Sarris, Heron [name], MD [facility] ECC DEP        Reviewed and updated this visit:         Patient verbalized understanding of the information presented and all of the patients questions were answered.  AVS was handed to the patient. Patient advised to call the office with any additional questions or concerns.    Notifications of recommendations will be sent to Chet Heron [name], MD for review.      Thank you for the consult,  Ozell Hilding, PharmD, BCACP, BC-ADM        {Common Amb Care/Retail Pharmacy Notes:[id number]}        The patient (or guardian, if applicable) and other individuals in attendance with the patient were advised that Artificial Intelligence will be utilized during this visit to record, process the conversation to generate a clinical note, and support improvement of the AI technology. The patient (or guardian, if applicable) and other individuals in attendance at the appointment consented to the use of AI, including the recording.                  "

## 2024-05-28 ENCOUNTER — Ambulatory Visit: Payer: Medicare (Managed Care) | Primary: Internal Medicine

## 2024-05-28 ENCOUNTER — Inpatient Hospital Stay: Payer: Medicare (Managed Care) | Primary: Internal Medicine

## 2024-06-02 ENCOUNTER — Inpatient Hospital Stay: Payer: Medicare (Managed Care) | Attending: Internal Medicine | Primary: Internal Medicine

## 2024-06-02 ENCOUNTER — Ambulatory Visit: Payer: Medicare (Managed Care) | Primary: Internal Medicine

## 2024-06-02 ENCOUNTER — Inpatient Hospital Stay: Payer: Medicare (Managed Care) | Primary: Internal Medicine

## 2024-06-03 ENCOUNTER — Encounter: Payer: Medicare (Managed Care) | Primary: Internal Medicine

## 2024-06-03 ENCOUNTER — Ambulatory Visit: Payer: Medicare (Managed Care) | Primary: Internal Medicine

## 2024-06-04 NOTE — Telephone Encounter (Signed)
 Attempt made to contact patient to switch in person visit on 06/07/24 at 3:00 pm with Michael Dail to a VV or reschedule for a different day due to possible weather conditions.    Unable to LVM-MB not set up  [employer] to call [phone]    [facility]  Clinical Pharmacy Technician Support Specialist  [facility]  [phone] or [phone] Option #2      For Pharmacy Admin Tracking Only    Program: Medical Group  Gap Closed?: No   Time Spent (min): 5

## 2024-06-07 ENCOUNTER — Telehealth: Admit: 2024-06-07 | Discharge: 2024-06-07 | Primary: Internal Medicine

## 2024-06-07 DIAGNOSIS — Z794 Long term (current) use of insulin: Principal | ICD-10-CM

## 2024-06-07 NOTE — Progress Notes (Signed)
 Pharmacy Progress Note - Diabetes Management    Assessment & Plan  Type 2 diabetes mellitus with hyperglycemia, with long-term current use of insulin  (HCC)   Per ADA guidelines, [name] A1c is not at goal of < 7%.  - Blood glucose consistently above 200 since 4 AM. AGP: 56% in range, 32% high, 11% very high, mean glucose 176, GMI 7.5%. 90-day AGP: mean glucose 156, GMI 7%. Three-month average A1c 7%, recent 14-day A1c 7.5%, improved from 9.7% in 02/2024.  - Inconsistent Humalog  doses, Tresiba  and Mounjaro  mostly on track. No regimen changes.  - Stress management crucial for blood sugar control. Discussed importance of consistent insulin  administration and diet adherence.    Assessment & Plan  Follow-up: 07/05/2024 at 3:00 PM.    Subjective     S/O: [name], a 65 y.o. 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  Pt was seen today virtually for diabetes management.  Patient's last A1c was:   Hemoglobin A1C   Date Value Ref Range Status   02/16/2024 9.7 (H) 4.8 - 5.6 % Final     Comment:                 Prediabetes: 5.7 - 6.4           Diabetes: >6.4           Glycemic control for adults with diabetes: <7.0       Hemoglobin A1C, External   Date Value Ref Range Status   04/18/2018 6.2 (H) 4.8 - 5.6 % Final       Interim update: Pt was last seen by me on 03/17/2024.  Per my prior note: [name] A1c is not at goal of < 7%.  - Management significantly improved. Time in target range 69%, average glucose 157, estimated A1c (GMI) 7.1% for past 2 weeks. Last month, A1c 9.7%.  - Tresiba  66 units daily. Humalog  sliding scale discontinued; consistent 5 units with meals. Increase Mounjaro  to  12.5 mg for 4 weeks, then 15 mg weekly. Prescriptions sent to pharmacy.  - Changed diet per nutritionist: protein shakes, fruits, salads; eliminating fried foods, sweets, sodas. Increased water intake. Regular physical activity: walking dog twice daily.  - Educated on monitoring GMI and 90-day average to track estimated A1c. Emphasized adherence to new medication regimen and lifestyle changes for continued improvement.    Today:   History of Present Illness  The patient is a 64 year old female presenting for evaluation of diabetes mellitus.    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 She is unable to adhere to her previous dietary and insulin  strategies, resulting in a plateau in weight loss.    Her morning routine includes consumption of lemon water, followed by water, and a breakfast consisting of two slices of bacon and one egg. For lunch, she consumes a salad with one slice of bread and either tuna fish or a chicken wing. She expresses a need for lifestyle modifications due to the negative impact on her emotional and mental health, which has led to insomnia, irregular sleep patterns, and inconsistent diabetes management.    Her hemoglobin A1c has increased from 6.5% to 7.4%  within two months post-consultation, which she attributes to dietary changes rather than missed doses of Tresiba  or Mounjaro . She admits to inconsistent use of Humalog , noting that her diet influences her insulin  administration. Her blood glucose levels are reported to be above 350 mg/dL at 3:99 AM, decreasing by 9:00 AM, and increasing again after consuming a [employer] breakfast without administering Humalog  and with a delayed dose of Tresiba . She acknowledges the need for stress reduction to improve her diabetes management.    Diet Recall:  Breakfast: Two slices of bacon and one egg  Lunch: Salad with one slice of  bread and either tuna fish or a chicken wing  Beverages: Lemon water  Takeout: [employer] breakfast    Current anti-hyperglycemic regimen includes:    Key Antihyperglycemic Medications              MOUNJARO  15 MG/0.5ML SOAJ pen INJECT 15 MG INTO THE SKIN EVERY 7 DAYS    Insulin  Degludec (TRESIBA  FLEXTOUCH) 200 UNIT/ML SOPN Inject 66 Units into the skin daily    insulin  lispro, 1 Unit Dial , (HUMALOG  KWIKPEN) 100 UNIT/ML SOPN Inject 5 Units into the skin 3 times daily (before meals)          Complete current medication regimen includes:  Current Outpatient Medications   Medication Sig    MOUNJARO  15 MG/0.5ML SOAJ pen INJECT 15 MG INTO THE SKIN EVERY 7 DAYS    Continuous Glucose Sensor (FREESTYLE LIBRE 3 SENSOR) MISC USE TO MONITOR BLOOD GLUCOSE CONTINUOUSLY CHANGE EVERY 14 DAYS    Insulin  Degludec (TRESIBA  FLEXTOUCH) 200 UNIT/ML SOPN Inject 66 Units into the skin daily    insulin  lispro, 1 Unit Dial , (HUMALOG  KWIKPEN) 100 UNIT/ML SOPN Inject 5 Units into the skin 3 times daily (before meals)    beclomethasone (QVAR REDIHALER) 80 MCG/ACT AERB inhaler Inhale 2 puffs into the lungs in the morning and 2 puffs in the evening.    triamcinolone  (KENALOG ) 0.1 % cream APPLY TO AFFECTED AREA TWICE A DAY    pregabalin  (LYRICA ) 200 MG capsule Take 1 capsule by mouth 2 times daily for 180 days. Max Daily Amount: 400 mg    meclizine  (ANTIVERT ) 25 MG tablet TAKE ONE TABLET BY MOUTH THREE TIMES A DAY AS NEEDED FOR DIZZINESS    cyclobenzaprine  (FLEXERIL ) 10 MG tablet TAKE 1 TABLET BY MOUTH THREE TIMES A DAY AS NEEDED FOR MUSCLE SPASMS    potassium chloride  (KLOR-CON  M20) 20 MEQ extended release tablet Take 2 tablets by mouth daily    montelukast  (SINGULAIR ) 10 MG tablet TAKE 1 TABLET BY MOUTH DAILY TO CONTROL ASTHMA    atorvastatin  (LIPITOR) 20 MG tablet TAKE 1 TABLET BY MOUTH DAILY    lisinopril  (PRINIVIL ;ZESTRIL ) 10 MG tablet TAKE ONE TABLET BY MOUTH EVERY DAY    DULoxetine  (CYMBALTA ) 30 MG extended release capsule Take 1 capsule by  mouth 2 times daily    magnesium  oxide (MAG-OX) 400 MG tablet Take 2 tablets by mouth 2 times daily    omeprazole  (PRILOSEC  OTC) 20 MG tablet Take 1 tablet by mouth daily    Insulin  Pen Needle 32G X 4 MM MISC 1 each by Does not apply route daily    BIOTIN PO Take 1 tablet by mouth daily    ZINC  PO Take by mouth    albuterol sulfate HFA (PROVENTIL;VENTOLIN;PROAIR) 108 (90 Base) MCG/ACT inhaler Inhale 1 puff into the lungs every 4 hours as needed    albuterol (PROVENTIL) (2.5 MG/3ML) 0.083% nebulizer solution Inhale 3 mLs into the  lungs every 4 hours as needed    vitamin D3 (CHOLECALCIFEROL) 125 MCG (5000 UT) TABS tablet Take 1 tablet by mouth daily    diphenhydrAMINE (BENADRYL) 25 MG capsule Take 1 capsule by mouth every 6 hours as needed    Specialty Vitamins Products (MAGNESIUM , AMINO ACID CHELATE,) 133 MG tablet Take 1 tablet by mouth 2 times daily     No current facility-administered medications for this visit.     Allergies:  Allergies   Allergen Reactions    Penicillins Hives and Itching       Medication Adherence/Access:  - Endorses adherence to current regimen?: No    Objective     ROS:  Today, Pt endorses:  - Symptoms of Hyperglycemia: none  - Symptoms of Hypoglycemia: none    Blood Glucose Monitoring (BGM) or CGM:      Results  CGM:  - Time in Range: 56%  - Time Above Range: 32%  - Mean glucose: 176 mg/dL  - GMI: 2.4% (14 days), 7% (90 days)    Vitals/Labs:  No results found for: HBA1C  Hemoglobin A1C   Date Value Ref Range Status   02/16/2024 9.7 (H) 4.8 - 5.6 % Final     Comment:                 Prediabetes: 5.7 - 6.4           Diabetes: >6.4           Glycemic control for adults with diabetes: <7.0     11/10/2023 9.4 (H) %Hb Final     Comment:     Reference Range:  American Diabetes Association (ADA) Guidelines:  <5.7: Decreased risk for diabetes  5.7 - 6.4: Increased risk for diabetes  >6.4: Ongoing Hyperglycemia of any cause  <7.0: Glycemic control for adults with diabetes     07/31/2023 9.2 (H) 4.8  - 5.6 % Final     Comment:                 Prediabetes: 5.7 - 6.4           Diabetes: >6.4           Glycemic control for adults with diabetes: <7.0         Screenings/Prevention Parameters:  -Diabetic Eye and Foot Exams:      Diabetes Management   Topic Date Due    Diabetic retinal exam  10/20/2021    A1C test (Diabetic or Prediabetic)  05/18/2024     -Microalbumin / Creatinine ratio:     No results found for: MACKEY ACE     Lab Results   Component Value Date    ALBCREAT <3 02/16/2024      No components found for: MALBCREARAT  -Immunizations:      Immunization History   Administered Date(s) Administered    COVID-19, COMIRNATY [employer]), (age 12y+), IM, 53mcg/0.3mL 02/15/2023    COVID-19, Inactive, MODERNA BLUE border, Primary or Immunocompromised, (age 12y+) 06/16/2019, 07/14/2019    COVID-19, Inactive, MODERNA Bivalent, (age 12y+) 09/13/2021    COVID-19, Inactive, PFIZER Bivalent, DO NOT Dilute, (age 12y+) 01/29/2021    COVID-19, Inactive, PFIZER PURPLE top, DILUTE for use, (age 38 y+) 03/12/2020, 10/22/2020    COVID-19, SPIKEVAX Laren), (age 12y+), IM, 59mcg/0.5mL 02/18/2022, 07/25/2022    COVID-19, [place]  Vaccine, Vaccine Unspecified 05/17/2024    Influenza Trivalent 02/27/2018, 02/23/2019, 03/01/2020    Influenza Virus Vaccine 02/18/2022    Influenza, FLUARIX, FLULAVAL, FLUZONE (age 42 mo+) and  AFLURIA, (age 71 y+), Quadv PF, 0.55mL 01/23/2021, 02/15/2023    Influenza, FLUCELVAX, (age 14 mo+) IM, Trivalent PF, 0.5mL 02/18/2024    Pneumococcal, PCV20, PREVNAR 20, (age 14w+), IM, 0.27mL 01/23/2021    Pneumococcal, PPSV23, PNEUMOVAX 23, (age 2y+), SC/IM, 0.63mL 03/18/2019    RSV (Respiratory Syncytial Virus), unspecified vaccine or mAB 05/17/2024    TDaP, ADACEL (age 143y-64y), BOOSTRIX (age 10y+), IM, 0.19mL 03/18/2019    Zoster Recombinant (Shingrix) 06/11/2021, 09/13/2021       Additional Laboratory Parameters of Interest:   Estimation of renal function:  Lab Results   Component Value Date/Time    LABGLOM  65 02/18/2024 02:39 PM    LABGLOM 60 02/16/2024 12:00 AM    LABGLOM 56 11/10/2023 01:29 PM    LABGLOM 66 06/25/2022 12:00 AM    LABGLOM >60 05/08/2022 01:28 PM    LABGLOM >60 01/21/2022 03:21 PM    LABGLOM 57 05/29/2021 02:59 PM    GFRAA >60 01/12/2021 11:49 AM    GFRAA >60 11/22/2020 11:12 AM    GFRAA >60 08/10/2020 03:38 PM     Wt Readings from Last 3 Encounters:   02/18/24 91.2 kg (201 lb)   11/11/23 93.9 kg (207 lb)   03/20/23 95.7 kg (211 lb)     Ht Readings from Last 1 Encounters:   02/18/24 1.727 m (5' 8)     Calculated estimated creatinine clearance: CrCl cannot be calculated (Unknown ideal weight.).    Vital Signs Today:   There were no vitals taken for this visit.    There are no discontinued medications.    No orders of the defined types were placed in this encounter.      Future Appointments   Date Time Provider Department Center   06/09/2024 11:30 AM IOC LAB VISIT [facility] E[facility] DEP   06/22/2024  2:30 PM Chet Heron [name], MD [facility] E[facility] DEP   07/05/2024  3:00 PM Lorenz Ozell BIRCH [facility] DEP        Reviewed and updated this visit:  Allergies  Meds         Patient verbalized understanding of the information presented and all of the patients questions were answered.  AVS was gone over with the patient. Patient advised to call the office with any additional questions or concerns.    Notifications of recommendations will be sent to Chet Heron [name], MD for review.      Thank you for the consult,  Ozell Lorenz, PharmD, BCACP, BC-ADM        For Pharmacy Admin Tracking Only    Program: Medical Group  CPA in place:  Yes  Recommendation Provided To: Patient/Caregiver: 2 via Virtual Visit  Intervention Detail: Adherence Monitoring: 1 and Scheduled Appointment  Intervention Accepted By: Patient/Caregiver: 2  Gap Closed?: No   Time Spent (min): 30        The patient (or guardian, if applicable) and other individuals in attendance with the patient were advised that Artificial Intelligence will  be utilized during this visit to record, process the conversation to generate a clinical note, and support improvement of the AI technology. The patient (or guardian, if applicable) and other individuals in attendance at the appointment consented to the use of AI, including the recording.

## 2024-06-09 ENCOUNTER — Ambulatory Visit: Payer: Medicare (Managed Care) | Primary: Internal Medicine

## 2024-06-10 NOTE — Telephone Encounter (Signed)
 Patient has office visit on 06/22/2024. Needs labs prior.

## 2024-06-14 NOTE — Telephone Encounter (Signed)
 Pt scheduled

## 2024-06-15 ENCOUNTER — Ambulatory Visit: Payer: Medicare (Managed Care) | Primary: Internal Medicine

## 2024-06-17 ENCOUNTER — Ambulatory Visit: Payer: Medicare (Managed Care) | Primary: Internal Medicine

## 2024-06-22 ENCOUNTER — Ambulatory Visit: Payer: Medicare (Managed Care) | Attending: Internal Medicine | Primary: Internal Medicine

## 2024-07-05 ENCOUNTER — Ambulatory Visit: Payer: Medicare (Managed Care) | Primary: Internal Medicine
